# Patient Record
Sex: Female | Born: 1970
Health system: Southern US, Community
[De-identification: ages and names within clinical notes are randomized; demographics above are authoritative.]

## PROBLEM LIST (undated history)

## (undated) DIAGNOSIS — K5792 Diverticulitis of intestine, part unspecified, without perforation or abscess without bleeding: Secondary | ICD-10-CM

## (undated) DIAGNOSIS — G4730 Sleep apnea, unspecified: Secondary | ICD-10-CM

## (undated) DIAGNOSIS — F329 Major depressive disorder, single episode, unspecified: Secondary | ICD-10-CM

## (undated) DIAGNOSIS — R51 Headache: Secondary | ICD-10-CM

## (undated) DIAGNOSIS — R112 Nausea with vomiting, unspecified: Secondary | ICD-10-CM

## (undated) DIAGNOSIS — G709 Myoneural disorder, unspecified: Secondary | ICD-10-CM

## (undated) DIAGNOSIS — N289 Disorder of kidney and ureter, unspecified: Secondary | ICD-10-CM

## (undated) DIAGNOSIS — R0602 Shortness of breath: Secondary | ICD-10-CM

## (undated) DIAGNOSIS — D649 Anemia, unspecified: Secondary | ICD-10-CM

## (undated) DIAGNOSIS — Z9889 Other specified postprocedural states: Secondary | ICD-10-CM

## (undated) DIAGNOSIS — F419 Anxiety disorder, unspecified: Secondary | ICD-10-CM

## (undated) DIAGNOSIS — F32A Depression, unspecified: Secondary | ICD-10-CM

## (undated) DIAGNOSIS — N186 End stage renal disease: Secondary | ICD-10-CM

## (undated) DIAGNOSIS — M1990 Unspecified osteoarthritis, unspecified site: Secondary | ICD-10-CM

## (undated) DIAGNOSIS — K2970 Gastritis, unspecified, without bleeding: Secondary | ICD-10-CM

## (undated) DIAGNOSIS — M65072 Abscess of tendon sheath, left ankle and foot: Secondary | ICD-10-CM

## (undated) DIAGNOSIS — F988 Other specified behavioral and emotional disorders with onset usually occurring in childhood and adolescence: Secondary | ICD-10-CM

## (undated) DIAGNOSIS — J40 Bronchitis, not specified as acute or chronic: Secondary | ICD-10-CM

## (undated) DIAGNOSIS — Z5189 Encounter for other specified aftercare: Secondary | ICD-10-CM

## (undated) DIAGNOSIS — I10 Essential (primary) hypertension: Secondary | ICD-10-CM

## (undated) DIAGNOSIS — J189 Pneumonia, unspecified organism: Secondary | ICD-10-CM

## (undated) DIAGNOSIS — K219 Gastro-esophageal reflux disease without esophagitis: Secondary | ICD-10-CM

## (undated) DIAGNOSIS — R42 Dizziness and giddiness: Secondary | ICD-10-CM

## (undated) DIAGNOSIS — E669 Obesity, unspecified: Secondary | ICD-10-CM

## (undated) DIAGNOSIS — IMO0001 Reserved for inherently not codable concepts without codable children: Secondary | ICD-10-CM

## (undated) HISTORY — DX: Essential (primary) hypertension: I10

## (undated) HISTORY — DX: Major depressive disorder, single episode, unspecified: F32.9

## (undated) HISTORY — DX: Anxiety disorder, unspecified: F41.9

## (undated) HISTORY — DX: Dizziness and giddiness: R42

## (undated) HISTORY — PX: DILATION AND CURETTAGE OF UTERUS: SHX78

## (undated) HISTORY — DX: Depression, unspecified: F32.A

## (undated) HISTORY — DX: Anemia, unspecified: D64.9

## (undated) HISTORY — DX: Headache: R51

## (undated) HISTORY — DX: Sleep apnea, unspecified: G47.30

## (undated) HISTORY — DX: Gastro-esophageal reflux disease without esophagitis: K21.9

## (undated) HISTORY — PX: WISDOM TOOTH EXTRACTION: SHX21

## (undated) HISTORY — DX: Reserved for inherently not codable concepts without codable children: IMO0001

## (undated) HISTORY — DX: Unspecified osteoarthritis, unspecified site: M19.90

## (undated) HISTORY — PX: UPPER GASTROINTESTINAL ENDOSCOPY: SHX188

## (undated) HISTORY — DX: Other specified behavioral and emotional disorders with onset usually occurring in childhood and adolescence: F98.8

---

## 1998-08-30 ENCOUNTER — Ambulatory Visit (HOSPITAL_COMMUNITY): Admission: RE | Admit: 1998-08-30 | Discharge: 1998-08-30 | Payer: Self-pay | Admitting: Family Medicine

## 1998-08-30 ENCOUNTER — Encounter: Payer: Self-pay | Admitting: Family Medicine

## 1998-09-24 ENCOUNTER — Encounter (HOSPITAL_COMMUNITY): Admission: RE | Admit: 1998-09-24 | Discharge: 1998-12-23 | Payer: Self-pay | Admitting: Psychiatry

## 1998-10-09 ENCOUNTER — Inpatient Hospital Stay (HOSPITAL_COMMUNITY): Admission: AD | Admit: 1998-10-09 | Discharge: 1998-10-10 | Payer: Self-pay | Admitting: Specialist

## 1999-12-04 ENCOUNTER — Emergency Department (HOSPITAL_COMMUNITY): Admission: EM | Admit: 1999-12-04 | Discharge: 1999-12-04 | Payer: Self-pay | Admitting: *Deleted

## 1999-12-07 ENCOUNTER — Encounter: Admission: RE | Admit: 1999-12-07 | Discharge: 1999-12-07 | Payer: Self-pay | Admitting: Gastroenterology

## 1999-12-07 ENCOUNTER — Encounter: Payer: Self-pay | Admitting: Gastroenterology

## 1999-12-22 ENCOUNTER — Encounter: Payer: Self-pay | Admitting: Nephrology

## 1999-12-22 ENCOUNTER — Encounter: Admission: RE | Admit: 1999-12-22 | Discharge: 1999-12-22 | Payer: Self-pay | Admitting: Nephrology

## 2000-01-11 ENCOUNTER — Ambulatory Visit: Admission: RE | Admit: 2000-01-11 | Discharge: 2000-01-11 | Payer: Self-pay | Admitting: Family Medicine

## 2000-02-04 ENCOUNTER — Emergency Department (HOSPITAL_COMMUNITY): Admission: EM | Admit: 2000-02-04 | Discharge: 2000-02-04 | Payer: Self-pay

## 2000-02-16 ENCOUNTER — Encounter: Admission: RE | Admit: 2000-02-16 | Discharge: 2000-02-16 | Payer: Self-pay | Admitting: Family Medicine

## 2000-02-16 ENCOUNTER — Encounter: Payer: Self-pay | Admitting: Family Medicine

## 2000-06-06 ENCOUNTER — Encounter: Admission: RE | Admit: 2000-06-06 | Discharge: 2000-06-06 | Payer: Self-pay | Admitting: Orthopedic Surgery

## 2000-06-06 ENCOUNTER — Encounter: Payer: Self-pay | Admitting: Orthopedic Surgery

## 2000-06-13 ENCOUNTER — Encounter: Admission: RE | Admit: 2000-06-13 | Discharge: 2000-06-13 | Payer: Self-pay | Admitting: Orthopedic Surgery

## 2000-06-13 ENCOUNTER — Encounter: Payer: Self-pay | Admitting: Orthopedic Surgery

## 2001-03-27 ENCOUNTER — Encounter: Admission: RE | Admit: 2001-03-27 | Discharge: 2001-03-27 | Payer: Self-pay | Admitting: Orthopedic Surgery

## 2001-03-27 ENCOUNTER — Encounter: Payer: Self-pay | Admitting: Orthopedic Surgery

## 2001-04-01 HISTORY — PX: OTHER SURGICAL HISTORY: SHX169

## 2001-04-11 ENCOUNTER — Encounter: Payer: Self-pay | Admitting: Orthopedic Surgery

## 2001-04-14 ENCOUNTER — Encounter: Payer: Self-pay | Admitting: Orthopedic Surgery

## 2001-04-14 ENCOUNTER — Encounter (INDEPENDENT_AMBULATORY_CARE_PROVIDER_SITE_OTHER): Payer: Self-pay | Admitting: Specialist

## 2001-04-15 ENCOUNTER — Inpatient Hospital Stay (HOSPITAL_COMMUNITY): Admission: EM | Admit: 2001-04-15 | Discharge: 2001-04-17 | Payer: Self-pay | Admitting: Orthopedic Surgery

## 2001-06-10 ENCOUNTER — Encounter: Payer: Self-pay | Admitting: Emergency Medicine

## 2001-06-10 ENCOUNTER — Emergency Department (HOSPITAL_COMMUNITY): Admission: EM | Admit: 2001-06-10 | Discharge: 2001-06-10 | Payer: Self-pay | Admitting: Emergency Medicine

## 2001-10-30 ENCOUNTER — Encounter: Payer: Self-pay | Admitting: Orthopedic Surgery

## 2001-10-30 ENCOUNTER — Encounter: Admission: RE | Admit: 2001-10-30 | Discharge: 2001-10-30 | Payer: Self-pay | Admitting: Orthopedic Surgery

## 2002-06-15 ENCOUNTER — Emergency Department (HOSPITAL_COMMUNITY): Admission: EM | Admit: 2002-06-15 | Discharge: 2002-06-15 | Payer: Self-pay | Admitting: Emergency Medicine

## 2002-06-25 ENCOUNTER — Other Ambulatory Visit: Admission: RE | Admit: 2002-06-25 | Discharge: 2002-06-25 | Payer: Self-pay | Admitting: Obstetrics and Gynecology

## 2002-11-01 HISTORY — PX: SPINE SURGERY: SHX786

## 2002-12-05 ENCOUNTER — Ambulatory Visit: Admission: RE | Admit: 2002-12-05 | Discharge: 2002-12-05 | Payer: Self-pay | Admitting: Internal Medicine

## 2002-12-14 ENCOUNTER — Encounter: Admission: RE | Admit: 2002-12-14 | Discharge: 2002-12-14 | Payer: Self-pay | Admitting: Internal Medicine

## 2002-12-14 ENCOUNTER — Encounter: Payer: Self-pay | Admitting: Internal Medicine

## 2003-09-24 ENCOUNTER — Other Ambulatory Visit: Admission: RE | Admit: 2003-09-24 | Discharge: 2003-09-24 | Payer: Self-pay | Admitting: Obstetrics and Gynecology

## 2004-03-16 ENCOUNTER — Inpatient Hospital Stay (HOSPITAL_COMMUNITY): Admission: AD | Admit: 2004-03-16 | Discharge: 2004-03-20 | Payer: Self-pay | Admitting: Nephrology

## 2004-03-16 ENCOUNTER — Encounter (INDEPENDENT_AMBULATORY_CARE_PROVIDER_SITE_OTHER): Payer: Self-pay | Admitting: *Deleted

## 2004-04-01 HISTORY — PX: RENAL BIOPSY: SHX156

## 2004-04-15 ENCOUNTER — Encounter (INDEPENDENT_AMBULATORY_CARE_PROVIDER_SITE_OTHER): Payer: Self-pay | Admitting: Specialist

## 2004-04-16 ENCOUNTER — Inpatient Hospital Stay (HOSPITAL_COMMUNITY): Admission: RE | Admit: 2004-04-16 | Discharge: 2004-04-17 | Payer: Self-pay | Admitting: Urology

## 2004-07-02 ENCOUNTER — Ambulatory Visit (HOSPITAL_COMMUNITY): Admission: RE | Admit: 2004-07-02 | Discharge: 2004-07-02 | Payer: Self-pay | Admitting: Urology

## 2009-09-08 ENCOUNTER — Ambulatory Visit: Payer: Self-pay | Admitting: Surgery

## 2009-10-22 ENCOUNTER — Ambulatory Visit (HOSPITAL_COMMUNITY): Admission: RE | Admit: 2009-10-22 | Discharge: 2009-10-22 | Payer: Self-pay | Admitting: Surgery

## 2009-10-22 ENCOUNTER — Ambulatory Visit: Payer: Self-pay | Admitting: Vascular Surgery

## 2009-11-28 ENCOUNTER — Ambulatory Visit: Payer: Self-pay | Admitting: Vascular Surgery

## 2009-12-16 ENCOUNTER — Ambulatory Visit: Payer: Self-pay | Admitting: Vascular Surgery

## 2010-06-21 ENCOUNTER — Emergency Department (HOSPITAL_COMMUNITY): Admission: EM | Admit: 2010-06-21 | Discharge: 2010-06-21 | Payer: Self-pay | Admitting: Emergency Medicine

## 2011-01-13 ENCOUNTER — Encounter (INDEPENDENT_AMBULATORY_CARE_PROVIDER_SITE_OTHER): Payer: Medicare Other

## 2011-01-13 ENCOUNTER — Ambulatory Visit (INDEPENDENT_AMBULATORY_CARE_PROVIDER_SITE_OTHER): Payer: Medicare Other | Admitting: Vascular Surgery

## 2011-01-13 DIAGNOSIS — N186 End stage renal disease: Secondary | ICD-10-CM

## 2011-01-13 DIAGNOSIS — Z01810 Encounter for preprocedural cardiovascular examination: Secondary | ICD-10-CM

## 2011-01-14 LAB — CBC
Hemoglobin: 10.9 g/dL — ABNORMAL LOW (ref 12.0–15.0)
MCH: 28.8 pg (ref 26.0–34.0)
RBC: 3.79 MIL/uL — ABNORMAL LOW (ref 3.87–5.11)
RDW: 14.1 % (ref 11.5–15.5)

## 2011-01-14 LAB — COMPREHENSIVE METABOLIC PANEL
ALT: 16 U/L (ref 0–35)
AST: 15 U/L (ref 0–37)
Albumin: 3.1 g/dL — ABNORMAL LOW (ref 3.5–5.2)
BUN: 39 mg/dL — ABNORMAL HIGH (ref 6–23)
Calcium: 9.3 mg/dL (ref 8.4–10.5)
GFR calc non Af Amer: 13 mL/min — ABNORMAL LOW (ref >60)
Total Bilirubin: 0.2 mg/dL — ABNORMAL LOW (ref 0.3–1.2)
Total Protein: 6 g/dL (ref 6.0–8.3)

## 2011-01-14 LAB — PREGNANCY, URINE: Preg Test, Ur: NEGATIVE

## 2011-01-14 LAB — URINALYSIS, ROUTINE W REFLEX MICROSCOPIC
Glucose, UA: 100 mg/dL — AB
Leukocytes, UA: NEGATIVE
Protein, ur: >300 mg/dL — AB

## 2011-01-14 LAB — DIFFERENTIAL
Basophils Absolute: 0 10*3/uL (ref 0.0–0.1)
Basophils Relative: 0 % (ref 0–1)
Neutro Abs: 9.4 10*3/uL — ABNORMAL HIGH (ref 1.7–7.7)
Neutrophils Relative %: 73 % (ref 43–77)

## 2011-01-14 LAB — URINE MICROSCOPIC-ADD ON

## 2011-01-14 NOTE — Assessment & Plan Note (Signed)
OFFICE VISIT  [name], [name] DOB:  [id number]                                       01/13/2011 [id number]  I saw the patient in the office today in consultation for further hemodialysis access.  This is a pleasant 40 year old woman with a history of FSGS and a solitary kidney who is not yet on dialysis.  She had a left radiocephalic fistula placed in December 2010.  She was seen in February 2011, at which time it was noted that her forearm fistula was occluded.  It was felt that there were no options to revise this fistula, and the next recommendation at that time was a left upper arm AV fistula.  I was explained to her that the vein in the upper arm was fairly deep and that she may require secondary procedures in order to make this accessible for access.  She did not want to proceed at that time and was then lost to follow-up.  She was sent back to the office today by Dr. Hassell Done to reconsider a new access.  Of note, she is right- handed.  She has had some recent fatigue but has had no other uremic symptoms. She denies any nausea, vomiting, shortness of breath, or palpitations. It is felt that she will likely require dialysis in the next 2 months.  PAST MEDICAL HISTORY:  Significant for hypertension and chronic kidney disease.  She denies any history of diabetes, hypercholesterolemia, history of previous myocardial infarction, or history congestive heart failure.  She does have morbid obesity.  SOCIAL HISTORY:  She is single.  She has 1 child.  She does not use tobacco.  REVIEW OF SYSTEMS:  CARDIOVASCULAR:  She has had no chest pain, chest pressure, palpitations or arrhythmias. PULMONARY:  She does have occasional wheezing and asthma. HEMATOLOGIC:  She has a history of anemia. She has had no recent uremic symptoms.  Specifically, she denies any nausea, vomiting, palpitations, fatigue, or shortness of breath.  PHYSICAL EXAMINATION:  This is a  pleasant 40 year old woman who appears her stated age.  She is morbidly obese.  Blood pressure is 120/81, heart rate is 71, respiratory rate is 24.  Lungs are clear bilaterally to auscultation.  Cardiovascular:  I do not detect any carotid bruits.  She has a regular rate and rhythm.  She has palpable brachial and radial pulses bilaterally.  Abdomen is obese and difficult to assess.  I do not see a usable forearm cephalic vein or upper arm cephalic vein on either side.  On the left side, her fistula is occluded.  Neurologic:  She has no focal weakness or paresthesias.  I have independently interpreted her vein mapping today, which shows that her upper arm cephalic vein on the left has diameters ranging from 0.61 to 0.54 cm.  Depths that range from 0.78 cm proximally to 3.9 cm distally.  Previous vein mapping I have reviewed, which showed the basilic vein emptied into the brachial system very early; therefore, she is not a candidate for basilic transposition.  We also discussed potentially placing the access in the right arm, but she was not agreeable to consider this at this time.  Based on her vein mapping, I think the next best place to attempt to place a fistula would be the left upper arm, although again, this may require subsequent procedures to superficialize the vein.  She would like 2 days before scheduling her surgery and assures [place] that she will call us back to schedule surgery in the next 2 days.  Once this is a arranged, she will proceed with placement of a left upper arm AV fistula.    Judeth Cornfield. Scot Dock, M.D. Electronically Signed  CSD/MEDQ  D:  01/13/2011  T:  01/14/2011  Job:  [id number]  cc:   Dr. Hassell Done

## 2011-01-20 NOTE — Procedures (Unsigned)
CEPHALIC VEIN MAPPING  INDICATION:  Preop left upper arm AVF cephalic vein mapping.  Failed left forearm AVF.  HISTORY:  EXAM: The right cephalic vein was not evaluated.  The right basilic vein was not evaluated.  The left cephalic vein in the upper arm is compressible.  Diameter measurements range from 0.54 cm to 0.68 cm.  The left basilic vein was not evaluated.  See attached worksheet for all measurements.  IMPRESSION:  Patent left upper arm cephalic vein with diameter measurements as described above.  ___________________________________________ Judeth Cornfield. Scot Dock, M.D.  AS/MEDQ  D:  01/13/2011  T:  01/13/2011  Job:  [id number]

## 2011-01-26 ENCOUNTER — Encounter (HOSPITAL_COMMUNITY): Payer: Medicare Other | Attending: Nephrology

## 2011-01-26 DIAGNOSIS — I120 Hypertensive chronic kidney disease with stage 5 chronic kidney disease or end stage renal disease: Secondary | ICD-10-CM | POA: Insufficient documentation

## 2011-01-26 DIAGNOSIS — N186 End stage renal disease: Secondary | ICD-10-CM | POA: Insufficient documentation

## 2011-01-26 DIAGNOSIS — D649 Anemia, unspecified: Secondary | ICD-10-CM | POA: Insufficient documentation

## 2011-02-01 ENCOUNTER — Encounter (HOSPITAL_COMMUNITY): Payer: Medicare Other | Attending: Nephrology

## 2011-02-01 DIAGNOSIS — N186 End stage renal disease: Secondary | ICD-10-CM | POA: Insufficient documentation

## 2011-02-01 DIAGNOSIS — I120 Hypertensive chronic kidney disease with stage 5 chronic kidney disease or end stage renal disease: Secondary | ICD-10-CM | POA: Insufficient documentation

## 2011-02-01 DIAGNOSIS — D649 Anemia, unspecified: Secondary | ICD-10-CM | POA: Insufficient documentation

## 2011-02-01 LAB — POCT I-STAT 4, (NA,K, GLUC, HGB,HCT)
Glucose, Bld: 157 mg/dL — ABNORMAL HIGH (ref 70–99)
Hemoglobin: 11.2 g/dL — ABNORMAL LOW (ref 12.0–15.0)
Potassium: 3.7 mEq/L (ref 3.5–5.1)

## 2011-03-16 NOTE — Assessment & Plan Note (Signed)
OFFICE VISIT   KU., REPINSKI  DOB:  1971-05-18                                       09/08/2009  [id number]   REASON FOR VISIT:  New access.   HISTORY:  This is a 40 year old female seen at the request of Dr. Hassell Done  for evaluation of permanent access.  The patient has diagnosis of FSGS.  She also has a solitary kidney.  She is not yet on dialysis.  She is  right-handed.  The patient has multiple medical problems, and she  suffers from obesity.  She is also  hypertensive on medications;  however, most recent blood pressures have been in the 180s.  She  continues to have problems with her back.  She has undergone back  surgery for ruptured disk in the past.  She also has problems with  carpal tunnel.  She has had inadequate bilateral carpal tunnel release  according to her.  She does complain of shortness of breath but has not  had any cardiac problems yet.   REVIEW OF SYSTEMS:  GENERAL:  Positive for weight gain.  CARDIAC:  Positive for chest tightness, shortness of breath, shortness  of breath with breath with exertion.  PULMONARY:  Positive for asthma, wheezing.  GI:  Positive for reflux.  GU:  Positive for kidney disease and frequent urination.  NEURO:  Positive for dizziness and headaches.  MUSCULOSKELETAL:  Positive for arthritis, joint pain, muscle pain.  PSYCH:  Positive for depression and anxiety.   All other systems are negative as documented in the encounter form.   FAMILY HISTORY:  Positive for cardiovascular disease in her father.   SOCIAL HISTORY:  She has one child.  She is disabled.  She smokes a half  pack a day.  Does not drink alcohol.   PAST MEDICAL HISTORY:  Significant for hypertension, end-stage renal  disease, gout, sleep apnea, secondary hyperparathyroidism.   ALLERGIES:  Morphine which causes nausea and vomiting as well as Biaxin  which causes nausea and vomiting.   PHYSICAL EXAMINATION:  Heart rate 76, blood  pressure 138/86, temperature  is 98.4.  General:  Well-appearing, in no distress.  HEENT:  Normocephalic, atraumatic.  Pupils equal.  Sclerae anicteric.  Neck:  Supple.  No JVD.  No carotid bruits.  Cardiovascular:  Regular rate and  rhythm.  No murmur.  Pulmonary:  Lungs are clear bilaterally.  Abdomen:  Obese.  Extremities:  She has palpable radial pulse.  Neurologic:  She  is intact.  Skin:  Without rash.  Psych:  She is alert and oriented x3.   DIAGNOSTIC STUDIES:  Vein mapping has been reviewed.  She has an  adequate cephalic vein of the left arm.   ASSESSMENT/PLAN:  End-stage renal disease needing permanent access.   PLAN:  I discussed proceeding with a left radiocephalic fistula.  We  discussed the risks and benefits, including the risks of steal and the  risk of non-maturity requiring additional interventions.  I also told  her that the access is somewhat deep and she may require a procedure to  make this more superficial.  She understands all this.  Her surgery has  been scheduled for Tuesday, September 30, 2009.   Eldridge Abrahams, MD  Electronically Signed   VWB/MEDQ  D:  09/08/2009  T:  09/09/2009  Job:  [id number]  cc:   Maudie Flakes. Hassell Done, M.D.

## 2011-03-16 NOTE — Assessment & Plan Note (Signed)
OFFICE VISIT   [name], [name]  DOB:  1971-05-02                                       11/28/2009  [id number]   Ms. [name] is a 40 year old patient who underwent placement of a left  wrist fistula by Dr. Kellie Simmering approximately 2 weeks ago.  Dr. Donnetta Hutching is the  attending physician at the in clinic.  Ms. [name] returns complaining  of some drainage and redness of her incision at the upper pole.  She  states that she did have the drainage yesterday and it has resolved  today.   PHYSICAL FINDINGS:  Heart rate was 78, blood pressure 165/106.  O2  saturation was 96%.  Her wound is healing very well.  It did appear that  a suture was spitting.  The fistula had a good thrill.   She will maintain her appointment to see Dr. Kellie Simmering in the next 2-3  weeks for evaluation of maturation of her fistula.  She was informed  that it would be okay to put Neosporin on the wound.  She was also  instructed that if the wound be turned red or painful to return to the  office for further evaluation.   Chad Cordial, PA   Rosetta Posner, M.D.  Electronically Signed   KEL/MEDQ  D:  11/28/2009  T:  11/28/2009  Job:  [id number]

## 2011-03-16 NOTE — Assessment & Plan Note (Signed)
OFFICE VISIT   MARLE, WAYCASTER  DOB:  [birth year]                                       12/16/2009  [id number]   The patient is a 40 year old female with end-stage renal disease not on  hemodialysis.  She was evaluated by Dr. Trula Slade for vascular access on  November 8 and Dr. Kellie Simmering performed a left radial artery to cephalic  vein AV fistula (Cimino) shunt on December 22.  Her vein was marginal in  size.  She does have a large arm.  The fistula initially was patent but  has now essentially occluded on physical exam with very low flow  detectable.  I do not think there is any likelihood that this will  mature into a usable access site.  I discussed options with her and the  next recommendation will be a left upper arm AV fistula.  This may turn  out to be too deep and may require secondary procedure if it remains  patent bringing it more superficial.  She does not want to proceed at  the present time.  She will be seeing Dr. Hassell Done in the near future and  will discuss this further with him.     Nelda Severe Kellie Simmering, M.D.  Electronically Signed   JDL/MEDQ  D:  12/16/2009  T:  12/17/2009  Job:  [id number]   cc:   Dr Salem Senate

## 2011-03-16 NOTE — Procedures (Signed)
CEPHALIC VEIN MAPPING   INDICATION:  Left cephalic vein mapping   HISTORY:  Chronic kidney disease.   EXAM:  The right cephalic vein is not evaluated.   The left cephalic vein is compressible.   Diameter measurements range from 0.31 cm to 0.79 cm.   See attached worksheet for all measurements.   IMPRESSION:  Patent left cephalic vein which is of acceptable diameter  for use as a dialysis access site.   ___________________________________________  V. Leia Alf, MD   AS/MEDQ  D:  09/08/2009  T:  09/09/2009  Job:  [id number]
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
## 2011-03-19 NOTE — Discharge Summary (Signed)
NAME:  [name], [name]                         ACCOUNT NO.:  [id number]   MEDICAL RECORD NO.:  [id number]                   PATIENT TYPE:  INP   LOCATION:  5504                                 FACILITY:  [facility]   PHYSICIAN:  Caren Griffins B. Lorrene Reid, M.D.             DATE OF BIRTH:  24-May-1971   DATE OF ADMISSION:  03/16/2004  DATE OF DISCHARGE:  03/20/2004                                 DISCHARGE SUMMARY   ADMISSION DIAGNOSIS:  1. Status post renal biopsy of a solitary right kidney with a large     retroperitoneal and subcapsular bleed.  2. Hypertension.  3. Asthma.  4. Gastroesophageal reflux disease.   DISCHARGE DIAGNOSIS:  1. Status post renal biopsy of a solitary right kidney with a large     retroperitoneal and subcapsular bleed, stable.  2. Hypertension.  3. Asthma.  4. Gastroesophageal reflux disease.   BRIEF HISTORY:  40 year old female with a solitary right kidney is being  admitted now status post renal biopsy complicated by retroperitoneal  subcapsular bleed.  The patient has chronic kidney disease with a creatinine  of 1.5 on Mar 05, 2004.  She has a history of hypertension, asthma, gout,  vitiligo, and has been followed in our [place] office with proteinuria.   LABORATORY DATA:  On admission, pre-biopsy hemoglobin 12.3, post biopsy  10.5, white count 14,400, platelets 398,000.  Sodium 136, potassium 3.7,  chloride 109, CO2 22, glucose 149, BUN 22, creatinine 1.5, calcium 8.3.   HOSPITAL COURSE:  The patient was admitted and transfused 1 unit of packed  red blood cells.  Her hemoglobins were monitored closely and 24 hours after  biopsy, her hemoglobin was 9.6.  It very slowly dropped to a low of 9 on the  day of discharge four days later.  She had some mild mid flank discomfort.  She was maintained at bedrest.  By May 18, her urine was clear.  She was  having right sided back pain.  Her vital signs remained  stable throughout.  She was allowed to ambulate within her  hospital room.  With ambulation, her  hemoglobin did not have any significant drops.  She was given analgesics for  her pain.  She was discharged in stable condition to resume her Norvasc,  follow her blood pressures at home, follow up labs in one week, and to see  gynecologist for a known left ovarian cyst.  She was instructed to stay away  from aspirin and non-steroidal anti-inflammatory drugs.   DISCHARGE MEDICATIONS:  Lexapro 20 mg daily, Singular 10 mg daily, Zantac 75  mg b.i.d., Topamax 25 mg daily, Allopurinol 100 mg daily, Albuterol inhaler  2 puffs q.i.d. as needed, Norvasc 5 mg daily, Darvocet N100, 1-2 pills every  6 hours as needed for pain, [place], hydrochlorothiazide 32/12.5 mg 1 daily.   Hemoglobin at the time of discharge was 9, blood pressure 114/81.      Velva Harman  R. Kilroy, P.A.                      Elzie Rings Lorrene Reid, M.D.    RRK/MEDQ  D:  05/01/2004  T:  05/01/2004  Job:  [id number]

## 2011-03-19 NOTE — Discharge Summary (Signed)
[facility]  Patient:    Sarah, Barnett                      MRN: [id number] Adm. Date:  [id number] Disc. Date: 04/17/01 Attending:  Tarri Glenn Page Dictator:   Jenetta Loges, P.A.                           Discharge Summary  ADMISSION DIAGNOSES: 1. Herniated nucleus pulposus, L5-7 on the left. 2. Sleep apnea/asthma. 3. Obesity. 4. Tobacco dependency. 5. Hypertension. 6. Gastroesophageal reflux disease. 7. Congenital deformity with only one kidney.  DISCHARGE DIAGNOSES:  1. Status post microdiskectomy, L5-S1 on the left.  2. Postoperative nausea and vomiting.  3. Postoperative headache.  4. Herniated nucleus pulposus, L5-7 on the left.  5. Sleep apnea/asthma.  6. Obesity.  7. Tobacco dependency.  8. Hypertension.  9. Gastroesophageal reflux disease. 10. Congenital deformity with only one kidney.  BRIEF HISTORY:  Ms. [name] is a 40 year old female who has had continued problems refractory to conservative care for HNP L5-S1 on the left.  She had epidural steroids without relief.  At this time, surgical decompression is indicated.  The risks and benefits were discussed and she was in agreement with procedure.  HOSPITAL COURSE:  The patient was admitted and underwent the above mentioned procedure and tolerated this well.  All appropriate IV antibiotics and analgesics were provided.  Postoperatively, the patient had nausea and vomiting requiring continued IV fluids and IV medicines.  On postoperative day #2, she had a migraine headache requiring Imitrex.  She had good relief with this and by postoperative day #3, she was ambulating in the halls without any nausea or vomiting.  Her headache was relieved.  She was voiding without difficulties.  She was afebrile and neurovascularly she was intact.  Her leg pain was relieved.  She had back pain as was expected with surgery.  At this time, she was felt medically and orthopedically stable  for discharge to home.  LABORATORY DATA ON ADMISSION:  Admission BMET normal.  H&H normal. Chemistries normal, except for glucose of 121 which was not fasting.  Intraoperative localization at appropriate level.  Chest film revealed no active disease on admission.  Cardiology shows EKG with normal sinus rhythm.  CONDITION ON DISCHARGE:  Stable and improved.  DISCHARGE MEDICATIONS:  In discharge instructions.  DISCHARGE INSTRUCTIONS:  The patient is being discharged to home.  She may shower facing the water and reapply clean dry dressings.  She is to follow up two weeks postoperatively.  Resume home medications and home diet.  Aleve over-the-counter b.i.d. Prescriptions for Percocet #40 1-2 q.4-6h. p.r.n. pain 5 mg and Flexeril 10 mg 1 q.8h. p.r.n. spasm.  Over-the-counter stool softener.  BACK PRECAUTIONS:  She may ambulate ad lib, but no lifting.  She is to call for a followup appointment. DD:  04/17/01 TD:  04/17/01 Job: [id number]

## 2011-03-19 NOTE — H&P (Signed)
NAME:  [name], [name]                         ACCOUNT NO.:  [id number]   MEDICAL RECORD NO.:  [id number]                   PATIENT TYPE:  OIB   LOCATION:  2550                                 FACILITY:  [facility]   PHYSICIAN:  Caren Griffins B. Lorrene Reid, M.D.             DATE OF BIRTH:  12-23-1970   DATE OF ADMISSION:  03/16/2004  DATE OF DISCHARGE:                                HISTORY & PHYSICAL   REASON FOR ADMISSION:  Post renal biopsy retroperitoneal bleed.   HISTORY OF PRESENT ILLNESS:  Dominga is a 40 year old white female who has  been followed in our [place], [place], office with a prior history  of hypertension and a solitary right kidney with an atrophic left kidney.  She has had longstanding proteinuria with CKD (serum creatinine 1.3-1.5) and  24-hour urine collections with anywhere between 2.2 g of protein for 24  hours to as high as 5 g of protein per 24 hours in March of 2005.   She was, because of  her nephrotic range proteinuria, felt to require a  diagnostic renal biopsy, but because of her morbid obesity and her solitary  kidney, she was referred to radiology for CT-guided biopsy.  This was done  today with prebiopsy coagulation parameters which were normal (PT INR was  0.9 and PTT 22) and a prebiopsy hemoglobin of 12.3.   Following the biopsy, she developed the onset of right flank, back, and  lower quadrant abdominal pain.  Limited CT of the kidney revealed a sizeable  retroperitoneal bleed, as well as a subcapsular hematoma.  The majority of  the blood was felt to be retroperitoneal and was displacing the kidney.  The  blood pressure dropped from around [id number] systolic to [id number] systolic.  The  hemoglobin, which had been 12.3 prior to the biopsy, dropped to 10.5  acutely.  She was therefore admitted for observation.   She takes several medications for blood pressure, specifically Atacand 32 mg  a day, as well as Atacand HCT 32/12.5 and Norvasc 5 mg a day, all of which  she took this morning for baseline blood pressures that she states run in  the [id number] systolic range.   PAST MEDICAL HISTORY:  1. Solitary right kidney with atrophic left kidney and CKD with nephrotic     proteinuria as outlined above.  2. Hypertension.  3. Migraine headaches.  4. Allergies/asthma.  5. Gout.  6. History of diverticulitis.  7. Gastritis and acid reflux.  8. Morbid obesity.  9. History of anxiety and depression.  10.      History of a prior C-section.   CURRENT MEDICINES:  1. Atacand HCT 32/12.5 mg and Atacand 32 mg a day.  2. Zantac over the counter daily to b.i.d. (question 75 mg).  3. Mucinex 600 mg one to two b.i.d.  4. Singulair 10 mg a day.  5. Albuterol inhaler as needed.  6. Lexapro 20 mg  a day.  7. Allopurinol 100 mg a day.  8. Topamax 25 mg a day.   ALLERGIES:  BIAXIN causes nausea.  She is not tolerate to AMOXICILLIN.  MORPHINE causes nausea.   FAMILY HISTORY:  Positive for hypertension, diabetes, coronary disease,  stroke, and colon and gallbladder cancer.  There is no family history of  renal disease.   SOCIAL HISTORY:  She is divorced from her first spouse and is engaged to be  remarried in October of this year.  She has a preteen child.  She smokes a  pack a day of cigarettes and occasionally drinks.   REVIEW OF SYSTEMS:  Positive for intermittent shortness of breath, wheezing,  and intermittent cough.  She presently has fairly severe back, flank, and  right lower quadrant abdominal pain.  She denies swelling of her feet and  legs.  She has snoring and excessive daytime sleepiness and uses CPAP at  night for this (sleep apnea).  She has a prior history of some gouty attack,  but is not presently complaining of any gouty-type symptoms.   PHYSICAL EXAMINATION:  GENERAL APPEARANCE:  She is an overweight and  obviously uncomfortable white female.  VITAL SIGNS:  The blood pressure was 100/50.  Heart rate in the 70s and  regular.  HEENT:   Unremarkable.  LUNGS:  Clear.  CARDIAC:  S1 and S2.  No S3 or S4.  ABDOMEN:  Obese with a large panniculus and very large skin folds.  There  was no visible hematoma, but she was extremely tender in the right lower  quadrant and right flank regions.  The left lower quadrant was nontender.  EXTREMITIES:  Free of edema.   LABORATORY DATA:  Prebiopsy hemoglobin 12.3, hematocrit 36.6, platelets  304,000, WBC 7300.  PT INR 0.8, PTT 27.  Post biopsy hemoglobin 10.5,  hematocrit 30.9, WBC 14,400, platelets 398,000.  INR 1.9, PTT 22.  Sodium  138, potassium 3.7, chloride 109, CO2 22, BUN 22, creatinine 1.5, calcium  8.3, glucose 149.   IMPRESSION:  A 40 year old, morbidly obese, white female with:  1. __________ with nephrotic range proteinuria.  2. Retroperitoneal and subcapsular bleed post renal biopsy of a solitary     right kidney with a 2 g hemoglobin variation secondary to blood loss.  3. Hypertension now hypotension secondary to retroperitoneal and subcapsular     bleed.  4. Gout.  5. Sleep apnea.  6. Morbid obesity.  7. Asthma/allergies.  8. Other problems as noted above.   PLAN:  1. Will admit for observation.  2. Obtain q.6h. CBC and renal profile.  3. Hold antihypertensive medications.  4. Clear liquid diet only should some sort of intervention or procedure be     required to stop bleeding.  5. Strict bedrest.  6. Foley catheter.  7. No aspirin or nonsteroidals.  8. Pain medications (IV Dilaudid/p.o. Vicodin).  9. Vital signs q.2h. and watch for further hypotension.  10.      Transfuse as dictated by hemoglobins.  If the hemoglobin does not     drop any further, will hold off on transfusion for now.                                                Elzie Rings Lorrene Reid, M.D.    CBD/MEDQ  D:  03/16/2004  T:  03/16/2004  Job:  [id number] 

## 2011-03-19 NOTE — Discharge Summary (Signed)
[facility]  Patient:    [name], [name]                      MRN: [id number] Adm. Date:  [id number] Attending:  Tarri Glenn Page                           Discharge Summary  No dictation. DD:  04/17/01 TD:  04/17/01 Job: [place]

## 2011-03-19 NOTE — Op Note (Signed)
NAME:  [name], [name]                         ACCOUNT NO.:  [id number]   MEDICAL RECORD NO.:  [id number]                   PATIENT TYPE:  AMB   LOCATION:  DAY                                  FACILITY:  [facility]   PHYSICIAN:  Domingo Pulse, M.D.               DATE OF BIRTH:  September 08, 1971   DATE OF PROCEDURE:  04/15/2004  DATE OF DISCHARGE:                                 OPERATIVE REPORT   PREOPERATIVE DIAGNOSIS:  Proteinuria with renal insufficiency.   POSTOPERATIVE DIAGNOSIS:  Proteinuria with renal insufficiency.   OPERATION/PROCEDURE:  Hand-assisted laparoscopic renal biopsy.   SURGEON:  Domingo Pulse, M.D.   ASSISTANT:  1. Bernestine Amass, M.D.  2. Dorie Rank, M.D.   ANESTHESIA:  General.   COMPLICATIONS:  None.   DRAINS:  A Foley catheter and an NG tube intraoperatively.   HISTORY:  This 40 year old female has had rising creatinine with associated  proteinuria and poorly controlled hypertension.  The patient has undergone  imaging studies which demonstrated an atrophic left kidney, most likely due  to congenital anomaly.  The patient's right kidney was biopsied in  radiology.  Unfortunately, the biopsy was not diagnostic as primary  medullary tissue was obtained as opposed to cortical tissue.  In addition,  the patient had problems with a posterior biopsy hematoma.  For that reason,  the radiologist has been unwilling to consider repeat biopsy and the  nephrologists are certainly anxious about doing a blind biopsy in a patient  with only a solitary kidney.  For that reason, the request was made for the  patient to undergo a laparoscopic biopsy.  Given the fact that the patient  has just developed a large hematoma and is in excess of 300 pounds, it was  felt that the patient should undergo a hand-assisted laparoscopic procedure  in order to allow the hematoma to be evaluated and for improved safety of  the biopsy procedure.  The patient understands the risks  and benefits of the  procedure and gave full informed consent.  The patient was told there was  approximately 20% chance that she would have to be converted to an open  procedure.  In addition, the findings on preoperative CT were discussed with  the patient prior to surgery.  This includes the fact that there was a  possible lesion on the lower pole of her left kidney.  She was told that we  would not proceed with biopsy and/or nephrectomy on the left-hand side until  we had determined the status of the right kidney insofar as renal function  is concerned.  The patient will require additional dilation with a MRI since  it is not felt that she is safe for IV contrast and we can make decisions on  the fate of the left kidney once the biopsy report has been obtained and the  MRI has been completed.  The patient  gave full informed consent for the hand-  assisted laparoscopic renal biopsy.   DESCRIPTION OF PROCEDURE:  After successful induction of general anesthesia,  the patient was placed in the supine position, prepped with Betadine and  draped in the usual sterile fashion.   A 7 cm incision was made in the abdomen just above the left umbilicus.  Incision was made and carried down through the subcutaneous tissue, down to  the rectus sheath.  The rectus sheath was opened and the peritoneum was  entered.  The GelPort was then placed with a hand in the abdomen.  Additional ports were placed.  One was placed in the midclavicular line on  the right-hand side.  The other was placed in the midline just above the  GelPort.  The operating instruments were placed into the midline incision  and the camera was placed in through the midclavicular line.  An adequate  insufflation was obtained.  Later on the procedure, a small fibroid was  placed more laterally to aid in traction.  The Harmonic scalpel was used to  take down the white line of Toldt and the colon was retracted medially.  The  kidney  could be grasped but it was clear that there was a large hematoma  surrounding the kidney.  The hematoma was opened but it was very difficult  to dissect out the lower pole.  Decision was made to proceed further up the  kidney, more towards the mid pole and upper pole which showed much less  hematoma on the recent CT.  In the mid pole area, the hematoma was removed  from around the kidney.  The capsule was opened.  A small piece of  parenchyma was sent over to pathology.  This had been excised sharply.  The  specimen was carefully evaluated by pathology and they stated this was  indeed renal cortex and an adequate piece was available for pathologic  evaluation.   The area was then treated with a wedge of cautery.  A wall with a small pack  of Surgicel with FloSeal.  The patient had a small piece of Gelfoam placed  over top and it seemed that adequate hemostasis had been obtained.  Another  piece of Gelfoam was placed around the lower pole where the hematoma had  been evacuated in that area initially explored. The colon was returned back  into its appropriate position.  The abdomen was exited in a standard  fashion.  The ports were removed.  No bleeding was seen.  The fascia on the  midline port was then closed with some difficulty due to her body habitus.  A running suture of #1 PDS was utilized.  The ports were all then closed  with surgical clips.  The patient tolerated the procedure well and was taken  to the recovery room in good condition.                                               Domingo Pulse, M.D.    RJE/MEDQ  D:  04/15/2004  T:  04/15/2004  Job:  [id number]   cc:   Windy Kalata, M.D.  [address]  [place]  [place] [address]  Fax: [phone]

## 2011-04-29 ENCOUNTER — Encounter (HOSPITAL_COMMUNITY)
Admission: RE | Admit: 2011-04-29 | Discharge: 2011-04-29 | Disposition: A | Discharge status: Home / Self Care | Payer: Medicare Other | Source: Ambulatory Visit | Attending: Nephrology | Admitting: Nephrology

## 2011-04-29 ENCOUNTER — Encounter (HOSPITAL_COMMUNITY): Payer: Medicare Other

## 2011-04-29 ENCOUNTER — Other Ambulatory Visit: Payer: Self-pay | Admitting: Nephrology

## 2011-04-29 DIAGNOSIS — I120 Hypertensive chronic kidney disease with stage 5 chronic kidney disease or end stage renal disease: Secondary | ICD-10-CM | POA: Insufficient documentation

## 2011-04-29 DIAGNOSIS — D649 Anemia, unspecified: Secondary | ICD-10-CM | POA: Insufficient documentation

## 2011-04-29 DIAGNOSIS — N186 End stage renal disease: Secondary | ICD-10-CM | POA: Insufficient documentation

## 2011-05-07 ENCOUNTER — Encounter (HOSPITAL_COMMUNITY): Payer: Medicare Other

## 2011-05-13 ENCOUNTER — Other Ambulatory Visit: Payer: Self-pay | Admitting: Nephrology

## 2011-05-13 ENCOUNTER — Encounter (HOSPITAL_COMMUNITY): Payer: Medicare Other | Attending: Nephrology

## 2011-05-13 DIAGNOSIS — I120 Hypertensive chronic kidney disease with stage 5 chronic kidney disease or end stage renal disease: Secondary | ICD-10-CM | POA: Insufficient documentation

## 2011-05-13 DIAGNOSIS — D649 Anemia, unspecified: Secondary | ICD-10-CM | POA: Insufficient documentation

## 2011-05-13 DIAGNOSIS — N186 End stage renal disease: Secondary | ICD-10-CM | POA: Insufficient documentation

## 2011-05-13 LAB — RENAL FUNCTION PANEL
Albumin: 3.5 g/dL (ref 3.5–5.2)
BUN: 88 mg/dL — ABNORMAL HIGH (ref 6–23)
Chloride: 102 mEq/L (ref 96–112)
Creatinine, Ser: 5.96 mg/dL — ABNORMAL HIGH (ref 0.50–1.10)
Glucose, Bld: 166 mg/dL — ABNORMAL HIGH (ref 70–99)
Potassium: 4 mEq/L (ref 3.5–5.1)

## 2011-05-14 LAB — PTH, INTACT AND CALCIUM: PTH: 25.4 pg/mL (ref 14.0–72.0)

## 2011-06-02 DIAGNOSIS — Z5189 Encounter for other specified aftercare: Secondary | ICD-10-CM

## 2011-06-02 HISTORY — DX: Encounter for other specified aftercare: Z51.89

## 2011-06-09 ENCOUNTER — Encounter (HOSPITAL_COMMUNITY)
Admission: RE | Admit: 2011-06-09 | Discharge: 2011-06-09 | Disposition: A | Discharge status: Home / Self Care | Payer: Medicare Other | Source: Ambulatory Visit | Attending: Vascular Surgery | Admitting: Vascular Surgery

## 2011-06-09 ENCOUNTER — Ambulatory Visit (HOSPITAL_COMMUNITY)
Admission: RE | Admit: 2011-06-09 | Discharge: 2011-06-09 | Disposition: A | Discharge status: Home / Self Care | Payer: Medicare Other | Source: Ambulatory Visit | Attending: Vascular Surgery | Admitting: Vascular Surgery

## 2011-06-09 ENCOUNTER — Other Ambulatory Visit: Payer: Self-pay | Admitting: Vascular Surgery

## 2011-06-09 DIAGNOSIS — Z01810 Encounter for preprocedural cardiovascular examination: Secondary | ICD-10-CM | POA: Insufficient documentation

## 2011-06-09 DIAGNOSIS — N186 End stage renal disease: Secondary | ICD-10-CM

## 2011-06-09 DIAGNOSIS — Z01818 Encounter for other preprocedural examination: Secondary | ICD-10-CM | POA: Insufficient documentation

## 2011-06-09 DIAGNOSIS — Z01812 Encounter for preprocedural laboratory examination: Secondary | ICD-10-CM | POA: Insufficient documentation

## 2011-06-09 LAB — CBC
HCT: 24.1 % — ABNORMAL LOW (ref 36.0–46.0)
Hemoglobin: 8 g/dL — ABNORMAL LOW (ref 12.0–15.0)
MCHC: 33.2 g/dL (ref 30.0–36.0)
RBC: 2.65 MIL/uL — ABNORMAL LOW (ref 3.87–5.11)

## 2011-06-09 LAB — BASIC METABOLIC PANEL
BUN: 95 mg/dL — ABNORMAL HIGH (ref 6–23)
CO2: 18 mEq/L — ABNORMAL LOW (ref 19–32)
Calcium: 10.2 mg/dL (ref 8.4–10.5)
Creatinine, Ser: 6.64 mg/dL — ABNORMAL HIGH (ref 0.50–1.10)
GFR calc Af Amer: 8 mL/min — ABNORMAL LOW (ref >60)
GFR calc non Af Amer: 7 mL/min — ABNORMAL LOW (ref >60)
Glucose, Bld: 174 mg/dL — ABNORMAL HIGH (ref 70–99)
Potassium: 4.2 mEq/L (ref 3.5–5.1)
Sodium: 138 mEq/L (ref 135–145)

## 2011-06-09 LAB — URINALYSIS, ROUTINE W REFLEX MICROSCOPIC
Glucose, UA: NEGATIVE mg/dL
Nitrite: NEGATIVE
Protein, ur: 100 mg/dL — AB
Specific Gravity, Urine: 1.016 (ref 1.005–1.030)
Urobilinogen, UA: 0.2 mg/dL (ref 0.0–1.0)
pH: 5 (ref 5.0–8.0)

## 2011-06-10 ENCOUNTER — Ambulatory Visit (HOSPITAL_COMMUNITY)
Admission: RE | Admit: 2011-06-10 | Discharge: 2011-06-10 | Disposition: A | Discharge status: Home / Self Care | Payer: Medicare Other | Source: Ambulatory Visit | Attending: Vascular Surgery | Admitting: Vascular Surgery

## 2011-06-10 DIAGNOSIS — F17200 Nicotine dependence, unspecified, uncomplicated: Secondary | ICD-10-CM | POA: Insufficient documentation

## 2011-06-10 DIAGNOSIS — E669 Obesity, unspecified: Secondary | ICD-10-CM | POA: Insufficient documentation

## 2011-06-10 DIAGNOSIS — N189 Chronic kidney disease, unspecified: Secondary | ICD-10-CM | POA: Insufficient documentation

## 2011-06-10 DIAGNOSIS — I120 Hypertensive chronic kidney disease with stage 5 chronic kidney disease or end stage renal disease: Secondary | ICD-10-CM

## 2011-06-10 DIAGNOSIS — N186 End stage renal disease: Secondary | ICD-10-CM

## 2011-06-10 DIAGNOSIS — I129 Hypertensive chronic kidney disease with stage 1 through stage 4 chronic kidney disease, or unspecified chronic kidney disease: Secondary | ICD-10-CM | POA: Insufficient documentation

## 2011-06-10 DIAGNOSIS — K219 Gastro-esophageal reflux disease without esophagitis: Secondary | ICD-10-CM | POA: Insufficient documentation

## 2011-06-10 DIAGNOSIS — Z01812 Encounter for preprocedural laboratory examination: Secondary | ICD-10-CM | POA: Insufficient documentation

## 2011-06-10 DIAGNOSIS — J45909 Unspecified asthma, uncomplicated: Secondary | ICD-10-CM | POA: Insufficient documentation

## 2011-06-10 HISTORY — PX: AV FISTULA PLACEMENT: SHX1204

## 2011-06-10 LAB — DIFFERENTIAL
Basophils Absolute: 0 10*3/uL (ref 0.0–0.1)
Basophils Relative: 0 % (ref 0–1)
Eosinophils Absolute: 0.1 10*3/uL (ref 0.0–0.7)
Eosinophils Relative: 0 % (ref 0–5)
Lymphocytes Relative: 19 % (ref 12–46)
Monocytes Absolute: 0.6 10*3/uL (ref 0.1–1.0)
Monocytes Relative: 4 % (ref 3–12)

## 2011-06-10 LAB — CBC
HCT: 23.1 % — ABNORMAL LOW (ref 36.0–46.0)
Hemoglobin: 7.5 g/dL — ABNORMAL LOW (ref 12.0–15.0)
RBC: 2.52 MIL/uL — ABNORMAL LOW (ref 3.87–5.11)
WBC: 14.1 10*3/uL — ABNORMAL HIGH (ref 4.0–10.5)

## 2011-06-10 LAB — GLUCOSE, CAPILLARY

## 2011-06-11 LAB — POCT I-STAT 4, (NA,K, GLUC, HGB,HCT)
Glucose, Bld: 206 mg/dL — ABNORMAL HIGH (ref 70–99)
HCT: 23 % — ABNORMAL LOW (ref 36.0–46.0)
Sodium: 136 mEq/L (ref 135–145)

## 2011-06-15 ENCOUNTER — Inpatient Hospital Stay (HOSPITAL_COMMUNITY)
Admission: EM | Admit: 2011-06-15 | Discharge: 2011-06-19 | DRG: 683 | Disposition: A | Discharge status: Home / Self Care | Payer: Medicare Other | Source: Ambulatory Visit | Attending: Family Medicine | Admitting: Family Medicine

## 2011-06-15 ENCOUNTER — Emergency Department (HOSPITAL_COMMUNITY): Payer: Medicare Other

## 2011-06-15 DIAGNOSIS — G43909 Migraine, unspecified, not intractable, without status migrainosus: Secondary | ICD-10-CM | POA: Diagnosis present

## 2011-06-15 DIAGNOSIS — G4733 Obstructive sleep apnea (adult) (pediatric): Secondary | ICD-10-CM | POA: Diagnosis present

## 2011-06-15 DIAGNOSIS — F3289 Other specified depressive episodes: Secondary | ICD-10-CM | POA: Diagnosis present

## 2011-06-15 DIAGNOSIS — N185 Chronic kidney disease, stage 5: Secondary | ICD-10-CM | POA: Diagnosis present

## 2011-06-15 DIAGNOSIS — E872 Acidosis, unspecified: Secondary | ICD-10-CM | POA: Diagnosis present

## 2011-06-15 DIAGNOSIS — F329 Major depressive disorder, single episode, unspecified: Secondary | ICD-10-CM | POA: Diagnosis present

## 2011-06-15 DIAGNOSIS — F17200 Nicotine dependence, unspecified, uncomplicated: Secondary | ICD-10-CM | POA: Diagnosis present

## 2011-06-15 DIAGNOSIS — I120 Hypertensive chronic kidney disease with stage 5 chronic kidney disease or end stage renal disease: Secondary | ICD-10-CM | POA: Diagnosis present

## 2011-06-15 DIAGNOSIS — Z79899 Other long term (current) drug therapy: Secondary | ICD-10-CM

## 2011-06-15 DIAGNOSIS — M109 Gout, unspecified: Secondary | ICD-10-CM | POA: Diagnosis present

## 2011-06-15 DIAGNOSIS — F411 Generalized anxiety disorder: Secondary | ICD-10-CM | POA: Diagnosis present

## 2011-06-15 DIAGNOSIS — N949 Unspecified condition associated with female genital organs and menstrual cycle: Secondary | ICD-10-CM | POA: Diagnosis present

## 2011-06-15 DIAGNOSIS — K5900 Constipation, unspecified: Secondary | ICD-10-CM | POA: Diagnosis not present

## 2011-06-15 DIAGNOSIS — D631 Anemia in chronic kidney disease: Secondary | ICD-10-CM | POA: Diagnosis present

## 2011-06-15 DIAGNOSIS — N938 Other specified abnormal uterine and vaginal bleeding: Secondary | ICD-10-CM | POA: Diagnosis present

## 2011-06-15 DIAGNOSIS — Z8614 Personal history of Methicillin resistant Staphylococcus aureus infection: Secondary | ICD-10-CM

## 2011-06-15 DIAGNOSIS — N179 Acute kidney failure, unspecified: Principal | ICD-10-CM | POA: Diagnosis present

## 2011-06-15 DIAGNOSIS — D500 Iron deficiency anemia secondary to blood loss (chronic): Secondary | ICD-10-CM | POA: Diagnosis present

## 2011-06-15 DIAGNOSIS — Z6841 Body Mass Index (BMI) 40.0 and over, adult: Secondary | ICD-10-CM

## 2011-06-15 LAB — COMPREHENSIVE METABOLIC PANEL
ALT: 9 U/L (ref 0–35)
Calcium: 9.6 mg/dL (ref 8.4–10.5)
Creatinine, Ser: 8.95 mg/dL — ABNORMAL HIGH (ref 0.50–1.10)
GFR calc Af Amer: 6 mL/min — ABNORMAL LOW (ref >60)
Glucose, Bld: 128 mg/dL — ABNORMAL HIGH (ref 70–99)
Sodium: 138 mEq/L (ref 135–145)
Total Protein: 6.6 g/dL (ref 6.0–8.3)

## 2011-06-15 LAB — CBC
HCT: 20.5 % — ABNORMAL LOW (ref 36.0–46.0)
MCH: 30.6 pg (ref 26.0–34.0)
MCHC: 33.2 g/dL (ref 30.0–36.0)
RDW: 15.4 % (ref 11.5–15.5)

## 2011-06-15 LAB — URINALYSIS, ROUTINE W REFLEX MICROSCOPIC
Leukocytes, UA: NEGATIVE
Nitrite: NEGATIVE
Protein, ur: 30 mg/dL — AB
Specific Gravity, Urine: 1.018 (ref 1.005–1.030)
Urobilinogen, UA: 0.2 mg/dL (ref 0.0–1.0)

## 2011-06-15 LAB — CK TOTAL AND CKMB (NOT AT ARMC)
CK, MB: 1.9 ng/mL (ref 0.3–4.0)
Relative Index: INVALID (ref 0.0–2.5)

## 2011-06-15 LAB — LIPASE, BLOOD: Lipase: 50 U/L (ref 11–59)

## 2011-06-15 LAB — URINE MICROSCOPIC-ADD ON

## 2011-06-15 LAB — DIFFERENTIAL
Basophils Absolute: 0 10*3/uL (ref 0.0–0.1)
Basophils Relative: 0 % (ref 0–1)
Eosinophils Relative: 0 % (ref 0–5)
Monocytes Absolute: 0.4 10*3/uL (ref 0.1–1.0)

## 2011-06-15 LAB — PROTIME-INR: INR: 1.03 (ref 0.00–1.49)

## 2011-06-15 LAB — TROPONIN I: Troponin I: <0.3 ng/mL (ref <0.30)

## 2011-06-15 LAB — APTT: aPTT: 30 seconds (ref 24–37)

## 2011-06-16 ENCOUNTER — Inpatient Hospital Stay (HOSPITAL_COMMUNITY): Payer: Medicare Other

## 2011-06-16 ENCOUNTER — Other Ambulatory Visit (HOSPITAL_COMMUNITY): Payer: Medicare Other

## 2011-06-16 DIAGNOSIS — M79609 Pain in unspecified limb: Secondary | ICD-10-CM

## 2011-06-16 LAB — CBC
HCT: 22.4 % — ABNORMAL LOW (ref 36.0–46.0)
Hemoglobin: 7.4 g/dL — ABNORMAL LOW (ref 12.0–15.0)
MCH: 30.3 pg (ref 26.0–34.0)
MCH: 30.3 pg (ref 26.0–34.0)
MCHC: 33 g/dL (ref 30.0–36.0)
MCV: 91.8 fL (ref 78.0–100.0)
Platelets: 236 10*3/uL (ref 150–400)
RBC: 2.44 MIL/uL — ABNORMAL LOW (ref 3.87–5.11)
RDW: 15.4 % (ref 11.5–15.5)
WBC: 10.1 10*3/uL (ref 4.0–10.5)

## 2011-06-16 LAB — GLUCOSE, CAPILLARY
Glucose-Capillary: 128 mg/dL — ABNORMAL HIGH (ref 70–99)
Glucose-Capillary: 131 mg/dL — ABNORMAL HIGH (ref 70–99)

## 2011-06-16 LAB — BASIC METABOLIC PANEL
BUN: 100 mg/dL — ABNORMAL HIGH (ref 6–23)
CO2: 14 mEq/L — ABNORMAL LOW (ref 19–32)
CO2: 16 mEq/L — ABNORMAL LOW (ref 19–32)
Calcium: 9.1 mg/dL (ref 8.4–10.5)
Calcium: 8.8 mg/dL (ref 8.4–10.5)
Creatinine, Ser: 8.52 mg/dL — ABNORMAL HIGH (ref 0.50–1.10)
GFR calc Af Amer: 6 mL/min — ABNORMAL LOW (ref >60)
GFR calc non Af Amer: 5 mL/min — ABNORMAL LOW (ref >60)
Glucose, Bld: 125 mg/dL — ABNORMAL HIGH (ref 70–99)
Sodium: 135 mEq/L (ref 135–145)

## 2011-06-16 LAB — VITAMIN B12: Vitamin B-12: 1462 pg/mL — ABNORMAL HIGH (ref 211–911)

## 2011-06-16 LAB — CARDIAC PANEL(CRET KIN+CKTOT+MB+TROPI)
CK, MB: 2 ng/mL (ref 0.3–4.0)
Total CK: 102 U/L (ref 7–177)

## 2011-06-16 LAB — IRON AND TIBC
Iron: 55 ug/dL (ref 42–135)
TIBC: 245 ug/dL — ABNORMAL LOW (ref 250–470)
UIBC: 190 ug/dL

## 2011-06-16 LAB — HEMOGLOBIN A1C: Hgb A1c MFr Bld: 7.1 % — ABNORMAL HIGH (ref <5.7)

## 2011-06-16 NOTE — Op Note (Signed)
  NAMEBERNELDA, BYNOG        ACCOUNT NO.:  [id number]  MEDICAL RECORD NO.:  [id number]  LOCATION:  SDSC                         FACILITY:  [facility]  PHYSICIAN:  Judeth Cornfield. Scot Dock, M.D.DATE OF BIRTH:  1971-01-17  DATE OF PROCEDURE:  06/10/2011 DATE OF DISCHARGE:  06/10/2011                              OPERATIVE REPORT   PREOPERATIVE DIAGNOSIS:  Chronic kidney disease.  POSTOPERATIVE DIAGNOSIS:  Chronic kidney disease.  PROCEDURE:  Placement of new left brachiocephalic AV fistula.  SURGEON:  Judeth Cornfield. Scot Dock, MD  ASSISTANT:  Evorn Gong, PA  ANESTHESIA:  General.  TECHNIQUE:  The patient was taken to the operating room and sedated by Anesthesia.  The left upper extremity was prepped and draped in usual sterile fashion.  After the skin was infiltrated with 1% lidocaine, a transverse incision was made just above the antecubital level.  The brachial artery was dissected free beneath the fascia.  The cephalic vein was dissected free, was a good-sized vein about 5 mm in diameter. It was ligated distally.  The patient was heparinized.  The brachial artery was clamped proximally and distally and a longitudinal arteriotomy was made.  The vein was mobilized over and sewn end-to-side to the artery using continuous 6-0 Prolene suture.  At the completion, there was a good thrill in the fistula and a good radial and ulnar signal with the Doppler.  Hemostasis was obtained in the wound.  The heparin was partially reversed with protamine.  The wound was closed with a deep layer of 3-0 Vicryl.  The skin closed with a 4-0 Vicryl. Sterile dressing was applied.  The patient tolerated the procedure well and was transferred to the recovery room in stable condition.  All needle and sponge counts were correct.     Judeth Cornfield. Scot Dock, M.D.     CSD/MEDQ  D:  06/10/2011  T:  06/10/2011  Job:  [id number]  Electronically Signed by Deitra Mayo M.D. on 06/16/2011  09:18:43 AM

## 2011-06-17 LAB — CBC
MCH: 28.4 pg (ref 26.0–34.0)
MCHC: 30.9 g/dL (ref 30.0–36.0)
MCV: 92 fL (ref 78.0–100.0)
Platelets: 203 10*3/uL (ref 150–400)

## 2011-06-17 LAB — GLUCOSE, CAPILLARY: Glucose-Capillary: 133 mg/dL — ABNORMAL HIGH (ref 70–99)

## 2011-06-17 LAB — LIPID PANEL
Cholesterol: 155 mg/dL (ref 0–200)
HDL: 21 mg/dL — ABNORMAL LOW (ref >39)
Total CHOL/HDL Ratio: 7.4 RATIO
VLDL: 76 mg/dL — ABNORMAL HIGH (ref 0–40)

## 2011-06-17 LAB — CARDIAC PANEL(CRET KIN+CKTOT+MB+TROPI)
CK, MB: 1.9 ng/mL (ref 0.3–4.0)
Total CK: 83 U/L (ref 7–177)

## 2011-06-17 LAB — RENAL FUNCTION PANEL
BUN: 97 mg/dL — ABNORMAL HIGH (ref 6–23)
CO2: 14 mEq/L — ABNORMAL LOW (ref 19–32)
Calcium: 8.7 mg/dL (ref 8.4–10.5)
GFR calc Af Amer: 6 mL/min — ABNORMAL LOW (ref >60)
Glucose, Bld: 112 mg/dL — ABNORMAL HIGH (ref 70–99)
Phosphorus: 8.8 mg/dL — ABNORMAL HIGH (ref 2.3–4.6)

## 2011-06-17 LAB — HEPATITIS B CORE ANTIBODY, TOTAL: Hep B Core Total Ab: NEGATIVE

## 2011-06-17 LAB — HEPATITIS B SURFACE ANTIGEN: Hepatitis B Surface Ag: NEGATIVE

## 2011-06-17 LAB — HEPATITIS B SURFACE ANTIBODY,QUALITATIVE: Hep B S Ab: NEGATIVE

## 2011-06-18 ENCOUNTER — Other Ambulatory Visit (HOSPITAL_COMMUNITY): Payer: Medicare Other

## 2011-06-18 LAB — GLUCOSE, CAPILLARY
Glucose-Capillary: 147 mg/dL — ABNORMAL HIGH (ref 70–99)
Glucose-Capillary: 130 mg/dL — ABNORMAL HIGH (ref 70–99)
Glucose-Capillary: 133 mg/dL — ABNORMAL HIGH (ref 70–99)

## 2011-06-18 LAB — RENAL FUNCTION PANEL
CO2: 17 mEq/L — ABNORMAL LOW (ref 19–32)
Calcium: 8.9 mg/dL (ref 8.4–10.5)
Creatinine, Ser: 8.76 mg/dL — ABNORMAL HIGH (ref 0.50–1.10)
GFR calc Af Amer: 6 mL/min — ABNORMAL LOW (ref >60)
GFR calc non Af Amer: 5 mL/min — ABNORMAL LOW (ref >60)
Phosphorus: 8.6 mg/dL — ABNORMAL HIGH (ref 2.3–4.6)
Sodium: 138 mEq/L (ref 135–145)

## 2011-06-19 LAB — CBC
MCV: 91 fL (ref 78.0–100.0)
Platelets: 210 10*3/uL (ref 150–400)
RBC: 2.77 MIL/uL — ABNORMAL LOW (ref 3.87–5.11)
RDW: 16.4 % — ABNORMAL HIGH (ref 11.5–15.5)
WBC: 9.6 10*3/uL (ref 4.0–10.5)

## 2011-06-19 LAB — CROSSMATCH: Unit division: 0

## 2011-06-22 LAB — CULTURE, BLOOD (ROUTINE X 2): Culture: NO GROWTH

## 2011-06-23 ENCOUNTER — Encounter: Payer: Self-pay | Admitting: Vascular Surgery

## 2011-06-24 ENCOUNTER — Encounter (HOSPITAL_COMMUNITY): Payer: Medicare Other

## 2011-06-25 ENCOUNTER — Other Ambulatory Visit: Payer: Self-pay | Admitting: Nephrology

## 2011-06-25 ENCOUNTER — Encounter (HOSPITAL_COMMUNITY): Payer: Medicare Other | Attending: Nephrology

## 2011-06-25 DIAGNOSIS — D649 Anemia, unspecified: Secondary | ICD-10-CM | POA: Insufficient documentation

## 2011-06-25 DIAGNOSIS — N186 End stage renal disease: Secondary | ICD-10-CM | POA: Insufficient documentation

## 2011-06-25 DIAGNOSIS — I120 Hypertensive chronic kidney disease with stage 5 chronic kidney disease or end stage renal disease: Secondary | ICD-10-CM | POA: Insufficient documentation

## 2011-06-25 LAB — RENAL FUNCTION PANEL
Albumin: 3.3 g/dL — ABNORMAL LOW (ref 3.5–5.2)
Chloride: 104 mEq/L (ref 96–112)
Creatinine, Ser: 4.77 mg/dL — ABNORMAL HIGH (ref 0.50–1.10)
GFR calc non Af Amer: 10 mL/min — ABNORMAL LOW (ref >60)
Sodium: 139 mEq/L (ref 135–145)

## 2011-06-25 LAB — POCT HEMOGLOBIN-HEMACUE: Hemoglobin: 8.7 g/dL — ABNORMAL LOW (ref 12.0–15.0)

## 2011-06-25 LAB — IRON AND TIBC: Iron: 38 ug/dL — ABNORMAL LOW (ref 42–135)

## 2011-06-28 LAB — PTH, INTACT AND CALCIUM
Calcium, Total (PTH): 9.5 mg/dL (ref 8.4–10.5)
PTH: 103.7 pg/mL — ABNORMAL HIGH (ref 14.0–72.0)

## 2011-06-29 NOTE — Consult Note (Signed)
[name], [name]        ACCOUNT NO.:  [id number]  MEDICAL RECORD NO.:  [id number]  LOCATION:  6703                         FACILITY:  [facility]  PHYSICIAN:  Louis Meckel, M.D.DATE OF BIRTH:  06-Aug-1971  DATE OF CONSULTATION: DATE OF DISCHARGE:                                CONSULTATION   REQUESTING PHYSICIAN:  Reyne Dumas, MD  REASON FOR CONSULTATION:  Advanced CKD.  HISTORY OF PRESENT ILLNESS:  [name] is a 40 year old white female with past medical history significant for atrophic kidney, biopsy proven FSG, gout, hypertension, obesity, and obstructive sleep apnea as well as stage IV CKD followed by Dr. Hassell Done at [facility].  He saw her last in May 2012.  Creatinine was in the high 5 at that time.  She was to get access earlier in December but just had access done on June 10, 2011.  She had a previous AVF with failure to mature.  She now presents in the hospital with headache/nausea/vomiting/blurred vision.  Labs showed hemoglobin of 6.8, it was noted be 12.4 on May 13, 2011.  BUN and creatinine 99 and 8.95, potassium 4.7.  The patient has also been having some dysfunctional uterine bleeding and is followed by GYN.  We are asked to assist with CKD.  The patient still feels poorly with nausea, but able to take liquids this a.m.  PAST MEDICAL HISTORY: 1. FSG/solitary kidney. 2. Hypertension. 3. Obesity. 4. Gout. 5. Obstructive sleep apnea. 6. Anxiety/depression.  CURRENT MEDICATIONS: 1. Allopurinol 300 mg daily. 2. Xanax 0.5 mg t.i.d. scheduled. 3. Abilify 30 mg daily. 4. Atenolol 25 mg daily. 5. Calcitriol 0.25 daily. 6. Celexa 40 mg daily. 7. Iron p.o. 8. Advair. 9. Lasix 80 mg b.i.d. 10.Prinivil 20 mg daily. 11.Protonix 80 mg daily. 12.Renagel 800 t.i.d.  ALLERGIES:  AMOXICILLIN, MORPHINE, DEMEROL, and CLARITHROMYCIN.  SOCIAL HISTORY:  The patient is divorced, but remarried.  She has one child.  Smokes a pack per day.   Denies alcohol or any other drug use.  FAMILY HISTORY:  Positive for hypertension, diabetes, and coronary disease.  REVIEW OF SYSTEMS:  Positive for nausea, fatigue, headache, feeling woozy, and lower extremity edema.  Negative for nausea.  Negative for vomiting, shortness of breath, or dyspnea on exertion.  Otherwise review of systems is negative.  PHYSICAL EXAMINATION:  VITAL SIGNS:  Temperature 98.1, blood pressure 88/54, heart rate 80, respirations 20, oxygen saturation is 97% on room air.  GENERAL:  The patient is obese, alert, asking about going home. HEENT:  Pupils are equal, round, and reactive to light.  Extraocular movements intact.  Mucous membranes are moist. NECK:  There is no jugular venous distention. LUNGS:  Decreased breath sounds at the bases, but mostly clear. CARDIOVASCULAR:  Regular rate and rhythm. ABDOMEN:  Soft, nontender, nondistended. EXTREMITIES:  Reveal 1+ edema bilaterally, now worse per the patient. She has left upper AV fistula with thrill and bruit that was placed on June 10, 2011. NEUROLOGIC:  She is alert.  Exam is nonfocal.  LABORATORY DATA:  White count 11.6, hemoglobin 6.8, platelets 260.  Iron saturation is 22 with ferritin greater than 1000, phosphorus 7.7, magnesium 2.7.  Urinalysis is fairly bland.  Calcium 9.6, albumin 3.3, potassium 4.7,  bicarb 15, BUN and creatinine 99 and 8.95 with a glucose of 128.  ASSESSMENT:  A 40 year old white female with advanced EKG at baseline now with acute on chronic renal failure in the setting of significant hypotension, acute anemia, and also on an ACE inhibitor. 1. Renal:  Acute on chronic renal failure, if I did not have the clinical     scenario of being hypotensive on ACE, I would be preparing the     patient for HD.  However, I am hoping that with discontinuation of     antihypertensives to increase renal perfusion make a little     improvement in kidney function so that we can wait for AV fistula      to mature before needing to initiate dialysis. 2. Anemia.  Chronic kidney disease plus dysfunctional uterine     bleeding.  The patient received 1 unit of packed RBCs.  We will     start Aranesp and follow blood count. 3. Bones.  We will continue with outpatient regimen of Renagel and     calcitriol. 4. We will start sodium bicarb tablets for metabolic acidosis, which     is related to chronic kidney disease.  Thank you very much for consultation.  We will follow with you.          ______________________________ Louis Meckel, M.D.     KAG/MEDQ  D:  06/16/2011  T:  06/16/2011  Job:  [id number]  Electronically Signed by Corliss Parish M.D. on 06/29/2011 07:44:59 PM

## 2011-06-30 NOTE — Discharge Summary (Signed)
[name], [name]        ACCOUNT NO.:  [id number]  MEDICAL RECORD NO.:  [id number]  LOCATION:  6703                         FACILITY:  [facility]  PHYSICIAN:  Eleonore Chiquito, MD         DATE OF BIRTH:  03/29/1971  DATE OF ADMISSION:  06/15/2011 DATE OF DISCHARGE:  06/19/2011                              DISCHARGE SUMMARY   ADMISSION DIAGNOSES: 1. Nausea and vomiting. 2. Headache. 3. Anemia. 4. Chronic kidney disease. 5. Hypertension.  DISCHARGE DIAGNOSES: 1. Intractable headache resolved secondary to migraine. 2. Chronic kidney disease stage V.  The patient is to start     hemodialysis next month. 3. Nausea, vomiting resolved secondary to the migraine headache. 4. Dysfunctional uterine bleeding. 5. Anemia.  Tests performed during the hospital stay include: 1. Chest x-ray on August 15 showed stable cardiomegaly without acute     cardiopulmonary process. 2. Transvaginal ultrasound showed diffuse heterogenous endometrial    thickening.  Appearance may be due to the retained endometrial     blood products.  So no hysterography may prove helpful. 3. Renal ultrasound on August 15 showed no renal calculus,     bilateral cortical thickening and thinning.  A cyst in the lower     pole of left kidney measure 1.9 cm.  Unremarkable urinary bladder. 4. CT of abdomen and pelvis showed nonspecific wall thickening without     inflammatory stranding in the sigmoid colon with associated     diverticulitis and this could represent spasm or diverticulitis,     bilateral renal atrophy. 5. 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  Cyst, this can be deferred further with MRI if     clinically warranted.  PERTINENT LABS:  The patient's hemoglobin on the day of admission was 6.8.  After blood transfusion, it is 8.3, hematocrit 25.2.  Sodium 138, potassium 4.9, BUN 102, creatinine  8.76.  Consults during hospital stay include Nephrology consultation.  BRIEF HISTORY AND PHYSICAL:  This is a 40 year old female with history of CKD stage IV, replacement of left greater cephalic AV fistula by Dr. Scot Dock on August 9.  The patient has been unable to eat or drink anything.  She had intractable headache due to nausea, vomiting, and blurry vision.  The patient was found to have hemoglobin of 6.8 and was extremely fatigued.  She also had intermittent chest pain and chest tightness with intermittent shortness of breath.  So she came to the hospital and she was admitted for further evaluation.  BRIEF HOSPITAL COURSE: 1. CKD stage V.  The patient was seen by Dr. Moshe Cipro who     recommended to stop the ACE inhibitor at this time because of the     acute on chronic kidney disease.  So the patient's Lasix was held     in the hospital.  Also the ACE inhibitor was held.  At this time,     the patient does have elevated BUN and creatinine and will need     hemodialysis.  The patient has AV fistula in the left arm  which is     taking its time to mature.  After that the patient will be started     on hemodialysis.  The patient has an appointment to see Dr. Hassell Done on     September 6.  At this time, the patient is stable for discharge.  I     discussed with Dr. Moshe Cipro.  The patient's Lasix will be     restarted at 80 mg p.o. q.12 h. and the patient will start     hemodialysis next month. 2. Dysfunctional uterine bleeding.  The patient has an appointment to     see Dr. Charlesetta Garibaldi as outpatient.  We called up and got the     appointment on August 22, so she will see Dr. Charlesetta Garibaldi on August 22     for further evaluation for the dysfunctional uterine bleeding. 3. Anemia.  The patient has multifactorial causes of anemia because of     the CKD as well as the DUB.  So she was given blood transfusion in     the hospital.  The patient's hemoglobin after 3 units of blood     transfusion in  the hospital is 8.3/25.2 and the patient is stable     and asymptomatic. 4. Headache.  The patient was having headache in the hospital.  So a     CT of the head was done which showed changes as above.  I discussed     with Dr. Doy Mince who says that the patient's changes are unrelated     to the headache and there is no further workup required.  Though     the MRI was ordered, the patient because of claustrophobia did not     get MRI.  The patient may benefit from MRI as outpatient at a later     date.  I think this patient's headache is resolved and she does     have a history of migraine headache and headache was supposed to be     due to migraine.  The patient also has symptoms of nausea and     vomiting.  The patient is stable for discharge.  On the day of discharge, the patient's vitals are blood pressure 111/68, respirations 18, pulse 83, temp 97.5, O2 sat 93% on room air.  Medications on discharge include: 1. Sodium bicarb 650 p.o. b.i.d. 2. Abilify 30 mg 1 tablet p.o. at bedtime. 3. Advair HFA 250/50, 2 puffs inhaled daily. 4. Allopurinol 300 mg 1 tablet p.o. daily. 5. Alprazolam 0.5 p.o. t.i.d. 6. Amlodipine 5 mg p.o. daily. 7. Atenolol 100 mg p.o. twice a day. 8. Calcitriol 0.25 mg 1 tablet p.o. daily. 9. Citalopram 40 mg 1/2 tablet p.o. twice a day. 10.Furosemide 80 mg p.o. twice a day. 11.Hydroxyzine 10 mg p.o. twice a day. 12.Iron 65 mg 1 tablet p.o. daily. 13.Oxycodone 5 mg 1-2 tablets q.4 h. as needed. 14.Potassium over-the-counter 90 mg tablet p.o. daily. 15.Promethazine 25 mg 1 tablet twice a day as needed. 16.Singulair 10 mg 1 tablet p.o. at bedtime. 17.Ventolin 2 puffs inhaled every 4 hours as needed. 18.Cyanocobalamin 500 mcg 1 tablet p.o. daily. 19.Stop taking lisinopril, doxazosin, and hydroxyzine at bedtime. 20.The patient is also on Nexium 40 mg p.o. daily.  This can be     continued.     Eleonore Chiquito, MD     GL/MEDQ  D:  06/19/2011  T:   06/19/2011  Job:  [id number]  cc:   Maudie Flakes. Hassell Done, M.D.  Naima A. Charlesetta Garibaldi, M.D.  Electronically Signed by Eleonore Chiquito  on 06/30/2011 07:45:12 AM

## 2011-07-08 ENCOUNTER — Encounter (HOSPITAL_COMMUNITY): Payer: Medicare Other | Attending: Nephrology

## 2011-07-08 ENCOUNTER — Other Ambulatory Visit: Payer: Self-pay | Admitting: Nephrology

## 2011-07-08 DIAGNOSIS — N186 End stage renal disease: Secondary | ICD-10-CM | POA: Insufficient documentation

## 2011-07-08 DIAGNOSIS — D649 Anemia, unspecified: Secondary | ICD-10-CM | POA: Insufficient documentation

## 2011-07-08 DIAGNOSIS — I120 Hypertensive chronic kidney disease with stage 5 chronic kidney disease or end stage renal disease: Secondary | ICD-10-CM | POA: Insufficient documentation

## 2011-07-15 ENCOUNTER — Encounter (HOSPITAL_COMMUNITY): Payer: Medicare Other

## 2011-07-16 ENCOUNTER — Encounter (HOSPITAL_COMMUNITY): Payer: Medicare Other

## 2011-07-16 NOTE — H&P (Signed)
[name], [name]        ACCOUNT NO.:  [id number]  MEDICAL RECORD NO.:  [id number]  LOCATION:  [facility]                         FACILITY:  [facility]  PHYSICIAN:  Reyne Dumas, MD       DATE OF BIRTH:  [birth year]  DATE OF ADMISSION:  06/15/2011 DATE OF DISCHARGE:                             HISTORY & PHYSICAL   PRIMARY CARE PHYSICIAN:  In [place].  PRIMARY NEPHROLOGIST:  Maudie Flakes. Hassell Done, MD  CHIEF COMPLAINT:  Intractable headache.  SUBJECTIVE:  A 40 year old female with a history of chronic kidney disease stage IV, had a placement of a new left greater cephalic AV fistula by Dr. Scot Dock on June 10, 2011.  Since then, the patient has not been able to eat or drink anything.  She has had intractable headache associated with nausea, vomiting and blurry vision.  The patient was also noted to have a significantly low hemoglobin of 6.8 and has been extremely fatigued.  She has also had some intermittent chest pain and chest tightness, but minimal shortness of breath.  After the placement of AV fistula in her left upper extremity, she has had some left axillary pain radiating into her left arm.  She has also had ongoing vaginal bleeding for the last 3 months for which she has seen her gynecologist in [facility] in [place].  She was last seen here on May 29, 2011.  The patient states that she has been scheduled and rescheduled several times for a uterine appellation.  She does have a history of uterine fibroids.  However, because of her multiple medical problems, she has never been deemed to be ready to have any kind of surgery.  She used to be on Depo Provera, but she discontinued these last year because of weight gain and the patient needs to lose about 50 more pounds to undergo renal transplant.  The patient denies any blood in the stool, black tarry stool, in fact she also states that she has not passed any flatus for the last 3 days.  She has had minimal p.o.  intake.  She has had bilious vomiting over the course of the last 3 days and a total of 3 episodes today.  The patient also states that she has chronic lower extremity and bilateral lower extremity edema.  The patient denies any fever, chills, rigors or cough. She was recently treated for an MRSA infection prior to her fistula placement.  REVIEW OF SYSTEMS:  Fourteen point review of systems was done as documented in the HPI.  PAST MEDICAL HISTORY: 1. History of stage IV to V chronic kidney disease. 2. Solitary right kidney with atrophic left kidney. 3. Hypertension. 4. History of migraine headaches. 5. Allergic asthma. 6. GERD. 7. History of diverticulitis. 8. Gastritis. 9. Acid reflux disease. 10.Morbid obesity. 11.Anxiety, depression. 12.History of prior C-section.  CURRENT MEDICATIONS:  Sevelamer, oxycodone, alprazolam, Ventolin, Advair, promethazine, Nexium, calcitriol, allopurinol, atenolol, doxazosin, furosemide, lisinopril, Norvasc, Celexa, Abilify, vitamin B12, potassium and iron.  ALLERGIES:  To BIAXIN that causes nausea.  She does not tolerate AMOXICILLIN and MORPHINE.  FAMILY HISTORY:  Positive for hypertension, diabetes, coronary artery disease, stroke and colon and gallbladder cancer.  There is no family history of renal disease.  SOCIAL HISTORY:  She is divorced.  She is engaged and remarried.  She had a preteen child.  She currently smokes a pack of cigarettes on a daily basis.  PHYSICAL EXAMINATION:  VITAL SIGNS:  Blood pressure 93/49, pulse of 83, respirations 18 and temperature 98.4. GENERAL:  Currently comfortable in no acute cardiopulmonary distress. HEENT:  Pupils are equal and reactive.  Extraocular movements intact. NECK:  Supple.  No JVD. LUNGS:  Clear to auscultation bilaterally.  No wheezes, no crackles and no rhonchi. CARDIOVASCULAR:  Regular rate and rhythm.  No murmurs, rubs or gallops. ABDOMEN:  Obese, soft, nontender and  nondistended. EXTREMITIES:  Without cyanosis, clubbing or edema. NEUROLOGIC:  Cranial nerves II through XII grossly intact. PSYCHIATRIC:  Appropriate mood and affect.  LABORATORY DATA:  Urinalysis negative.  Lipase is 50.  Comprehensive metabolic panel; sodium 123456, potassium 4.7, chloride 104, bicarb 15, glucose 128, BUN 99, creatinine 8.95, total bili 0.1, AST 7, ALT 9, albumin 3.3 and calcium 9.6.  Urine pregnancy test is negative.  CBC; WBC 11.6, hemoglobin 6.8, hematocrit 20.5 and platelet count of 260.  Chest x-ray shows no acute cardiopulmonary disease.  ASSESSMENT AND PLAN: 1. Intractable headache associated with blurry vision, rule out     cerebrovascular accident could be atypical migraine, but this is     different from the patient's typical migraine headache. 2. Stage IV to V chronic kidney disease with recent placement of     arteriovenous fistula in the left brachiocephalic vein. 3. Morbid obesity. 4. Left upper extremity pain, rule out deep venous thrombosis. 5. Ongoing vaginal bleeding for the last 3 months which is chronic now     associated with blood loss anemia. 6. Anemia likely secondary to blood loss in addition to chronic kidney     disease.  PLAN: 1. The patient will be admitted for her intractable nausea, vomiting     as well as because of her symptomatic anemia and intractable     headache.  The patient initially would need a full neurologic     workup to rule out a CVA given her associated blurry vision, but no     associated unilateral weakness.  We will start with a CT of the     brain and then consider doing an MRI of the brain if the CT is     negative. 2. The patient will be transfused with 1 unit of packed red blood     cells.  The patient will be continued on iron supplementation.  We     will check an anemia panel and a TSH.  We will continue her on her     home dose of Lasix in the setting of a chronic kidney disease and     transfusion.  The  patient would also need a pelvic ultrasound to     evaluate her vaginal bleeding and she would need a Gynecology     evaluation in the morning because of her ongoing vaginal bleeding. 3. Left upper extremity pain.  The patient was recently treated for     MRSA infection.  I do not see any obvious erythema induration at     the site of the surgery and the site looks like she is healing     well.  However, given her left axillary pain and left arm pain, we     will obtain the Doppler ultrasound to rule out a DVT. 4. Intractable nausea, vomiting.  This could be secondary  to viral     gastroenteritis versus secondary to her intractable headache and     migraine headache.  We will obtain a CT scan of the abdomen and     pelvis with p.o. contrast to rule out obstruction. 5. The patient also has a history of hypertension and we will minimize     her antihypertensive medications since her blood pressure is     borderline low today, but she will be continued on her atenolol,     Lasix and her lisinopril tonight. 6. Chronic kidney disease.  The patient does not have any significant     electrolyte abnormalities and does not need any urgent dialysis     tonight.  Nephrology will be notified about her admission in the     morning.  She is a full code.     Reyne Dumas, MD     NA/MEDQ  D:  06/15/2011  T:  06/15/2011  Job:  [id number]  Electronically Signed by Reyne Dumas MD on 07/16/2011 07:40:53 AM

## 2011-07-22 ENCOUNTER — Other Ambulatory Visit: Payer: Self-pay | Admitting: Nephrology

## 2011-07-22 ENCOUNTER — Encounter (HOSPITAL_COMMUNITY): Payer: Medicare Other

## 2011-07-22 LAB — RENAL FUNCTION PANEL
CO2: 24 mEq/L (ref 19–32)
Chloride: 103 mEq/L (ref 96–112)
GFR calc Af Amer: 14 mL/min — ABNORMAL LOW (ref >60)
Glucose, Bld: 178 mg/dL — ABNORMAL HIGH (ref 70–99)
Potassium: 3.8 mEq/L (ref 3.5–5.1)
Sodium: 139 mEq/L (ref 135–145)

## 2011-07-22 LAB — URIC ACID: Uric Acid, Serum: 6.6 mg/dL (ref 2.4–7.0)

## 2011-07-22 LAB — POCT HEMOGLOBIN-HEMACUE: Hemoglobin: 9.6 g/dL — ABNORMAL LOW (ref 12.0–15.0)

## 2011-07-23 LAB — IRON AND TIBC
Iron: 72 ug/dL (ref 42–135)
Saturation Ratios: 32 % (ref 20–55)
TIBC: 222 ug/dL — ABNORMAL LOW (ref 250–470)

## 2011-07-27 ENCOUNTER — Encounter: Payer: Self-pay | Admitting: Vascular Surgery

## 2011-07-28 ENCOUNTER — Encounter: Payer: Self-pay | Admitting: Vascular Surgery

## 2011-07-28 ENCOUNTER — Ambulatory Visit (INDEPENDENT_AMBULATORY_CARE_PROVIDER_SITE_OTHER): Payer: Medicare Other | Admitting: Vascular Surgery

## 2011-07-28 VITALS — BP 120/69 | HR 86 | Resp 24 | Ht 64.0 in | Wt 277.0 lb

## 2011-07-28 DIAGNOSIS — N186 End stage renal disease: Secondary | ICD-10-CM

## 2011-07-28 NOTE — Progress Notes (Signed)
[id number] of left brachiocephalic AV fistula  HPI: [name] is a 40 y.o. female with chronic kidney disease who had a left brachiocephalic AV fistula placed on 06/10/2011. He comes in for a routine followup visit. She has had no pain associated with her fistula. She's been trying to exercise her arm. She is not yet on dialysis. She's had no uremic symptoms. Specifically she denies nausea, vomiting, palpitations, anorexia, or fatigue.  Past Medical History  Diagnosis Date  . Hypertension   . Anemia   . Arthritis   . Depression   . Reflux   . Dizziness   . Anxiety   . ADD (attention deficit disorder)   . Asthma   . Headache   . Chronic kidney disease     Atrophic left kidney  . Gout   . Sleep apnea     FAMILY HISTORY: Family History  Problem Relation Age of Onset  . Hypertension Mother   . Heart disease Father     SOCIAL HISTORY: History  Substance Use Topics  . Smoking status: Current Everyday Smoker -- 0.5 packs/day    Types: Cigarettes  . Smokeless tobacco: Not on file  . Alcohol Use: No    Allergies  Allergen Reactions  . Biaxin   . Morphine And Related     Current Outpatient Prescriptions  Medication Sig Dispense Refill  . albuterol (PROVENTIL) (5 MG/ML) 0.5% nebulizer solution Take 2.5 mg by nebulization every 6 (six) hours as needed.        Marland Kitchen allopurinol (ZYLOPRIM) 300 MG tablet Take 300 mg by mouth daily.        Marland Kitchen ALPRAZolam (XANAX) 0.5 MG tablet Take 0.5 mg by mouth 3 (three) times daily as needed.        Marland Kitchen amLODipine (NORVASC) 5 MG tablet Take 5 mg by mouth daily.        . ARIPiprazole (ABILIFY) 15 MG tablet Take 30 mg by mouth daily.        Marland Kitchen atenolol (TENORMIN) 100 MG tablet Take 100 mg by mouth 2 (two) times daily.        . calcitRIOL (ROCALTROL) 0.25 MCG capsule Take 0.25 mcg by mouth daily.        . citalopram (CELEXA) 20 MG tablet Take 40 mg by mouth daily.       Marland Kitchen doxazosin (CARDURA) 8 MG tablet Take 8 mg by mouth 2 (two) times  daily.        Marland Kitchen esomeprazole (NEXIUM) 40 MG capsule Take 40 mg by mouth daily before breakfast.        . Fluticasone-Salmeterol (ADVAIR DISKUS IN) Inhale into the lungs 2 (two) times daily as needed.        . furosemide (LASIX) 40 MG tablet Take 80 mg by mouth 2 (two) times daily.        . norethindrone (AYGESTIN) 5 MG tablet Take 5 mg by mouth 2 (two) times daily.        Marland Kitchen oxyCODONE (OXYCONTIN) 10 MG 12 hr tablet Take 10 mg by mouth every 12 (twelve) hours.        . promethazine (PHENERGAN) 25 MG tablet Take 25 mg by mouth every 6 (six) hours as needed.        . sevelamer (RENVELA) 800 MG tablet Take 800 mg by mouth 3 (three) times daily with meals.        . sodium bicarbonate 650 MG tablet Take 650 mg by mouth 2 (two) times daily.        Marland Kitchen  Albuterol (PROVENTIL IN) Inhale into the lungs as needed.        . colchicine 0.6 MG tablet Take 0.6 mg by mouth daily.        . cyclobenzaprine (FLEXERIL) 10 MG tablet Take 10 mg by mouth 3 (three) times daily as needed.        Marland Kitchen lisinopril (PRINIVIL,ZESTRIL) 40 MG tablet Take 40 mg by mouth 2 (two) times daily.        . montelukast (SINGULAIR) 10 MG tablet Take 10 mg by mouth at bedtime.          REVIEW OF SYSTEMS: CARDIOVASCULAR:  [ ]  chest pain   [ ]  chest pressure   [ ]  palpitations   [ ]  orthopnea   [ ]  dyspnea on exertion   [ ]  claudication   [ ]  rest pain   [ ]  DVT   [ ]  phlebitis PULMONARY:   [ ]  productive cough   [ ]  asthma   [ ]  wheezing NEUROLOGIC:   [ ]  weakness  [ ]  paresthesias  [ ]  aphasia  [ ]  amaurosis  [ ]  dizziness HEMATOLOGIC:   [ ]  bleeding problems   [ ]  clotting disorders MUSCULOSKELETAL:  [ ]  joint pain   [ ]  joint swelling GASTROINTESTINAL: [ ]   blood in stool  [ ]   hematemesis GENITOURINARY:  [ ]   dysuria  [ ]   hematuria PSYCHIATRIC:  [ ]  history of major depression INTEGUMENTARY:  [ ]  rashes  [ ]  ulcers CONSTITUTIONAL:  [ ]  fever   [ ]  chills  PHYSICAL EXAM: Filed Vitals:   07/28/11 1151  BP: 120/69  Pulse: 86  Resp:  24  Height: 5\' 4"  (1.626 m)  Weight: 277 lb (125.646 kg)   Body mass index is 47.55 kg/(m^2).  GENERAL: The patient appears their stated age. The vital signs are documented above. CARDIOVASCULAR: There is a regular rate and rhythm without significant murmur appreciated.  PULMONARY: There is good air exchange bilaterally without wheezing or rales. EXT: her left upper arm AV fistula is somewhat pulsatile. She has an audible bruit in her fistula. MUSCULOSKELETAL: There are no major deformities or cyanosis.  NEUROLOGIC: No focal weakness or paresthesias are detected. SKIN: There are no ulcers or rashes noted. PSYCHIATRIC: The patient has a normal affect.  MEDICAL ISSUES: Given that the fistula is pulsatile I suspect that she has an outflow obstruction. I have scheduled her for a fistulogram. We will limit the dye and hopefully find a stenosis amenable to angioplasty to prove the performance of her fistula. This is scheduled for 08/02/2011.

## 2011-08-02 ENCOUNTER — Ambulatory Visit (HOSPITAL_COMMUNITY)
Admission: RE | Admit: 2011-08-02 | Discharge: 2011-08-02 | Disposition: A | Discharge status: Home / Self Care | Payer: Medicare Other | Source: Ambulatory Visit | Attending: Vascular Surgery | Admitting: Vascular Surgery

## 2011-08-02 DIAGNOSIS — Z0389 Encounter for observation for other suspected diseases and conditions ruled out: Secondary | ICD-10-CM | POA: Insufficient documentation

## 2011-08-02 DIAGNOSIS — N186 End stage renal disease: Secondary | ICD-10-CM | POA: Insufficient documentation

## 2011-08-02 DIAGNOSIS — I120 Hypertensive chronic kidney disease with stage 5 chronic kidney disease or end stage renal disease: Secondary | ICD-10-CM | POA: Insufficient documentation

## 2011-08-02 DIAGNOSIS — T82898A Other specified complication of vascular prosthetic devices, implants and grafts, initial encounter: Secondary | ICD-10-CM

## 2011-08-02 LAB — POCT I-STAT, CHEM 8
Calcium, Ion: 1.12 mmol/L (ref 1.12–1.32)
Chloride: 110 mEq/L (ref 96–112)
Creatinine, Ser: 4.5 mg/dL — ABNORMAL HIGH (ref 0.50–1.10)
Glucose, Bld: 153 mg/dL — ABNORMAL HIGH (ref 70–99)
HCT: 25 % — ABNORMAL LOW (ref 36.0–46.0)

## 2011-08-03 NOTE — Op Note (Signed)
  NAMETOTSIE, KRAYNAK        ACCOUNT NO.:  [id number]  MEDICAL RECORD NO.:  [id number]  LOCATION:  [facility]                         FACILITY:  [facility]  PHYSICIAN:  Judeth Cornfield. Scot Dock, M.D.DATE OF BIRTH:  03-25-1971  DATE OF PROCEDURE:  08/02/2011 DATE OF DISCHARGE:  08/02/2011                              OPERATIVE REPORT   PREOPERATIVE DIAGNOSIS:  Status post arteriovenous fistula with possible outflow stenosis.  POSTOPERATIVE DIAGNOSIS:  Status post arteriovenous fistula with possible outflow stenosis with no outflow stenosis identified.  SURGEON:  Judeth Cornfield. Scot Dock, MD  ANESTHESIA:  Local with sedation.  TECHNIQUE:  The patient was taken to the PV lab and sedated with a milligram of Versed.  The left upper extremity was prepped and draped in usual sterile fashion.  After the skin was infiltrated with 1% lidocaine and under ultrasound guidance, the left AV fistula was cannulated and a micropuncture wire introduced.  The sheath was introduced over the wire. This was then secured with plastic drape and fistulogram obtained. Next, the fistula was compressed to reflux blood into the arterial anastomosis.  FINDINGS:  The AV fistula is widely patent with no areas of significant stenosis identified in the cephalic vein, subclavian vein, or brachiocephalic vein.  There is one small competing branch in the mid upper arm.  The arterial anastomosis is widely patent.  CONCLUSION:  No outflow stenosis identified and no problems with the fistula itself identified.     Judeth Cornfield. Scot Dock, M.D.     CSD/MEDQ  D:  08/02/2011  T:  08/02/2011  Job:  [id number]  Electronically Signed by Deitra Mayo M.D. on 08/03/2011 01:34:27 PM

## 2011-08-06 ENCOUNTER — Encounter (HOSPITAL_COMMUNITY): Payer: Medicare Other

## 2011-08-09 ENCOUNTER — Encounter (HOSPITAL_COMMUNITY): Payer: Medicare Other

## 2011-08-12 ENCOUNTER — Other Ambulatory Visit: Payer: Self-pay | Admitting: Nephrology

## 2011-08-12 ENCOUNTER — Encounter (HOSPITAL_COMMUNITY)
Admission: RE | Admit: 2011-08-12 | Discharge: 2011-08-12 | Disposition: A | Discharge status: Home / Self Care | Payer: Medicare Other | Source: Ambulatory Visit | Attending: Nephrology | Admitting: Nephrology

## 2011-08-12 DIAGNOSIS — I120 Hypertensive chronic kidney disease with stage 5 chronic kidney disease or end stage renal disease: Secondary | ICD-10-CM | POA: Insufficient documentation

## 2011-08-12 DIAGNOSIS — N186 End stage renal disease: Secondary | ICD-10-CM | POA: Insufficient documentation

## 2011-08-12 DIAGNOSIS — D649 Anemia, unspecified: Secondary | ICD-10-CM | POA: Insufficient documentation

## 2011-08-12 LAB — IRON AND TIBC
Iron: 46 ug/dL (ref 42–135)
Saturation Ratios: 18 % — ABNORMAL LOW (ref 20–55)
TIBC: 255 ug/dL (ref 250–470)
UIBC: 209 ug/dL (ref 125–400)

## 2011-08-12 LAB — RENAL FUNCTION PANEL
BUN: 55 mg/dL — ABNORMAL HIGH (ref 6–23)
CO2: 19 mEq/L (ref 19–32)
Calcium: 9.8 mg/dL (ref 8.4–10.5)
Chloride: 106 mEq/L (ref 96–112)
Creatinine, Ser: 4.92 mg/dL — ABNORMAL HIGH (ref 0.50–1.10)
GFR calc non Af Amer: 10 mL/min — ABNORMAL LOW (ref >90)
Glucose, Bld: 170 mg/dL — ABNORMAL HIGH (ref 70–99)

## 2011-08-12 LAB — FERRITIN: Ferritin: 1063 ng/mL — ABNORMAL HIGH (ref 10–291)

## 2011-08-26 ENCOUNTER — Other Ambulatory Visit: Payer: Self-pay | Admitting: Nephrology

## 2011-08-26 ENCOUNTER — Encounter (HOSPITAL_COMMUNITY): Payer: Medicare Other

## 2011-08-26 LAB — POCT HEMOGLOBIN-HEMACUE: Hemoglobin: 9 g/dL — ABNORMAL LOW (ref 12.0–15.0)

## 2011-09-02 ENCOUNTER — Other Ambulatory Visit (HOSPITAL_COMMUNITY): Payer: Self-pay | Admitting: *Deleted

## 2011-09-09 ENCOUNTER — Encounter (HOSPITAL_COMMUNITY): Payer: Medicare Other

## 2011-09-20 ENCOUNTER — Other Ambulatory Visit: Payer: Self-pay | Admitting: Obstetrics and Gynecology

## 2011-09-27 ENCOUNTER — Encounter (HOSPITAL_COMMUNITY)
Admission: RE | Admit: 2011-09-27 | Discharge: 2011-09-27 | Disposition: A | Discharge status: Home / Self Care | Payer: Medicare Other | Source: Ambulatory Visit | Attending: Nephrology | Admitting: Nephrology

## 2011-09-27 DIAGNOSIS — I120 Hypertensive chronic kidney disease with stage 5 chronic kidney disease or end stage renal disease: Secondary | ICD-10-CM | POA: Insufficient documentation

## 2011-09-27 DIAGNOSIS — D649 Anemia, unspecified: Secondary | ICD-10-CM | POA: Insufficient documentation

## 2011-09-27 DIAGNOSIS — N186 End stage renal disease: Secondary | ICD-10-CM | POA: Insufficient documentation

## 2011-09-27 LAB — POCT HEMOGLOBIN-HEMACUE: Hemoglobin: 8.2 g/dL — ABNORMAL LOW (ref 12.0–15.0)

## 2011-09-27 MED ORDER — EPOETIN ALFA 40000 UNIT/ML IJ SOLN
INTRAMUSCULAR | Status: AC
  Administered 2011-09-27: 40000 [IU] via SUBCUTANEOUS
  Filled 2011-09-27: qty 1

## 2011-09-27 MED ORDER — EPOETIN ALFA 10000 UNIT/ML IJ SOLN: 40000.0000 [IU] | INTRAMUSCULAR | Status: DC

## 2011-10-07 ENCOUNTER — Other Ambulatory Visit (HOSPITAL_COMMUNITY): Payer: Self-pay | Admitting: *Deleted

## 2011-10-08 ENCOUNTER — Other Ambulatory Visit (HOSPITAL_COMMUNITY): Payer: Medicare Other

## 2011-10-11 ENCOUNTER — Encounter (HOSPITAL_COMMUNITY)
Admission: RE | Admit: 2011-10-11 | Discharge: 2011-10-11 | Disposition: A | Discharge status: Home / Self Care | Payer: Medicare Other | Source: Ambulatory Visit | Attending: Nephrology | Admitting: Nephrology

## 2011-10-11 ENCOUNTER — Encounter (HOSPITAL_COMMUNITY): Payer: Medicare Other

## 2011-10-11 DIAGNOSIS — D649 Anemia, unspecified: Secondary | ICD-10-CM | POA: Insufficient documentation

## 2011-10-11 DIAGNOSIS — I120 Hypertensive chronic kidney disease with stage 5 chronic kidney disease or end stage renal disease: Secondary | ICD-10-CM | POA: Insufficient documentation

## 2011-10-11 DIAGNOSIS — N186 End stage renal disease: Secondary | ICD-10-CM | POA: Insufficient documentation

## 2011-10-11 LAB — POCT HEMOGLOBIN-HEMACUE: Hemoglobin: 8.5 g/dL — ABNORMAL LOW (ref 12.0–15.0)

## 2011-10-11 MED ORDER — FERUMOXYTOL INJECTION 510 MG/17 ML
INTRAVENOUS | Status: AC
  Administered 2011-10-11: 510 mg via INTRAVENOUS
  Filled 2011-10-11: qty 17

## 2011-10-11 MED ORDER — EPOETIN ALFA 40000 UNIT/ML IJ SOLN
INTRAMUSCULAR | Status: AC
  Administered 2011-10-11: 40000 [IU] via SUBCUTANEOUS
  Filled 2011-10-11: qty 1

## 2011-10-11 MED ORDER — FERUMOXYTOL INJECTION 510 MG/17 ML
510.0000 mg | Freq: Once | INTRAVENOUS | Status: AC
  Administered 2011-10-11: 510 mg via INTRAVENOUS

## 2011-10-11 MED ORDER — EPOETIN ALFA 10000 UNIT/ML IJ SOLN: 40000.0000 [IU] | INTRAMUSCULAR | Status: DC

## 2011-10-18 ENCOUNTER — Encounter (HOSPITAL_COMMUNITY): Payer: Self-pay | Admitting: Emergency Medicine

## 2011-10-18 ENCOUNTER — Other Ambulatory Visit: Payer: Self-pay

## 2011-10-18 ENCOUNTER — Encounter (HOSPITAL_BASED_OUTPATIENT_CLINIC_OR_DEPARTMENT_OTHER): Payer: Self-pay | Admitting: *Deleted

## 2011-10-18 ENCOUNTER — Emergency Department (HOSPITAL_COMMUNITY): Payer: Medicare Other

## 2011-10-18 ENCOUNTER — Inpatient Hospital Stay (HOSPITAL_BASED_OUTPATIENT_CLINIC_OR_DEPARTMENT_OTHER)
Admission: EM | Admit: 2011-10-18 | Discharge: 2011-10-21 | DRG: 683 | Disposition: A | Discharge status: Home / Self Care | Payer: Medicare Other | Attending: Internal Medicine | Admitting: Internal Medicine

## 2011-10-18 ENCOUNTER — Emergency Department (HOSPITAL_COMMUNITY)
Admission: EM | Admit: 2011-10-18 | Discharge: 2011-10-18 | Disposition: A | Discharge status: Home / Self Care | Payer: Medicare Other | Source: Home / Self Care

## 2011-10-18 DIAGNOSIS — N189 Chronic kidney disease, unspecified: Secondary | ICD-10-CM | POA: Diagnosis present

## 2011-10-18 DIAGNOSIS — N179 Acute kidney failure, unspecified: Principal | ICD-10-CM | POA: Diagnosis present

## 2011-10-18 DIAGNOSIS — I120 Hypertensive chronic kidney disease with stage 5 chronic kidney disease or end stage renal disease: Secondary | ICD-10-CM | POA: Diagnosis present

## 2011-10-18 DIAGNOSIS — N185 Chronic kidney disease, stage 5: Secondary | ICD-10-CM | POA: Diagnosis present

## 2011-10-18 DIAGNOSIS — J45909 Unspecified asthma, uncomplicated: Secondary | ICD-10-CM | POA: Diagnosis present

## 2011-10-18 DIAGNOSIS — M129 Arthropathy, unspecified: Secondary | ICD-10-CM | POA: Diagnosis present

## 2011-10-18 DIAGNOSIS — M549 Dorsalgia, unspecified: Secondary | ICD-10-CM | POA: Diagnosis present

## 2011-10-18 DIAGNOSIS — E876 Hypokalemia: Secondary | ICD-10-CM | POA: Diagnosis present

## 2011-10-18 DIAGNOSIS — F329 Major depressive disorder, single episode, unspecified: Secondary | ICD-10-CM | POA: Diagnosis present

## 2011-10-18 DIAGNOSIS — J4489 Other specified chronic obstructive pulmonary disease: Secondary | ICD-10-CM | POA: Diagnosis present

## 2011-10-18 DIAGNOSIS — F988 Other specified behavioral and emotional disorders with onset usually occurring in childhood and adolescence: Secondary | ICD-10-CM | POA: Diagnosis present

## 2011-10-18 DIAGNOSIS — N19 Unspecified kidney failure: Secondary | ICD-10-CM

## 2011-10-18 DIAGNOSIS — E669 Obesity, unspecified: Secondary | ICD-10-CM | POA: Diagnosis present

## 2011-10-18 DIAGNOSIS — I10 Essential (primary) hypertension: Secondary | ICD-10-CM | POA: Diagnosis present

## 2011-10-18 DIAGNOSIS — Z6841 Body Mass Index (BMI) 40.0 and over, adult: Secondary | ICD-10-CM

## 2011-10-18 DIAGNOSIS — F3289 Other specified depressive episodes: Secondary | ICD-10-CM | POA: Diagnosis present

## 2011-10-18 DIAGNOSIS — K219 Gastro-esophageal reflux disease without esophagitis: Secondary | ICD-10-CM | POA: Diagnosis present

## 2011-10-18 DIAGNOSIS — J449 Chronic obstructive pulmonary disease, unspecified: Secondary | ICD-10-CM | POA: Diagnosis present

## 2011-10-18 DIAGNOSIS — F17200 Nicotine dependence, unspecified, uncomplicated: Secondary | ICD-10-CM | POA: Diagnosis present

## 2011-10-18 DIAGNOSIS — D649 Anemia, unspecified: Secondary | ICD-10-CM | POA: Diagnosis present

## 2011-10-18 DIAGNOSIS — M109 Gout, unspecified: Secondary | ICD-10-CM | POA: Diagnosis present

## 2011-10-18 DIAGNOSIS — Q602 Renal agenesis, unspecified: Secondary | ICD-10-CM

## 2011-10-18 LAB — CBC
HCT: 25 % — ABNORMAL LOW (ref 36.0–46.0)
HCT: 26.2 % — ABNORMAL LOW (ref 36.0–46.0)
Hemoglobin: 7.8 g/dL — ABNORMAL LOW (ref 12.0–15.0)
MCH: 28.4 pg (ref 26.0–34.0)
MCH: 28.2 pg (ref 26.0–34.0)
MCV: 90.9 fL (ref 78.0–100.0)
MCV: 91.3 fL (ref 78.0–100.0)
Platelets: 373 10*3/uL (ref 150–400)
RBC: 2.75 MIL/uL — ABNORMAL LOW (ref 3.87–5.11)
RBC: 2.87 MIL/uL — ABNORMAL LOW (ref 3.87–5.11)
RDW: 16.4 % — ABNORMAL HIGH (ref 11.5–15.5)

## 2011-10-18 LAB — BASIC METABOLIC PANEL
CO2: 21 mEq/L (ref 19–32)
Calcium: 9.9 mg/dL (ref 8.4–10.5)
Chloride: 100 mEq/L (ref 96–112)
Creatinine, Ser: 6.3 mg/dL — ABNORMAL HIGH (ref 0.50–1.10)
Glucose, Bld: 175 mg/dL — ABNORMAL HIGH (ref 70–99)

## 2011-10-18 LAB — URINALYSIS, ROUTINE W REFLEX MICROSCOPIC
Bilirubin Urine: NEGATIVE
Ketones, ur: NEGATIVE mg/dL
Nitrite: NEGATIVE
Specific Gravity, Urine: 1.01 (ref 1.005–1.030)
pH: 5.5 (ref 5.0–8.0)

## 2011-10-18 LAB — COMPREHENSIVE METABOLIC PANEL
AST: 13 U/L (ref 0–37)
Albumin: 3.6 g/dL (ref 3.5–5.2)
Calcium: 9.9 mg/dL (ref 8.4–10.5)
Chloride: 101 mEq/L (ref 96–112)
Creatinine, Ser: 6.22 mg/dL — ABNORMAL HIGH (ref 0.50–1.10)
Total Protein: 7.4 g/dL (ref 6.0–8.3)

## 2011-10-18 LAB — DIFFERENTIAL
Eosinophils Absolute: 0 10*3/uL (ref 0.0–0.7)
Eosinophils Relative: 0 % (ref 0–5)
Lymphocytes Relative: 17 % (ref 12–46)
Lymphs Abs: 2 10*3/uL (ref 0.7–4.0)
Monocytes Relative: 4 % (ref 3–12)

## 2011-10-18 LAB — URINE MICROSCOPIC-ADD ON

## 2011-10-18 MED ORDER — LORAZEPAM 2 MG/ML IJ SOLN
0.5000 mg | Freq: Once | INTRAMUSCULAR | Status: AC
  Administered 2011-10-18: 0.5 mg via INTRAVENOUS
  Filled 2011-10-18: qty 1

## 2011-10-18 MED ORDER — FUROSEMIDE 10 MG/ML IJ SOLN
160.0000 mg | Freq: Once | INTRAVENOUS | Status: AC
  Administered 2011-10-18: 160 mg via INTRAVENOUS
  Filled 2011-10-18: qty 16

## 2011-10-18 NOTE — ED Provider Notes (Signed)
History  This chart was scribed for Carmin Muskrat, MD by Jenne Campus. This patient was seen in room MH01/MH01 and the patient's care was started at 8:27PM.  CSN: [id number] Arrival date & time: 10/18/2011  8:11 PM   First MD Initiated Contact with Patient 10/18/11 2020      Chief Complaint  Patient presents with  . Shortness of Breath  . Leg Swelling  . Chest Pain      The history is provided by the patient. No language interpreter was used.   [name] is a 40 y.o. female who presents to the Emergency Department complaining of 2 days of gradual onset, non-changing, constant bilateral leg swelling with associated SOB, chill and chest tightness. Pt has a h/o kidney failure and is on lasix regularly. She also reports that she has a shunt placed but is not using it yet. Pt states that she has experienced similar but milder leg swellings before but that usually the leg swelling resolves after she takes lasix. Pt reports that the leg swelling has not resolved since afetr taking the lasix and that elevation has not improved symptoms. She also states that is usually the right leg that is more swollen than the left. She denies nausea, vomiting, diarrhea, abdominal pain and chest pain as associated symptoms. Pt also c/o mild nasal congestion. Pt is a smoker.  Past Medical History  Diagnosis Date  . Hypertension   . Anemia   . Arthritis   . Depression   . Reflux   . Dizziness   . Anxiety   . ADD (attention deficit disorder)   . Asthma   . Headache   . Chronic kidney disease     Atrophic left kidney  . Gout   . Sleep apnea     Past Surgical History  Procedure Date  . Cesarean section   . Av fistula placement 06/10/11    Left brachiocephalic AVF  . Spine surgery 2004    Lumbar diskectomy    Family History  Problem Relation Age of Onset  . Hypertension Mother   . Heart disease Father     History  Substance Use Topics  . Smoking status: Current Everyday  Smoker -- 0.5 packs/day    Types: Cigarettes  . Smokeless tobacco: Not on file  . Alcohol Use: No    OB History    Grav Para Term Preterm Abortions TAB SAB Ect Mult Living                  Review of Systems  Constitutional: Positive for chills. Negative for fever.  HENT: Positive for congestion. Negative for sore throat and rhinorrhea.   Respiratory: Positive for chest tightness and shortness of breath. Negative for cough.   Cardiovascular: Positive for leg swelling. Negative for chest pain.  Gastrointestinal: Negative for nausea, vomiting and diarrhea.  Musculoskeletal: Negative for back pain and joint swelling.  Psychiatric/Behavioral: Positive for confusion.  All other systems reviewed and are negative.    Allergies  Biaxin; Demerol; Morphine and related; and Other  Home Medications   Current Outpatient Rx  Name Route Sig Dispense Refill  . ALBUTEROL SULFATE HFA 108 (90 BASE) MCG/ACT IN AERS Inhalation Inhale 2 puffs into the lungs every 6 (six) hours as needed. For shortness of breath     . ALLOPURINOL 300 MG PO TABS Oral Take 300 mg by mouth daily.      Marland Kitchen ALPRAZOLAM 0.5 MG PO TABS Oral Take 0.5 mg by mouth 3 (  three) times daily as needed. For anxiety    . AMLODIPINE BESYLATE 5 MG PO TABS Oral Take 5 mg by mouth daily.      . ATENOLOL 100 MG PO TABS Oral Take 200 mg by mouth daily.     Marland Kitchen CALCITRIOL 0.25 MCG PO CAPS Oral Take 0.25 mcg by mouth daily.      Marland Kitchen CITALOPRAM HYDROBROMIDE 20 MG PO TABS Oral Take 40 mg by mouth daily.     . CYCLOBENZAPRINE HCL 10 MG PO TABS Oral Take 10 mg by mouth 3 (three) times daily as needed. For muscle spasms    . DOXAZOSIN MESYLATE 8 MG PO TABS Oral Take 8 mg by mouth 2 (two) times daily.      Marland Kitchen ESOMEPRAZOLE MAGNESIUM 40 MG PO CPDR Oral Take 40 mg by mouth daily before breakfast.      . FLUTICASONE-SALMETEROL 250-50 MCG/DOSE IN AEPB Inhalation Inhale 1 puff into the lungs every 12 (twelve) hours.      . FUROSEMIDE 80 MG PO TABS Oral Take  160 mg by mouth every morning.      Marland Kitchen MONTELUKAST SODIUM 10 MG PO TABS Oral Take 10 mg by mouth at bedtime.      . NORETHINDRONE ACETATE 5 MG PO TABS Oral Take 10 mg by mouth daily.     . OXYCODONE HCL ER 10 MG PO TB12 Oral Take 10 mg by mouth every 12 (twelve) hours.      Marland Kitchen PROMETHAZINE HCL 25 MG PO TABS Oral Take 25 mg by mouth every 6 (six) hours as needed. For nausea      Triage Vitals: BP 124/67  Pulse 89  Temp(Src) 98.3 F (36.8 C) (Oral)  Resp 20  Ht 5\' 4"  (1.626 m)  Wt 275 lb (124.739 kg)  BMI 47.20 kg/m2  SpO2 100%  LMP 10/17/2011  Physical Exam  Nursing note and vitals reviewed. Constitutional: She is oriented to person, place, and time. She appears well-developed and well-nourished.  HENT:  Head: Normocephalic and atraumatic.  Eyes: Conjunctivae and EOM are normal.  Neck: Normal range of motion. Neck supple.  Cardiovascular: Normal rate, regular rhythm and normal heart sounds.   Pulmonary/Chest: Effort normal and breath sounds normal. No respiratory distress.  Abdominal: Soft. Bowel sounds are normal. There is no tenderness.  Musculoskeletal: Normal range of motion. She exhibits edema (Bilateral leg swelling, more prominent in the left).  Neurological: She is alert and oriented to person, place, and time. No cranial nerve deficit.  Skin: Skin is warm and dry.  Psychiatric: She has a normal mood and affect. Her behavior is normal.    ED Course  Procedures (including critical care time)  DIAGNOSTIC STUDIES: Oxygen Saturation is 98% on room air, normal by my interpretation.    Cardiac Monitor : 90's -SR - Normal   Date: 10/19/2011  Rate: 91  Rhythm: normal sinus rhythm  QRS Axis: left  Intervals: normal  ST/T Wave abnormalities: nonspecific T wave changes  Conduction Disutrbances:first-degree A-V block   Narrative Interpretation:   Old EKG Reviewed: unchanged  ABNORMAL ECG   COORDINATION OF CARE: 8:30PM-Discussed treatment plan with pt at bedside and pt  agreed to plan. Counseled pt on smoking cessation and pt stated that she was going to try.  Labs Reviewed  BASIC METABOLIC PANEL - Abnormal; Notable for the following:    Potassium 3.3 (*)    Glucose, Bld 175 (*)    BUN 67 (*)    Creatinine, Ser 6.30 (*)  GFR calc non Af Amer 8 (*)    GFR calc Af Amer 9 (*)    All other components within normal limits  CBC - Abnormal; Notable for the following:    WBC 11.7 (*)    RBC 2.87 (*)    Hemoglobin 8.1 (*)    HCT 26.2 (*)    RDW 16.4 (*)    All other components within normal limits  URINALYSIS, ROUTINE W REFLEX MICROSCOPIC - Abnormal; Notable for the following:    Hgb urine dipstick TRACE (*)    Protein, ur 100 (*)    All other components within normal limits  URINE MICROSCOPIC-ADD ON   Dg Chest 2 View  10/18/2011  *RADIOLOGY REPORT*  Clinical Data: Chest pain with tightness.  CHEST - 2 VIEW  Comparison: None.  Findings: Cardiomegaly is present.  There is mild vascular congestion but no infiltrates or failure.  Bones unremarkable. Little change from priors.  IMPRESSION: Cardiomegaly.  Mild vascular congestion.  No overt infiltrates or failure.  Original Report Authenticated By: Staci Righter, M.D.     No diagnosis found.    MDM  I personally performed the services described in this documentation, which was scribed in my presence. The recorded information has been reviewed and considered.  This 40 year old female with known renal dysfunction now presents with shortness of breath, generalized discomfort, and progressive lower extremity edema. On exam the patient is a comfortable though in no distress. The patient's vital signs are stable, and her ECG does not demonstrate evidence of acute electrode abnormalities. The patient's labs are similarly reassuring in terms of liquids, though the creatinine has a 50% increase since her most recent evaluation. Given this acute change in her renal function, her presentation with fluid retention,  dyspnea, discomfort the patient will be admitted for further evaluation and management.   Carmin Muskrat, MD 10/19/11 0010

## 2011-10-18 NOTE — ED Notes (Signed)
Pt report called and given to [place], RN on [id number].

## 2011-10-18 NOTE — ED Notes (Signed)
Pt has fistula in left arm, + thrill. Pt states that fistula has not been used yet.

## 2011-10-18 NOTE — ED Notes (Signed)
MD at bedside to discuss plan of care

## 2011-10-18 NOTE — ED Notes (Signed)
Pt has an hx of asthma and renal failure and has been sick with a cold. Pt does smoke but presented to ED with some congestion. Pt states that when she does cough she has a dark green/yellow sputum. Pt doesn't appear to be in any respiratory distress.

## 2011-10-18 NOTE — ED Notes (Signed)
Pt assisted to [facility] per pt request.

## 2011-10-18 NOTE — ED Notes (Signed)
Pt report given to Jason, RN.

## 2011-10-18 NOTE — ED Notes (Signed)
[facility] here for pt transport. NAD noted, IV site unremarkable.

## 2011-10-18 NOTE — ED Notes (Signed)
Cp that started last night and some sob ? A lot of fluid build up. Renal failure pt has shunt not using it  yet

## 2011-10-18 NOTE — ED Notes (Signed)
Pt c/o cp , SOB and leg swelling x 2 days  HX renal failure

## 2011-10-19 ENCOUNTER — Encounter (HOSPITAL_COMMUNITY): Payer: Self-pay | Admitting: Internal Medicine

## 2011-10-19 DIAGNOSIS — N179 Acute kidney failure, unspecified: Secondary | ICD-10-CM | POA: Diagnosis present

## 2011-10-19 DIAGNOSIS — I10 Essential (primary) hypertension: Secondary | ICD-10-CM | POA: Diagnosis present

## 2011-10-19 DIAGNOSIS — M7989 Other specified soft tissue disorders: Secondary | ICD-10-CM

## 2011-10-19 DIAGNOSIS — D649 Anemia, unspecified: Secondary | ICD-10-CM | POA: Diagnosis present

## 2011-10-19 DIAGNOSIS — M79609 Pain in unspecified limb: Secondary | ICD-10-CM

## 2011-10-19 LAB — COMPREHENSIVE METABOLIC PANEL
CO2: 19 mEq/L (ref 19–32)
Calcium: 9.3 mg/dL (ref 8.4–10.5)
Creatinine, Ser: 6.11 mg/dL — ABNORMAL HIGH (ref 0.50–1.10)
GFR calc Af Amer: 9 mL/min — ABNORMAL LOW (ref >90)
GFR calc non Af Amer: 8 mL/min — ABNORMAL LOW (ref >90)
Glucose, Bld: 120 mg/dL — ABNORMAL HIGH (ref 70–99)
Total Protein: 6.7 g/dL (ref 6.0–8.3)

## 2011-10-19 LAB — CBC
HCT: 23.9 % — ABNORMAL LOW (ref 36.0–46.0)
Hemoglobin: 7.6 g/dL — ABNORMAL LOW (ref 12.0–15.0)
RBC: 2.64 MIL/uL — ABNORMAL LOW (ref 3.87–5.11)

## 2011-10-19 LAB — CARDIAC PANEL(CRET KIN+CKTOT+MB+TROPI)
CK, MB: 1.4 ng/mL (ref 0.3–4.0)
Relative Index: INVALID (ref 0.0–2.5)
Relative Index: INVALID (ref 0.0–2.5)
Total CK: 87 U/L (ref 7–177)
Troponin I: <0.3 ng/mL (ref <0.30)
Troponin I: <0.3 ng/mL (ref <0.30)

## 2011-10-19 LAB — TYPE AND SCREEN
ABO/RH(D): A POS
Antibody Screen: NEGATIVE

## 2011-10-19 LAB — PREGNANCY, URINE: Preg Test, Ur: NEGATIVE

## 2011-10-19 LAB — PHOSPHORUS: Phosphorus: 6.7 mg/dL — ABNORMAL HIGH (ref 2.3–4.6)

## 2011-10-19 LAB — URIC ACID: Uric Acid, Serum: 7.1 mg/dL — ABNORMAL HIGH (ref 2.4–7.0)

## 2011-10-19 LAB — GLUCOSE, CAPILLARY: Glucose-Capillary: 119 mg/dL — ABNORMAL HIGH (ref 70–99)

## 2011-10-19 MED ORDER — DARBEPOETIN ALFA-POLYSORBATE 200 MCG/0.4ML IJ SOLN
200.0000 ug | Freq: Once | INTRAMUSCULAR | Status: AC
  Administered 2011-10-19: 200 ug via INTRAVENOUS
  Filled 2011-10-19: qty 0.4

## 2011-10-19 MED ORDER — DOXAZOSIN MESYLATE 8 MG PO TABS
8.0000 mg | ORAL_TABLET | Freq: Two times a day (BID) | ORAL | Status: DC
  Administered 2011-10-19 – 2011-10-20 (×3): 8 mg via ORAL
  Filled 2011-10-19 (×4): qty 1

## 2011-10-19 MED ORDER — OXYCODONE HCL 10 MG PO TB12
10.0000 mg | ORAL_TABLET | Freq: Two times a day (BID) | ORAL | Status: DC
  Administered 2011-10-19 – 2011-10-20 (×5): 10 mg via ORAL
  Filled 2011-10-19 (×6): qty 1

## 2011-10-19 MED ORDER — FUROSEMIDE 10 MG/ML IJ SOLN
80.0000 mg | Freq: Three times a day (TID) | INTRAMUSCULAR | Status: DC
  Administered 2011-10-19 (×2): 80 mg via INTRAVENOUS
  Filled 2011-10-19 (×4): qty 8

## 2011-10-19 MED ORDER — CALCITRIOL 0.25 MCG PO CAPS
0.2500 ug | ORAL_CAPSULE | Freq: Every day | ORAL | Status: DC
  Administered 2011-10-19 – 2011-10-20 (×2): 0.25 ug via ORAL
  Filled 2011-10-19 (×3): qty 1

## 2011-10-19 MED ORDER — HYDRALAZINE HCL 20 MG/ML IJ SOLN: 10.0000 mg | INTRAMUSCULAR | Status: DC | PRN

## 2011-10-19 MED ORDER — ALBUTEROL SULFATE (5 MG/ML) 0.5% IN NEBU
INHALATION_SOLUTION | RESPIRATORY_TRACT | Status: AC
  Filled 2011-10-19: qty 0.5

## 2011-10-19 MED ORDER — SODIUM CHLORIDE 0.9 % IJ SOLN
3.0000 mL | Freq: Two times a day (BID) | INTRAMUSCULAR | Status: DC
  Administered 2011-10-19 – 2011-10-20 (×5): 3 mL via INTRAVENOUS

## 2011-10-19 MED ORDER — ALLOPURINOL 300 MG PO TABS
300.0000 mg | ORAL_TABLET | Freq: Every day | ORAL | Status: DC
  Administered 2011-10-19: 300 mg via ORAL
  Filled 2011-10-19 (×2): qty 1

## 2011-10-19 MED ORDER — FERUMOXYTOL INJECTION 510 MG/17 ML
510.0000 mg | Freq: Once | INTRAVENOUS | Status: AC
  Administered 2011-10-19: 510 mg via INTRAVENOUS
  Filled 2011-10-19: qty 17

## 2011-10-19 MED ORDER — ALPRAZOLAM 0.5 MG PO TABS
0.5000 mg | ORAL_TABLET | Freq: Three times a day (TID) | ORAL | Status: DC | PRN
  Administered 2011-10-19: 0.5 mg via ORAL
  Filled 2011-10-19: qty 1

## 2011-10-19 MED ORDER — MONTELUKAST SODIUM 10 MG PO TABS
10.0000 mg | ORAL_TABLET | Freq: Every day | ORAL | Status: DC
  Administered 2011-10-19 – 2011-10-20 (×2): 10 mg via ORAL
  Filled 2011-10-19 (×4): qty 1

## 2011-10-19 MED ORDER — NICOTINE 14 MG/24HR TD PT24
14.0000 mg | MEDICATED_PATCH | Freq: Every day | TRANSDERMAL | Status: DC
  Administered 2011-10-19 – 2011-10-20 (×2): 14 mg via TRANSDERMAL
  Filled 2011-10-19 (×5): qty 1

## 2011-10-19 MED ORDER — ONDANSETRON HCL 4 MG/2ML IJ SOLN: 4.0000 mg | Freq: Four times a day (QID) | INTRAMUSCULAR | Status: DC | PRN

## 2011-10-19 MED ORDER — IPRATROPIUM BROMIDE 0.02 % IN SOLN
0.5000 mg | Freq: Four times a day (QID) | RESPIRATORY_TRACT | Status: DC
  Administered 2011-10-19 (×2): 0.5 mg via RESPIRATORY_TRACT
  Filled 2011-10-19 (×2): qty 2.5

## 2011-10-19 MED ORDER — ALBUTEROL SULFATE (5 MG/ML) 0.5% IN NEBU: 2.5000 mg | INHALATION_SOLUTION | RESPIRATORY_TRACT | Status: DC | PRN

## 2011-10-19 MED ORDER — BUDESONIDE 0.5 MG/2ML IN SUSP
0.5000 mg | Freq: Two times a day (BID) | RESPIRATORY_TRACT | Status: DC
  Administered 2011-10-19: 0.5 mg via RESPIRATORY_TRACT
  Filled 2011-10-19 (×3): qty 2

## 2011-10-19 MED ORDER — IPRATROPIUM BROMIDE 0.02 % IN SOLN
RESPIRATORY_TRACT | Status: AC
  Filled 2011-10-19: qty 2.5

## 2011-10-19 MED ORDER — AMLODIPINE BESYLATE 5 MG PO TABS
5.0000 mg | ORAL_TABLET | Freq: Every day | ORAL | Status: DC
  Administered 2011-10-19 – 2011-10-20 (×2): 5 mg via ORAL
  Filled 2011-10-19 (×2): qty 1

## 2011-10-19 MED ORDER — CITALOPRAM HYDROBROMIDE 40 MG PO TABS
40.0000 mg | ORAL_TABLET | Freq: Every day | ORAL | Status: DC
  Administered 2011-10-19 – 2011-10-20 (×2): 40 mg via ORAL
  Filled 2011-10-19 (×3): qty 1

## 2011-10-19 MED ORDER — ACETAMINOPHEN 325 MG PO TABS
650.0000 mg | ORAL_TABLET | Freq: Four times a day (QID) | ORAL | Status: DC | PRN
  Administered 2011-10-19 – 2011-10-20 (×2): 650 mg via ORAL
  Filled 2011-10-19 (×2): qty 2

## 2011-10-19 MED ORDER — NORETHINDRONE ACETATE 5 MG PO TABS
10.0000 mg | ORAL_TABLET | Freq: Every day | ORAL | Status: DC
  Administered 2011-10-19 – 2011-10-20 (×2): 10 mg via ORAL
  Filled 2011-10-19 (×6): qty 2

## 2011-10-19 MED ORDER — ATENOLOL 100 MG PO TABS
100.0000 mg | ORAL_TABLET | Freq: Every day | ORAL | Status: DC
  Filled 2011-10-19: qty 1

## 2011-10-19 MED ORDER — ONDANSETRON HCL 4 MG PO TABS: 4.0000 mg | ORAL_TABLET | Freq: Four times a day (QID) | ORAL | Status: DC | PRN

## 2011-10-19 MED ORDER — PANTOPRAZOLE SODIUM 40 MG PO TBEC
80.0000 mg | DELAYED_RELEASE_TABLET | Freq: Every day | ORAL | Status: DC
  Administered 2011-10-19 – 2011-10-20 (×2): 80 mg via ORAL
  Filled 2011-10-19: qty 2
  Filled 2011-10-19 (×2): qty 1

## 2011-10-19 MED ORDER — FUROSEMIDE 80 MG PO TABS
160.0000 mg | ORAL_TABLET | Freq: Three times a day (TID) | ORAL | Status: DC
  Administered 2011-10-19 – 2011-10-21 (×5): 160 mg via ORAL
  Filled 2011-10-19 (×8): qty 2

## 2011-10-19 MED ORDER — ACETAMINOPHEN 650 MG RE SUPP: 650.0000 mg | Freq: Four times a day (QID) | RECTAL | Status: DC | PRN

## 2011-10-19 MED ORDER — PROMETHAZINE HCL 25 MG PO TABS: 25.0000 mg | ORAL_TABLET | Freq: Four times a day (QID) | ORAL | Status: DC | PRN

## 2011-10-19 MED ORDER — FLUTICASONE-SALMETEROL 250-50 MCG/DOSE IN AEPB
1.0000 | INHALATION_SPRAY | Freq: Two times a day (BID) | RESPIRATORY_TRACT | Status: DC
  Administered 2011-10-19 – 2011-10-21 (×5): 1 via RESPIRATORY_TRACT
  Filled 2011-10-19: qty 14

## 2011-10-19 MED ORDER — COLCHICINE 0.6 MG PO TABS
0.6000 mg | ORAL_TABLET | Freq: Every day | ORAL | Status: DC
  Administered 2011-10-19: 0.6 mg via ORAL
  Filled 2011-10-19 (×2): qty 1

## 2011-10-19 MED ORDER — CYCLOBENZAPRINE HCL 10 MG PO TABS
10.0000 mg | ORAL_TABLET | Freq: Three times a day (TID) | ORAL | Status: DC | PRN
  Administered 2011-10-19 (×2): 10 mg via ORAL
  Filled 2011-10-19 (×2): qty 1

## 2011-10-19 MED ORDER — CALCIUM ACETATE 667 MG PO CAPS
667.0000 mg | ORAL_CAPSULE | Freq: Three times a day (TID) | ORAL | Status: DC
  Administered 2011-10-19 – 2011-10-21 (×5): 667 mg via ORAL
  Filled 2011-10-19 (×8): qty 1

## 2011-10-19 MED ORDER — ATENOLOL 100 MG PO TABS
100.0000 mg | ORAL_TABLET | Freq: Two times a day (BID) | ORAL | Status: DC
  Administered 2011-10-19: 100 mg via ORAL
  Filled 2011-10-19 (×2): qty 1

## 2011-10-19 MED ORDER — ALBUTEROL SULFATE HFA 108 (90 BASE) MCG/ACT IN AERS
2.0000 | INHALATION_SPRAY | RESPIRATORY_TRACT | Status: DC | PRN
  Filled 2011-10-19: qty 6.7

## 2011-10-19 MED ORDER — ALBUTEROL SULFATE (5 MG/ML) 0.5% IN NEBU
2.5000 mg | INHALATION_SOLUTION | Freq: Four times a day (QID) | RESPIRATORY_TRACT | Status: DC
  Administered 2011-10-19 (×2): 2.5 mg via RESPIRATORY_TRACT
  Filled 2011-10-19 (×2): qty 0.5

## 2011-10-19 MED ORDER — INFLUENZA VIRUS VACC SPLIT PF IM SUSP
0.5000 mL | INTRAMUSCULAR | Status: DC
  Filled 2011-10-19 (×2): qty 0.5

## 2011-10-19 MED ORDER — PNEUMOCOCCAL VAC POLYVALENT 25 MCG/0.5ML IJ INJ
0.5000 mL | INJECTION | INTRAMUSCULAR | Status: DC
  Filled 2011-10-19: qty 0.5

## 2011-10-19 NOTE — Clinical Documentation Improvement (Signed)
BMI DOCUMENTATION CLARIFICATION QUERY  THIS DOCUMENT IS NOT A PERMANENT PART OF THE MEDICAL RECORD  TO RESPOND TO THE THIS QUERY, FOLLOW THE INSTRUCTIONS BELOW:  1. If needed, update documentation for the patient's encounter via the notes activity.  2. Access this query again and click edit on the [employer].  3. After updating, or not, click F2 to complete all highlighted (required) fields concerning your review. Select "additional documentation in the medical record" OR "no additional documentation provided".  4. Click Sign note button.  5. The deficiency will fall out of your In Basket *Please let us know if you are not able to complete this workflow by phone or e-mail (listed below).         10/19/11  Dear Dr. Aileen Fass [name].  Please consider the below (if your clinical findings/judgment agree) as you document the patient's condition(s) in the progress note and discharge summary. Thank you!  In an effort to better capture your patient's severity of illness, reflect appropriate length of stay and utilization of resources, a review of the patient medical record has revealed the following indicators.      Possible Clinical conditions:  Morbid Obesity W/ BMI= 47.7  Underweight w/BMI=  Other condition___________________  Cannot Clinically determine _____________   Supporting Information:  Signs & Symptoms:  Weight=277LBS  Height= 5'4"  BMI = 47.7       Reviewed:  no additional documentation provided  Thank You,  [place] Documentation Specialist: [phone] Pager  [place]

## 2011-10-19 NOTE — Progress Notes (Signed)
Hospitalist was paged two more times and pt and family spoke with House coverage.

## 2011-10-19 NOTE — Progress Notes (Addendum)
Subjective: Patient was seen earlier this morning. This is a late entry. Patient indicated that her chest discomfort and dyspnea had resolved. Her leg swellings were also better.  Objective: Blood pressure 120/80, pulse 85, temperature 98.6 F (37 C), temperature source Oral, resp. rate 20, height 5\' 4"  (1.626 m), weight 125.9 kg (277 lb 9 oz), last menstrual period 10/17/2011, SpO2 93.00%.  Intake/Output Summary (Last 24 hours) at 10/19/11 1750 Last data filed at 10/19/11 1700  Gross per 24 hour  Intake   1088 ml  Output   1750 ml  Net   -662 ml   General exam: Comfortable. Obese. Respiratory system: Occasional basal crackles. Clear. No increased work of breathing. Cardiovascular system: First and second heart sounds heard, regular. No JVD. Gastrointestinal system: Abdomen is nondistended, soft and normal bowel sounds heard. Central nervous system: Alert and oriented. No focal neurological deficits. Extremities: with 2+ bilateral pitting pedal edema.  Lab Results: Basic Metabolic Panel:  Basename 10/19/11 0255 10/18/11 2049  NA 140 139  K 3.2* 3.3*  CL 105 100  CO2 19 21  GLUCOSE 120* 175*  BUN 66* 67*  CREATININE 6.11* 6.30*  CALCIUM 9.3 9.9  MG -- --  PHOS 6.7* --   Liver Function Tests:  Basename 10/19/11 0255 10/18/11 1627  AST 10 13  ALT 9 12  ALKPHOS 47 53  BILITOT 0.3 0.2*  PROT 6.7 7.4  ALBUMIN 3.2* 3.6   No results found for this basename: LIPASE:2,AMYLASE:2 in the last 72 hours No results found for this basename: AMMONIA:2 in the last 72 hours CBC:  Basename 10/19/11 0255 10/18/11 2049 10/18/11 1627  WBC 10.9* 11.7* --  NEUTROABS -- -- 9.4*  HGB 7.6* 8.1* --  HCT 23.9* 26.2* --  MCV 90.5 91.3 --  PLT 307 373 --   Cardiac Enzymes:  Basename 10/19/11 1025 10/19/11 0254  CKTOTAL 74 87  CKMB 1.3 1.4  CKMBINDEX -- --  TROPONINI <0.30 <0.30    Studies/Results: Dg Chest 2 View  10/18/2011  *RADIOLOGY REPORT*  Clinical Data: Chest pain with  tightness.  CHEST - 2 VIEW  Comparison: None.  Findings: Cardiomegaly is present.  There is mild vascular congestion but no infiltrates or failure.  Bones unremarkable. Little change from priors.  IMPRESSION: Cardiomegaly.  Mild vascular congestion.  No overt infiltrates or failure.  Original Report Authenticated By: Staci Righter, M.D.   Bilateral lower extremity venous Dopplers: No DVT.  Medications: Scheduled Meds:   . allopurinol  300 mg Oral Daily  . amLODipine  5 mg Oral Daily  . atenolol  100 mg Oral QHS  . calcitRIOL  0.25 mcg Oral Daily  . calcium acetate  667 mg Oral TID WC  . citalopram  40 mg Oral Daily  . colchicine  0.6 mg Oral Daily  . darbepoetin (ARANESP) injection - DIALYSIS  200 mcg Intravenous Once  . doxazosin  8 mg Oral BID  . ferumoxytol  510 mg Intravenous Once  . Fluticasone-Salmeterol  1 puff Inhalation Q12H  . furosemide  160 mg Intravenous Once  . furosemide  160 mg Oral TID  . influenza  inactive virus vaccine  0.5 mL Intramuscular Tomorrow-1000  . LORazepam  0.5 mg Intravenous Once  . montelukast  10 mg Oral QHS  . nicotine  14 mg Transdermal Q breakfast  . norethindrone  10 mg Oral Daily  . oxyCODONE  10 mg Oral BID  . pantoprazole  80 mg Oral Daily  . pneumococcal 23 valent  vaccine  0.5 mL Intramuscular Tomorrow-1000  . sodium chloride  3 mL Intravenous Q12H  . DISCONTD: albuterol  2.5 mg Nebulization Q6H  . DISCONTD: atenolol  100 mg Oral BID  . DISCONTD: budesonide  0.5 mg Nebulization BID  . DISCONTD: furosemide  80 mg Intravenous Q8H  . DISCONTD: ipratropium  0.5 mg Nebulization Q6H   Continuous Infusions:  PRN Meds:.acetaminophen, acetaminophen, albuterol, ALPRAZolam, cyclobenzaprine, hydrALAZINE, ondansetron (ZOFRAN) IV, ondansetron, promethazine, DISCONTD: albuterol  Assessment/Plan: 1. Worsening chronic kidney disease, stage V with volume overload: Nephrology consult did. They're trying to diurese and consulting vascular surgeon for AV  fistula. 2. Anemia, stable. Management per nephrology 3. Hypertension, controlled 4. Hypokalemia in patients with stage V chronic kidney disease. Follow daily BMP. No repleting now.   Addendum:  Morbid Obesity W/ BMI= 47.7   [name] 10/19/2011, 5:50 PM

## 2011-10-19 NOTE — H&P (Signed)
[name] is an 40 y.o. female.   PCP - Dr.Soatosky  Nephrologist - Dr Hassell Done. Chief Complaint: Shortness of breath and increasing swelling of her extremities. HPI: 40 year-old female with history of chronic kidney disease stage V present in the ER with increasing shortness of breath the last 2 days and increasing swelling of the lower extremities. Patient in addition had some chest tightness which has been persistent. Patient denies having missed her medications. In the ER patient was found to have creatinine of around 6.3. As per the family and patient her creatinine usually runs around 4. Patient has been admitted for further management off renal failure. Patient did have one episode of nausea and vomiting day before yesterday but she is able to eat well now. Denies any diarrhea, palpitations, dizziness, loss of consciousness. Denies any fever chills.  Past Medical History  Diagnosis Date  . Hypertension   . Anemia   . Arthritis   . Depression   . Reflux   . Dizziness   . Anxiety   . ADD (attention deficit disorder)   . Asthma   . Headache   . Chronic kidney disease     Atrophic left kidney  . Gout   . Sleep apnea     Past Surgical History  Procedure Date  . Cesarean section   . Av fistula placement 06/10/11    Left brachiocephalic AVF  . Spine surgery 2004    Lumbar diskectomy    Family History  Problem Relation Age of Onset  . Hypertension Mother   . Heart disease Father    Social History:  reports that she has been smoking Cigarettes.  She has been smoking about .5 packs per day. She does not have any smokeless tobacco history on file. She reports that she does not drink alcohol or use illicit drugs.  Allergies:  Allergies  Allergen Reactions  . Biaxin Nausea And Vomiting  . Demerol Nausea And Vomiting    Severe headaches  . Morphine And Related Nausea And Vomiting    Severe headache  . Other     Seeds - has diverticulitis     Medications Prior to  Admission  Medication Dose Route Frequency Provider Last Rate Last Dose  . acetaminophen (TYLENOL) tablet 650 mg  650 mg Oral Q6H PRN Rise Patience       Or  . acetaminophen (TYLENOL) suppository 650 mg  650 mg Rectal Q6H PRN Rise Patience      . albuterol (PROVENTIL) (5 MG/ML) 0.5% nebulizer solution 2.5 mg  2.5 mg Nebulization Q6H Rise Patience      . albuterol (PROVENTIL) (5 MG/ML) 0.5% nebulizer solution 2.5 mg  2.5 mg Nebulization Q2H PRN Rise Patience      . allopurinol (ZYLOPRIM) tablet 300 mg  300 mg Oral Daily Rise Patience      . ALPRAZolam Duanne Moron) tablet 0.5 mg  0.5 mg Oral TID PRN Rise Patience      . amLODipine (NORVASC) tablet 5 mg  5 mg Oral Daily Rise Patience      . atenolol (TENORMIN) tablet 200 mg  200 mg Oral Daily Rise Patience      . budesonide (PULMICORT) nebulizer solution 0.5 mg  0.5 mg Nebulization BID Rise Patience      . calcitRIOL (ROCALTROL) capsule 0.25 mcg  0.25 mcg Oral Daily Rise Patience      . citalopram (CELEXA) tablet 40 mg  40 mg  Oral Daily Rise Patience      . colchicine tablet 0.6 mg  0.6 mg Oral Daily Rise Patience      . cyclobenzaprine (FLEXERIL) tablet 10 mg  10 mg Oral TID PRN Rise Patience      . doxazosin (CARDURA) tablet 8 mg  8 mg Oral BID Rise Patience      . Fluticasone-Salmeterol (ADVAIR) 250-50 MCG/DOSE inhaler 1 puff  1 puff Inhalation Q12H Rise Patience      . furosemide (LASIX) 160 mg in dextrose 5 % 50 mL IVPB  160 mg Intravenous Once Carmin Muskrat, MD   160 mg at 10/18/11 2111  . furosemide (LASIX) injection 80 mg  80 mg Intravenous Q8H Rise Patience      . hydrALAZINE (APRESOLINE) injection 10 mg  10 mg Intravenous Q4H PRN Rise Patience      . ipratropium (ATROVENT) nebulizer solution 0.5 mg  0.5 mg Nebulization Q6H Rise Patience      . LORazepam (ATIVAN) injection 0.5 mg  0.5 mg Intravenous Once Carmin Muskrat, MD   0.5 mg at 10/18/11 2212  . montelukast (SINGULAIR) tablet 10 mg  10 mg Oral QHS Rise Patience      . norethindrone (AYGESTIN) tablet 10 mg  10 mg Oral Daily Rise Patience      . ondansetron (ZOFRAN) tablet 4 mg  4 mg Oral Q6H PRN Rise Patience       Or  . ondansetron (ZOFRAN) injection 4 mg  4 mg Intravenous Q6H PRN Rise Patience      . oxyCODONE (OXYCONTIN) 12 hr tablet 10 mg  10 mg Oral Q12H Rise Patience      . pantoprazole (PROTONIX) EC tablet 40 mg  40 mg Oral Daily Rise Patience      . promethazine (PHENERGAN) tablet 25 mg  25 mg Oral Q6H PRN Rise Patience      . sodium chloride 0.9 % injection 3 mL  3 mL Intravenous Q12H Rise Patience       Medications Prior to Admission  Medication Sig Dispense Refill  . allopurinol (ZYLOPRIM) 300 MG tablet Take 300 mg by mouth daily.        Marland Kitchen ALPRAZolam (XANAX) 0.5 MG tablet Take 0.5 mg by mouth 3 (three) times daily as needed. For anxiety      . amLODipine (NORVASC) 5 MG tablet Take 5 mg by mouth daily.        Marland Kitchen atenolol (TENORMIN) 100 MG tablet Take 200 mg by mouth daily.       . calcitRIOL (ROCALTROL) 0.25 MCG capsule Take 0.25 mcg by mouth daily.        . citalopram (CELEXA) 20 MG tablet Take 40 mg by mouth daily.       . cyclobenzaprine (FLEXERIL) 10 MG tablet Take 10 mg by mouth 3 (three) times daily as needed. For muscle spasms      . doxazosin (CARDURA) 8 MG tablet Take 8 mg by mouth 2 (two) times daily.        Marland Kitchen esomeprazole (NEXIUM) 40 MG capsule Take 40 mg by mouth daily before breakfast.        . montelukast (SINGULAIR) 10 MG tablet Take 10 mg by mouth at bedtime.        . norethindrone (AYGESTIN) 5 MG tablet Take 10 mg by mouth daily.       Marland Kitchen oxyCODONE (OXYCONTIN) 10 MG  12 hr tablet Take 10 mg by mouth every 12 (twelve) hours.        . promethazine (PHENERGAN) 25 MG tablet Take 25 mg by mouth every 6 (six) hours as needed. For nausea        Results for orders  placed during the hospital encounter of 10/18/11 (from the past 48 hour(s))  BASIC METABOLIC PANEL     Status: Abnormal   Collection Time   10/18/11  8:49 PM      Component Value Range Comment   Sodium 139  135 - 145 (mEq/L)    Potassium 3.3 (*) 3.5 - 5.1 (mEq/L)    Chloride 100  96 - 112 (mEq/L)    CO2 21  19 - 32 (mEq/L)    Glucose, Bld 175 (*) 70 - 99 (mg/dL)    BUN 67 (*) 6 - 23 (mg/dL)    Creatinine, Ser 6.30 (*) 0.50 - 1.10 (mg/dL)    Calcium 9.9  8.4 - 10.5 (mg/dL)    GFR calc non Af Amer 8 (*) >90 (mL/min)    GFR calc Af Amer 9 (*) >90 (mL/min)   CBC     Status: Abnormal   Collection Time   10/18/11  8:49 PM      Component Value Range Comment   WBC 11.7 (*) 4.0 - 10.5 (K/uL)    RBC 2.87 (*) 3.87 - 5.11 (MIL/uL)    Hemoglobin 8.1 (*) 12.0 - 15.0 (g/dL)    HCT 26.2 (*) 36.0 - 46.0 (%)    MCV 91.3  78.0 - 100.0 (fL)    MCH 28.2  26.0 - 34.0 (pg)    MCHC 30.9  30.0 - 36.0 (g/dL)    RDW 16.4 (*) 11.5 - 15.5 (%)    Platelets 373  150 - 400 (K/uL)   URINALYSIS, ROUTINE W REFLEX MICROSCOPIC     Status: Abnormal   Collection Time   10/18/11  9:41 PM      Component Value Range Comment   Color, Urine YELLOW  YELLOW     APPearance CLEAR  CLEAR     Specific Gravity, Urine 1.010  1.005 - 1.030     pH 5.5  5.0 - 8.0     Glucose, UA NEGATIVE  NEGATIVE (mg/dL)    Hgb urine dipstick TRACE (*) NEGATIVE     Bilirubin Urine NEGATIVE  NEGATIVE     Ketones, ur NEGATIVE  NEGATIVE (mg/dL)    Protein, ur 100 (*) NEGATIVE (mg/dL)    Urobilinogen, UA 0.2  0.0 - 1.0 (mg/dL)    Nitrite NEGATIVE  NEGATIVE     Leukocytes, UA NEGATIVE  NEGATIVE    URINE MICROSCOPIC-ADD ON     Status: Normal   Collection Time   10/18/11  9:41 PM      Component Value Range Comment   Squamous Epithelial / LPF RARE  RARE     RBC / HPF 0-2  <3 (RBC/hpf)    Bacteria, UA RARE  RARE     Dg Chest 2 View  10/18/2011  *RADIOLOGY REPORT*  Clinical Data: Chest pain with tightness.  CHEST - 2 VIEW  Comparison: None.   Findings: Cardiomegaly is present.  There is mild vascular congestion but no infiltrates or failure.  Bones unremarkable. Little change from priors.  IMPRESSION: Cardiomegaly.  Mild vascular congestion.  No overt infiltrates or failure.  Original Report Authenticated By: Staci Righter, M.D.    Review of Systems  Constitutional: Negative.   HENT: Negative.  Eyes: Negative.   Respiratory: Positive for shortness of breath.   Cardiovascular: Negative.   Gastrointestinal: Positive for nausea.  Genitourinary: Negative.   Musculoskeletal:       LOWER EXTREMITY SWELLING.  Skin: Negative.   Endo/Heme/Allergies: Negative.   Psychiatric/Behavioral: Negative.     Blood pressure 126/82, pulse 87, temperature 98.6 F (37 C), temperature source Oral, resp. rate 22, height 5\' 4"  (1.626 m), weight 125.9 kg (277 lb 9 oz), last menstrual period 10/17/2011, SpO2 97.00%. Physical Exam  Constitutional: She is oriented to person, place, and time. She appears well-developed and well-nourished. No distress.  HENT:  Head: Normocephalic and atraumatic.  Right Ear: External ear normal.  Left Ear: External ear normal.  Nose: Nose normal.  Mouth/Throat: Oropharynx is clear and moist. No oropharyngeal exudate.  Eyes: Conjunctivae and EOM are normal. Pupils are equal, round, and reactive to light.  Neck: Neck supple. No JVD present.  Cardiovascular: Normal rate, regular rhythm and normal heart sounds.   Respiratory: Effort normal. No stridor. No respiratory distress. She has wheezes. She has no rales.  GI: Soft. Bowel sounds are normal. She exhibits no distension. There is no tenderness. There is no rebound.  Musculoskeletal: She exhibits edema. She exhibits no tenderness.       2+ EDEMA IN BOTH LOWER EXTREMITIES.  Neurological: She is alert and oriented to person, place, and time.       Moves upper and lower extremities 5/5.  Skin: Skin is warm and dry. No rash noted. She is not diaphoretic. No erythema.    Psychiatric: Her behavior is normal.     Assessment/Plan #1. Acute renal failure on chronic kidney disease with solitary kidney  - patient's creatinine is around 6.3 and family and patient states that usually her creatinine runs around 4. Patient is on Lasix and has received 160 mg Lasix in the ER. She states she did make urine and not oliguric. Presently she is lying comfortable on the bed. We will continue with Lasix 80 mg IV every 8 hours with close followup of metabolic panel, intake output and daily weights. Will consult nephrologist for further recommendations. Patient does have AV fistula on her left arm. At this time I will also order Doppler of the lower extremity to rule out DVT. We'll also check uric acid levels. Her chest discomfort also could be from the fluid overload status. We'll cycle cardiac markers. Presently patient is not in acute distress. #2. Anemia - probably from chronic kidney disease and dysfunctional uterine bleeding. We will closely follow her CBC. #3. History of gout - will check uric acid level. May have to discontinue allopurinol for any further worsening of creatinine. #4. History of COPD/asthma with ongoing tobacco abuse - we will continue with nebulizers. Patient will need tobacco abuse counseling. #5. Chronic low back pain - continue present pain relief medications. #6. History of hypertension - continue present medications.  CODE STATUS - full code.  Angeleen Horney N. 10/19/2011, 2:32 AM

## 2011-10-19 NOTE — Consult Note (Signed)
Reason for Consult:CKD 5 Referring Physician: Alnisha Solum is an 40 y.o. female.  HPI: 40 yr old female with hx of atrophic kidney and FSG in the other with progressive CKD with Cr 08/13/11 of 4.9 (GFR of 10), now presents with progressive edema and Cr of 6.3 (GFR 8).  Main c/o progressive edema, and fatigue.  No SOB, PND, orthopnea, but DOE.  Rare noturia.  On period so hematuria.  Receives Procrit.  Has secondary HPTH.  Meds:  Lasix 80 mg bid, Atenolol 100 mg bid, Abilify, Nexium, Xanax bid, Allopurinol 300 mg/d, Advair, Calcitriol tiw, Cardura 8 mg bid, Singulair, Celexa 30mg /d, Renvela 800/d. Quesy stomach but not N or V. No fever or chills.     Past Medical History  Diagnosis Date  . Hypertension   . Anemia   . Arthritis   . Depression   . Reflux   . Dizziness   . Anxiety   . ADD (attention deficit disorder)   . Asthma   . Headache   . Chronic kidney disease     Atrophic left kidney  . Gout   . Sleep apnea     Past Surgical History  Procedure Date  . Cesarean section   . Av fistula placement 06/10/11    Left brachiocephalic AVF  . Spine surgery 2004    Lumbar diskectomy    Family History  Problem Relation Age of Onset  . Hypertension Mother   . Heart disease Father     Social History:  reports that she has been smoking Cigarettes.  She has been smoking about .5 packs per day. She does not have any smokeless tobacco history on file. She reports that she does not drink alcohol or use illicit drugs.  Allergies:  Allergies  Allergen Reactions  . Biaxin Nausea And Vomiting  . Demerol Nausea And Vomiting    Severe headaches  . Morphine And Related Nausea And Vomiting    Severe headache  . Other     Seeds - has diverticulitis     Medications:  I have reviewed the patient's current medications. Prior to Admission:  Prescriptions prior to admission  Medication Sig Dispense Refill  . albuterol (PROVENTIL HFA;VENTOLIN HFA) 108 (90 BASE)  MCG/ACT inhaler Inhale 2 puffs into the lungs every 6 (six) hours as needed. For shortness of breath       . allopurinol (ZYLOPRIM) 300 MG tablet Take 300 mg by mouth daily.        Marland Kitchen ALPRAZolam (XANAX) 0.5 MG tablet Take 0.5 mg by mouth 3 (three) times daily as needed. For anxiety      . amLODipine (NORVASC) 5 MG tablet Take 5 mg by mouth daily.        Marland Kitchen atenolol (TENORMIN) 100 MG tablet Take 200 mg by mouth daily.       . calcitRIOL (ROCALTROL) 0.25 MCG capsule Take 0.25 mcg by mouth daily.        . citalopram (CELEXA) 20 MG tablet Take 40 mg by mouth daily.       . cyclobenzaprine (FLEXERIL) 10 MG tablet Take 10 mg by mouth 3 (three) times daily as needed. For muscle spasms      . doxazosin (CARDURA) 8 MG tablet Take 8 mg by mouth 2 (two) times daily.        Marland Kitchen esomeprazole (NEXIUM) 40 MG capsule Take 40 mg by mouth daily before breakfast.        . Fluticasone-Salmeterol (ADVAIR) 250-50 MCG/DOSE AEPB  Inhale 1 puff into the lungs every 12 (twelve) hours.        . furosemide (LASIX) 80 MG tablet Take 160 mg by mouth every morning.        . montelukast (SINGULAIR) 10 MG tablet Take 10 mg by mouth at bedtime.        . norethindrone (AYGESTIN) 5 MG tablet Take 10 mg by mouth daily.       Marland Kitchen oxyCODONE (OXYCONTIN) 10 MG 12 hr tablet Take 10 mg by mouth every 12 (twelve) hours.        . promethazine (PHENERGAN) 25 MG tablet Take 25 mg by mouth every 6 (six) hours as needed. For nausea           Results for orders placed during the hospital encounter of 10/18/11 (from the past 48 hour(s))  BASIC METABOLIC PANEL     Status: Abnormal   Collection Time   10/18/11  8:49 PM      Component Value Range Comment   Sodium 139  135 - 145 (mEq/L)    Potassium 3.3 (*) 3.5 - 5.1 (mEq/L)    Chloride 100  96 - 112 (mEq/L)    CO2 21  19 - 32 (mEq/L)    Glucose, Bld 175 (*) 70 - 99 (mg/dL)    BUN 67 (*) 6 - 23 (mg/dL)    Creatinine, Ser 6.30 (*) 0.50 - 1.10 (mg/dL)    Calcium 9.9  8.4 - 10.5 (mg/dL)    GFR  calc non Af Amer 8 (*) >90 (mL/min)    GFR calc Af Amer 9 (*) >90 (mL/min)   CBC     Status: Abnormal   Collection Time   10/18/11  8:49 PM      Component Value Range Comment   WBC 11.7 (*) 4.0 - 10.5 (K/uL)    RBC 2.87 (*) 3.87 - 5.11 (MIL/uL)    Hemoglobin 8.1 (*) 12.0 - 15.0 (g/dL)    HCT 26.2 (*) 36.0 - 46.0 (%)    MCV 91.3  78.0 - 100.0 (fL)    MCH 28.2  26.0 - 34.0 (pg)    MCHC 30.9  30.0 - 36.0 (g/dL)    RDW 16.4 (*) 11.5 - 15.5 (%)    Platelets 373  150 - 400 (K/uL)   URINALYSIS, ROUTINE W REFLEX MICROSCOPIC     Status: Abnormal   Collection Time   10/18/11  9:41 PM      Component Value Range Comment   Color, Urine YELLOW  YELLOW     APPearance CLEAR  CLEAR     Specific Gravity, Urine 1.010  1.005 - 1.030     pH 5.5  5.0 - 8.0     Glucose, UA NEGATIVE  NEGATIVE (mg/dL)    Hgb urine dipstick TRACE (*) NEGATIVE     Bilirubin Urine NEGATIVE  NEGATIVE     Ketones, ur NEGATIVE  NEGATIVE (mg/dL)    Protein, ur 100 (*) NEGATIVE (mg/dL)    Urobilinogen, UA 0.2  0.0 - 1.0 (mg/dL)    Nitrite NEGATIVE  NEGATIVE     Leukocytes, UA NEGATIVE  NEGATIVE    URINE MICROSCOPIC-ADD ON     Status: Normal   Collection Time   10/18/11  9:41 PM      Component Value Range Comment   Squamous Epithelial / LPF RARE  RARE     RBC / HPF 0-2  <3 (RBC/hpf)    Bacteria, UA RARE  RARE    CARDIAC  PANEL(CRET KIN+CKTOT+MB+TROPI)     Status: Normal   Collection Time   10/19/11  2:54 AM      Component Value Range Comment   Total CK 87  7 - 177 (U/L)    CK, MB 1.4  0.3 - 4.0 (ng/mL)    Troponin I <0.30  <0.30 (ng/mL)    Relative Index RELATIVE INDEX IS INVALID  0.0 - 2.5    URIC ACID     Status: Abnormal   Collection Time   10/19/11  2:55 AM      Component Value Range Comment   Uric Acid, Serum 7.1 (*) 2.4 - 7.0 (mg/dL)   COMPREHENSIVE METABOLIC PANEL     Status: Abnormal   Collection Time   10/19/11  2:55 AM      Component Value Range Comment   Sodium 140  135 - 145 (mEq/L)    Potassium 3.2  (*) 3.5 - 5.1 (mEq/L)    Chloride 105  96 - 112 (mEq/L)    CO2 19  19 - 32 (mEq/L)    Glucose, Bld 120 (*) 70 - 99 (mg/dL)    BUN 66 (*) 6 - 23 (mg/dL)    Creatinine, Ser 6.11 (*) 0.50 - 1.10 (mg/dL)    Calcium 9.3  8.4 - 10.5 (mg/dL)    Total Protein 6.7  6.0 - 8.3 (g/dL)    Albumin 3.2 (*) 3.5 - 5.2 (g/dL)    AST 10  0 - 37 (U/L)    ALT 9  0 - 35 (U/L)    Alkaline Phosphatase 47  39 - 117 (U/L)    Total Bilirubin 0.3  0.3 - 1.2 (mg/dL)    GFR calc non Af Amer 8 (*) >90 (mL/min)    GFR calc Af Amer 9 (*) >90 (mL/min)   CBC     Status: Abnormal   Collection Time   10/19/11  2:55 AM      Component Value Range Comment   WBC 10.9 (*) 4.0 - 10.5 (K/uL)    RBC 2.64 (*) 3.87 - 5.11 (MIL/uL)    Hemoglobin 7.6 (*) 12.0 - 15.0 (g/dL)    HCT 23.9 (*) 36.0 - 46.0 (%)    MCV 90.5  78.0 - 100.0 (fL)    MCH 28.8  26.0 - 34.0 (pg)    MCHC 31.8  30.0 - 36.0 (g/dL)    RDW 16.9 (*) 11.5 - 15.5 (%)    Platelets 307  150 - 400 (K/uL)   PHOSPHORUS     Status: Abnormal   Collection Time   10/19/11  2:55 AM      Component Value Range Comment   Phosphorus 6.7 (*) 2.3 - 4.6 (mg/dL)   PREGNANCY, URINE     Status: Normal   Collection Time   10/19/11  4:01 AM      Component Value Range Comment   Preg Test, Ur NEGATIVE     TYPE AND SCREEN     Status: Normal   Collection Time   10/19/11  7:00 AM      Component Value Range Comment   ABO/RH(D) A POS      Antibody Screen NEG      Sample Expiration 10/22/2011     CARDIAC PANEL(CRET KIN+CKTOT+MB+TROPI)     Status: Normal   Collection Time   10/19/11 10:25 AM      Component Value Range Comment   Total CK 74  7 - 177 (U/L)    CK, MB 1.3  0.3 -  4.0 (ng/mL)    Troponin I <0.30  <0.30 (ng/mL)    Relative Index RELATIVE INDEX IS INVALID  0.0 - 2.5      Dg Chest 2 View  10/18/2011  *RADIOLOGY REPORT*  Clinical Data: Chest pain with tightness.  CHEST - 2 VIEW  Comparison: None.  Findings: Cardiomegaly is present.  There is mild vascular congestion but  no infiltrates or failure.  Bones unremarkable. Little change from priors.  IMPRESSION: Cardiomegaly.  Mild vascular congestion.  No overt infiltrates or failure.  Original Report Authenticated By: Staci Righter, M.D.    @ROS @ Blood pressure 120/80, pulse 85, temperature 98.6 F (37 C), temperature source Oral, resp. rate 20, height 5\' 4"  (1.626 m), weight 125.9 kg (277 lb 9 oz), last menstrual period 10/17/2011, SpO2 93.00%. @PHYSEXAMBYAGE2 @ Physical Examination: General appearance - alert, well appearing, and in no distress Mental status - alert, oriented to person, place, and time Eyes - pupils equal and reactive, extraocular eye movements intact, funduscopic exam normal, discs flat and sharp Mouth - mucous membranes moist, pharynx normal without lesions Lymphatics - no palpable lymphadenopathy, no hepatosplenomegaly, posterior cervical nodes Chest - diminished BS Heart - normal rate, regular rhythm, normal S1, S2, GR 2/6 holosys M at apex,  S4 present,  murmur 2 plus edema Abdomen - obese, liver down 5 cm Extremities - peripheral pulses normal, no pedal edema, no clubbing or cyanosis, AVF LUA, dilated at anastamosis but small in mid UA Skin - Bronzed  Assessment/Plan: 1 CKD 5 small change in GFR, vol xs, mild uremia.  Will try to diurese and get VVS to eval AVF.   2Anemia Needs Fe and higher dose EPO 3 Hypertension: will simplify regimen 4 Obesity 5. [place] P^, use binders, check PTH 6 Chronic HA  P diruesis, VVS to eval fistula, EPO, Fe   Kechia Yahnke L 10/19/2011, 4:16 PM

## 2011-10-19 NOTE — Progress Notes (Signed)
*  PRELIMINARY RESULTS* Lower venous dopplers performed. Preliminary findings showed no obvious evidence of DVT, superficial thrombus or Bakers cyst bilateral.  Mariann Barter 10/19/2011, 8:33 AM

## 2011-10-19 NOTE — Progress Notes (Signed)
Called and spoke with pt's daughter top update her.

## 2011-10-19 NOTE — Progress Notes (Signed)
Pt arrived to the floor via stretcher accompanied by [facility] personnel. Transferred to the bed and admission assessment and history where completed. Complaints of pain in back and leg. Hospitalist on call paged.

## 2011-10-20 DIAGNOSIS — N186 End stage renal disease: Secondary | ICD-10-CM

## 2011-10-20 LAB — COMPREHENSIVE METABOLIC PANEL
Alkaline Phosphatase: 49 U/L (ref 39–117)
BUN: 69 mg/dL — ABNORMAL HIGH (ref 6–23)
Chloride: 104 mEq/L (ref 96–112)
Creatinine, Ser: 6.88 mg/dL — ABNORMAL HIGH (ref 0.50–1.10)
GFR calc Af Amer: 8 mL/min — ABNORMAL LOW (ref >90)
Glucose, Bld: 127 mg/dL — ABNORMAL HIGH (ref 70–99)
Potassium: 3.3 mEq/L — ABNORMAL LOW (ref 3.5–5.1)
Total Bilirubin: 0.2 mg/dL — ABNORMAL LOW (ref 0.3–1.2)

## 2011-10-20 LAB — CBC
HCT: 22.7 % — ABNORMAL LOW (ref 36.0–46.0)
MCH: 28.3 pg (ref 26.0–34.0)
MCV: 91.9 fL (ref 78.0–100.0)
RBC: 2.47 MIL/uL — ABNORMAL LOW (ref 3.87–5.11)
RDW: 17 % — ABNORMAL HIGH (ref 11.5–15.5)
WBC: 10.1 10*3/uL (ref 4.0–10.5)

## 2011-10-20 MED ORDER — COLCHICINE 0.6 MG PO TABS
0.3000 mg | ORAL_TABLET | Freq: Every day | ORAL | Status: DC
  Administered 2011-10-20: 0.3 mg via ORAL
  Filled 2011-10-20 (×2): qty 0.5

## 2011-10-20 MED ORDER — DOXAZOSIN MESYLATE 4 MG PO TABS
4.0000 mg | ORAL_TABLET | Freq: Every day | ORAL | Status: DC
  Filled 2011-10-20: qty 1

## 2011-10-20 MED ORDER — ATENOLOL 50 MG PO TABS
50.0000 mg | ORAL_TABLET | Freq: Every day | ORAL | Status: DC
  Administered 2011-10-20: 50 mg via ORAL
  Filled 2011-10-20 (×2): qty 1

## 2011-10-20 MED ORDER — ALLOPURINOL 100 MG PO TABS
200.0000 mg | ORAL_TABLET | Freq: Every day | ORAL | Status: DC
  Administered 2011-10-20: 200 mg via ORAL
  Filled 2011-10-20 (×2): qty 2

## 2011-10-20 NOTE — Progress Notes (Signed)
Subjective: Interval History: has complaints lower abdm pain.  Objective: Vital signs in last 24 hours: Temp:  [97.4 F (36.3 C)-98.6 F (37 C)] 97.4 F (36.3 C) (12/19 0900) Pulse Rate:  [80-88] 84  (12/19 0900) Resp:  [20] 20  (12/19 0900) BP: (96-120)/(59-80) 101/62 mmHg (12/19 0900) SpO2:  [93 %-97 %] 96 % (12/19 0900) Weight change:   Intake/Output from previous day: 12/18 0701 - 12/19 0700 In: 840 [P.O.:840] Out: 2100 [Urine:2100] Intake/Output this shift: Total I/O In: 240 [P.O.:240] Out: -   General appearance: alert, cooperative and morbidly obese Resp: diminished breath sounds bilaterally Cardio: S1, S2 normal and systolic murmur: systolic ejection 2/6, decrescendo at 2nd left intercostal space GI: obes, pos bs, mild tender RLQ Skin: bronzed avf Lua, thin mid portion  Lab Results:  [facility] 10/20/11 0605 10/19/11 0255  WBC 10.1 10.9*  HGB 7.0* 7.6*  HCT 22.7* 23.9*  PLT 286 307   BMET:  Basename 10/20/11 0605 10/19/11 0255  NA 141 140  K 3.3* 3.2*  CL 104 105  CO2 20 19  GLUCOSE 127* 120*  BUN 69* 66*  CREATININE 6.88* 6.11*  CALCIUM 9.5 9.3   No results found for this basename: PTH:2 in the last 72 hours Iron Studies: No results found for this basename: IRON,TIBC,TRANSFERRIN,FERRITIN in the last 72 hours  Studies/Results: Dg Chest 2 View  10/18/2011  *RADIOLOGY REPORT*  Clinical Data: Chest pain with tightness.  CHEST - 2 VIEW  Comparison: None.  Findings: Cardiomegaly is present.  There is mild vascular congestion but no infiltrates or failure.  Bones unremarkable. Little change from priors.  IMPRESSION: Cardiomegaly.  Mild vascular congestion.  No overt infiltrates or failure.  Original Report Authenticated By: Staci Righter, M.D.    I have reviewed the patient's current medications.  Assessment/Plan: 1 SKD Cr ^ diuresing, no indic for HD.  Needs access revision. 2BP falling, reduce meds 3 obestiy 4 Anemia EPO, fe 5 edema, less 6  anxiety 7 abdm pain ? ovaian  P access assess by VVS, diuretics, epo, fe    LOS: 2 days   [name] L 10/20/2011,12:33 PM

## 2011-10-20 NOTE — Consult Note (Signed)
[facility] [place]  Referred by:  Dr. Aileen Fass  Reason for referral: New access  History of Present Illness  [name] is a 40 y.o. female who presents for evaluation for permanent access.  The patient is right hand dominant.  The patient has a left brachiocepalic arteriovenous fistula previously placed.  She had a fistulogram completed which demonstrated no venous stenoses in the access.  Previous central venous cannulation procedures include: none.  The patient has never had a  PPM placed.  The nephrologist feels the access is not usable.  Past Medical History  Diagnosis Date  . Hypertension   . Anemia   . Arthritis   . Depression   . Reflux   . Dizziness   . Anxiety   . ADD (attention deficit disorder)   . Asthma   . Headache   . Chronic kidney disease     Atrophic left kidney  . Gout   . Sleep apnea     Past Surgical History  Procedure Date  . Cesarean section   . Av fistula placement 06/10/11    Left brachiocephalic AVF  . Spine surgery 2004    Lumbar diskectomy    History   Social History  . Marital Status: Divorced    Spouse Name: N/A    Number of Children: N/A  . Years of Education: N/A   Occupational History  . Not on file.   Social History Main Topics  . Smoking status: Current Everyday Smoker -- 0.5 packs/day    Types: Cigarettes  . Smokeless tobacco: Not on file  . Alcohol Use: No  . Drug Use: No  . Sexually Active:    Other Topics Concern  . Not on file   Social History Narrative  . No narrative on file    Family History  Problem Relation Age of Onset  . Hypertension Mother   . Heart disease Father     No current facility-administered medications on file prior to encounter.   Current Outpatient Prescriptions on File Prior to Encounter  Medication Sig Dispense Refill  . albuterol (PROVENTIL HFA;VENTOLIN HFA) 108 (90 BASE) MCG/ACT inhaler Inhale 2 puffs into the lungs every 6 (six) hours as  needed. For shortness of breath       . allopurinol (ZYLOPRIM) 300 MG tablet Take 300 mg by mouth daily.        Marland Kitchen ALPRAZolam (XANAX) 0.5 MG tablet Take 0.5 mg by mouth 3 (three) times daily as needed. For anxiety      . amLODipine (NORVASC) 5 MG tablet Take 5 mg by mouth daily.        Marland Kitchen atenolol (TENORMIN) 100 MG tablet Take 200 mg by mouth daily.       . calcitRIOL (ROCALTROL) 0.25 MCG capsule Take 0.25 mcg by mouth daily.        . citalopram (CELEXA) 20 MG tablet Take 40 mg by mouth daily.       . cyclobenzaprine (FLEXERIL) 10 MG tablet Take 10 mg by mouth 3 (three) times daily as needed. For muscle spasms      . doxazosin (CARDURA) 8 MG tablet Take 8 mg by mouth 2 (two) times daily.        Marland Kitchen esomeprazole (NEXIUM) 40 MG capsule Take 40 mg by mouth daily before breakfast.        . Fluticasone-Salmeterol (ADVAIR) 250-50 MCG/DOSE AEPB Inhale 1 puff into the lungs every 12 (twelve) hours.        Marland Kitchen  furosemide (LASIX) 80 MG tablet Take 160 mg by mouth every morning.        . montelukast (SINGULAIR) 10 MG tablet Take 10 mg by mouth at bedtime.        . norethindrone (AYGESTIN) 5 MG tablet Take 10 mg by mouth daily.       Marland Kitchen oxyCODONE (OXYCONTIN) 10 MG 12 hr tablet Take 10 mg by mouth every 12 (twelve) hours.        . promethazine (PHENERGAN) 25 MG tablet Take 25 mg by mouth every 6 (six) hours as needed. For nausea        Allergies  Allergen Reactions  . Biaxin Nausea And Vomiting  . Demerol Nausea And Vomiting    Severe headaches  . Morphine And Related Nausea And Vomiting    Severe headache  . Other     Seeds - has diverticulitis     REVIEW OF SYSTEMS:  (Positives indicated with an "x", otherwise negative)  CARDIOVASCULAR: [ ]  chest pain    [ ]  chest pressure    [ ]  palpitations    [ ]  orthopnea   [ ]  dyspnea on exert. [ ]  claudication    [ ]  rest pain     [ ]  DVT     [ ]  phlebitis  PULMONARY:    [ ]  productive cough [ ]  asthma  [ ]  wheezing  [x]  shortness of breath  NEUROLOGIC:    [ ]   weakness    [ ]  paresthesias   [ ]  aphasia    [ ]  amaurosis    [ ]  dizziness  HEMATOLOGIC:    [ ]  bleeding problems  [ ]  clotting disorders  MUSCULOSKEL: [x]  joint pain     [ ]  joint swelling  GASTROINTEST:  [ ]   blood in stool   [ ]   hematemesis  GENITOURINARY:   [ ]   dysuria    [ ]   hematuria  PSYCHIATRIC:   [ ]  history of major depression  INTEGUMENTARY: [ ]  rashes    [ ]  ulcers  CONSTITUTIONAL:  [ ]  fever     [ ]  chills  Physical Examination  Filed Vitals:   10/20/11 0520 10/20/11 0840 10/20/11 0900 10/20/11 1400  BP: 96/60  101/62 137/62  Pulse: 88  84 87  Temp: 98.3 F (36.8 C)  97.4 F (36.3 C) 98 F (36.7 C)  TempSrc: Oral  Oral Oral  Resp: 20  20 20   Height:      Weight:      SpO2: 97% 96% 96% 96%   Body mass index is 47.64 kg/(m^2).  General: A&O x 3, WDWN, morbidly obese  Head: [place]/AT  Ear/Nose/Throat: Hearing grossly intact, nares w/o erythema or drainage, oropharynx w/o Erythema/Exudate  Eyes: PERRLA, EOMI  Neck: Supple, no nuchal rigidity, no palpable LAD  Pulmonary: Sym exp, good air movt, CTAB, no rales, rhonchi, & wheezing  Cardiac: RRR, Nl S1, S2, no Murmurs, rubs or gallops  Vascular: Vessel Right Left  Radial Palpable Palpable  Brachial Palpable Palpable  Carotid Palpable, without bruit Palpable, without bruit  Aorta Non-palpable N/A  Femoral Palpable Palpable  Popliteal Non-palpable Non-palpable  PT Palpable Palpable  DP Palpable Palpable   Gastrointestinal: soft, NTND, -G/R, - HSM, - masses, - CVAT B  Musculoskeletal: M/S 5/5 throughout , Extremities without ischemic changes , Left arm with palpable thrill in brachiocephalic distally, more proximal segment is not easily palpable, bruit throughout the access  Neurologic: CN 2-12 intact , Pain  and light touch intact in extremities , Motor exam as listed above  Psychiatric: Judgment intact, Mood & affect appropriate for pt's clinical situation  Dermatologic: See M/S exam for  extremity exam, no rashes otherwise noted  Lymph : No Cervical, Axillary, or Inguinal lymphadenopathy   Laboratory CBC    Component Value Date/Time   WBC 10.1 10/20/2011 0605   RBC 2.47* 10/20/2011 0605   HGB 7.0* 10/20/2011 0605   HCT 22.7* 10/20/2011 0605   PLT 286 10/20/2011 0605   MCV 91.9 10/20/2011 0605   MCH 28.3 10/20/2011 0605   MCHC 30.8 10/20/2011 0605   RDW 17.0* 10/20/2011 0605   LYMPHSABS 2.0 10/18/2011 1627   MONOABS 0.5 10/18/2011 1627   EOSABS 0.0 10/18/2011 1627   BASOSABS 0.0 10/18/2011 1627    BMET    Component Value Date/Time   NA 141 10/20/2011 0605   K 3.3* 10/20/2011 0605   CL 104 10/20/2011 0605   CO2 20 10/20/2011 0605   GLUCOSE 127* 10/20/2011 0605   BUN 69* 10/20/2011 0605   CREATININE 6.88* 10/20/2011 0605   CALCIUM 9.5 10/20/2011 0605   CALCIUM 9.5 06/25/2011 1351   GFRNONAA 7* 10/20/2011 0605   GFRAA 8* 10/20/2011 0605    Medical Decision Making  [name] is a 40 y.o. female who presents with CKD Stage V  Based on her exam, I suspect the patient's proximal cephalic vein is too deep rather than there being a maturation issue  I would obtain L arm access duplex in the [facility] office and follow up with Dr. Scot Dock as any outpatient.  This workup is better done as an outpatient given the lack of familiarity of the inpatient vascular technicians with this duplex protocol.  Adele Barthel, MD [facility] [place] Office: [phone] Pager: [phone]  10/20/2011, 5:02 PM

## 2011-10-20 NOTE — Progress Notes (Signed)
Subjective: Patient relates no complaints. She relates her breathing is much improved compared to yesterday.  Objective: Filed Vitals:   10/19/11 2006 10/20/11 0520 10/20/11 0840 10/20/11 0900  BP: 100/59 96/60  101/62  Pulse: 85 88  84  Temp: 97.6 F (36.4 C) 98.3 F (36.8 C)  97.4 F (36.3 C)  TempSrc: Oral Oral  Oral  Resp: 20 20  20   Height:      Weight:      SpO2: 96% 97% 96% 96%   Weight change:   Intake/Output Summary (Last 24 hours) at 10/20/11 1409 Last data filed at 10/20/11 1100  Gross per 24 hour  Intake    480 ml  Output   1300 ml  Net   -820 ml    General: Alert, awake, oriented x3, in no acute distress.  HEENT: No bruits, no goiter.  Heart: Regular rate and rhythm, without murmurs, rubs, gallops.  Lungs: Good air movement clear to auscultation. Abdomen: Soft, nondistended, positive bowel sounds.  Neuro: Grossly intact, nonfocal.   Lab Results:  [facility] 10/20/11 0605 10/19/11 0255  NA 141 140  K 3.3* 3.2*  CL 104 105  CO2 20 19  GLUCOSE 127* 120*  BUN 69* 66*  CREATININE 6.88* 6.11*  CALCIUM 9.5 9.3  MG -- --  PHOS 7.7* 6.7*    Basename 10/20/11 0605 10/19/11 0255  AST 8 10  ALT 8 9  ALKPHOS 49 47  BILITOT 0.2* 0.3  PROT 6.5 6.7  ALBUMIN 3.2* 3.2*   No results found for this basename: LIPASE:2,AMYLASE:2 in the last 72 hours  Basename 10/20/11 0605 10/19/11 0255 10/18/11 1627  WBC 10.1 10.9* --  NEUTROABS -- -- 9.4*  HGB 7.0* 7.6* --  HCT 22.7* 23.9* --  MCV 91.9 90.5 --  PLT 286 307 --    Basename 10/19/11 1833 10/19/11 1025 10/19/11 0254  CKTOTAL 66 74 87  CKMB 1.3 1.3 1.4  CKMBINDEX -- -- --  TROPONINI <0.30 <0.30 <0.30    Micro Results: No results found for this or any previous visit (from the past 240 hour(s)).  Studies/Results: Dg Chest 2 View  10/18/2011  *RADIOLOGY REPORT*  Clinical Data: Chest pain with tightness.  CHEST - 2 VIEW  Comparison: None.  Findings: Cardiomegaly is present.  There is mild vascular  congestion but no infiltrates or failure.  Bones unremarkable. Little change from priors.  IMPRESSION: Cardiomegaly.  Mild vascular congestion.  No overt infiltrates or failure.  Original Report Authenticated By: Staci Righter, M.D.    Medications: I have reviewed the patient's current medications.   Principal Problem:  *Renal failure (ARF), acute on chronic Active Problems:  HTN (hypertension)  Anemia    Assessment and plan: -Patient has been diuresing well with Lasix dose, she's currently 160 3 times a day. Renal is on board, appreciate assistance. She had good urine output. She relates she has not been compliant with fluid restriction at home. Vascular surgery consult pending at this time.  -Her blood pressure has been stable continue current treatment. We'll continue Aranesp for her anemia, continue PhosLo for phosphorus.    LOS: 2 days   Charlynne Cousins M.D. Pager: [phone] [facility] 10/20/2011, 2:09 PM

## 2011-10-20 NOTE — Progress Notes (Signed)
10/20/2011 [place], [place] Case Management Note [phone]  Utilization review completed.

## 2011-10-21 LAB — CBC
MCH: 28.2 pg (ref 26.0–34.0)
MCHC: 30.7 g/dL (ref 30.0–36.0)
MCV: 91.9 fL (ref 78.0–100.0)
Platelets: 271 10*3/uL (ref 150–400)
RDW: 16.9 % — ABNORMAL HIGH (ref 11.5–15.5)

## 2011-10-21 LAB — RENAL FUNCTION PANEL
Albumin: 3.2 g/dL — ABNORMAL LOW (ref 3.5–5.2)
Calcium: 10.1 mg/dL (ref 8.4–10.5)
Creatinine, Ser: 7.08 mg/dL — ABNORMAL HIGH (ref 0.50–1.10)
GFR calc non Af Amer: 7 mL/min — ABNORMAL LOW (ref >90)
Phosphorus: 7.7 mg/dL — ABNORMAL HIGH (ref 2.3–4.6)

## 2011-10-21 MED ORDER — CALCIUM ACETATE 667 MG PO CAPS: 667.0000 mg | ORAL_CAPSULE | Freq: Three times a day (TID) | ORAL | Status: DC

## 2011-10-21 NOTE — Discharge Summary (Signed)
Admit date: 10/18/2011 Discharge date: 10/21/2011  Primary Care Physician:  Enid Skeens., MD   Discharge Diagnoses:   No resolved problems to display.  Active Hospital Problems  Diagnoses Date Noted   . Renal failure (ARF), acute on chronic 10/19/2011   . HTN (hypertension) 10/19/2011   . Anemia 10/19/2011     Resolved Hospital Problems  Diagnoses Date Noted Date Resolved     DISCHARGE MEDICATION: Current Discharge Medication List    START taking these medications   Details  calcium acetate (PHOSLO) 667 MG capsule Take 1 capsule (667 mg total) by mouth 3 (three) times daily with meals. Qty: 90 capsule, Refills: 0      CONTINUE these medications which have NOT CHANGED   Details  albuterol (PROVENTIL HFA;VENTOLIN HFA) 108 (90 BASE) MCG/ACT inhaler Inhale 2 puffs into the lungs every 6 (six) hours as needed. For shortness of breath     allopurinol (ZYLOPRIM) 300 MG tablet Take 300 mg by mouth daily.      ALPRAZolam (XANAX) 0.5 MG tablet Take 0.5 mg by mouth 3 (three) times daily as needed. For anxiety    amLODipine (NORVASC) 5 MG tablet Take 5 mg by mouth daily.      atenolol (TENORMIN) 100 MG tablet Take 200 mg by mouth daily.     calcitRIOL (ROCALTROL) 0.25 MCG capsule Take 0.25 mcg by mouth daily.      citalopram (CELEXA) 20 MG tablet Take 40 mg by mouth daily.     cyclobenzaprine (FLEXERIL) 10 MG tablet Take 10 mg by mouth 3 (three) times daily as needed. For muscle spasms    doxazosin (CARDURA) 8 MG tablet Take 8 mg by mouth 2 (two) times daily.      esomeprazole (NEXIUM) 40 MG capsule Take 40 mg by mouth daily before breakfast.      Fluticasone-Salmeterol (ADVAIR) 250-50 MCG/DOSE AEPB Inhale 1 puff into the lungs every 12 (twelve) hours.      furosemide (LASIX) 80 MG tablet Take 160 mg by mouth every morning.      montelukast (SINGULAIR) 10 MG tablet Take 10 mg by mouth at bedtime.      norethindrone (AYGESTIN) 5 MG tablet Take 10 mg by mouth daily.     oxyCODONE (OXYCONTIN) 10 MG 12 hr tablet Take 10 mg by mouth every 12 (twelve) hours.      promethazine (PHENERGAN) 25 MG tablet Take 25 mg by mouth every 6 (six) hours as needed. For nausea      STOP taking these medications     colchicine 0.6 MG tablet            Consults: Treatment Team:  Placido Sou, MD   SIGNIFICANT DIAGNOSTIC STUDIES:  Dg Chest 2 View  10/18/2011  *RADIOLOGY REPORT*  Clinical Data: Chest pain with tightness.  CHEST - 2 VIEW  Comparison: None.  Findings: Cardiomegaly is present.  There is mild vascular congestion but no infiltrates or failure.  Bones unremarkable. Little change from priors.  IMPRESSION: Cardiomegaly.  Mild vascular congestion.  No overt infiltrates or failure.  Original Report Authenticated By: Staci Righter, M.D.   Duplex of the lower extremity: Summary:  - No obvious evidence of deep vein or superficial thrombosis involving the right lower extremity and left lower extremity. - No obvious evidence of Baker's cyst on the right. Other specific details can be found in the table(s) above. Prepared and Electronically Authenticated by     No results found for this or any previous  visit (from the past 240 hour(s)).  BRIEF ADMITTING H & P: 40 year-old female with history of chronic kidney disease stage V present in the ER with increasing shortness of breath the last 2 days and increasing swelling of the lower extremities. Patient in addition had some chest tightness which has been persistent. Patient denies having missed her medications. In the ER patient was found to have creatinine of around 6.3. As per the family and patient her creatinine usually runs around 4. Patient has been admitted for further management off renal failure. Patient did have one episode of nausea and vomiting day before yesterday but she is able to eat well now. Denies any diarrhea, palpitations, dizziness, loss of consciousness. Denies any fever chills.   No  resolved problems to display.  Active Hospital Problems  Diagnoses Date Noted   . Renal failure (ARF), acute on chronic: Renal was consulted. He agreed to continue the Lasix at a higher dose she diureses over 3 L. Her shortness of breath improved. Conclusion was that the patient was probably not taking her medication adequately and not compliant with  fluid restriction. She continued to improve. Creatinine remained stable at 6.8-7.0 the swelling of the legs resolved Dopplex of the lower study was done that showed no DVT. Vascular surgery was consulted they recommended a duplex to followup as an outpatient to review the graft.  10/19/2011   . HTN (hypertension):  Blood pressure stable no changes were made  10/19/2011   . Anemia; On admission her hemoglobin was 7.6. And remained stable on the day of discharge was 7.3. She will follow up with renal as an outpatient and continue her aranesp. 10/19/2011     Resolved Hospital Problems  Diagnoses Date Noted Date Resolved     Disposition and Follow-up:  Discharge Orders    Future Appointments: Provider: Department: Dept Phone: Center:   10/25/2011 10:00 AM [facility]  None   11/08/2011 12:45 PM [facility]  None   11/29/2011 8:30 AM [facility] ED:[id number] None     Future Orders Please Complete By Expires   Diet - low sodium heart healthy      Increase activity slowly        Follow-up Information    Follow up with SLATOSKY,JOHN J. in 1 week. (As needed)       Follow up with DICKSON,CHRISTOPHER S, MD in 1 week. (dupplex)    Contact information:   [address] [place] [id number] [phone]           DISCHARGE EXAM:  General: Alert, awake, oriented x3, in no acute distress.  HEENT: No bruits, no goiter.  Heart: Regular rate and rhythm, without murmurs, rubs, gallops.  Lungs: Good air movement clear to auscultation.  Abdomen: Soft,  nondistended, positive bowel sounds.  Neuro: Grossly intact, nonfocal.   Blood pressure 111/67, pulse 82, temperature 98 F (36.7 C), temperature source Oral, resp. rate 20, height 5\' 4"  (1.626 m), weight 125.9 kg (277 lb 9 oz), last menstrual period 10/17/2011, SpO2 100.00%.   Basename 10/21/11 0540 10/20/11 0605  NA 142 141  K 3.6 3.3*  CL 103 104  CO2 20 20  GLUCOSE 101* 127*  BUN 72* 69*  CREATININE 7.08* 6.88*  CALCIUM 10.1 9.5  MG -- --  PHOS 7.7* 7.7*    Basename 10/21/11 0540 10/20/11 0605 10/19/11 0255  AST -- 8 10  ALT -- 8  9  ALKPHOS -- 49 47  BILITOT -- 0.2* 0.3  PROT -- 6.5 6.7  ALBUMIN 3.2* 3.2* --   No results found for this basename: LIPASE:2,AMYLASE:2 in the last 72 hours  Basename 10/21/11 0542 10/20/11 0605 10/18/11 1627  WBC 9.3 10.1 --  NEUTROABS -- -- 9.4*  HGB 7.3* 7.0* --  HCT 23.8* 22.7* --  MCV 91.9 91.9 --  PLT 271 286 --    Signed: Charlynne Cousins M.D. 10/21/2011, 8:00 AM

## 2011-10-25 ENCOUNTER — Encounter (HOSPITAL_COMMUNITY)
Admission: RE | Admit: 2011-10-25 | Discharge: 2011-10-25 | Disposition: A | Discharge status: Home / Self Care | Payer: Medicare Other | Source: Ambulatory Visit | Attending: Nephrology | Admitting: Nephrology

## 2011-10-25 ENCOUNTER — Encounter (HOSPITAL_COMMUNITY): Payer: Medicare Other

## 2011-10-25 LAB — FERRITIN: Ferritin: 1567 ng/mL — ABNORMAL HIGH (ref 10–291)

## 2011-10-25 LAB — IRON AND TIBC
Iron: 111 ug/dL (ref 42–135)
TIBC: 314 ug/dL (ref 250–470)
UIBC: 203 ug/dL (ref 125–400)

## 2011-10-25 MED ORDER — EPOETIN ALFA 10000 UNIT/ML IJ SOLN: 40000.0000 [IU] | INTRAMUSCULAR | Status: DC

## 2011-10-25 MED ORDER — EPOETIN ALFA 40000 UNIT/ML IJ SOLN
INTRAMUSCULAR | Status: AC
  Administered 2011-10-25: 40000 [IU] via SUBCUTANEOUS
  Filled 2011-10-25: qty 1

## 2011-11-03 DIAGNOSIS — Z79899 Other long term (current) drug therapy: Secondary | ICD-10-CM | POA: Diagnosis not present

## 2011-11-04 ENCOUNTER — Other Ambulatory Visit (HOSPITAL_COMMUNITY): Payer: Self-pay | Admitting: *Deleted

## 2011-11-05 DIAGNOSIS — Z79899 Other long term (current) drug therapy: Secondary | ICD-10-CM | POA: Diagnosis not present

## 2011-11-08 ENCOUNTER — Encounter (HOSPITAL_COMMUNITY)
Admission: RE | Admit: 2011-11-08 | Discharge: 2011-11-08 | Disposition: A | Discharge status: Home / Self Care | Payer: Medicare Other | Source: Ambulatory Visit | Attending: Nephrology | Admitting: Nephrology

## 2011-11-08 DIAGNOSIS — D649 Anemia, unspecified: Secondary | ICD-10-CM | POA: Insufficient documentation

## 2011-11-08 DIAGNOSIS — N186 End stage renal disease: Secondary | ICD-10-CM | POA: Diagnosis not present

## 2011-11-08 DIAGNOSIS — I120 Hypertensive chronic kidney disease with stage 5 chronic kidney disease or end stage renal disease: Secondary | ICD-10-CM | POA: Diagnosis not present

## 2011-11-08 LAB — RENAL FUNCTION PANEL
Albumin: 3.4 g/dL — ABNORMAL LOW (ref 3.5–5.2)
BUN: 63 mg/dL — ABNORMAL HIGH (ref 6–23)
Chloride: 99 mEq/L (ref 96–112)
Creatinine, Ser: 5.24 mg/dL — ABNORMAL HIGH (ref 0.50–1.10)
Glucose, Bld: 195 mg/dL — ABNORMAL HIGH (ref 70–99)
Potassium: 3.8 mEq/L (ref 3.5–5.1)

## 2011-11-08 LAB — POCT HEMOGLOBIN-HEMACUE: Hemoglobin: 9.1 g/dL — ABNORMAL LOW (ref 12.0–15.0)

## 2011-11-08 MED ORDER — EPOETIN ALFA 40000 UNIT/ML IJ SOLN
INTRAMUSCULAR | Status: AC
  Administered 2011-11-08: 40000 [IU] via SUBCUTANEOUS
  Filled 2011-11-08: qty 1

## 2011-11-08 MED ORDER — EPOETIN ALFA 10000 UNIT/ML IJ SOLN: 40000.0000 [IU] | INTRAMUSCULAR | Status: DC

## 2011-11-09 ENCOUNTER — Other Ambulatory Visit: Payer: Self-pay | Admitting: Nephrology

## 2011-11-09 DIAGNOSIS — N2581 Secondary hyperparathyroidism of renal origin: Secondary | ICD-10-CM | POA: Diagnosis not present

## 2011-11-09 DIAGNOSIS — M109 Gout, unspecified: Secondary | ICD-10-CM | POA: Diagnosis not present

## 2011-11-09 DIAGNOSIS — D649 Anemia, unspecified: Secondary | ICD-10-CM | POA: Diagnosis not present

## 2011-11-09 DIAGNOSIS — N184 Chronic kidney disease, stage 4 (severe): Secondary | ICD-10-CM | POA: Diagnosis not present

## 2011-11-12 DIAGNOSIS — M48061 Spinal stenosis, lumbar region without neurogenic claudication: Secondary | ICD-10-CM | POA: Diagnosis not present

## 2011-11-12 DIAGNOSIS — IMO0002 Reserved for concepts with insufficient information to code with codable children: Secondary | ICD-10-CM | POA: Diagnosis not present

## 2011-11-17 ENCOUNTER — Encounter (HOSPITAL_COMMUNITY): Payer: Self-pay | Admitting: Pharmacist

## 2011-11-18 ENCOUNTER — Other Ambulatory Visit (HOSPITAL_COMMUNITY): Payer: Self-pay | Admitting: *Deleted

## 2011-11-22 ENCOUNTER — Encounter (HOSPITAL_COMMUNITY): Payer: Medicare Other

## 2011-11-23 ENCOUNTER — Encounter (HOSPITAL_COMMUNITY): Payer: Medicare Other

## 2011-11-23 ENCOUNTER — Encounter: Payer: Self-pay | Admitting: Vascular Surgery

## 2011-11-24 ENCOUNTER — Ambulatory Visit (INDEPENDENT_AMBULATORY_CARE_PROVIDER_SITE_OTHER): Payer: Medicare Other | Admitting: Vascular Surgery

## 2011-11-24 ENCOUNTER — Encounter: Payer: Self-pay | Admitting: Vascular Surgery

## 2011-11-24 ENCOUNTER — Other Ambulatory Visit (INDEPENDENT_AMBULATORY_CARE_PROVIDER_SITE_OTHER): Payer: Medicare Other | Admitting: *Deleted

## 2011-11-24 VITALS — BP 127/76 | HR 88 | Resp 16 | Ht 64.0 in | Wt 273.9 lb

## 2011-11-24 DIAGNOSIS — T82898A Other specified complication of vascular prosthetic devices, implants and grafts, initial encounter: Secondary | ICD-10-CM

## 2011-11-24 DIAGNOSIS — N186 End stage renal disease: Secondary | ICD-10-CM | POA: Insufficient documentation

## 2011-11-24 NOTE — Assessment & Plan Note (Signed)
This patient had a left upper arm AV fistula placed in August of 2012. Subsequently fistulogram demonstrated that the fistula was widely patent without any significant problems noted. He arterial anastomosis was patent. There was no central venous problems. There was one small competing branches in the mid upper arm. Her is felt to the fistula still not usable and this is likely related to the depth of the fistula. Duplex scan today confirms that the fistula has a good size throughout but is deep. Also noted is an area of increased callosity in the mid arm with an adjacent competing branches. I have recommended that we attempt to superficial eyes her AV fistula especially in the upper arm where she has significant obesity. At the same time we could potentially ligate the competing branches and perhaps use a small branch to place a vein patch over the area that is narrowed on duplex if this is felt to be significant. This is scheduled for 11/30/2011.

## 2011-11-24 NOTE — Progress Notes (Signed)
[facility] [place]  Patient name: [name] MRN: [id number] DOB: 09-13-1971 Sex: female  REASON FOR VISIT: follow up of left upper arm AV fistula  HPI: [name] is a 41 y.o. female who had a left brachiocephalic AV fistula placed on 06/10/2011. On 08/02/2011 she had a fistulogram which was preformed because of a possible outflow stenosis. This demonstrated no evidence of outflow stenosis or central venous problem. The fistula itself was widely patent with no areas of significant stenosis noted. Was one small competing branches in the mid upper arm. The arterial anastomosis was widely patent.  She is not yet on dialysis. She was sent over because it is felt that the fistula is quite deep in may not be usable for access if it is needed. His had no recent uremic symptoms. Specifically she denies nausea, vomiting, fatigue, or anorexia.  Past Medical History  Diagnosis Date  . Hypertension   . Anemia   . Arthritis   . Depression   . Reflux   . Dizziness   . Anxiety   . ADD (attention deficit disorder)   . Asthma   . Headache   . Chronic kidney disease     Atrophic left kidney  . Gout   . Sleep apnea   . Anemia    REVIEW OF SYSTEMS: Valu.Nieves ] denotes positive finding; [  ] denotes negative finding  CARDIOVASCULAR:  [ ]  chest pain   [ ]  chest pressure   [ ]  palpitations   [ ]  orthopnea   [ ]  dyspnea on exertion   [ ]  claudication   [ ]  rest pain   [ ]  DVT   [ ]  phlebitis PULMONARY:   [ ]  productive cough   [ ]  asthma   [ ]  wheezing CONSTITUTIONAL:  [ ]  fever   [ ]  chills  PHYSICAL EXAM: Filed Vitals:   11/24/11 1504  BP: 127/76  Pulse: 88  Resp: 16  Height: 5\' 4"  (1.626 m)  Weight: 273 lb 14.4 oz (124.24 kg)   Body mass index is 47.01 kg/(m^2). GENERAL: The patient is a well-nourished female, in no acute distress. The vital signs are documented above. CARDIOVASCULAR: There is a regular rate and rhythm without significant murmur  appreciated.  PULMONARY: There is good air exchange bilaterally without wheezing or rales. Her left upper arm fistula has an excellent thrill in his easy to palpate proximally. It is more difficult to palpate in the mid upper arm and just becomes quite deep in the upper arm given her obesity.  DATA:  Lab Results  Component Value Date   CREATININE 5.24* 11/08/2011   I have reviewed her duplex of her fistula. This shows that the diameters of the fistula range from 7.8 mm to 16 mm over the vein is deep and in one area it is 3.9 cm in depth.  MEDICAL ISSUES: End stage renal disease This patient had a left upper arm AV fistula placed in August of 2012. Subsequently fistulogram demonstrated that the fistula was widely patent without any significant problems noted. He arterial anastomosis was patent. There was no central venous problems. There was one small competing branches in the mid upper arm. Her is felt to the fistula still not usable and this is likely related to the depth of the fistula. Duplex scan today confirms that the fistula has a good size throughout but is deep. Also noted is an area of increased callosity in the mid arm with an  adjacent competing branches. I have recommended that we attempt to superficial eyes her AV fistula especially in the upper arm where she has significant obesity. At the same time we could potentially ligate the competing branches and perhaps use a small branch to place a vein patch over the area that is narrowed on duplex if this is felt to be significant. This is scheduled for 11/30/2011.    [facility] [place]: [phone]

## 2011-11-26 ENCOUNTER — Other Ambulatory Visit: Payer: Self-pay

## 2011-11-26 ENCOUNTER — Encounter (HOSPITAL_COMMUNITY): Payer: Self-pay | Admitting: Respiratory Therapy

## 2011-11-29 ENCOUNTER — Encounter (HOSPITAL_COMMUNITY): Payer: Self-pay

## 2011-11-29 ENCOUNTER — Encounter (HOSPITAL_COMMUNITY)
Admission: RE | Admit: 2011-11-29 | Discharge: 2011-11-29 | Disposition: A | Discharge status: Home / Self Care | Payer: Medicare Other | Source: Ambulatory Visit | Attending: Nephrology | Admitting: Nephrology

## 2011-11-29 ENCOUNTER — Encounter (HOSPITAL_COMMUNITY)
Admission: RE | Admit: 2011-11-29 | Discharge: 2011-11-29 | Disposition: A | Discharge status: Home / Self Care | Payer: Medicare Other | Source: Ambulatory Visit | Attending: Vascular Surgery | Admitting: Vascular Surgery

## 2011-11-29 ENCOUNTER — Encounter (HOSPITAL_COMMUNITY)
Admission: RE | Admit: 2011-11-29 | Discharge: 2011-11-29 | Disposition: A | Discharge status: Home / Self Care | Payer: Medicare Other | Source: Ambulatory Visit | Attending: Obstetrics and Gynecology | Admitting: Obstetrics and Gynecology

## 2011-11-29 DIAGNOSIS — G4730 Sleep apnea, unspecified: Secondary | ICD-10-CM | POA: Diagnosis not present

## 2011-11-29 DIAGNOSIS — E669 Obesity, unspecified: Secondary | ICD-10-CM | POA: Diagnosis not present

## 2011-11-29 DIAGNOSIS — N185 Chronic kidney disease, stage 5: Secondary | ICD-10-CM | POA: Diagnosis not present

## 2011-11-29 DIAGNOSIS — Z01812 Encounter for preprocedural laboratory examination: Secondary | ICD-10-CM | POA: Diagnosis not present

## 2011-11-29 DIAGNOSIS — N186 End stage renal disease: Secondary | ICD-10-CM | POA: Diagnosis not present

## 2011-11-29 DIAGNOSIS — J45909 Unspecified asthma, uncomplicated: Secondary | ICD-10-CM | POA: Diagnosis not present

## 2011-11-29 DIAGNOSIS — F329 Major depressive disorder, single episode, unspecified: Secondary | ICD-10-CM | POA: Diagnosis not present

## 2011-11-29 DIAGNOSIS — I120 Hypertensive chronic kidney disease with stage 5 chronic kidney disease or end stage renal disease: Secondary | ICD-10-CM | POA: Diagnosis not present

## 2011-11-29 DIAGNOSIS — T82898A Other specified complication of vascular prosthetic devices, implants and grafts, initial encounter: Secondary | ICD-10-CM | POA: Diagnosis not present

## 2011-11-29 DIAGNOSIS — M109 Gout, unspecified: Secondary | ICD-10-CM | POA: Diagnosis not present

## 2011-11-29 DIAGNOSIS — F988 Other specified behavioral and emotional disorders with onset usually occurring in childhood and adolescence: Secondary | ICD-10-CM | POA: Diagnosis not present

## 2011-11-29 DIAGNOSIS — D649 Anemia, unspecified: Secondary | ICD-10-CM | POA: Diagnosis not present

## 2011-11-29 DIAGNOSIS — K219 Gastro-esophageal reflux disease without esophagitis: Secondary | ICD-10-CM | POA: Diagnosis not present

## 2011-11-29 HISTORY — DX: Shortness of breath: R06.02

## 2011-11-29 HISTORY — DX: Obesity, unspecified: E66.9

## 2011-11-29 HISTORY — DX: Gastro-esophageal reflux disease without esophagitis: K21.9

## 2011-11-29 HISTORY — DX: Gastritis, unspecified, without bleeding: K29.70

## 2011-11-29 HISTORY — DX: Encounter for other specified aftercare: Z51.89

## 2011-11-29 HISTORY — DX: Bronchitis, not specified as acute or chronic: J40

## 2011-11-29 HISTORY — DX: Myoneural disorder, unspecified: G70.9

## 2011-11-29 LAB — COMPREHENSIVE METABOLIC PANEL
AST: 20 U/L (ref 0–37)
CO2: 22 mEq/L (ref 19–32)
Chloride: 99 mEq/L (ref 96–112)
Creatinine, Ser: 5.82 mg/dL — ABNORMAL HIGH (ref 0.50–1.10)
GFR calc non Af Amer: 8 mL/min — ABNORMAL LOW (ref >90)
Total Bilirubin: 0.3 mg/dL (ref 0.3–1.2)

## 2011-11-29 LAB — CBC
HCT: 29.5 % — ABNORMAL LOW (ref 36.0–46.0)
MCV: 89.1 fL (ref 78.0–100.0)
Platelets: 279 10*3/uL (ref 150–400)
RBC: 3.31 MIL/uL — ABNORMAL LOW (ref 3.87–5.11)
WBC: 12.3 10*3/uL — ABNORMAL HIGH (ref 4.0–10.5)

## 2011-11-29 LAB — SURGICAL PCR SCREEN: MRSA, PCR: NEGATIVE

## 2011-11-29 MED ORDER — EPOETIN ALFA 10000 UNIT/ML IJ SOLN: 40000.0000 [IU] | INTRAMUSCULAR | Status: DC

## 2011-11-29 MED ORDER — SODIUM CHLORIDE 0.9 % IV SOLN: INTRAVENOUS | Status: DC

## 2011-11-29 MED ORDER — EPOETIN ALFA 40000 UNIT/ML IJ SOLN
INTRAMUSCULAR | Status: AC
  Administered 2011-11-29: 40000 [IU] via SUBCUTANEOUS
  Filled 2011-11-29: qty 1

## 2011-11-29 MED ORDER — SODIUM CHLORIDE 0.9 % IV SOLN
INTRAVENOUS | Status: DC
  Administered 2011-11-30: 10:00:00 via INTRAVENOUS

## 2011-11-29 MED ORDER — CEFAZOLIN SODIUM-DEXTROSE 2-3 GM-% IV SOLR
2.0000 g | INTRAVENOUS | Status: AC
  Administered 2011-11-30: 2 g via INTRAVENOUS
  Filled 2011-11-29: qty 50

## 2011-11-29 NOTE — Pre-Procedure Instructions (Signed)
[facility]  [id number]   Your procedure is scheduled on:  Tuesday November 30, 2011  Report to [facility] at 0730 AM.  Call this number if you have problems the morning of surgery: [phone]   Remember:   Do not eat food:After Midnight.  May have clear liquids: up to 4 Hours before arrival. (up to 3:30am)  Clear liquids include soda, tea, black coffee, apple or grape juice, broth.  Take these medicines the morning of surgery with A SIP OF WATER: albuterol, allopurinol, xanax,  Amlodipine, atenolol, celexa, nexium, advair, oxycodone,    Do not wear jewelry, make-up or nail polish.  Do not wear lotions, powders, or perfumes. You may wear deodorant.  Do not shave 48 hours prior to surgery.  Do not bring valuables to the hospital.  Contacts, dentures or bridgework may not be worn into surgery.  Leave suitcase in the car. After surgery it may be brought to your room.  For patients admitted to the hospital, checkout time is 11:00 AM the day of discharge.   Patients discharged the day of surgery will not be allowed to drive home.  Name and phone number of your driver: Olene Craven [phone]  Special Instructions: CHG Shower Use Special Wash: 1/2 bottle night before surgery and 1/2 bottle morning of surgery.   Please read over the following fact sheets that you were given: Pain Booklet, Coughing and Deep Breathing, MRSA Information and Surgical Site Infection Prevention

## 2011-11-29 NOTE — Consult Note (Signed)
Anesthesia:  Patient for revision of her LUE AVF on 11/30/11.  She is not yet on HD.  History includes anemia, CKD, dizziness, anxiety, headaches, asthma, GERD, obesity, OSA, HTN, and smoking.   Labs reviewed.  For ISTAT on arrival tomorrow.    CXR from 10/18/11 showed: Cardiomegaly. Mild vascular congestion. No overt infiltrates or failure.  EKG from 10/18/11 noted showing SR with first degree AVG, cannot rule out anterior infarct (age undetermined).  Clinical correlation on the day of surgery.

## 2011-11-29 NOTE — Pre-Procedure Instructions (Signed)
Dr Rudean Curt saw this patient at pre-op appt.  Instructions given regarding meds to take DOS.  No IV/Blood Draws in LEFT ARM, Restricted extremity braclet placed on chart.  Blood bank called regarding patient's history of blood transfusion in 06/2011 at [facility], spoke with Jacinto Halim, Lab Tech at [facility]  Dr Rudean Curt ordered a CMP stat DOS (incomputer) and to used NS DOS (in computer).

## 2011-11-29 NOTE — Progress Notes (Signed)
[facility]  Had blood work done.  No need for repeat at [facility].

## 2011-11-29 NOTE — Anesthesia Preprocedure Evaluation (Addendum)
Anesthesia Evaluation  Patient identified by MRN, date of birth, ID band Patient awake    Reviewed: Allergy & Precautions, H&P , Patient's Chart, lab work & pertinent test results, reviewed documented beta blocker date and time   History of Anesthesia Complications Negative for: history of anesthetic complications  Airway Mallampati: III TM Distance: >3 FB Neck ROM: full    Dental No notable dental hx.    Pulmonary neg pulmonary ROS, shortness of breath, asthma , sleep apnea ,  clear to auscultation  Pulmonary exam normal       Cardiovascular Exercise Tolerance: Poor hypertension, neg cardio ROS regular Normal    Neuro/Psych  Headaches, PSYCHIATRIC DISORDERS  Neuromuscular disease Negative Neurological ROS  Negative Psych ROS   GI/Hepatic negative GI ROS, Neg liver ROS, GERD-  Controlled,  Endo/Other  Negative Endocrine ROS  Renal/GU CRFnegative Renal ROS     Musculoskeletal   Abdominal   Peds  Hematology negative hematology ROS (+)   Anesthesia Other Findings Refuses spinal Focal segmental glomerulosclerosis- baseline Cr~5 Fistula in L arm- avoid BP there Consider post op dialysis- if post op CMET is abberant Normal saline for IVF Phenylephrine GTT prepared preop- avoid hypotension Avoid all NSAIDS Having fistula repair on 1/29 at [facility]     Anemia        Depression     Reflux        Dizziness     Anxiety        ADD (attention deficit disorder)     Asthma        Headache        Atrophic left kidney    Gout     Anemia        GERD (gastroesophageal reflux disease)     Obesity        Gastritis     Sleep apnea   does not use CPAP    Blood transfusion 06/2011 6 units transfused at [place]   knee, wrist, back     Reproductive/Obstetrics negative OB ROS                         Anesthesia Physical Anesthesia Plan  ASA: III  Anesthesia Plan: General LMA   Post-op Pain  Management:    Induction:   Airway Management Planned:   Additional Equipment:   Intra-op Plan:   Post-operative Plan:   Informed Consent: I have reviewed the patients History and Physical, chart, labs and discussed the procedure including the risks, benefits and alternatives for the proposed anesthesia with the patient or authorized representative who has indicated his/her understanding and acceptance.   Dental Advisory Given  Plan Discussed with: CRNA, Surgeon and Anesthesiologist  Anesthesia Plan Comments:         Anesthesia Quick Evaluation

## 2011-11-29 NOTE — Patient Instructions (Addendum)
   Your procedure is scheduled on: Friday, Feb. 8th  Enter through the Main Entrance of [facility] at: 7:45am Pick up the phone at the desk and dial [phone] and inform us of your arrival.  Please call this number if you have any problems the morning of surgery: [phone]  Remember: Do not eat food after midnight: Thursday Do not drink clear liquids after: Thursday Take these medicines the morning of surgery with a SIP OF WATER: per anesthesia  Do not wear jewelry, make-up, or FINGER nail polish Do not wear lotions, powders, perfumes or deodorant. Do not shave 48 hours prior to surgery. Do not bring valuables to the hospital.  Patients discharged on the day of surgery will not be allowed to drive home.   Home with daughter Herbie Saxon cell [phone].   Remember to use your hibiclens as instructed.Please shower with 1/2 bottle the evening before your surgery and the other 1/2 bottle the morning of surgery.

## 2011-11-30 ENCOUNTER — Encounter (HOSPITAL_COMMUNITY): Payer: Self-pay | Admitting: Vascular Surgery

## 2011-11-30 ENCOUNTER — Ambulatory Visit (HOSPITAL_COMMUNITY): Payer: Medicare Other | Admitting: Vascular Surgery

## 2011-11-30 ENCOUNTER — Ambulatory Visit (HOSPITAL_COMMUNITY)
Admission: RE | Admit: 2011-11-30 | Discharge: 2011-11-30 | Disposition: A | Discharge status: Home / Self Care | Payer: Medicare Other | Source: Ambulatory Visit | Attending: Vascular Surgery | Admitting: Vascular Surgery

## 2011-11-30 ENCOUNTER — Encounter (HOSPITAL_COMMUNITY)
Admission: RE | Disposition: A | Discharge status: Home / Self Care | Payer: Self-pay | Source: Ambulatory Visit | Attending: Vascular Surgery

## 2011-11-30 DIAGNOSIS — F329 Major depressive disorder, single episode, unspecified: Secondary | ICD-10-CM | POA: Insufficient documentation

## 2011-11-30 DIAGNOSIS — I120 Hypertensive chronic kidney disease with stage 5 chronic kidney disease or end stage renal disease: Secondary | ICD-10-CM | POA: Diagnosis not present

## 2011-11-30 DIAGNOSIS — N186 End stage renal disease: Secondary | ICD-10-CM | POA: Diagnosis not present

## 2011-11-30 DIAGNOSIS — F3289 Other specified depressive episodes: Secondary | ICD-10-CM | POA: Insufficient documentation

## 2011-11-30 DIAGNOSIS — K219 Gastro-esophageal reflux disease without esophagitis: Secondary | ICD-10-CM | POA: Insufficient documentation

## 2011-11-30 DIAGNOSIS — N185 Chronic kidney disease, stage 5: Secondary | ICD-10-CM | POA: Diagnosis not present

## 2011-11-30 DIAGNOSIS — G4730 Sleep apnea, unspecified: Secondary | ICD-10-CM | POA: Insufficient documentation

## 2011-11-30 DIAGNOSIS — Y832 Surgical operation with anastomosis, bypass or graft as the cause of abnormal reaction of the patient, or of later complication, without mention of misadventure at the time of the procedure: Secondary | ICD-10-CM | POA: Insufficient documentation

## 2011-11-30 DIAGNOSIS — D649 Anemia, unspecified: Secondary | ICD-10-CM | POA: Diagnosis not present

## 2011-11-30 DIAGNOSIS — F988 Other specified behavioral and emotional disorders with onset usually occurring in childhood and adolescence: Secondary | ICD-10-CM | POA: Insufficient documentation

## 2011-11-30 DIAGNOSIS — J45909 Unspecified asthma, uncomplicated: Secondary | ICD-10-CM | POA: Insufficient documentation

## 2011-11-30 DIAGNOSIS — T82898A Other specified complication of vascular prosthetic devices, implants and grafts, initial encounter: Secondary | ICD-10-CM

## 2011-11-30 DIAGNOSIS — E669 Obesity, unspecified: Secondary | ICD-10-CM | POA: Insufficient documentation

## 2011-11-30 DIAGNOSIS — Z01812 Encounter for preprocedural laboratory examination: Secondary | ICD-10-CM | POA: Insufficient documentation

## 2011-11-30 DIAGNOSIS — M109 Gout, unspecified: Secondary | ICD-10-CM | POA: Insufficient documentation

## 2011-11-30 HISTORY — PX: OTHER SURGICAL HISTORY: SHX169

## 2011-11-30 LAB — BASIC METABOLIC PANEL
CO2: 20 mEq/L (ref 19–32)
Calcium: 9.9 mg/dL (ref 8.4–10.5)
Glucose, Bld: 152 mg/dL — ABNORMAL HIGH (ref 70–99)
Potassium: 3.6 mEq/L (ref 3.5–5.1)
Sodium: 140 mEq/L (ref 135–145)

## 2011-11-30 LAB — POCT I-STAT 4, (NA,K, GLUC, HGB,HCT)
Hemoglobin: 9.2 g/dL — ABNORMAL LOW (ref 12.0–15.0)
Potassium: 3.7 mEq/L (ref 3.5–5.1)

## 2011-11-30 LAB — CBC
HCT: 27.6 % — ABNORMAL LOW (ref 36.0–46.0)
Hemoglobin: 8.8 g/dL — ABNORMAL LOW (ref 12.0–15.0)
MCH: 28 pg (ref 26.0–34.0)
MCHC: 31.9 g/dL (ref 30.0–36.0)
MCV: 87.9 fL (ref 78.0–100.0)

## 2011-11-30 SURGERY — REVISON OF ARTERIOVENOUS FISTULA
Anesthesia: Monitor Anesthesia Care | Site: Arm Lower | Laterality: Left | Wound class: Clean

## 2011-11-30 MED ORDER — FENTANYL CITRATE 0.05 MG/ML IJ SOLN
INTRAMUSCULAR | Status: DC | PRN
  Administered 2011-11-30: 150 ug via INTRAVENOUS
  Administered 2011-11-30: 25 ug via INTRAVENOUS
  Administered 2011-11-30: 50 ug via INTRAVENOUS
  Administered 2011-11-30: 25 ug via INTRAVENOUS
  Administered 2011-11-30 (×2): 50 ug via INTRAVENOUS

## 2011-11-30 MED ORDER — OXYCODONE HCL 5 MG PO TABS: 5.0000 mg | ORAL_TABLET | Freq: Four times a day (QID) | ORAL | Status: AC | PRN

## 2011-11-30 MED ORDER — PHENYLEPHRINE HCL 10 MG/ML IJ SOLN
INTRAMUSCULAR | Status: DC | PRN
  Administered 2011-11-30: 80 ug via INTRAVENOUS

## 2011-11-30 MED ORDER — ONDANSETRON HCL 4 MG/2ML IJ SOLN
INTRAMUSCULAR | Status: DC | PRN
  Administered 2011-11-30: 4 mg via INTRAVENOUS

## 2011-11-30 MED ORDER — 0.9 % SODIUM CHLORIDE (POUR BTL) OPTIME
TOPICAL | Status: DC | PRN
  Administered 2011-11-30: 1000 mL

## 2011-11-30 MED ORDER — DEXTROSE 5 % IV SOLN
INTRAVENOUS | Status: DC | PRN
  Administered 2011-11-30: 11:00:00 via INTRAVENOUS

## 2011-11-30 MED ORDER — PROPOFOL 10 MG/ML IV EMUL
INTRAVENOUS | Status: DC | PRN
  Administered 2011-11-30: 200 mL via INTRAVENOUS

## 2011-11-30 MED ORDER — HYDROMORPHONE HCL PF 1 MG/ML IJ SOLN
0.2500 mg | INTRAMUSCULAR | Status: DC | PRN
  Administered 2011-11-30 (×5): 0.5 mg via INTRAVENOUS

## 2011-11-30 MED ORDER — DROPERIDOL 2.5 MG/ML IJ SOLN: 0.6250 mg | INTRAMUSCULAR | Status: DC | PRN

## 2011-11-30 MED ORDER — SODIUM CHLORIDE 0.9 % IR SOLN
Status: DC | PRN
  Administered 2011-11-30: 11:00:00

## 2011-11-30 MED ORDER — MIDAZOLAM HCL 5 MG/5ML IJ SOLN
INTRAMUSCULAR | Status: DC | PRN
  Administered 2011-11-30: 2 mg via INTRAVENOUS

## 2011-11-30 SURGICAL SUPPLY — 38 items
CANISTER SUCTION 2500CC (MISCELLANEOUS) ×2 IMPLANT
CLIP TI MEDIUM 6 (CLIP) ×2 IMPLANT
CLIP TI WIDE RED SMALL 24 (CLIP) ×2 IMPLANT
CLIP TI WIDE RED SMALL 6 (CLIP) ×2 IMPLANT
CLOTH BEACON ORANGE TIMEOUT ST (SAFETY) ×2 IMPLANT
COVER PROBE W GEL 5X96 (DRAPES) ×2 IMPLANT
COVER SURGICAL LIGHT HANDLE (MISCELLANEOUS) ×4 IMPLANT
DECANTER SPIKE VIAL GLASS SM (MISCELLANEOUS) ×2 IMPLANT
DERMABOND ADVANCED (GAUZE/BANDAGES/DRESSINGS) ×1
DERMABOND ADVANCED .7 DNX12 (GAUZE/BANDAGES/DRESSINGS) ×1 IMPLANT
DRAIN PENROSE 1/2X12 LTX STRL (WOUND CARE) IMPLANT
ELECT REM PT RETURN 9FT ADLT (ELECTROSURGICAL) ×2
ELECTRODE REM PT RTRN 9FT ADLT (ELECTROSURGICAL) ×1 IMPLANT
GLOVE BIO SURGEON STRL SZ7.5 (GLOVE) ×2 IMPLANT
GLOVE BIOGEL PI IND STRL 7.0 (GLOVE) ×2 IMPLANT
GLOVE BIOGEL PI IND STRL 7.5 (GLOVE) ×3 IMPLANT
GLOVE BIOGEL PI INDICATOR 7.0 (GLOVE) ×2
GLOVE BIOGEL PI INDICATOR 7.5 (GLOVE) ×3
GLOVE ECLIPSE 7.0 STRL STRAW (GLOVE) ×2 IMPLANT
GLOVE SURG SS PI 7.5 STRL IVOR (GLOVE) ×2 IMPLANT
GOWN STRL NON-REIN LRG LVL3 (GOWN DISPOSABLE) ×4 IMPLANT
KIT BASIN OR (CUSTOM PROCEDURE TRAY) ×2 IMPLANT
KIT ROOM TURNOVER OR (KITS) ×2 IMPLANT
NS IRRIG 1000ML POUR BTL (IV SOLUTION) ×2 IMPLANT
PACK CV ACCESS (CUSTOM PROCEDURE TRAY) ×2 IMPLANT
PAD ARMBOARD 7.5X6 YLW CONV (MISCELLANEOUS) ×4 IMPLANT
SPONGE GAUZE 4X4 12PLY (GAUZE/BANDAGES/DRESSINGS) ×2 IMPLANT
SPONGE SURGIFOAM ABS GEL 100 (HEMOSTASIS) IMPLANT
SUT PROLENE 6 0 BV (SUTURE) ×2 IMPLANT
SUT SILK 3 0 (SUTURE) ×1
SUT SILK 3-0 18XBRD TIE 12 (SUTURE) ×1 IMPLANT
SUT VIC AB 3-0 SH 27 (SUTURE) ×6
SUT VIC AB 3-0 SH 27X BRD (SUTURE) ×6 IMPLANT
SUT VICRYL 4-0 PS2 18IN ABS (SUTURE) ×4 IMPLANT
TOWEL OR 17X24 6PK STRL BLUE (TOWEL DISPOSABLE) ×2 IMPLANT
TOWEL OR 17X26 10 PK STRL BLUE (TOWEL DISPOSABLE) ×2 IMPLANT
UNDERPAD 30X30 INCONTINENT (UNDERPADS AND DIAPERS) ×2 IMPLANT
WATER STERILE IRR 1000ML POUR (IV SOLUTION) ×2 IMPLANT

## 2011-11-30 NOTE — Anesthesia Preprocedure Evaluation (Addendum)
Anesthesia Evaluation  Patient identified by MRN, date of birth, ID band Patient awake    Reviewed: Allergy & Precautions, H&P , NPO status , Patient's Chart, lab work & pertinent test results, reviewed documented beta blocker date and time   History of Anesthesia Complications Negative for: history of anesthetic complications  Airway Mallampati: III TM Distance: >3 FB Neck ROM: Full    Dental  (+) Teeth Intact, Chipped and Dental Advisory Given   Pulmonary shortness of breath and with exertion, asthma , sleep apnea ,  clear to auscultation  Pulmonary exam normal       Cardiovascular hypertension, Pt. on medications and Pt. on home beta blockers Regular Normal- Systolic murmurs and - Carotid Bruit    Neuro/Psych  Headaches, PSYCHIATRIC DISORDERS Anxiety Depression  Neuromuscular disease    GI/Hepatic Neg liver ROS, GERD-  Controlled,  Endo/Other  Morbid obesity  Renal/GU Renal InsufficiencyRenal disease     Musculoskeletal   Abdominal   Peds  Hematology   Anesthesia Other Findings   Reproductive/Obstetrics                          Anesthesia Physical Anesthesia Plan  ASA: III  Anesthesia Plan: MAC and General   Post-op Pain Management:    Induction: Intravenous  Airway Management Planned: LMA  Additional Equipment:   Intra-op Plan:   Post-operative Plan: Extubation in OR  Informed Consent: I have reviewed the patients History and Physical, chart, labs and discussed the procedure including the risks, benefits and alternatives for the proposed anesthesia with the patient or authorized representative who has indicated his/her understanding and acceptance.   Dental advisory given  Plan Discussed with: CRNA, Anesthesiologist and Surgeon  Anesthesia Plan Comments:        Anesthesia Quick Evaluation

## 2011-11-30 NOTE — Anesthesia Procedure Notes (Signed)
Procedure Name: LMA Insertion Date/Time: 11/30/2011 10:50 AM Performed by: Wanita Chamberlain Pre-anesthesia Checklist: Patient identified and Emergency Drugs available Patient Re-evaluated:Patient Re-evaluated prior to inductionOxygen Delivery Method: Circle System Utilized Preoxygenation: Pre-oxygenation with 100% oxygen Intubation Type: IV induction Ventilation: Mask ventilation with difficulty LMA: LMA inserted LMA Size: 4.0 Number of attempts: 1 Dental Injury: Teeth and Oropharynx as per pre-operative assessment

## 2011-11-30 NOTE — Interval H&P Note (Signed)
History and Physical Interval Note:  11/30/2011 10:39 AM  [name]  has presented today for surgery, with the diagnosis of End Stage Renal Disease   The various methods of treatment have been discussed with the patient and family. After consideration of risks, benefits and other options for treatment, the patient has consented to: [place].  The patients' history has been reviewed, patient examined, no change in status, stable for surgery.  I have reviewed the patients' chart and labs.  Questions were answered to the patient's satisfaction.     DICKSON,CHRISTOPHER S

## 2011-11-30 NOTE — H&P (View-Only) (Signed)
[facility] [place]  Patient name: [name] MRN: [id number] DOB: [birth year] Sex: female  REASON FOR VISIT: follow up of left upper arm AV fistula  HPI: [name] is a 41 y.o. female who had a left brachiocephalic AV fistula placed on 06/10/2011. On 08/02/2011 she had a fistulogram which was preformed because of a possible outflow stenosis. This demonstrated no evidence of outflow stenosis or central venous problem. The fistula itself was widely patent with no areas of significant stenosis noted. Was one small competing branches in the mid upper arm. The arterial anastomosis was widely patent.  She is not yet on dialysis. She was sent over because it is felt that the fistula is quite deep in may not be usable for access if it is needed. His had no recent uremic symptoms. Specifically she denies nausea, vomiting, fatigue, or anorexia.  Past Medical History  Diagnosis Date  . Hypertension   . Anemia   . Arthritis   . Depression   . Reflux   . Dizziness   . Anxiety   . ADD (attention deficit disorder)   . Asthma   . Headache   . Chronic kidney disease     Atrophic left kidney  . Gout   . Sleep apnea   . Anemia    REVIEW OF SYSTEMS: Valu.Nieves ] denotes positive finding; [  ] denotes negative finding  CARDIOVASCULAR:  [ ]  chest pain   [ ]  chest pressure   [ ]  palpitations   [ ]  orthopnea   [ ]  dyspnea on exertion   [ ]  claudication   [ ]  rest pain   [ ]  DVT   [ ]  phlebitis PULMONARY:   [ ]  productive cough   [ ]  asthma   [ ]  wheezing CONSTITUTIONAL:  [ ]  fever   [ ]  chills  PHYSICAL EXAM: Filed Vitals:   11/24/11 1504  BP: 127/76  Pulse: 88  Resp: 16  Height: 5\' 4"  (1.626 m)  Weight: 273 lb 14.4 oz (124.24 kg)   Body mass index is 47.01 kg/(m^2). GENERAL: The patient is a well-nourished female, in no acute distress. The vital signs are documented above. CARDIOVASCULAR: There is a regular rate and rhythm without significant murmur  appreciated.  PULMONARY: There is good air exchange bilaterally without wheezing or rales. Her left upper arm fistula has an excellent thrill in his easy to palpate proximally. It is more difficult to palpate in the mid upper arm and just becomes quite deep in the upper arm given her obesity.  DATA:  Lab Results  Component Value Date   CREATININE 5.24* 11/08/2011   I have reviewed her duplex of her fistula. This shows that the diameters of the fistula range from 7.8 mm to 16 mm over the vein is deep and in one area it is 3.9 cm in depth.  MEDICAL ISSUES: End stage renal disease This patient had a left upper arm AV fistula placed in August of 2012. Subsequently fistulogram demonstrated that the fistula was widely patent without any significant problems noted. He arterial anastomosis was patent. There was no central venous problems. There was one small competing branches in the mid upper arm. Her is felt to the fistula still not usable and this is likely related to the depth of the fistula. Duplex scan today confirms that the fistula has a good size throughout but is deep. Also noted is an area of increased callosity in the mid arm with an  adjacent competing branches. I have recommended that we attempt to superficial eyes her AV fistula especially in the upper arm where she has significant obesity. At the same time we could potentially ligate the competing branches and perhaps use a small branch to place a vein patch over the area that is narrowed on duplex if this is felt to be significant. This is scheduled for 11/30/2011.    [facility] [place] Beeper: [phone]

## 2011-11-30 NOTE — Transfer of Care (Signed)
Immediate Anesthesia Transfer of Care Note  Patient: [name]  Procedure(s) Performed:  REVISON OF ARTERIOVENOUS FISTULA - Superficialization of Left Arm Arteiovenous Fistula, ligation of competing branches   Patient Location: PACU  Anesthesia Type: General  Level of Consciousness: awake, alert  and oriented  Airway & Oxygen Therapy: Patient Spontanous Breathing and Patient connected to nasal cannula oxygen  Post-op Assessment: Report given to PACU RN and Post -op Vital signs reviewed and stable  Post vital signs: Reviewed and stable  Complications: No apparent anesthesia complications

## 2011-11-30 NOTE — Op Note (Signed)
NAME: [name]   MRN: [id number] DOB: 1971-09-05    DATE OF OPERATION: 11/30/2011  PREOP DIAGNOSIS:  Status post left brachiocephalic fistula with fistula not usable because of depth  POSTOP DIAGNOSIS: same  PROCEDURE:  1. Superficialiazation of left brachiocephalic AV fistula 2. Ligation of competing branch of AV fistula  SURGEON: Judeth Cornfield. Scot Dock, MD, FACS  ASSIST: Gerri Lins PA  ANESTHESIA: Gen.   EBL: minimal  INDICATIONS: [name] is a 41 y.o. female who had a previous left brachiocephalic AV fistula. This is quite deep and not usable because of the depth. She's not yet on dialysis.  FINDINGS: the vein was very deep but to 6 cm in the upper arm.  TECHNIQUE: patient was taken to the operating room and received a general anesthetic. The left upper extremity was prepped and draped in the usual sterile fashion. Using ultrasound scanner I did visualize the area of the competing branch which had been identified by ultrasound. Incision at this level was marked and incisions proximal and distal this were marked. The very proximal fistula was very close to the skin and did not need to be superficialized. Through these 3 incisions the cephalic vein was fully mobilized from just above the antecubital level up to the upper chest.  Through the middle incision the large competing branch was identified and ligated with 2 3-0 silk ties. I then created a pocket lateral to this incision allowing the vein to the superficialized. By undermining the skin laterally it was able to sew the vein into this pocket which I created. Hemostasis was obtained in the wounds the wounds were then closed the deep layer of 3-0 Vicryl and the skin closed with 4-0 Vicryl. Dermabond was applied. Patient tolerated procedure well and was transferred to the recovery room in stable condition. All needle and sponge counts were correct.  Deitra Mayo, MD, FACS [facility]  DATE OF DICTATION:   11/30/2011

## 2011-11-30 NOTE — Preoperative (Signed)
Beta Blockers  Took Atenolol @ 6:15 this am

## 2011-11-30 NOTE — Anesthesia Postprocedure Evaluation (Signed)
Anesthesia Post Note  Patient: [name]  Procedure(s) Performed:  REVISON OF ARTERIOVENOUS FISTULA - Superficialization of Left Arm Arteiovenous Fistula, ligation of competing branches   Anesthesia type: General  Patient location: PACU  Post pain: Pain level controlled and Adequate analgesia  Post assessment: Post-op Vital signs reviewed, Patient's Cardiovascular Status Stable, Respiratory Function Stable, Patent Airway and Pain level controlled  Last Vitals:  Filed Vitals:   11/30/11 0746  BP: 94/62  Pulse: 60  Temp: 36.8 C  Resp: 18    Post vital signs: Reviewed and stable  Level of consciousness: awake, alert  and oriented  Complications: No apparent anesthesia complications

## 2011-12-01 ENCOUNTER — Other Ambulatory Visit: Payer: Self-pay | Admitting: *Deleted

## 2011-12-01 DIAGNOSIS — T82898A Other specified complication of vascular prosthetic devices, implants and grafts, initial encounter: Secondary | ICD-10-CM

## 2011-12-01 DIAGNOSIS — N186 End stage renal disease: Secondary | ICD-10-CM

## 2011-12-01 NOTE — Procedures (Unsigned)
VASCULAR LAB EXAM  INDICATION:  Left upper extremity AV fistula placed August 2012.  HISTORY: End-stage renal disease. Diabetes: Cardiac: Hypertension:  Yes.  EXAM:  Patent left brachiocephalic fistula with an area of narrowing in the mid upper arm.  Elevated velocities are observed here as well as what may be competing branches with increased flow.  Depth of the fistula in the upper arm ranges from 1.04 cm to 3.9 cm.  IMPRESSION:  Patent left brachiocephalic fistula that appears deep in the upper arm with elevated velocities in a large branch in the mid upper arm.  ___________________________________________ Judeth Cornfield. Scot Dock, M.D.  LT/MEDQ  D:  11/25/2011  T:  11/25/2011  Job:  [id number]

## 2011-12-08 ENCOUNTER — Other Ambulatory Visit: Payer: Self-pay | Admitting: Obstetrics and Gynecology

## 2011-12-09 NOTE — H&P (Signed)
[name] y.o. female. Who presents with heavy and irreg vaginal bleeding for three months .  She has uses 1 pads/ tampons every hour while menstruating.  She denies any CP or SOB.  Nothing makes it better.  Nothing makes it worse.  Positive dysmenorrhea.  Pt has tried Mirena and provera without success. Pertinent Gynecological History: Contraception: Education given regarding options for contraception, including none. Blood transfusions: 8/12 Sexually transmitted diseases: none Previous GYN Procedures:none Last mammogram:  Last pap: normal Date: 12/12 OB History: G2P1  Menstrual History: Menarche age:18 No LMP recorded.    Past Medical History  Diagnosis Date  . Anemia   . Depression   . Reflux   . Dizziness   . Anxiety   . ADD (attention deficit disorder)   . Asthma   . Headache   . Gout   . Anemia   . GERD (gastroesophageal reflux disease)   . Obesity   . Gastritis   . Blood transfusion 06/2011    6 units transfused at [facility]  . Arthritis     knee, wrist, back   . Bronchitis   . Shortness of breath   . Sleep apnea     does not use CPAP  . Neuromuscular disorder     carpal tunnel  . Chronic kidney disease     Atrophic left kidney, sees Dr. Hassell Done  . Hypertension     sees Dr. Wendie Agreste   Past Surgical History  Procedure Date  . Cesarean section   . Av fistula placement 06/10/11    Left brachiocephalic AVF  . Spine surgery 2004    Lumbar diskectomy   . Microdiskectomy 04/2001    Herniated disk L5-S1  . Renal biopsy 04/2004  . Wisdom tooth extraction   . Carpel tunnel release     x 2; bilateral  . Upper gastrointestinal endoscopy   . Colonscopy   . Dilation and curettage of uterus    No current facility-administered medications for this encounter. Current outpatient prescriptions:acetaminophen (TYLENOL) 500 MG tablet, Take 1,000 mg by mouth daily as needed. For headache, Disp: , Rfl: ;  albuterol (PROVENTIL HFA;VENTOLIN HFA) 108 (90 BASE)  MCG/ACT inhaler, Inhale 2 puffs into the lungs every 6 (six) hours as needed. For shortness of breath , Disp: , Rfl: ;  allopurinol (ZYLOPRIM) 300 MG tablet, Take 300 mg by mouth daily.  , Disp: , Rfl:  ALPRAZolam (XANAX) 0.5 MG tablet, Take 0.5 mg by mouth 3 (three) times daily. , Disp: , Rfl: ;  amLODipine (NORVASC) 5 MG tablet, Take 5 mg by mouth daily.  , Disp: , Rfl: ;  atenolol (TENORMIN) 100 MG tablet, Take 200 mg by mouth daily. , Disp: , Rfl: ;  calcitRIOL (ROCALTROL) 0.25 MCG capsule, Take 0.25 mcg by mouth every Monday, Wednesday, and Friday. , Disp: , Rfl:  calcium acetate (PHOSLO) 667 MG capsule, Take 667 mg by mouth 3 (three) times daily with meals., Disp: , Rfl: ;  citalopram (CELEXA) 20 MG tablet, Take 40 mg by mouth daily. , Disp: , Rfl: ;  doxazosin (CARDURA) 8 MG tablet, Take 16 mg by mouth at bedtime. , Disp: , Rfl: ;  epoetin alfa (EPOGEN,PROCRIT) 96295 UNIT/ML injection, Inject 40,000 Units into the skin every 14 (fourteen) days. Last dose 1/7, Disp: , Rfl:  esomeprazole (NEXIUM) 40 MG capsule, Take 40 mg by mouth daily. , Disp: , Rfl: ;  Fluticasone-Salmeterol (ADVAIR) 250-50 MCG/DOSE AEPB, Inhale 1 puff into the lungs every 12 (twelve)  hours.  , Disp: , Rfl: ;  furosemide (LASIX) 80 MG tablet, Take 80-160 mg by mouth 2 (two) times daily. Takes 160mg  every morning and 80mg  every afternoon, Disp: , Rfl: ;  montelukast (SINGULAIR) 10 MG tablet, Take 10 mg by mouth every morning. , Disp: , Rfl:  norethindrone (AYGESTIN) 5 MG tablet, Take 10 mg by mouth daily. , Disp: , Rfl: ;  oxyCODONE (OXYCONTIN) 10 MG 12 hr tablet, Take 10 mg by mouth every 12 (twelve) hours.  , Disp: , Rfl: ;  oxyCODONE (ROXICODONE) 5 MG immediate release tablet, Take 1 tablet (5 mg total) by mouth every 6 (six) hours as needed for pain (break through pain)., Disp: 30 tablet, Rfl: 0 promethazine (PHENERGAN) 25 MG tablet, Take 25 mg by mouth every 6 (six) hours as needed. For nausea, Disp: , Rfl:  Allergies  Allergen  Reactions  . Biaxin Nausea And Vomiting  . Demerol Nausea And Vomiting    Severe headaches  . Morphine And Related Nausea And Vomiting    Severe headache  . Nsaids     Renal failure  . Other     Seeds - has diverticulitis    Review of Systems - Negative except irreg vaginal bleeding, chronic kidney disease and chronic fatigue General ROS: see above ENT ROS: negative Respiratory ROS: no cough, shortness of breath, or wheezing Cardiovascular ROS: no chest pain or dyspnea on exertion Gastrointestinal ROS: no abdominal pain, change in bowel habits, or black or bloody stools Musculoskeletal ROS: negative   Physical Exam  There were no vitals taken for this visit. Constitutional: She appears well-developed and well-nourished.  HENT:  Head: Normocephalic.  Eyes: Pupils are equal, round, and reactive to light.  Neck: Normal range of motion. Neck supple.  Cardiovascular: Regular rhythm.   Respiratory: Effort normal and breath sounds normal.  GI: Soft.  Genitourinary:normal appearing vulva and vagina.  Uterus normal size, non tender and no adnexal tenderness B Musculoskeletal: Normal range of motion.  Neurological: She is alert.  Skin: Skin is warm.  Psychiatric: She has a normal mood and affect.  No results found for this or any previous visit (from the past 72 hour(s)). [place] widt8.2*  Length2.8 OvarieWNLB  Assessment/Plan: Pt was seen by her nephrologist who felt she was at risk for kidney failure if she had a hysterectomy.  Pt desires endometrial ablation. Pt offered  obs vs surgery.  Pt chose surgery.  Plan D&C hysteroscopy thermachoice ablation.  Risks are but not limited to bleeding, infection, scarring of the uterus and perforation.     [place] A 09/20/2011, 11:41 AM

## 2011-12-10 ENCOUNTER — Encounter (HOSPITAL_COMMUNITY): Payer: Self-pay | Admitting: *Deleted

## 2011-12-10 ENCOUNTER — Ambulatory Visit (HOSPITAL_COMMUNITY): Payer: Medicare Other | Admitting: Anesthesiology

## 2011-12-10 ENCOUNTER — Encounter (HOSPITAL_COMMUNITY): Payer: Self-pay | Admitting: Anesthesiology

## 2011-12-10 ENCOUNTER — Ambulatory Visit (HOSPITAL_COMMUNITY)
Admission: RE | Admit: 2011-12-10 | Discharge: 2011-12-10 | Disposition: A | Discharge status: Home / Self Care | Payer: Medicare Other | Source: Ambulatory Visit | Attending: Obstetrics and Gynecology | Admitting: Obstetrics and Gynecology

## 2011-12-10 ENCOUNTER — Other Ambulatory Visit: Payer: Self-pay | Admitting: Obstetrics and Gynecology

## 2011-12-10 ENCOUNTER — Encounter (HOSPITAL_COMMUNITY)
Admission: RE | Disposition: A | Discharge status: Home / Self Care | Payer: Self-pay | Source: Ambulatory Visit | Attending: Obstetrics and Gynecology

## 2011-12-10 DIAGNOSIS — N921 Excessive and frequent menstruation with irregular cycle: Secondary | ICD-10-CM

## 2011-12-10 DIAGNOSIS — N920 Excessive and frequent menstruation with regular cycle: Secondary | ICD-10-CM | POA: Diagnosis not present

## 2011-12-10 DIAGNOSIS — N946 Dysmenorrhea, unspecified: Secondary | ICD-10-CM | POA: Diagnosis not present

## 2011-12-10 DIAGNOSIS — D261 Other benign neoplasm of corpus uteri: Secondary | ICD-10-CM | POA: Diagnosis not present

## 2011-12-10 DIAGNOSIS — Z9889 Other specified postprocedural states: Secondary | ICD-10-CM

## 2011-12-10 HISTORY — PX: HYSTEROSCOPY WITH THERMACHOICE: SHX5396

## 2011-12-10 LAB — COMPREHENSIVE METABOLIC PANEL
ALT: 8 U/L (ref 0–35)
AST: 17 U/L (ref 0–37)
CO2: 19 mEq/L (ref 19–32)
Calcium: 9.5 mg/dL (ref 8.4–10.5)
GFR calc non Af Amer: 8 mL/min — ABNORMAL LOW (ref >90)
Potassium: 3.8 mEq/L (ref 3.5–5.1)
Sodium: 134 mEq/L — ABNORMAL LOW (ref 135–145)
Total Protein: 7.1 g/dL (ref 6.0–8.3)

## 2011-12-10 SURGERY — HYSTEROSCOPY WITH THERMACHOICE
Anesthesia: General | Site: Uterus | Wound class: Clean Contaminated

## 2011-12-10 MED ORDER — ONDANSETRON HCL 4 MG/2ML IJ SOLN
INTRAMUSCULAR | Status: AC
  Filled 2011-12-10: qty 2

## 2011-12-10 MED ORDER — DEXTROSE 5 % IV SOLN
INTRAVENOUS | Status: DC | PRN
  Administered 2011-12-10: 1000 mL via INTRAVENOUS

## 2011-12-10 MED ORDER — FENTANYL CITRATE 0.05 MG/ML IJ SOLN
12.5000 ug | Freq: Once | INTRAMUSCULAR | Status: AC
  Administered 2011-12-10: 12.5 ug via INTRAVENOUS

## 2011-12-10 MED ORDER — LIDOCAINE HCL (CARDIAC) 20 MG/ML IV SOLN
INTRAVENOUS | Status: AC
  Filled 2011-12-10: qty 5

## 2011-12-10 MED ORDER — FENTANYL CITRATE 0.05 MG/ML IJ SOLN
INTRAMUSCULAR | Status: AC
  Filled 2011-12-10: qty 2

## 2011-12-10 MED ORDER — ONDANSETRON HCL 4 MG/2ML IJ SOLN: 4.0000 mg | Freq: Once | INTRAMUSCULAR | Status: DC | PRN

## 2011-12-10 MED ORDER — FENTANYL CITRATE 0.05 MG/ML IJ SOLN
INTRAMUSCULAR | Status: DC | PRN
  Administered 2011-12-10: 100 ug via INTRAVENOUS

## 2011-12-10 MED ORDER — MIDAZOLAM HCL 5 MG/5ML IJ SOLN
INTRAMUSCULAR | Status: DC | PRN
  Administered 2011-12-10 (×2): 1 mg via INTRAVENOUS

## 2011-12-10 MED ORDER — PROPOFOL 10 MG/ML IV EMUL
INTRAVENOUS | Status: AC
  Filled 2011-12-10: qty 20

## 2011-12-10 MED ORDER — MIDAZOLAM HCL 2 MG/2ML IJ SOLN
INTRAMUSCULAR | Status: AC
  Filled 2011-12-10: qty 2

## 2011-12-10 MED ORDER — LIDOCAINE HCL (CARDIAC) 20 MG/ML IV SOLN
INTRAVENOUS | Status: DC | PRN
  Administered 2011-12-10: 60 mg via INTRAVENOUS

## 2011-12-10 MED ORDER — LIDOCAINE HCL 2 % IJ SOLN
INTRAMUSCULAR | Status: DC | PRN
  Administered 2011-12-10: 20 mL

## 2011-12-10 MED ORDER — SILVER NITRATE-POT NITRATE 75-25 % EX MISC
CUTANEOUS | Status: AC
  Filled 2011-12-10: qty 1

## 2011-12-10 MED ORDER — PROPOFOL 10 MG/ML IV EMUL
INTRAVENOUS | Status: DC | PRN
  Administered 2011-12-10: 120 mg via INTRAVENOUS
  Administered 2011-12-10: 30 mg via INTRAVENOUS

## 2011-12-10 MED ORDER — SODIUM CHLORIDE 0.9 % IV SOLN
INTRAVENOUS | Status: DC
  Administered 2011-12-10: 08:00:00 via INTRAVENOUS

## 2011-12-10 MED ORDER — OXYCODONE HCL 5 MG PO TABS: 5.0000 mg | ORAL_TABLET | Freq: Four times a day (QID) | ORAL | Status: AC | PRN

## 2011-12-10 SURGICAL SUPPLY — 13 items
CANISTER SUCTION 2500CC (MISCELLANEOUS) ×2 IMPLANT
CATH ROBINSON RED A/P 16FR (CATHETERS) ×2 IMPLANT
CATH THERMACHOICE III (CATHETERS) ×2 IMPLANT
CLOTH BEACON ORANGE TIMEOUT ST (SAFETY) ×2 IMPLANT
CONTAINER PREFILL 10% NBF 60ML (FORM) ×4 IMPLANT
GLOVE BIO SURGEON STRL SZ 6.5 (GLOVE) ×4 IMPLANT
GLYCINE 1.5% IRRIG UROMATIC (IV SOLUTION) ×2 IMPLANT
GOWN PREVENTION PLUS LG XLONG (DISPOSABLE) ×2 IMPLANT
GOWN STRL REIN XL XLG (GOWN DISPOSABLE) ×2 IMPLANT
PACK HYSTEROSCOPY LF (CUSTOM PROCEDURE TRAY) ×2 IMPLANT
SYR 20CC LL (SYRINGE) ×2 IMPLANT
TOWEL OR 17X24 6PK STRL BLUE (TOWEL DISPOSABLE) ×4 IMPLANT
WATER STERILE IRR 1000ML POUR (IV SOLUTION) ×2 IMPLANT

## 2011-12-10 NOTE — Op Note (Signed)
Pre op DX: menometrorrhagia    Post Op [id number]   PHYSICIAN : Jaylinn Hellenbrand   ASSISTANTS: none   ANESTHESIA:   Gen LMA and paracervical block  ESTIMATED BLOOD LOSS: minimal  LOCAL MEDICATIONS USED:  LIDOCAINE 20CC  SPECIMEN:  Source of Specimen:  endometrial curettings  DISPOSITION OF SPECIMEN:  PATHOLOGY  COUNTS Correct:  YES    DICTATION #: The patient was taken to the operating room and prepped and draped in a normal sterile fashion. An in out catheter was used to drain the bladder.   A bivalve speculum was placed into the vagina and anterior lip of the cervix was grasped with a single-tooth tenaculum.  20 cc of 1% lidocaine was used for cervical block.the uterus did sound to 8 cm  the cervix was then dilated with Pratt dilators up to 21. The hysteroscope was placed into the uterine cavity. The  entire uterus and both ostia were visualized.   Hyseroscope was then removed from the uterus. A sharp curettage was then done with a curette and endometrial curettings were obtained. The endometrial curettings were sent to pathology.Thermachoice endometrial ablation was done per protocol.  The rep Leroy Sea was present for the procedure. Again the hysteroscope was placed into the uterine cavity. Both ostia were again visualized there were no polyps or submucosal fibroids or endometrial masses seen. The white blanching was seen all along there uterine fundus.The tenaculum was removed from the cervix and hemostasis was noted.   PLAN OF CARE: discharge to home  PATIENT DISPOSITION:  PACU - hemodynamically stable.

## 2011-12-10 NOTE — Transfer of Care (Signed)
Immediate Anesthesia Transfer of Care Note  Patient: [name]  Procedure(s) Performed:  HYSTEROSCOPY WITH THERMACHOICE  Patient Location: PACU  Anesthesia Type: General  Level of Consciousness: awake and oriented  Airway & Oxygen Therapy: Patient Spontanous Breathing and Patient connected to nasal cannula oxygen  Post-op Assessment: Report given to PACU RN  Post vital signs: Reviewed and stable  Complications: No apparent anesthesia complications

## 2011-12-10 NOTE — Interval H&P Note (Signed)
History and Physical Interval Note:  12/10/2011 9:25 AM  [name]  has presented today for surgery, with the diagnosis of Irregular Vaginal Bleeding  The various methods of treatment have been discussed with the patient and family. After consideration of risks, benefits and other options for treatment, the patient has consented to  Procedure(s): HYSTEROSCOPY WITH THERMACHOICE as a surgical intervention .  The patients' history has been reviewed, patient examined, no change in status, stable for surgery.  I have reviewed the patients' chart and labs.  Questions were answered to the patient's satisfaction.     [place] A  Date of Initial H&P: 11-09-11  History reviewed, patient examined, no change in status, stable for surgery.

## 2011-12-10 NOTE — Anesthesia Procedure Notes (Signed)
Procedure Name: LMA Insertion Date/Time: 12/10/2011 9:43 AM Performed by: Kerby Nora Pre-anesthesia Checklist: Patient identified, Emergency Drugs available, Suction available and Timeout performed Patient Re-evaluated:Patient Re-evaluated prior to inductionOxygen Delivery Method: Circle System Utilized Preoxygenation: Pre-oxygenation with 100% oxygen Intubation Type: IV induction Ventilation: Mask ventilation without difficulty LMA: LMA inserted LMA Size: 4.0 Grade View: Grade III Number of attempts: 1 Placement Confirmation: positive ETCO2 and breath sounds checked- equal and bilateral Dental Injury: Teeth and Oropharynx as per pre-operative assessment

## 2011-12-10 NOTE — Anesthesia Postprocedure Evaluation (Signed)
Anesthesia Post Note  Patient: [name]  Procedure(s) Performed:  HYSTEROSCOPY WITH THERMACHOICE  Anesthesia type: General  Patient location: PACU  Post pain: Pain level controlled  Post assessment: Post-op Vital signs reviewed  Last Vitals:  Filed Vitals:   12/10/11 1130  BP: 144/79  Pulse: 92  Temp:   Resp: 19    Post vital signs: Reviewed  Level of consciousness: sedated  Complications: No apparent anesthesia complicationsfj

## 2011-12-13 ENCOUNTER — Encounter (HOSPITAL_COMMUNITY): Payer: Self-pay | Admitting: Obstetrics and Gynecology

## 2011-12-13 ENCOUNTER — Encounter (HOSPITAL_COMMUNITY): Payer: Medicare Other

## 2011-12-15 ENCOUNTER — Other Ambulatory Visit (HOSPITAL_COMMUNITY): Payer: Self-pay | Admitting: *Deleted

## 2011-12-20 ENCOUNTER — Encounter (HOSPITAL_COMMUNITY)
Admission: RE | Admit: 2011-12-20 | Discharge: 2011-12-20 | Disposition: A | Discharge status: Home / Self Care | Payer: Medicare Other | Source: Ambulatory Visit | Attending: Nephrology | Admitting: Nephrology

## 2011-12-20 DIAGNOSIS — D649 Anemia, unspecified: Secondary | ICD-10-CM | POA: Insufficient documentation

## 2011-12-20 DIAGNOSIS — N186 End stage renal disease: Secondary | ICD-10-CM | POA: Diagnosis not present

## 2011-12-20 DIAGNOSIS — I120 Hypertensive chronic kidney disease with stage 5 chronic kidney disease or end stage renal disease: Secondary | ICD-10-CM | POA: Insufficient documentation

## 2011-12-20 LAB — RENAL FUNCTION PANEL
Albumin: 3.6 g/dL (ref 3.5–5.2)
BUN: 81 mg/dL — ABNORMAL HIGH (ref 6–23)
Chloride: 99 mEq/L (ref 96–112)
Creatinine, Ser: 6.54 mg/dL — ABNORMAL HIGH (ref 0.50–1.10)

## 2011-12-20 LAB — POCT HEMOGLOBIN-HEMACUE: Hemoglobin: 9.5 g/dL — ABNORMAL LOW (ref 12.0–15.0)

## 2011-12-20 LAB — IRON AND TIBC: Saturation Ratios: 36 % (ref 20–55)

## 2011-12-20 MED ORDER — EPOETIN ALFA 40000 UNIT/ML IJ SOLN
INTRAMUSCULAR | Status: AC
  Administered 2011-12-20: 40000 [IU] via SUBCUTANEOUS
  Filled 2011-12-20: qty 1

## 2011-12-20 MED ORDER — EPOETIN ALFA 10000 UNIT/ML IJ SOLN: 40000.0000 [IU] | INTRAMUSCULAR | Status: DC

## 2011-12-27 ENCOUNTER — Encounter (HOSPITAL_COMMUNITY): Payer: Medicare Other

## 2011-12-27 ENCOUNTER — Other Ambulatory Visit (HOSPITAL_COMMUNITY): Payer: Self-pay | Admitting: *Deleted

## 2011-12-28 ENCOUNTER — Encounter: Payer: Self-pay | Admitting: Vascular Surgery

## 2011-12-29 ENCOUNTER — Encounter (INDEPENDENT_AMBULATORY_CARE_PROVIDER_SITE_OTHER): Payer: Medicare Other | Admitting: Vascular Surgery

## 2011-12-29 ENCOUNTER — Ambulatory Visit (INDEPENDENT_AMBULATORY_CARE_PROVIDER_SITE_OTHER): Payer: Medicare Other | Admitting: Vascular Surgery

## 2011-12-29 ENCOUNTER — Encounter: Payer: Self-pay | Admitting: Vascular Surgery

## 2011-12-29 VITALS — BP 131/74 | HR 83 | Resp 16 | Ht 64.0 in | Wt 274.0 lb

## 2011-12-29 DIAGNOSIS — N186 End stage renal disease: Secondary | ICD-10-CM

## 2011-12-29 DIAGNOSIS — Z48812 Encounter for surgical aftercare following surgery on the circulatory system: Secondary | ICD-10-CM

## 2011-12-29 DIAGNOSIS — T82898A Other specified complication of vascular prosthetic devices, implants and grafts, initial encounter: Secondary | ICD-10-CM

## 2011-12-29 NOTE — Progress Notes (Signed)
[facility] [place]  Patient name: [name] MRN: [id number] DOB: 21-Jun-1971 Sex: female  REASON FOR VISIT: follow up after superficialization of left upper arm AV fistula and ligation of competing branches.  HPI: [name] is a 41 y.o. female who had a left brachiocephalic AV fistula. This appeared to be maturing nicely however was quite deep and for that reason likely not usable. I performed the above-mentioned procedure on 11/30/2011. She comes in for a follow up visit. She's had no real problems since her surgery area at she is not yet on dialysis. She's had no recent uremic symptoms.  REVIEW OF SYSTEMS: Valu.Nieves ] denotes positive finding; [  ] denotes negative finding  CONSTITUTIONAL:  [ ]  fever   [ ]  chills  PHYSICAL EXAM: Filed Vitals:   12/29/11 1501  BP: 131/74  Pulse: 83  Resp: 16  Height: 5\' 4"  (1.626 m)  Weight: 274 lb (124.286 kg)  SpO2: 100%   Body mass index is 47.03 kg/(m^2). GENERAL: The patient is a well-nourished female, in no acute distress. The vital signs are documented above. CARDIOVASCULAR: There is a regular rate and rhythm   PULMONARY: There is good air exchange bilaterally without wheezing or rales. Her left upper arm AV fistula has an excellent bruit and thrill. It appears to be easily accessible. The left hand is warm and well perfused. Her incisions are healing nicely.  DATA:  I have independently interpreted her vein map which shows that the fistula is approximately 0.3-0.39 cm in depth. She does have one area where there is a valve which causes some increased velocities but this does not appear to be limiting flow.  MEDICAL ISSUES: The fistula should be ready for access and approximately 4 weeks. Given the area of increased velocities at the site of the valve I have recommended a follow visit in 6 weeks and I'll see her back at that time. She knows to call sooner if she has problems.  [place] [facility] [place] Beeper: [phone]

## 2011-12-31 ENCOUNTER — Encounter: Payer: Self-pay | Admitting: Obstetrics and Gynecology

## 2011-12-31 DIAGNOSIS — F3189 Other bipolar disorder: Secondary | ICD-10-CM | POA: Diagnosis not present

## 2012-01-02 DIAGNOSIS — J209 Acute bronchitis, unspecified: Secondary | ICD-10-CM | POA: Diagnosis not present

## 2012-01-02 DIAGNOSIS — J45901 Unspecified asthma with (acute) exacerbation: Secondary | ICD-10-CM | POA: Diagnosis not present

## 2012-01-03 ENCOUNTER — Encounter (INDEPENDENT_AMBULATORY_CARE_PROVIDER_SITE_OTHER): Payer: Medicare Other | Admitting: Obstetrics and Gynecology

## 2012-01-03 ENCOUNTER — Encounter (HOSPITAL_COMMUNITY)
Admission: RE | Admit: 2012-01-03 | Discharge: 2012-01-03 | Disposition: A | Discharge status: Home / Self Care | Payer: Medicare Other | Source: Ambulatory Visit | Attending: Nephrology | Admitting: Nephrology

## 2012-01-03 DIAGNOSIS — N920 Excessive and frequent menstruation with regular cycle: Secondary | ICD-10-CM

## 2012-01-03 DIAGNOSIS — N186 End stage renal disease: Secondary | ICD-10-CM | POA: Insufficient documentation

## 2012-01-03 DIAGNOSIS — I120 Hypertensive chronic kidney disease with stage 5 chronic kidney disease or end stage renal disease: Secondary | ICD-10-CM | POA: Insufficient documentation

## 2012-01-03 DIAGNOSIS — N390 Urinary tract infection, site not specified: Secondary | ICD-10-CM | POA: Diagnosis not present

## 2012-01-03 DIAGNOSIS — D649 Anemia, unspecified: Secondary | ICD-10-CM | POA: Diagnosis not present

## 2012-01-03 LAB — RENAL FUNCTION PANEL
Albumin: 3.4 g/dL — ABNORMAL LOW (ref 3.5–5.2)
Calcium: 9.8 mg/dL (ref 8.4–10.5)
Chloride: 101 mEq/L (ref 96–112)
Creatinine, Ser: 5.84 mg/dL — ABNORMAL HIGH (ref 0.50–1.10)
GFR calc non Af Amer: 8 mL/min — ABNORMAL LOW (ref >90)
Phosphorus: 6.4 mg/dL — ABNORMAL HIGH (ref 2.3–4.6)

## 2012-01-03 MED ORDER — EPOETIN ALFA 40000 UNIT/ML IJ SOLN
INTRAMUSCULAR | Status: AC
  Administered 2012-01-03: 40000 [IU] via SUBCUTANEOUS
  Filled 2012-01-03: qty 1

## 2012-01-03 MED ORDER — EPOETIN ALFA 10000 UNIT/ML IJ SOLN: 40000.0000 [IU] | INTRAMUSCULAR | Status: DC

## 2012-01-04 DIAGNOSIS — M48061 Spinal stenosis, lumbar region without neurogenic claudication: Secondary | ICD-10-CM | POA: Diagnosis not present

## 2012-01-04 DIAGNOSIS — IMO0002 Reserved for concepts with insufficient information to code with codable children: Secondary | ICD-10-CM | POA: Diagnosis not present

## 2012-01-04 LAB — PTH, INTACT AND CALCIUM: PTH: 157 pg/mL — ABNORMAL HIGH (ref 14.0–72.0)

## 2012-01-05 NOTE — Procedures (Unsigned)
VASCULAR LAB EXAM  INDICATION:  Chronic kidney disease, stage IV, complications of arteriovenous fistula placement.  HISTORY: Diabetes:  No. Cardiac:  No. Hypertension:  Yes.  EXAM:  Left arteriovenous fistula duplex.  IMPRESSION: 1. Patent arterial inflow and outflow. 2. Venous outflow stenosis present at the subclavian confluence,     distal shoulder, and mid upper arm segments, as noted on worksheet. 3. A branch is noted at the distal shoulder level, as drawn on the     worksheet. 4. Left upper extremity venous outflow depth and diameters are as     noted on worksheet. 5. Tortuousity present throughout the venous outflow segment from the     distal upper arm to the shoulder.  ___________________________________________ Judeth Cornfield. Scot Dock, M.D.  SH/MEDQ  D:  12/29/2011  T:  12/29/2011  Job:  [id number]

## 2012-01-06 DIAGNOSIS — E876 Hypokalemia: Secondary | ICD-10-CM | POA: Diagnosis not present

## 2012-01-06 DIAGNOSIS — Z23 Encounter for immunization: Secondary | ICD-10-CM | POA: Diagnosis not present

## 2012-01-06 DIAGNOSIS — N039 Chronic nephritic syndrome with unspecified morphologic changes: Secondary | ICD-10-CM | POA: Diagnosis not present

## 2012-01-06 DIAGNOSIS — N186 End stage renal disease: Secondary | ICD-10-CM | POA: Diagnosis not present

## 2012-01-06 DIAGNOSIS — D509 Iron deficiency anemia, unspecified: Secondary | ICD-10-CM | POA: Diagnosis not present

## 2012-01-06 DIAGNOSIS — N2581 Secondary hyperparathyroidism of renal origin: Secondary | ICD-10-CM | POA: Diagnosis not present

## 2012-01-06 DIAGNOSIS — Z992 Dependence on renal dialysis: Secondary | ICD-10-CM | POA: Diagnosis not present

## 2012-01-08 DIAGNOSIS — Z23 Encounter for immunization: Secondary | ICD-10-CM | POA: Diagnosis not present

## 2012-01-08 DIAGNOSIS — E876 Hypokalemia: Secondary | ICD-10-CM | POA: Diagnosis not present

## 2012-01-08 DIAGNOSIS — D509 Iron deficiency anemia, unspecified: Secondary | ICD-10-CM | POA: Diagnosis not present

## 2012-01-08 DIAGNOSIS — N2581 Secondary hyperparathyroidism of renal origin: Secondary | ICD-10-CM | POA: Diagnosis not present

## 2012-01-08 DIAGNOSIS — Z992 Dependence on renal dialysis: Secondary | ICD-10-CM | POA: Diagnosis not present

## 2012-01-08 DIAGNOSIS — N186 End stage renal disease: Secondary | ICD-10-CM | POA: Diagnosis not present

## 2012-01-11 DIAGNOSIS — Z992 Dependence on renal dialysis: Secondary | ICD-10-CM | POA: Diagnosis not present

## 2012-01-11 DIAGNOSIS — E876 Hypokalemia: Secondary | ICD-10-CM | POA: Diagnosis not present

## 2012-01-11 DIAGNOSIS — Z23 Encounter for immunization: Secondary | ICD-10-CM | POA: Diagnosis not present

## 2012-01-11 DIAGNOSIS — D509 Iron deficiency anemia, unspecified: Secondary | ICD-10-CM | POA: Diagnosis not present

## 2012-01-11 DIAGNOSIS — N186 End stage renal disease: Secondary | ICD-10-CM | POA: Diagnosis not present

## 2012-01-11 DIAGNOSIS — N2581 Secondary hyperparathyroidism of renal origin: Secondary | ICD-10-CM | POA: Diagnosis not present

## 2012-01-13 DIAGNOSIS — E876 Hypokalemia: Secondary | ICD-10-CM | POA: Diagnosis not present

## 2012-01-13 DIAGNOSIS — N2581 Secondary hyperparathyroidism of renal origin: Secondary | ICD-10-CM | POA: Diagnosis not present

## 2012-01-13 DIAGNOSIS — D509 Iron deficiency anemia, unspecified: Secondary | ICD-10-CM | POA: Diagnosis not present

## 2012-01-13 DIAGNOSIS — Z23 Encounter for immunization: Secondary | ICD-10-CM | POA: Diagnosis not present

## 2012-01-13 DIAGNOSIS — N186 End stage renal disease: Secondary | ICD-10-CM | POA: Diagnosis not present

## 2012-01-13 DIAGNOSIS — Z992 Dependence on renal dialysis: Secondary | ICD-10-CM | POA: Diagnosis not present

## 2012-01-15 DIAGNOSIS — N186 End stage renal disease: Secondary | ICD-10-CM | POA: Diagnosis not present

## 2012-01-15 DIAGNOSIS — E876 Hypokalemia: Secondary | ICD-10-CM | POA: Diagnosis not present

## 2012-01-15 DIAGNOSIS — Z23 Encounter for immunization: Secondary | ICD-10-CM | POA: Diagnosis not present

## 2012-01-15 DIAGNOSIS — N2581 Secondary hyperparathyroidism of renal origin: Secondary | ICD-10-CM | POA: Diagnosis not present

## 2012-01-15 DIAGNOSIS — Z992 Dependence on renal dialysis: Secondary | ICD-10-CM | POA: Diagnosis not present

## 2012-01-15 DIAGNOSIS — D509 Iron deficiency anemia, unspecified: Secondary | ICD-10-CM | POA: Diagnosis not present

## 2012-01-18 DIAGNOSIS — D509 Iron deficiency anemia, unspecified: Secondary | ICD-10-CM | POA: Diagnosis not present

## 2012-01-18 DIAGNOSIS — Z23 Encounter for immunization: Secondary | ICD-10-CM | POA: Diagnosis not present

## 2012-01-18 DIAGNOSIS — N2581 Secondary hyperparathyroidism of renal origin: Secondary | ICD-10-CM | POA: Diagnosis not present

## 2012-01-18 DIAGNOSIS — N186 End stage renal disease: Secondary | ICD-10-CM | POA: Diagnosis not present

## 2012-01-18 DIAGNOSIS — E876 Hypokalemia: Secondary | ICD-10-CM | POA: Diagnosis not present

## 2012-01-18 DIAGNOSIS — Z992 Dependence on renal dialysis: Secondary | ICD-10-CM | POA: Diagnosis not present

## 2012-01-20 ENCOUNTER — Encounter (HOSPITAL_COMMUNITY): Payer: Medicare Other

## 2012-01-20 DIAGNOSIS — Z992 Dependence on renal dialysis: Secondary | ICD-10-CM | POA: Diagnosis not present

## 2012-01-20 DIAGNOSIS — N2581 Secondary hyperparathyroidism of renal origin: Secondary | ICD-10-CM | POA: Diagnosis not present

## 2012-01-20 DIAGNOSIS — D509 Iron deficiency anemia, unspecified: Secondary | ICD-10-CM | POA: Diagnosis not present

## 2012-01-20 DIAGNOSIS — Z23 Encounter for immunization: Secondary | ICD-10-CM | POA: Diagnosis not present

## 2012-01-20 DIAGNOSIS — E876 Hypokalemia: Secondary | ICD-10-CM | POA: Diagnosis not present

## 2012-01-20 DIAGNOSIS — N186 End stage renal disease: Secondary | ICD-10-CM | POA: Diagnosis not present

## 2012-01-22 DIAGNOSIS — Z992 Dependence on renal dialysis: Secondary | ICD-10-CM | POA: Diagnosis not present

## 2012-01-22 DIAGNOSIS — Z23 Encounter for immunization: Secondary | ICD-10-CM | POA: Diagnosis not present

## 2012-01-22 DIAGNOSIS — E876 Hypokalemia: Secondary | ICD-10-CM | POA: Diagnosis not present

## 2012-01-22 DIAGNOSIS — D509 Iron deficiency anemia, unspecified: Secondary | ICD-10-CM | POA: Diagnosis not present

## 2012-01-22 DIAGNOSIS — N186 End stage renal disease: Secondary | ICD-10-CM | POA: Diagnosis not present

## 2012-01-22 DIAGNOSIS — N2581 Secondary hyperparathyroidism of renal origin: Secondary | ICD-10-CM | POA: Diagnosis not present

## 2012-01-25 DIAGNOSIS — Z23 Encounter for immunization: Secondary | ICD-10-CM | POA: Diagnosis not present

## 2012-01-25 DIAGNOSIS — D509 Iron deficiency anemia, unspecified: Secondary | ICD-10-CM | POA: Diagnosis not present

## 2012-01-25 DIAGNOSIS — N186 End stage renal disease: Secondary | ICD-10-CM | POA: Diagnosis not present

## 2012-01-25 DIAGNOSIS — N2581 Secondary hyperparathyroidism of renal origin: Secondary | ICD-10-CM | POA: Diagnosis not present

## 2012-01-25 DIAGNOSIS — Z992 Dependence on renal dialysis: Secondary | ICD-10-CM | POA: Diagnosis not present

## 2012-01-25 DIAGNOSIS — E876 Hypokalemia: Secondary | ICD-10-CM | POA: Diagnosis not present

## 2012-01-27 DIAGNOSIS — Z992 Dependence on renal dialysis: Secondary | ICD-10-CM | POA: Diagnosis not present

## 2012-01-27 DIAGNOSIS — N186 End stage renal disease: Secondary | ICD-10-CM | POA: Diagnosis not present

## 2012-01-27 DIAGNOSIS — E876 Hypokalemia: Secondary | ICD-10-CM | POA: Diagnosis not present

## 2012-01-27 DIAGNOSIS — N2581 Secondary hyperparathyroidism of renal origin: Secondary | ICD-10-CM | POA: Diagnosis not present

## 2012-01-27 DIAGNOSIS — Z23 Encounter for immunization: Secondary | ICD-10-CM | POA: Diagnosis not present

## 2012-01-27 DIAGNOSIS — D509 Iron deficiency anemia, unspecified: Secondary | ICD-10-CM | POA: Diagnosis not present

## 2012-01-29 DIAGNOSIS — D509 Iron deficiency anemia, unspecified: Secondary | ICD-10-CM | POA: Diagnosis not present

## 2012-01-29 DIAGNOSIS — N186 End stage renal disease: Secondary | ICD-10-CM | POA: Diagnosis not present

## 2012-01-29 DIAGNOSIS — E876 Hypokalemia: Secondary | ICD-10-CM | POA: Diagnosis not present

## 2012-01-29 DIAGNOSIS — N2581 Secondary hyperparathyroidism of renal origin: Secondary | ICD-10-CM | POA: Diagnosis not present

## 2012-01-29 DIAGNOSIS — Z23 Encounter for immunization: Secondary | ICD-10-CM | POA: Diagnosis not present

## 2012-01-29 DIAGNOSIS — Z992 Dependence on renal dialysis: Secondary | ICD-10-CM | POA: Diagnosis not present

## 2012-01-30 DIAGNOSIS — N186 End stage renal disease: Secondary | ICD-10-CM | POA: Diagnosis not present

## 2012-01-31 DIAGNOSIS — Z992 Dependence on renal dialysis: Secondary | ICD-10-CM | POA: Diagnosis not present

## 2012-01-31 DIAGNOSIS — N2581 Secondary hyperparathyroidism of renal origin: Secondary | ICD-10-CM | POA: Diagnosis not present

## 2012-01-31 DIAGNOSIS — E876 Hypokalemia: Secondary | ICD-10-CM | POA: Diagnosis not present

## 2012-01-31 DIAGNOSIS — D631 Anemia in chronic kidney disease: Secondary | ICD-10-CM | POA: Diagnosis not present

## 2012-01-31 DIAGNOSIS — Z23 Encounter for immunization: Secondary | ICD-10-CM | POA: Diagnosis not present

## 2012-01-31 DIAGNOSIS — D509 Iron deficiency anemia, unspecified: Secondary | ICD-10-CM | POA: Diagnosis not present

## 2012-01-31 DIAGNOSIS — N186 End stage renal disease: Secondary | ICD-10-CM | POA: Diagnosis not present

## 2012-02-03 DIAGNOSIS — Z23 Encounter for immunization: Secondary | ICD-10-CM | POA: Diagnosis not present

## 2012-02-03 DIAGNOSIS — Z992 Dependence on renal dialysis: Secondary | ICD-10-CM | POA: Diagnosis not present

## 2012-02-03 DIAGNOSIS — E876 Hypokalemia: Secondary | ICD-10-CM | POA: Diagnosis not present

## 2012-02-03 DIAGNOSIS — N2581 Secondary hyperparathyroidism of renal origin: Secondary | ICD-10-CM | POA: Diagnosis not present

## 2012-02-03 DIAGNOSIS — N186 End stage renal disease: Secondary | ICD-10-CM | POA: Diagnosis not present

## 2012-02-03 DIAGNOSIS — D509 Iron deficiency anemia, unspecified: Secondary | ICD-10-CM | POA: Diagnosis not present

## 2012-02-05 DIAGNOSIS — Z992 Dependence on renal dialysis: Secondary | ICD-10-CM | POA: Diagnosis not present

## 2012-02-05 DIAGNOSIS — Z23 Encounter for immunization: Secondary | ICD-10-CM | POA: Diagnosis not present

## 2012-02-05 DIAGNOSIS — E876 Hypokalemia: Secondary | ICD-10-CM | POA: Diagnosis not present

## 2012-02-05 DIAGNOSIS — N2581 Secondary hyperparathyroidism of renal origin: Secondary | ICD-10-CM | POA: Diagnosis not present

## 2012-02-05 DIAGNOSIS — D509 Iron deficiency anemia, unspecified: Secondary | ICD-10-CM | POA: Diagnosis not present

## 2012-02-05 DIAGNOSIS — N186 End stage renal disease: Secondary | ICD-10-CM | POA: Diagnosis not present

## 2012-02-08 DIAGNOSIS — E876 Hypokalemia: Secondary | ICD-10-CM | POA: Diagnosis not present

## 2012-02-08 DIAGNOSIS — Z992 Dependence on renal dialysis: Secondary | ICD-10-CM | POA: Diagnosis not present

## 2012-02-08 DIAGNOSIS — N186 End stage renal disease: Secondary | ICD-10-CM | POA: Diagnosis not present

## 2012-02-08 DIAGNOSIS — N2581 Secondary hyperparathyroidism of renal origin: Secondary | ICD-10-CM | POA: Diagnosis not present

## 2012-02-08 DIAGNOSIS — D509 Iron deficiency anemia, unspecified: Secondary | ICD-10-CM | POA: Diagnosis not present

## 2012-02-08 DIAGNOSIS — Z23 Encounter for immunization: Secondary | ICD-10-CM | POA: Diagnosis not present

## 2012-02-10 DIAGNOSIS — N2581 Secondary hyperparathyroidism of renal origin: Secondary | ICD-10-CM | POA: Diagnosis not present

## 2012-02-10 DIAGNOSIS — E876 Hypokalemia: Secondary | ICD-10-CM | POA: Diagnosis not present

## 2012-02-10 DIAGNOSIS — N186 End stage renal disease: Secondary | ICD-10-CM | POA: Diagnosis not present

## 2012-02-10 DIAGNOSIS — D509 Iron deficiency anemia, unspecified: Secondary | ICD-10-CM | POA: Diagnosis not present

## 2012-02-10 DIAGNOSIS — Z992 Dependence on renal dialysis: Secondary | ICD-10-CM | POA: Diagnosis not present

## 2012-02-10 DIAGNOSIS — Z23 Encounter for immunization: Secondary | ICD-10-CM | POA: Diagnosis not present

## 2012-02-12 DIAGNOSIS — Z23 Encounter for immunization: Secondary | ICD-10-CM | POA: Diagnosis not present

## 2012-02-12 DIAGNOSIS — N2581 Secondary hyperparathyroidism of renal origin: Secondary | ICD-10-CM | POA: Diagnosis not present

## 2012-02-12 DIAGNOSIS — N186 End stage renal disease: Secondary | ICD-10-CM | POA: Diagnosis not present

## 2012-02-12 DIAGNOSIS — Z992 Dependence on renal dialysis: Secondary | ICD-10-CM | POA: Diagnosis not present

## 2012-02-12 DIAGNOSIS — D509 Iron deficiency anemia, unspecified: Secondary | ICD-10-CM | POA: Diagnosis not present

## 2012-02-12 DIAGNOSIS — E876 Hypokalemia: Secondary | ICD-10-CM | POA: Diagnosis not present

## 2012-02-14 DIAGNOSIS — Z23 Encounter for immunization: Secondary | ICD-10-CM | POA: Diagnosis not present

## 2012-02-15 ENCOUNTER — Encounter: Payer: Self-pay | Admitting: Neurosurgery

## 2012-02-15 DIAGNOSIS — N186 End stage renal disease: Secondary | ICD-10-CM | POA: Diagnosis not present

## 2012-02-15 DIAGNOSIS — Z992 Dependence on renal dialysis: Secondary | ICD-10-CM | POA: Diagnosis not present

## 2012-02-15 DIAGNOSIS — Z23 Encounter for immunization: Secondary | ICD-10-CM | POA: Diagnosis not present

## 2012-02-15 DIAGNOSIS — N2581 Secondary hyperparathyroidism of renal origin: Secondary | ICD-10-CM | POA: Diagnosis not present

## 2012-02-15 DIAGNOSIS — E876 Hypokalemia: Secondary | ICD-10-CM | POA: Diagnosis not present

## 2012-02-15 DIAGNOSIS — D509 Iron deficiency anemia, unspecified: Secondary | ICD-10-CM | POA: Diagnosis not present

## 2012-02-16 ENCOUNTER — Ambulatory Visit (INDEPENDENT_AMBULATORY_CARE_PROVIDER_SITE_OTHER): Payer: Medicare Other | Admitting: Neurosurgery

## 2012-02-16 ENCOUNTER — Ambulatory Visit (INDEPENDENT_AMBULATORY_CARE_PROVIDER_SITE_OTHER): Payer: Medicare Other | Admitting: Vascular Surgery

## 2012-02-16 ENCOUNTER — Encounter: Payer: Self-pay | Admitting: Neurosurgery

## 2012-02-16 VITALS — BP 147/81 | HR 71 | Resp 16 | Ht 64.0 in | Wt 261.3 lb

## 2012-02-16 DIAGNOSIS — T82898A Other specified complication of vascular prosthetic devices, implants and grafts, initial encounter: Secondary | ICD-10-CM

## 2012-02-16 DIAGNOSIS — N186 End stage renal disease: Secondary | ICD-10-CM | POA: Diagnosis not present

## 2012-02-16 NOTE — Progress Notes (Signed)
Subjective:     Patient ID: [name], female   DOB: [birth year], 41 y.o.   MRN: [id number]  HPI: This is a 41 year old female patient of Dr. Scot Dock seen status post superficialization of upper left arm AV fistula. She is doing well she reports no problems and they are using the fistula for her dialysis Tuesday Thursday and Saturday he reports no new medical problems.   Review of Systems: 12 point review of systems is notable for the difficulties described above otherwise unremarkable    Objective:   Physical Exam: Patient has good radial pulses bilaterally, the fistula has a good thrill palpation and is healed well.  height is 5\' 4"  (1.626 m) and weight is 261 lb 4.8 oz (118.525 kg). Her blood pressure is 147/81 and her pulse is 71. Her respiration is 16 and oxygen saturation is 97%.  Lower extremity pulses are patent and palpable     Assessment:     Assessment as above, left arm fistula duplex today shows the depth that 0.52, and again is usable for her dialysis.    Plan:     I discussed the AV fistula duplex with Dr. Scot Dock he states as long as she can use it for dialysis and she has no issues she can return when necessary. Patient agrees, her questions were encouraged and answered  Beatris Ship ANP  Clinic M.D.: Scot Dock

## 2012-02-16 NOTE — Progress Notes (Signed)
Left AVF duplex performed 02/16/2012 @ [facility]

## 2012-02-17 DIAGNOSIS — N2581 Secondary hyperparathyroidism of renal origin: Secondary | ICD-10-CM | POA: Diagnosis not present

## 2012-02-17 DIAGNOSIS — Z992 Dependence on renal dialysis: Secondary | ICD-10-CM | POA: Diagnosis not present

## 2012-02-17 DIAGNOSIS — D509 Iron deficiency anemia, unspecified: Secondary | ICD-10-CM | POA: Diagnosis not present

## 2012-02-17 DIAGNOSIS — E876 Hypokalemia: Secondary | ICD-10-CM | POA: Diagnosis not present

## 2012-02-17 DIAGNOSIS — Z23 Encounter for immunization: Secondary | ICD-10-CM | POA: Diagnosis not present

## 2012-02-17 DIAGNOSIS — N186 End stage renal disease: Secondary | ICD-10-CM | POA: Diagnosis not present

## 2012-02-19 DIAGNOSIS — N186 End stage renal disease: Secondary | ICD-10-CM | POA: Diagnosis not present

## 2012-02-19 DIAGNOSIS — Z992 Dependence on renal dialysis: Secondary | ICD-10-CM | POA: Diagnosis not present

## 2012-02-19 DIAGNOSIS — N2581 Secondary hyperparathyroidism of renal origin: Secondary | ICD-10-CM | POA: Diagnosis not present

## 2012-02-19 DIAGNOSIS — D509 Iron deficiency anemia, unspecified: Secondary | ICD-10-CM | POA: Diagnosis not present

## 2012-02-19 DIAGNOSIS — E876 Hypokalemia: Secondary | ICD-10-CM | POA: Diagnosis not present

## 2012-02-19 DIAGNOSIS — Z23 Encounter for immunization: Secondary | ICD-10-CM | POA: Diagnosis not present

## 2012-02-22 DIAGNOSIS — Z23 Encounter for immunization: Secondary | ICD-10-CM | POA: Diagnosis not present

## 2012-02-22 DIAGNOSIS — E876 Hypokalemia: Secondary | ICD-10-CM | POA: Diagnosis not present

## 2012-02-22 DIAGNOSIS — Z992 Dependence on renal dialysis: Secondary | ICD-10-CM | POA: Diagnosis not present

## 2012-02-22 DIAGNOSIS — D509 Iron deficiency anemia, unspecified: Secondary | ICD-10-CM | POA: Diagnosis not present

## 2012-02-22 DIAGNOSIS — N186 End stage renal disease: Secondary | ICD-10-CM | POA: Diagnosis not present

## 2012-02-22 DIAGNOSIS — N2581 Secondary hyperparathyroidism of renal origin: Secondary | ICD-10-CM | POA: Diagnosis not present

## 2012-02-24 DIAGNOSIS — N186 End stage renal disease: Secondary | ICD-10-CM | POA: Diagnosis not present

## 2012-02-24 DIAGNOSIS — D509 Iron deficiency anemia, unspecified: Secondary | ICD-10-CM | POA: Diagnosis not present

## 2012-02-24 DIAGNOSIS — N2581 Secondary hyperparathyroidism of renal origin: Secondary | ICD-10-CM | POA: Diagnosis not present

## 2012-02-24 DIAGNOSIS — Z992 Dependence on renal dialysis: Secondary | ICD-10-CM | POA: Diagnosis not present

## 2012-02-24 DIAGNOSIS — E876 Hypokalemia: Secondary | ICD-10-CM | POA: Diagnosis not present

## 2012-02-24 DIAGNOSIS — Z23 Encounter for immunization: Secondary | ICD-10-CM | POA: Diagnosis not present

## 2012-02-28 DIAGNOSIS — N186 End stage renal disease: Secondary | ICD-10-CM | POA: Diagnosis not present

## 2012-02-28 DIAGNOSIS — E876 Hypokalemia: Secondary | ICD-10-CM | POA: Diagnosis not present

## 2012-02-28 DIAGNOSIS — D509 Iron deficiency anemia, unspecified: Secondary | ICD-10-CM | POA: Diagnosis not present

## 2012-02-28 DIAGNOSIS — N2581 Secondary hyperparathyroidism of renal origin: Secondary | ICD-10-CM | POA: Diagnosis not present

## 2012-02-28 DIAGNOSIS — Z992 Dependence on renal dialysis: Secondary | ICD-10-CM | POA: Diagnosis not present

## 2012-02-28 DIAGNOSIS — Z23 Encounter for immunization: Secondary | ICD-10-CM | POA: Diagnosis not present

## 2012-02-29 DIAGNOSIS — E876 Hypokalemia: Secondary | ICD-10-CM | POA: Diagnosis not present

## 2012-02-29 DIAGNOSIS — N2581 Secondary hyperparathyroidism of renal origin: Secondary | ICD-10-CM | POA: Diagnosis not present

## 2012-02-29 DIAGNOSIS — D509 Iron deficiency anemia, unspecified: Secondary | ICD-10-CM | POA: Diagnosis not present

## 2012-02-29 DIAGNOSIS — Z992 Dependence on renal dialysis: Secondary | ICD-10-CM | POA: Diagnosis not present

## 2012-02-29 DIAGNOSIS — Z23 Encounter for immunization: Secondary | ICD-10-CM | POA: Diagnosis not present

## 2012-02-29 DIAGNOSIS — N186 End stage renal disease: Secondary | ICD-10-CM | POA: Diagnosis not present

## 2012-03-01 NOTE — Procedures (Unsigned)
VASCULAR LAB EXAM  INDICATION:  Non-maturing arteriovenous fistula with complications and end-stage renal disease.  HISTORY: Diabetes:  No Cardiac:  No Hypertension:  Yes  EXAM:  Left arteriovenous duplex.  IMPRESSION: 1. Patent arterial inflow and outflow. 2. Elevated velocities with diameter changes present, suggesting     stenosis at the mid and distal shoulder venous outflow segments. 3. Venous outflow depths and diameters are as noted on worksheet. 4. Tortuosity present throughout the venous outflow from the distal     upper arm to the shoulder segment.  ___________________________________________ Judeth Cornfield. Scot Dock, M.D.  SH/MEDQ  D:  02/16/2012  T:  02/16/2012  Job:  [id number]

## 2012-03-02 DIAGNOSIS — Z23 Encounter for immunization: Secondary | ICD-10-CM | POA: Diagnosis not present

## 2012-03-02 DIAGNOSIS — N2581 Secondary hyperparathyroidism of renal origin: Secondary | ICD-10-CM | POA: Diagnosis not present

## 2012-03-02 DIAGNOSIS — Z992 Dependence on renal dialysis: Secondary | ICD-10-CM | POA: Diagnosis not present

## 2012-03-02 DIAGNOSIS — D509 Iron deficiency anemia, unspecified: Secondary | ICD-10-CM | POA: Diagnosis not present

## 2012-03-02 DIAGNOSIS — E876 Hypokalemia: Secondary | ICD-10-CM | POA: Diagnosis not present

## 2012-03-02 DIAGNOSIS — E8779 Other fluid overload: Secondary | ICD-10-CM | POA: Diagnosis not present

## 2012-03-02 DIAGNOSIS — N186 End stage renal disease: Secondary | ICD-10-CM | POA: Diagnosis not present

## 2012-03-02 DIAGNOSIS — D631 Anemia in chronic kidney disease: Secondary | ICD-10-CM | POA: Diagnosis not present

## 2012-03-04 DIAGNOSIS — D509 Iron deficiency anemia, unspecified: Secondary | ICD-10-CM | POA: Diagnosis not present

## 2012-03-04 DIAGNOSIS — N186 End stage renal disease: Secondary | ICD-10-CM | POA: Diagnosis not present

## 2012-03-04 DIAGNOSIS — Z992 Dependence on renal dialysis: Secondary | ICD-10-CM | POA: Diagnosis not present

## 2012-03-04 DIAGNOSIS — Z23 Encounter for immunization: Secondary | ICD-10-CM | POA: Diagnosis not present

## 2012-03-04 DIAGNOSIS — N2581 Secondary hyperparathyroidism of renal origin: Secondary | ICD-10-CM | POA: Diagnosis not present

## 2012-03-04 DIAGNOSIS — E876 Hypokalemia: Secondary | ICD-10-CM | POA: Diagnosis not present

## 2012-03-06 DIAGNOSIS — N2581 Secondary hyperparathyroidism of renal origin: Secondary | ICD-10-CM | POA: Diagnosis not present

## 2012-03-06 DIAGNOSIS — Z992 Dependence on renal dialysis: Secondary | ICD-10-CM | POA: Diagnosis not present

## 2012-03-06 DIAGNOSIS — E876 Hypokalemia: Secondary | ICD-10-CM | POA: Diagnosis not present

## 2012-03-06 DIAGNOSIS — N186 End stage renal disease: Secondary | ICD-10-CM | POA: Diagnosis not present

## 2012-03-06 DIAGNOSIS — D509 Iron deficiency anemia, unspecified: Secondary | ICD-10-CM | POA: Diagnosis not present

## 2012-03-06 DIAGNOSIS — Z23 Encounter for immunization: Secondary | ICD-10-CM | POA: Diagnosis not present

## 2012-03-06 DIAGNOSIS — IMO0002 Reserved for concepts with insufficient information to code with codable children: Secondary | ICD-10-CM | POA: Diagnosis not present

## 2012-03-06 DIAGNOSIS — M48061 Spinal stenosis, lumbar region without neurogenic claudication: Secondary | ICD-10-CM | POA: Diagnosis not present

## 2012-03-09 DIAGNOSIS — D509 Iron deficiency anemia, unspecified: Secondary | ICD-10-CM | POA: Diagnosis not present

## 2012-03-09 DIAGNOSIS — N2581 Secondary hyperparathyroidism of renal origin: Secondary | ICD-10-CM | POA: Diagnosis not present

## 2012-03-09 DIAGNOSIS — Z992 Dependence on renal dialysis: Secondary | ICD-10-CM | POA: Diagnosis not present

## 2012-03-09 DIAGNOSIS — E876 Hypokalemia: Secondary | ICD-10-CM | POA: Diagnosis not present

## 2012-03-09 DIAGNOSIS — Z23 Encounter for immunization: Secondary | ICD-10-CM | POA: Diagnosis not present

## 2012-03-09 DIAGNOSIS — N186 End stage renal disease: Secondary | ICD-10-CM | POA: Diagnosis not present

## 2012-03-11 DIAGNOSIS — Z992 Dependence on renal dialysis: Secondary | ICD-10-CM | POA: Diagnosis not present

## 2012-03-11 DIAGNOSIS — E876 Hypokalemia: Secondary | ICD-10-CM | POA: Diagnosis not present

## 2012-03-11 DIAGNOSIS — Z23 Encounter for immunization: Secondary | ICD-10-CM | POA: Diagnosis not present

## 2012-03-11 DIAGNOSIS — N186 End stage renal disease: Secondary | ICD-10-CM | POA: Diagnosis not present

## 2012-03-11 DIAGNOSIS — N2581 Secondary hyperparathyroidism of renal origin: Secondary | ICD-10-CM | POA: Diagnosis not present

## 2012-03-11 DIAGNOSIS — D509 Iron deficiency anemia, unspecified: Secondary | ICD-10-CM | POA: Diagnosis not present

## 2012-03-14 DIAGNOSIS — Z23 Encounter for immunization: Secondary | ICD-10-CM | POA: Diagnosis not present

## 2012-03-14 DIAGNOSIS — E876 Hypokalemia: Secondary | ICD-10-CM | POA: Diagnosis not present

## 2012-03-14 DIAGNOSIS — N2581 Secondary hyperparathyroidism of renal origin: Secondary | ICD-10-CM | POA: Diagnosis not present

## 2012-03-14 DIAGNOSIS — Z992 Dependence on renal dialysis: Secondary | ICD-10-CM | POA: Diagnosis not present

## 2012-03-14 DIAGNOSIS — N186 End stage renal disease: Secondary | ICD-10-CM | POA: Diagnosis not present

## 2012-03-14 DIAGNOSIS — D509 Iron deficiency anemia, unspecified: Secondary | ICD-10-CM | POA: Diagnosis not present

## 2012-03-16 DIAGNOSIS — E876 Hypokalemia: Secondary | ICD-10-CM | POA: Diagnosis not present

## 2012-03-16 DIAGNOSIS — Z23 Encounter for immunization: Secondary | ICD-10-CM | POA: Diagnosis not present

## 2012-03-16 DIAGNOSIS — Z992 Dependence on renal dialysis: Secondary | ICD-10-CM | POA: Diagnosis not present

## 2012-03-16 DIAGNOSIS — N2581 Secondary hyperparathyroidism of renal origin: Secondary | ICD-10-CM | POA: Diagnosis not present

## 2012-03-16 DIAGNOSIS — D509 Iron deficiency anemia, unspecified: Secondary | ICD-10-CM | POA: Diagnosis not present

## 2012-03-16 DIAGNOSIS — N186 End stage renal disease: Secondary | ICD-10-CM | POA: Diagnosis not present

## 2012-03-18 DIAGNOSIS — N186 End stage renal disease: Secondary | ICD-10-CM | POA: Diagnosis not present

## 2012-03-18 DIAGNOSIS — N2581 Secondary hyperparathyroidism of renal origin: Secondary | ICD-10-CM | POA: Diagnosis not present

## 2012-03-18 DIAGNOSIS — Z992 Dependence on renal dialysis: Secondary | ICD-10-CM | POA: Diagnosis not present

## 2012-03-18 DIAGNOSIS — E876 Hypokalemia: Secondary | ICD-10-CM | POA: Diagnosis not present

## 2012-03-18 DIAGNOSIS — Z23 Encounter for immunization: Secondary | ICD-10-CM | POA: Diagnosis not present

## 2012-03-18 DIAGNOSIS — D509 Iron deficiency anemia, unspecified: Secondary | ICD-10-CM | POA: Diagnosis not present

## 2012-03-20 DIAGNOSIS — N186 End stage renal disease: Secondary | ICD-10-CM | POA: Diagnosis not present

## 2012-03-20 DIAGNOSIS — E876 Hypokalemia: Secondary | ICD-10-CM | POA: Diagnosis not present

## 2012-03-20 DIAGNOSIS — N2581 Secondary hyperparathyroidism of renal origin: Secondary | ICD-10-CM | POA: Diagnosis not present

## 2012-03-20 DIAGNOSIS — D509 Iron deficiency anemia, unspecified: Secondary | ICD-10-CM | POA: Diagnosis not present

## 2012-03-20 DIAGNOSIS — Z23 Encounter for immunization: Secondary | ICD-10-CM | POA: Diagnosis not present

## 2012-03-20 DIAGNOSIS — Z992 Dependence on renal dialysis: Secondary | ICD-10-CM | POA: Diagnosis not present

## 2012-03-22 DIAGNOSIS — N2581 Secondary hyperparathyroidism of renal origin: Secondary | ICD-10-CM | POA: Diagnosis not present

## 2012-03-22 DIAGNOSIS — N186 End stage renal disease: Secondary | ICD-10-CM | POA: Diagnosis not present

## 2012-03-22 DIAGNOSIS — Z992 Dependence on renal dialysis: Secondary | ICD-10-CM | POA: Diagnosis not present

## 2012-03-22 DIAGNOSIS — D509 Iron deficiency anemia, unspecified: Secondary | ICD-10-CM | POA: Diagnosis not present

## 2012-03-22 DIAGNOSIS — E876 Hypokalemia: Secondary | ICD-10-CM | POA: Diagnosis not present

## 2012-03-22 DIAGNOSIS — Z23 Encounter for immunization: Secondary | ICD-10-CM | POA: Diagnosis not present

## 2012-03-24 DIAGNOSIS — N2581 Secondary hyperparathyroidism of renal origin: Secondary | ICD-10-CM | POA: Diagnosis not present

## 2012-03-24 DIAGNOSIS — D509 Iron deficiency anemia, unspecified: Secondary | ICD-10-CM | POA: Diagnosis not present

## 2012-03-24 DIAGNOSIS — E876 Hypokalemia: Secondary | ICD-10-CM | POA: Diagnosis not present

## 2012-03-24 DIAGNOSIS — Z992 Dependence on renal dialysis: Secondary | ICD-10-CM | POA: Diagnosis not present

## 2012-03-24 DIAGNOSIS — N186 End stage renal disease: Secondary | ICD-10-CM | POA: Diagnosis not present

## 2012-03-24 DIAGNOSIS — Z23 Encounter for immunization: Secondary | ICD-10-CM | POA: Diagnosis not present

## 2012-03-27 DIAGNOSIS — D509 Iron deficiency anemia, unspecified: Secondary | ICD-10-CM | POA: Diagnosis not present

## 2012-03-27 DIAGNOSIS — Z992 Dependence on renal dialysis: Secondary | ICD-10-CM | POA: Diagnosis not present

## 2012-03-27 DIAGNOSIS — N2581 Secondary hyperparathyroidism of renal origin: Secondary | ICD-10-CM | POA: Diagnosis not present

## 2012-03-27 DIAGNOSIS — E876 Hypokalemia: Secondary | ICD-10-CM | POA: Diagnosis not present

## 2012-03-27 DIAGNOSIS — N186 End stage renal disease: Secondary | ICD-10-CM | POA: Diagnosis not present

## 2012-03-27 DIAGNOSIS — Z23 Encounter for immunization: Secondary | ICD-10-CM | POA: Diagnosis not present

## 2012-03-28 DIAGNOSIS — Z992 Dependence on renal dialysis: Secondary | ICD-10-CM | POA: Diagnosis not present

## 2012-03-28 DIAGNOSIS — N2581 Secondary hyperparathyroidism of renal origin: Secondary | ICD-10-CM | POA: Diagnosis not present

## 2012-03-28 DIAGNOSIS — Z23 Encounter for immunization: Secondary | ICD-10-CM | POA: Diagnosis not present

## 2012-03-28 DIAGNOSIS — N186 End stage renal disease: Secondary | ICD-10-CM | POA: Diagnosis not present

## 2012-03-28 DIAGNOSIS — E876 Hypokalemia: Secondary | ICD-10-CM | POA: Diagnosis not present

## 2012-03-28 DIAGNOSIS — D509 Iron deficiency anemia, unspecified: Secondary | ICD-10-CM | POA: Diagnosis not present

## 2012-03-29 DIAGNOSIS — N186 End stage renal disease: Secondary | ICD-10-CM | POA: Diagnosis not present

## 2012-03-29 DIAGNOSIS — N2581 Secondary hyperparathyroidism of renal origin: Secondary | ICD-10-CM | POA: Diagnosis not present

## 2012-03-29 DIAGNOSIS — D509 Iron deficiency anemia, unspecified: Secondary | ICD-10-CM | POA: Diagnosis not present

## 2012-03-29 DIAGNOSIS — Z23 Encounter for immunization: Secondary | ICD-10-CM | POA: Diagnosis not present

## 2012-03-29 DIAGNOSIS — E876 Hypokalemia: Secondary | ICD-10-CM | POA: Diagnosis not present

## 2012-03-29 DIAGNOSIS — Z992 Dependence on renal dialysis: Secondary | ICD-10-CM | POA: Diagnosis not present

## 2012-03-31 DIAGNOSIS — N2581 Secondary hyperparathyroidism of renal origin: Secondary | ICD-10-CM | POA: Diagnosis not present

## 2012-03-31 DIAGNOSIS — E876 Hypokalemia: Secondary | ICD-10-CM | POA: Diagnosis not present

## 2012-03-31 DIAGNOSIS — N186 End stage renal disease: Secondary | ICD-10-CM | POA: Diagnosis not present

## 2012-03-31 DIAGNOSIS — Z992 Dependence on renal dialysis: Secondary | ICD-10-CM | POA: Diagnosis not present

## 2012-03-31 DIAGNOSIS — D509 Iron deficiency anemia, unspecified: Secondary | ICD-10-CM | POA: Diagnosis not present

## 2012-03-31 DIAGNOSIS — Z23 Encounter for immunization: Secondary | ICD-10-CM | POA: Diagnosis not present

## 2012-04-03 DIAGNOSIS — N2581 Secondary hyperparathyroidism of renal origin: Secondary | ICD-10-CM | POA: Diagnosis not present

## 2012-04-03 DIAGNOSIS — Z992 Dependence on renal dialysis: Secondary | ICD-10-CM | POA: Diagnosis not present

## 2012-04-03 DIAGNOSIS — D631 Anemia in chronic kidney disease: Secondary | ICD-10-CM | POA: Diagnosis not present

## 2012-04-03 DIAGNOSIS — E8779 Other fluid overload: Secondary | ICD-10-CM | POA: Diagnosis not present

## 2012-04-03 DIAGNOSIS — E876 Hypokalemia: Secondary | ICD-10-CM | POA: Diagnosis not present

## 2012-04-03 DIAGNOSIS — N186 End stage renal disease: Secondary | ICD-10-CM | POA: Diagnosis not present

## 2012-04-03 DIAGNOSIS — D509 Iron deficiency anemia, unspecified: Secondary | ICD-10-CM | POA: Diagnosis not present

## 2012-04-05 DIAGNOSIS — D509 Iron deficiency anemia, unspecified: Secondary | ICD-10-CM | POA: Diagnosis not present

## 2012-04-05 DIAGNOSIS — Z992 Dependence on renal dialysis: Secondary | ICD-10-CM | POA: Diagnosis not present

## 2012-04-05 DIAGNOSIS — N2581 Secondary hyperparathyroidism of renal origin: Secondary | ICD-10-CM | POA: Diagnosis not present

## 2012-04-05 DIAGNOSIS — E8779 Other fluid overload: Secondary | ICD-10-CM | POA: Diagnosis not present

## 2012-04-05 DIAGNOSIS — N186 End stage renal disease: Secondary | ICD-10-CM | POA: Diagnosis not present

## 2012-04-05 DIAGNOSIS — E876 Hypokalemia: Secondary | ICD-10-CM | POA: Diagnosis not present

## 2012-04-07 DIAGNOSIS — Z992 Dependence on renal dialysis: Secondary | ICD-10-CM | POA: Diagnosis not present

## 2012-04-07 DIAGNOSIS — N2581 Secondary hyperparathyroidism of renal origin: Secondary | ICD-10-CM | POA: Diagnosis not present

## 2012-04-07 DIAGNOSIS — E876 Hypokalemia: Secondary | ICD-10-CM | POA: Diagnosis not present

## 2012-04-07 DIAGNOSIS — N186 End stage renal disease: Secondary | ICD-10-CM | POA: Diagnosis not present

## 2012-04-07 DIAGNOSIS — E8779 Other fluid overload: Secondary | ICD-10-CM | POA: Diagnosis not present

## 2012-04-07 DIAGNOSIS — D509 Iron deficiency anemia, unspecified: Secondary | ICD-10-CM | POA: Diagnosis not present

## 2012-04-11 DIAGNOSIS — D509 Iron deficiency anemia, unspecified: Secondary | ICD-10-CM | POA: Diagnosis not present

## 2012-04-11 DIAGNOSIS — E8779 Other fluid overload: Secondary | ICD-10-CM | POA: Diagnosis not present

## 2012-04-11 DIAGNOSIS — E876 Hypokalemia: Secondary | ICD-10-CM | POA: Diagnosis not present

## 2012-04-11 DIAGNOSIS — Z992 Dependence on renal dialysis: Secondary | ICD-10-CM | POA: Diagnosis not present

## 2012-04-11 DIAGNOSIS — N186 End stage renal disease: Secondary | ICD-10-CM | POA: Diagnosis not present

## 2012-04-11 DIAGNOSIS — N2581 Secondary hyperparathyroidism of renal origin: Secondary | ICD-10-CM | POA: Diagnosis not present

## 2012-04-12 ENCOUNTER — Emergency Department (HOSPITAL_COMMUNITY)
Admission: EM | Admit: 2012-04-12 | Discharge: 2012-04-12 | Disposition: A | Discharge status: Home / Self Care | Payer: Medicare Other | Attending: Emergency Medicine | Admitting: Emergency Medicine

## 2012-04-12 ENCOUNTER — Emergency Department (HOSPITAL_COMMUNITY): Payer: Medicare Other

## 2012-04-12 ENCOUNTER — Encounter (HOSPITAL_COMMUNITY): Payer: Self-pay

## 2012-04-12 DIAGNOSIS — G4730 Sleep apnea, unspecified: Secondary | ICD-10-CM | POA: Insufficient documentation

## 2012-04-12 DIAGNOSIS — F17200 Nicotine dependence, unspecified, uncomplicated: Secondary | ICD-10-CM | POA: Insufficient documentation

## 2012-04-12 DIAGNOSIS — I120 Hypertensive chronic kidney disease with stage 5 chronic kidney disease or end stage renal disease: Secondary | ICD-10-CM | POA: Insufficient documentation

## 2012-04-12 DIAGNOSIS — F329 Major depressive disorder, single episode, unspecified: Secondary | ICD-10-CM | POA: Insufficient documentation

## 2012-04-12 DIAGNOSIS — E669 Obesity, unspecified: Secondary | ICD-10-CM | POA: Insufficient documentation

## 2012-04-12 DIAGNOSIS — R0789 Other chest pain: Secondary | ICD-10-CM | POA: Insufficient documentation

## 2012-04-12 DIAGNOSIS — M109 Gout, unspecified: Secondary | ICD-10-CM | POA: Diagnosis not present

## 2012-04-12 DIAGNOSIS — N2581 Secondary hyperparathyroidism of renal origin: Secondary | ICD-10-CM | POA: Diagnosis not present

## 2012-04-12 DIAGNOSIS — J45909 Unspecified asthma, uncomplicated: Secondary | ICD-10-CM | POA: Insufficient documentation

## 2012-04-12 DIAGNOSIS — Z992 Dependence on renal dialysis: Secondary | ICD-10-CM | POA: Diagnosis not present

## 2012-04-12 DIAGNOSIS — M129 Arthropathy, unspecified: Secondary | ICD-10-CM | POA: Insufficient documentation

## 2012-04-12 DIAGNOSIS — N186 End stage renal disease: Secondary | ICD-10-CM | POA: Diagnosis not present

## 2012-04-12 DIAGNOSIS — F411 Generalized anxiety disorder: Secondary | ICD-10-CM | POA: Insufficient documentation

## 2012-04-12 DIAGNOSIS — K219 Gastro-esophageal reflux disease without esophagitis: Secondary | ICD-10-CM | POA: Insufficient documentation

## 2012-04-12 DIAGNOSIS — F988 Other specified behavioral and emotional disorders with onset usually occurring in childhood and adolescence: Secondary | ICD-10-CM | POA: Insufficient documentation

## 2012-04-12 DIAGNOSIS — E8779 Other fluid overload: Secondary | ICD-10-CM | POA: Diagnosis not present

## 2012-04-12 DIAGNOSIS — R0602 Shortness of breath: Secondary | ICD-10-CM | POA: Diagnosis not present

## 2012-04-12 DIAGNOSIS — R079 Chest pain, unspecified: Secondary | ICD-10-CM | POA: Diagnosis not present

## 2012-04-12 DIAGNOSIS — F3289 Other specified depressive episodes: Secondary | ICD-10-CM | POA: Insufficient documentation

## 2012-04-12 DIAGNOSIS — D509 Iron deficiency anemia, unspecified: Secondary | ICD-10-CM | POA: Diagnosis not present

## 2012-04-12 DIAGNOSIS — E876 Hypokalemia: Secondary | ICD-10-CM | POA: Diagnosis not present

## 2012-04-12 LAB — CBC
HCT: 33.8 % — ABNORMAL LOW (ref 36.0–46.0)
Hemoglobin: 10.9 g/dL — ABNORMAL LOW (ref 12.0–15.0)
MCH: 30.2 pg (ref 26.0–34.0)
MCHC: 32.2 g/dL (ref 30.0–36.0)
MCV: 93.6 fL (ref 78.0–100.0)
Platelets: 225 10*3/uL (ref 150–400)
RBC: 3.61 MIL/uL — ABNORMAL LOW (ref 3.87–5.11)
RDW: 14.6 % (ref 11.5–15.5)
WBC: 8.4 10*3/uL (ref 4.0–10.5)

## 2012-04-12 LAB — COMPREHENSIVE METABOLIC PANEL
ALT: 36 U/L — ABNORMAL HIGH (ref 0–35)
AST: 29 U/L (ref 0–37)
Albumin: 3.4 g/dL — ABNORMAL LOW (ref 3.5–5.2)
Alkaline Phosphatase: 86 U/L (ref 39–117)
BUN: 38 mg/dL — ABNORMAL HIGH (ref 6–23)
CO2: 27 mEq/L (ref 19–32)
Calcium: 9.4 mg/dL (ref 8.4–10.5)
Chloride: 96 mEq/L (ref 96–112)
Creatinine, Ser: 4.78 mg/dL — ABNORMAL HIGH (ref 0.50–1.10)
GFR calc Af Amer: 12 mL/min — ABNORMAL LOW (ref >90)
GFR calc non Af Amer: 11 mL/min — ABNORMAL LOW (ref >90)
Glucose, Bld: 118 mg/dL — ABNORMAL HIGH (ref 70–99)
Potassium: 3.3 mEq/L — ABNORMAL LOW (ref 3.5–5.1)
Sodium: 139 mEq/L (ref 135–145)
Total Bilirubin: 0.2 mg/dL — ABNORMAL LOW (ref 0.3–1.2)
Total Protein: 6.7 g/dL (ref 6.0–8.3)

## 2012-04-12 LAB — CARDIAC PANEL(CRET KIN+CKTOT+MB+TROPI)
CK, MB: 1.5 ng/mL (ref 0.3–4.0)
Total CK: 190 U/L — ABNORMAL HIGH (ref 7–177)

## 2012-04-12 LAB — DIFFERENTIAL
Eosinophils Relative: 0 % (ref 0–5)
Lymphocytes Relative: 34 % (ref 12–46)
Lymphs Abs: 2.8 10*3/uL (ref 0.7–4.0)
Monocytes Absolute: 0.4 10*3/uL (ref 0.1–1.0)
Monocytes Relative: 5 % (ref 3–12)
Neutro Abs: 5.1 10*3/uL (ref 1.7–7.7)

## 2012-04-12 LAB — PROTIME-INR: Prothrombin Time: 13.1 seconds (ref 11.6–15.2)

## 2012-04-12 MED ORDER — FAMOTIDINE 40 MG PO TABS: 40.0000 mg | ORAL_TABLET | Freq: Every day | ORAL | Status: DC

## 2012-04-12 MED ORDER — GI COCKTAIL ~~LOC~~
30.0000 mL | Freq: Once | ORAL | Status: AC
  Administered 2012-04-12: 30 mL via ORAL
  Filled 2012-04-12: qty 30

## 2012-04-12 NOTE — ED Notes (Signed)
IV team returned call and requested that this RN call hemodialysis to see if they could de-access the port.  Hemodialysis staff states this RN should call IV team to de-access.  IV team paged and they will be here shortly.

## 2012-04-12 NOTE — ED Notes (Signed)
Pt has dialysis MWF.  Pt had dialysis yesterday, Tuesday, due to being on vacation Monday.  Pt in for dialysis today, 40 min into treatment began having CP.  Pt states she has been told from her MD NOT to take ASA.  Pt unsure of reason.

## 2012-04-12 NOTE — ED Notes (Signed)
NAD noted at time of d/c home. Pt and family verbalized understanding of d/c inst.

## 2012-04-12 NOTE — ED Notes (Signed)
Paged IV team to de-access dialysis port.

## 2012-04-12 NOTE — ED Provider Notes (Addendum)
History     CSN: [id number]  Arrival date & time 04/12/12  [id number]   First MD Initiated Contact with Patient 04/12/12 [phone]      Chief Complaint  Patient presents with  . Chest Pain    (Consider location/radiation/quality/duration/timing/severity/associated sxs/prior treatment) Patient is a 41 y.o. female presenting with chest pain. The history is provided by the patient.  Chest Pain The chest pain began yesterday (2 episodes since yesterday). Episode Length: episode yesterday lasted 4 hours and today has been ongoing for 40 min. Chest pain occurs intermittently. The chest pain is unchanged. At its most intense, the pain is at 8/10. The pain is currently at 6/10. The severity of the pain is moderate. The quality of the pain is described as burning, heavy and pleuritic. The pain does not radiate. Chest pain is worsened by deep breathing and certain positions (hurts with palpating the chest). Primary symptoms include shortness of breath, cough, nausea and dizziness. Pertinent negatives for primary symptoms include no fever, no syncope, no wheezing, no palpitations, no abdominal pain and no vomiting.  Dizziness also occurs with nausea and weakness. Dizziness does not occur with vomiting or diaphoresis.   Associated symptoms include weakness.  Pertinent negatives for associated symptoms include no diaphoresis and no near-syncope. She tried nitroglycerin for the symptoms.  Her past medical history is significant for hypertension.  Her family medical history is significant for early MI in family.  Procedure history is negative for cardiac catheterization and echocardiogram.     Past Medical History  Diagnosis Date  . Anemia   . Depression   . Reflux   . Dizziness   . Anxiety   . ADD (attention deficit disorder)   . Asthma   . Headache   . Gout   . Anemia   . GERD (gastroesophageal reflux disease)   . Obesity   . Gastritis   . Blood transfusion 06/2011    6 units transfused at [facility]  . Arthritis     knee, wrist, back   . Bronchitis   . Shortness of breath   . Sleep apnea     does not use CPAP  . Neuromuscular disorder     carpal tunnel  . Chronic kidney disease     Atrophic left kidney, sees Dr. Hassell Done  . Hypertension     sees Dr. Wendie Agreste    Past Surgical History  Procedure Date  . Cesarean section   . Av fistula placement 06/10/11    Left brachiocephalic AVF  . Spine surgery 2004    Lumbar diskectomy   . Microdiskectomy 04/2001    Herniated disk L5-S1  . Renal biopsy 04/2004  . Wisdom tooth extraction   . Carpel tunnel release     x 2; bilateral  . Upper gastrointestinal endoscopy   . Colonscopy   . Dilation and curettage of uterus   . Av fistula surgery 11/30/11    [facility] - redo fistula  . Hysteroscopy with thermachoice 12/10/2011    Procedure: HYSTEROSCOPY WITH THERMACHOICE;  Surgeon: Betsy Coder, MD;  Location: [facility];  Service: Gynecology;  Laterality: N/A;    Family History  Problem Relation Age of Onset  . Hypertension Mother   . Heart disease Father     Heart Disease before age 29  . Hypertension Father   . Heart attack Father   . Hypertension Daughter     History  Substance Use Topics  . Smoking status: Current Everyday Smoker -- 0.5  packs/day for 10 years    Types: Cigarettes  . Smokeless tobacco: Never Used  . Alcohol Use: Yes     ocassional    OB History    Grav Para Term Preterm Abortions TAB SAB Ect Mult Living                  Review of Systems  Constitutional: Negative for fever and diaphoresis.  Respiratory: Positive for cough and shortness of breath. Negative for wheezing.   Cardiovascular: Positive for chest pain. Negative for palpitations, syncope and near-syncope.  Gastrointestinal: Positive for nausea. Negative for vomiting and abdominal pain.  Neurological: Positive for dizziness and weakness.  All other systems reviewed and are negative.    Allergies  Clarithromycin; Demerol; Fentanyl;  Morphine and related; Nsaids; and Other  Home Medications   Current Outpatient Rx  Name Route Sig Dispense Refill  . ACETAMINOPHEN 500 MG PO TABS Oral Take 1,000 mg by mouth daily as needed. For headache    . ALBUTEROL SULFATE HFA 108 (90 BASE) MCG/ACT IN AERS Inhalation Inhale 2 puffs into the lungs every 6 (six) hours as needed. For shortness of breath     . ALLOPURINOL 300 MG PO TABS Oral Take 300 mg by mouth daily.     Marland Kitchen ALPRAZOLAM 0.5 MG PO TABS Oral Take 0.5 mg by mouth 3 (three) times daily.     Marland Kitchen CALCIUM ACETATE 667 MG PO CAPS Oral Take 667 mg by mouth 3 (three) times daily with meals.    Marland Kitchen CITALOPRAM HYDROBROMIDE 20 MG PO TABS Oral Take 40 mg by mouth daily.     Marland Kitchen ESOMEPRAZOLE MAGNESIUM 40 MG PO CPDR Oral Take 40 mg by mouth daily.     Marland Kitchen FLUTICASONE-SALMETEROL 250-50 MCG/DOSE IN AEPB Inhalation Inhale 1 puff into the lungs every 12 (twelve) hours.      . FUROSEMIDE 80 MG PO TABS Oral Take 80-160 mg by mouth 2 (two) times daily. Takes 160mg  every morning and 80mg  every afternoon    . LIDOCAINE-PRILOCAINE 2.5-2.5 % EX CREA Topical Apply 1 application topically every Monday, Wednesday, and Friday.    Marland Kitchen MONTELUKAST SODIUM 10 MG PO TABS Oral Take 10 mg by mouth every morning.     Marland Kitchen RENA-VITE PO TABS Oral Take 1 tablet by mouth daily.    . NORETHINDRONE ACETATE 5 MG PO TABS Oral Take 10 mg by mouth daily.     . OXYCODONE HCL ER 10 MG PO TB12 Oral Take 10 mg by mouth every 12 (twelve) hours.      Marland Kitchen PERPHENAZINE 4 MG PO TABS Oral Take 4-8 mg by mouth at bedtime.    Marland Kitchen PROMETHAZINE HCL 25 MG PO TABS Oral Take 25 mg by mouth every 6 (six) hours as needed. For nausea      BP 111/76  Temp 98.3 F (36.8 C) (Oral)  Resp 20  SpO2 97%  Physical Exam  Nursing note and vitals reviewed. Constitutional: She is oriented to person, place, and time. She appears well-developed and well-nourished. No distress.       Morbid obesity  HENT:  Head: Normocephalic and atraumatic.  Mouth/Throat:  Oropharynx is clear and moist.  Eyes: Conjunctivae and EOM are normal. Pupils are equal, round, and reactive to light.  Neck: Normal range of motion. Neck supple.  Cardiovascular: Normal rate, regular rhythm and intact distal pulses.   No murmur heard. Pulmonary/Chest: Effort normal and breath sounds normal. No respiratory distress. She has no wheezes. She has no  rales. She exhibits tenderness.    Abdominal: Soft. She exhibits no distension. There is tenderness in the epigastric area. There is no rebound and no guarding.       Mild epigastric tenderness  Musculoskeletal: Normal range of motion. She exhibits no edema and no tenderness.       Left arm graft still accessed with good thrill  Neurological: She is alert and oriented to person, place, and time.  Skin: Skin is warm and dry. No rash noted. No erythema.  Psychiatric: She has a normal mood and affect. Her behavior is normal.    ED Course  Procedures (including critical care time)  Labs Reviewed  CBC - Abnormal; Notable for the following:    RBC 3.61 (*)     Hemoglobin 10.9 (*)     HCT 33.8 (*)     All other components within normal limits  COMPREHENSIVE METABOLIC PANEL - Abnormal; Notable for the following:    Potassium 3.3 (*)     Glucose, Bld 118 (*)     BUN 38 (*)     Creatinine, Ser 4.78 (*)     Albumin 3.4 (*)     ALT 36 (*)     Total Bilirubin 0.2 (*)     GFR calc non Af Amer 11 (*)     GFR calc Af Amer 12 (*)     All other components within normal limits  LIPASE, BLOOD - Abnormal; Notable for the following:    Lipase 70 (*)     All other components within normal limits  CARDIAC PANEL(CRET KIN+CKTOT+MB+TROPI) - Abnormal; Notable for the following:    Total CK 190 (*)     All other components within normal limits  DIFFERENTIAL  APTT  PROTIME-INR   Dg Chest 2 View  04/12/2012  *RADIOLOGY REPORT*  Clinical Data: Left chest pain  CHEST - 2 VIEW  Comparison: 10/18/2011  Findings: Lungs are clear. No pleural  effusion or pneumothorax.  The heart is top normal in size.  Mild degenerative changes of the visualized thoracolumbar spine.  IMPRESSION: No evidence of acute cardiopulmonary disease.  Original Report Authenticated By: Julian Hy, M.D.     Date: 04/12/2012  Rate: 72  Rhythm: normal sinus rhythm  QRS Axis: normal  Intervals: normal  ST/T Wave abnormalities: normal  Conduction Disutrbances: none  Narrative Interpretation: unremarkable     1. GERD (gastroesophageal reflux disease)   2. Atypical chest pain       MDM   Patient is a dialysis patient who missed dialysis on Monday to receive dialysis yesterday and today. Last night she developed substernal/breast chest pain  when she was lying down last night that lasted for 4 hours and resolved. While she was at dialysis today the pain reoccurred. She states that she had nausea this morning and some mild shortness of breath. Patient has no prior cardiac issues but does have a history of anemia and GERD.  And chest pain is reproducible with deep breathing and palpation. She also has mild epigastric tenderness. Feel that this may be GI in origin versus cardiac.  PERC neg.  Will try a GI cocktail to see if that improves her pain. CBC, CMP, lipase, cardiac enzymes, coags, chest x-ray are pending. EKG was within normal limits  10:23 AM After GI cocktail all pain has resolved.  Pt states she has been on vacation and eating food that is different for her.  Feel most likely that this is GI related.  Enzymes  neg and rest of labs consistent with dialysis but normal K. Will started on prevacid and have her f/u with [facility].    Blanchie Dessert, MD 04/12/12 1049  Blanchie Dessert, MD 04/12/12 [place], MD 04/12/12 1128

## 2012-04-12 NOTE — ED Notes (Signed)
AV Fistula is currently open and accessed.  Dialysis center did not de-access due to possibility that pt would receive remainder of treatment here.

## 2012-04-12 NOTE — ED Notes (Signed)
PER EMS: patient reporting intermittent chest pain since yesterday, dialysis patient (MWF) missed dialysis Monday, was at dialysis today developed sudden onset substernal chest pain approx 40 min into session.  Patient had 3 SL nitro by EMS, pain unrelieved or decreased, pain currently 6/10, patient reports she cannot take nitro.

## 2012-04-14 DIAGNOSIS — Z992 Dependence on renal dialysis: Secondary | ICD-10-CM | POA: Diagnosis not present

## 2012-04-14 DIAGNOSIS — D509 Iron deficiency anemia, unspecified: Secondary | ICD-10-CM | POA: Diagnosis not present

## 2012-04-14 DIAGNOSIS — E8779 Other fluid overload: Secondary | ICD-10-CM | POA: Diagnosis not present

## 2012-04-14 DIAGNOSIS — N2581 Secondary hyperparathyroidism of renal origin: Secondary | ICD-10-CM | POA: Diagnosis not present

## 2012-04-14 DIAGNOSIS — N186 End stage renal disease: Secondary | ICD-10-CM | POA: Diagnosis not present

## 2012-04-14 DIAGNOSIS — E876 Hypokalemia: Secondary | ICD-10-CM | POA: Diagnosis not present

## 2012-04-15 DIAGNOSIS — N186 End stage renal disease: Secondary | ICD-10-CM | POA: Diagnosis not present

## 2012-04-15 DIAGNOSIS — E8779 Other fluid overload: Secondary | ICD-10-CM | POA: Diagnosis not present

## 2012-04-15 DIAGNOSIS — N2581 Secondary hyperparathyroidism of renal origin: Secondary | ICD-10-CM | POA: Diagnosis not present

## 2012-04-15 DIAGNOSIS — Z992 Dependence on renal dialysis: Secondary | ICD-10-CM | POA: Diagnosis not present

## 2012-04-15 DIAGNOSIS — E876 Hypokalemia: Secondary | ICD-10-CM | POA: Diagnosis not present

## 2012-04-15 DIAGNOSIS — D509 Iron deficiency anemia, unspecified: Secondary | ICD-10-CM | POA: Diagnosis not present

## 2012-04-17 DIAGNOSIS — N2581 Secondary hyperparathyroidism of renal origin: Secondary | ICD-10-CM | POA: Diagnosis not present

## 2012-04-17 DIAGNOSIS — E8779 Other fluid overload: Secondary | ICD-10-CM | POA: Diagnosis not present

## 2012-04-17 DIAGNOSIS — E876 Hypokalemia: Secondary | ICD-10-CM | POA: Diagnosis not present

## 2012-04-17 DIAGNOSIS — N186 End stage renal disease: Secondary | ICD-10-CM | POA: Diagnosis not present

## 2012-04-17 DIAGNOSIS — Z992 Dependence on renal dialysis: Secondary | ICD-10-CM | POA: Diagnosis not present

## 2012-04-17 DIAGNOSIS — D509 Iron deficiency anemia, unspecified: Secondary | ICD-10-CM | POA: Diagnosis not present

## 2012-04-19 DIAGNOSIS — N2581 Secondary hyperparathyroidism of renal origin: Secondary | ICD-10-CM | POA: Diagnosis not present

## 2012-04-19 DIAGNOSIS — G43119 Migraine with aura, intractable, without status migrainosus: Secondary | ICD-10-CM | POA: Diagnosis not present

## 2012-04-19 DIAGNOSIS — E8779 Other fluid overload: Secondary | ICD-10-CM | POA: Diagnosis not present

## 2012-04-19 DIAGNOSIS — N186 End stage renal disease: Secondary | ICD-10-CM | POA: Diagnosis not present

## 2012-04-19 DIAGNOSIS — J019 Acute sinusitis, unspecified: Secondary | ICD-10-CM | POA: Diagnosis not present

## 2012-04-19 DIAGNOSIS — Z992 Dependence on renal dialysis: Secondary | ICD-10-CM | POA: Diagnosis not present

## 2012-04-19 DIAGNOSIS — D509 Iron deficiency anemia, unspecified: Secondary | ICD-10-CM | POA: Diagnosis not present

## 2012-04-19 DIAGNOSIS — R110 Nausea: Secondary | ICD-10-CM | POA: Diagnosis not present

## 2012-04-19 DIAGNOSIS — E876 Hypokalemia: Secondary | ICD-10-CM | POA: Diagnosis not present

## 2012-04-21 DIAGNOSIS — N2581 Secondary hyperparathyroidism of renal origin: Secondary | ICD-10-CM | POA: Diagnosis not present

## 2012-04-21 DIAGNOSIS — Z992 Dependence on renal dialysis: Secondary | ICD-10-CM | POA: Diagnosis not present

## 2012-04-21 DIAGNOSIS — E8779 Other fluid overload: Secondary | ICD-10-CM | POA: Diagnosis not present

## 2012-04-21 DIAGNOSIS — E876 Hypokalemia: Secondary | ICD-10-CM | POA: Diagnosis not present

## 2012-04-21 DIAGNOSIS — D509 Iron deficiency anemia, unspecified: Secondary | ICD-10-CM | POA: Diagnosis not present

## 2012-04-21 DIAGNOSIS — N186 End stage renal disease: Secondary | ICD-10-CM | POA: Diagnosis not present

## 2012-04-24 DIAGNOSIS — D509 Iron deficiency anemia, unspecified: Secondary | ICD-10-CM | POA: Diagnosis not present

## 2012-04-24 DIAGNOSIS — N2581 Secondary hyperparathyroidism of renal origin: Secondary | ICD-10-CM | POA: Diagnosis not present

## 2012-04-24 DIAGNOSIS — Z992 Dependence on renal dialysis: Secondary | ICD-10-CM | POA: Diagnosis not present

## 2012-04-24 DIAGNOSIS — E8779 Other fluid overload: Secondary | ICD-10-CM | POA: Diagnosis not present

## 2012-04-24 DIAGNOSIS — N186 End stage renal disease: Secondary | ICD-10-CM | POA: Diagnosis not present

## 2012-04-24 DIAGNOSIS — E876 Hypokalemia: Secondary | ICD-10-CM | POA: Diagnosis not present

## 2012-04-26 DIAGNOSIS — E8779 Other fluid overload: Secondary | ICD-10-CM | POA: Diagnosis not present

## 2012-04-26 DIAGNOSIS — Z992 Dependence on renal dialysis: Secondary | ICD-10-CM | POA: Diagnosis not present

## 2012-04-26 DIAGNOSIS — N186 End stage renal disease: Secondary | ICD-10-CM | POA: Diagnosis not present

## 2012-04-26 DIAGNOSIS — N2581 Secondary hyperparathyroidism of renal origin: Secondary | ICD-10-CM | POA: Diagnosis not present

## 2012-04-26 DIAGNOSIS — E876 Hypokalemia: Secondary | ICD-10-CM | POA: Diagnosis not present

## 2012-04-26 DIAGNOSIS — D509 Iron deficiency anemia, unspecified: Secondary | ICD-10-CM | POA: Diagnosis not present

## 2012-04-28 DIAGNOSIS — D509 Iron deficiency anemia, unspecified: Secondary | ICD-10-CM | POA: Diagnosis not present

## 2012-04-28 DIAGNOSIS — E876 Hypokalemia: Secondary | ICD-10-CM | POA: Diagnosis not present

## 2012-04-28 DIAGNOSIS — N2581 Secondary hyperparathyroidism of renal origin: Secondary | ICD-10-CM | POA: Diagnosis not present

## 2012-04-28 DIAGNOSIS — N186 End stage renal disease: Secondary | ICD-10-CM | POA: Diagnosis not present

## 2012-04-28 DIAGNOSIS — Z992 Dependence on renal dialysis: Secondary | ICD-10-CM | POA: Diagnosis not present

## 2012-04-28 DIAGNOSIS — E8779 Other fluid overload: Secondary | ICD-10-CM | POA: Diagnosis not present

## 2012-04-30 DIAGNOSIS — N186 End stage renal disease: Secondary | ICD-10-CM | POA: Diagnosis not present

## 2012-05-01 DIAGNOSIS — E876 Hypokalemia: Secondary | ICD-10-CM | POA: Diagnosis not present

## 2012-05-01 DIAGNOSIS — E8779 Other fluid overload: Secondary | ICD-10-CM | POA: Diagnosis not present

## 2012-05-01 DIAGNOSIS — N039 Chronic nephritic syndrome with unspecified morphologic changes: Secondary | ICD-10-CM | POA: Diagnosis not present

## 2012-05-01 DIAGNOSIS — Z992 Dependence on renal dialysis: Secondary | ICD-10-CM | POA: Diagnosis not present

## 2012-05-01 DIAGNOSIS — N2581 Secondary hyperparathyroidism of renal origin: Secondary | ICD-10-CM | POA: Diagnosis not present

## 2012-05-01 DIAGNOSIS — D509 Iron deficiency anemia, unspecified: Secondary | ICD-10-CM | POA: Diagnosis not present

## 2012-05-01 DIAGNOSIS — N186 End stage renal disease: Secondary | ICD-10-CM | POA: Diagnosis not present

## 2012-05-02 DIAGNOSIS — IMO0002 Reserved for concepts with insufficient information to code with codable children: Secondary | ICD-10-CM | POA: Diagnosis not present

## 2012-05-02 DIAGNOSIS — M48061 Spinal stenosis, lumbar region without neurogenic claudication: Secondary | ICD-10-CM | POA: Diagnosis not present

## 2012-05-03 DIAGNOSIS — N186 End stage renal disease: Secondary | ICD-10-CM | POA: Diagnosis not present

## 2012-05-03 DIAGNOSIS — N2581 Secondary hyperparathyroidism of renal origin: Secondary | ICD-10-CM | POA: Diagnosis not present

## 2012-05-03 DIAGNOSIS — D509 Iron deficiency anemia, unspecified: Secondary | ICD-10-CM | POA: Diagnosis not present

## 2012-05-03 DIAGNOSIS — E8779 Other fluid overload: Secondary | ICD-10-CM | POA: Diagnosis not present

## 2012-05-03 DIAGNOSIS — Z992 Dependence on renal dialysis: Secondary | ICD-10-CM | POA: Diagnosis not present

## 2012-05-03 DIAGNOSIS — E876 Hypokalemia: Secondary | ICD-10-CM | POA: Diagnosis not present

## 2012-05-06 DIAGNOSIS — N186 End stage renal disease: Secondary | ICD-10-CM | POA: Diagnosis not present

## 2012-05-06 DIAGNOSIS — E8779 Other fluid overload: Secondary | ICD-10-CM | POA: Diagnosis not present

## 2012-05-06 DIAGNOSIS — D509 Iron deficiency anemia, unspecified: Secondary | ICD-10-CM | POA: Diagnosis not present

## 2012-05-06 DIAGNOSIS — N2581 Secondary hyperparathyroidism of renal origin: Secondary | ICD-10-CM | POA: Diagnosis not present

## 2012-05-06 DIAGNOSIS — E876 Hypokalemia: Secondary | ICD-10-CM | POA: Diagnosis not present

## 2012-05-06 DIAGNOSIS — Z992 Dependence on renal dialysis: Secondary | ICD-10-CM | POA: Diagnosis not present

## 2012-05-08 DIAGNOSIS — E8779 Other fluid overload: Secondary | ICD-10-CM | POA: Diagnosis not present

## 2012-05-08 DIAGNOSIS — N186 End stage renal disease: Secondary | ICD-10-CM | POA: Diagnosis not present

## 2012-05-08 DIAGNOSIS — Z992 Dependence on renal dialysis: Secondary | ICD-10-CM | POA: Diagnosis not present

## 2012-05-08 DIAGNOSIS — D509 Iron deficiency anemia, unspecified: Secondary | ICD-10-CM | POA: Diagnosis not present

## 2012-05-08 DIAGNOSIS — E876 Hypokalemia: Secondary | ICD-10-CM | POA: Diagnosis not present

## 2012-05-08 DIAGNOSIS — N2581 Secondary hyperparathyroidism of renal origin: Secondary | ICD-10-CM | POA: Diagnosis not present

## 2012-05-10 DIAGNOSIS — E876 Hypokalemia: Secondary | ICD-10-CM | POA: Diagnosis not present

## 2012-05-10 DIAGNOSIS — N186 End stage renal disease: Secondary | ICD-10-CM | POA: Diagnosis not present

## 2012-05-10 DIAGNOSIS — D509 Iron deficiency anemia, unspecified: Secondary | ICD-10-CM | POA: Diagnosis not present

## 2012-05-10 DIAGNOSIS — N2581 Secondary hyperparathyroidism of renal origin: Secondary | ICD-10-CM | POA: Diagnosis not present

## 2012-05-10 DIAGNOSIS — E8779 Other fluid overload: Secondary | ICD-10-CM | POA: Diagnosis not present

## 2012-05-10 DIAGNOSIS — Z992 Dependence on renal dialysis: Secondary | ICD-10-CM | POA: Diagnosis not present

## 2012-05-12 DIAGNOSIS — E8779 Other fluid overload: Secondary | ICD-10-CM | POA: Diagnosis not present

## 2012-05-12 DIAGNOSIS — N186 End stage renal disease: Secondary | ICD-10-CM | POA: Diagnosis not present

## 2012-05-12 DIAGNOSIS — N2581 Secondary hyperparathyroidism of renal origin: Secondary | ICD-10-CM | POA: Diagnosis not present

## 2012-05-12 DIAGNOSIS — D509 Iron deficiency anemia, unspecified: Secondary | ICD-10-CM | POA: Diagnosis not present

## 2012-05-12 DIAGNOSIS — Z992 Dependence on renal dialysis: Secondary | ICD-10-CM | POA: Diagnosis not present

## 2012-05-12 DIAGNOSIS — E876 Hypokalemia: Secondary | ICD-10-CM | POA: Diagnosis not present

## 2012-05-15 DIAGNOSIS — N186 End stage renal disease: Secondary | ICD-10-CM | POA: Diagnosis not present

## 2012-05-15 DIAGNOSIS — N2581 Secondary hyperparathyroidism of renal origin: Secondary | ICD-10-CM | POA: Diagnosis not present

## 2012-05-15 DIAGNOSIS — Z992 Dependence on renal dialysis: Secondary | ICD-10-CM | POA: Diagnosis not present

## 2012-05-15 DIAGNOSIS — E876 Hypokalemia: Secondary | ICD-10-CM | POA: Diagnosis not present

## 2012-05-15 DIAGNOSIS — D509 Iron deficiency anemia, unspecified: Secondary | ICD-10-CM | POA: Diagnosis not present

## 2012-05-15 DIAGNOSIS — E8779 Other fluid overload: Secondary | ICD-10-CM | POA: Diagnosis not present

## 2012-05-17 DIAGNOSIS — E876 Hypokalemia: Secondary | ICD-10-CM | POA: Diagnosis not present

## 2012-05-17 DIAGNOSIS — D509 Iron deficiency anemia, unspecified: Secondary | ICD-10-CM | POA: Diagnosis not present

## 2012-05-17 DIAGNOSIS — N2581 Secondary hyperparathyroidism of renal origin: Secondary | ICD-10-CM | POA: Diagnosis not present

## 2012-05-17 DIAGNOSIS — E8779 Other fluid overload: Secondary | ICD-10-CM | POA: Diagnosis not present

## 2012-05-17 DIAGNOSIS — Z992 Dependence on renal dialysis: Secondary | ICD-10-CM | POA: Diagnosis not present

## 2012-05-17 DIAGNOSIS — N186 End stage renal disease: Secondary | ICD-10-CM | POA: Diagnosis not present

## 2012-05-19 DIAGNOSIS — E8779 Other fluid overload: Secondary | ICD-10-CM | POA: Diagnosis not present

## 2012-05-19 DIAGNOSIS — N2581 Secondary hyperparathyroidism of renal origin: Secondary | ICD-10-CM | POA: Diagnosis not present

## 2012-05-19 DIAGNOSIS — D509 Iron deficiency anemia, unspecified: Secondary | ICD-10-CM | POA: Diagnosis not present

## 2012-05-19 DIAGNOSIS — E876 Hypokalemia: Secondary | ICD-10-CM | POA: Diagnosis not present

## 2012-05-19 DIAGNOSIS — N186 End stage renal disease: Secondary | ICD-10-CM | POA: Diagnosis not present

## 2012-05-19 DIAGNOSIS — Z992 Dependence on renal dialysis: Secondary | ICD-10-CM | POA: Diagnosis not present

## 2012-05-22 DIAGNOSIS — N186 End stage renal disease: Secondary | ICD-10-CM | POA: Diagnosis not present

## 2012-05-22 DIAGNOSIS — E8779 Other fluid overload: Secondary | ICD-10-CM | POA: Diagnosis not present

## 2012-05-22 DIAGNOSIS — D509 Iron deficiency anemia, unspecified: Secondary | ICD-10-CM | POA: Diagnosis not present

## 2012-05-22 DIAGNOSIS — N2581 Secondary hyperparathyroidism of renal origin: Secondary | ICD-10-CM | POA: Diagnosis not present

## 2012-05-22 DIAGNOSIS — Z992 Dependence on renal dialysis: Secondary | ICD-10-CM | POA: Diagnosis not present

## 2012-05-22 DIAGNOSIS — E876 Hypokalemia: Secondary | ICD-10-CM | POA: Diagnosis not present

## 2012-05-24 DIAGNOSIS — N186 End stage renal disease: Secondary | ICD-10-CM | POA: Diagnosis not present

## 2012-05-24 DIAGNOSIS — D509 Iron deficiency anemia, unspecified: Secondary | ICD-10-CM | POA: Diagnosis not present

## 2012-05-24 DIAGNOSIS — Z992 Dependence on renal dialysis: Secondary | ICD-10-CM | POA: Diagnosis not present

## 2012-05-24 DIAGNOSIS — N2581 Secondary hyperparathyroidism of renal origin: Secondary | ICD-10-CM | POA: Diagnosis not present

## 2012-05-24 DIAGNOSIS — E8779 Other fluid overload: Secondary | ICD-10-CM | POA: Diagnosis not present

## 2012-05-24 DIAGNOSIS — E876 Hypokalemia: Secondary | ICD-10-CM | POA: Diagnosis not present

## 2012-05-26 DIAGNOSIS — D509 Iron deficiency anemia, unspecified: Secondary | ICD-10-CM | POA: Diagnosis not present

## 2012-05-26 DIAGNOSIS — E8779 Other fluid overload: Secondary | ICD-10-CM | POA: Diagnosis not present

## 2012-05-26 DIAGNOSIS — N186 End stage renal disease: Secondary | ICD-10-CM | POA: Diagnosis not present

## 2012-05-26 DIAGNOSIS — E876 Hypokalemia: Secondary | ICD-10-CM | POA: Diagnosis not present

## 2012-05-26 DIAGNOSIS — N2581 Secondary hyperparathyroidism of renal origin: Secondary | ICD-10-CM | POA: Diagnosis not present

## 2012-05-26 DIAGNOSIS — Z992 Dependence on renal dialysis: Secondary | ICD-10-CM | POA: Diagnosis not present

## 2012-05-29 DIAGNOSIS — Z992 Dependence on renal dialysis: Secondary | ICD-10-CM | POA: Diagnosis not present

## 2012-05-29 DIAGNOSIS — N186 End stage renal disease: Secondary | ICD-10-CM | POA: Diagnosis not present

## 2012-05-29 DIAGNOSIS — E876 Hypokalemia: Secondary | ICD-10-CM | POA: Diagnosis not present

## 2012-05-29 DIAGNOSIS — D509 Iron deficiency anemia, unspecified: Secondary | ICD-10-CM | POA: Diagnosis not present

## 2012-05-29 DIAGNOSIS — N2581 Secondary hyperparathyroidism of renal origin: Secondary | ICD-10-CM | POA: Diagnosis not present

## 2012-05-29 DIAGNOSIS — E8779 Other fluid overload: Secondary | ICD-10-CM | POA: Diagnosis not present

## 2012-05-30 DIAGNOSIS — D509 Iron deficiency anemia, unspecified: Secondary | ICD-10-CM | POA: Diagnosis not present

## 2012-05-30 DIAGNOSIS — N2581 Secondary hyperparathyroidism of renal origin: Secondary | ICD-10-CM | POA: Diagnosis not present

## 2012-05-30 DIAGNOSIS — Z992 Dependence on renal dialysis: Secondary | ICD-10-CM | POA: Diagnosis not present

## 2012-05-30 DIAGNOSIS — E8779 Other fluid overload: Secondary | ICD-10-CM | POA: Diagnosis not present

## 2012-05-30 DIAGNOSIS — N186 End stage renal disease: Secondary | ICD-10-CM | POA: Diagnosis not present

## 2012-05-30 DIAGNOSIS — E876 Hypokalemia: Secondary | ICD-10-CM | POA: Diagnosis not present

## 2012-05-31 DIAGNOSIS — N186 End stage renal disease: Secondary | ICD-10-CM | POA: Diagnosis not present

## 2012-05-31 DIAGNOSIS — N2581 Secondary hyperparathyroidism of renal origin: Secondary | ICD-10-CM | POA: Diagnosis not present

## 2012-05-31 DIAGNOSIS — E876 Hypokalemia: Secondary | ICD-10-CM | POA: Diagnosis not present

## 2012-05-31 DIAGNOSIS — Z992 Dependence on renal dialysis: Secondary | ICD-10-CM | POA: Diagnosis not present

## 2012-05-31 DIAGNOSIS — D509 Iron deficiency anemia, unspecified: Secondary | ICD-10-CM | POA: Diagnosis not present

## 2012-05-31 DIAGNOSIS — E8779 Other fluid overload: Secondary | ICD-10-CM | POA: Diagnosis not present

## 2012-06-02 DIAGNOSIS — Z992 Dependence on renal dialysis: Secondary | ICD-10-CM | POA: Diagnosis not present

## 2012-06-02 DIAGNOSIS — D631 Anemia in chronic kidney disease: Secondary | ICD-10-CM | POA: Diagnosis not present

## 2012-06-02 DIAGNOSIS — N186 End stage renal disease: Secondary | ICD-10-CM | POA: Diagnosis not present

## 2012-06-02 DIAGNOSIS — D509 Iron deficiency anemia, unspecified: Secondary | ICD-10-CM | POA: Diagnosis not present

## 2012-06-02 DIAGNOSIS — N2581 Secondary hyperparathyroidism of renal origin: Secondary | ICD-10-CM | POA: Diagnosis not present

## 2012-06-02 DIAGNOSIS — N039 Chronic nephritic syndrome with unspecified morphologic changes: Secondary | ICD-10-CM | POA: Diagnosis not present

## 2012-06-02 DIAGNOSIS — E876 Hypokalemia: Secondary | ICD-10-CM | POA: Diagnosis not present

## 2012-06-05 DIAGNOSIS — D509 Iron deficiency anemia, unspecified: Secondary | ICD-10-CM | POA: Diagnosis not present

## 2012-06-05 DIAGNOSIS — Z992 Dependence on renal dialysis: Secondary | ICD-10-CM | POA: Diagnosis not present

## 2012-06-05 DIAGNOSIS — N186 End stage renal disease: Secondary | ICD-10-CM | POA: Diagnosis not present

## 2012-06-05 DIAGNOSIS — N2581 Secondary hyperparathyroidism of renal origin: Secondary | ICD-10-CM | POA: Diagnosis not present

## 2012-06-05 DIAGNOSIS — D631 Anemia in chronic kidney disease: Secondary | ICD-10-CM | POA: Diagnosis not present

## 2012-06-05 DIAGNOSIS — E876 Hypokalemia: Secondary | ICD-10-CM | POA: Diagnosis not present

## 2012-06-06 DIAGNOSIS — D509 Iron deficiency anemia, unspecified: Secondary | ICD-10-CM | POA: Diagnosis not present

## 2012-06-06 DIAGNOSIS — D631 Anemia in chronic kidney disease: Secondary | ICD-10-CM | POA: Diagnosis not present

## 2012-06-06 DIAGNOSIS — N186 End stage renal disease: Secondary | ICD-10-CM | POA: Diagnosis not present

## 2012-06-06 DIAGNOSIS — N2581 Secondary hyperparathyroidism of renal origin: Secondary | ICD-10-CM | POA: Diagnosis not present

## 2012-06-06 DIAGNOSIS — E876 Hypokalemia: Secondary | ICD-10-CM | POA: Diagnosis not present

## 2012-06-06 DIAGNOSIS — Z992 Dependence on renal dialysis: Secondary | ICD-10-CM | POA: Diagnosis not present

## 2012-06-07 DIAGNOSIS — N2581 Secondary hyperparathyroidism of renal origin: Secondary | ICD-10-CM | POA: Diagnosis not present

## 2012-06-07 DIAGNOSIS — D631 Anemia in chronic kidney disease: Secondary | ICD-10-CM | POA: Diagnosis not present

## 2012-06-07 DIAGNOSIS — N186 End stage renal disease: Secondary | ICD-10-CM | POA: Diagnosis not present

## 2012-06-07 DIAGNOSIS — N039 Chronic nephritic syndrome with unspecified morphologic changes: Secondary | ICD-10-CM | POA: Diagnosis not present

## 2012-06-07 DIAGNOSIS — D509 Iron deficiency anemia, unspecified: Secondary | ICD-10-CM | POA: Diagnosis not present

## 2012-06-07 DIAGNOSIS — Z992 Dependence on renal dialysis: Secondary | ICD-10-CM | POA: Diagnosis not present

## 2012-06-07 DIAGNOSIS — E876 Hypokalemia: Secondary | ICD-10-CM | POA: Diagnosis not present

## 2012-06-09 DIAGNOSIS — D509 Iron deficiency anemia, unspecified: Secondary | ICD-10-CM | POA: Diagnosis not present

## 2012-06-09 DIAGNOSIS — N2581 Secondary hyperparathyroidism of renal origin: Secondary | ICD-10-CM | POA: Diagnosis not present

## 2012-06-09 DIAGNOSIS — N186 End stage renal disease: Secondary | ICD-10-CM | POA: Diagnosis not present

## 2012-06-09 DIAGNOSIS — D631 Anemia in chronic kidney disease: Secondary | ICD-10-CM | POA: Diagnosis not present

## 2012-06-09 DIAGNOSIS — Z992 Dependence on renal dialysis: Secondary | ICD-10-CM | POA: Diagnosis not present

## 2012-06-09 DIAGNOSIS — E876 Hypokalemia: Secondary | ICD-10-CM | POA: Diagnosis not present

## 2012-06-12 DIAGNOSIS — Z992 Dependence on renal dialysis: Secondary | ICD-10-CM | POA: Diagnosis not present

## 2012-06-12 DIAGNOSIS — D509 Iron deficiency anemia, unspecified: Secondary | ICD-10-CM | POA: Diagnosis not present

## 2012-06-12 DIAGNOSIS — N186 End stage renal disease: Secondary | ICD-10-CM | POA: Diagnosis not present

## 2012-06-12 DIAGNOSIS — N2581 Secondary hyperparathyroidism of renal origin: Secondary | ICD-10-CM | POA: Diagnosis not present

## 2012-06-12 DIAGNOSIS — E876 Hypokalemia: Secondary | ICD-10-CM | POA: Diagnosis not present

## 2012-06-12 DIAGNOSIS — D631 Anemia in chronic kidney disease: Secondary | ICD-10-CM | POA: Diagnosis not present

## 2012-06-14 DIAGNOSIS — N186 End stage renal disease: Secondary | ICD-10-CM | POA: Diagnosis not present

## 2012-06-14 DIAGNOSIS — N2581 Secondary hyperparathyroidism of renal origin: Secondary | ICD-10-CM | POA: Diagnosis not present

## 2012-06-14 DIAGNOSIS — D509 Iron deficiency anemia, unspecified: Secondary | ICD-10-CM | POA: Diagnosis not present

## 2012-06-14 DIAGNOSIS — E876 Hypokalemia: Secondary | ICD-10-CM | POA: Diagnosis not present

## 2012-06-14 DIAGNOSIS — D631 Anemia in chronic kidney disease: Secondary | ICD-10-CM | POA: Diagnosis not present

## 2012-06-14 DIAGNOSIS — Z992 Dependence on renal dialysis: Secondary | ICD-10-CM | POA: Diagnosis not present

## 2012-06-16 DIAGNOSIS — D631 Anemia in chronic kidney disease: Secondary | ICD-10-CM | POA: Diagnosis not present

## 2012-06-16 DIAGNOSIS — D509 Iron deficiency anemia, unspecified: Secondary | ICD-10-CM | POA: Diagnosis not present

## 2012-06-16 DIAGNOSIS — N2581 Secondary hyperparathyroidism of renal origin: Secondary | ICD-10-CM | POA: Diagnosis not present

## 2012-06-16 DIAGNOSIS — E876 Hypokalemia: Secondary | ICD-10-CM | POA: Diagnosis not present

## 2012-06-16 DIAGNOSIS — N186 End stage renal disease: Secondary | ICD-10-CM | POA: Diagnosis not present

## 2012-06-16 DIAGNOSIS — Z992 Dependence on renal dialysis: Secondary | ICD-10-CM | POA: Diagnosis not present

## 2012-06-20 DIAGNOSIS — Z992 Dependence on renal dialysis: Secondary | ICD-10-CM | POA: Diagnosis not present

## 2012-06-20 DIAGNOSIS — D509 Iron deficiency anemia, unspecified: Secondary | ICD-10-CM | POA: Diagnosis not present

## 2012-06-20 DIAGNOSIS — E876 Hypokalemia: Secondary | ICD-10-CM | POA: Diagnosis not present

## 2012-06-20 DIAGNOSIS — N186 End stage renal disease: Secondary | ICD-10-CM | POA: Diagnosis not present

## 2012-06-20 DIAGNOSIS — N2581 Secondary hyperparathyroidism of renal origin: Secondary | ICD-10-CM | POA: Diagnosis not present

## 2012-06-20 DIAGNOSIS — D631 Anemia in chronic kidney disease: Secondary | ICD-10-CM | POA: Diagnosis not present

## 2012-06-21 DIAGNOSIS — N186 End stage renal disease: Secondary | ICD-10-CM | POA: Diagnosis not present

## 2012-06-21 DIAGNOSIS — E876 Hypokalemia: Secondary | ICD-10-CM | POA: Diagnosis not present

## 2012-06-21 DIAGNOSIS — N2581 Secondary hyperparathyroidism of renal origin: Secondary | ICD-10-CM | POA: Diagnosis not present

## 2012-06-21 DIAGNOSIS — Z992 Dependence on renal dialysis: Secondary | ICD-10-CM | POA: Diagnosis not present

## 2012-06-21 DIAGNOSIS — D509 Iron deficiency anemia, unspecified: Secondary | ICD-10-CM | POA: Diagnosis not present

## 2012-06-21 DIAGNOSIS — N039 Chronic nephritic syndrome with unspecified morphologic changes: Secondary | ICD-10-CM | POA: Diagnosis not present

## 2012-06-23 DIAGNOSIS — D509 Iron deficiency anemia, unspecified: Secondary | ICD-10-CM | POA: Diagnosis not present

## 2012-06-23 DIAGNOSIS — N186 End stage renal disease: Secondary | ICD-10-CM | POA: Diagnosis not present

## 2012-06-23 DIAGNOSIS — N039 Chronic nephritic syndrome with unspecified morphologic changes: Secondary | ICD-10-CM | POA: Diagnosis not present

## 2012-06-23 DIAGNOSIS — E876 Hypokalemia: Secondary | ICD-10-CM | POA: Diagnosis not present

## 2012-06-23 DIAGNOSIS — Z992 Dependence on renal dialysis: Secondary | ICD-10-CM | POA: Diagnosis not present

## 2012-06-23 DIAGNOSIS — N2581 Secondary hyperparathyroidism of renal origin: Secondary | ICD-10-CM | POA: Diagnosis not present

## 2012-06-26 DIAGNOSIS — N039 Chronic nephritic syndrome with unspecified morphologic changes: Secondary | ICD-10-CM | POA: Diagnosis not present

## 2012-06-26 DIAGNOSIS — E876 Hypokalemia: Secondary | ICD-10-CM | POA: Diagnosis not present

## 2012-06-26 DIAGNOSIS — D509 Iron deficiency anemia, unspecified: Secondary | ICD-10-CM | POA: Diagnosis not present

## 2012-06-26 DIAGNOSIS — N186 End stage renal disease: Secondary | ICD-10-CM | POA: Diagnosis not present

## 2012-06-26 DIAGNOSIS — N2581 Secondary hyperparathyroidism of renal origin: Secondary | ICD-10-CM | POA: Diagnosis not present

## 2012-06-26 DIAGNOSIS — Z992 Dependence on renal dialysis: Secondary | ICD-10-CM | POA: Diagnosis not present

## 2012-06-29 DIAGNOSIS — D509 Iron deficiency anemia, unspecified: Secondary | ICD-10-CM | POA: Diagnosis not present

## 2012-06-29 DIAGNOSIS — Z992 Dependence on renal dialysis: Secondary | ICD-10-CM | POA: Diagnosis not present

## 2012-06-29 DIAGNOSIS — N186 End stage renal disease: Secondary | ICD-10-CM | POA: Diagnosis not present

## 2012-06-29 DIAGNOSIS — D631 Anemia in chronic kidney disease: Secondary | ICD-10-CM | POA: Diagnosis not present

## 2012-06-29 DIAGNOSIS — E876 Hypokalemia: Secondary | ICD-10-CM | POA: Diagnosis not present

## 2012-06-29 DIAGNOSIS — N2581 Secondary hyperparathyroidism of renal origin: Secondary | ICD-10-CM | POA: Diagnosis not present

## 2012-06-30 DIAGNOSIS — Z992 Dependence on renal dialysis: Secondary | ICD-10-CM | POA: Diagnosis not present

## 2012-06-30 DIAGNOSIS — N186 End stage renal disease: Secondary | ICD-10-CM | POA: Diagnosis not present

## 2012-06-30 DIAGNOSIS — D631 Anemia in chronic kidney disease: Secondary | ICD-10-CM | POA: Diagnosis not present

## 2012-06-30 DIAGNOSIS — D509 Iron deficiency anemia, unspecified: Secondary | ICD-10-CM | POA: Diagnosis not present

## 2012-06-30 DIAGNOSIS — N2581 Secondary hyperparathyroidism of renal origin: Secondary | ICD-10-CM | POA: Diagnosis not present

## 2012-06-30 DIAGNOSIS — E876 Hypokalemia: Secondary | ICD-10-CM | POA: Diagnosis not present

## 2012-07-01 DIAGNOSIS — N186 End stage renal disease: Secondary | ICD-10-CM | POA: Diagnosis not present

## 2012-07-03 DIAGNOSIS — E876 Hypokalemia: Secondary | ICD-10-CM | POA: Diagnosis not present

## 2012-07-03 DIAGNOSIS — Z23 Encounter for immunization: Secondary | ICD-10-CM | POA: Diagnosis not present

## 2012-07-03 DIAGNOSIS — D509 Iron deficiency anemia, unspecified: Secondary | ICD-10-CM | POA: Diagnosis not present

## 2012-07-03 DIAGNOSIS — D631 Anemia in chronic kidney disease: Secondary | ICD-10-CM | POA: Diagnosis not present

## 2012-07-03 DIAGNOSIS — N2581 Secondary hyperparathyroidism of renal origin: Secondary | ICD-10-CM | POA: Diagnosis not present

## 2012-07-03 DIAGNOSIS — Z992 Dependence on renal dialysis: Secondary | ICD-10-CM | POA: Diagnosis not present

## 2012-07-03 DIAGNOSIS — N186 End stage renal disease: Secondary | ICD-10-CM | POA: Diagnosis not present

## 2012-07-04 DIAGNOSIS — M48061 Spinal stenosis, lumbar region without neurogenic claudication: Secondary | ICD-10-CM | POA: Diagnosis not present

## 2012-07-04 DIAGNOSIS — IMO0002 Reserved for concepts with insufficient information to code with codable children: Secondary | ICD-10-CM | POA: Diagnosis not present

## 2012-07-11 DIAGNOSIS — M7650 Patellar tendinitis, unspecified knee: Secondary | ICD-10-CM | POA: Diagnosis not present

## 2012-07-11 DIAGNOSIS — M1710 Unilateral primary osteoarthritis, unspecified knee: Secondary | ICD-10-CM | POA: Diagnosis not present

## 2012-07-31 DIAGNOSIS — N186 End stage renal disease: Secondary | ICD-10-CM | POA: Diagnosis not present

## 2012-08-02 DIAGNOSIS — E876 Hypokalemia: Secondary | ICD-10-CM | POA: Diagnosis not present

## 2012-08-02 DIAGNOSIS — N039 Chronic nephritic syndrome with unspecified morphologic changes: Secondary | ICD-10-CM | POA: Diagnosis not present

## 2012-08-02 DIAGNOSIS — N2581 Secondary hyperparathyroidism of renal origin: Secondary | ICD-10-CM | POA: Diagnosis not present

## 2012-08-02 DIAGNOSIS — N186 End stage renal disease: Secondary | ICD-10-CM | POA: Diagnosis not present

## 2012-08-02 DIAGNOSIS — Z23 Encounter for immunization: Secondary | ICD-10-CM | POA: Diagnosis not present

## 2012-08-03 DIAGNOSIS — F411 Generalized anxiety disorder: Secondary | ICD-10-CM | POA: Diagnosis not present

## 2012-08-03 DIAGNOSIS — E785 Hyperlipidemia, unspecified: Secondary | ICD-10-CM | POA: Diagnosis not present

## 2012-08-03 DIAGNOSIS — E119 Type 2 diabetes mellitus without complications: Secondary | ICD-10-CM | POA: Diagnosis not present

## 2012-08-03 DIAGNOSIS — I10 Essential (primary) hypertension: Secondary | ICD-10-CM | POA: Diagnosis not present

## 2012-08-03 DIAGNOSIS — Z79899 Other long term (current) drug therapy: Secondary | ICD-10-CM | POA: Diagnosis not present

## 2012-08-03 DIAGNOSIS — F17200 Nicotine dependence, unspecified, uncomplicated: Secondary | ICD-10-CM | POA: Diagnosis not present

## 2012-08-04 DIAGNOSIS — Z23 Encounter for immunization: Secondary | ICD-10-CM | POA: Diagnosis not present

## 2012-08-04 DIAGNOSIS — N2581 Secondary hyperparathyroidism of renal origin: Secondary | ICD-10-CM | POA: Diagnosis not present

## 2012-08-04 DIAGNOSIS — N186 End stage renal disease: Secondary | ICD-10-CM | POA: Diagnosis not present

## 2012-08-04 DIAGNOSIS — N039 Chronic nephritic syndrome with unspecified morphologic changes: Secondary | ICD-10-CM | POA: Diagnosis not present

## 2012-08-04 DIAGNOSIS — E876 Hypokalemia: Secondary | ICD-10-CM | POA: Diagnosis not present

## 2012-08-05 DIAGNOSIS — K5732 Diverticulitis of large intestine without perforation or abscess without bleeding: Secondary | ICD-10-CM | POA: Diagnosis not present

## 2012-08-07 DIAGNOSIS — Z23 Encounter for immunization: Secondary | ICD-10-CM | POA: Diagnosis not present

## 2012-08-07 DIAGNOSIS — N2581 Secondary hyperparathyroidism of renal origin: Secondary | ICD-10-CM | POA: Diagnosis not present

## 2012-08-07 DIAGNOSIS — N186 End stage renal disease: Secondary | ICD-10-CM | POA: Diagnosis not present

## 2012-08-07 DIAGNOSIS — D631 Anemia in chronic kidney disease: Secondary | ICD-10-CM | POA: Diagnosis not present

## 2012-08-07 DIAGNOSIS — E876 Hypokalemia: Secondary | ICD-10-CM | POA: Diagnosis not present

## 2012-08-09 DIAGNOSIS — N039 Chronic nephritic syndrome with unspecified morphologic changes: Secondary | ICD-10-CM | POA: Diagnosis not present

## 2012-08-09 DIAGNOSIS — N2581 Secondary hyperparathyroidism of renal origin: Secondary | ICD-10-CM | POA: Diagnosis not present

## 2012-08-09 DIAGNOSIS — N186 End stage renal disease: Secondary | ICD-10-CM | POA: Diagnosis not present

## 2012-08-09 DIAGNOSIS — Z23 Encounter for immunization: Secondary | ICD-10-CM | POA: Diagnosis not present

## 2012-08-09 DIAGNOSIS — E876 Hypokalemia: Secondary | ICD-10-CM | POA: Diagnosis not present

## 2012-08-11 DIAGNOSIS — Z23 Encounter for immunization: Secondary | ICD-10-CM | POA: Diagnosis not present

## 2012-08-11 DIAGNOSIS — E876 Hypokalemia: Secondary | ICD-10-CM | POA: Diagnosis not present

## 2012-08-11 DIAGNOSIS — N186 End stage renal disease: Secondary | ICD-10-CM | POA: Diagnosis not present

## 2012-08-11 DIAGNOSIS — N2581 Secondary hyperparathyroidism of renal origin: Secondary | ICD-10-CM | POA: Diagnosis not present

## 2012-08-11 DIAGNOSIS — N039 Chronic nephritic syndrome with unspecified morphologic changes: Secondary | ICD-10-CM | POA: Diagnosis not present

## 2012-08-12 IMAGING — CT CT ABD-PELV W/O CM
2 of 4 series · 17 of 46 positions shown, 19 images · non-contrast
Comparison: 03/16/2004

CLINICAL DATA: Right-sided abdominal pain.  Renal insufficiency.

CT ABDOMEN AND PELVIS WITHOUT CONTRAST
TECHNIQUE: Multidetector CT imaging of the abdomen and pelvis was
performed following the standard protocol without intravenous
contrast.

[Series 2: abd/pelv w/o 5.0 b31f st · axial · non-contrast · 0.98mm/px · z∈[-527,-97]mm · 14 of 94 slices shown, 16 images]
[im 4/94  soft-tissue]
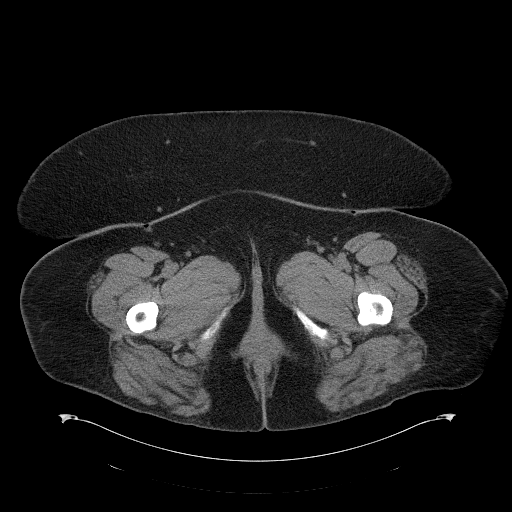
[im 4/94  bone]
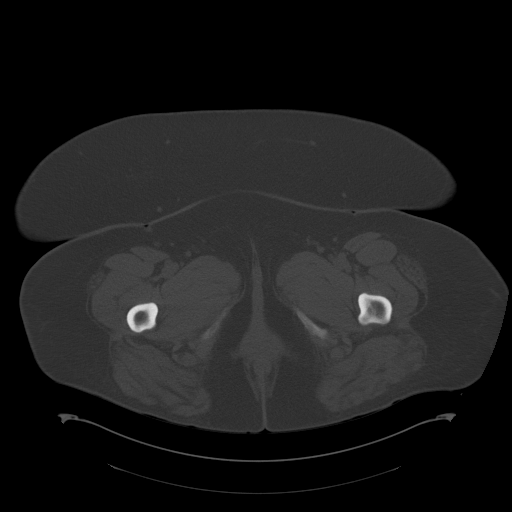
[im 12/94  soft-tissue]
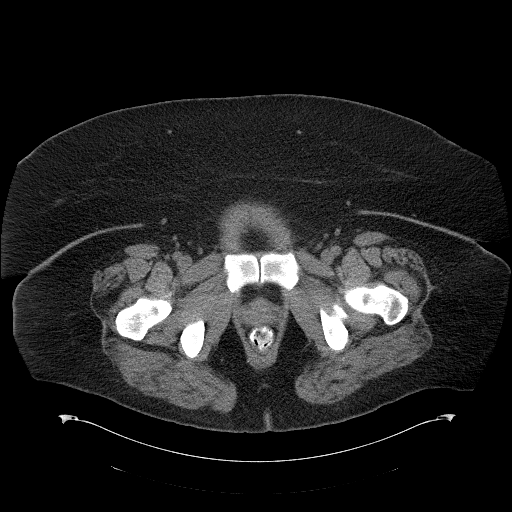
[im 20/94  soft-tissue]
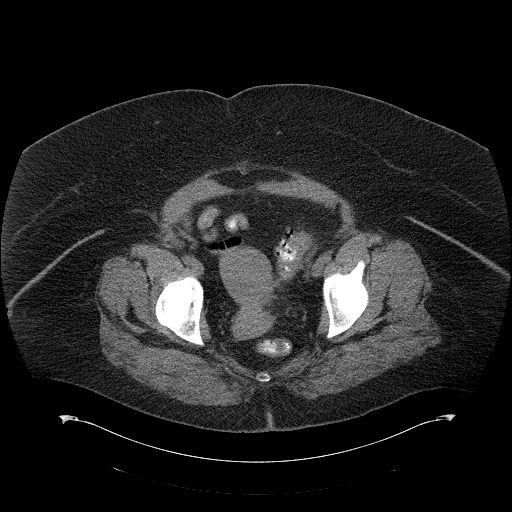
[im 24/94  soft-tissue]
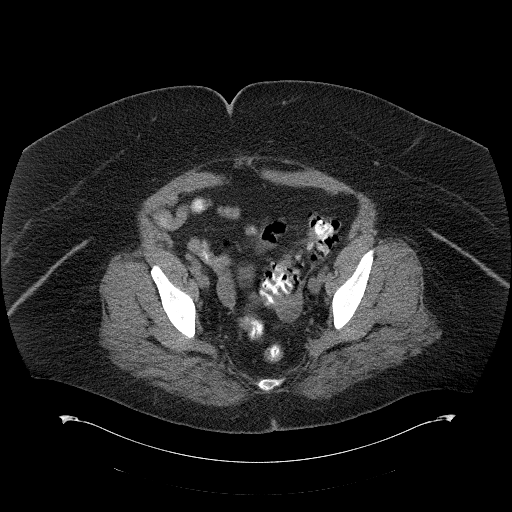
[im 32/94  soft-tissue]
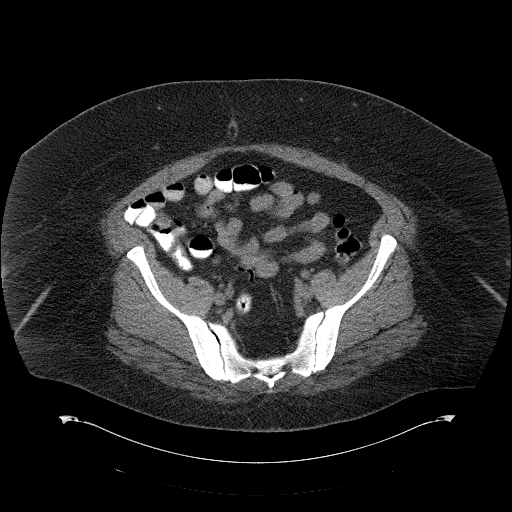
[im 39/94  soft-tissue]
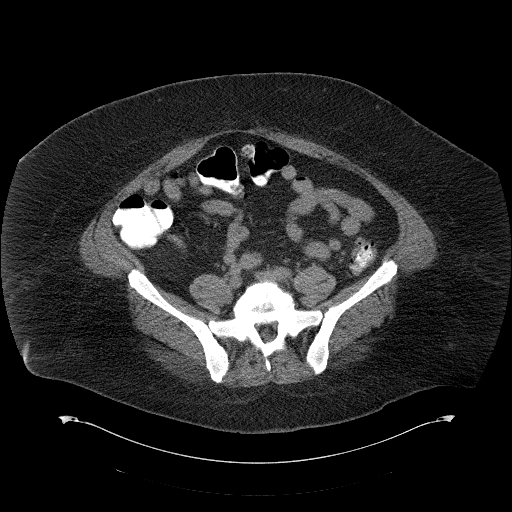
[im 43/94  soft-tissue]
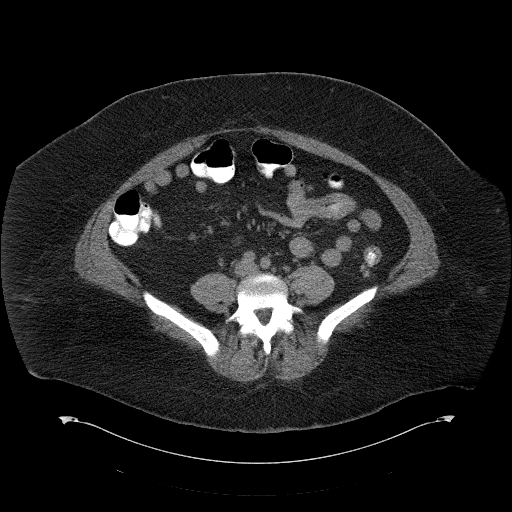
[im 51/94  soft-tissue]
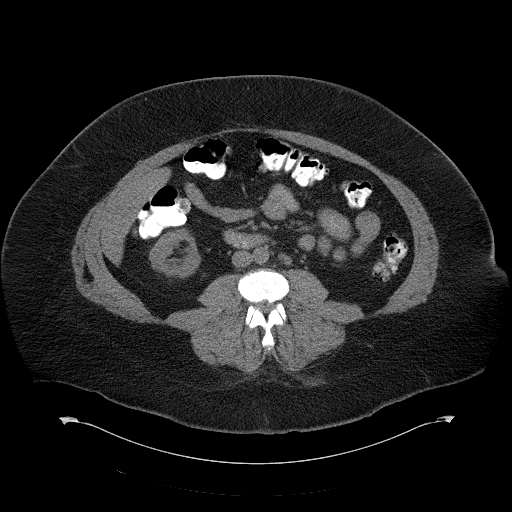
[im 55/94  soft-tissue]
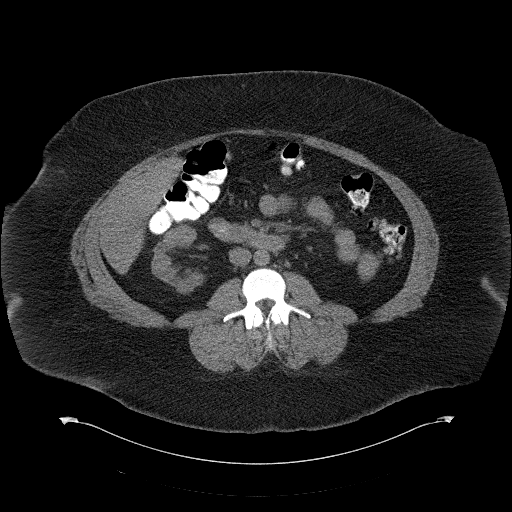
[im 55/94  bone]
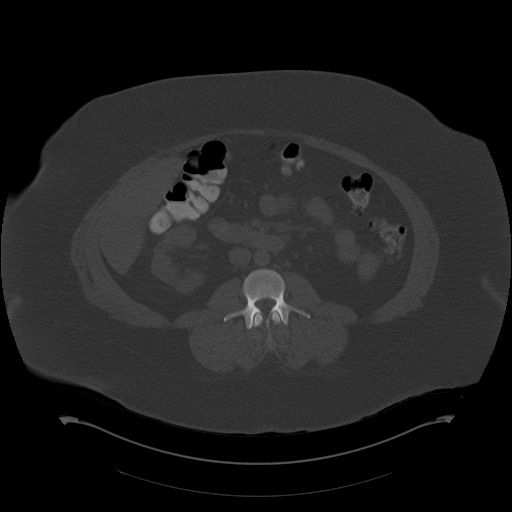
[im 63/94  soft-tissue]
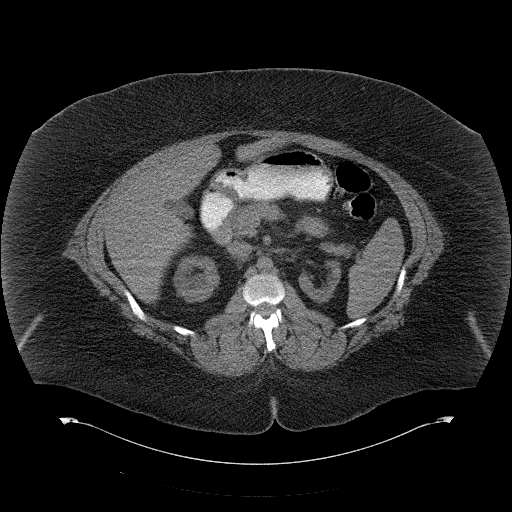
[im 70/94  soft-tissue]
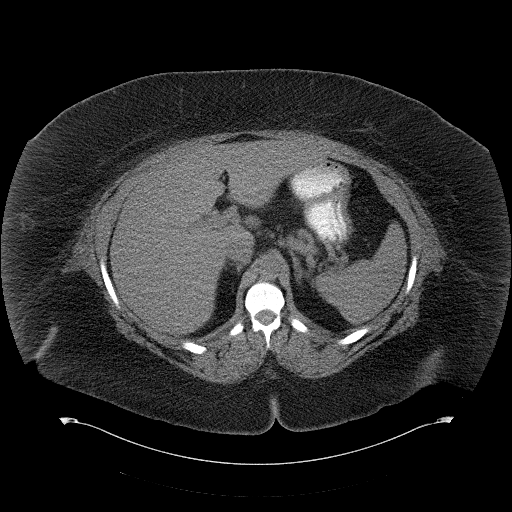
[im 74/94  soft-tissue]
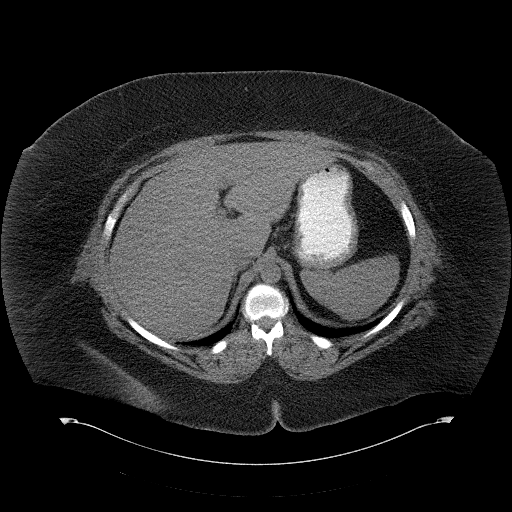
[im 82/94  soft-tissue]
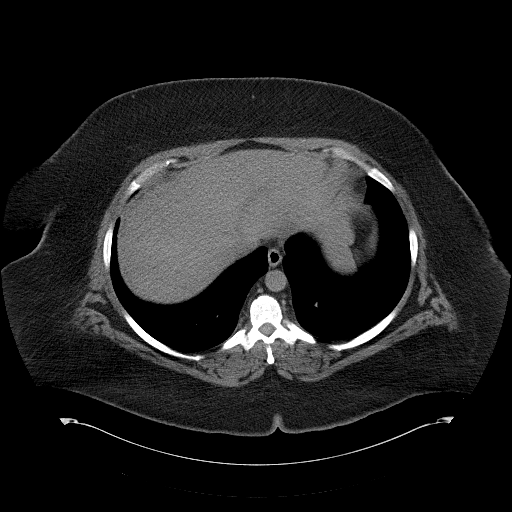
[im 90/94  soft-tissue]
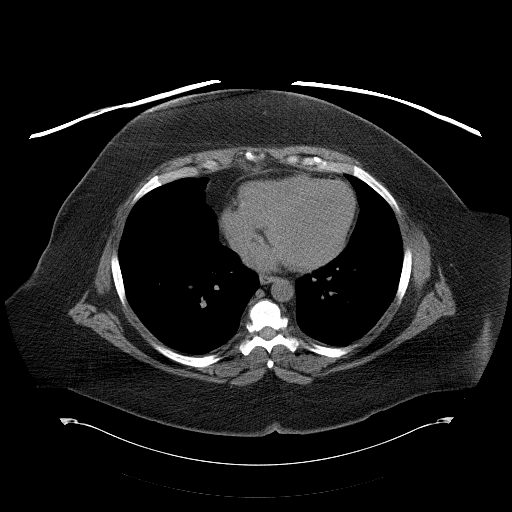

[Series 602: cor a/p · coronal · 0.98mm/px · 3 of 117 slices shown]
[im 39/117  soft-tissue]
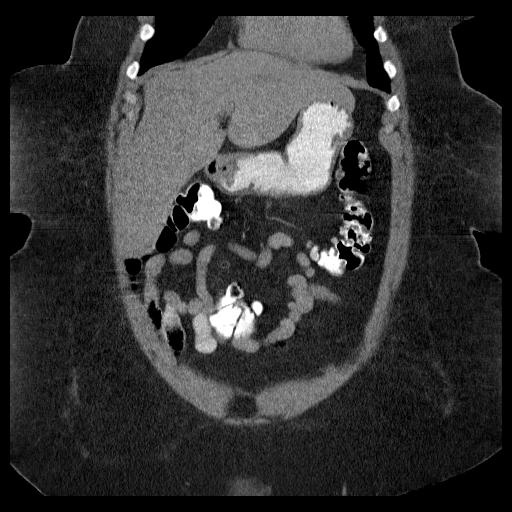
[im 52/117  soft-tissue]
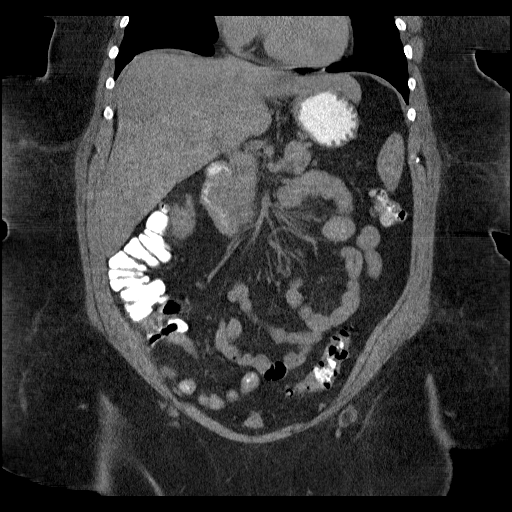
[im 65/117  soft-tissue]
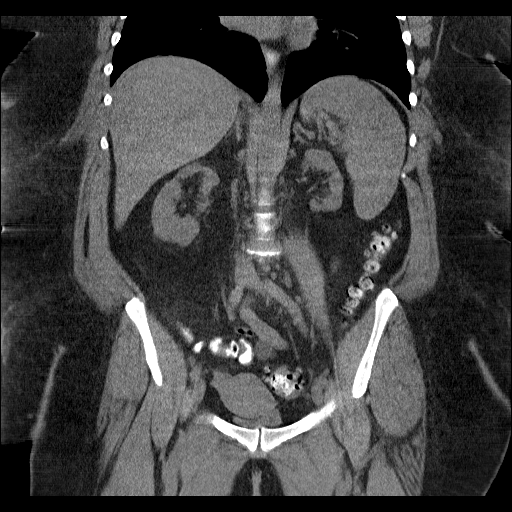

[17 of 46 positions shown; findings below may reference images not displayed]

FINDINGS: Bilateral renal parenchymal scarring and diffuse left
renal atrophy noted.  No evidence of renal calculi or
hydronephrosis.  No evidence of ureteral calculi or dilatation.

The other abdominal parenchymal organs appear normal on this
noncontrast study.  Left colonic diverticulosis is again noted.
Mild wall thickening is seen involving the proximal sigmoid colon,
with mild pericolonic inflammatory changes, consistent with mild
diverticulitis.  There is no evidence of abscess free fluid.  No
evidence of bowel obstruction.  No soft tissue masses or
lymphadenopathy identified.
IMPRESSION: Mild diverticulitis involving the proximal sigmoid colon.  No
evidence of abscess or other complication.

## 2012-08-14 DIAGNOSIS — N039 Chronic nephritic syndrome with unspecified morphologic changes: Secondary | ICD-10-CM | POA: Diagnosis not present

## 2012-08-14 DIAGNOSIS — N186 End stage renal disease: Secondary | ICD-10-CM | POA: Diagnosis not present

## 2012-08-14 DIAGNOSIS — N2581 Secondary hyperparathyroidism of renal origin: Secondary | ICD-10-CM | POA: Diagnosis not present

## 2012-08-14 DIAGNOSIS — D631 Anemia in chronic kidney disease: Secondary | ICD-10-CM | POA: Diagnosis not present

## 2012-08-14 DIAGNOSIS — E876 Hypokalemia: Secondary | ICD-10-CM | POA: Diagnosis not present

## 2012-08-14 DIAGNOSIS — Z23 Encounter for immunization: Secondary | ICD-10-CM | POA: Diagnosis not present

## 2012-08-15 DIAGNOSIS — D509 Iron deficiency anemia, unspecified: Secondary | ICD-10-CM | POA: Diagnosis not present

## 2012-08-15 DIAGNOSIS — N2581 Secondary hyperparathyroidism of renal origin: Secondary | ICD-10-CM | POA: Diagnosis not present

## 2012-08-15 DIAGNOSIS — D631 Anemia in chronic kidney disease: Secondary | ICD-10-CM | POA: Diagnosis not present

## 2012-08-15 DIAGNOSIS — N186 End stage renal disease: Secondary | ICD-10-CM | POA: Diagnosis not present

## 2012-08-16 DIAGNOSIS — D509 Iron deficiency anemia, unspecified: Secondary | ICD-10-CM | POA: Diagnosis not present

## 2012-08-16 DIAGNOSIS — N2581 Secondary hyperparathyroidism of renal origin: Secondary | ICD-10-CM | POA: Diagnosis not present

## 2012-08-16 DIAGNOSIS — N186 End stage renal disease: Secondary | ICD-10-CM | POA: Diagnosis not present

## 2012-08-16 DIAGNOSIS — D631 Anemia in chronic kidney disease: Secondary | ICD-10-CM | POA: Diagnosis not present

## 2012-08-17 DIAGNOSIS — D509 Iron deficiency anemia, unspecified: Secondary | ICD-10-CM | POA: Diagnosis not present

## 2012-08-17 DIAGNOSIS — N186 End stage renal disease: Secondary | ICD-10-CM | POA: Diagnosis not present

## 2012-08-17 DIAGNOSIS — N039 Chronic nephritic syndrome with unspecified morphologic changes: Secondary | ICD-10-CM | POA: Diagnosis not present

## 2012-08-17 DIAGNOSIS — N2581 Secondary hyperparathyroidism of renal origin: Secondary | ICD-10-CM | POA: Diagnosis not present

## 2012-08-17 DIAGNOSIS — D631 Anemia in chronic kidney disease: Secondary | ICD-10-CM | POA: Diagnosis not present

## 2012-08-18 DIAGNOSIS — D631 Anemia in chronic kidney disease: Secondary | ICD-10-CM | POA: Diagnosis not present

## 2012-08-18 DIAGNOSIS — N2581 Secondary hyperparathyroidism of renal origin: Secondary | ICD-10-CM | POA: Diagnosis not present

## 2012-08-18 DIAGNOSIS — D509 Iron deficiency anemia, unspecified: Secondary | ICD-10-CM | POA: Diagnosis not present

## 2012-08-18 DIAGNOSIS — N186 End stage renal disease: Secondary | ICD-10-CM | POA: Diagnosis not present

## 2012-08-21 DIAGNOSIS — D509 Iron deficiency anemia, unspecified: Secondary | ICD-10-CM | POA: Diagnosis not present

## 2012-08-21 DIAGNOSIS — N2581 Secondary hyperparathyroidism of renal origin: Secondary | ICD-10-CM | POA: Diagnosis not present

## 2012-08-21 DIAGNOSIS — D631 Anemia in chronic kidney disease: Secondary | ICD-10-CM | POA: Diagnosis not present

## 2012-08-21 DIAGNOSIS — N186 End stage renal disease: Secondary | ICD-10-CM | POA: Diagnosis not present

## 2012-08-22 DIAGNOSIS — N186 End stage renal disease: Secondary | ICD-10-CM | POA: Diagnosis not present

## 2012-08-22 DIAGNOSIS — D509 Iron deficiency anemia, unspecified: Secondary | ICD-10-CM | POA: Diagnosis not present

## 2012-08-22 DIAGNOSIS — N2581 Secondary hyperparathyroidism of renal origin: Secondary | ICD-10-CM | POA: Diagnosis not present

## 2012-08-22 DIAGNOSIS — D631 Anemia in chronic kidney disease: Secondary | ICD-10-CM | POA: Diagnosis not present

## 2012-08-23 DIAGNOSIS — IMO0002 Reserved for concepts with insufficient information to code with codable children: Secondary | ICD-10-CM | POA: Diagnosis not present

## 2012-08-23 DIAGNOSIS — N186 End stage renal disease: Secondary | ICD-10-CM | POA: Diagnosis not present

## 2012-08-23 DIAGNOSIS — D631 Anemia in chronic kidney disease: Secondary | ICD-10-CM | POA: Diagnosis not present

## 2012-08-23 DIAGNOSIS — M48061 Spinal stenosis, lumbar region without neurogenic claudication: Secondary | ICD-10-CM | POA: Diagnosis not present

## 2012-08-23 DIAGNOSIS — Z79899 Other long term (current) drug therapy: Secondary | ICD-10-CM | POA: Diagnosis not present

## 2012-08-23 DIAGNOSIS — D509 Iron deficiency anemia, unspecified: Secondary | ICD-10-CM | POA: Diagnosis not present

## 2012-08-23 DIAGNOSIS — N2581 Secondary hyperparathyroidism of renal origin: Secondary | ICD-10-CM | POA: Diagnosis not present

## 2012-08-24 DIAGNOSIS — N186 End stage renal disease: Secondary | ICD-10-CM | POA: Diagnosis not present

## 2012-08-24 DIAGNOSIS — N039 Chronic nephritic syndrome with unspecified morphologic changes: Secondary | ICD-10-CM | POA: Diagnosis not present

## 2012-08-24 DIAGNOSIS — N2581 Secondary hyperparathyroidism of renal origin: Secondary | ICD-10-CM | POA: Diagnosis not present

## 2012-08-24 DIAGNOSIS — D509 Iron deficiency anemia, unspecified: Secondary | ICD-10-CM | POA: Diagnosis not present

## 2012-08-25 DIAGNOSIS — D631 Anemia in chronic kidney disease: Secondary | ICD-10-CM | POA: Diagnosis not present

## 2012-08-25 DIAGNOSIS — N039 Chronic nephritic syndrome with unspecified morphologic changes: Secondary | ICD-10-CM | POA: Diagnosis not present

## 2012-08-25 DIAGNOSIS — N2581 Secondary hyperparathyroidism of renal origin: Secondary | ICD-10-CM | POA: Diagnosis not present

## 2012-08-25 DIAGNOSIS — D509 Iron deficiency anemia, unspecified: Secondary | ICD-10-CM | POA: Diagnosis not present

## 2012-08-25 DIAGNOSIS — N186 End stage renal disease: Secondary | ICD-10-CM | POA: Diagnosis not present

## 2012-08-28 DIAGNOSIS — D509 Iron deficiency anemia, unspecified: Secondary | ICD-10-CM | POA: Diagnosis not present

## 2012-08-28 DIAGNOSIS — N186 End stage renal disease: Secondary | ICD-10-CM | POA: Diagnosis not present

## 2012-08-28 DIAGNOSIS — N039 Chronic nephritic syndrome with unspecified morphologic changes: Secondary | ICD-10-CM | POA: Diagnosis not present

## 2012-08-28 DIAGNOSIS — N2581 Secondary hyperparathyroidism of renal origin: Secondary | ICD-10-CM | POA: Diagnosis not present

## 2012-08-29 DIAGNOSIS — D509 Iron deficiency anemia, unspecified: Secondary | ICD-10-CM | POA: Diagnosis not present

## 2012-08-29 DIAGNOSIS — N186 End stage renal disease: Secondary | ICD-10-CM | POA: Diagnosis not present

## 2012-08-29 DIAGNOSIS — N039 Chronic nephritic syndrome with unspecified morphologic changes: Secondary | ICD-10-CM | POA: Diagnosis not present

## 2012-08-29 DIAGNOSIS — N2581 Secondary hyperparathyroidism of renal origin: Secondary | ICD-10-CM | POA: Diagnosis not present

## 2012-08-30 DIAGNOSIS — D509 Iron deficiency anemia, unspecified: Secondary | ICD-10-CM | POA: Diagnosis not present

## 2012-08-30 DIAGNOSIS — N186 End stage renal disease: Secondary | ICD-10-CM | POA: Diagnosis not present

## 2012-08-30 DIAGNOSIS — D631 Anemia in chronic kidney disease: Secondary | ICD-10-CM | POA: Diagnosis not present

## 2012-08-30 DIAGNOSIS — N2581 Secondary hyperparathyroidism of renal origin: Secondary | ICD-10-CM | POA: Diagnosis not present

## 2012-08-31 DIAGNOSIS — N186 End stage renal disease: Secondary | ICD-10-CM | POA: Diagnosis not present

## 2012-08-31 DIAGNOSIS — N2581 Secondary hyperparathyroidism of renal origin: Secondary | ICD-10-CM | POA: Diagnosis not present

## 2012-08-31 DIAGNOSIS — D509 Iron deficiency anemia, unspecified: Secondary | ICD-10-CM | POA: Diagnosis not present

## 2012-08-31 DIAGNOSIS — N039 Chronic nephritic syndrome with unspecified morphologic changes: Secondary | ICD-10-CM | POA: Diagnosis not present

## 2012-09-01 DIAGNOSIS — Z992 Dependence on renal dialysis: Secondary | ICD-10-CM | POA: Diagnosis not present

## 2012-09-01 DIAGNOSIS — N2581 Secondary hyperparathyroidism of renal origin: Secondary | ICD-10-CM | POA: Diagnosis not present

## 2012-09-01 DIAGNOSIS — D539 Nutritional anemia, unspecified: Secondary | ICD-10-CM | POA: Diagnosis not present

## 2012-09-01 DIAGNOSIS — E876 Hypokalemia: Secondary | ICD-10-CM | POA: Diagnosis not present

## 2012-09-01 DIAGNOSIS — D509 Iron deficiency anemia, unspecified: Secondary | ICD-10-CM | POA: Diagnosis not present

## 2012-09-01 DIAGNOSIS — N186 End stage renal disease: Secondary | ICD-10-CM | POA: Diagnosis not present

## 2012-09-01 DIAGNOSIS — D631 Anemia in chronic kidney disease: Secondary | ICD-10-CM | POA: Diagnosis not present

## 2012-09-04 DIAGNOSIS — D539 Nutritional anemia, unspecified: Secondary | ICD-10-CM | POA: Diagnosis not present

## 2012-09-04 DIAGNOSIS — N186 End stage renal disease: Secondary | ICD-10-CM | POA: Diagnosis not present

## 2012-09-04 DIAGNOSIS — N2581 Secondary hyperparathyroidism of renal origin: Secondary | ICD-10-CM | POA: Diagnosis not present

## 2012-09-04 DIAGNOSIS — E876 Hypokalemia: Secondary | ICD-10-CM | POA: Diagnosis not present

## 2012-09-04 DIAGNOSIS — D509 Iron deficiency anemia, unspecified: Secondary | ICD-10-CM | POA: Diagnosis not present

## 2012-09-04 DIAGNOSIS — N039 Chronic nephritic syndrome with unspecified morphologic changes: Secondary | ICD-10-CM | POA: Diagnosis not present

## 2012-09-05 DIAGNOSIS — N186 End stage renal disease: Secondary | ICD-10-CM | POA: Diagnosis not present

## 2012-09-05 DIAGNOSIS — N2581 Secondary hyperparathyroidism of renal origin: Secondary | ICD-10-CM | POA: Diagnosis not present

## 2012-09-05 DIAGNOSIS — N039 Chronic nephritic syndrome with unspecified morphologic changes: Secondary | ICD-10-CM | POA: Diagnosis not present

## 2012-09-05 DIAGNOSIS — D509 Iron deficiency anemia, unspecified: Secondary | ICD-10-CM | POA: Diagnosis not present

## 2012-09-05 DIAGNOSIS — E876 Hypokalemia: Secondary | ICD-10-CM | POA: Diagnosis not present

## 2012-09-05 DIAGNOSIS — D539 Nutritional anemia, unspecified: Secondary | ICD-10-CM | POA: Diagnosis not present

## 2012-09-06 DIAGNOSIS — D631 Anemia in chronic kidney disease: Secondary | ICD-10-CM | POA: Diagnosis not present

## 2012-09-06 DIAGNOSIS — N186 End stage renal disease: Secondary | ICD-10-CM | POA: Diagnosis not present

## 2012-09-06 DIAGNOSIS — D509 Iron deficiency anemia, unspecified: Secondary | ICD-10-CM | POA: Diagnosis not present

## 2012-09-06 DIAGNOSIS — E876 Hypokalemia: Secondary | ICD-10-CM | POA: Diagnosis not present

## 2012-09-06 DIAGNOSIS — D539 Nutritional anemia, unspecified: Secondary | ICD-10-CM | POA: Diagnosis not present

## 2012-09-06 DIAGNOSIS — N2581 Secondary hyperparathyroidism of renal origin: Secondary | ICD-10-CM | POA: Diagnosis not present

## 2012-09-07 DIAGNOSIS — D509 Iron deficiency anemia, unspecified: Secondary | ICD-10-CM | POA: Diagnosis not present

## 2012-09-07 DIAGNOSIS — E876 Hypokalemia: Secondary | ICD-10-CM | POA: Diagnosis not present

## 2012-09-07 DIAGNOSIS — D539 Nutritional anemia, unspecified: Secondary | ICD-10-CM | POA: Diagnosis not present

## 2012-09-07 DIAGNOSIS — N2581 Secondary hyperparathyroidism of renal origin: Secondary | ICD-10-CM | POA: Diagnosis not present

## 2012-09-07 DIAGNOSIS — N186 End stage renal disease: Secondary | ICD-10-CM | POA: Diagnosis not present

## 2012-09-07 DIAGNOSIS — N039 Chronic nephritic syndrome with unspecified morphologic changes: Secondary | ICD-10-CM | POA: Diagnosis not present

## 2012-09-08 DIAGNOSIS — N2581 Secondary hyperparathyroidism of renal origin: Secondary | ICD-10-CM | POA: Diagnosis not present

## 2012-09-08 DIAGNOSIS — E876 Hypokalemia: Secondary | ICD-10-CM | POA: Diagnosis not present

## 2012-09-08 DIAGNOSIS — N186 End stage renal disease: Secondary | ICD-10-CM | POA: Diagnosis not present

## 2012-09-08 DIAGNOSIS — N039 Chronic nephritic syndrome with unspecified morphologic changes: Secondary | ICD-10-CM | POA: Diagnosis not present

## 2012-09-08 DIAGNOSIS — D539 Nutritional anemia, unspecified: Secondary | ICD-10-CM | POA: Diagnosis not present

## 2012-09-08 DIAGNOSIS — D509 Iron deficiency anemia, unspecified: Secondary | ICD-10-CM | POA: Diagnosis not present

## 2012-09-11 DIAGNOSIS — D509 Iron deficiency anemia, unspecified: Secondary | ICD-10-CM | POA: Diagnosis not present

## 2012-09-11 DIAGNOSIS — E876 Hypokalemia: Secondary | ICD-10-CM | POA: Diagnosis not present

## 2012-09-11 DIAGNOSIS — N186 End stage renal disease: Secondary | ICD-10-CM | POA: Diagnosis not present

## 2012-09-11 DIAGNOSIS — D539 Nutritional anemia, unspecified: Secondary | ICD-10-CM | POA: Diagnosis not present

## 2012-09-11 DIAGNOSIS — N039 Chronic nephritic syndrome with unspecified morphologic changes: Secondary | ICD-10-CM | POA: Diagnosis not present

## 2012-09-11 DIAGNOSIS — N2581 Secondary hyperparathyroidism of renal origin: Secondary | ICD-10-CM | POA: Diagnosis not present

## 2012-09-12 DIAGNOSIS — N186 End stage renal disease: Secondary | ICD-10-CM | POA: Diagnosis not present

## 2012-09-12 DIAGNOSIS — D631 Anemia in chronic kidney disease: Secondary | ICD-10-CM | POA: Diagnosis not present

## 2012-09-12 DIAGNOSIS — N2581 Secondary hyperparathyroidism of renal origin: Secondary | ICD-10-CM | POA: Diagnosis not present

## 2012-09-12 DIAGNOSIS — D539 Nutritional anemia, unspecified: Secondary | ICD-10-CM | POA: Diagnosis not present

## 2012-09-12 DIAGNOSIS — E876 Hypokalemia: Secondary | ICD-10-CM | POA: Diagnosis not present

## 2012-09-12 DIAGNOSIS — D509 Iron deficiency anemia, unspecified: Secondary | ICD-10-CM | POA: Diagnosis not present

## 2012-09-14 DIAGNOSIS — N186 End stage renal disease: Secondary | ICD-10-CM | POA: Diagnosis not present

## 2012-09-14 DIAGNOSIS — N039 Chronic nephritic syndrome with unspecified morphologic changes: Secondary | ICD-10-CM | POA: Diagnosis not present

## 2012-09-14 DIAGNOSIS — D631 Anemia in chronic kidney disease: Secondary | ICD-10-CM | POA: Diagnosis not present

## 2012-09-14 DIAGNOSIS — T82898A Other specified complication of vascular prosthetic devices, implants and grafts, initial encounter: Secondary | ICD-10-CM | POA: Diagnosis not present

## 2012-09-14 DIAGNOSIS — L02219 Cutaneous abscess of trunk, unspecified: Secondary | ICD-10-CM | POA: Diagnosis not present

## 2012-09-14 DIAGNOSIS — E878 Other disorders of electrolyte and fluid balance, not elsewhere classified: Secondary | ICD-10-CM | POA: Diagnosis not present

## 2012-09-15 DIAGNOSIS — N039 Chronic nephritic syndrome with unspecified morphologic changes: Secondary | ICD-10-CM | POA: Diagnosis not present

## 2012-09-15 DIAGNOSIS — T82898A Other specified complication of vascular prosthetic devices, implants and grafts, initial encounter: Secondary | ICD-10-CM | POA: Diagnosis not present

## 2012-09-15 DIAGNOSIS — D631 Anemia in chronic kidney disease: Secondary | ICD-10-CM | POA: Diagnosis not present

## 2012-09-15 DIAGNOSIS — E878 Other disorders of electrolyte and fluid balance, not elsewhere classified: Secondary | ICD-10-CM | POA: Diagnosis not present

## 2012-09-15 DIAGNOSIS — N186 End stage renal disease: Secondary | ICD-10-CM | POA: Diagnosis not present

## 2012-09-16 DIAGNOSIS — N039 Chronic nephritic syndrome with unspecified morphologic changes: Secondary | ICD-10-CM | POA: Diagnosis not present

## 2012-09-16 DIAGNOSIS — T82898A Other specified complication of vascular prosthetic devices, implants and grafts, initial encounter: Secondary | ICD-10-CM | POA: Diagnosis not present

## 2012-09-16 DIAGNOSIS — E878 Other disorders of electrolyte and fluid balance, not elsewhere classified: Secondary | ICD-10-CM | POA: Diagnosis not present

## 2012-09-16 DIAGNOSIS — N186 End stage renal disease: Secondary | ICD-10-CM | POA: Diagnosis not present

## 2012-09-18 DIAGNOSIS — N039 Chronic nephritic syndrome with unspecified morphologic changes: Secondary | ICD-10-CM | POA: Diagnosis not present

## 2012-09-18 DIAGNOSIS — E878 Other disorders of electrolyte and fluid balance, not elsewhere classified: Secondary | ICD-10-CM | POA: Diagnosis not present

## 2012-09-18 DIAGNOSIS — T82898A Other specified complication of vascular prosthetic devices, implants and grafts, initial encounter: Secondary | ICD-10-CM | POA: Diagnosis not present

## 2012-09-18 DIAGNOSIS — N186 End stage renal disease: Secondary | ICD-10-CM | POA: Diagnosis not present

## 2012-09-19 DIAGNOSIS — T82898A Other specified complication of vascular prosthetic devices, implants and grafts, initial encounter: Secondary | ICD-10-CM | POA: Diagnosis not present

## 2012-09-19 DIAGNOSIS — N039 Chronic nephritic syndrome with unspecified morphologic changes: Secondary | ICD-10-CM | POA: Diagnosis not present

## 2012-09-19 DIAGNOSIS — E878 Other disorders of electrolyte and fluid balance, not elsewhere classified: Secondary | ICD-10-CM | POA: Diagnosis not present

## 2012-09-19 DIAGNOSIS — N186 End stage renal disease: Secondary | ICD-10-CM | POA: Diagnosis not present

## 2012-09-21 DIAGNOSIS — T82898A Other specified complication of vascular prosthetic devices, implants and grafts, initial encounter: Secondary | ICD-10-CM | POA: Diagnosis not present

## 2012-09-21 DIAGNOSIS — N186 End stage renal disease: Secondary | ICD-10-CM | POA: Diagnosis not present

## 2012-09-21 DIAGNOSIS — N039 Chronic nephritic syndrome with unspecified morphologic changes: Secondary | ICD-10-CM | POA: Diagnosis not present

## 2012-09-21 DIAGNOSIS — E878 Other disorders of electrolyte and fluid balance, not elsewhere classified: Secondary | ICD-10-CM | POA: Diagnosis not present

## 2012-09-25 DIAGNOSIS — N186 End stage renal disease: Secondary | ICD-10-CM | POA: Diagnosis not present

## 2012-09-25 DIAGNOSIS — T82898A Other specified complication of vascular prosthetic devices, implants and grafts, initial encounter: Secondary | ICD-10-CM | POA: Diagnosis not present

## 2012-09-25 DIAGNOSIS — E878 Other disorders of electrolyte and fluid balance, not elsewhere classified: Secondary | ICD-10-CM | POA: Diagnosis not present

## 2012-09-25 DIAGNOSIS — D631 Anemia in chronic kidney disease: Secondary | ICD-10-CM | POA: Diagnosis not present

## 2012-09-27 DIAGNOSIS — N186 End stage renal disease: Secondary | ICD-10-CM | POA: Diagnosis not present

## 2012-09-27 DIAGNOSIS — D631 Anemia in chronic kidney disease: Secondary | ICD-10-CM | POA: Diagnosis not present

## 2012-09-27 DIAGNOSIS — T82898A Other specified complication of vascular prosthetic devices, implants and grafts, initial encounter: Secondary | ICD-10-CM | POA: Diagnosis not present

## 2012-09-27 DIAGNOSIS — E878 Other disorders of electrolyte and fluid balance, not elsewhere classified: Secondary | ICD-10-CM | POA: Diagnosis not present

## 2012-09-29 DIAGNOSIS — N186 End stage renal disease: Secondary | ICD-10-CM | POA: Diagnosis not present

## 2012-09-29 DIAGNOSIS — D631 Anemia in chronic kidney disease: Secondary | ICD-10-CM | POA: Diagnosis not present

## 2012-09-29 DIAGNOSIS — E878 Other disorders of electrolyte and fluid balance, not elsewhere classified: Secondary | ICD-10-CM | POA: Diagnosis not present

## 2012-09-29 DIAGNOSIS — T82898A Other specified complication of vascular prosthetic devices, implants and grafts, initial encounter: Secondary | ICD-10-CM | POA: Diagnosis not present

## 2012-09-30 DIAGNOSIS — N186 End stage renal disease: Secondary | ICD-10-CM | POA: Diagnosis not present

## 2012-10-02 DIAGNOSIS — Z992 Dependence on renal dialysis: Secondary | ICD-10-CM | POA: Diagnosis not present

## 2012-10-02 DIAGNOSIS — I953 Hypotension of hemodialysis: Secondary | ICD-10-CM | POA: Diagnosis not present

## 2012-10-02 DIAGNOSIS — T82898A Other specified complication of vascular prosthetic devices, implants and grafts, initial encounter: Secondary | ICD-10-CM | POA: Diagnosis not present

## 2012-10-02 DIAGNOSIS — N186 End stage renal disease: Secondary | ICD-10-CM | POA: Diagnosis not present

## 2012-10-02 DIAGNOSIS — D539 Nutritional anemia, unspecified: Secondary | ICD-10-CM | POA: Diagnosis not present

## 2012-10-02 DIAGNOSIS — I120 Hypertensive chronic kidney disease with stage 5 chronic kidney disease or end stage renal disease: Secondary | ICD-10-CM | POA: Diagnosis not present

## 2012-10-03 DIAGNOSIS — T82898A Other specified complication of vascular prosthetic devices, implants and grafts, initial encounter: Secondary | ICD-10-CM | POA: Diagnosis not present

## 2012-10-03 DIAGNOSIS — N186 End stage renal disease: Secondary | ICD-10-CM | POA: Diagnosis not present

## 2012-10-03 DIAGNOSIS — I120 Hypertensive chronic kidney disease with stage 5 chronic kidney disease or end stage renal disease: Secondary | ICD-10-CM | POA: Diagnosis not present

## 2012-10-03 DIAGNOSIS — I953 Hypotension of hemodialysis: Secondary | ICD-10-CM | POA: Diagnosis not present

## 2012-10-03 DIAGNOSIS — Z992 Dependence on renal dialysis: Secondary | ICD-10-CM | POA: Diagnosis not present

## 2012-10-03 DIAGNOSIS — D539 Nutritional anemia, unspecified: Secondary | ICD-10-CM | POA: Diagnosis not present

## 2012-10-04 DIAGNOSIS — M549 Dorsalgia, unspecified: Secondary | ICD-10-CM | POA: Diagnosis not present

## 2012-10-04 DIAGNOSIS — T82898A Other specified complication of vascular prosthetic devices, implants and grafts, initial encounter: Secondary | ICD-10-CM | POA: Diagnosis not present

## 2012-10-04 DIAGNOSIS — M5430 Sciatica, unspecified side: Secondary | ICD-10-CM | POA: Diagnosis not present

## 2012-10-04 DIAGNOSIS — Z992 Dependence on renal dialysis: Secondary | ICD-10-CM | POA: Diagnosis not present

## 2012-10-04 DIAGNOSIS — I953 Hypotension of hemodialysis: Secondary | ICD-10-CM | POA: Diagnosis not present

## 2012-10-04 DIAGNOSIS — I120 Hypertensive chronic kidney disease with stage 5 chronic kidney disease or end stage renal disease: Secondary | ICD-10-CM | POA: Diagnosis not present

## 2012-10-04 DIAGNOSIS — N186 End stage renal disease: Secondary | ICD-10-CM | POA: Diagnosis not present

## 2012-10-04 DIAGNOSIS — D539 Nutritional anemia, unspecified: Secondary | ICD-10-CM | POA: Diagnosis not present

## 2012-10-06 DIAGNOSIS — I120 Hypertensive chronic kidney disease with stage 5 chronic kidney disease or end stage renal disease: Secondary | ICD-10-CM | POA: Diagnosis not present

## 2012-10-06 DIAGNOSIS — D539 Nutritional anemia, unspecified: Secondary | ICD-10-CM | POA: Diagnosis not present

## 2012-10-06 DIAGNOSIS — N186 End stage renal disease: Secondary | ICD-10-CM | POA: Diagnosis not present

## 2012-10-06 DIAGNOSIS — Z992 Dependence on renal dialysis: Secondary | ICD-10-CM | POA: Diagnosis not present

## 2012-10-06 DIAGNOSIS — T82898A Other specified complication of vascular prosthetic devices, implants and grafts, initial encounter: Secondary | ICD-10-CM | POA: Diagnosis not present

## 2012-10-06 DIAGNOSIS — I953 Hypotension of hemodialysis: Secondary | ICD-10-CM | POA: Diagnosis not present

## 2012-10-07 DIAGNOSIS — Z992 Dependence on renal dialysis: Secondary | ICD-10-CM | POA: Diagnosis not present

## 2012-10-07 DIAGNOSIS — I953 Hypotension of hemodialysis: Secondary | ICD-10-CM | POA: Diagnosis not present

## 2012-10-07 DIAGNOSIS — T82898A Other specified complication of vascular prosthetic devices, implants and grafts, initial encounter: Secondary | ICD-10-CM | POA: Diagnosis not present

## 2012-10-07 DIAGNOSIS — D539 Nutritional anemia, unspecified: Secondary | ICD-10-CM | POA: Diagnosis not present

## 2012-10-07 DIAGNOSIS — I120 Hypertensive chronic kidney disease with stage 5 chronic kidney disease or end stage renal disease: Secondary | ICD-10-CM | POA: Diagnosis not present

## 2012-10-07 DIAGNOSIS — N186 End stage renal disease: Secondary | ICD-10-CM | POA: Diagnosis not present

## 2012-10-09 DIAGNOSIS — T82898A Other specified complication of vascular prosthetic devices, implants and grafts, initial encounter: Secondary | ICD-10-CM | POA: Diagnosis not present

## 2012-10-09 DIAGNOSIS — N186 End stage renal disease: Secondary | ICD-10-CM | POA: Diagnosis not present

## 2012-10-09 DIAGNOSIS — I120 Hypertensive chronic kidney disease with stage 5 chronic kidney disease or end stage renal disease: Secondary | ICD-10-CM | POA: Diagnosis not present

## 2012-10-09 DIAGNOSIS — I953 Hypotension of hemodialysis: Secondary | ICD-10-CM | POA: Diagnosis not present

## 2012-10-09 DIAGNOSIS — Z992 Dependence on renal dialysis: Secondary | ICD-10-CM | POA: Diagnosis not present

## 2012-10-09 DIAGNOSIS — D539 Nutritional anemia, unspecified: Secondary | ICD-10-CM | POA: Diagnosis not present

## 2012-10-11 DIAGNOSIS — D539 Nutritional anemia, unspecified: Secondary | ICD-10-CM | POA: Diagnosis not present

## 2012-10-11 DIAGNOSIS — I953 Hypotension of hemodialysis: Secondary | ICD-10-CM | POA: Diagnosis not present

## 2012-10-11 DIAGNOSIS — N186 End stage renal disease: Secondary | ICD-10-CM | POA: Diagnosis not present

## 2012-10-11 DIAGNOSIS — I120 Hypertensive chronic kidney disease with stage 5 chronic kidney disease or end stage renal disease: Secondary | ICD-10-CM | POA: Diagnosis not present

## 2012-10-11 DIAGNOSIS — T82898A Other specified complication of vascular prosthetic devices, implants and grafts, initial encounter: Secondary | ICD-10-CM | POA: Diagnosis not present

## 2012-10-11 DIAGNOSIS — Z992 Dependence on renal dialysis: Secondary | ICD-10-CM | POA: Diagnosis not present

## 2012-10-12 DIAGNOSIS — M545 Low back pain: Secondary | ICD-10-CM | POA: Diagnosis not present

## 2012-10-12 DIAGNOSIS — J029 Acute pharyngitis, unspecified: Secondary | ICD-10-CM | POA: Diagnosis not present

## 2012-10-13 DIAGNOSIS — I953 Hypotension of hemodialysis: Secondary | ICD-10-CM | POA: Diagnosis not present

## 2012-10-13 DIAGNOSIS — N186 End stage renal disease: Secondary | ICD-10-CM | POA: Diagnosis not present

## 2012-10-13 DIAGNOSIS — I120 Hypertensive chronic kidney disease with stage 5 chronic kidney disease or end stage renal disease: Secondary | ICD-10-CM | POA: Diagnosis not present

## 2012-10-13 DIAGNOSIS — Z992 Dependence on renal dialysis: Secondary | ICD-10-CM | POA: Diagnosis not present

## 2012-10-13 DIAGNOSIS — D539 Nutritional anemia, unspecified: Secondary | ICD-10-CM | POA: Diagnosis not present

## 2012-10-13 DIAGNOSIS — T82898A Other specified complication of vascular prosthetic devices, implants and grafts, initial encounter: Secondary | ICD-10-CM | POA: Diagnosis not present

## 2012-10-14 ENCOUNTER — Emergency Department (HOSPITAL_COMMUNITY)
Admission: EM | Admit: 2012-10-14 | Discharge: 2012-10-14 | Disposition: A | Discharge status: Home / Self Care | Payer: Medicare Other | Attending: Emergency Medicine | Admitting: Emergency Medicine

## 2012-10-14 ENCOUNTER — Emergency Department (HOSPITAL_COMMUNITY): Payer: Medicare Other

## 2012-10-14 ENCOUNTER — Encounter (HOSPITAL_COMMUNITY): Payer: Self-pay | Admitting: Family Medicine

## 2012-10-14 DIAGNOSIS — L02219 Cutaneous abscess of trunk, unspecified: Secondary | ICD-10-CM | POA: Diagnosis not present

## 2012-10-14 DIAGNOSIS — Z9889 Other specified postprocedural states: Secondary | ICD-10-CM | POA: Diagnosis not present

## 2012-10-14 DIAGNOSIS — K219 Gastro-esophageal reflux disease without esophagitis: Secondary | ICD-10-CM | POA: Diagnosis not present

## 2012-10-14 DIAGNOSIS — N61 Mastitis without abscess: Secondary | ICD-10-CM | POA: Insufficient documentation

## 2012-10-14 DIAGNOSIS — Z862 Personal history of diseases of the blood and blood-forming organs and certain disorders involving the immune mechanism: Secondary | ICD-10-CM | POA: Diagnosis not present

## 2012-10-14 DIAGNOSIS — F411 Generalized anxiety disorder: Secondary | ICD-10-CM | POA: Diagnosis not present

## 2012-10-14 DIAGNOSIS — Z8739 Personal history of other diseases of the musculoskeletal system and connective tissue: Secondary | ICD-10-CM | POA: Diagnosis not present

## 2012-10-14 DIAGNOSIS — F988 Other specified behavioral and emotional disorders with onset usually occurring in childhood and adolescence: Secondary | ICD-10-CM | POA: Insufficient documentation

## 2012-10-14 DIAGNOSIS — F17200 Nicotine dependence, unspecified, uncomplicated: Secondary | ICD-10-CM | POA: Diagnosis not present

## 2012-10-14 DIAGNOSIS — L03319 Cellulitis of trunk, unspecified: Secondary | ICD-10-CM | POA: Diagnosis not present

## 2012-10-14 DIAGNOSIS — Z8669 Personal history of other diseases of the nervous system and sense organs: Secondary | ICD-10-CM | POA: Diagnosis not present

## 2012-10-14 DIAGNOSIS — F329 Major depressive disorder, single episode, unspecified: Secondary | ICD-10-CM | POA: Insufficient documentation

## 2012-10-14 DIAGNOSIS — Y832 Surgical operation with anastomosis, bypass or graft as the cause of abnormal reaction of the patient, or of later complication, without mention of misadventure at the time of the procedure: Secondary | ICD-10-CM | POA: Insufficient documentation

## 2012-10-14 DIAGNOSIS — F3289 Other specified depressive episodes: Secondary | ICD-10-CM | POA: Insufficient documentation

## 2012-10-14 DIAGNOSIS — I120 Hypertensive chronic kidney disease with stage 5 chronic kidney disease or end stage renal disease: Secondary | ICD-10-CM | POA: Insufficient documentation

## 2012-10-14 DIAGNOSIS — M25519 Pain in unspecified shoulder: Secondary | ICD-10-CM | POA: Diagnosis not present

## 2012-10-14 DIAGNOSIS — Z79899 Other long term (current) drug therapy: Secondary | ICD-10-CM | POA: Diagnosis not present

## 2012-10-14 DIAGNOSIS — Z8719 Personal history of other diseases of the digestive system: Secondary | ICD-10-CM | POA: Insufficient documentation

## 2012-10-14 DIAGNOSIS — G4730 Sleep apnea, unspecified: Secondary | ICD-10-CM | POA: Diagnosis not present

## 2012-10-14 DIAGNOSIS — Z8709 Personal history of other diseases of the respiratory system: Secondary | ICD-10-CM | POA: Diagnosis not present

## 2012-10-14 DIAGNOSIS — Z992 Dependence on renal dialysis: Secondary | ICD-10-CM | POA: Diagnosis not present

## 2012-10-14 DIAGNOSIS — M109 Gout, unspecified: Secondary | ICD-10-CM | POA: Diagnosis not present

## 2012-10-14 DIAGNOSIS — E669 Obesity, unspecified: Secondary | ICD-10-CM | POA: Diagnosis not present

## 2012-10-14 DIAGNOSIS — J45909 Unspecified asthma, uncomplicated: Secondary | ICD-10-CM | POA: Diagnosis not present

## 2012-10-14 DIAGNOSIS — S4980XA Other specified injuries of shoulder and upper arm, unspecified arm, initial encounter: Secondary | ICD-10-CM | POA: Diagnosis not present

## 2012-10-14 DIAGNOSIS — M79609 Pain in unspecified limb: Secondary | ICD-10-CM | POA: Diagnosis not present

## 2012-10-14 DIAGNOSIS — M79603 Pain in arm, unspecified: Secondary | ICD-10-CM

## 2012-10-14 DIAGNOSIS — N186 End stage renal disease: Secondary | ICD-10-CM | POA: Diagnosis not present

## 2012-10-14 DIAGNOSIS — L0291 Cutaneous abscess, unspecified: Secondary | ICD-10-CM

## 2012-10-14 DIAGNOSIS — Y929 Unspecified place or not applicable: Secondary | ICD-10-CM | POA: Insufficient documentation

## 2012-10-14 DIAGNOSIS — Y939 Activity, unspecified: Secondary | ICD-10-CM | POA: Insufficient documentation

## 2012-10-14 DIAGNOSIS — S46909A Unspecified injury of unspecified muscle, fascia and tendon at shoulder and upper arm level, unspecified arm, initial encounter: Secondary | ICD-10-CM | POA: Insufficient documentation

## 2012-10-14 DIAGNOSIS — R296 Repeated falls: Secondary | ICD-10-CM | POA: Insufficient documentation

## 2012-10-14 DIAGNOSIS — T82898A Other specified complication of vascular prosthetic devices, implants and grafts, initial encounter: Secondary | ICD-10-CM | POA: Insufficient documentation

## 2012-10-14 LAB — BASIC METABOLIC PANEL
BUN: 42 mg/dL — ABNORMAL HIGH (ref 6–23)
Chloride: 100 mEq/L (ref 96–112)
GFR calc Af Amer: 12 mL/min — ABNORMAL LOW (ref >90)
GFR calc non Af Amer: 11 mL/min — ABNORMAL LOW (ref >90)
Potassium: 2.7 mEq/L — CL (ref 3.5–5.1)
Sodium: 141 mEq/L (ref 135–145)

## 2012-10-14 MED ORDER — POTASSIUM CHLORIDE CRYS ER 20 MEQ PO TBCR: 40.0000 meq | EXTENDED_RELEASE_TABLET | Freq: Once | ORAL | Status: DC

## 2012-10-14 MED ORDER — OXYCODONE-ACETAMINOPHEN 5-325 MG PO TABS: 1.0000 | ORAL_TABLET | Freq: Four times a day (QID) | ORAL | Status: DC | PRN

## 2012-10-14 MED ORDER — OXYCODONE-ACETAMINOPHEN 5-325 MG PO TABS
1.0000 | ORAL_TABLET | Freq: Once | ORAL | Status: AC
  Administered 2012-10-14: 1 via ORAL
  Filled 2012-10-14: qty 1

## 2012-10-14 MED ORDER — DIAZEPAM 5 MG/ML IJ SOLN: 5.0000 mg | Freq: Once | INTRAMUSCULAR | Status: DC

## 2012-10-14 MED ORDER — SULFAMETHOXAZOLE-TRIMETHOPRIM 800-160 MG PO TABS: 1.0000 | ORAL_TABLET | Freq: Two times a day (BID) | ORAL | Status: DC

## 2012-10-14 NOTE — ED Provider Notes (Signed)
History     CSN: [id number]  Arrival date & time 10/14/12  1151   First MD Initiated Contact with Patient 10/14/12 1230      Chief Complaint  Patient presents with  . fistula issue   . Fall    (Consider location/radiation/quality/duration/timing/severity/associated sxs/prior treatment) HPI Comments: Patient presents with multiple complaints. Patient states that after dialysis on Tuesday (4 days ago) she was hypotensive and fell onto her right shoulder. She complains of right shoulder pain that is worse with movement. She's been taking Motrin prior to arrival without improvement. She denies numbness, tingling, or weakness in her arm. She denies neck pain. Patient also states that her left upper arm fistula has been swollen and tender. She states that she was unable to finish her previous dialysis sessions this week and did not go to dialysis today due to pressure problems. She denies fevers or redness. No nausea, vomiting, or diarrhea. She also complains of boils in her left groin and underneath her right breast. She has been on Bactrim for this the past. Onset of symptoms acute. Course is constant.  The history is provided by the patient.    Past Medical History  Diagnosis Date  . Anemia   . Depression   . Reflux   . Dizziness   . Anxiety   . ADD (attention deficit disorder)   . Asthma   . Headache   . Gout   . Anemia   . GERD (gastroesophageal reflux disease)   . Obesity   . Gastritis   . Blood transfusion 06/2011    6 units transfused at [facility]  . Arthritis     knee, wrist, back   . Bronchitis   . Shortness of breath   . Sleep apnea     does not use CPAP  . Neuromuscular disorder     carpal tunnel  . Chronic kidney disease     Atrophic left kidney, sees Dr. Hassell Done  . Hypertension     sees Dr. Wendie Agreste    Past Surgical History  Procedure Date  . Cesarean section   . Av fistula placement 06/10/11    Left brachiocephalic AVF  . Spine surgery 2004    Lumbar  diskectomy   . Microdiskectomy 04/2001    Herniated disk L5-S1  . Renal biopsy 04/2004  . Wisdom tooth extraction   . Carpel tunnel release     x 2; bilateral  . Upper gastrointestinal endoscopy   . Colonscopy   . Dilation and curettage of uterus   . Av fistula surgery 11/30/11    [facility] - redo fistula  . Hysteroscopy with thermachoice 12/10/2011    Procedure: HYSTEROSCOPY WITH THERMACHOICE;  Surgeon: Betsy Coder, MD;  Location: [facility];  Service: Gynecology;  Laterality: N/A;    Family History  Problem Relation Age of Onset  . Hypertension Mother   . Heart disease Father     Heart Disease before age 41  . Hypertension Father   . Heart attack Father   . Hypertension Daughter     History  Substance Use Topics  . Smoking status: Current Every Day Smoker -- 0.5 packs/day for 10 years    Types: Cigarettes  . Smokeless tobacco: Never Used  . Alcohol Use: Yes     Comment: ocassional    OB History    Grav Para Term Preterm Abortions TAB SAB Ect Mult Living  Review of Systems  Constitutional: Negative for fever.  HENT: Negative for sore throat and rhinorrhea.   Eyes: Negative for redness.  Respiratory: Negative for cough.   Cardiovascular: Negative for chest pain.  Gastrointestinal: Negative for nausea, vomiting, abdominal pain and diarrhea.  Genitourinary: Negative for dysuria.  Musculoskeletal: Positive for arthralgias. Negative for myalgias.  Skin: Positive for color change (boil). Negative for rash.  Neurological: Negative for headaches.    Allergies  Clarithromycin; Demerol; Fentanyl; Morphine and related; Nsaids; and Other  Home Medications   Current Outpatient Rx  Name  Route  Sig  Dispense  Refill  . ACETAMINOPHEN 500 MG PO TABS   Oral   Take 1,000 mg by mouth daily as needed. For headache         . ALBUTEROL SULFATE HFA 108 (90 BASE) MCG/ACT IN AERS   Inhalation   Inhale 2 puffs into the lungs every 6 (six) hours as needed. For  shortness of breath          . ALLOPURINOL 300 MG PO TABS   Oral   Take 300 mg by mouth daily.          Marland Kitchen ALPRAZOLAM 0.5 MG PO TABS   Oral   Take 0.5 mg by mouth 3 (three) times daily.          . BUDESONIDE-FORMOTEROL FUMARATE 160-4.5 MCG/ACT IN AERO   Inhalation   Inhale 2 puffs into the lungs 2 (two) times daily.         Marland Kitchen CALCIUM ACETATE 667 MG PO CAPS   Oral   Take 667 mg by mouth 3 (three) times daily with meals.         Marland Kitchen CITALOPRAM HYDROBROMIDE 20 MG PO TABS   Oral   Take 40 mg by mouth daily.          . CYCLOBENZAPRINE HCL 10 MG PO TABS   Oral   Take 10 mg by mouth as needed. For muscle spasms         . ESOMEPRAZOLE MAGNESIUM 40 MG PO CPDR   Oral   Take 40 mg by mouth daily.          . FUROSEMIDE 80 MG PO TABS   Oral   Take 80-160 mg by mouth daily.          . IBUPROFEN 200 MG PO TABS   Oral   Take 400 mg by mouth every 6 (six) hours as needed. For pain         . LIDOCAINE-PRILOCAINE 2.5-2.5 % EX CREA   Topical   Apply 1 application topically every Monday, Wednesday, and Friday.         Marland Kitchen MONTELUKAST SODIUM 10 MG PO TABS   Oral   Take 10 mg by mouth every morning.          Marland Kitchen RENA-VITE PO TABS   Oral   Take 1 tablet by mouth daily.         . OXYCODONE HCL 15 MG PO TABS   Oral   Take 15 mg by mouth 3 (three) times daily as needed. For pain         . PARICALCITOL 2 MCG/ML IV SOLN   Intravenous   Inject 2 mcg into the vein once a week. With every other hemodialysis treatment         . PROMETHAZINE HCL 25 MG PO TABS   Oral   Take 25 mg by mouth every 6 (six) hours as  needed. For nausea           BP 200/115  Pulse 86  Temp 98.6 F (37 C) (Oral)  Resp 20  SpO2 98%  Physical Exam  Nursing note and vitals reviewed. Constitutional: She appears well-developed and well-nourished.  HENT:  Head: Normocephalic and atraumatic.  Eyes: Conjunctivae normal are normal. Right eye exhibits no discharge. Left eye exhibits no  discharge.  Neck: Normal range of motion. Neck supple.  Cardiovascular: Normal rate, regular rhythm and normal heart sounds.   Pulmonary/Chest: Effort normal and breath sounds normal.  Abdominal: Soft. There is no tenderness.  Musculoskeletal:       Right shoulder: She exhibits decreased range of motion.       Right elbow: Normal.      Right upper arm: She exhibits tenderness. She exhibits no bony tenderness and no swelling.       Fistula with palpable thrill left upper extremity. Trace swelling of LUE compared to right. No cellulitis noted.    Neurological: She is alert.  Skin: Skin is warm and dry.       Several small nonfluctuant abscesses in left groin and under pannus. Mild cellulitis. No active draining.  Psychiatric: She has a normal mood and affect.    ED Course  Procedures (including critical care time)  Labs Reviewed  BASIC METABOLIC PANEL - Abnormal; Notable for the following:    Potassium 2.7 (*)     Glucose, Bld 172 (*)     BUN 42 (*)     Creatinine, Ser 4.72 (*)     GFR calc non Af Amer 11 (*)     GFR calc Af Amer 12 (*)     All other components within normal limits   Dg Shoulder Right  10/14/2012  *RADIOLOGY REPORT*  Clinical Data: Fall.  The right shoulder injury and pain.  RIGHT SHOULDER - 2+ VIEW  Comparison: None.  Findings: No evidence of fracture or dislocation. No other significant bone abnormality identified.  Soft tissues are unremarkable.  IMPRESSION: No acute findings.   Original Report Authenticated By: Earle Gell, M.D.      1. Shoulder pain   2. Arm pain   3. Abscess     12:38 PM Patient seen and examined. HTN noted. Work-up initiated.    Vital signs reviewed and are as follows: Filed Vitals:   10/14/12 1154  BP: 200/115  Pulse: 86  Temp: 98.6 F (37 C)  Resp: 20   Spoke with Dr. Florene Glen of nephrology. Exam and labs reviewed. Suggests this likely represents subclavian stenosis. He reccs follow-up on Monday, will need to be seen and likely  will need fistulogram.  Pt informed of results.   Will d/c with pain medication and abx for skin infection.   MDM  Shoulder pain - neg x-ray. Sling and conservative mgmt.   Fistula concern - no cellulitis, will need kidney follow-up  Missed dialysis - can resume next week. No indication for emergent dialysis.   Hypokalemia - would not treat per nephrology  HTN - no evidence of end-organ damage, does not appear fluid overloaded. Follow-up PCP/nephrology.           Carlisle Cater, [place] 10/14/12 [place], MD 10/14/12 [phone]

## 2012-10-14 NOTE — ED Notes (Signed)
Per pt sts at her last dialysis treatment she fell and hurt her right shoulder. sts also she went to have treatment today and her fistula site is warm to touch. sts unable to get treatment.

## 2012-10-14 NOTE — ED Notes (Signed)
Fell Tuesday after dialysis and hurt R arm.  Pt states fistula site is painful, swollen, and hot to touch.  Pt states she has dialysis.  Pt did not go to treatment today.

## 2012-10-14 NOTE — ED Provider Notes (Signed)
[name] is a 41 y.o. female here for multiple problems. They include problems with dialysis, this week, and right shoulder injury, and several areas of skin infection.   She is alert, cooperative. Vital signs, normal except blood pressure, high at 200/115. Right shoulder is diffusely tender. She resists motion secondary to pain. There is no deformity. Left upper arm fistula; small pustule on the venous side; no associated induration, swelling, or redness. The fistula has a good thrill.    Medical screening examination/treatment/procedure(s) were conducted as a shared visit with non-physician practitioner(s) and myself.  I personally evaluated the patient during the encounter  Richarda Blade, MD 10/14/12 1724

## 2012-10-17 ENCOUNTER — Other Ambulatory Visit: Payer: Self-pay | Admitting: *Deleted

## 2012-10-17 DIAGNOSIS — T82898A Other specified complication of vascular prosthetic devices, implants and grafts, initial encounter: Secondary | ICD-10-CM | POA: Diagnosis not present

## 2012-10-18 ENCOUNTER — Ambulatory Visit (HOSPITAL_COMMUNITY)
Admission: RE | Admit: 2012-10-18 | Discharge: 2012-10-18 | Disposition: A | Discharge status: Home / Self Care | Payer: Medicare Other | Source: Ambulatory Visit | Attending: Vascular Surgery | Admitting: Vascular Surgery

## 2012-10-18 ENCOUNTER — Encounter (HOSPITAL_COMMUNITY)
Admission: RE | Disposition: A | Discharge status: Home / Self Care | Payer: Self-pay | Source: Ambulatory Visit | Attending: Vascular Surgery

## 2012-10-18 ENCOUNTER — Encounter: Payer: Self-pay | Admitting: Neurosurgery

## 2012-10-18 ENCOUNTER — Ambulatory Visit (INDEPENDENT_AMBULATORY_CARE_PROVIDER_SITE_OTHER): Payer: Medicare Other | Admitting: Neurosurgery

## 2012-10-18 VITALS — BP 184/93 | HR 92 | Temp 97.5°F | Resp 20 | Ht 64.0 in | Wt 254.0 lb

## 2012-10-18 DIAGNOSIS — N186 End stage renal disease: Secondary | ICD-10-CM | POA: Insufficient documentation

## 2012-10-18 DIAGNOSIS — Y832 Surgical operation with anastomosis, bypass or graft as the cause of abnormal reaction of the patient, or of later complication, without mention of misadventure at the time of the procedure: Secondary | ICD-10-CM | POA: Insufficient documentation

## 2012-10-18 DIAGNOSIS — M79609 Pain in unspecified limb: Secondary | ICD-10-CM

## 2012-10-18 DIAGNOSIS — IMO0001 Reserved for inherently not codable concepts without codable children: Secondary | ICD-10-CM

## 2012-10-18 DIAGNOSIS — I120 Hypertensive chronic kidney disease with stage 5 chronic kidney disease or end stage renal disease: Secondary | ICD-10-CM | POA: Insufficient documentation

## 2012-10-18 DIAGNOSIS — T82898A Other specified complication of vascular prosthetic devices, implants and grafts, initial encounter: Secondary | ICD-10-CM

## 2012-10-18 DIAGNOSIS — T82598A Other mechanical complication of other cardiac and vascular devices and implants, initial encounter: Secondary | ICD-10-CM | POA: Insufficient documentation

## 2012-10-18 DIAGNOSIS — R609 Edema, unspecified: Secondary | ICD-10-CM | POA: Insufficient documentation

## 2012-10-18 DIAGNOSIS — Z992 Dependence on renal dialysis: Secondary | ICD-10-CM | POA: Diagnosis not present

## 2012-10-18 HISTORY — PX: SHUNTOGRAM: SHX6095

## 2012-10-18 HISTORY — PX: SHUNTOGRAM: SHX5491

## 2012-10-18 LAB — POCT I-STAT, CHEM 8
Creatinine, Ser: 5.8 mg/dL — ABNORMAL HIGH (ref 0.50–1.10)
Glucose, Bld: 94 mg/dL (ref 70–99)
Hemoglobin: 9.9 g/dL — ABNORMAL LOW (ref 12.0–15.0)
Sodium: 143 mEq/L (ref 135–145)
TCO2: 20 mmol/L (ref 0–100)

## 2012-10-18 SURGERY — ASSESSMENT, SHUNT FUNCTION, WITH CONTRAST RADIOGRAPHIC STUDY
Anesthesia: LOCAL

## 2012-10-18 MED ORDER — HYDRALAZINE HCL 20 MG/ML IJ SOLN
INTRAMUSCULAR | Status: AC
  Filled 2012-10-18: qty 1

## 2012-10-18 MED ORDER — SODIUM CHLORIDE 0.9 % IJ SOLN: 3.0000 mL | Freq: Two times a day (BID) | INTRAMUSCULAR | Status: DC

## 2012-10-18 MED ORDER — HYDROCODONE-ACETAMINOPHEN 5-500 MG PO TABS: 1.0000 | ORAL_TABLET | Freq: Four times a day (QID) | ORAL | Status: DC | PRN

## 2012-10-18 MED ORDER — ACETAMINOPHEN 325 MG PO TABS: 650.0000 mg | ORAL_TABLET | ORAL | Status: DC | PRN

## 2012-10-18 MED ORDER — SODIUM CHLORIDE 0.9 % IJ SOLN: 3.0000 mL | INTRAMUSCULAR | Status: DC | PRN

## 2012-10-18 MED ORDER — HEPARIN SODIUM (PORCINE) 1000 UNIT/ML IJ SOLN
INTRAMUSCULAR | Status: AC
  Filled 2012-10-18: qty 1

## 2012-10-18 MED ORDER — SODIUM CHLORIDE 0.9 % IV SOLN: 250.0000 mL | INTRAVENOUS | Status: DC

## 2012-10-18 MED ORDER — TRAMADOL HCL 50 MG PO TABS
50.0000 mg | ORAL_TABLET | Freq: Four times a day (QID) | ORAL | Status: DC | PRN
  Administered 2012-10-18: 50 mg via ORAL
  Filled 2012-10-18: qty 1

## 2012-10-18 MED ORDER — TRAMADOL HCL 50 MG PO TABS: 50.0000 mg | ORAL_TABLET | Freq: Four times a day (QID) | ORAL | Status: DC | PRN

## 2012-10-18 MED ORDER — ONDANSETRON HCL 4 MG/2ML IJ SOLN: 4.0000 mg | Freq: Four times a day (QID) | INTRAMUSCULAR | Status: DC | PRN

## 2012-10-18 MED ORDER — HEPARIN (PORCINE) IN NACL 2-0.9 UNIT/ML-% IJ SOLN
INTRAMUSCULAR | Status: AC
  Filled 2012-10-18: qty 500

## 2012-10-18 MED ORDER — HYDRALAZINE HCL 20 MG/ML IJ SOLN
10.0000 mg | INTRAMUSCULAR | Status: DC | PRN
  Filled 2012-10-18: qty 0.5

## 2012-10-18 MED ORDER — LIDOCAINE HCL (PF) 1 % IJ SOLN
INTRAMUSCULAR | Status: AC
  Filled 2012-10-18: qty 30

## 2012-10-18 NOTE — H&P (Signed)
[facility] [place]  Brief History and Physical  History of Present Illness  [name] is a 41 y.o. female who presents with chief complaint: malfunctioning left brachiocephalic arteriovenous fistula .  By report, excess bleeding after cannulation is occurring, raising concerns for venous stenosis.  The patient presents today for left arm fistulogram, possible intervention.    Past Medical History  Diagnosis Date  . Anemia   . Depression   . Reflux   . Dizziness   . Anxiety   . ADD (attention deficit disorder)   . Asthma   . Headache   . Gout   . Anemia   . GERD (gastroesophageal reflux disease)   . Obesity   . Gastritis   . Blood transfusion 06/2011    6 units [facility]  . Arthritis     knee, wrist, back   . Bronchitis   . Shortness of breath   . Sleep apnea     does not use CPAP  . Neuromuscular disorder     carpal tunnel  . Chronic kidney disease     Atrophic left kidney, sees Dr. Hassell Done  . Hypertension     sees Dr. Wendie Agreste    Past Surgical History  Procedure Date  . Cesarean section   . Av fistula placement 06/10/11    Left brachiocephalic AVF  . Spine surgery 2004    Lumbar diskectomy   . Microdiskectomy 04/2001    Herniated disk L5-S1  . Renal biopsy 04/2004  . Wisdom tooth extraction   . Carpel tunnel release     x 2; bilateral  . Upper gastrointestinal endoscopy   . Colonscopy   . Dilation and curettage of uterus   . Av fistula surgery 11/30/11    [facility] - redo fistula  . Hysteroscopy with thermachoice 12/10/2011    Procedure: HYSTEROSCOPY WITH THERMACHOICE;  Surgeon: Betsy Coder, MD;  Location: [facility];  Service: Gynecology;  Laterality: N/A;    History   Social History  . Marital Status: Divorced    Spouse Name: N/A    Number of Children: N/A  . Years of Education: N/A   Occupational History  . Not on file.   Social History Main Topics  . Smoking status: Current Every Day Smoker --  0.5 packs/day for 10 years    Types: Cigarettes  . Smokeless tobacco: Never Used  . Alcohol Use: Yes     Comment: ocassional  . Drug Use: No  . Sexually Active: Not Currently    Birth Control/ Protection: Condom   Other Topics Concern  . Not on file   Social History Narrative  . No narrative on file    Family History  Problem Relation Age of Onset  . Hypertension Mother   . Heart disease Father     Heart Disease before age 56  . Hypertension Father   . Heart attack Father   . Hypertension Daughter     No current facility-administered medications on file prior to encounter.   Current Outpatient Prescriptions on File Prior to Encounter  Medication Sig Dispense Refill  . acetaminophen (TYLENOL) 500 MG tablet Take 1,000 mg by mouth daily as needed. For headache      . albuterol (PROVENTIL HFA;VENTOLIN HFA) 108 (90 BASE) MCG/ACT inhaler Inhale 2 puffs into the lungs every 6 (six) hours as needed. For shortness of breath       . allopurinol (ZYLOPRIM) 300 MG tablet Take 300 mg  by mouth daily.       Marland Kitchen ALPRAZolam (XANAX) 0.5 MG tablet Take 0.5 mg by mouth 3 (three) times daily.       . budesonide-formoterol (SYMBICORT) 160-4.5 MCG/ACT inhaler Inhale 2 puffs into the lungs 2 (two) times daily.      . calcium acetate (PHOSLO) 667 MG capsule Take 667 mg by mouth 3 (three) times daily with meals.      . citalopram (CELEXA) 20 MG tablet Take 40 mg by mouth daily.       Marland Kitchen ibuprofen (ADVIL,MOTRIN) 200 MG tablet Take 400 mg by mouth every 6 (six) hours as needed. For pain      . montelukast (SINGULAIR) 10 MG tablet Take 10 mg by mouth every morning.       . multivitamin (RENA-VIT) TABS tablet Take 1 tablet by mouth daily.      Marland Kitchen oxyCODONE (ROXICODONE) 15 MG immediate release tablet Take 15 mg by mouth 3 (three) times daily as needed. For pain      . paricalcitol (ZEMPLAR) 2 MCG/ML injection Inject 2 mcg into the vein once a week. With every other hemodialysis treatment      . promethazine  (PHENERGAN) 25 MG tablet Take 25 mg by mouth every 6 (six) hours as needed. For nausea      . sulfamethoxazole-trimethoprim (SEPTRA DS) 800-160 MG per tablet Take 1 tablet by mouth every 12 (twelve) hours.  14 tablet  0  . cyclobenzaprine (FLEXERIL) 10 MG tablet Take 10 mg by mouth as needed. For muscle spasms      . esomeprazole (NEXIUM) 40 MG capsule Take 40 mg by mouth daily.       . furosemide (LASIX) 80 MG tablet Take 80-160 mg by mouth daily.       Marland Kitchen lidocaine-prilocaine (EMLA) cream Apply 1 application topically every Monday, Wednesday, and Friday.      Marland Kitchen oxyCODONE-acetaminophen (PERCOCET/ROXICET) 5-325 MG per tablet Take 1-2 tablets by mouth every 6 (six) hours as needed for pain.  12 tablet  0    Allergies  Allergen Reactions  . Clarithromycin Nausea And Vomiting  . Demerol Nausea And Vomiting    Severe headaches  . Dilaudid (Hydromorphone Hcl) Nausea And Vomiting    Headache   . Fentanyl Nausea And Vomiting  . Morphine And Related Nausea And Vomiting    Severe headache  . Nsaids     Renal failure  . Other     Seeds and nuts- has diverticulitis     Review of Systems: As listed above, otherwise negative.  Physical Examination  Filed Vitals:   10/18/12 0723  BP: 148/81  Pulse: 82  Temp: 98.3 F (36.8 C)  TempSrc: Oral  Resp: 18  Height: 5\' 7"  (1.702 m)  Weight: 250 lb (113.399 kg)  SpO2: 99%    General: A&O x 3, WDWN, obese  Pulmonary: Sym exp, good air movt, CTAB, no rales, rhonchi, & wheezing  Cardiac: RRR, Nl S1, S2, no Murmurs, rubs or gallops  Gastrointestinal: soft, NTND, -G/R, - HSM, - masses, - CVAT B  Musculoskeletal: M/S 5/5 throughout , Extremities without ischemic changes , left brachiocephalic arteriovenous fistula with strong bruit and thrill  Laboratory See iStat  Medical Decision Making  [name] is a 41 y.o. female who presents with: malfunctioning left brachiocephalic arteriovenous fistula.   The patient is scheduled  for: left arm fistulogram, possible intervention I discussed with the patient the nature of angiographic procedures, especially the limited patencies of  any endovascular intervention.  The patient is aware of that the risks of an angiographic procedure include but are not limited to: bleeding, infection, access site complications, renal failure, embolization, rupture of vessel, dissection, possible need for emergent surgical intervention, possible need for surgical procedures to treat the patient's pathology, and stroke and death.    The patient is aware of the risks and agrees to proceed.  Adele Barthel, MD [facility] [place] Office: [phone] Pager: [phone]  10/18/2012, 10:10 AM

## 2012-10-18 NOTE — Op Note (Addendum)
OPERATIVE NOTE   PROCEDURE: 1.  left brachiocephalic arteriovenous fistula cannulation under ultrasound guidance 2.  left arm fistulogram 3.  Venoplasty of cephalic vein (10 mm x 20 mm)  PRE-OPERATIVE DIAGNOSIS: Malfunctioning left arteriovenous fistula  POST-OPERATIVE DIAGNOSIS: same as above   SURGEON: Adele Barthel, MD  ANESTHESIA: local  ESTIMATED BLOOD LOSS: 5 cc  FINDING(S): 1. Tortuous patent brachiocephalic arteriovenous fistula : > 8 mm throughout most of fistula 2. Stenotic valve in proximal segment: near resolved after angioplasty 3. [id number] cephalic vein stenosis at axillary artery confluence: near resolved after angioplasty 4. Widely patent central venous structures  SPECIMEN(S):  None  CONTRAST: 45 cc  INDICATIONS: [name] is a 41 y.o. female who presents with malfunctioning left arm arteriovenous fistula.  The patient is scheduled for left arm fistulogram.  The patient is aware the risks include but are not limited to: bleeding, infection, thrombosis of the cannulated access, and possible anaphylactic reaction to the contrast.  The patient is aware of the risks of the procedure and elects to proceed forward.  DESCRIPTION: After full informed written consent was obtained, the patient was brought back to the angiography suite and placed supine upon the angiography table.  The patient was connected to monitoring equipment.  The left arm was prepped and draped in the standard fashion for a percutaneous access intervention.  Under ultrasound guidance, the left brachiocephalic arteriovenous fistula was cannulated with a micropuncture needle.  The microwire was advanced into the fistula and the needle was exchanged for the a microsheath, which was lodged 2 cm into the access.  The wire was removed and the sheath was connected to the IV extension tubing.  Hand injections were completed to image the access from the antecubitum up to the level of axilla.  The central  venous structures were also imaged by hand injections.  Based on the images, this patient will need: venoplasty of cephalic vein.  A Benson wire was advanced into the ceaphlic vein and the sheath was exchanged for a short 6-Fr sheath.  Due to the tortuousity, a KMP catheter had to be loaded over the wire.  Using this combination, I was able to get into the axillary artery without any difficulty.  Based on the the imaging, a 10 mm x 20 mm angioplasty balloon was selected.  The balloon was centered around the stenotic valve and inflated to 12 atm for 2 minute(s).  I then deflated the balloon and advanced it proximally to the cephalic's vein confluence with the axillary vein.  The balloon was centered around the stenosis and inflated to 8 atm for 2 minute(s).  On completion imaging, near resolution of the valvular stenosis occurred and nearly resolution of the prior proximal cephalic vein stenosis had occurred.  Based on the completion imaging, no further intervention is necessary.  The wire and balloon were removed from the sheath.  A 4-0 Monocryl purse-string suture was sewn around the sheath.  The sheath was removed while tying down the suture.  A sterile bandage was applied to the puncture site.  COMPLICATIONS: none  CONDITION: stable  Adele Barthel, MD [facility] [place] Office: [phone] Pager: [phone]  10/18/2012 11:59 AM

## 2012-10-18 NOTE — Progress Notes (Signed)
Subjective:     Patient ID: [name], female   DOB: [birth year], 41 y.o.   MRN: [id number]  HPI: 41 year old female patient underwent a left upper extremity fistulogram with Dr. Bridgett Larsson today. The patient causing she was having swelling and pain in her arms brought in for evaluation. Patient does have a small hematoma that was marked in the hospital and has not grown outside that marking.   Review of Systems: 12 point review of systems is notable for the difficulties described above otherwise unremarkable     Objective:   Physical Exam: Afebrile, vital signs are stable, the patient does have a hematoma along her antecubital area that was marked in the hospital in the holding area and has not grown the induration is well within the marked area. The patient is exuberantly tender around this area. There is no drainage or redness.     Assessment:     Small hematoma left antecubital area status post fistulogram    Plan:     I spoke with Dr. Scot Dock who recommended we give the patient some hydrocodone for pain. She was given 40 one by mouth every 6 hours and instructed there would be no further refills. Patient states her Ultram she was given the hospital does not work. The patient also states she is no longer in a pain clinic, and her primary care Dr. is taking care of her. The patient will followup here on when necessary basis.  Beatris Ship ANP  Clinic M.D.: Scot Dock

## 2012-10-18 NOTE — Progress Notes (Signed)
[facility] [place]  Asked to see patient in holding area for hematoma in left antecubitum after fistulogram.  On exam, minor amount of echymosis without anything more than small hematoma at cannulation site.  Patient baseline has pain issue for which she sees a chronic pain specialist.  There was no evidence of extravasation at any angioplasty sites on completion angiogram.    - Family and patient agitated.  I explained to them, she had a small hematoma that did not require any further intervention and recommended conservative measures including arm elevated.  I discussed with them that cannulation of the fistula involved needles bigger than the sheaths used in this case.    - I re-iterated to the patient and family what I had discussed with them earlier.  Her previously diagnosed general anxiety disorder is causing her and her family to compete with her care.    - I have ordered her some Ultram 50 mg (1 PO q6 hours prn pain) to help bridge her until her next pain specialist visit.  - Her fistula is ready for use at this time.  Adele Barthel, MD [facility] [place] Office: [phone] Pager: [phone]  10/18/2012, 12:45 PM

## 2012-10-19 DIAGNOSIS — N186 End stage renal disease: Secondary | ICD-10-CM | POA: Diagnosis not present

## 2012-10-19 DIAGNOSIS — Z23 Encounter for immunization: Secondary | ICD-10-CM | POA: Diagnosis not present

## 2012-10-19 DIAGNOSIS — N039 Chronic nephritic syndrome with unspecified morphologic changes: Secondary | ICD-10-CM | POA: Diagnosis not present

## 2012-10-19 DIAGNOSIS — N2581 Secondary hyperparathyroidism of renal origin: Secondary | ICD-10-CM | POA: Diagnosis not present

## 2012-10-19 DIAGNOSIS — D631 Anemia in chronic kidney disease: Secondary | ICD-10-CM | POA: Diagnosis not present

## 2012-10-21 DIAGNOSIS — N186 End stage renal disease: Secondary | ICD-10-CM | POA: Diagnosis not present

## 2012-10-21 DIAGNOSIS — N039 Chronic nephritic syndrome with unspecified morphologic changes: Secondary | ICD-10-CM | POA: Diagnosis not present

## 2012-10-21 DIAGNOSIS — Z23 Encounter for immunization: Secondary | ICD-10-CM | POA: Diagnosis not present

## 2012-10-21 DIAGNOSIS — N2581 Secondary hyperparathyroidism of renal origin: Secondary | ICD-10-CM | POA: Diagnosis not present

## 2012-10-23 DIAGNOSIS — D631 Anemia in chronic kidney disease: Secondary | ICD-10-CM | POA: Diagnosis not present

## 2012-10-23 DIAGNOSIS — N2581 Secondary hyperparathyroidism of renal origin: Secondary | ICD-10-CM | POA: Diagnosis not present

## 2012-10-23 DIAGNOSIS — N186 End stage renal disease: Secondary | ICD-10-CM | POA: Diagnosis not present

## 2012-10-23 DIAGNOSIS — Z23 Encounter for immunization: Secondary | ICD-10-CM | POA: Diagnosis not present

## 2012-10-26 DIAGNOSIS — Z23 Encounter for immunization: Secondary | ICD-10-CM | POA: Diagnosis not present

## 2012-10-26 DIAGNOSIS — N2581 Secondary hyperparathyroidism of renal origin: Secondary | ICD-10-CM | POA: Diagnosis not present

## 2012-10-26 DIAGNOSIS — D631 Anemia in chronic kidney disease: Secondary | ICD-10-CM | POA: Diagnosis not present

## 2012-10-26 DIAGNOSIS — N186 End stage renal disease: Secondary | ICD-10-CM | POA: Diagnosis not present

## 2012-10-28 DIAGNOSIS — N186 End stage renal disease: Secondary | ICD-10-CM | POA: Diagnosis not present

## 2012-10-28 DIAGNOSIS — N039 Chronic nephritic syndrome with unspecified morphologic changes: Secondary | ICD-10-CM | POA: Diagnosis not present

## 2012-10-28 DIAGNOSIS — N2581 Secondary hyperparathyroidism of renal origin: Secondary | ICD-10-CM | POA: Diagnosis not present

## 2012-10-28 DIAGNOSIS — Z23 Encounter for immunization: Secondary | ICD-10-CM | POA: Diagnosis not present

## 2012-10-30 DIAGNOSIS — D631 Anemia in chronic kidney disease: Secondary | ICD-10-CM | POA: Diagnosis not present

## 2012-10-30 DIAGNOSIS — N186 End stage renal disease: Secondary | ICD-10-CM | POA: Diagnosis not present

## 2012-10-30 DIAGNOSIS — N2581 Secondary hyperparathyroidism of renal origin: Secondary | ICD-10-CM | POA: Diagnosis not present

## 2012-10-30 DIAGNOSIS — Z23 Encounter for immunization: Secondary | ICD-10-CM | POA: Diagnosis not present

## 2012-10-31 DIAGNOSIS — N186 End stage renal disease: Secondary | ICD-10-CM | POA: Diagnosis not present

## 2012-11-06 DIAGNOSIS — Z992 Dependence on renal dialysis: Secondary | ICD-10-CM | POA: Diagnosis not present

## 2012-11-06 DIAGNOSIS — N186 End stage renal disease: Secondary | ICD-10-CM | POA: Diagnosis not present

## 2012-11-06 DIAGNOSIS — D631 Anemia in chronic kidney disease: Secondary | ICD-10-CM | POA: Diagnosis not present

## 2012-11-06 DIAGNOSIS — N039 Chronic nephritic syndrome with unspecified morphologic changes: Secondary | ICD-10-CM | POA: Diagnosis not present

## 2012-11-06 DIAGNOSIS — Z23 Encounter for immunization: Secondary | ICD-10-CM | POA: Diagnosis not present

## 2012-11-06 DIAGNOSIS — N2581 Secondary hyperparathyroidism of renal origin: Secondary | ICD-10-CM | POA: Diagnosis not present

## 2012-11-09 DIAGNOSIS — Z992 Dependence on renal dialysis: Secondary | ICD-10-CM | POA: Diagnosis not present

## 2012-11-09 DIAGNOSIS — N2581 Secondary hyperparathyroidism of renal origin: Secondary | ICD-10-CM | POA: Diagnosis not present

## 2012-11-09 DIAGNOSIS — N186 End stage renal disease: Secondary | ICD-10-CM | POA: Diagnosis not present

## 2012-11-09 DIAGNOSIS — Z23 Encounter for immunization: Secondary | ICD-10-CM | POA: Diagnosis not present

## 2012-11-09 DIAGNOSIS — D631 Anemia in chronic kidney disease: Secondary | ICD-10-CM | POA: Diagnosis not present

## 2012-11-14 DIAGNOSIS — Z23 Encounter for immunization: Secondary | ICD-10-CM | POA: Diagnosis not present

## 2012-11-14 DIAGNOSIS — N2581 Secondary hyperparathyroidism of renal origin: Secondary | ICD-10-CM | POA: Diagnosis not present

## 2012-11-14 DIAGNOSIS — N186 End stage renal disease: Secondary | ICD-10-CM | POA: Diagnosis not present

## 2012-11-14 DIAGNOSIS — D631 Anemia in chronic kidney disease: Secondary | ICD-10-CM | POA: Diagnosis not present

## 2012-11-14 DIAGNOSIS — N039 Chronic nephritic syndrome with unspecified morphologic changes: Secondary | ICD-10-CM | POA: Diagnosis not present

## 2012-11-14 DIAGNOSIS — Z992 Dependence on renal dialysis: Secondary | ICD-10-CM | POA: Diagnosis not present

## 2012-11-18 DIAGNOSIS — Z992 Dependence on renal dialysis: Secondary | ICD-10-CM | POA: Diagnosis not present

## 2012-11-18 DIAGNOSIS — N186 End stage renal disease: Secondary | ICD-10-CM | POA: Diagnosis not present

## 2012-11-18 DIAGNOSIS — N2581 Secondary hyperparathyroidism of renal origin: Secondary | ICD-10-CM | POA: Diagnosis not present

## 2012-11-18 DIAGNOSIS — D631 Anemia in chronic kidney disease: Secondary | ICD-10-CM | POA: Diagnosis not present

## 2012-11-18 DIAGNOSIS — Z23 Encounter for immunization: Secondary | ICD-10-CM | POA: Diagnosis not present

## 2012-11-23 DIAGNOSIS — Z23 Encounter for immunization: Secondary | ICD-10-CM | POA: Diagnosis not present

## 2012-11-23 DIAGNOSIS — N039 Chronic nephritic syndrome with unspecified morphologic changes: Secondary | ICD-10-CM | POA: Diagnosis not present

## 2012-11-23 DIAGNOSIS — Z992 Dependence on renal dialysis: Secondary | ICD-10-CM | POA: Diagnosis not present

## 2012-11-23 DIAGNOSIS — N2581 Secondary hyperparathyroidism of renal origin: Secondary | ICD-10-CM | POA: Diagnosis not present

## 2012-11-23 DIAGNOSIS — N186 End stage renal disease: Secondary | ICD-10-CM | POA: Diagnosis not present

## 2012-11-28 DIAGNOSIS — N039 Chronic nephritic syndrome with unspecified morphologic changes: Secondary | ICD-10-CM | POA: Diagnosis not present

## 2012-11-28 DIAGNOSIS — Z23 Encounter for immunization: Secondary | ICD-10-CM | POA: Diagnosis not present

## 2012-11-28 DIAGNOSIS — N2581 Secondary hyperparathyroidism of renal origin: Secondary | ICD-10-CM | POA: Diagnosis not present

## 2012-11-28 DIAGNOSIS — Z992 Dependence on renal dialysis: Secondary | ICD-10-CM | POA: Diagnosis not present

## 2012-11-28 DIAGNOSIS — N186 End stage renal disease: Secondary | ICD-10-CM | POA: Diagnosis not present

## 2012-12-01 DIAGNOSIS — M999 Biomechanical lesion, unspecified: Secondary | ICD-10-CM | POA: Diagnosis not present

## 2012-12-01 DIAGNOSIS — M9981 Other biomechanical lesions of cervical region: Secondary | ICD-10-CM | POA: Diagnosis not present

## 2012-12-01 DIAGNOSIS — N186 End stage renal disease: Secondary | ICD-10-CM | POA: Diagnosis not present

## 2012-12-02 DIAGNOSIS — D631 Anemia in chronic kidney disease: Secondary | ICD-10-CM | POA: Diagnosis not present

## 2012-12-02 DIAGNOSIS — D509 Iron deficiency anemia, unspecified: Secondary | ICD-10-CM | POA: Diagnosis not present

## 2012-12-02 DIAGNOSIS — Z23 Encounter for immunization: Secondary | ICD-10-CM | POA: Diagnosis not present

## 2012-12-02 DIAGNOSIS — N2581 Secondary hyperparathyroidism of renal origin: Secondary | ICD-10-CM | POA: Diagnosis not present

## 2012-12-02 DIAGNOSIS — N186 End stage renal disease: Secondary | ICD-10-CM | POA: Diagnosis not present

## 2012-12-30 DIAGNOSIS — N186 End stage renal disease: Secondary | ICD-10-CM | POA: Diagnosis not present

## 2013-01-08 DIAGNOSIS — B300 Keratoconjunctivitis due to adenovirus: Secondary | ICD-10-CM | POA: Diagnosis not present

## 2013-01-09 DIAGNOSIS — N186 End stage renal disease: Secondary | ICD-10-CM | POA: Diagnosis not present

## 2013-01-11 DIAGNOSIS — N186 End stage renal disease: Secondary | ICD-10-CM | POA: Diagnosis not present

## 2013-01-11 DIAGNOSIS — Z992 Dependence on renal dialysis: Secondary | ICD-10-CM | POA: Diagnosis not present

## 2013-01-11 DIAGNOSIS — N2581 Secondary hyperparathyroidism of renal origin: Secondary | ICD-10-CM | POA: Diagnosis not present

## 2013-01-17 DIAGNOSIS — N186 End stage renal disease: Secondary | ICD-10-CM | POA: Diagnosis not present

## 2013-01-17 DIAGNOSIS — D509 Iron deficiency anemia, unspecified: Secondary | ICD-10-CM | POA: Diagnosis not present

## 2013-01-17 DIAGNOSIS — N2581 Secondary hyperparathyroidism of renal origin: Secondary | ICD-10-CM | POA: Diagnosis not present

## 2013-01-24 DIAGNOSIS — D509 Iron deficiency anemia, unspecified: Secondary | ICD-10-CM | POA: Diagnosis not present

## 2013-01-24 DIAGNOSIS — N2581 Secondary hyperparathyroidism of renal origin: Secondary | ICD-10-CM | POA: Diagnosis not present

## 2013-01-24 DIAGNOSIS — Z992 Dependence on renal dialysis: Secondary | ICD-10-CM | POA: Diagnosis not present

## 2013-01-24 DIAGNOSIS — N186 End stage renal disease: Secondary | ICD-10-CM | POA: Diagnosis not present

## 2013-01-25 DIAGNOSIS — N186 End stage renal disease: Secondary | ICD-10-CM | POA: Diagnosis not present

## 2013-01-25 DIAGNOSIS — N2581 Secondary hyperparathyroidism of renal origin: Secondary | ICD-10-CM | POA: Diagnosis not present

## 2013-01-25 DIAGNOSIS — Z992 Dependence on renal dialysis: Secondary | ICD-10-CM | POA: Diagnosis not present

## 2013-01-25 DIAGNOSIS — D509 Iron deficiency anemia, unspecified: Secondary | ICD-10-CM | POA: Diagnosis not present

## 2013-01-26 DIAGNOSIS — M9981 Other biomechanical lesions of cervical region: Secondary | ICD-10-CM | POA: Diagnosis not present

## 2013-01-26 DIAGNOSIS — M999 Biomechanical lesion, unspecified: Secondary | ICD-10-CM | POA: Diagnosis not present

## 2013-01-29 DIAGNOSIS — N186 End stage renal disease: Secondary | ICD-10-CM | POA: Diagnosis not present

## 2013-02-01 DIAGNOSIS — N2581 Secondary hyperparathyroidism of renal origin: Secondary | ICD-10-CM | POA: Diagnosis not present

## 2013-02-01 DIAGNOSIS — E876 Hypokalemia: Secondary | ICD-10-CM | POA: Diagnosis not present

## 2013-02-01 DIAGNOSIS — D631 Anemia in chronic kidney disease: Secondary | ICD-10-CM | POA: Diagnosis not present

## 2013-02-01 DIAGNOSIS — N186 End stage renal disease: Secondary | ICD-10-CM | POA: Diagnosis not present

## 2013-02-01 DIAGNOSIS — Z992 Dependence on renal dialysis: Secondary | ICD-10-CM | POA: Diagnosis not present

## 2013-02-03 DIAGNOSIS — D631 Anemia in chronic kidney disease: Secondary | ICD-10-CM | POA: Diagnosis not present

## 2013-02-03 DIAGNOSIS — E876 Hypokalemia: Secondary | ICD-10-CM | POA: Diagnosis not present

## 2013-02-03 DIAGNOSIS — N186 End stage renal disease: Secondary | ICD-10-CM | POA: Diagnosis not present

## 2013-02-03 DIAGNOSIS — Z992 Dependence on renal dialysis: Secondary | ICD-10-CM | POA: Diagnosis not present

## 2013-02-03 DIAGNOSIS — N2581 Secondary hyperparathyroidism of renal origin: Secondary | ICD-10-CM | POA: Diagnosis not present

## 2013-02-07 DIAGNOSIS — N039 Chronic nephritic syndrome with unspecified morphologic changes: Secondary | ICD-10-CM | POA: Diagnosis not present

## 2013-02-07 DIAGNOSIS — N2581 Secondary hyperparathyroidism of renal origin: Secondary | ICD-10-CM | POA: Diagnosis not present

## 2013-02-07 DIAGNOSIS — M9981 Other biomechanical lesions of cervical region: Secondary | ICD-10-CM | POA: Diagnosis not present

## 2013-02-07 DIAGNOSIS — M999 Biomechanical lesion, unspecified: Secondary | ICD-10-CM | POA: Diagnosis not present

## 2013-02-07 DIAGNOSIS — N186 End stage renal disease: Secondary | ICD-10-CM | POA: Diagnosis not present

## 2013-02-07 DIAGNOSIS — Z992 Dependence on renal dialysis: Secondary | ICD-10-CM | POA: Diagnosis not present

## 2013-02-07 DIAGNOSIS — E876 Hypokalemia: Secondary | ICD-10-CM | POA: Diagnosis not present

## 2013-02-08 DIAGNOSIS — N186 End stage renal disease: Secondary | ICD-10-CM | POA: Diagnosis not present

## 2013-02-08 DIAGNOSIS — Z992 Dependence on renal dialysis: Secondary | ICD-10-CM | POA: Diagnosis not present

## 2013-02-08 DIAGNOSIS — N039 Chronic nephritic syndrome with unspecified morphologic changes: Secondary | ICD-10-CM | POA: Diagnosis not present

## 2013-02-08 DIAGNOSIS — N2581 Secondary hyperparathyroidism of renal origin: Secondary | ICD-10-CM | POA: Diagnosis not present

## 2013-02-08 DIAGNOSIS — E876 Hypokalemia: Secondary | ICD-10-CM | POA: Diagnosis not present

## 2013-02-10 DIAGNOSIS — N186 End stage renal disease: Secondary | ICD-10-CM | POA: Diagnosis not present

## 2013-02-10 DIAGNOSIS — E876 Hypokalemia: Secondary | ICD-10-CM | POA: Diagnosis not present

## 2013-02-10 DIAGNOSIS — D631 Anemia in chronic kidney disease: Secondary | ICD-10-CM | POA: Diagnosis not present

## 2013-02-10 DIAGNOSIS — N2581 Secondary hyperparathyroidism of renal origin: Secondary | ICD-10-CM | POA: Diagnosis not present

## 2013-02-10 DIAGNOSIS — N039 Chronic nephritic syndrome with unspecified morphologic changes: Secondary | ICD-10-CM | POA: Diagnosis not present

## 2013-02-10 DIAGNOSIS — Z992 Dependence on renal dialysis: Secondary | ICD-10-CM | POA: Diagnosis not present

## 2013-02-14 DIAGNOSIS — M9981 Other biomechanical lesions of cervical region: Secondary | ICD-10-CM | POA: Diagnosis not present

## 2013-02-14 DIAGNOSIS — M999 Biomechanical lesion, unspecified: Secondary | ICD-10-CM | POA: Diagnosis not present

## 2013-02-14 DIAGNOSIS — J45901 Unspecified asthma with (acute) exacerbation: Secondary | ICD-10-CM | POA: Diagnosis not present

## 2013-02-15 DIAGNOSIS — N186 End stage renal disease: Secondary | ICD-10-CM | POA: Diagnosis not present

## 2013-02-15 DIAGNOSIS — N039 Chronic nephritic syndrome with unspecified morphologic changes: Secondary | ICD-10-CM | POA: Diagnosis not present

## 2013-02-15 DIAGNOSIS — N2581 Secondary hyperparathyroidism of renal origin: Secondary | ICD-10-CM | POA: Diagnosis not present

## 2013-02-15 DIAGNOSIS — Z992 Dependence on renal dialysis: Secondary | ICD-10-CM | POA: Diagnosis not present

## 2013-02-15 DIAGNOSIS — E876 Hypokalemia: Secondary | ICD-10-CM | POA: Diagnosis not present

## 2013-02-16 DIAGNOSIS — E876 Hypokalemia: Secondary | ICD-10-CM | POA: Diagnosis not present

## 2013-02-16 DIAGNOSIS — D631 Anemia in chronic kidney disease: Secondary | ICD-10-CM | POA: Diagnosis not present

## 2013-02-16 DIAGNOSIS — Z992 Dependence on renal dialysis: Secondary | ICD-10-CM | POA: Diagnosis not present

## 2013-02-16 DIAGNOSIS — N2581 Secondary hyperparathyroidism of renal origin: Secondary | ICD-10-CM | POA: Diagnosis not present

## 2013-02-16 DIAGNOSIS — N186 End stage renal disease: Secondary | ICD-10-CM | POA: Diagnosis not present

## 2013-02-17 DIAGNOSIS — N2581 Secondary hyperparathyroidism of renal origin: Secondary | ICD-10-CM | POA: Diagnosis not present

## 2013-02-17 DIAGNOSIS — E876 Hypokalemia: Secondary | ICD-10-CM | POA: Diagnosis not present

## 2013-02-17 DIAGNOSIS — N186 End stage renal disease: Secondary | ICD-10-CM | POA: Diagnosis not present

## 2013-02-17 DIAGNOSIS — Z992 Dependence on renal dialysis: Secondary | ICD-10-CM | POA: Diagnosis not present

## 2013-02-17 DIAGNOSIS — D631 Anemia in chronic kidney disease: Secondary | ICD-10-CM | POA: Diagnosis not present

## 2013-02-20 DIAGNOSIS — Z992 Dependence on renal dialysis: Secondary | ICD-10-CM | POA: Diagnosis not present

## 2013-02-20 DIAGNOSIS — E876 Hypokalemia: Secondary | ICD-10-CM | POA: Diagnosis not present

## 2013-02-20 DIAGNOSIS — N2581 Secondary hyperparathyroidism of renal origin: Secondary | ICD-10-CM | POA: Diagnosis not present

## 2013-02-20 DIAGNOSIS — N039 Chronic nephritic syndrome with unspecified morphologic changes: Secondary | ICD-10-CM | POA: Diagnosis not present

## 2013-02-20 DIAGNOSIS — N186 End stage renal disease: Secondary | ICD-10-CM | POA: Diagnosis not present

## 2013-02-22 DIAGNOSIS — N039 Chronic nephritic syndrome with unspecified morphologic changes: Secondary | ICD-10-CM | POA: Diagnosis not present

## 2013-02-22 DIAGNOSIS — N186 End stage renal disease: Secondary | ICD-10-CM | POA: Diagnosis not present

## 2013-02-22 DIAGNOSIS — E876 Hypokalemia: Secondary | ICD-10-CM | POA: Diagnosis not present

## 2013-02-22 DIAGNOSIS — N2581 Secondary hyperparathyroidism of renal origin: Secondary | ICD-10-CM | POA: Diagnosis not present

## 2013-02-22 DIAGNOSIS — Z992 Dependence on renal dialysis: Secondary | ICD-10-CM | POA: Diagnosis not present

## 2013-02-23 DIAGNOSIS — M5137 Other intervertebral disc degeneration, lumbosacral region: Secondary | ICD-10-CM | POA: Diagnosis not present

## 2013-02-23 DIAGNOSIS — IMO0002 Reserved for concepts with insufficient information to code with codable children: Secondary | ICD-10-CM | POA: Diagnosis not present

## 2013-02-23 DIAGNOSIS — Z5181 Encounter for therapeutic drug level monitoring: Secondary | ICD-10-CM | POA: Diagnosis not present

## 2013-02-23 DIAGNOSIS — F17200 Nicotine dependence, unspecified, uncomplicated: Secondary | ICD-10-CM | POA: Diagnosis not present

## 2013-02-23 DIAGNOSIS — G894 Chronic pain syndrome: Secondary | ICD-10-CM | POA: Diagnosis not present

## 2013-02-23 DIAGNOSIS — Z79899 Other long term (current) drug therapy: Secondary | ICD-10-CM | POA: Diagnosis not present

## 2013-02-23 DIAGNOSIS — M47817 Spondylosis without myelopathy or radiculopathy, lumbosacral region: Secondary | ICD-10-CM | POA: Diagnosis not present

## 2013-02-28 DIAGNOSIS — N186 End stage renal disease: Secondary | ICD-10-CM | POA: Diagnosis not present

## 2013-03-03 DIAGNOSIS — Z992 Dependence on renal dialysis: Secondary | ICD-10-CM | POA: Diagnosis not present

## 2013-03-03 DIAGNOSIS — D631 Anemia in chronic kidney disease: Secondary | ICD-10-CM | POA: Diagnosis not present

## 2013-03-03 DIAGNOSIS — N186 End stage renal disease: Secondary | ICD-10-CM | POA: Diagnosis not present

## 2013-03-03 DIAGNOSIS — N039 Chronic nephritic syndrome with unspecified morphologic changes: Secondary | ICD-10-CM | POA: Diagnosis not present

## 2013-03-03 DIAGNOSIS — N2581 Secondary hyperparathyroidism of renal origin: Secondary | ICD-10-CM | POA: Diagnosis not present

## 2013-03-03 DIAGNOSIS — D509 Iron deficiency anemia, unspecified: Secondary | ICD-10-CM | POA: Diagnosis not present

## 2013-03-06 DIAGNOSIS — Z992 Dependence on renal dialysis: Secondary | ICD-10-CM | POA: Diagnosis not present

## 2013-03-06 DIAGNOSIS — D509 Iron deficiency anemia, unspecified: Secondary | ICD-10-CM | POA: Diagnosis not present

## 2013-03-06 DIAGNOSIS — N186 End stage renal disease: Secondary | ICD-10-CM | POA: Diagnosis not present

## 2013-03-06 DIAGNOSIS — N2581 Secondary hyperparathyroidism of renal origin: Secondary | ICD-10-CM | POA: Diagnosis not present

## 2013-03-06 DIAGNOSIS — N039 Chronic nephritic syndrome with unspecified morphologic changes: Secondary | ICD-10-CM | POA: Diagnosis not present

## 2013-03-14 DIAGNOSIS — N186 End stage renal disease: Secondary | ICD-10-CM | POA: Diagnosis not present

## 2013-03-14 DIAGNOSIS — N2581 Secondary hyperparathyroidism of renal origin: Secondary | ICD-10-CM | POA: Diagnosis not present

## 2013-03-14 DIAGNOSIS — D631 Anemia in chronic kidney disease: Secondary | ICD-10-CM | POA: Diagnosis not present

## 2013-03-14 DIAGNOSIS — D509 Iron deficiency anemia, unspecified: Secondary | ICD-10-CM | POA: Diagnosis not present

## 2013-03-14 DIAGNOSIS — Z992 Dependence on renal dialysis: Secondary | ICD-10-CM | POA: Diagnosis not present

## 2013-03-16 DIAGNOSIS — N2581 Secondary hyperparathyroidism of renal origin: Secondary | ICD-10-CM | POA: Diagnosis not present

## 2013-03-16 DIAGNOSIS — N186 End stage renal disease: Secondary | ICD-10-CM | POA: Diagnosis not present

## 2013-03-16 DIAGNOSIS — Z992 Dependence on renal dialysis: Secondary | ICD-10-CM | POA: Diagnosis not present

## 2013-03-16 DIAGNOSIS — N039 Chronic nephritic syndrome with unspecified morphologic changes: Secondary | ICD-10-CM | POA: Diagnosis not present

## 2013-03-16 DIAGNOSIS — D509 Iron deficiency anemia, unspecified: Secondary | ICD-10-CM | POA: Diagnosis not present

## 2013-03-17 DIAGNOSIS — N186 End stage renal disease: Secondary | ICD-10-CM | POA: Diagnosis not present

## 2013-03-17 DIAGNOSIS — N039 Chronic nephritic syndrome with unspecified morphologic changes: Secondary | ICD-10-CM | POA: Diagnosis not present

## 2013-03-17 DIAGNOSIS — D509 Iron deficiency anemia, unspecified: Secondary | ICD-10-CM | POA: Diagnosis not present

## 2013-03-17 DIAGNOSIS — Z992 Dependence on renal dialysis: Secondary | ICD-10-CM | POA: Diagnosis not present

## 2013-03-17 DIAGNOSIS — N2581 Secondary hyperparathyroidism of renal origin: Secondary | ICD-10-CM | POA: Diagnosis not present

## 2013-03-24 DIAGNOSIS — N2581 Secondary hyperparathyroidism of renal origin: Secondary | ICD-10-CM | POA: Diagnosis not present

## 2013-03-24 DIAGNOSIS — D509 Iron deficiency anemia, unspecified: Secondary | ICD-10-CM | POA: Diagnosis not present

## 2013-03-24 DIAGNOSIS — N186 End stage renal disease: Secondary | ICD-10-CM | POA: Diagnosis not present

## 2013-03-24 DIAGNOSIS — D631 Anemia in chronic kidney disease: Secondary | ICD-10-CM | POA: Diagnosis not present

## 2013-03-24 DIAGNOSIS — Z992 Dependence on renal dialysis: Secondary | ICD-10-CM | POA: Diagnosis not present

## 2013-03-28 DIAGNOSIS — E119 Type 2 diabetes mellitus without complications: Secondary | ICD-10-CM | POA: Diagnosis not present

## 2013-03-28 DIAGNOSIS — E785 Hyperlipidemia, unspecified: Secondary | ICD-10-CM | POA: Diagnosis not present

## 2013-03-28 DIAGNOSIS — F411 Generalized anxiety disorder: Secondary | ICD-10-CM | POA: Diagnosis not present

## 2013-03-28 DIAGNOSIS — I10 Essential (primary) hypertension: Secondary | ICD-10-CM | POA: Diagnosis not present

## 2013-03-29 DIAGNOSIS — Z992 Dependence on renal dialysis: Secondary | ICD-10-CM | POA: Diagnosis not present

## 2013-03-29 DIAGNOSIS — D631 Anemia in chronic kidney disease: Secondary | ICD-10-CM | POA: Diagnosis not present

## 2013-03-29 DIAGNOSIS — N186 End stage renal disease: Secondary | ICD-10-CM | POA: Diagnosis not present

## 2013-03-29 DIAGNOSIS — D509 Iron deficiency anemia, unspecified: Secondary | ICD-10-CM | POA: Diagnosis not present

## 2013-03-29 DIAGNOSIS — N2581 Secondary hyperparathyroidism of renal origin: Secondary | ICD-10-CM | POA: Diagnosis not present

## 2013-03-30 ENCOUNTER — Other Ambulatory Visit: Payer: Self-pay | Admitting: *Deleted

## 2013-03-30 DIAGNOSIS — T82598A Other mechanical complication of other cardiac and vascular devices and implants, initial encounter: Secondary | ICD-10-CM

## 2013-03-31 DIAGNOSIS — N186 End stage renal disease: Secondary | ICD-10-CM | POA: Diagnosis not present

## 2013-04-04 DIAGNOSIS — D509 Iron deficiency anemia, unspecified: Secondary | ICD-10-CM | POA: Diagnosis not present

## 2013-04-04 DIAGNOSIS — N2581 Secondary hyperparathyroidism of renal origin: Secondary | ICD-10-CM | POA: Diagnosis not present

## 2013-04-04 DIAGNOSIS — Z23 Encounter for immunization: Secondary | ICD-10-CM | POA: Diagnosis not present

## 2013-04-04 DIAGNOSIS — N039 Chronic nephritic syndrome with unspecified morphologic changes: Secondary | ICD-10-CM | POA: Diagnosis not present

## 2013-04-04 DIAGNOSIS — D631 Anemia in chronic kidney disease: Secondary | ICD-10-CM | POA: Diagnosis not present

## 2013-04-04 DIAGNOSIS — Z992 Dependence on renal dialysis: Secondary | ICD-10-CM | POA: Diagnosis not present

## 2013-04-04 DIAGNOSIS — N186 End stage renal disease: Secondary | ICD-10-CM | POA: Diagnosis not present

## 2013-04-05 ENCOUNTER — Telehealth: Payer: Self-pay | Admitting: Surgery

## 2013-04-05 DIAGNOSIS — N2581 Secondary hyperparathyroidism of renal origin: Secondary | ICD-10-CM | POA: Diagnosis not present

## 2013-04-05 DIAGNOSIS — N186 End stage renal disease: Secondary | ICD-10-CM | POA: Diagnosis not present

## 2013-04-05 DIAGNOSIS — N039 Chronic nephritic syndrome with unspecified morphologic changes: Secondary | ICD-10-CM | POA: Diagnosis not present

## 2013-04-05 DIAGNOSIS — Z23 Encounter for immunization: Secondary | ICD-10-CM | POA: Diagnosis not present

## 2013-04-05 DIAGNOSIS — Z992 Dependence on renal dialysis: Secondary | ICD-10-CM | POA: Diagnosis not present

## 2013-04-05 DIAGNOSIS — D509 Iron deficiency anemia, unspecified: Secondary | ICD-10-CM | POA: Diagnosis not present

## 2013-04-05 NOTE — Telephone Encounter (Addendum)
Message copied by Gena Fray on Thu Apr 05, 2013 11:59 AM ------      Message from: Denman George      Created: Thu Apr 05, 2013 10:30 AM      Regarding: Dr. Hassell Done requests appt. for pt. 04/09/13       Amber from [employer]. Called.  Pt. Had episode of bleeding from her aneurysm left AVF last night.  Dr. Hassell Done would like her to be seen on Monday.  Can you see any options?  Call Amber @ [phone]. ------  Spoke with Amber at [facility], dpm

## 2013-04-06 ENCOUNTER — Encounter: Payer: Self-pay | Admitting: Surgery

## 2013-04-06 DIAGNOSIS — G5700 Lesion of sciatic nerve, unspecified lower limb: Secondary | ICD-10-CM | POA: Diagnosis not present

## 2013-04-06 DIAGNOSIS — M47817 Spondylosis without myelopathy or radiculopathy, lumbosacral region: Secondary | ICD-10-CM | POA: Diagnosis not present

## 2013-04-06 DIAGNOSIS — M5137 Other intervertebral disc degeneration, lumbosacral region: Secondary | ICD-10-CM | POA: Diagnosis not present

## 2013-04-06 DIAGNOSIS — G894 Chronic pain syndrome: Secondary | ICD-10-CM | POA: Diagnosis not present

## 2013-04-09 ENCOUNTER — Encounter: Payer: Self-pay | Admitting: Surgery

## 2013-04-09 ENCOUNTER — Ambulatory Visit (INDEPENDENT_AMBULATORY_CARE_PROVIDER_SITE_OTHER): Payer: Medicare Other | Admitting: Surgery

## 2013-04-09 ENCOUNTER — Encounter (INDEPENDENT_AMBULATORY_CARE_PROVIDER_SITE_OTHER): Payer: Medicare Other | Admitting: Vascular Surgery

## 2013-04-09 VITALS — BP 146/93 | HR 62 | Resp 16 | Ht 64.0 in | Wt 255.0 lb

## 2013-04-09 DIAGNOSIS — R234 Changes in skin texture: Secondary | ICD-10-CM | POA: Insufficient documentation

## 2013-04-09 DIAGNOSIS — N186 End stage renal disease: Secondary | ICD-10-CM | POA: Diagnosis not present

## 2013-04-09 DIAGNOSIS — T82598A Other mechanical complication of other cardiac and vascular devices and implants, initial encounter: Secondary | ICD-10-CM | POA: Diagnosis not present

## 2013-04-09 DIAGNOSIS — L988 Other specified disorders of the skin and subcutaneous tissue: Secondary | ICD-10-CM | POA: Diagnosis not present

## 2013-04-09 NOTE — Progress Notes (Signed)
[facility] [place]   Patient name: [name] MRN: [id number] DOB: 1971/06/22 Sex: female     Chief Complaint  Patient presents with  . Esrd    Left AVF upper arm  thinning  skin  bleeds off/on excessive 4-5 X's last 2-3 months.  HD:  T-Thur-S    HISTORY OF PRESENT ILLNESS: The patient comes in today for evaluation of the skin bending over her left upper arm AV fistula. This has been present for several months. She states that she has had bleeding issues for 5 times over the past one to 2 months. The patient has a history of left brachiocephalic fistula. She is also undergone superficialization and branch ligation. In December of 2013 she had a venoplasty of her cephalic vein and the axillary confluence.  The patient dialyzes on Tuesday Thursday Saturday.  Past Medical History  Diagnosis Date  . Anemia   . Depression   . Reflux   . Dizziness   . Anxiety   . ADD (attention deficit disorder)   . Asthma   . Headache(784.0)   . Gout   . Anemia   . GERD (gastroesophageal reflux disease)   . Obesity   . Gastritis   . Blood transfusion 06/2011    6 units transfused at [facility]  . Arthritis     knee, wrist, back   . Bronchitis   . Shortness of breath   . Sleep apnea     does not use CPAP  . Neuromuscular disorder     carpal tunnel  . Chronic kidney disease     Atrophic left kidney, sees Dr. Hassell Done  . Hypertension     sees Dr. Wendie Agreste    Past Surgical History  Procedure Laterality Date  . Cesarean section    . Spine surgery  2004    Lumbar diskectomy   . Microdiskectomy  04/2001    Herniated disk L5-S1  . Renal biopsy  04/2004  . Wisdom tooth extraction    . Carpel tunnel release      x 2; bilateral  . Upper gastrointestinal endoscopy    . Colonscopy    . Dilation and curettage of uterus    . Av fistula surgery  11/30/11    [facility] Pont[facility] - redo fistula  . Hysteroscopy with thermachoice  12/10/2011    Procedure: HYSTEROSCOPY WITH  THERMACHOICE;  Surgeon: Betsy Coder, MD;  Location: [facility];  Service: Gynecology;  Laterality: N/A;  . Shuntogram  Dec. 18, 2013    Left arm  . Av fistula placement  06/10/11    Left brachiocephalic AVF    History   Social History  . Marital Status: Divorced    Spouse Name: N/A    Number of Children: N/A  . Years of Education: N/A   Occupational History  . Not on file.   Social History Main Topics  . Smoking status: Current Every Day Smoker -- 0.50 packs/day for 10 years    Types: Cigarettes  . Smokeless tobacco: Never Used  . Alcohol Use: Yes     Comment: ocassional  . Drug Use: No  . Sexually Active: Not Currently    Birth Control/ Protection: Condom   Other Topics Concern  . Not on file   Social History Narrative  . No narrative on file    Family History  Problem Relation Age of Onset  . Hypertension Mother   . Heart disease Father     Heart Disease  before age 27  . Hypertension Father   . Heart attack Father   . Hypertension Daughter     Allergies as of 04/09/2013 - Review Complete 04/09/2013  Allergen Reaction Noted  . Clarithromycin Nausea And Vomiting 06/23/2011  . Demerol Nausea And Vomiting 09/27/2011  . Dilaudid (hydromorphone hcl) Nausea And Vomiting 10/18/2012  . Fentanyl Nausea And Vomiting 12/20/2011  . Morphine and related Nausea And Vomiting 06/23/2011  . Nsaids  11/29/2011  . Other  10/18/2011    Current Outpatient Prescriptions on File Prior to Visit  Medication Sig Dispense Refill  . acetaminophen (TYLENOL) 500 MG tablet Take 1,000 mg by mouth daily as needed. For headache      . albuterol (PROVENTIL HFA;VENTOLIN HFA) 108 (90 BASE) MCG/ACT inhaler Inhale 2 puffs into the lungs every 6 (six) hours as needed. For shortness of breath       . allopurinol (ZYLOPRIM) 300 MG tablet Take 300 mg by mouth daily.       Marland Kitchen ALPRAZolam (XANAX) 0.5 MG tablet Take 0.5 mg by mouth 3 (three) times daily.       . budesonide-formoterol (SYMBICORT)  160-4.5 MCG/ACT inhaler Inhale 2 puffs into the lungs 2 (two) times daily.      . calcium acetate (PHOSLO) 667 MG capsule Take 667 mg by mouth 3 (three) times daily with meals.      . citalopram (CELEXA) 20 MG tablet Take 40 mg by mouth daily.       . cyclobenzaprine (FLEXERIL) 10 MG tablet Take 10 mg by mouth as needed. For muscle spasms      . esomeprazole (NEXIUM) 40 MG capsule Take 40 mg by mouth daily.       . furosemide (LASIX) 80 MG tablet Take 80-160 mg by mouth daily.       Marland Kitchen HYDROcodone-acetaminophen (VICODIN) 5-500 MG per tablet Take 1 tablet by mouth every 6 (six) hours as needed for pain.  40 tablet  0  . ibuprofen (ADVIL,MOTRIN) 200 MG tablet Take 400 mg by mouth every 6 (six) hours as needed. For pain      . lidocaine-prilocaine (EMLA) cream Apply 1 application topically every Monday, Wednesday, and Friday.      . montelukast (SINGULAIR) 10 MG tablet Take 10 mg by mouth every morning.       . multivitamin (RENA-VIT) TABS tablet Take 1 tablet by mouth daily.      . paricalcitol (ZEMPLAR) 2 MCG/ML injection Inject 2 mcg into the vein once a week. With every other hemodialysis treatment      . promethazine (PHENERGAN) 25 MG tablet Take 25 mg by mouth every 6 (six) hours as needed. For nausea      . sulfamethoxazole-trimethoprim (SEPTRA DS) 800-160 MG per tablet Take 1 tablet by mouth every 12 (twelve) hours.  14 tablet  0  . traMADol (ULTRAM) 50 MG tablet Take 1 tablet (50 mg total) by mouth every 6 (six) hours as needed.  30 tablet  0  . oxyCODONE (ROXICODONE) 15 MG immediate release tablet Take 15 mg by mouth 3 (three) times daily as needed. For pain      . oxyCODONE-acetaminophen (PERCOCET/ROXICET) 5-325 MG per tablet Take 1-2 tablets by mouth every 6 (six) hours as needed for pain.  12 tablet  0   No current facility-administered medications on file prior to visit.     REVIEW OF SYSTEMS: Cardiovascular: No chest pain, chest pressure, palpitations, orthopnea, or dyspnea on  exertion. No claudication or rest pain,  No history of DVT or phlebitis. Pulmonary: No productive cough, asthma or wheezing. Neurologic: No weakness, paresthesias, aphasia, or amaurosis. No dizziness. Hematologic: No bleeding problems or clotting disorders. Musculoskeletal: No joint pain or joint swelling. Gastrointestinal: No blood in stool or hematemesis Genitourinary: No dysuria or hematuria. Psychiatric:: No history of major depression. Integumentary: No rashes or ulcers. Constitutional: No fever or chills.  PHYSICAL EXAMINATION:   Vital signs are BP 146/93  Pulse 62  Resp 16  Ht 5\' 4"  (1.626 m)  Wt 255 lb (115.667 kg)  BMI 43.75 kg/m2  SpO2 98% General: The patient appears their stated age. HEENT:  No gross abnormalities Pulmonary:  Non labored breathing Musculoskeletal: There are no major deformities. Neurologic: No focal weakness or paresthesias are detected, Skin: There are no ulcer or rashes noted. Psychiatric: The patient has normal affect. Cardiovascular: There is a regular rate and rhythm without significant murmur appreciated. Aneurysmal fistula with punctate area in the upper arm which appears flat. There is no full-thickness skin breakdown however it is slightly thinned out over this area   Diagnostic Studies Fistula ultrasound today shows elevated velocities in the proximal cephalic vein in the shoulder area.  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 This is been scheduled for June 18. If no stenosis is identified. The patient did have surgical excision of this area. She also wants to have the very aneurysmal portion of the fistula resected near her antecubital crease. I will make further recommendations after a fistulogram.  V. Leia Alf, M.D. [facility] Office: [phone] Pager:   [phone]

## 2013-04-10 DIAGNOSIS — Z23 Encounter for immunization: Secondary | ICD-10-CM | POA: Diagnosis not present

## 2013-04-10 DIAGNOSIS — N039 Chronic nephritic syndrome with unspecified morphologic changes: Secondary | ICD-10-CM | POA: Diagnosis not present

## 2013-04-10 DIAGNOSIS — Z992 Dependence on renal dialysis: Secondary | ICD-10-CM | POA: Diagnosis not present

## 2013-04-10 DIAGNOSIS — N2581 Secondary hyperparathyroidism of renal origin: Secondary | ICD-10-CM | POA: Diagnosis not present

## 2013-04-10 DIAGNOSIS — N186 End stage renal disease: Secondary | ICD-10-CM | POA: Diagnosis not present

## 2013-04-10 DIAGNOSIS — D509 Iron deficiency anemia, unspecified: Secondary | ICD-10-CM | POA: Diagnosis not present

## 2013-04-11 ENCOUNTER — Encounter (HOSPITAL_COMMUNITY): Payer: Self-pay | Admitting: Pharmacy Technician

## 2013-04-12 ENCOUNTER — Other Ambulatory Visit: Payer: Self-pay

## 2013-04-17 MED ORDER — SODIUM CHLORIDE 0.9 % IJ SOLN: 3.0000 mL | INTRAMUSCULAR | Status: DC | PRN

## 2013-04-18 ENCOUNTER — Ambulatory Visit (HOSPITAL_COMMUNITY)
Admission: RE | Admit: 2013-04-18 | Discharge: 2013-04-18 | Disposition: A | Discharge status: Home / Self Care | Payer: Medicare Other | Source: Ambulatory Visit | Attending: Surgery | Admitting: Surgery

## 2013-04-18 ENCOUNTER — Telehealth: Payer: Self-pay | Admitting: Surgery

## 2013-04-18 ENCOUNTER — Encounter (HOSPITAL_COMMUNITY)
Admission: RE | Disposition: A | Discharge status: Home / Self Care | Payer: Self-pay | Source: Ambulatory Visit | Attending: Surgery

## 2013-04-18 ENCOUNTER — Telehealth: Payer: Self-pay

## 2013-04-18 DIAGNOSIS — N186 End stage renal disease: Secondary | ICD-10-CM | POA: Diagnosis not present

## 2013-04-18 DIAGNOSIS — F411 Generalized anxiety disorder: Secondary | ICD-10-CM | POA: Insufficient documentation

## 2013-04-18 DIAGNOSIS — Z881 Allergy status to other antibiotic agents status: Secondary | ICD-10-CM | POA: Diagnosis not present

## 2013-04-18 DIAGNOSIS — T82898A Other specified complication of vascular prosthetic devices, implants and grafts, initial encounter: Secondary | ICD-10-CM | POA: Diagnosis not present

## 2013-04-18 DIAGNOSIS — Z79899 Other long term (current) drug therapy: Secondary | ICD-10-CM | POA: Insufficient documentation

## 2013-04-18 DIAGNOSIS — D649 Anemia, unspecified: Secondary | ICD-10-CM | POA: Diagnosis not present

## 2013-04-18 DIAGNOSIS — K219 Gastro-esophageal reflux disease without esophagitis: Secondary | ICD-10-CM | POA: Insufficient documentation

## 2013-04-18 DIAGNOSIS — G4730 Sleep apnea, unspecified: Secondary | ICD-10-CM | POA: Insufficient documentation

## 2013-04-18 DIAGNOSIS — J45909 Unspecified asthma, uncomplicated: Secondary | ICD-10-CM | POA: Diagnosis not present

## 2013-04-18 DIAGNOSIS — Z885 Allergy status to narcotic agent status: Secondary | ICD-10-CM | POA: Diagnosis not present

## 2013-04-18 DIAGNOSIS — F988 Other specified behavioral and emotional disorders with onset usually occurring in childhood and adolescence: Secondary | ICD-10-CM | POA: Diagnosis not present

## 2013-04-18 DIAGNOSIS — M129 Arthropathy, unspecified: Secondary | ICD-10-CM | POA: Diagnosis not present

## 2013-04-18 DIAGNOSIS — IMO0002 Reserved for concepts with insufficient information to code with codable children: Secondary | ICD-10-CM | POA: Insufficient documentation

## 2013-04-18 DIAGNOSIS — I999 Unspecified disorder of circulatory system: Secondary | ICD-10-CM | POA: Diagnosis not present

## 2013-04-18 DIAGNOSIS — I871 Compression of vein: Secondary | ICD-10-CM | POA: Diagnosis not present

## 2013-04-18 DIAGNOSIS — F17200 Nicotine dependence, unspecified, uncomplicated: Secondary | ICD-10-CM | POA: Diagnosis not present

## 2013-04-18 DIAGNOSIS — I120 Hypertensive chronic kidney disease with stage 5 chronic kidney disease or end stage renal disease: Secondary | ICD-10-CM | POA: Insufficient documentation

## 2013-04-18 DIAGNOSIS — E669 Obesity, unspecified: Secondary | ICD-10-CM | POA: Insufficient documentation

## 2013-04-18 DIAGNOSIS — Z886 Allergy status to analgesic agent status: Secondary | ICD-10-CM | POA: Diagnosis not present

## 2013-04-18 DIAGNOSIS — Y832 Surgical operation with anastomosis, bypass or graft as the cause of abnormal reaction of the patient, or of later complication, without mention of misadventure at the time of the procedure: Secondary | ICD-10-CM | POA: Insufficient documentation

## 2013-04-18 DIAGNOSIS — Z889 Allergy status to unspecified drugs, medicaments and biological substances status: Secondary | ICD-10-CM | POA: Insufficient documentation

## 2013-04-18 DIAGNOSIS — Z6841 Body Mass Index (BMI) 40.0 and over, adult: Secondary | ICD-10-CM | POA: Diagnosis not present

## 2013-04-18 HISTORY — PX: SHUNTOGRAM: SHX5491

## 2013-04-18 LAB — POCT I-STAT, CHEM 8
BUN: 125 mg/dL — ABNORMAL HIGH (ref 6–23)
Creatinine, Ser: 7.1 mg/dL — ABNORMAL HIGH (ref 0.50–1.10)
Sodium: 141 mEq/L (ref 135–145)
TCO2: 20 mmol/L (ref 0–100)

## 2013-04-18 SURGERY — ASSESSMENT, SHUNT FUNCTION, WITH CONTRAST RADIOGRAPHIC STUDY
Anesthesia: LOCAL | Laterality: Left

## 2013-04-18 MED ORDER — GUAIFENESIN-DM 100-10 MG/5ML PO SYRP: 15.0000 mL | ORAL_SOLUTION | ORAL | Status: DC | PRN

## 2013-04-18 MED ORDER — HYDRALAZINE HCL 20 MG/ML IJ SOLN
INTRAMUSCULAR | Status: AC
  Filled 2013-04-18: qty 1

## 2013-04-18 MED ORDER — ACETAMINOPHEN 325 MG PO TABS: 325.0000 mg | ORAL_TABLET | ORAL | Status: DC | PRN

## 2013-04-18 MED ORDER — HEPARIN (PORCINE) IN NACL 2-0.9 UNIT/ML-% IJ SOLN
INTRAMUSCULAR | Status: AC
  Filled 2013-04-18: qty 500

## 2013-04-18 MED ORDER — MIDAZOLAM HCL 2 MG/2ML IJ SOLN
INTRAMUSCULAR | Status: AC
  Filled 2013-04-18: qty 2

## 2013-04-18 MED ORDER — LIDOCAINE HCL (PF) 1 % IJ SOLN
INTRAMUSCULAR | Status: AC
  Filled 2013-04-18: qty 30

## 2013-04-18 MED ORDER — METOPROLOL TARTRATE 1 MG/ML IV SOLN: 2.0000 mg | INTRAVENOUS | Status: DC | PRN

## 2013-04-18 MED ORDER — ONDANSETRON HCL 4 MG/2ML IJ SOLN: 4.0000 mg | Freq: Four times a day (QID) | INTRAMUSCULAR | Status: DC | PRN

## 2013-04-18 MED ORDER — ACETAMINOPHEN 325 MG RE SUPP: 325.0000 mg | RECTAL | Status: DC | PRN

## 2013-04-18 MED ORDER — LABETALOL HCL 5 MG/ML IV SOLN: 10.0000 mg | INTRAVENOUS | Status: DC | PRN

## 2013-04-18 MED ORDER — PHENOL 1.4 % MT LIQD: 1.0000 | OROMUCOSAL | Status: DC | PRN

## 2013-04-18 MED ORDER — HYDRALAZINE HCL 20 MG/ML IJ SOLN: 10.0000 mg | INTRAMUSCULAR | Status: DC | PRN

## 2013-04-18 NOTE — Telephone Encounter (Signed)
Pt. Called to report swelling and discoloration of left arm at site of fistula.  Voiced concern of hematoma; relates having had a hematoma w/a previous procedure.  Questioned about extent of bruising.  States the bruising goes all around the arm, in area of fistula.  Denies any appearance of fluid collection or ballooning-out in the area involved.  Does state the "arm is swollen."  Husband ques. if okay to apply an ice pack?   Discussed w/ Dr. Trula Slade;  informed of pt's c/o swelling and discoloration.  Advised to continue to monitor, apply ice pack to site, and elevate arm above level of heart; advise pt. to call office if symptoms worsen.  Advised pt. of Dr. Stephens Shire recommendations and verb. understanding.

## 2013-04-18 NOTE — Op Note (Signed)
[facility] [place]  Patient name: [name] MRN: [id number] DOB: 1971/10/05 Sex: female  04/18/2013 Pre-operative Diagnosis: End-stage renal disease Post-operative diagnosis:  Same Surgeon:  Eldridge Abrahams Procedure Performed:  1.  ultrasound-guided access left arm fistula  2.  fistulogram  3.  angioplasty left cephalic vein  4.  followup x1   Indications:  The patient has a new open area on her fistula. She has previously undergone intervention. Ultrasound identified an area of possible stenosis. She comes in today for further evaluation.  Procedure:  The patient was identified in the holding area and taken to room 8.  The patient was then placed supine on the table and prepped and draped in the usual sterile fashion.  A time out was called.  Ultrasound was used to evaluate the fistula.  The vein was patent and compressible.  A digital ultrasound image was acquired.  The fistula was then accessed under ultrasound guidance using a micropuncture needle.  An 018 wire was then asvanced without resistance and a micropuncture sheath was placed.  Contrast injections were then performed through the sheath.  Findings:  The central venous system is widely patent. The cephalic vein junction to the central system is widely patent. In the upper arm there is an area of luminal irregularity which likely correlates with the ultrasound findings. This represents approximately a 50% stenosis.   Intervention:  After the above images were acquired, the decision was made to proceed with intervention. Over a 035 wire, a 6 French sheath was placed. No heparin was administered. I selected an 8 x 40 Mustang balloon. This was taken to burst pressure and held for 30 seconds. Completion arteriogram revealed improved but suboptimal results. Therefore, I upsized to a 10 x 40 Mustang balloon. The balloon was taken to profile and held for 30 seconds. Completion angiogram revealed  improved results across the area of concern with residual stenosis down to less than 20%. Catheters and wires were removed. The sheath was removed with a suture placed for hemostasis.  Impression:  #1  successful venoplasty of a proximal cephalic vein stenosis using a 10 mm balloon  #2  widely patent central venous system     V. Annamarie Major, M.D. [facility] [place] Office: [phone] Pager:  [phone]

## 2013-04-18 NOTE — Interval H&P Note (Signed)
History and Physical Interval Note:  04/18/2013 8:03 AM  [name]  has presented today for surgery, with the diagnosis of instage renal  The various methods of treatment have been discussed with the patient and family. After consideration of risks, benefits and other options for treatment, the patient has consented to  Procedure(s): SHUNTOGRAM (N/A) as a surgical intervention .  The patient's history has been reviewed, patient examined, no change in status, stable for surgery.  I have reviewed the patient's chart and labs.  Questions were answered to the patient's satisfaction.     Nataleigh Griffin IV, V. WELLS

## 2013-04-18 NOTE — H&P (View-Only) (Signed)
[facility] [place]   Patient name: [name] MRN: [id number] DOB: 08/01/1971 Sex: female     Chief Complaint  Patient presents with  . Esrd    Left AVF upper arm  thinning  skin  bleeds off/on excessive 4-5 X's last 2-3 months.  HD:  T-Thur-S    HISTORY OF PRESENT ILLNESS: The patient comes in today for evaluation of the skin bending over her left upper arm AV fistula. This has been present for several months. She states that she has had bleeding issues for 5 times over the past one to 2 months. The patient has a history of left brachiocephalic fistula. She is also undergone superficialization and branch ligation. In December of 2013 she had a venoplasty of her cephalic vein and the axillary confluence.  The patient dialyzes on Tuesday Thursday Saturday.  Past Medical History  Diagnosis Date  . Anemia   . Depression   . Reflux   . Dizziness   . Anxiety   . ADD (attention deficit disorder)   . Asthma   . Headache(784.0)   . Gout   . Anemia   . GERD (gastroesophageal reflux disease)   . Obesity   . Gastritis   . Blood transfusion 06/2011    6 units transfused at [facility]  . Arthritis     knee, wrist, back   . Bronchitis   . Shortness of breath   . Sleep apnea     does not use CPAP  . Neuromuscular disorder     carpal tunnel  . Chronic kidney disease     Atrophic left kidney, sees Dr. Hassell Done  . Hypertension     sees Dr. Wendie Agreste    Past Surgical History  Procedure Laterality Date  . Cesarean section    . Spine surgery  2004    Lumbar diskectomy   . Microdiskectomy  04/2001    Herniated disk L5-S1  . Renal biopsy  04/2004  . Wisdom tooth extraction    . Carpel tunnel release      x 2; bilateral  . Upper gastrointestinal endoscopy    . Colonscopy    . Dilation and curettage of uterus    . Av fistula surgery  11/30/11    [facility] - redo fistula  . Hysteroscopy with thermachoice  12/10/2011    Procedure: HYSTEROSCOPY WITH  THERMACHOICE;  Surgeon: Betsy Coder, MD;  Location: [facility];  Service: Gynecology;  Laterality: N/A;  . Shuntogram  Dec. 18, 2013    Left arm  . Av fistula placement  06/10/11    Left brachiocephalic AVF    History   Social History  . Marital Status: Divorced    Spouse Name: N/A    Number of Children: N/A  . Years of Education: N/A   Occupational History  . Not on file.   Social History Main Topics  . Smoking status: Current Every Day Smoker -- 0.50 packs/day for 10 years    Types: Cigarettes  . Smokeless tobacco: Never Used  . Alcohol Use: Yes     Comment: ocassional  . Drug Use: No  . Sexually Active: Not Currently    Birth Control/ Protection: Condom   Other Topics Concern  . Not on file   Social History Narrative  . No narrative on file    Family History  Problem Relation Age of Onset  . Hypertension Mother   . Heart disease Father     Heart Disease  before age 33  . Hypertension Father   . Heart attack Father   . Hypertension Daughter     Allergies as of 04/09/2013 - Review Complete 04/09/2013  Allergen Reaction Noted  . Clarithromycin Nausea And Vomiting 06/23/2011  . Demerol Nausea And Vomiting 09/27/2011  . Dilaudid (hydromorphone hcl) Nausea And Vomiting 10/18/2012  . Fentanyl Nausea And Vomiting 12/20/2011  . Morphine and related Nausea And Vomiting 06/23/2011  . Nsaids  11/29/2011  . Other  10/18/2011    Current Outpatient Prescriptions on File Prior to Visit  Medication Sig Dispense Refill  . acetaminophen (TYLENOL) 500 MG tablet Take 1,000 mg by mouth daily as needed. For headache      . albuterol (PROVENTIL HFA;VENTOLIN HFA) 108 (90 BASE) MCG/ACT inhaler Inhale 2 puffs into the lungs every 6 (six) hours as needed. For shortness of breath       . allopurinol (ZYLOPRIM) 300 MG tablet Take 300 mg by mouth daily.       Marland Kitchen ALPRAZolam (XANAX) 0.5 MG tablet Take 0.5 mg by mouth 3 (three) times daily.       . budesonide-formoterol (SYMBICORT)  160-4.5 MCG/ACT inhaler Inhale 2 puffs into the lungs 2 (two) times daily.      . calcium acetate (PHOSLO) 667 MG capsule Take 667 mg by mouth 3 (three) times daily with meals.      . citalopram (CELEXA) 20 MG tablet Take 40 mg by mouth daily.       . cyclobenzaprine (FLEXERIL) 10 MG tablet Take 10 mg by mouth as needed. For muscle spasms      . esomeprazole (NEXIUM) 40 MG capsule Take 40 mg by mouth daily.       . furosemide (LASIX) 80 MG tablet Take 80-160 mg by mouth daily.       Marland Kitchen HYDROcodone-acetaminophen (VICODIN) 5-500 MG per tablet Take 1 tablet by mouth every 6 (six) hours as needed for pain.  40 tablet  0  . ibuprofen (ADVIL,MOTRIN) 200 MG tablet Take 400 mg by mouth every 6 (six) hours as needed. For pain      . lidocaine-prilocaine (EMLA) cream Apply 1 application topically every Monday, Wednesday, and Friday.      . montelukast (SINGULAIR) 10 MG tablet Take 10 mg by mouth every morning.       . multivitamin (RENA-VIT) TABS tablet Take 1 tablet by mouth daily.      . paricalcitol (ZEMPLAR) 2 MCG/ML injection Inject 2 mcg into the vein once a week. With every other hemodialysis treatment      . promethazine (PHENERGAN) 25 MG tablet Take 25 mg by mouth every 6 (six) hours as needed. For nausea      . sulfamethoxazole-trimethoprim (SEPTRA DS) 800-160 MG per tablet Take 1 tablet by mouth every 12 (twelve) hours.  14 tablet  0  . traMADol (ULTRAM) 50 MG tablet Take 1 tablet (50 mg total) by mouth every 6 (six) hours as needed.  30 tablet  0  . oxyCODONE (ROXICODONE) 15 MG immediate release tablet Take 15 mg by mouth 3 (three) times daily as needed. For pain      . oxyCODONE-acetaminophen (PERCOCET/ROXICET) 5-325 MG per tablet Take 1-2 tablets by mouth every 6 (six) hours as needed for pain.  12 tablet  0   No current facility-administered medications on file prior to visit.     REVIEW OF SYSTEMS: Cardiovascular: No chest pain, chest pressure, palpitations, orthopnea, or dyspnea on  exertion. No claudication or rest pain,  No history of DVT or phlebitis. Pulmonary: No productive cough, asthma or wheezing. Neurologic: No weakness, paresthesias, aphasia, or amaurosis. No dizziness. Hematologic: No bleeding problems or clotting disorders. Musculoskeletal: No joint pain or joint swelling. Gastrointestinal: No blood in stool or hematemesis Genitourinary: No dysuria or hematuria. Psychiatric:: No history of major depression. Integumentary: No rashes or ulcers. Constitutional: No fever or chills.  PHYSICAL EXAMINATION:   Vital signs are BP 146/93  Pulse 62  Resp 16  Ht 5\' 4"  (1.626 m)  Wt 255 lb (115.667 kg)  BMI 43.75 kg/m2  SpO2 98% General: The patient appears their stated age. HEENT:  No gross abnormalities Pulmonary:  Non labored breathing Musculoskeletal: There are no major deformities. Neurologic: No focal weakness or paresthesias are detected, Skin: There are no ulcer or rashes noted. Psychiatric: The patient has normal affect. Cardiovascular: There is a regular rate and rhythm without significant murmur appreciated. Aneurysmal fistula with punctate area in the upper arm which appears flat. There is no full-thickness skin breakdown however it is slightly thinned out over this area   Diagnostic Studies Fistula ultrasound today shows elevated velocities in the proximal cephalic vein in the shoulder area.  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 This is been scheduled for June 18. If no stenosis is identified. The patient did have surgical excision of this area. She also wants to have the very aneurysmal portion of the fistula resected near her antecubital crease. I will make further recommendations after a fistulogram.  V. Leia Alf, M.D. [facility] [place] Office: [phone] Pager:   [phone]

## 2013-04-18 NOTE — Telephone Encounter (Addendum)
Message copied by Doristine Section on Wed Apr 18, 2013 10:48 AM ------      Message from: Alfonso Patten      Created: Wed Apr 18, 2013 10:11 AM                   ----- Message -----         From: Serafina Mitchell, MD         Sent: 04/18/2013   9:20 AM           To: Patrici Ranks, Alfonso Patten, RN            04/18/2013:            Surgeon:  Eldridge Abrahams      Procedure Performed:       1.  ultrasound-guided access left arm fistula       2.  fistulogram       3.  angioplasty left cephalic vein       4.  followup x1                  Please have patient followup with me in the office in 2-3 weeks   ------

## 2013-04-20 DIAGNOSIS — N186 End stage renal disease: Secondary | ICD-10-CM | POA: Diagnosis not present

## 2013-04-20 DIAGNOSIS — D631 Anemia in chronic kidney disease: Secondary | ICD-10-CM | POA: Diagnosis not present

## 2013-04-20 DIAGNOSIS — D509 Iron deficiency anemia, unspecified: Secondary | ICD-10-CM | POA: Diagnosis not present

## 2013-04-20 DIAGNOSIS — Z992 Dependence on renal dialysis: Secondary | ICD-10-CM | POA: Diagnosis not present

## 2013-04-20 DIAGNOSIS — Z23 Encounter for immunization: Secondary | ICD-10-CM | POA: Diagnosis not present

## 2013-04-20 DIAGNOSIS — N2581 Secondary hyperparathyroidism of renal origin: Secondary | ICD-10-CM | POA: Diagnosis not present

## 2013-04-21 DIAGNOSIS — N2581 Secondary hyperparathyroidism of renal origin: Secondary | ICD-10-CM | POA: Diagnosis not present

## 2013-04-21 DIAGNOSIS — D631 Anemia in chronic kidney disease: Secondary | ICD-10-CM | POA: Diagnosis not present

## 2013-04-21 DIAGNOSIS — N186 End stage renal disease: Secondary | ICD-10-CM | POA: Diagnosis not present

## 2013-04-21 DIAGNOSIS — D509 Iron deficiency anemia, unspecified: Secondary | ICD-10-CM | POA: Diagnosis not present

## 2013-04-21 DIAGNOSIS — Z23 Encounter for immunization: Secondary | ICD-10-CM | POA: Diagnosis not present

## 2013-04-21 DIAGNOSIS — Z992 Dependence on renal dialysis: Secondary | ICD-10-CM | POA: Diagnosis not present

## 2013-04-24 DIAGNOSIS — N186 End stage renal disease: Secondary | ICD-10-CM | POA: Diagnosis not present

## 2013-04-24 DIAGNOSIS — N039 Chronic nephritic syndrome with unspecified morphologic changes: Secondary | ICD-10-CM | POA: Diagnosis not present

## 2013-04-24 DIAGNOSIS — D509 Iron deficiency anemia, unspecified: Secondary | ICD-10-CM | POA: Diagnosis not present

## 2013-04-24 DIAGNOSIS — N2581 Secondary hyperparathyroidism of renal origin: Secondary | ICD-10-CM | POA: Diagnosis not present

## 2013-04-24 DIAGNOSIS — Z992 Dependence on renal dialysis: Secondary | ICD-10-CM | POA: Diagnosis not present

## 2013-04-24 DIAGNOSIS — Z23 Encounter for immunization: Secondary | ICD-10-CM | POA: Diagnosis not present

## 2013-04-26 DIAGNOSIS — N2581 Secondary hyperparathyroidism of renal origin: Secondary | ICD-10-CM | POA: Diagnosis not present

## 2013-04-26 DIAGNOSIS — Z23 Encounter for immunization: Secondary | ICD-10-CM | POA: Diagnosis not present

## 2013-04-26 DIAGNOSIS — D631 Anemia in chronic kidney disease: Secondary | ICD-10-CM | POA: Diagnosis not present

## 2013-04-26 DIAGNOSIS — Z992 Dependence on renal dialysis: Secondary | ICD-10-CM | POA: Diagnosis not present

## 2013-04-26 DIAGNOSIS — D509 Iron deficiency anemia, unspecified: Secondary | ICD-10-CM | POA: Diagnosis not present

## 2013-04-26 DIAGNOSIS — N186 End stage renal disease: Secondary | ICD-10-CM | POA: Diagnosis not present

## 2013-04-30 ENCOUNTER — Ambulatory Visit: Payer: Medicare Other | Admitting: Surgery

## 2013-04-30 DIAGNOSIS — N186 End stage renal disease: Secondary | ICD-10-CM | POA: Diagnosis not present

## 2013-05-03 DIAGNOSIS — N186 End stage renal disease: Secondary | ICD-10-CM | POA: Diagnosis not present

## 2013-05-03 DIAGNOSIS — Z992 Dependence on renal dialysis: Secondary | ICD-10-CM | POA: Diagnosis not present

## 2013-05-03 DIAGNOSIS — D509 Iron deficiency anemia, unspecified: Secondary | ICD-10-CM | POA: Diagnosis not present

## 2013-05-03 DIAGNOSIS — N2581 Secondary hyperparathyroidism of renal origin: Secondary | ICD-10-CM | POA: Diagnosis not present

## 2013-05-03 DIAGNOSIS — D631 Anemia in chronic kidney disease: Secondary | ICD-10-CM | POA: Diagnosis not present

## 2013-05-07 DIAGNOSIS — Z992 Dependence on renal dialysis: Secondary | ICD-10-CM | POA: Diagnosis not present

## 2013-05-07 DIAGNOSIS — N2581 Secondary hyperparathyroidism of renal origin: Secondary | ICD-10-CM | POA: Diagnosis not present

## 2013-05-07 DIAGNOSIS — N186 End stage renal disease: Secondary | ICD-10-CM | POA: Diagnosis not present

## 2013-05-07 DIAGNOSIS — D631 Anemia in chronic kidney disease: Secondary | ICD-10-CM | POA: Diagnosis not present

## 2013-05-07 DIAGNOSIS — D509 Iron deficiency anemia, unspecified: Secondary | ICD-10-CM | POA: Diagnosis not present

## 2013-05-08 DIAGNOSIS — N039 Chronic nephritic syndrome with unspecified morphologic changes: Secondary | ICD-10-CM | POA: Diagnosis not present

## 2013-05-08 DIAGNOSIS — N186 End stage renal disease: Secondary | ICD-10-CM | POA: Diagnosis not present

## 2013-05-08 DIAGNOSIS — N2581 Secondary hyperparathyroidism of renal origin: Secondary | ICD-10-CM | POA: Diagnosis not present

## 2013-05-08 DIAGNOSIS — Z992 Dependence on renal dialysis: Secondary | ICD-10-CM | POA: Diagnosis not present

## 2013-05-08 DIAGNOSIS — D509 Iron deficiency anemia, unspecified: Secondary | ICD-10-CM | POA: Diagnosis not present

## 2013-05-09 DIAGNOSIS — J45909 Unspecified asthma, uncomplicated: Secondary | ICD-10-CM | POA: Diagnosis not present

## 2013-05-09 DIAGNOSIS — N189 Chronic kidney disease, unspecified: Secondary | ICD-10-CM | POA: Diagnosis not present

## 2013-05-09 DIAGNOSIS — E785 Hyperlipidemia, unspecified: Secondary | ICD-10-CM | POA: Diagnosis not present

## 2013-05-10 DIAGNOSIS — N186 End stage renal disease: Secondary | ICD-10-CM | POA: Diagnosis not present

## 2013-05-10 DIAGNOSIS — Z992 Dependence on renal dialysis: Secondary | ICD-10-CM | POA: Diagnosis not present

## 2013-05-10 DIAGNOSIS — N039 Chronic nephritic syndrome with unspecified morphologic changes: Secondary | ICD-10-CM | POA: Diagnosis not present

## 2013-05-10 DIAGNOSIS — N2581 Secondary hyperparathyroidism of renal origin: Secondary | ICD-10-CM | POA: Diagnosis not present

## 2013-05-10 DIAGNOSIS — D631 Anemia in chronic kidney disease: Secondary | ICD-10-CM | POA: Diagnosis not present

## 2013-05-10 DIAGNOSIS — D509 Iron deficiency anemia, unspecified: Secondary | ICD-10-CM | POA: Diagnosis not present

## 2013-05-11 ENCOUNTER — Encounter: Payer: Self-pay | Admitting: Surgery

## 2013-05-11 DIAGNOSIS — Z79899 Other long term (current) drug therapy: Secondary | ICD-10-CM | POA: Diagnosis not present

## 2013-05-11 DIAGNOSIS — G894 Chronic pain syndrome: Secondary | ICD-10-CM | POA: Diagnosis not present

## 2013-05-11 DIAGNOSIS — M5137 Other intervertebral disc degeneration, lumbosacral region: Secondary | ICD-10-CM | POA: Diagnosis not present

## 2013-05-11 DIAGNOSIS — F17200 Nicotine dependence, unspecified, uncomplicated: Secondary | ICD-10-CM | POA: Diagnosis not present

## 2013-05-11 DIAGNOSIS — M47817 Spondylosis without myelopathy or radiculopathy, lumbosacral region: Secondary | ICD-10-CM | POA: Diagnosis not present

## 2013-05-11 DIAGNOSIS — Z5181 Encounter for therapeutic drug level monitoring: Secondary | ICD-10-CM | POA: Diagnosis not present

## 2013-05-14 ENCOUNTER — Ambulatory Visit: Payer: Medicare Other | Admitting: Surgery

## 2013-05-22 DIAGNOSIS — Z992 Dependence on renal dialysis: Secondary | ICD-10-CM | POA: Diagnosis not present

## 2013-05-22 DIAGNOSIS — D509 Iron deficiency anemia, unspecified: Secondary | ICD-10-CM | POA: Diagnosis not present

## 2013-05-22 DIAGNOSIS — N2581 Secondary hyperparathyroidism of renal origin: Secondary | ICD-10-CM | POA: Diagnosis not present

## 2013-05-22 DIAGNOSIS — D631 Anemia in chronic kidney disease: Secondary | ICD-10-CM | POA: Diagnosis not present

## 2013-05-22 DIAGNOSIS — N186 End stage renal disease: Secondary | ICD-10-CM | POA: Diagnosis not present

## 2013-05-25 DIAGNOSIS — D631 Anemia in chronic kidney disease: Secondary | ICD-10-CM | POA: Diagnosis not present

## 2013-05-25 DIAGNOSIS — D509 Iron deficiency anemia, unspecified: Secondary | ICD-10-CM | POA: Diagnosis not present

## 2013-05-25 DIAGNOSIS — Z992 Dependence on renal dialysis: Secondary | ICD-10-CM | POA: Diagnosis not present

## 2013-05-25 DIAGNOSIS — N186 End stage renal disease: Secondary | ICD-10-CM | POA: Diagnosis not present

## 2013-05-25 DIAGNOSIS — N2581 Secondary hyperparathyroidism of renal origin: Secondary | ICD-10-CM | POA: Diagnosis not present

## 2013-05-31 DIAGNOSIS — N186 End stage renal disease: Secondary | ICD-10-CM | POA: Diagnosis not present

## 2013-06-05 DIAGNOSIS — E876 Hypokalemia: Secondary | ICD-10-CM | POA: Diagnosis not present

## 2013-06-05 DIAGNOSIS — D631 Anemia in chronic kidney disease: Secondary | ICD-10-CM | POA: Diagnosis not present

## 2013-06-05 DIAGNOSIS — Z992 Dependence on renal dialysis: Secondary | ICD-10-CM | POA: Diagnosis not present

## 2013-06-05 DIAGNOSIS — N186 End stage renal disease: Secondary | ICD-10-CM | POA: Diagnosis not present

## 2013-06-07 DIAGNOSIS — N186 End stage renal disease: Secondary | ICD-10-CM | POA: Diagnosis not present

## 2013-06-07 DIAGNOSIS — Z992 Dependence on renal dialysis: Secondary | ICD-10-CM | POA: Diagnosis not present

## 2013-06-07 DIAGNOSIS — N039 Chronic nephritic syndrome with unspecified morphologic changes: Secondary | ICD-10-CM | POA: Diagnosis not present

## 2013-06-07 DIAGNOSIS — E876 Hypokalemia: Secondary | ICD-10-CM | POA: Diagnosis not present

## 2013-06-08 DIAGNOSIS — M5137 Other intervertebral disc degeneration, lumbosacral region: Secondary | ICD-10-CM | POA: Diagnosis not present

## 2013-06-08 DIAGNOSIS — IMO0002 Reserved for concepts with insufficient information to code with codable children: Secondary | ICD-10-CM | POA: Diagnosis not present

## 2013-06-08 DIAGNOSIS — M47817 Spondylosis without myelopathy or radiculopathy, lumbosacral region: Secondary | ICD-10-CM | POA: Diagnosis not present

## 2013-06-08 DIAGNOSIS — G894 Chronic pain syndrome: Secondary | ICD-10-CM | POA: Diagnosis not present

## 2013-06-21 DIAGNOSIS — D631 Anemia in chronic kidney disease: Secondary | ICD-10-CM | POA: Diagnosis not present

## 2013-06-21 DIAGNOSIS — Z992 Dependence on renal dialysis: Secondary | ICD-10-CM | POA: Diagnosis not present

## 2013-06-21 DIAGNOSIS — N186 End stage renal disease: Secondary | ICD-10-CM | POA: Diagnosis not present

## 2013-06-21 DIAGNOSIS — E876 Hypokalemia: Secondary | ICD-10-CM | POA: Diagnosis not present

## 2013-06-27 DIAGNOSIS — D631 Anemia in chronic kidney disease: Secondary | ICD-10-CM | POA: Diagnosis not present

## 2013-06-27 DIAGNOSIS — Z992 Dependence on renal dialysis: Secondary | ICD-10-CM | POA: Diagnosis not present

## 2013-06-27 DIAGNOSIS — N186 End stage renal disease: Secondary | ICD-10-CM | POA: Diagnosis not present

## 2013-06-27 DIAGNOSIS — E876 Hypokalemia: Secondary | ICD-10-CM | POA: Diagnosis not present

## 2013-06-30 DIAGNOSIS — Z992 Dependence on renal dialysis: Secondary | ICD-10-CM | POA: Diagnosis not present

## 2013-06-30 DIAGNOSIS — E876 Hypokalemia: Secondary | ICD-10-CM | POA: Diagnosis not present

## 2013-06-30 DIAGNOSIS — N186 End stage renal disease: Secondary | ICD-10-CM | POA: Diagnosis not present

## 2013-06-30 DIAGNOSIS — D631 Anemia in chronic kidney disease: Secondary | ICD-10-CM | POA: Diagnosis not present

## 2013-07-01 DIAGNOSIS — N186 End stage renal disease: Secondary | ICD-10-CM | POA: Diagnosis not present

## 2013-07-05 DIAGNOSIS — N186 End stage renal disease: Secondary | ICD-10-CM | POA: Diagnosis not present

## 2013-07-05 DIAGNOSIS — Z992 Dependence on renal dialysis: Secondary | ICD-10-CM | POA: Diagnosis not present

## 2013-07-05 DIAGNOSIS — E876 Hypokalemia: Secondary | ICD-10-CM | POA: Diagnosis not present

## 2013-07-05 DIAGNOSIS — D631 Anemia in chronic kidney disease: Secondary | ICD-10-CM | POA: Diagnosis not present

## 2013-07-12 DIAGNOSIS — E876 Hypokalemia: Secondary | ICD-10-CM | POA: Diagnosis not present

## 2013-07-12 DIAGNOSIS — N186 End stage renal disease: Secondary | ICD-10-CM | POA: Diagnosis not present

## 2013-07-12 DIAGNOSIS — D631 Anemia in chronic kidney disease: Secondary | ICD-10-CM | POA: Diagnosis not present

## 2013-07-12 DIAGNOSIS — Z992 Dependence on renal dialysis: Secondary | ICD-10-CM | POA: Diagnosis not present

## 2013-07-17 DIAGNOSIS — D631 Anemia in chronic kidney disease: Secondary | ICD-10-CM | POA: Diagnosis not present

## 2013-07-17 DIAGNOSIS — E876 Hypokalemia: Secondary | ICD-10-CM | POA: Diagnosis not present

## 2013-07-17 DIAGNOSIS — Z992 Dependence on renal dialysis: Secondary | ICD-10-CM | POA: Diagnosis not present

## 2013-07-17 DIAGNOSIS — N186 End stage renal disease: Secondary | ICD-10-CM | POA: Diagnosis not present

## 2013-07-18 DIAGNOSIS — R490 Dysphonia: Secondary | ICD-10-CM | POA: Diagnosis not present

## 2013-07-18 DIAGNOSIS — R059 Cough, unspecified: Secondary | ICD-10-CM | POA: Diagnosis not present

## 2013-07-18 DIAGNOSIS — R05 Cough: Secondary | ICD-10-CM | POA: Diagnosis not present

## 2013-07-18 DIAGNOSIS — F17200 Nicotine dependence, unspecified, uncomplicated: Secondary | ICD-10-CM | POA: Diagnosis not present

## 2013-07-18 DIAGNOSIS — R1310 Dysphagia, unspecified: Secondary | ICD-10-CM | POA: Diagnosis not present

## 2013-07-18 DIAGNOSIS — J384 Edema of larynx: Secondary | ICD-10-CM | POA: Diagnosis not present

## 2013-07-19 DIAGNOSIS — N186 End stage renal disease: Secondary | ICD-10-CM | POA: Diagnosis not present

## 2013-07-19 DIAGNOSIS — Z992 Dependence on renal dialysis: Secondary | ICD-10-CM | POA: Diagnosis not present

## 2013-07-19 DIAGNOSIS — E876 Hypokalemia: Secondary | ICD-10-CM | POA: Diagnosis not present

## 2013-07-19 DIAGNOSIS — D631 Anemia in chronic kidney disease: Secondary | ICD-10-CM | POA: Diagnosis not present

## 2013-07-25 DIAGNOSIS — E876 Hypokalemia: Secondary | ICD-10-CM | POA: Diagnosis not present

## 2013-07-25 DIAGNOSIS — Z992 Dependence on renal dialysis: Secondary | ICD-10-CM | POA: Diagnosis not present

## 2013-07-25 DIAGNOSIS — D631 Anemia in chronic kidney disease: Secondary | ICD-10-CM | POA: Diagnosis not present

## 2013-07-25 DIAGNOSIS — N186 End stage renal disease: Secondary | ICD-10-CM | POA: Diagnosis not present

## 2013-07-26 DIAGNOSIS — N186 End stage renal disease: Secondary | ICD-10-CM | POA: Diagnosis not present

## 2013-07-26 DIAGNOSIS — D631 Anemia in chronic kidney disease: Secondary | ICD-10-CM | POA: Diagnosis not present

## 2013-07-26 DIAGNOSIS — Z992 Dependence on renal dialysis: Secondary | ICD-10-CM | POA: Diagnosis not present

## 2013-07-26 DIAGNOSIS — E876 Hypokalemia: Secondary | ICD-10-CM | POA: Diagnosis not present

## 2013-07-28 DIAGNOSIS — N186 End stage renal disease: Secondary | ICD-10-CM | POA: Diagnosis not present

## 2013-07-28 DIAGNOSIS — D631 Anemia in chronic kidney disease: Secondary | ICD-10-CM | POA: Diagnosis not present

## 2013-07-28 DIAGNOSIS — Z992 Dependence on renal dialysis: Secondary | ICD-10-CM | POA: Diagnosis not present

## 2013-07-28 DIAGNOSIS — E876 Hypokalemia: Secondary | ICD-10-CM | POA: Diagnosis not present

## 2013-07-30 DIAGNOSIS — M5137 Other intervertebral disc degeneration, lumbosacral region: Secondary | ICD-10-CM | POA: Diagnosis not present

## 2013-07-30 DIAGNOSIS — IMO0002 Reserved for concepts with insufficient information to code with codable children: Secondary | ICD-10-CM | POA: Diagnosis not present

## 2013-07-30 DIAGNOSIS — Z79899 Other long term (current) drug therapy: Secondary | ICD-10-CM | POA: Diagnosis not present

## 2013-07-30 DIAGNOSIS — G894 Chronic pain syndrome: Secondary | ICD-10-CM | POA: Diagnosis not present

## 2013-07-31 DIAGNOSIS — Z992 Dependence on renal dialysis: Secondary | ICD-10-CM | POA: Diagnosis not present

## 2013-07-31 DIAGNOSIS — D631 Anemia in chronic kidney disease: Secondary | ICD-10-CM | POA: Diagnosis not present

## 2013-07-31 DIAGNOSIS — N186 End stage renal disease: Secondary | ICD-10-CM | POA: Diagnosis not present

## 2013-07-31 DIAGNOSIS — E876 Hypokalemia: Secondary | ICD-10-CM | POA: Diagnosis not present

## 2013-08-08 DIAGNOSIS — Z23 Encounter for immunization: Secondary | ICD-10-CM | POA: Diagnosis not present

## 2013-08-08 DIAGNOSIS — Z992 Dependence on renal dialysis: Secondary | ICD-10-CM | POA: Diagnosis not present

## 2013-08-08 DIAGNOSIS — E876 Hypokalemia: Secondary | ICD-10-CM | POA: Diagnosis not present

## 2013-08-08 DIAGNOSIS — N186 End stage renal disease: Secondary | ICD-10-CM | POA: Diagnosis not present

## 2013-08-08 DIAGNOSIS — D631 Anemia in chronic kidney disease: Secondary | ICD-10-CM | POA: Diagnosis not present

## 2013-08-09 DIAGNOSIS — Z992 Dependence on renal dialysis: Secondary | ICD-10-CM | POA: Diagnosis not present

## 2013-08-09 DIAGNOSIS — Z23 Encounter for immunization: Secondary | ICD-10-CM | POA: Diagnosis not present

## 2013-08-09 DIAGNOSIS — D631 Anemia in chronic kidney disease: Secondary | ICD-10-CM | POA: Diagnosis not present

## 2013-08-09 DIAGNOSIS — E876 Hypokalemia: Secondary | ICD-10-CM | POA: Diagnosis not present

## 2013-08-09 DIAGNOSIS — N186 End stage renal disease: Secondary | ICD-10-CM | POA: Diagnosis not present

## 2013-08-10 DIAGNOSIS — M5380 Other specified dorsopathies, site unspecified: Secondary | ICD-10-CM | POA: Diagnosis not present

## 2013-08-10 DIAGNOSIS — IMO0002 Reserved for concepts with insufficient information to code with codable children: Secondary | ICD-10-CM | POA: Diagnosis not present

## 2013-08-10 DIAGNOSIS — G894 Chronic pain syndrome: Secondary | ICD-10-CM | POA: Diagnosis not present

## 2013-08-10 DIAGNOSIS — M5137 Other intervertebral disc degeneration, lumbosacral region: Secondary | ICD-10-CM | POA: Diagnosis not present

## 2013-08-17 DIAGNOSIS — D631 Anemia in chronic kidney disease: Secondary | ICD-10-CM | POA: Diagnosis not present

## 2013-08-17 DIAGNOSIS — N186 End stage renal disease: Secondary | ICD-10-CM | POA: Diagnosis not present

## 2013-08-17 DIAGNOSIS — Z23 Encounter for immunization: Secondary | ICD-10-CM | POA: Diagnosis not present

## 2013-08-17 DIAGNOSIS — E876 Hypokalemia: Secondary | ICD-10-CM | POA: Diagnosis not present

## 2013-08-17 DIAGNOSIS — Z992 Dependence on renal dialysis: Secondary | ICD-10-CM | POA: Diagnosis not present

## 2013-08-22 DIAGNOSIS — E876 Hypokalemia: Secondary | ICD-10-CM | POA: Diagnosis not present

## 2013-08-22 DIAGNOSIS — N186 End stage renal disease: Secondary | ICD-10-CM | POA: Diagnosis not present

## 2013-08-22 DIAGNOSIS — D631 Anemia in chronic kidney disease: Secondary | ICD-10-CM | POA: Diagnosis not present

## 2013-08-22 DIAGNOSIS — Z992 Dependence on renal dialysis: Secondary | ICD-10-CM | POA: Diagnosis not present

## 2013-08-22 DIAGNOSIS — Z23 Encounter for immunization: Secondary | ICD-10-CM | POA: Diagnosis not present

## 2013-08-23 DIAGNOSIS — N186 End stage renal disease: Secondary | ICD-10-CM | POA: Diagnosis not present

## 2013-08-23 DIAGNOSIS — E876 Hypokalemia: Secondary | ICD-10-CM | POA: Diagnosis not present

## 2013-08-23 DIAGNOSIS — Z992 Dependence on renal dialysis: Secondary | ICD-10-CM | POA: Diagnosis not present

## 2013-08-23 DIAGNOSIS — Z23 Encounter for immunization: Secondary | ICD-10-CM | POA: Diagnosis not present

## 2013-08-23 DIAGNOSIS — D631 Anemia in chronic kidney disease: Secondary | ICD-10-CM | POA: Diagnosis not present

## 2013-08-31 DIAGNOSIS — N186 End stage renal disease: Secondary | ICD-10-CM | POA: Diagnosis not present

## 2013-09-01 DIAGNOSIS — N186 End stage renal disease: Secondary | ICD-10-CM | POA: Diagnosis not present

## 2013-09-01 DIAGNOSIS — E875 Hyperkalemia: Secondary | ICD-10-CM | POA: Diagnosis not present

## 2013-09-01 DIAGNOSIS — Z992 Dependence on renal dialysis: Secondary | ICD-10-CM | POA: Diagnosis not present

## 2013-09-01 DIAGNOSIS — D631 Anemia in chronic kidney disease: Secondary | ICD-10-CM | POA: Diagnosis not present

## 2013-09-04 DIAGNOSIS — D631 Anemia in chronic kidney disease: Secondary | ICD-10-CM | POA: Diagnosis not present

## 2013-09-04 DIAGNOSIS — E875 Hyperkalemia: Secondary | ICD-10-CM | POA: Diagnosis not present

## 2013-09-04 DIAGNOSIS — N186 End stage renal disease: Secondary | ICD-10-CM | POA: Diagnosis not present

## 2013-09-04 DIAGNOSIS — Z992 Dependence on renal dialysis: Secondary | ICD-10-CM | POA: Diagnosis not present

## 2013-09-12 DIAGNOSIS — N186 End stage renal disease: Secondary | ICD-10-CM | POA: Diagnosis not present

## 2013-09-12 DIAGNOSIS — D631 Anemia in chronic kidney disease: Secondary | ICD-10-CM | POA: Diagnosis not present

## 2013-09-12 DIAGNOSIS — Z992 Dependence on renal dialysis: Secondary | ICD-10-CM | POA: Diagnosis not present

## 2013-09-12 DIAGNOSIS — E875 Hyperkalemia: Secondary | ICD-10-CM | POA: Diagnosis not present

## 2013-09-13 DIAGNOSIS — E875 Hyperkalemia: Secondary | ICD-10-CM | POA: Diagnosis not present

## 2013-09-13 DIAGNOSIS — Z992 Dependence on renal dialysis: Secondary | ICD-10-CM | POA: Diagnosis not present

## 2013-09-13 DIAGNOSIS — N186 End stage renal disease: Secondary | ICD-10-CM | POA: Diagnosis not present

## 2013-09-13 DIAGNOSIS — D631 Anemia in chronic kidney disease: Secondary | ICD-10-CM | POA: Diagnosis not present

## 2013-09-14 DIAGNOSIS — M5137 Other intervertebral disc degeneration, lumbosacral region: Secondary | ICD-10-CM | POA: Diagnosis not present

## 2013-09-14 DIAGNOSIS — IMO0002 Reserved for concepts with insufficient information to code with codable children: Secondary | ICD-10-CM | POA: Diagnosis not present

## 2013-09-14 DIAGNOSIS — Z5181 Encounter for therapeutic drug level monitoring: Secondary | ICD-10-CM | POA: Diagnosis not present

## 2013-09-14 DIAGNOSIS — G894 Chronic pain syndrome: Secondary | ICD-10-CM | POA: Diagnosis not present

## 2013-09-14 DIAGNOSIS — M5380 Other specified dorsopathies, site unspecified: Secondary | ICD-10-CM | POA: Diagnosis not present

## 2013-09-14 DIAGNOSIS — Z79899 Other long term (current) drug therapy: Secondary | ICD-10-CM | POA: Diagnosis not present

## 2013-09-20 DIAGNOSIS — E875 Hyperkalemia: Secondary | ICD-10-CM | POA: Diagnosis not present

## 2013-09-20 DIAGNOSIS — Z992 Dependence on renal dialysis: Secondary | ICD-10-CM | POA: Diagnosis not present

## 2013-09-20 DIAGNOSIS — D631 Anemia in chronic kidney disease: Secondary | ICD-10-CM | POA: Diagnosis not present

## 2013-09-20 DIAGNOSIS — N186 End stage renal disease: Secondary | ICD-10-CM | POA: Diagnosis not present

## 2013-09-25 ENCOUNTER — Emergency Department (HOSPITAL_COMMUNITY)
Admission: EM | Admit: 2013-09-25 | Discharge: 2013-09-25 | Disposition: A | Discharge status: Home / Self Care | Payer: Medicare Other | Attending: Emergency Medicine | Admitting: Emergency Medicine

## 2013-09-25 ENCOUNTER — Emergency Department (INDEPENDENT_AMBULATORY_CARE_PROVIDER_SITE_OTHER)
Admission: EM | Admit: 2013-09-25 | Discharge: 2013-09-25 | Disposition: A | Discharge status: Hospice | Payer: Medicare Other | Source: Home / Self Care | Attending: Family Medicine | Admitting: Family Medicine

## 2013-09-25 ENCOUNTER — Encounter (HOSPITAL_COMMUNITY): Payer: Self-pay | Admitting: Emergency Medicine

## 2013-09-25 ENCOUNTER — Emergency Department (HOSPITAL_COMMUNITY): Payer: Medicare Other

## 2013-09-25 DIAGNOSIS — Z5189 Encounter for other specified aftercare: Secondary | ICD-10-CM | POA: Insufficient documentation

## 2013-09-25 DIAGNOSIS — R220 Localized swelling, mass and lump, head: Secondary | ICD-10-CM | POA: Diagnosis not present

## 2013-09-25 DIAGNOSIS — K219 Gastro-esophageal reflux disease without esophagitis: Secondary | ICD-10-CM | POA: Insufficient documentation

## 2013-09-25 DIAGNOSIS — Z992 Dependence on renal dialysis: Secondary | ICD-10-CM | POA: Diagnosis not present

## 2013-09-25 DIAGNOSIS — F17200 Nicotine dependence, unspecified, uncomplicated: Secondary | ICD-10-CM | POA: Insufficient documentation

## 2013-09-25 DIAGNOSIS — G4730 Sleep apnea, unspecified: Secondary | ICD-10-CM | POA: Insufficient documentation

## 2013-09-25 DIAGNOSIS — F988 Other specified behavioral and emotional disorders with onset usually occurring in childhood and adolescence: Secondary | ICD-10-CM | POA: Insufficient documentation

## 2013-09-25 DIAGNOSIS — F329 Major depressive disorder, single episode, unspecified: Secondary | ICD-10-CM | POA: Insufficient documentation

## 2013-09-25 DIAGNOSIS — K029 Dental caries, unspecified: Secondary | ICD-10-CM | POA: Diagnosis not present

## 2013-09-25 DIAGNOSIS — G709 Myoneural disorder, unspecified: Secondary | ICD-10-CM | POA: Diagnosis not present

## 2013-09-25 DIAGNOSIS — Z8669 Personal history of other diseases of the nervous system and sense organs: Secondary | ICD-10-CM | POA: Diagnosis not present

## 2013-09-25 DIAGNOSIS — Z8709 Personal history of other diseases of the respiratory system: Secondary | ICD-10-CM | POA: Insufficient documentation

## 2013-09-25 DIAGNOSIS — R51 Headache: Secondary | ICD-10-CM

## 2013-09-25 DIAGNOSIS — N186 End stage renal disease: Secondary | ICD-10-CM | POA: Insufficient documentation

## 2013-09-25 DIAGNOSIS — E669 Obesity, unspecified: Secondary | ICD-10-CM | POA: Diagnosis not present

## 2013-09-25 DIAGNOSIS — D649 Anemia, unspecified: Secondary | ICD-10-CM | POA: Diagnosis not present

## 2013-09-25 DIAGNOSIS — F411 Generalized anxiety disorder: Secondary | ICD-10-CM | POA: Diagnosis not present

## 2013-09-25 DIAGNOSIS — F3289 Other specified depressive episodes: Secondary | ICD-10-CM | POA: Insufficient documentation

## 2013-09-25 DIAGNOSIS — K1120 Sialoadenitis, unspecified: Secondary | ICD-10-CM | POA: Diagnosis not present

## 2013-09-25 DIAGNOSIS — M129 Arthropathy, unspecified: Secondary | ICD-10-CM | POA: Diagnosis not present

## 2013-09-25 DIAGNOSIS — I120 Hypertensive chronic kidney disease with stage 5 chronic kidney disease or end stage renal disease: Secondary | ICD-10-CM | POA: Diagnosis not present

## 2013-09-25 DIAGNOSIS — Z881 Allergy status to other antibiotic agents status: Secondary | ICD-10-CM | POA: Diagnosis not present

## 2013-09-25 DIAGNOSIS — Z8719 Personal history of other diseases of the digestive system: Secondary | ICD-10-CM | POA: Diagnosis not present

## 2013-09-25 DIAGNOSIS — M109 Gout, unspecified: Secondary | ICD-10-CM | POA: Diagnosis not present

## 2013-09-25 DIAGNOSIS — Z885 Allergy status to narcotic agent status: Secondary | ICD-10-CM | POA: Insufficient documentation

## 2013-09-25 DIAGNOSIS — Z79899 Other long term (current) drug therapy: Secondary | ICD-10-CM | POA: Insufficient documentation

## 2013-09-25 DIAGNOSIS — J45909 Unspecified asthma, uncomplicated: Secondary | ICD-10-CM | POA: Diagnosis not present

## 2013-09-25 HISTORY — DX: Disorder of kidney and ureter, unspecified: N28.9

## 2013-09-25 LAB — POCT I-STAT, CHEM 8
BUN: 61 mg/dL — ABNORMAL HIGH (ref 6–23)
Calcium, Ion: 1.23 mmol/L (ref 1.12–1.23)
Chloride: 106 mEq/L (ref 96–112)
Glucose, Bld: 118 mg/dL — ABNORMAL HIGH (ref 70–99)
HCT: 35 % — ABNORMAL LOW (ref 36.0–46.0)

## 2013-09-25 LAB — CBC
HCT: 30.7 % — ABNORMAL LOW (ref 36.0–46.0)
MCV: 100.7 fL — ABNORMAL HIGH (ref 78.0–100.0)
RDW: 16.8 % — ABNORMAL HIGH (ref 11.5–15.5)
WBC: 12.5 10*3/uL — ABNORMAL HIGH (ref 4.0–10.5)

## 2013-09-25 MED ORDER — IOHEXOL 300 MG/ML  SOLN
80.0000 mL | Freq: Once | INTRAMUSCULAR | Status: AC | PRN
  Administered 2013-09-25: 80 mL via INTRAVENOUS

## 2013-09-25 MED ORDER — AMOXICILLIN-POT CLAVULANATE 500-125 MG PO TABS: 1.0000 | ORAL_TABLET | Freq: Three times a day (TID) | ORAL | Status: DC

## 2013-09-25 MED ORDER — OXYCODONE-ACETAMINOPHEN 5-325 MG PO TABS: 2.0000 | ORAL_TABLET | ORAL | Status: DC | PRN

## 2013-09-25 MED ORDER — HYDROCODONE-ACETAMINOPHEN 5-325 MG PO TABS
2.0000 | ORAL_TABLET | Freq: Once | ORAL | Status: AC
  Administered 2013-09-25: 2 via ORAL
  Filled 2013-09-25: qty 2

## 2013-09-25 NOTE — ED Notes (Signed)
Pt was sent here from [facility] for further evaluation of headache.  Pt was sent down for labs and CT.  Pt is tearful with puffy eyes from crying.  Pt states that she has left facial swelling.  Pt states that the left side of her face hurts.  Pt denies any numbness or weakness.  Pt is a HD pt with last HD treatment on Thursday (she missed Saturday bc she wasn't feeling well).  Pt has pain in left eye.

## 2013-09-25 NOTE — ED Provider Notes (Signed)
CSN: [id number]     Arrival date & time 09/25/13  1525 History   First MD Initiated Contact with Patient 09/25/13 1634     Chief Complaint  Patient presents with  . Headache   (Consider location/radiation/quality/duration/timing/severity/associated sxs/prior Treatment) HPI Comments: Patient is a 42 year old female with past medical history of disease on hemodialysis she presents today with complaints of severe pain in the left side of her face that started earlier in the day. She denies any injury or trauma. She was seen at urgent care and sent here for further evaluation of possible orbital cellulitis.  Patient is a 42 y.o. female presenting with headaches. The history is provided by the patient.  Headache Location: Left face. Quality:  Stabbing Radiates to:  Does not radiate Onset quality:  Gradual Duration:  2 days Timing:  Constant Progression:  Worsening Chronicity:  New Similar to prior headaches: no   Context: activity   Relieved by:  Nothing Worsened by:  Nothing tried Ineffective treatments:  None tried   Past Medical History  Diagnosis Date  . Anemia   . Depression   . Reflux   . Dizziness   . Anxiety   . ADD (attention deficit disorder)   . Asthma   . Headache(784.0)   . Gout   . Anemia   . GERD (gastroesophageal reflux disease)   . Obesity   . Gastritis   . Blood transfusion 06/2011    6 units transfused at [facility]  . Arthritis     knee, wrist, back   . Bronchitis   . Shortness of breath   . Sleep apnea     does not use CPAP  . Neuromuscular disorder     carpal tunnel  . Chronic kidney disease     Atrophic left kidney, sees Dr. Hassell Done  . Hypertension     sees Dr. Wendie Agreste  . Renal insufficiency    Past Surgical History  Procedure Laterality Date  . Cesarean section    . Spine surgery  2004    Lumbar diskectomy   . Microdiskectomy  04/2001    Herniated disk L5-S1  . Renal biopsy  04/2004  . Wisdom tooth extraction    . Carpel tunnel release       x 2; bilateral  . Upper gastrointestinal endoscopy    . Colonscopy    . Dilation and curettage of uterus    . Av fistula surgery  11/30/11    [facility] - redo fistula  . Hysteroscopy with thermachoice  12/10/2011    Procedure: HYSTEROSCOPY WITH THERMACHOICE;  Surgeon: Betsy Coder, MD;  Location: [facility];  Service: Gynecology;  Laterality: N/A;  . Shuntogram  Dec. 18, 2013    Left arm  . Av fistula placement  06/10/11    Left brachiocephalic AVF   Family History  Problem Relation Age of Onset  . Hypertension Mother   . Heart disease Father     Heart Disease before age 13  . Hypertension Father   . Heart attack Father   . Hypertension Daughter    History  Substance Use Topics  . Smoking status: Current Every Day Smoker -- 0.50 packs/day for 10 years    Types: Cigarettes  . Smokeless tobacco: Never Used  . Alcohol Use: Yes     Comment: ocassional   OB History   Grav Para Term Preterm Abortions TAB SAB Ect Mult Living  Review of Systems  Neurological: Positive for headaches.  All other systems reviewed and are negative.    Allergies  Clarithromycin; Demerol; Dilaudid; Fentanyl; Morphine and related; Nsaids; and Other  Home Medications   Current Outpatient Rx  Name  Route  Sig  Dispense  Refill  . acetaminophen (TYLENOL) 500 MG tablet   Oral   Take 1,000 mg by mouth daily as needed. For headache         . albuterol (PROVENTIL HFA;VENTOLIN HFA) 108 (90 BASE) MCG/ACT inhaler   Inhalation   Inhale 2 puffs into the lungs every 6 (six) hours as needed. For shortness of breath          . allopurinol (ZYLOPRIM) 300 MG tablet   Oral   Take 300 mg by mouth daily.          Marland Kitchen ALPRAZolam (XANAX) 0.5 MG tablet   Oral   Take 0.5 mg by mouth 3 (three) times daily.          . budesonide-formoterol (SYMBICORT) 160-4.5 MCG/ACT inhaler   Inhalation   Inhale 2 puffs into the lungs 2 (two) times daily.         . calcium acetate (PHOSLO) 667 MG  capsule   Oral   Take 667 mg by mouth 3 (three) times daily with meals.         . calcium carbonate (TUMS - DOSED IN MG ELEMENTAL CALCIUM) 500 MG chewable tablet   Oral   Chew 2 tablets by mouth 3 (three) times daily as needed for heartburn.         . citalopram (CELEXA) 20 MG tablet   Oral   Take 20 mg by mouth daily.          . cyclobenzaprine (FLEXERIL) 10 MG tablet   Oral   Take 10 mg by mouth 2 (two) times daily as needed for muscle spasms. For muscle spasms         . diclofenac sodium (VOLTAREN) 1 % GEL   Topical   Apply 2 g topically 4 (four) times daily.         Marland Kitchen esomeprazole (NEXIUM) 40 MG capsule   Oral   Take 40 mg by mouth daily.          . furosemide (LASIX) 80 MG tablet   Oral   Take 80 mg by mouth 2 (two) times daily.          Marland Kitchen HYDROcodone-acetaminophen (NORCO/VICODIN) 5-325 MG per tablet   Oral   Take 1 tablet by mouth every 6 (six) hours as needed for pain.         Marland Kitchen ibuprofen (ADVIL,MOTRIN) 200 MG tablet   Oral   Take 400 mg by mouth every 6 (six) hours as needed. For pain         . lidocaine-prilocaine (EMLA) cream   Topical   Apply 1 application topically 3 (three) times a week. For dialysis port.         Marland Kitchen lisinopril (PRINIVIL,ZESTRIL) 20 MG tablet   Oral   Take 20 mg by mouth daily.          . methocarbamol (ROBAXIN) 500 MG tablet   Oral   Take 500 mg by mouth 2 (two) times daily.          . montelukast (SINGULAIR) 10 MG tablet   Oral   Take 10 mg by mouth every morning.          . multivitamin (RENA-VIT) TABS  tablet   Oral   Take 1 tablet by mouth daily.         Marland Kitchen oxymorphone (OPANA ER) 20 MG 12 hr tablet   Oral   Take 20 mg by mouth every 12 (twelve) hours.         . promethazine (PHENERGAN) 25 MG tablet   Oral   Take 25 mg by mouth every 6 (six) hours as needed. For nausea          BP 229/125  Pulse 115  Temp(Src) 98.2 F (36.8 C) (Oral)  Resp 18  Wt 243 lb 8 oz (110.451 kg)  SpO2 98%   LMP 09/22/2013 Physical Exam  Nursing note and vitals reviewed. Constitutional: She is oriented to person, place, and time. She appears well-developed and well-nourished. No distress.  HENT:  Head: Normocephalic and atraumatic.  Mouth/Throat: Oropharynx is clear and moist.  The left upper dentition is noted to have multiple caries. There is no obvious abscess.  There is tenderness to palpation in the left cheek extending into the left temple. There is no obvious redness or crepitus or other abnormality.  Neck: Normal range of motion. Neck supple.  Cardiovascular: Normal rate and regular rhythm.  Exam reveals no gallop and no friction rub.   No murmur heard. Pulmonary/Chest: Effort normal and breath sounds normal. No respiratory distress. She has no wheezes.  Abdominal: Soft. Bowel sounds are normal. She exhibits no distension. There is no tenderness.  Musculoskeletal: Normal range of motion.  Neurological: She is alert and oriented to person, place, and time.  Skin: Skin is warm and dry. She is not diaphoretic.    ED Course  Procedures (including critical care time) Labs Review Labs Reviewed  CBC - Abnormal; Notable for the following:    WBC 12.5 (*)    RBC 3.05 (*)    Hemoglobin 10.3 (*)    HCT 30.7 (*)    MCV 100.7 (*)    RDW 16.8 (*)    All other components within normal limits  POCT I-STAT, CHEM 8 - Abnormal; Notable for the following:    BUN 61 (*)    Creatinine, Ser 7.50 (*)    Glucose, Bld 118 (*)    Hemoglobin 11.9 (*)    HCT 35.0 (*)    All other components within normal limits  CULTURE, GROUP A STREP   Imaging Review Ct Maxillofacial W/cm  09/25/2013   CLINICAL DATA:  Left facial swelling, headache  EXAM: CT MAXILLOFACIAL WITH CONTRAST  TECHNIQUE: Multidetector CT imaging of the maxillofacial structures was performed with intravenous contrast. Multiplanar CT image reconstructions were also generated. A small metallic BB was placed on the right temple in order to  reliably differentiate right from left.  CONTRAST:  57mL OMNIPAQUE IOHEXOL 300 MG/ML  SOLN  COMPARISON:  None.  FINDINGS: Visualized paranasal sinuses are essentially clear. The mastoid air cells are unopacified.  No evidence of odontogenic abscess.  The bilateral orbits, including the globes and retrocrural soft tissues, are within normal limits.  Mild soft tissue swelling/subcutaneous stranding overlying (and possibly involving) the left parotid gland (series 2/images 36 and 41). This appearance favors cellulitis or possibly sequela of prior trauma, although mild parotitis is not excluded.  Visualized brain parenchyma is within normal limits.  Small bilateral cervical lymph nodes measuring up to 7 mm short axis, likely reactive. 7 mm short axis left parotid node (series 2/image 39).  Visualized upper cervical spine is within normal limits.  IMPRESSION: Mild subcutaneous stranding/soft  tissue swelling overlying the left parotid gland.  This appearance favors cellulitis or possibly sequela of prior trauma, although mild parotitis is not excluded.   Electronically Signed   By: Julian Hy M.D.   On: 09/25/2013 18:32      MDM  No diagnosis found. Patient was sent here for evaluation of possible orbital cellulitis. Workup reveals white count of 12.5 with CT showing inflammation overlying the left parotid gland this appears to be parostitis and will be treated with Augmentin. She appears nontoxic and afebrile medically stable for discharge with outpatient treatment. She is to return if she vomits runs a high fever or develops other new or concerning problems.    Veryl Speak, MD 09/25/13 [phone]

## 2013-09-25 NOTE — ED Notes (Addendum)
C/o cold sx States she is dialysis patient States she is congested, nasal drip, productive green mucous cough   States she has left side facial swelling benadryl was taking but no relief.   Does have a little tooth pain Dentist appt December 3

## 2013-09-25 NOTE — ED Notes (Signed)
Pt c/o pain to left side of face, reports she thought she had a tooth abscess. Went to urgent care, they sent here her for further evaluation. sts that yesterday her left eye was swollen shut and now is worse, along with worsening in pain. Also sts she is slightly lightheaded. Didn't go to dialysis on Saturday because she didn't feel like it. Pt also sts that the whole left side of her face is swollen, started yesterday. Denies numbness/tingling. No facial droop, no slurred speech. Equal hand grips.

## 2013-09-25 NOTE — ED Provider Notes (Signed)
[name] is a 42 y.o. female who presents to Urgent Care today for severe left-sided facial pain and swelling. Has been present for the last several days and worsening. She notes yesterday she developed left-sided facial swelling. Her eyes swelled so badly that it was closed. The swelling has improved a bit however the pain has worsened. She notes significant temporal pain as well as pain with eye motion. She denies any significant blurry vision. She's tried Tylenol and ibuprofen which have not been very effective. She additionally has poor dentition and a broken upper left tooth. She is an appointment with her dentist scheduled December 3rd. No nausea vomiting diarrhea chest pains or trouble breathing.   She is a dialysis patient on a Tuesday, Thursday, Saturday schedule. She missed her dialysis appointment today to go to the urgent care.    Past Medical History  Diagnosis Date  . Anemia   . Depression   . Reflux   . Dizziness   . Anxiety   . ADD (attention deficit disorder)   . Asthma   . Headache(784.0)   . Gout   . Anemia   . GERD (gastroesophageal reflux disease)   . Obesity   . Gastritis   . Blood transfusion 06/2011    6 units transfused at [facility]  . Arthritis     knee, wrist, back   . Bronchitis   . Shortness of breath   . Sleep apnea     does not use CPAP  . Neuromuscular disorder     carpal tunnel  . Chronic kidney disease     Atrophic left kidney, sees Dr. Hassell Done  . Hypertension     sees Dr. Wendie Agreste   History  Substance Use Topics  . Smoking status: Current Every Day Smoker -- 0.50 packs/day for 10 years    Types: Cigarettes  . Smokeless tobacco: Never Used  . Alcohol Use: Yes     Comment: ocassional   ROS as above Medications reviewed. No current facility-administered medications for this encounter.   Current Outpatient Prescriptions  Medication Sig Dispense Refill  . acetaminophen (TYLENOL) 500 MG tablet Take 1,000 mg by mouth daily as  needed. For headache      . albuterol (PROVENTIL HFA;VENTOLIN HFA) 108 (90 BASE) MCG/ACT inhaler Inhale 2 puffs into the lungs every 6 (six) hours as needed. For shortness of breath       . allopurinol (ZYLOPRIM) 300 MG tablet Take 300 mg by mouth daily.       Marland Kitchen ALPRAZolam (XANAX) 0.5 MG tablet Take 0.5 mg by mouth 3 (three) times daily.       . budesonide-formoterol (SYMBICORT) 160-4.5 MCG/ACT inhaler Inhale 2 puffs into the lungs 2 (two) times daily.      . calcium acetate (PHOSLO) 667 MG capsule Take 667 mg by mouth 3 (three) times daily with meals.      . calcium carbonate (TUMS - DOSED IN MG ELEMENTAL CALCIUM) 500 MG chewable tablet Chew 2 tablets by mouth 3 (three) times daily as needed for heartburn.      . citalopram (CELEXA) 20 MG tablet Take 40 mg by mouth daily.       . cyclobenzaprine (FLEXERIL) 10 MG tablet Take 10 mg by mouth 2 (two) times daily as needed for muscle spasms. For muscle spasms      . diclofenac (VOLTAREN) 50 MG EC tablet Take 50 mg by mouth daily.       Marland Kitchen esomeprazole (NEXIUM) 40 MG capsule  Take 40 mg by mouth daily.       . furosemide (LASIX) 80 MG tablet Take 80 mg by mouth every other day.       Marland Kitchen HYDROcodone-acetaminophen (NORCO/VICODIN) 5-325 MG per tablet Take 1 tablet by mouth every 6 (six) hours as needed for pain.      Marland Kitchen ibuprofen (ADVIL,MOTRIN) 200 MG tablet Take 400 mg by mouth every 6 (six) hours as needed. For pain      . lidocaine-prilocaine (EMLA) cream Apply 1 application topically 3 (three) times a week. For dialysis port.      Marland Kitchen lisinopril (PRINIVIL,ZESTRIL) 20 MG tablet Take 20 mg by mouth daily.       . methocarbamol (ROBAXIN) 500 MG tablet Take 500 mg by mouth 2 (two) times daily.       . montelukast (SINGULAIR) 10 MG tablet Take 10 mg by mouth every morning.       . multivitamin (RENA-VIT) TABS tablet Take 1 tablet by mouth daily.      . promethazine (PHENERGAN) 25 MG tablet Take 25 mg by mouth every 6 (six) hours as needed. For nausea         Exam:  BP 172/98  Pulse 96  Temp(Src) 98.1 F (36.7 C) (Oral)  Resp 20  SpO2 100%  LMP 09/22/2013 Gen: Well NAD, morbidly obese HEENT: ,  MMM, mild left eye swelling. PERRLA. Extraocular motion is intact over significant pain with left eye lateral upper deviation.  Tender to palpation temporal region. Teeth: Poor dentition with broken rear uppermost teeth. Not tender to touch. Lungs: Normal work of breathing. CTABL Heart: RRR no MRG Abd: NABS, Soft. NT, ND Exts: , warm and well perfused.  Intact left upper extremity AV fistula   Assessment and Plan: 42 y.o. female with left-sided facial pain associated with swelling.  Patient's degree of pain is concerning. Additionally she has significant pain with eye motion. This is concerning for orbital cellulitis. Additionally her temporal tenderness is concerning for temporal arteritis. [name] is a dialysis patient results in an inflammatory state.  Plan transfer to the emergency room for further evaluation and management. Her nephrologist is Dr. Justin Mend She attends [facility] dialysis center in [facility] Tuesday, Thursday, Saturday schedule.     Gregor Hams, MD 09/25/13 [phone]

## 2013-09-27 LAB — CULTURE, GROUP A STREP

## 2013-10-03 DIAGNOSIS — Z23 Encounter for immunization: Secondary | ICD-10-CM | POA: Diagnosis not present

## 2013-10-03 DIAGNOSIS — E876 Hypokalemia: Secondary | ICD-10-CM | POA: Diagnosis not present

## 2013-10-03 DIAGNOSIS — N186 End stage renal disease: Secondary | ICD-10-CM | POA: Diagnosis not present

## 2013-10-03 DIAGNOSIS — D631 Anemia in chronic kidney disease: Secondary | ICD-10-CM | POA: Diagnosis not present

## 2013-10-03 DIAGNOSIS — Z992 Dependence on renal dialysis: Secondary | ICD-10-CM | POA: Diagnosis not present

## 2013-10-03 DIAGNOSIS — D509 Iron deficiency anemia, unspecified: Secondary | ICD-10-CM | POA: Diagnosis not present

## 2013-10-04 DIAGNOSIS — Z992 Dependence on renal dialysis: Secondary | ICD-10-CM | POA: Diagnosis not present

## 2013-10-04 DIAGNOSIS — D631 Anemia in chronic kidney disease: Secondary | ICD-10-CM | POA: Diagnosis not present

## 2013-10-04 DIAGNOSIS — N186 End stage renal disease: Secondary | ICD-10-CM | POA: Diagnosis not present

## 2013-10-04 DIAGNOSIS — D509 Iron deficiency anemia, unspecified: Secondary | ICD-10-CM | POA: Diagnosis not present

## 2013-10-04 DIAGNOSIS — Z23 Encounter for immunization: Secondary | ICD-10-CM | POA: Diagnosis not present

## 2013-10-04 DIAGNOSIS — E876 Hypokalemia: Secondary | ICD-10-CM | POA: Diagnosis not present

## 2013-10-10 DIAGNOSIS — N186 End stage renal disease: Secondary | ICD-10-CM | POA: Diagnosis not present

## 2013-10-10 DIAGNOSIS — D631 Anemia in chronic kidney disease: Secondary | ICD-10-CM | POA: Diagnosis not present

## 2013-10-10 DIAGNOSIS — Z992 Dependence on renal dialysis: Secondary | ICD-10-CM | POA: Diagnosis not present

## 2013-10-10 DIAGNOSIS — Z23 Encounter for immunization: Secondary | ICD-10-CM | POA: Diagnosis not present

## 2013-10-10 DIAGNOSIS — E876 Hypokalemia: Secondary | ICD-10-CM | POA: Diagnosis not present

## 2013-10-10 DIAGNOSIS — D509 Iron deficiency anemia, unspecified: Secondary | ICD-10-CM | POA: Diagnosis not present

## 2013-10-11 DIAGNOSIS — Z23 Encounter for immunization: Secondary | ICD-10-CM | POA: Diagnosis not present

## 2013-10-11 DIAGNOSIS — D509 Iron deficiency anemia, unspecified: Secondary | ICD-10-CM | POA: Diagnosis not present

## 2013-10-11 DIAGNOSIS — Z992 Dependence on renal dialysis: Secondary | ICD-10-CM | POA: Diagnosis not present

## 2013-10-11 DIAGNOSIS — D631 Anemia in chronic kidney disease: Secondary | ICD-10-CM | POA: Diagnosis not present

## 2013-10-11 DIAGNOSIS — N186 End stage renal disease: Secondary | ICD-10-CM | POA: Diagnosis not present

## 2013-10-11 DIAGNOSIS — E876 Hypokalemia: Secondary | ICD-10-CM | POA: Diagnosis not present

## 2013-10-12 DIAGNOSIS — M5380 Other specified dorsopathies, site unspecified: Secondary | ICD-10-CM | POA: Diagnosis not present

## 2013-10-12 DIAGNOSIS — G894 Chronic pain syndrome: Secondary | ICD-10-CM | POA: Diagnosis not present

## 2013-10-12 DIAGNOSIS — M5137 Other intervertebral disc degeneration, lumbosacral region: Secondary | ICD-10-CM | POA: Diagnosis not present

## 2013-10-12 DIAGNOSIS — IMO0002 Reserved for concepts with insufficient information to code with codable children: Secondary | ICD-10-CM | POA: Diagnosis not present

## 2013-10-17 DIAGNOSIS — Z992 Dependence on renal dialysis: Secondary | ICD-10-CM | POA: Diagnosis not present

## 2013-10-17 DIAGNOSIS — D631 Anemia in chronic kidney disease: Secondary | ICD-10-CM | POA: Diagnosis not present

## 2013-10-17 DIAGNOSIS — E876 Hypokalemia: Secondary | ICD-10-CM | POA: Diagnosis not present

## 2013-10-17 DIAGNOSIS — D509 Iron deficiency anemia, unspecified: Secondary | ICD-10-CM | POA: Diagnosis not present

## 2013-10-17 DIAGNOSIS — Z23 Encounter for immunization: Secondary | ICD-10-CM | POA: Diagnosis not present

## 2013-10-17 DIAGNOSIS — N186 End stage renal disease: Secondary | ICD-10-CM | POA: Diagnosis not present

## 2013-10-18 DIAGNOSIS — I10 Essential (primary) hypertension: Secondary | ICD-10-CM | POA: Diagnosis not present

## 2013-10-18 DIAGNOSIS — E119 Type 2 diabetes mellitus without complications: Secondary | ICD-10-CM | POA: Diagnosis not present

## 2013-10-18 DIAGNOSIS — F411 Generalized anxiety disorder: Secondary | ICD-10-CM | POA: Diagnosis not present

## 2013-10-18 DIAGNOSIS — Z79899 Other long term (current) drug therapy: Secondary | ICD-10-CM | POA: Diagnosis not present

## 2013-10-18 DIAGNOSIS — E785 Hyperlipidemia, unspecified: Secondary | ICD-10-CM | POA: Diagnosis not present

## 2013-10-30 DIAGNOSIS — N186 End stage renal disease: Secondary | ICD-10-CM | POA: Diagnosis not present

## 2013-10-30 DIAGNOSIS — Z23 Encounter for immunization: Secondary | ICD-10-CM | POA: Diagnosis not present

## 2013-10-30 DIAGNOSIS — D509 Iron deficiency anemia, unspecified: Secondary | ICD-10-CM | POA: Diagnosis not present

## 2013-10-30 DIAGNOSIS — E876 Hypokalemia: Secondary | ICD-10-CM | POA: Diagnosis not present

## 2013-10-30 DIAGNOSIS — D631 Anemia in chronic kidney disease: Secondary | ICD-10-CM | POA: Diagnosis not present

## 2013-10-30 DIAGNOSIS — Z992 Dependence on renal dialysis: Secondary | ICD-10-CM | POA: Diagnosis not present

## 2013-10-31 DIAGNOSIS — N186 End stage renal disease: Secondary | ICD-10-CM | POA: Diagnosis not present

## 2013-11-12 DIAGNOSIS — D631 Anemia in chronic kidney disease: Secondary | ICD-10-CM | POA: Diagnosis not present

## 2013-11-12 DIAGNOSIS — N039 Chronic nephritic syndrome with unspecified morphologic changes: Secondary | ICD-10-CM | POA: Diagnosis not present

## 2013-11-12 DIAGNOSIS — D509 Iron deficiency anemia, unspecified: Secondary | ICD-10-CM | POA: Diagnosis not present

## 2013-11-12 DIAGNOSIS — N186 End stage renal disease: Secondary | ICD-10-CM | POA: Diagnosis not present

## 2013-11-13 DIAGNOSIS — N186 End stage renal disease: Secondary | ICD-10-CM | POA: Diagnosis not present

## 2013-11-13 DIAGNOSIS — D631 Anemia in chronic kidney disease: Secondary | ICD-10-CM | POA: Diagnosis not present

## 2013-11-13 DIAGNOSIS — D509 Iron deficiency anemia, unspecified: Secondary | ICD-10-CM | POA: Diagnosis not present

## 2013-11-14 DIAGNOSIS — IMO0001 Reserved for inherently not codable concepts without codable children: Secondary | ICD-10-CM | POA: Diagnosis not present

## 2013-11-14 DIAGNOSIS — G894 Chronic pain syndrome: Secondary | ICD-10-CM | POA: Diagnosis not present

## 2013-11-14 DIAGNOSIS — M5137 Other intervertebral disc degeneration, lumbosacral region: Secondary | ICD-10-CM | POA: Diagnosis not present

## 2013-11-14 DIAGNOSIS — IMO0002 Reserved for concepts with insufficient information to code with codable children: Secondary | ICD-10-CM | POA: Diagnosis not present

## 2013-11-14 DIAGNOSIS — M5380 Other specified dorsopathies, site unspecified: Secondary | ICD-10-CM | POA: Diagnosis not present

## 2013-11-20 DIAGNOSIS — L03211 Cellulitis of face: Secondary | ICD-10-CM | POA: Diagnosis not present

## 2013-11-20 DIAGNOSIS — N189 Chronic kidney disease, unspecified: Secondary | ICD-10-CM | POA: Diagnosis not present

## 2013-11-29 DIAGNOSIS — D509 Iron deficiency anemia, unspecified: Secondary | ICD-10-CM | POA: Diagnosis not present

## 2013-11-29 DIAGNOSIS — N186 End stage renal disease: Secondary | ICD-10-CM | POA: Diagnosis not present

## 2013-11-29 DIAGNOSIS — D631 Anemia in chronic kidney disease: Secondary | ICD-10-CM | POA: Diagnosis not present

## 2013-12-01 DIAGNOSIS — N186 End stage renal disease: Secondary | ICD-10-CM | POA: Diagnosis not present

## 2013-12-11 DIAGNOSIS — D509 Iron deficiency anemia, unspecified: Secondary | ICD-10-CM | POA: Diagnosis not present

## 2013-12-11 DIAGNOSIS — N039 Chronic nephritic syndrome with unspecified morphologic changes: Secondary | ICD-10-CM | POA: Diagnosis not present

## 2013-12-11 DIAGNOSIS — N186 End stage renal disease: Secondary | ICD-10-CM | POA: Diagnosis not present

## 2013-12-11 DIAGNOSIS — D631 Anemia in chronic kidney disease: Secondary | ICD-10-CM | POA: Diagnosis not present

## 2013-12-13 DIAGNOSIS — D631 Anemia in chronic kidney disease: Secondary | ICD-10-CM | POA: Diagnosis not present

## 2013-12-13 DIAGNOSIS — N039 Chronic nephritic syndrome with unspecified morphologic changes: Secondary | ICD-10-CM | POA: Diagnosis not present

## 2013-12-13 DIAGNOSIS — N186 End stage renal disease: Secondary | ICD-10-CM | POA: Diagnosis not present

## 2013-12-13 DIAGNOSIS — D509 Iron deficiency anemia, unspecified: Secondary | ICD-10-CM | POA: Diagnosis not present

## 2013-12-14 DIAGNOSIS — G894 Chronic pain syndrome: Secondary | ICD-10-CM | POA: Diagnosis not present

## 2013-12-14 DIAGNOSIS — IMO0002 Reserved for concepts with insufficient information to code with codable children: Secondary | ICD-10-CM | POA: Diagnosis not present

## 2013-12-14 DIAGNOSIS — IMO0001 Reserved for inherently not codable concepts without codable children: Secondary | ICD-10-CM | POA: Diagnosis not present

## 2013-12-14 DIAGNOSIS — M47817 Spondylosis without myelopathy or radiculopathy, lumbosacral region: Secondary | ICD-10-CM | POA: Diagnosis not present

## 2013-12-14 DIAGNOSIS — M5137 Other intervertebral disc degeneration, lumbosacral region: Secondary | ICD-10-CM | POA: Diagnosis not present

## 2013-12-28 DIAGNOSIS — N186 End stage renal disease: Secondary | ICD-10-CM | POA: Diagnosis not present

## 2013-12-28 DIAGNOSIS — D631 Anemia in chronic kidney disease: Secondary | ICD-10-CM | POA: Diagnosis not present

## 2013-12-28 DIAGNOSIS — D509 Iron deficiency anemia, unspecified: Secondary | ICD-10-CM | POA: Diagnosis not present

## 2013-12-29 DIAGNOSIS — N186 End stage renal disease: Secondary | ICD-10-CM | POA: Diagnosis not present

## 2013-12-29 DIAGNOSIS — D631 Anemia in chronic kidney disease: Secondary | ICD-10-CM | POA: Diagnosis not present

## 2013-12-29 DIAGNOSIS — D509 Iron deficiency anemia, unspecified: Secondary | ICD-10-CM | POA: Diagnosis not present

## 2014-01-03 DIAGNOSIS — D631 Anemia in chronic kidney disease: Secondary | ICD-10-CM | POA: Diagnosis not present

## 2014-01-03 DIAGNOSIS — N186 End stage renal disease: Secondary | ICD-10-CM | POA: Diagnosis not present

## 2014-01-03 DIAGNOSIS — D509 Iron deficiency anemia, unspecified: Secondary | ICD-10-CM | POA: Diagnosis not present

## 2014-01-09 DIAGNOSIS — D631 Anemia in chronic kidney disease: Secondary | ICD-10-CM | POA: Diagnosis not present

## 2014-01-09 DIAGNOSIS — N186 End stage renal disease: Secondary | ICD-10-CM | POA: Diagnosis not present

## 2014-01-09 DIAGNOSIS — D509 Iron deficiency anemia, unspecified: Secondary | ICD-10-CM | POA: Diagnosis not present

## 2014-01-10 DIAGNOSIS — N186 End stage renal disease: Secondary | ICD-10-CM | POA: Diagnosis not present

## 2014-01-10 DIAGNOSIS — D631 Anemia in chronic kidney disease: Secondary | ICD-10-CM | POA: Diagnosis not present

## 2014-01-10 DIAGNOSIS — D509 Iron deficiency anemia, unspecified: Secondary | ICD-10-CM | POA: Diagnosis not present

## 2014-01-10 DIAGNOSIS — N039 Chronic nephritic syndrome with unspecified morphologic changes: Secondary | ICD-10-CM | POA: Diagnosis not present

## 2014-01-11 DIAGNOSIS — M47817 Spondylosis without myelopathy or radiculopathy, lumbosacral region: Secondary | ICD-10-CM | POA: Diagnosis not present

## 2014-01-11 DIAGNOSIS — G894 Chronic pain syndrome: Secondary | ICD-10-CM | POA: Diagnosis not present

## 2014-01-11 DIAGNOSIS — IMO0002 Reserved for concepts with insufficient information to code with codable children: Secondary | ICD-10-CM | POA: Diagnosis not present

## 2014-01-11 DIAGNOSIS — M5137 Other intervertebral disc degeneration, lumbosacral region: Secondary | ICD-10-CM | POA: Diagnosis not present

## 2014-01-23 DIAGNOSIS — D631 Anemia in chronic kidney disease: Secondary | ICD-10-CM | POA: Diagnosis not present

## 2014-01-23 DIAGNOSIS — D509 Iron deficiency anemia, unspecified: Secondary | ICD-10-CM | POA: Diagnosis not present

## 2014-01-23 DIAGNOSIS — N186 End stage renal disease: Secondary | ICD-10-CM | POA: Diagnosis not present

## 2014-01-24 DIAGNOSIS — N186 End stage renal disease: Secondary | ICD-10-CM | POA: Diagnosis not present

## 2014-01-24 DIAGNOSIS — D509 Iron deficiency anemia, unspecified: Secondary | ICD-10-CM | POA: Diagnosis not present

## 2014-01-24 DIAGNOSIS — D631 Anemia in chronic kidney disease: Secondary | ICD-10-CM | POA: Diagnosis not present

## 2014-01-29 DIAGNOSIS — N186 End stage renal disease: Secondary | ICD-10-CM | POA: Diagnosis not present

## 2014-02-06 DIAGNOSIS — D509 Iron deficiency anemia, unspecified: Secondary | ICD-10-CM | POA: Diagnosis not present

## 2014-02-06 DIAGNOSIS — D631 Anemia in chronic kidney disease: Secondary | ICD-10-CM | POA: Diagnosis not present

## 2014-02-06 DIAGNOSIS — N186 End stage renal disease: Secondary | ICD-10-CM | POA: Diagnosis not present

## 2014-02-07 DIAGNOSIS — N186 End stage renal disease: Secondary | ICD-10-CM | POA: Diagnosis not present

## 2014-02-07 DIAGNOSIS — D631 Anemia in chronic kidney disease: Secondary | ICD-10-CM | POA: Diagnosis not present

## 2014-02-07 DIAGNOSIS — N039 Chronic nephritic syndrome with unspecified morphologic changes: Secondary | ICD-10-CM | POA: Diagnosis not present

## 2014-02-07 DIAGNOSIS — D509 Iron deficiency anemia, unspecified: Secondary | ICD-10-CM | POA: Diagnosis not present

## 2014-02-11 DIAGNOSIS — N039 Chronic nephritic syndrome with unspecified morphologic changes: Secondary | ICD-10-CM | POA: Diagnosis not present

## 2014-02-11 DIAGNOSIS — D631 Anemia in chronic kidney disease: Secondary | ICD-10-CM | POA: Diagnosis not present

## 2014-02-11 DIAGNOSIS — D509 Iron deficiency anemia, unspecified: Secondary | ICD-10-CM | POA: Diagnosis not present

## 2014-02-11 DIAGNOSIS — N186 End stage renal disease: Secondary | ICD-10-CM | POA: Diagnosis not present

## 2014-02-12 DIAGNOSIS — N390 Urinary tract infection, site not specified: Secondary | ICD-10-CM | POA: Diagnosis not present

## 2014-02-12 DIAGNOSIS — N186 End stage renal disease: Secondary | ICD-10-CM | POA: Diagnosis not present

## 2014-02-12 DIAGNOSIS — D509 Iron deficiency anemia, unspecified: Secondary | ICD-10-CM | POA: Diagnosis not present

## 2014-02-12 DIAGNOSIS — N039 Chronic nephritic syndrome with unspecified morphologic changes: Secondary | ICD-10-CM | POA: Diagnosis not present

## 2014-02-12 DIAGNOSIS — D631 Anemia in chronic kidney disease: Secondary | ICD-10-CM | POA: Diagnosis not present

## 2014-02-13 DIAGNOSIS — G894 Chronic pain syndrome: Secondary | ICD-10-CM | POA: Diagnosis not present

## 2014-02-13 DIAGNOSIS — M47817 Spondylosis without myelopathy or radiculopathy, lumbosacral region: Secondary | ICD-10-CM | POA: Diagnosis not present

## 2014-02-13 DIAGNOSIS — M5137 Other intervertebral disc degeneration, lumbosacral region: Secondary | ICD-10-CM | POA: Diagnosis not present

## 2014-02-13 DIAGNOSIS — IMO0002 Reserved for concepts with insufficient information to code with codable children: Secondary | ICD-10-CM | POA: Diagnosis not present

## 2014-02-14 ENCOUNTER — Inpatient Hospital Stay (HOSPITAL_COMMUNITY)
Admission: EM | Admit: 2014-02-14 | Discharge: 2014-02-17 | DRG: 871 | Disposition: A | Discharge status: Home Health | Payer: Medicare Other | Attending: Internal Medicine | Admitting: Internal Medicine

## 2014-02-14 ENCOUNTER — Encounter (HOSPITAL_COMMUNITY): Payer: Self-pay | Admitting: Emergency Medicine

## 2014-02-14 DIAGNOSIS — N039 Chronic nephritic syndrome with unspecified morphologic changes: Secondary | ICD-10-CM | POA: Diagnosis present

## 2014-02-14 DIAGNOSIS — M109 Gout, unspecified: Secondary | ICD-10-CM | POA: Diagnosis present

## 2014-02-14 DIAGNOSIS — L0390 Cellulitis, unspecified: Secondary | ICD-10-CM | POA: Diagnosis not present

## 2014-02-14 DIAGNOSIS — F17200 Nicotine dependence, unspecified, uncomplicated: Secondary | ICD-10-CM | POA: Diagnosis present

## 2014-02-14 DIAGNOSIS — E876 Hypokalemia: Secondary | ICD-10-CM | POA: Diagnosis present

## 2014-02-14 DIAGNOSIS — D649 Anemia, unspecified: Secondary | ICD-10-CM

## 2014-02-14 DIAGNOSIS — G4730 Sleep apnea, unspecified: Secondary | ICD-10-CM | POA: Diagnosis present

## 2014-02-14 DIAGNOSIS — N179 Acute kidney failure, unspecified: Secondary | ICD-10-CM

## 2014-02-14 DIAGNOSIS — L03319 Cellulitis of trunk, unspecified: Secondary | ICD-10-CM

## 2014-02-14 DIAGNOSIS — K219 Gastro-esophageal reflux disease without esophagitis: Secondary | ICD-10-CM | POA: Diagnosis present

## 2014-02-14 DIAGNOSIS — Z8249 Family history of ischemic heart disease and other diseases of the circulatory system: Secondary | ICD-10-CM

## 2014-02-14 DIAGNOSIS — Z881 Allergy status to other antibiotic agents status: Secondary | ICD-10-CM

## 2014-02-14 DIAGNOSIS — I10 Essential (primary) hypertension: Secondary | ICD-10-CM | POA: Diagnosis present

## 2014-02-14 DIAGNOSIS — I120 Hypertensive chronic kidney disease with stage 5 chronic kidney disease or end stage renal disease: Secondary | ICD-10-CM | POA: Diagnosis present

## 2014-02-14 DIAGNOSIS — Z01818 Encounter for other preprocedural examination: Secondary | ICD-10-CM | POA: Diagnosis not present

## 2014-02-14 DIAGNOSIS — L02818 Cutaneous abscess of other sites: Secondary | ICD-10-CM | POA: Diagnosis not present

## 2014-02-14 DIAGNOSIS — J45909 Unspecified asthma, uncomplicated: Secondary | ICD-10-CM | POA: Diagnosis present

## 2014-02-14 DIAGNOSIS — D631 Anemia in chronic kidney disease: Secondary | ICD-10-CM | POA: Diagnosis present

## 2014-02-14 DIAGNOSIS — N2581 Secondary hyperparathyroidism of renal origin: Secondary | ICD-10-CM | POA: Diagnosis present

## 2014-02-14 DIAGNOSIS — Z79899 Other long term (current) drug therapy: Secondary | ICD-10-CM

## 2014-02-14 DIAGNOSIS — F3289 Other specified depressive episodes: Secondary | ICD-10-CM | POA: Diagnosis present

## 2014-02-14 DIAGNOSIS — L02219 Cutaneous abscess of trunk, unspecified: Secondary | ICD-10-CM | POA: Diagnosis present

## 2014-02-14 DIAGNOSIS — Z886 Allergy status to analgesic agent status: Secondary | ICD-10-CM

## 2014-02-14 DIAGNOSIS — Z6841 Body Mass Index (BMI) 40.0 and over, adult: Secondary | ICD-10-CM

## 2014-02-14 DIAGNOSIS — F329 Major depressive disorder, single episode, unspecified: Secondary | ICD-10-CM | POA: Diagnosis present

## 2014-02-14 DIAGNOSIS — Z992 Dependence on renal dialysis: Secondary | ICD-10-CM | POA: Diagnosis not present

## 2014-02-14 DIAGNOSIS — N186 End stage renal disease: Secondary | ICD-10-CM | POA: Diagnosis not present

## 2014-02-14 DIAGNOSIS — T82598A Other mechanical complication of other cardiac and vascular devices and implants, initial encounter: Secondary | ICD-10-CM

## 2014-02-14 DIAGNOSIS — Z885 Allergy status to narcotic agent status: Secondary | ICD-10-CM

## 2014-02-14 DIAGNOSIS — E8779 Other fluid overload: Secondary | ICD-10-CM | POA: Diagnosis not present

## 2014-02-14 DIAGNOSIS — Z9889 Other specified postprocedural states: Secondary | ICD-10-CM

## 2014-02-14 DIAGNOSIS — M65072 Abscess of tendon sheath, left ankle and foot: Secondary | ICD-10-CM | POA: Diagnosis present

## 2014-02-14 DIAGNOSIS — A419 Sepsis, unspecified organism: Secondary | ICD-10-CM | POA: Diagnosis not present

## 2014-02-14 DIAGNOSIS — F411 Generalized anxiety disorder: Secondary | ICD-10-CM | POA: Diagnosis present

## 2014-02-14 DIAGNOSIS — N189 Chronic kidney disease, unspecified: Secondary | ICD-10-CM

## 2014-02-14 DIAGNOSIS — T368X5A Adverse effect of other systemic antibiotics, initial encounter: Secondary | ICD-10-CM | POA: Diagnosis not present

## 2014-02-14 DIAGNOSIS — Y9210 Unspecified residential institution as the place of occurrence of the external cause: Secondary | ICD-10-CM | POA: Diagnosis not present

## 2014-02-14 DIAGNOSIS — L0291 Cutaneous abscess, unspecified: Secondary | ICD-10-CM | POA: Diagnosis not present

## 2014-02-14 DIAGNOSIS — L270 Generalized skin eruption due to drugs and medicaments taken internally: Secondary | ICD-10-CM | POA: Diagnosis not present

## 2014-02-14 DIAGNOSIS — F988 Other specified behavioral and emotional disorders with onset usually occurring in childhood and adolescence: Secondary | ICD-10-CM | POA: Diagnosis present

## 2014-02-14 DIAGNOSIS — R609 Edema, unspecified: Secondary | ICD-10-CM

## 2014-02-14 DIAGNOSIS — G8929 Other chronic pain: Secondary | ICD-10-CM | POA: Diagnosis present

## 2014-02-14 HISTORY — DX: Abscess of tendon sheath, left ankle and foot: M65.072

## 2014-02-14 LAB — CBC
HCT: 32.4 % — ABNORMAL LOW (ref 36.0–46.0)
Hemoglobin: 10.4 g/dL — ABNORMAL LOW (ref 12.0–15.0)
MCH: 32.7 pg (ref 26.0–34.0)
MCHC: 32.1 g/dL (ref 30.0–36.0)
MCV: 101.9 fL — AB (ref 78.0–100.0)
PLATELETS: 222 10*3/uL (ref 150–400)
RBC: 3.18 MIL/uL — ABNORMAL LOW (ref 3.87–5.11)
RDW: 15.7 % — AB (ref 11.5–15.5)
WBC: 27.8 10*3/uL — AB (ref 4.0–10.5)

## 2014-02-14 LAB — CBC WITH DIFFERENTIAL/PLATELET
Basophils Absolute: 0 10*3/uL (ref 0.0–0.1)
Basophils Relative: 0 % (ref 0–1)
Eosinophils Absolute: 0 10*3/uL (ref 0.0–0.7)
Eosinophils Relative: 0 % (ref 0–5)
HCT: 33.7 % — ABNORMAL LOW (ref 36.0–46.0)
Hemoglobin: 10.9 g/dL — ABNORMAL LOW (ref 12.0–15.0)
Lymphocytes Relative: 9 % — ABNORMAL LOW (ref 12–46)
Lymphs Abs: 2.4 10*3/uL (ref 0.7–4.0)
MCH: 33.2 pg (ref 26.0–34.0)
MCHC: 32.3 g/dL (ref 30.0–36.0)
MCV: 102.7 fL — ABNORMAL HIGH (ref 78.0–100.0)
Monocytes Absolute: 1.1 10*3/uL — ABNORMAL HIGH (ref 0.1–1.0)
Monocytes Relative: 4 % (ref 3–12)
Neutro Abs: 23 10*3/uL — ABNORMAL HIGH (ref 1.7–7.7)
Neutrophils Relative %: 87 % — ABNORMAL HIGH (ref 43–77)
Platelets: 235 10*3/uL (ref 150–400)
RBC: 3.28 MIL/uL — ABNORMAL LOW (ref 3.87–5.11)
RDW: 15.8 % — ABNORMAL HIGH (ref 11.5–15.5)
WBC: 26.5 10*3/uL — ABNORMAL HIGH (ref 4.0–10.5)

## 2014-02-14 LAB — BASIC METABOLIC PANEL
BUN: 34 mg/dL — ABNORMAL HIGH (ref 6–23)
CO2: 25 mEq/L (ref 19–32)
Calcium: 9.6 mg/dL (ref 8.4–10.5)
Chloride: 93 mEq/L — ABNORMAL LOW (ref 96–112)
Creatinine, Ser: 5.86 mg/dL — ABNORMAL HIGH (ref 0.50–1.10)
GFR calc Af Amer: 9 mL/min — ABNORMAL LOW (ref >90)
GFR calc non Af Amer: 8 mL/min — ABNORMAL LOW (ref >90)
Glucose, Bld: 147 mg/dL — ABNORMAL HIGH (ref 70–99)
Potassium: 3.1 mEq/L — ABNORMAL LOW (ref 3.7–5.3)
Sodium: 139 mEq/L (ref 137–147)

## 2014-02-14 LAB — RETICULOCYTES
RBC.: 3.12 MIL/uL — ABNORMAL LOW (ref 3.87–5.11)
RETIC COUNT ABSOLUTE: 96.7 10*3/uL (ref 19.0–186.0)
RETIC CT PCT: 3.1 % (ref 0.4–3.1)

## 2014-02-14 LAB — CREATININE, SERUM
Creatinine, Ser: 5.84 mg/dL — ABNORMAL HIGH (ref 0.50–1.10)
GFR calc Af Amer: 9 mL/min — ABNORMAL LOW (ref >90)
GFR calc non Af Amer: 8 mL/min — ABNORMAL LOW (ref >90)

## 2014-02-14 LAB — MAGNESIUM: Magnesium: 1.9 mg/dL (ref 1.5–2.5)

## 2014-02-14 MED ORDER — PANTOPRAZOLE SODIUM 40 MG PO TBEC
80.0000 mg | DELAYED_RELEASE_TABLET | Freq: Every day | ORAL | Status: DC
  Administered 2014-02-15 – 2014-02-17 (×3): 80 mg via ORAL
  Filled 2014-02-14 (×4): qty 2

## 2014-02-14 MED ORDER — HYDROCODONE-ACETAMINOPHEN 5-325 MG PO TABS
1.0000 | ORAL_TABLET | Freq: Four times a day (QID) | ORAL | Status: DC | PRN
  Administered 2014-02-14 – 2014-02-15 (×4): 1 via ORAL
  Filled 2014-02-14 (×3): qty 1

## 2014-02-14 MED ORDER — CLINDAMYCIN PHOSPHATE 600 MG/50ML IV SOLN
600.0000 mg | Freq: Once | INTRAVENOUS | Status: AC
  Administered 2014-02-14: 600 mg via INTRAVENOUS
  Filled 2014-02-14: qty 50

## 2014-02-14 MED ORDER — DIPHENHYDRAMINE HCL 50 MG/ML IJ SOLN
12.5000 mg | Freq: Once | INTRAMUSCULAR | Status: DC
  Filled 2014-02-14: qty 1

## 2014-02-14 MED ORDER — ACETAMINOPHEN 500 MG PO TABS: 1000.0000 mg | ORAL_TABLET | Freq: Every day | ORAL | Status: DC | PRN

## 2014-02-14 MED ORDER — AMLODIPINE BESYLATE 10 MG PO TABS
10.0000 mg | ORAL_TABLET | Freq: Every day | ORAL | Status: DC
  Administered 2014-02-14 – 2014-02-17 (×4): 10 mg via ORAL
  Filled 2014-02-14 (×4): qty 1

## 2014-02-14 MED ORDER — SUMATRIPTAN SUCCINATE 50 MG PO TABS
50.0000 mg | ORAL_TABLET | Freq: Every day | ORAL | Status: DC | PRN
  Administered 2014-02-14: 50 mg via ORAL
  Filled 2014-02-14: qty 1

## 2014-02-14 MED ORDER — ALBUTEROL SULFATE (2.5 MG/3ML) 0.083% IN NEBU: 2.5000 mg | INHALATION_SOLUTION | Freq: Four times a day (QID) | RESPIRATORY_TRACT | Status: DC | PRN

## 2014-02-14 MED ORDER — SODIUM CHLORIDE 0.9 % IV SOLN
250.0000 mL | INTRAVENOUS | Status: DC | PRN
Start: 2014-02-14 — End: 2014-02-16
  Administered 2014-02-15: 16:00:00 via INTRAVENOUS

## 2014-02-14 MED ORDER — MORPHINE SULFATE ER 30 MG PO TBCR
60.0000 mg | EXTENDED_RELEASE_TABLET | Freq: Two times a day (BID) | ORAL | Status: DC
  Administered 2014-02-14: 60 mg via ORAL
  Filled 2014-02-14: qty 2

## 2014-02-14 MED ORDER — PROMETHAZINE HCL 25 MG PO TABS
25.0000 mg | ORAL_TABLET | Freq: Four times a day (QID) | ORAL | Status: DC | PRN
  Administered 2014-02-16: 02:00:00 25 mg via ORAL
  Filled 2014-02-14 (×3): qty 1

## 2014-02-14 MED ORDER — CLINDAMYCIN PHOSPHATE 600 MG/50ML IV SOLN
600.0000 mg | Freq: Three times a day (TID) | INTRAVENOUS | Status: DC
  Filled 2014-02-14 (×2): qty 50

## 2014-02-14 MED ORDER — DIPHENHYDRAMINE HCL 50 MG/ML IJ SOLN
25.0000 mg | Freq: Once | INTRAMUSCULAR | Status: AC
  Administered 2014-02-14: 25 mg via INTRAVENOUS

## 2014-02-14 MED ORDER — MONTELUKAST SODIUM 10 MG PO TABS
10.0000 mg | ORAL_TABLET | Freq: Every day | ORAL | Status: DC
  Administered 2014-02-15 – 2014-02-17 (×3): 10 mg via ORAL
  Filled 2014-02-14 (×3): qty 1

## 2014-02-14 MED ORDER — BUDESONIDE-FORMOTEROL FUMARATE 160-4.5 MCG/ACT IN AERO
2.0000 | INHALATION_SPRAY | Freq: Two times a day (BID) | RESPIRATORY_TRACT | Status: DC
  Administered 2014-02-15 – 2014-02-17 (×5): 2 via RESPIRATORY_TRACT
  Filled 2014-02-14: qty 6

## 2014-02-14 MED ORDER — MELATONIN 5 MG PO TABS: 5.0000 mg | ORAL_TABLET | Freq: Every day | ORAL | Status: DC

## 2014-02-14 MED ORDER — HEPARIN SODIUM (PORCINE) 5000 UNIT/ML IJ SOLN
5000.0000 [IU] | Freq: Three times a day (TID) | INTRAMUSCULAR | Status: DC
  Administered 2014-02-14 – 2014-02-17 (×7): 5000 [IU] via SUBCUTANEOUS
  Filled 2014-02-14 (×11): qty 1

## 2014-02-14 MED ORDER — SODIUM CHLORIDE 0.9 % IJ SOLN
3.0000 mL | Freq: Two times a day (BID) | INTRAMUSCULAR | Status: DC
  Administered 2014-02-17: 3 mL via INTRAVENOUS

## 2014-02-14 MED ORDER — SODIUM CHLORIDE 0.9 % IV SOLN
2000.0000 mg | Freq: Once | INTRAVENOUS | Status: AC
  Administered 2014-02-14: 2000 mg via INTRAVENOUS
  Filled 2014-02-14: qty 2000

## 2014-02-14 MED ORDER — CLINDAMYCIN PHOSPHATE 600 MG/50ML IV SOLN
600.0000 mg | Freq: Three times a day (TID) | INTRAVENOUS | Status: DC
  Administered 2014-02-15: 600 mg via INTRAVENOUS
  Filled 2014-02-14 (×3): qty 50

## 2014-02-14 MED ORDER — ALBUTEROL SULFATE HFA 108 (90 BASE) MCG/ACT IN AERS: 2.0000 | INHALATION_SPRAY | Freq: Four times a day (QID) | RESPIRATORY_TRACT | Status: DC | PRN

## 2014-02-14 MED ORDER — PROCHLORPERAZINE MALEATE 5 MG PO TABS
5.0000 mg | ORAL_TABLET | Freq: Every day | ORAL | Status: DC | PRN
  Filled 2014-02-14: qty 1

## 2014-02-14 MED ORDER — HYDRALAZINE HCL 20 MG/ML IJ SOLN: 5.0000 mg | INTRAMUSCULAR | Status: DC | PRN

## 2014-02-14 MED ORDER — FUROSEMIDE 20 MG PO TABS: 80.0000 mg | ORAL_TABLET | Freq: Two times a day (BID) | ORAL | Status: DC

## 2014-02-14 MED ORDER — ALLOPURINOL 300 MG PO TABS
300.0000 mg | ORAL_TABLET | Freq: Every day | ORAL | Status: DC
  Administered 2014-02-14 – 2014-02-16 (×3): 300 mg via ORAL
  Filled 2014-02-14 (×4): qty 1

## 2014-02-14 MED ORDER — CALCIUM ACETATE 667 MG PO CAPS
667.0000 mg | ORAL_CAPSULE | Freq: Three times a day (TID) | ORAL | Status: DC
  Administered 2014-02-15 (×2): 667 mg via ORAL
  Filled 2014-02-14 (×4): qty 1

## 2014-02-14 MED ORDER — SODIUM CHLORIDE 0.9 % IJ SOLN
3.0000 mL | INTRAMUSCULAR | Status: DC | PRN
Start: 2014-02-14 — End: 2014-02-16

## 2014-02-14 MED ORDER — LISINOPRIL 20 MG PO TABS
20.0000 mg | ORAL_TABLET | Freq: Every day | ORAL | Status: DC
  Administered 2014-02-14 – 2014-02-17 (×4): 20 mg via ORAL
  Filled 2014-02-14 (×4): qty 1

## 2014-02-14 MED ORDER — METHOCARBAMOL 500 MG PO TABS
500.0000 mg | ORAL_TABLET | Freq: Two times a day (BID) | ORAL | Status: DC
  Administered 2014-02-14 – 2014-02-17 (×6): 500 mg via ORAL
  Filled 2014-02-14 (×7): qty 1

## 2014-02-14 MED ORDER — ALPRAZOLAM 0.5 MG PO TABS
0.5000 mg | ORAL_TABLET | Freq: Three times a day (TID) | ORAL | Status: DC
  Administered 2014-02-14 – 2014-02-17 (×7): 0.5 mg via ORAL
  Filled 2014-02-14 (×7): qty 1

## 2014-02-14 MED ORDER — BIOTENE DRY MOUTH MT LIQD
15.0000 mL | Freq: Two times a day (BID) | OROMUCOSAL | Status: DC
  Administered 2014-02-15 – 2014-02-17 (×5): 15 mL via OROMUCOSAL

## 2014-02-14 MED ORDER — SODIUM CHLORIDE 0.9 % IJ SOLN
3.0000 mL | Freq: Two times a day (BID) | INTRAMUSCULAR | Status: DC
  Administered 2014-02-15: 3 mL via INTRAVENOUS

## 2014-02-14 MED ORDER — OXYCODONE-ACETAMINOPHEN 5-325 MG PO TABS
1.0000 | ORAL_TABLET | Freq: Once | ORAL | Status: AC
  Administered 2014-02-14: 1 via ORAL
  Filled 2014-02-14: qty 1

## 2014-02-14 MED ORDER — RENA-VITE PO TABS
1.0000 | ORAL_TABLET | Freq: Every day | ORAL | Status: DC
  Administered 2014-02-14 – 2014-02-16 (×3): 1 via ORAL
  Filled 2014-02-14 (×4): qty 1

## 2014-02-14 MED ORDER — CYCLOBENZAPRINE HCL 10 MG PO TABS
10.0000 mg | ORAL_TABLET | Freq: Two times a day (BID) | ORAL | Status: DC | PRN
  Administered 2014-02-15 – 2014-02-16 (×2): 10 mg via ORAL
  Filled 2014-02-14 (×2): qty 1

## 2014-02-14 NOTE — H&P (Addendum)
Hospitalist Admission History and Physical  Patient name: [name] Medical record number: [id number] Date of birth: [birth year] Age: 43 y.o. Gender: female  Primary Care Provider: Enid Skeens., MD  Chief Complaint: cellulitis   History of Present Illness:This is a 43 y.o. year old female with multiple medical problems including morbid obesity, chronic pain, end-stage renal disease on hemodialysis Tuesday Thursday Saturday, tobacco abuse presenting with left lower back cellulitis. Patient states she has noticed progressive left low back pain redness and swelling over the past 2-3 days. Has also had some worsening fevers and chills at home. Patient denies any known trauma laceration or abrasion to the area. It is unclear if she may have had a spider bite over the affected area. No prior history of diabetes. Patient presented to the ER today because of worsening symptoms. Was due for dialysis today but was unable to go because of worsening pain. In the ER, MAXIMUM TEMPERATURE of 99.6. Elevated blood pressures ranging in the 160s to 170s over 80s to 100s. White blood cell count 26.5. Creatinine 5.6. Affected area was reportedly ultrasound it at the bedside the ER physician with no formal pockets of abscess. Was initially started on vancomycin but had a rash/allergic reaction to medication. Patient was given Benadryl and to transition to IV clindamycin.  Patient Active Problem List   Diagnosis Date Noted  . Cellulitis 02/14/2014  . Thinning of skin 04/09/2013  . Mechanical complication of other vascular device, implant, and graft 04/09/2013  . Pain, limb, left 10/18/2012  . Swollen 10/18/2012  . Status post endometrial ablation 12/10/2011  . End stage renal disease 11/24/2011  . Renal failure (ARF), acute on chronic 10/19/2011  . HTN (hypertension) 10/19/2011  . Anemia 10/19/2011   Past Medical History: Past Medical History  Diagnosis Date  . Anemia   . Depression   . Reflux    . Dizziness   . Anxiety   . ADD (attention deficit disorder)   . Asthma   . Headache(784.0)   . Gout   . Anemia   . GERD (gastroesophageal reflux disease)   . Obesity   . Gastritis   . Blood transfusion 06/2011    6 units transfused at [facility]  . Arthritis     knee, wrist, back   . Bronchitis   . Shortness of breath   . Sleep apnea     does not use CPAP  . Neuromuscular disorder     carpal tunnel  . Hypertension     sees Dr. Wendie Agreste  . Chronic kidney disease     Atrophic left kidney, sees Dr. Hassell Done  . Renal insufficiency     Past Surgical History: Past Surgical History  Procedure Laterality Date  . Cesarean section    . Spine surgery  2004    Lumbar diskectomy   . Microdiskectomy  04/2001    Herniated disk L5-S1  . Renal biopsy  04/2004  . Wisdom tooth extraction    . Carpel tunnel release      x 2; bilateral  . Upper gastrointestinal endoscopy    . Colonscopy    . Dilation and curettage of uterus    . Av fistula surgery  11/30/11    [facility] - redo fistula  . Hysteroscopy with thermachoice  12/10/2011    Procedure: HYSTEROSCOPY WITH THERMACHOICE;  Surgeon: Betsy Coder, MD;  Location: [facility];  Service: Gynecology;  Laterality: N/A;  . Shuntogram  Dec. 18, 2013    Left arm  .  Av fistula placement  06/10/11    Left brachiocephalic AVF    Social History: History   Social History  . Marital Status: Divorced    Spouse Name: N/A    Number of Children: N/A  . Years of Education: N/A   Social History Main Topics  . Smoking status: Current Every Day Smoker -- 0.50 packs/day for 10 years    Types: Cigarettes  . Smokeless tobacco: Never Used  . Alcohol Use: Yes     Comment: ocassional  . Drug Use: No  . Sexual Activity: Not Currently    Birth Control/ Protection: Condom   Other Topics Concern  . None   Social History Narrative  . None    Family History: Family History  Problem Relation Age of Onset  . Hypertension Mother   . Heart disease  Father     Heart Disease before age 64  . Hypertension Father   . Heart attack Father   . Hypertension Daughter     Allergies: Allergies  Allergen Reactions  . Nsaids Other (See Comments)    Renal failure  . Clarithromycin Nausea And Vomiting  . Demerol Nausea And Vomiting and Other (See Comments)    Severe headaches  . Dilaudid [Hydromorphone Hcl] Nausea And Vomiting and Other (See Comments)    Headache   . Fentanyl Nausea And Vomiting  . Morphine And Related Nausea And Vomiting    Severe headache  . Other Other (See Comments)    Seeds and nuts- has diverticulitis   . Vancomycin Hives    Current Facility-Administered Medications  Medication Dose Route Frequency Provider Last Rate Last Dose  . 0.9 %  sodium chloride infusion  250 mL Intravenous PRN Shanda Howells, MD      . acetaminophen (TYLENOL) tablet 1,000 mg  1,000 mg Oral Daily PRN Shanda Howells, MD      . albuterol (PROVENTIL HFA;VENTOLIN HFA) 108 (90 BASE) MCG/ACT inhaler 2 puff  2 puff Inhalation Q6H PRN Shanda Howells, MD      . allopurinol (ZYLOPRIM) tablet 300 mg  300 mg Oral QHS Shanda Howells, MD      . ALPRAZolam Duanne Moron) tablet 0.5 mg  0.5 mg Oral TID Shanda Howells, MD      . amLODipine (NORVASC) tablet 10 mg  10 mg Oral Daily Shanda Howells, MD      . budesonide-formoterol [facility]) 160-4.5 MCG/ACT inhaler 2 puff  2 puff Inhalation BID Shanda Howells, MD      . Derrill Memo ON 02/15/2014] calcium acetate (PHOSLO) capsule 667 mg  667 mg Oral TID WC Shanda Howells, MD      . clindamycin (CLEOCIN) IVPB 600 mg  600 mg Intravenous 3 times per day Shanda Howells, MD      . cyclobenzaprine (FLEXERIL) tablet 10 mg  10 mg Oral BID PRN Shanda Howells, MD      . furosemide (LASIX) tablet 80 mg  80 mg Oral BID Shanda Howells, MD      . heparin injection 5,000 Units  5,000 Units Subcutaneous 3 times per day Shanda Howells, MD      . HYDROcodone-acetaminophen (NORCO/VICODIN) 5-325 MG per tablet 1 tablet  1 tablet Oral Q6H PRN Shanda Howells, MD   1 tablet at 02/14/14 1935  . lisinopril (PRINIVIL,ZESTRIL) tablet 20 mg  20 mg Oral Daily Shanda Howells, MD      . methocarbamol (ROBAXIN) tablet 500 mg  500 mg Oral BID Shanda Howells, MD      . montelukast [facility])  tablet 10 mg  10 mg Oral Daily Shanda Howells, MD      . morphine (MS CONTIN) 12 hr tablet 60 mg  60 mg Oral Q12H Shanda Howells, MD      . multivitamin (RENA-VIT) tablet 1 tablet  1 tablet Oral QHS Shanda Howells, MD      . Derrill Memo ON 02/15/2014] pantoprazole (PROTONIX) EC tablet 80 mg  80 mg Oral Q1200 Shanda Howells, MD      . prochlorperazine (COMPAZINE) tablet 5 mg  5 mg Oral Daily PRN Shanda Howells, MD      . promethazine (PHENERGAN) tablet 25 mg  25 mg Oral Q6H PRN Shanda Howells, MD      . sodium chloride 0.9 % injection 3 mL  3 mL Intravenous Q12H Shanda Howells, MD      . sodium chloride 0.9 % injection 3 mL  3 mL Intravenous Q12H Shanda Howells, MD      . sodium chloride 0.9 % injection 3 mL  3 mL Intravenous PRN Shanda Howells, MD      . Derrill Memo ON 02/15/2014] SUMAtriptan (IMITREX) tablet 50 mg  50 mg Oral Daily PRN Shanda Howells, MD       Current Outpatient Prescriptions  Medication Sig Dispense Refill  . acetaminophen (TYLENOL) 500 MG tablet Take 1,000 mg by mouth daily as needed. For headache      . albuterol (PROVENTIL HFA;VENTOLIN HFA) 108 (90 BASE) MCG/ACT inhaler Inhale 2 puffs into the lungs every 6 (six) hours as needed. For shortness of breath       . allopurinol (ZYLOPRIM) 300 MG tablet Take 300 mg by mouth at bedtime.       . ALPRAZolam (XANAX) 0.5 MG tablet Take 0.5 mg by mouth 3 (three) times daily.       Marland Kitchen amLODipine (NORVASC) 10 MG tablet Take 10 mg by mouth daily.      . budesonide-formoterol (SYMBICORT) 160-4.5 MCG/ACT inhaler Inhale 2 puffs into the lungs 2 (two) times daily.      . calcium acetate (PHOSLO) 667 MG capsule Take 667 mg by mouth 3 (three) times daily with meals.      . cyclobenzaprine (FLEXERIL) 10 MG tablet Take 10 mg by mouth 2  (two) times daily as needed for muscle spasms. For muscle spasms      . esomeprazole (NEXIUM) 40 MG capsule Take 40 mg by mouth daily.       . furosemide (LASIX) 80 MG tablet Take 80 mg by mouth 2 (two) times daily.       Marland Kitchen HYDROcodone-acetaminophen (NORCO/VICODIN) 5-325 MG per tablet Take 1 tablet by mouth every 6 (six) hours as needed for pain.      Marland Kitchen lidocaine-prilocaine (EMLA) cream Apply 1 application topically 3 (three) times a week. For dialysis port.      Marland Kitchen lisinopril (PRINIVIL,ZESTRIL) 20 MG tablet Take 20 mg by mouth daily.       . Melatonin 5 MG TABS Take 5 mg by mouth daily.      . methocarbamol (ROBAXIN) 500 MG tablet Take 500 mg by mouth 2 (two) times daily.       . montelukast (SINGULAIR) 10 MG tablet Take 10 mg by mouth every morning.       . multivitamin (RENA-VIT) TABS tablet Take 1 tablet by mouth daily.      Marland Kitchen oxymorphone (OPANA ER) 20 MG 12 hr tablet Take 20 mg by mouth every 12 (twelve) hours.      . prochlorperazine (COMPAZINE)  5 MG tablet Take 5 mg by mouth daily as needed.      . promethazine (PHENERGAN) 25 MG tablet Take 25 mg by mouth every 6 (six) hours as needed. For nausea      . SUMAtriptan (IMITREX) 50 MG tablet Take 50 mg by mouth daily as needed. For migraines       Review Of Systems: 12 point ROS negative except as noted above in HPI.  Physical Exam: Filed Vitals:   02/14/14 1930  BP: 170/107  Pulse: 117  Temp:   Resp:     General: alert, cooperative and morbidly obese HEENT: PERRLA and extra ocular movement intact Heart: S1, S2 normal, no murmur, rub or gallop, regular rate and rhythm Lungs: No respiratory distress, able to speak in full sentences, faint wheezes in the bases bilaterally Abdomen: Obese abdomen, positive bowel sounds, left flank area with diffuse erythema, tenderness, induration Extremities: extremities normal, atraumatic, no cyanosis or edema Skin: As above Neurology: normal without focal findings  Labs and Imaging: Lab Results   Component Value Date/Time   NA 139 02/14/2014  3:23 PM   K 3.1* 02/14/2014  3:23 PM   CL 93* 02/14/2014  3:23 PM   CO2 25 02/14/2014  3:23 PM   BUN 34* 02/14/2014  3:23 PM   CREATININE 5.84* 02/14/2014  7:25 PM   GLUCOSE 147* 02/14/2014  3:23 PM   Lab Results  Component Value Date   WBC 27.8* 02/14/2014   HGB 10.4* 02/14/2014   HCT 32.4* 02/14/2014   MCV 101.9* 02/14/2014   PLT 222 02/14/2014    No results found.   Assessment and Plan: DEVONYA NEES is a 43 y.o. year old female presenting with cellulitis   Cellulitis: We'll continue IV clindamycin. Given the degree of induration on exam as well as baseline secondary immunocompromise state in the setting of ESRD and smoking, will consult surgery to assess whether patient may benefit from incision and drainage of infected area (? Abscess) versus imaging (MRI)  ESRD: Due for dialysis today. Clinically euvolemic on exam. Consult renal in a.m. Continue renal meds. Noted mild hypokalemia. Hold Lasix. Low dose K dur x1. Recheck potassium after midnight. Mag level.   Leukocytosis: Likely secondary to cellulitis. Will continue to trend.  Anemia: Anemia of renal disease. Hemoglobin at baseline. Microcytic predominance of MCV. Check anemia panel as well as TSH.  Hypertension: Elevated blood pressure today pending hemodialysis. Continue outpatient regimen. When necessary hydralazine.   FEN/GI: Renal diet. PPI Prophylaxis: Subcutaneous heparin Disposition: Pending further evaluation Code Status: Full code       Shanda Howells MD  Pager: [phone]

## 2014-02-14 NOTE — ED Notes (Signed)
Pt has abscess to left lower back. Redness and hardness around area. No drainage. Pt is dialysis patient, did not go today but to pain. Pt is tearful.

## 2014-02-14 NOTE — ED Notes (Addendum)
Pt with local reaction to vanc - redness, burning, itching, hives above catheter site.  Vanc stopped immediately and PA notified.

## 2014-02-14 NOTE — Progress Notes (Signed)
PHARMACIST - PHYSICIAN ORDER COMMUNICATION  CONCERNING: P&T Medication Policy on Herbal Medications  DESCRIPTION:  This patient's order for:  Melatonin  has been noted.  This product(s) is classified as an "herbal" or natural product. Due to a lack of definitive safety studies or FDA approval, nonstandard manufacturing practices, plus the potential risk of unknown drug-drug interactions while on inpatient medications, the Pharmacy and Therapeutics Committee does not permit the use of "herbal" or natural products of this type within [facility].   ACTION TAKEN: The pharmacy department is unable to verify this order at this time and your patient has been informed of this safety policy. Please reevaluate patient's clinical condition at discharge and address if the herbal or natural product(s) should be resumed at that time.  Sloan Leiter, PharmD, BCPS Clinical Pharmacist [phone] 02/14/2014, 7:27 PM

## 2014-02-14 NOTE — ED Notes (Signed)
MD at bedside. 

## 2014-02-14 NOTE — ED Provider Notes (Signed)
CSN: [id number]     Arrival date & time 02/14/14  1510 History   First MD Initiated Contact with Patient 02/14/14 1659     Chief Complaint  Patient presents with  . Abscess     (Consider location/radiation/quality/duration/timing/severity/associated sxs/prior Treatment) HPI Patient presents to the emergency department with pain and redness to her lower back.  The patient, states, that she started having increasing pain and swelling to the lower back and flank area on the left 4 days, ago.  Patient thought she may have been bitten by a spider.  Patient, states, that she did not take any medications prior to arrival.  Patient, states, that nothing seems make her condition, better and palpation makes the pain, worse.  Patient, states, that she's not had any nausea, vomiting, diarrhea, weakness, dizziness chest pain, shortness of breath dizziness, rash, syncope, cough, or headache.  The patient, states, that her boyfriend attempted to squeeze, one of the pimple like areas with no relief of his swelling Past Medical History  Diagnosis Date  . Anemia   . Depression   . Reflux   . Dizziness   . Anxiety   . ADD (attention deficit disorder)   . Asthma   . Headache(784.0)   . Gout   . Anemia   . GERD (gastroesophageal reflux disease)   . Obesity   . Gastritis   . Blood transfusion 06/2011    6 units transfused at [facility]  . Arthritis     knee, wrist, back   . Bronchitis   . Shortness of breath   . Sleep apnea     does not use CPAP  . Neuromuscular disorder     carpal tunnel  . Hypertension     sees Dr. Wendie Agreste  . Chronic kidney disease     Atrophic left kidney, sees Dr. Hassell Done  . Renal insufficiency    Past Surgical History  Procedure Laterality Date  . Cesarean section    . Spine surgery  2004    Lumbar diskectomy   . Microdiskectomy  04/2001    Herniated disk L5-S1  . Renal biopsy  04/2004  . Wisdom tooth extraction    . Carpel tunnel release      x 2; bilateral  . Upper  gastrointestinal endoscopy    . Colonscopy    . Dilation and curettage of uterus    . Av fistula surgery  11/30/11    [facility] - redo fistula  . Hysteroscopy with thermachoice  12/10/2011    Procedure: HYSTEROSCOPY WITH THERMACHOICE;  Surgeon: Betsy Coder, MD;  Location: [facility];  Service: Gynecology;  Laterality: N/A;  . Shuntogram  Dec. 18, 2013    Left arm  . Av fistula placement  06/10/11    Left brachiocephalic AVF   Family History  Problem Relation Age of Onset  . Hypertension Mother   . Heart disease Father     Heart Disease before age 35  . Hypertension Father   . Heart attack Father   . Hypertension Daughter    History  Substance Use Topics  . Smoking status: Current Every Day Smoker -- 0.50 packs/day for 10 years    Types: Cigarettes  . Smokeless tobacco: Never Used  . Alcohol Use: Yes     Comment: ocassional   OB History   Grav Para Term Preterm Abortions TAB SAB Ect Mult Living                 Review of  Systems  All other systems negative except as documented in the HPI. All pertinent positives and negatives as reviewed in the HPI.  Allergies  Nsaids; Clarithromycin; Demerol; Dilaudid; Fentanyl; Morphine and related; and Other  Home Medications   Prior to Admission medications   Medication Sig Start Date End Date Taking? Authorizing Provider  acetaminophen (TYLENOL) 500 MG tablet Take 1,000 mg by mouth daily as needed. For headache   Yes Historical Provider, MD  albuterol (PROVENTIL HFA;VENTOLIN HFA) 108 (90 BASE) MCG/ACT inhaler Inhale 2 puffs into the lungs every 6 (six) hours as needed. For shortness of breath    Yes Historical Provider, MD  allopurinol (ZYLOPRIM) 300 MG tablet Take 300 mg by mouth at bedtime.    Yes Historical Provider, MD  ALPRAZolam Duanne Moron) 0.5 MG tablet Take 0.5 mg by mouth 3 (three) times daily.    Yes Historical Provider, MD  amLODipine (NORVASC) 10 MG tablet Take 10 mg by mouth daily. 02/08/14  Yes Historical Provider, MD   budesonide-formoterol (SYMBICORT) 160-4.5 MCG/ACT inhaler Inhale 2 puffs into the lungs 2 (two) times daily.   Yes Historical Provider, MD  calcium acetate (PHOSLO) 667 MG capsule Take 667 mg by mouth 3 (three) times daily with meals.   Yes Historical Provider, MD  cyclobenzaprine (FLEXERIL) 10 MG tablet Take 10 mg by mouth 2 (two) times daily as needed for muscle spasms. For muscle spasms   Yes Historical Provider, MD  esomeprazole (NEXIUM) 40 MG capsule Take 40 mg by mouth daily.    Yes Historical Provider, MD  furosemide (LASIX) 80 MG tablet Take 80 mg by mouth 2 (two) times daily.    Yes Historical Provider, MD  HYDROcodone-acetaminophen (NORCO/VICODIN) 5-325 MG per tablet Take 1 tablet by mouth every 6 (six) hours as needed for pain.   Yes Historical Provider, MD  lidocaine-prilocaine (EMLA) cream Apply 1 application topically 3 (three) times a week. For dialysis port.   Yes Historical Provider, MD  lisinopril (PRINIVIL,ZESTRIL) 20 MG tablet Take 20 mg by mouth daily.  04/06/13  Yes Historical Provider, MD  Melatonin 5 MG TABS Take 5 mg by mouth daily.   Yes Historical Provider, MD  methocarbamol (ROBAXIN) 500 MG tablet Take 500 mg by mouth 2 (two) times daily.  03/22/13  Yes Historical Provider, MD  montelukast (SINGULAIR) 10 MG tablet Take 10 mg by mouth every morning.    Yes Historical Provider, MD  multivitamin (RENA-VIT) TABS tablet Take 1 tablet by mouth daily.   Yes Historical Provider, MD  oxymorphone (OPANA ER) 20 MG 12 hr tablet Take 20 mg by mouth every 12 (twelve) hours.   Yes Historical Provider, MD  prochlorperazine (COMPAZINE) 5 MG tablet Take 5 mg by mouth daily as needed. 01/08/14  Yes Historical Provider, MD  promethazine (PHENERGAN) 25 MG tablet Take 25 mg by mouth every 6 (six) hours as needed. For nausea   Yes Historical Provider, MD  SUMAtriptan (IMITREX) 50 MG tablet Take 50 mg by mouth daily as needed. For migraines 02/14/14  Yes Historical Provider, MD   BP 169/108  Pulse  114  Temp(Src) 99.6 F (37.6 C)  Resp 20  Ht 5\' 4"  (1.626 m)  Wt 235 lb (106.595 kg)  BMI 40.32 kg/m2  SpO2 96%  LMP 01/31/2014 Physical Exam  Nursing note and vitals reviewed. Constitutional: She is oriented to person, place, and time. She appears well-developed and well-nourished. No distress.  HENT:  Head: Normocephalic and atraumatic.  Mouth/Throat: Oropharynx is clear and moist.  Eyes:  Pupils are equal, round, and reactive to light.  Neck: Normal range of motion. Neck supple.  Cardiovascular: Normal rate, regular rhythm and normal heart sounds.  Exam reveals no gallop and no friction rub.   No murmur heard. Pulmonary/Chest: Effort normal and breath sounds normal. No respiratory distress.  Neurological: She is alert and oriented to person, place, and time. She exhibits normal muscle tone. Coordination normal.  Skin: Skin is warm and dry.  The patient has a area of erythema, and induration. There is no fluctuance.    ED Course  Procedures (including critical care time) Labs Review Labs Reviewed  CBC WITH DIFFERENTIAL - Abnormal; Notable for the following:    WBC 26.5 (*)    RBC 3.28 (*)    Hemoglobin 10.9 (*)    HCT 33.7 (*)    MCV 102.7 (*)    RDW 15.8 (*)    Neutrophils Relative % 87 (*)    Lymphocytes Relative 9 (*)    Neutro Abs 23.0 (*)    Monocytes Absolute 1.1 (*)    All other components within normal limits  BASIC METABOLIC PANEL - Abnormal; Notable for the following:    Potassium 3.1 (*)    Chloride 93 (*)    Glucose, Bld 147 (*)    BUN 34 (*)    Creatinine, Ser 5.86 (*)    GFR calc non Af Amer 8 (*)    GFR calc Af Amer 9 (*)    All other components within normal limits     The patient will be admitted for further care and evaluation.  Brent General, PA-C 02/15/14 [phone]

## 2014-02-15 ENCOUNTER — Observation Stay (HOSPITAL_COMMUNITY): Payer: Medicare Other

## 2014-02-15 ENCOUNTER — Encounter (HOSPITAL_COMMUNITY): Payer: Self-pay | Admitting: General Surgery

## 2014-02-15 ENCOUNTER — Encounter (HOSPITAL_COMMUNITY)
Admission: EM | Disposition: A | Discharge status: Home Health | Payer: Self-pay | Source: Home / Self Care | Attending: Internal Medicine

## 2014-02-15 ENCOUNTER — Inpatient Hospital Stay (HOSPITAL_COMMUNITY): Payer: Medicare Other | Admitting: Certified Registered"

## 2014-02-15 ENCOUNTER — Encounter (HOSPITAL_COMMUNITY): Payer: Medicare Other | Admitting: Certified Registered"

## 2014-02-15 ENCOUNTER — Other Ambulatory Visit: Payer: Self-pay

## 2014-02-15 DIAGNOSIS — E8779 Other fluid overload: Secondary | ICD-10-CM | POA: Diagnosis not present

## 2014-02-15 DIAGNOSIS — A419 Sepsis, unspecified organism: Secondary | ICD-10-CM | POA: Diagnosis not present

## 2014-02-15 DIAGNOSIS — K219 Gastro-esophageal reflux disease without esophagitis: Secondary | ICD-10-CM | POA: Diagnosis not present

## 2014-02-15 DIAGNOSIS — I10 Essential (primary) hypertension: Secondary | ICD-10-CM

## 2014-02-15 DIAGNOSIS — N186 End stage renal disease: Secondary | ICD-10-CM

## 2014-02-15 DIAGNOSIS — I120 Hypertensive chronic kidney disease with stage 5 chronic kidney disease or end stage renal disease: Secondary | ICD-10-CM | POA: Diagnosis not present

## 2014-02-15 DIAGNOSIS — N2581 Secondary hyperparathyroidism of renal origin: Secondary | ICD-10-CM | POA: Diagnosis not present

## 2014-02-15 DIAGNOSIS — N189 Chronic kidney disease, unspecified: Secondary | ICD-10-CM | POA: Diagnosis not present

## 2014-02-15 DIAGNOSIS — L0291 Cutaneous abscess, unspecified: Secondary | ICD-10-CM | POA: Diagnosis not present

## 2014-02-15 DIAGNOSIS — Z01818 Encounter for other preprocedural examination: Secondary | ICD-10-CM | POA: Diagnosis not present

## 2014-02-15 DIAGNOSIS — D649 Anemia, unspecified: Secondary | ICD-10-CM

## 2014-02-15 DIAGNOSIS — L03319 Cellulitis of trunk, unspecified: Secondary | ICD-10-CM

## 2014-02-15 DIAGNOSIS — L02219 Cutaneous abscess of trunk, unspecified: Secondary | ICD-10-CM

## 2014-02-15 HISTORY — PX: IRRIGATION AND DEBRIDEMENT ABSCESS: SHX5252

## 2014-02-15 LAB — COMPREHENSIVE METABOLIC PANEL
ALT: 11 U/L (ref 0–35)
AST: 9 U/L (ref 0–37)
Albumin: 2.5 g/dL — ABNORMAL LOW (ref 3.5–5.2)
Alkaline Phosphatase: 72 U/L (ref 39–117)
BUN: 38 mg/dL — AB (ref 6–23)
CHLORIDE: 97 meq/L (ref 96–112)
CO2: 24 meq/L (ref 19–32)
CREATININE: 6.15 mg/dL — AB (ref 0.50–1.10)
Calcium: 9.7 mg/dL (ref 8.4–10.5)
GFR calc non Af Amer: 8 mL/min — ABNORMAL LOW (ref >90)
GFR, EST AFRICAN AMERICAN: 9 mL/min — AB (ref >90)
GLUCOSE: 115 mg/dL — AB (ref 70–99)
Potassium: 3 mEq/L — ABNORMAL LOW (ref 3.7–5.3)
Sodium: 140 mEq/L (ref 137–147)
Total Protein: 6.2 g/dL (ref 6.0–8.3)

## 2014-02-15 LAB — CBC WITH DIFFERENTIAL/PLATELET
Basophils Absolute: 0 10*3/uL (ref 0.0–0.1)
Basophils Relative: 0 % (ref 0–1)
EOS ABS: 0 10*3/uL (ref 0.0–0.7)
Eosinophils Relative: 0 % (ref 0–5)
HCT: 31.9 % — ABNORMAL LOW (ref 36.0–46.0)
Hemoglobin: 10.2 g/dL — ABNORMAL LOW (ref 12.0–15.0)
Lymphocytes Relative: 11 % — ABNORMAL LOW (ref 12–46)
Lymphs Abs: 2.6 10*3/uL (ref 0.7–4.0)
MCH: 32.7 pg (ref 26.0–34.0)
MCHC: 32 g/dL (ref 30.0–36.0)
MCV: 102.2 fL — ABNORMAL HIGH (ref 78.0–100.0)
Monocytes Absolute: 1.4 10*3/uL — ABNORMAL HIGH (ref 0.1–1.0)
Monocytes Relative: 6 % (ref 3–12)
Neutro Abs: 19.9 10*3/uL — ABNORMAL HIGH (ref 1.7–7.7)
Neutrophils Relative %: 83 % — ABNORMAL HIGH (ref 43–77)
PLATELETS: 225 10*3/uL (ref 150–400)
RBC: 3.12 MIL/uL — ABNORMAL LOW (ref 3.87–5.11)
RDW: 15.8 % — ABNORMAL HIGH (ref 11.5–15.5)
WBC: 24 10*3/uL — AB (ref 4.0–10.5)

## 2014-02-15 LAB — FERRITIN: FERRITIN: 1167 ng/mL — AB (ref 10–291)

## 2014-02-15 LAB — FOLATE: Folate: >20 ng/mL

## 2014-02-15 LAB — IRON AND TIBC
IRON: 14 ug/dL — AB (ref 42–135)
Saturation Ratios: 9 % — ABNORMAL LOW (ref 20–55)
TIBC: 149 ug/dL — ABNORMAL LOW (ref 250–470)
UIBC: 135 ug/dL (ref 125–400)

## 2014-02-15 LAB — VITAMIN B12: Vitamin B-12: 763 pg/mL (ref 211–911)

## 2014-02-15 LAB — TSH: TSH: 0.536 u[IU]/mL (ref 0.350–4.500)

## 2014-02-15 LAB — HEMOGLOBIN A1C
Hgb A1c MFr Bld: 4.8 % (ref <5.7)
MEAN PLASMA GLUCOSE: 91 mg/dL (ref <117)

## 2014-02-15 SURGERY — IRRIGATION AND DEBRIDEMENT ABSCESS
Anesthesia: General | Site: Back | Laterality: Left

## 2014-02-15 MED ORDER — FENTANYL CITRATE 0.05 MG/ML IJ SOLN
INTRAMUSCULAR | Status: DC | PRN
  Administered 2014-02-15 (×2): 50 ug via INTRAVENOUS
  Administered 2014-02-15: 100 ug via INTRAVENOUS
  Administered 2014-02-15: 50 ug via INTRAVENOUS

## 2014-02-15 MED ORDER — CLINDAMYCIN PHOSPHATE 600 MG/50ML IV SOLN
600.0000 mg | Freq: Three times a day (TID) | INTRAVENOUS | Status: DC
  Administered 2014-02-15 – 2014-02-17 (×5): 600 mg via INTRAVENOUS
  Filled 2014-02-15 (×9): qty 50

## 2014-02-15 MED ORDER — LIDOCAINE-PRILOCAINE 2.5-2.5 % EX CREA: 1.0000 "application " | TOPICAL_CREAM | CUTANEOUS | Status: DC

## 2014-02-15 MED ORDER — HYDROMORPHONE HCL PF 1 MG/ML IJ SOLN
0.5000 mg | INTRAMUSCULAR | Status: AC | PRN
  Administered 2014-02-15 (×4): 0.5 mg via INTRAVENOUS

## 2014-02-15 MED ORDER — FENTANYL CITRATE 0.05 MG/ML IJ SOLN
INTRAMUSCULAR | Status: AC
  Filled 2014-02-15: qty 2

## 2014-02-15 MED ORDER — 0.9 % SODIUM CHLORIDE (POUR BTL) OPTIME
TOPICAL | Status: DC | PRN
  Administered 2014-02-15: 1000 mL

## 2014-02-15 MED ORDER — SUMATRIPTAN SUCCINATE 100 MG PO TABS
100.0000 mg | ORAL_TABLET | Freq: Every day | ORAL | Status: DC | PRN
  Administered 2014-02-15: 100 mg via ORAL
  Filled 2014-02-15 (×2): qty 1

## 2014-02-15 MED ORDER — MIDAZOLAM HCL 2 MG/2ML IJ SOLN
INTRAMUSCULAR | Status: AC
  Filled 2014-02-15: qty 2

## 2014-02-15 MED ORDER — HYDROMORPHONE HCL PF 1 MG/ML IJ SOLN
INTRAMUSCULAR | Status: AC
  Administered 2014-02-15: 0.5 mg via INTRAVENOUS
  Filled 2014-02-15: qty 1

## 2014-02-15 MED ORDER — ONDANSETRON HCL 4 MG/2ML IJ SOLN
4.0000 mg | INTRAMUSCULAR | Status: DC | PRN
  Administered 2014-02-16 – 2014-02-17 (×2): 4 mg via INTRAVENOUS
  Filled 2014-02-15 (×2): qty 2

## 2014-02-15 MED ORDER — PROPOFOL 10 MG/ML IV BOLUS
INTRAVENOUS | Status: DC | PRN
  Administered 2014-02-15: 140 mg via INTRAVENOUS
  Administered 2014-02-15: 60 mg via INTRAVENOUS
  Administered 2014-02-15: 30 mg via INTRAVENOUS

## 2014-02-15 MED ORDER — DARBEPOETIN ALFA-POLYSORBATE 25 MCG/0.42ML IJ SOLN
25.0000 ug | INTRAMUSCULAR | Status: DC
  Filled 2014-02-15: qty 0.42

## 2014-02-15 MED ORDER — HYDROCODONE-ACETAMINOPHEN 5-325 MG PO TABS
ORAL_TABLET | ORAL | Status: AC
  Administered 2014-02-15: 1 via ORAL
  Filled 2014-02-15: qty 1

## 2014-02-15 MED ORDER — MIDAZOLAM HCL 5 MG/5ML IJ SOLN
INTRAMUSCULAR | Status: DC | PRN
  Administered 2014-02-15: 2 mg via INTRAVENOUS

## 2014-02-15 MED ORDER — OXYCODONE-ACETAMINOPHEN 5-325 MG PO TABS
1.0000 | ORAL_TABLET | Freq: Once | ORAL | Status: AC
  Administered 2014-02-15: 2 via ORAL
  Filled 2014-02-15: qty 2

## 2014-02-15 MED ORDER — FENTANYL CITRATE 0.05 MG/ML IJ SOLN
INTRAMUSCULAR | Status: AC
  Administered 2014-02-15: 50 ug via INTRAVENOUS
  Filled 2014-02-15: qty 2

## 2014-02-15 MED ORDER — PROPOFOL 10 MG/ML IV BOLUS
INTRAVENOUS | Status: AC
  Filled 2014-02-15: qty 20

## 2014-02-15 MED ORDER — ROCURONIUM BROMIDE 100 MG/10ML IV SOLN: INTRAVENOUS | Status: DC | PRN

## 2014-02-15 MED ORDER — OXYCODONE HCL ER 40 MG PO T12A
40.0000 mg | EXTENDED_RELEASE_TABLET | Freq: Two times a day (BID) | ORAL | Status: DC
  Administered 2014-02-15 – 2014-02-17 (×5): 40 mg via ORAL
  Filled 2014-02-15 (×5): qty 1

## 2014-02-15 MED ORDER — LIDOCAINE HCL (CARDIAC) 20 MG/ML IV SOLN
INTRAVENOUS | Status: DC | PRN
  Administered 2014-02-15: 80 mg via INTRAVENOUS

## 2014-02-15 MED ORDER — SUCCINYLCHOLINE CHLORIDE 20 MG/ML IJ SOLN
INTRAMUSCULAR | Status: DC | PRN
  Administered 2014-02-15: 30 mg via INTRAVENOUS
  Administered 2014-02-15: 100 mg via INTRAVENOUS

## 2014-02-15 MED ORDER — ARTIFICIAL TEARS OP OINT
TOPICAL_OINTMENT | OPHTHALMIC | Status: DC | PRN
Start: 2014-02-15 — End: 2014-02-15
  Administered 2014-02-15: 1 via OPHTHALMIC

## 2014-02-15 MED ORDER — SEVELAMER CARBONATE 2.4 G PO PACK
2.4000 g | PACK | Freq: Three times a day (TID) | ORAL | Status: DC
  Administered 2014-02-16 (×2): 2.4 g via ORAL
  Filled 2014-02-15 (×8): qty 1

## 2014-02-15 MED ORDER — POTASSIUM CHLORIDE CRYS ER 20 MEQ PO TBCR
20.0000 meq | EXTENDED_RELEASE_TABLET | Freq: Once | ORAL | Status: AC
  Administered 2014-02-15: 20 meq via ORAL
  Filled 2014-02-15: qty 1

## 2014-02-15 MED ORDER — FENTANYL CITRATE 0.05 MG/ML IJ SOLN
INTRAMUSCULAR | Status: AC
  Filled 2014-02-15: qty 5

## 2014-02-15 MED ORDER — PRO-STAT SUGAR FREE PO LIQD
30.0000 mL | Freq: Every day | ORAL | Status: DC
  Administered 2014-02-16: 30 mL via ORAL
  Filled 2014-02-15 (×3): qty 30

## 2014-02-15 MED ORDER — NA FERRIC GLUC CPLX IN SUCROSE 12.5 MG/ML IV SOLN
125.0000 mg | INTRAVENOUS | Status: DC
  Filled 2014-02-15: qty 10

## 2014-02-15 MED ORDER — FENTANYL CITRATE 0.05 MG/ML IJ SOLN
25.0000 ug | INTRAMUSCULAR | Status: DC | PRN
  Administered 2014-02-15 (×3): 50 ug via INTRAVENOUS

## 2014-02-15 MED ORDER — FENTANYL CITRATE 0.05 MG/ML IJ SOLN
50.0000 ug | INTRAMUSCULAR | Status: DC | PRN
  Administered 2014-02-16 (×4): 50 ug via INTRAVENOUS
  Administered 2014-02-16: 21:00:00 via INTRAVENOUS
  Administered 2014-02-16 – 2014-02-17 (×8): 50 ug via INTRAVENOUS
  Filled 2014-02-15 (×13): qty 2

## 2014-02-15 MED ORDER — NICOTINE 21 MG/24HR TD PT24
21.0000 mg | MEDICATED_PATCH | Freq: Every day | TRANSDERMAL | Status: DC
  Administered 2014-02-15 – 2014-02-17 (×3): 21 mg via TRANSDERMAL
  Filled 2014-02-15 (×3): qty 1

## 2014-02-15 MED ORDER — SODIUM CHLORIDE 0.9 % IV SOLN
INTRAVENOUS | Status: DC
  Administered 2014-02-15: 15:00:00 via INTRAVENOUS

## 2014-02-15 MED ORDER — ONDANSETRON HCL 4 MG/2ML IJ SOLN
INTRAMUSCULAR | Status: DC | PRN
  Administered 2014-02-15: 4 mg via INTRAVENOUS

## 2014-02-15 MED ORDER — ONDANSETRON HCL 4 MG/2ML IJ SOLN: 4.0000 mg | Freq: Four times a day (QID) | INTRAMUSCULAR | Status: DC | PRN

## 2014-02-15 SURGICAL SUPPLY — 33 items
BANDAGE GAUZE ELAST BULKY 4 IN (GAUZE/BANDAGES/DRESSINGS) ×3 IMPLANT
BLADE 10 SAFETY STRL DISP (BLADE) ×3 IMPLANT
BLADE SURG ROTATE 9660 (MISCELLANEOUS) IMPLANT
CANISTER SUCTION 2500CC (MISCELLANEOUS) ×3 IMPLANT
COVER SURGICAL LIGHT HANDLE (MISCELLANEOUS) ×3 IMPLANT
DRAPE LAPAROTOMY TRNSV 102X78 (DRAPE) ×3 IMPLANT
DRAPE UTILITY 15X26 W/TAPE STR (DRAPE) ×6 IMPLANT
ELECT CAUTERY BLADE 6.4 (BLADE) ×3 IMPLANT
ELECT REM PT RETURN 9FT ADLT (ELECTROSURGICAL) ×3
ELECTRODE REM PT RTRN 9FT ADLT (ELECTROSURGICAL) ×1 IMPLANT
GLOVE BIO SURGEON STRL SZ7 (GLOVE) ×3 IMPLANT
GLOVE BIOGEL PI IND STRL 7.5 (GLOVE) ×1 IMPLANT
GLOVE BIOGEL PI INDICATOR 7.5 (GLOVE) ×2
GOWN STRL REUS W/ TWL LRG LVL3 (GOWN DISPOSABLE) ×2 IMPLANT
GOWN STRL REUS W/TWL LRG LVL3 (GOWN DISPOSABLE) ×4
KIT BASIN OR (CUSTOM PROCEDURE TRAY) ×3 IMPLANT
KIT ROOM TURNOVER OR (KITS) ×3 IMPLANT
NS IRRIG 1000ML POUR BTL (IV SOLUTION) ×3 IMPLANT
PACK SURGICAL SETUP 50X90 (CUSTOM PROCEDURE TRAY) ×3 IMPLANT
PAD ARMBOARD 7.5X6 YLW CONV (MISCELLANEOUS) ×6 IMPLANT
PENCIL BUTTON HOLSTER BLD 10FT (ELECTRODE) ×3 IMPLANT
SPONGE GAUZE 4X4 12PLY (GAUZE/BANDAGES/DRESSINGS) ×3 IMPLANT
SPONGE LAP 18X18 X RAY DECT (DISPOSABLE) ×3 IMPLANT
SWAB COLLECTION DEVICE MRSA (MISCELLANEOUS) ×3 IMPLANT
SWAB CULTURE LIQUID MINI MALE (MISCELLANEOUS) ×3 IMPLANT
SYR BULB IRRIGATION 50ML (SYRINGE) ×3 IMPLANT
TOWEL OR 17X24 6PK STRL BLUE (TOWEL DISPOSABLE) ×3 IMPLANT
TOWEL OR 17X26 10 PK STRL BLUE (TOWEL DISPOSABLE) ×3 IMPLANT
TUBE ANAEROBIC SPECIMEN COL (MISCELLANEOUS) ×3 IMPLANT
TUBE CONNECTING 12'X1/4 (SUCTIONS) ×2
TUBE CONNECTING 12X1/4 (SUCTIONS) ×4 IMPLANT
WATER STERILE IRR 1000ML POUR (IV SOLUTION) ×3 IMPLANT
YANKAUER SUCT BULB TIP NO VENT (SUCTIONS) ×3 IMPLANT

## 2014-02-15 NOTE — Consult Note (Signed)
Left lower back cellulitis with spread - possible subcutaneous abscess  To OR today for incision and drainage.  Imogene Burn. Georgette Dover, MD, [facility] Surgery  General/ Trauma Surgery  02/15/2014 1:44 PM

## 2014-02-15 NOTE — Consult Note (Signed)
[facility] Renal Consultation Note    Indication for Consultation:  Management of ESRD/hemodialysis; anemia, hypertension/volume and secondary hyperparathyroidism  HPI: [name] is a 43 y.o. female ESRD patient (TTS HD) with multiple medical issues,  including chronic pain, who has been admitted for management of left lower back cellulitis x 4 days. She denies trauma but is unsure if she was bitten by an insect. She endorsed chills and home but none here. Upon admission, she was found to be febrile and tachycardic with leukocytosis and elevated blood pressure. IV Vancomycin was started but she developed an allergic reaction and was transitioned to IV clindamycin. Surgery was consulted; I &D planned in the OR later today.  Patient has a history of nonadherence to HD regimen including missed treatments and frequent sign-offs. She missed hemodialysis on Thursday due to pain, but has had two treatments this week. (Dialyzed Monday as make-up for 4/11.)  Past Medical History  Diagnosis Date  . Anemia   . Depression   . Reflux   . Dizziness   . Anxiety   . ADD (attention deficit disorder)   . Asthma   . Headache(784.0)   . Gout   . Anemia   . GERD (gastroesophageal reflux disease)   . Obesity   . Gastritis   . Blood transfusion 06/2011    6 units transfused at [facility]  . Arthritis     knee, wrist, back   . Bronchitis   . Shortness of breath   . Sleep apnea     does not use CPAP  . Neuromuscular disorder     carpal tunnel  . Hypertension     sees Dr. Wendie Agreste  . Chronic kidney disease     Atrophic left kidney, sees Dr. Hassell Done  . Renal insufficiency    Past Surgical History  Procedure Laterality Date  . Cesarean section    . Spine surgery  2004    Lumbar diskectomy   . Microdiskectomy  04/2001    Herniated disk L5-S1  . Renal biopsy  04/2004  . Wisdom tooth extraction    . Carpel tunnel release      x 2; bilateral  . Upper gastrointestinal  endoscopy    . Colonscopy    . Dilation and curettage of uterus    . Av fistula surgery  11/30/11    [facility] - redo fistula  . Hysteroscopy with thermachoice  12/10/2011    Procedure: HYSTEROSCOPY WITH THERMACHOICE;  Surgeon: Betsy Coder, MD;  Location: [facility];  Service: Gynecology;  Laterality: N/A;  . Shuntogram  Dec. 18, 2013    Left arm  . Av fistula placement  06/10/11    Left brachiocephalic AVF   Family History  Problem Relation Age of Onset  . Hypertension Mother   . Heart disease Father     Heart Disease before age 68  . Hypertension Father   . Heart attack Father   . Hypertension Daughter    Social History:  reports that she has been smoking Cigarettes.  She has a 10 pack-year smoking history. She has never used smokeless tobacco. She reports that she drinks alcohol. She reports that she does not use illicit drugs. Allergies  Allergen Reactions  . Nsaids Other (See Comments)    Renal failure  . Clarithromycin Nausea And Vomiting  . Demerol Nausea And Vomiting and Other (See Comments)    Severe headaches  . Dilaudid [Hydromorphone Hcl] Nausea And Vomiting and Other (See Comments)  Headache   . Fentanyl Nausea And Vomiting  . Morphine And Related Nausea And Vomiting    Severe headache. p does not want it!  . Other Other (See Comments)    Seeds and nuts- has diverticulitis   . Vancomycin Hives   Prior to Admission medications   Medication Sig Start Date End Date Taking? Authorizing Provider  acetaminophen (TYLENOL) 500 MG tablet Take 1,000 mg by mouth daily as needed. For headache   Yes Historical Provider, MD  albuterol (PROVENTIL HFA;VENTOLIN HFA) 108 (90 BASE) MCG/ACT inhaler Inhale 2 puffs into the lungs every 6 (six) hours as needed. For shortness of breath    Yes Historical Provider, MD  allopurinol (ZYLOPRIM) 300 MG tablet Take 300 mg by mouth at bedtime.    Yes Historical Provider, MD  ALPRAZolam Duanne Moron) 0.5 MG tablet Take 0.5 mg by mouth 3 (three)  times daily.    Yes Historical Provider, MD  amLODipine (NORVASC) 10 MG tablet Take 10 mg by mouth daily. 02/08/14  Yes Historical Provider, MD  budesonide-formoterol (SYMBICORT) 160-4.5 MCG/ACT inhaler Inhale 2 puffs into the lungs 2 (two) times daily.   Yes Historical Provider, MD  calcium acetate (PHOSLO) 667 MG capsule Take 667 mg by mouth 3 (three) times daily with meals.   Yes Historical Provider, MD  cyclobenzaprine (FLEXERIL) 10 MG tablet Take 10 mg by mouth 2 (two) times daily as needed for muscle spasms. For muscle spasms   Yes Historical Provider, MD  esomeprazole (NEXIUM) 40 MG capsule Take 40 mg by mouth daily.    Yes Historical Provider, MD  furosemide (LASIX) 80 MG tablet Take 80 mg by mouth 2 (two) times daily.    Yes Historical Provider, MD  HYDROcodone-acetaminophen (NORCO/VICODIN) 5-325 MG per tablet Take 1 tablet by mouth every 6 (six) hours as needed for pain.   Yes Historical Provider, MD  lidocaine-prilocaine (EMLA) cream Apply 1 application topically 3 (three) times a week. For dialysis port.   Yes Historical Provider, MD  lisinopril (PRINIVIL,ZESTRIL) 20 MG tablet Take 20 mg by mouth daily.  04/06/13  Yes Historical Provider, MD  Melatonin 5 MG TABS Take 5 mg by mouth daily.   Yes Historical Provider, MD  methocarbamol (ROBAXIN) 500 MG tablet Take 500 mg by mouth 2 (two) times daily.  03/22/13  Yes Historical Provider, MD  montelukast (SINGULAIR) 10 MG tablet Take 10 mg by mouth every morning.    Yes Historical Provider, MD  multivitamin (RENA-VIT) TABS tablet Take 1 tablet by mouth daily.   Yes Historical Provider, MD  oxymorphone (OPANA ER) 20 MG 12 hr tablet Take 20 mg by mouth every 12 (twelve) hours.   Yes Historical Provider, MD  prochlorperazine (COMPAZINE) 5 MG tablet Take 5 mg by mouth daily as needed. 01/08/14  Yes Historical Provider, MD  promethazine (PHENERGAN) 25 MG tablet Take 25 mg by mouth every 6 (six) hours as needed. For nausea   Yes Historical Provider, MD   SUMAtriptan (IMITREX) 50 MG tablet Take 50 mg by mouth daily as needed. For migraines 02/14/14  Yes Historical Provider, MD   Current Facility-Administered Medications  Medication Dose Route Frequency Provider Last Rate Last Dose  . [facility] HOLD] 0.9 %  sodium chloride infusion  250 mL Intravenous PRN Shanda Howells, MD      . [facility] Sou HOLD] acetaminophen (TYLENOL) tablet 1,000 mg  1,000 mg Oral Daily PRN Shanda Howells, MD      . Doug Sou HOLD] albuterol (PROVENTIL) (2.5 MG/3ML) 0.083% nebulizer solution 2.5  mg  2.5 mg Nebulization Q6H PRN Shanda Howells, MD      . Doug Sou HOLD] allopurinol (ZYLOPRIM) tablet 300 mg  300 mg Oral QHS Shanda Howells, MD   300 mg at 02/14/14 2215  . [MAR HOLD] ALPRAZolam Duanne Moron) tablet 0.5 mg  0.5 mg Oral TID Shanda Howells, MD   0.5 mg at 02/15/14 1028  . [MAR HOLD] amLODipine (NORVASC) tablet 10 mg  10 mg Oral Daily Shanda Howells, MD   10 mg at 02/15/14 1025  . [facility] HOLD] antiseptic oral rinse (BIOTENE) solution 15 mL  15 mL Mouth Rinse BID Shanda Howells, MD   15 mL at 02/15/14 0845  . [MAR HOLD] budesonide-formoterol (SYMBICORT) 160-4.5 MCG/ACT inhaler 2 puff  2 puff Inhalation BID Shanda Howells, MD   2 puff at 02/15/14 0846  . [MAR HOLD] calcium acetate (PHOSLO) capsule 667 mg  667 mg Oral TID WC Shanda Howells, MD   667 mg at 02/15/14 1301  . [MAR HOLD] clindamycin (CLEOCIN) IVPB 600 mg  600 mg Intravenous 3 times per day Verlee Monte, MD      . [facility] HOLD] cyclobenzaprine (FLEXERIL) tablet 10 mg  10 mg Oral BID PRN Shanda Howells, MD      . [facility] HOLD] fentaNYL (SUBLIMAZE) injection 50 mcg  50 mcg Intravenous Q1H PRN Imogene Burn. Tsuei, MD      . [facility] HOLD] heparin injection 5,000 Units  5,000 Units Subcutaneous 3 times per day Shanda Howells, MD   5,000 Units at 02/15/14 0505  . [facility] HOLD] hydrALAZINE (APRESOLINE) injection 5 mg  5 mg Intravenous Q4H PRN Shanda Howells, MD      . [facility] Sou HOLD] HYDROcodone-acetaminophen (NORCO/VICODIN) 5-325 MG per tablet 1 tablet  1 tablet Oral Q6H PRN  Shanda Howells, MD   1 tablet at 02/15/14 1024  . [MAR HOLD] lisinopril (PRINIVIL,ZESTRIL) tablet 20 mg  20 mg Oral Daily Shanda Howells, MD   20 mg at 02/15/14 1025  . [MAR HOLD] methocarbamol (ROBAXIN) tablet 500 mg  500 mg Oral BID Shanda Howells, MD   500 mg at 02/15/14 1025  . [MAR HOLD] montelukast (SINGULAIR) tablet 10 mg  10 mg Oral Daily Shanda Howells, MD   10 mg at 02/15/14 1024  . [facility] HOLD] multivitamin (RENA-VIT) tablet 1 tablet  1 tablet Oral QHS Shanda Howells, MD   1 tablet at 02/14/14 2215  . [MAR HOLD] nicotine (NICODERM CQ - dosed in mg/24 hours) patch 21 mg  21 mg Transdermal Daily Rhetta Mura Schorr, NP   21 mg at 02/15/14 0151  . [MAR HOLD] ondansetron (ZOFRAN) injection 4 mg  4 mg Intravenous Q4H PRN Imogene Burn. Tsuei, MD      . [facility]ael[facility] HOLD] OxyCODONE (OXYCONTIN) 12 hr tablet 40 mg  40 mg Oral Q12H Verlee Monte, MD   40 mg at 02/15/14 1024  . [MAR HOLD] pantoprazole (PROTONIX) EC tablet 80 mg  80 mg Oral Q1200 Shanda Howells, MD   80 mg at 02/15/14 1300  . [facility] HOLD] prochlorperazine (COMPAZINE) tablet 5 mg  5 mg Oral Daily PRN Shanda Howells, MD      . [facility] HOLD] promethazine (PHENERGAN) tablet 25 mg  25 mg Oral Q6H PRN Shanda Howells, MD      . Doug Sou HOLD] sodium chloride 0.9 % injection 3 mL  3 mL Intravenous Q12H Shanda Howells, MD      . Doug Sou HOLD] sodium chloride 0.9 % injection 3 mL  3 mL Intravenous Q12H Shanda Howells, MD  3 mL at 02/15/14 1000  . [facility] HOLD] sodium chloride 0.9 % injection 3 mL  3 mL Intravenous PRN Shanda Howells, MD      . Doug Sou HOLD] SUMAtriptan (IMITREX) tablet 100 mg  100 mg Oral Daily PRN Melton Alar, PA-C       Labs: Basic Metabolic Panel:  Recent Labs Lab 02/14/14 1523 02/14/14 1925 02/15/14 0046  NA 139  --  140  K 3.1*  --  3.0*  CL 93*  --  97  CO2 25  --  24  GLUCOSE 147*  --  115*  BUN 34*  --  38*  CREATININE 5.86* 5.84* 6.15*  CALCIUM 9.6  --  9.7   Liver Function Tests:  Recent Labs Lab 02/15/14 0046  AST 9  ALT 11   ALKPHOS 72  BILITOT <0.2*  PROT 6.2  ALBUMIN 2.5*   CBC:  Recent Labs Lab 02/14/14 1523 02/14/14 1925 02/15/14 0046  WBC 26.5* 27.8* 24.0*  NEUTROABS 23.0*  --  19.9*  HGB 10.9* 10.4* 10.2*  HCT 33.7* 32.4* 31.9*  MCV 102.7* 101.9* 102.2*  PLT 235 222 225   Iron Studies:  Recent Labs  02/14/14 2025  IRON 14*  TIBC 149*  FERRITIN 1167*   Studies/Results: Dg Chest Port 1 View  02/15/2014   CLINICAL DATA:  Preop  EXAM: PORTABLE CHEST - 1 VIEW  COMPARISON:  04/12/2012  FINDINGS: Cardiomegaly is noted. The study is limited by poor inspiration and portable technique. Mild interstitial prominence bilaterally without convincing pulmonary edema. No segmental infiltrate.  IMPRESSION: Limited study by poor inspiration and portable technique. Mild interstitial prominence bilaterally without convincing pulmonary edema. No segmental infiltrate.   Electronically Signed   By: Lahoma Crocker M.D.   On: 02/15/2014 13:00    ROS: Chronic back pain/multiple diagnoses, left lower flank cellulitis. 10 pt ROS asked and answered. All systems negative except as in HPI above.   Physical Exam: Filed Vitals:   02/15/14 0422 02/15/14 0430 02/15/14 1024 02/15/14 1233  BP: 174/121 170/98 158/72 162/74  Pulse: 109     Temp: 98.2 F (36.8 C)     Resp: 20     Height:      Weight: 108.4 kg (238 lb 15.7 oz)     SpO2: 95%        General: Well developed, well nourished, in no acute distress. Head: Normocephalic, atraumatic, sclera non-icteric, mucus membranes are moist Neck: Supple. JVD not elevated. Lungs: Diminished at bases, no appreciable wheezes, rales, or rhonchi. Breathing is unlabored. Heart: Tachy regular with S1 S2. No murmurs, rubs, or gallops appreciated. Abdomen: Obese, Soft, non-tender, non-distended with normoactive bowel sounds. Large, tender area of erythema/induration to left posterior flank M-S:  Strength and tone appear normal for age. Lower extremities: + LE edema. No ischemic  changes, no open wounds  Neuro: Alert and oriented X 3. Moves all extremities spontaneously. Psych:  Responds to questions appropriately with a normal affect. Dialysis Access: aneurysmal L AVF + bruit  Dialysis Orders: Center: [facility]  on TTS . EDW 105 kg HD Bath 2K/2Ca  Time 4 hrs Heparin 8000. Access L AVF  BFR 400 DFR 800  Hectorol 0 mcg IV/HD Epogen 2200  Units IV/HD  Venofer 50 mg IV/weekly. Fe Load x  5 doses preciously ordered - missed 3 doses due to spotty attendance) Recent labs: Hg 10.4, Tsat 23%, P 7.3, PTH 116  Assessment/Plan: 1. Sepsis/ Left flank cellulitis. On IV clindamycin. To OR today for  I & D 2. ESRD -  TTS, K+ 3.0. K Dur administered, recheck pending. HD tomorrow with added K+ bath. No heparin 3. Hypertension/volume  - SBPs 160s-170s on Lisinopril + amlodipine 10 mg which was just started op on 4/9. Volume excess due to frequent sign offs and pain likely contributing. Up ~3.5kg here with LE edema. Plan UF 4L as tolerated. 4. Anemia  - Hgb 10.2 on low dose Epo, consistent with op labs. Last Tsat 23%. Missed several doses of op Fe load due to poor attendance. Will make those up here then continue weekly Fe. Aranesp 25 q Sat. 5. Metabolic bone disease -  Ca 9.7 (10.9 corrected). Phos poorly controlled op due to spotty compliance with Phoslo and intolerance to fosrenol. Trial of Renvela packets ordered op but patient had not started. Not on Vit D. Low Ca bath as K+ tolerates 6. Nutrition - Alb 2.5. Renal diet when appropriate, multivit, supplements 7. Vancomycin allergy - new this admission. Outpatient HD unit notified. 8. Chronic pain - Multiple back pain dx's. Followed by pain clinic. 9. Anxiety/Depression - home meds  Sonnie Alamo, PA-C [facility] Pager [phone] 02/15/2014, 2:32 PM  I have seen and examined this patient and agree with plan per Sonnie Alamo.  43yo WF admitted for abscess of her back.  She is now SP I&D.  I reviewed her meds and labs.  Corrected  Ca sl high.  She was to DC phoslo and start renvela powder but had not done so yet.  Will plan HD in AM. Bennie Dallas 02/15/2014 7:16 PM

## 2014-02-15 NOTE — Progress Notes (Signed)
[facility] PROGRESS NOTE   [name] [id number] DOB: 1971/10/05 DOA: 02/14/2014 PCP: Enid Skeens., MD  HPI/Subjective: Complaining about a lot of pain, patient very emotional.  Assessment/Plan: Principal Problem:   Sepsis Active Problems:   HTN (hypertension)   End stage renal disease   Cellulitis    Sepsis -With WBC of 26.5, heart rate of 112 and temp of 99.6 plus cellulitis. -Started initially on vancomycin, reported some hives overnight so was discontinued. -Patient is on clindamycin.  Cellulitis -Cellulitis in the left side of the back of the patient. -General surgery to evaluate for possible I&D, continue clindamycin.  ESRD -Patient is on dialysis on Tuesday, Thursday and Saturday. -That she missed last Thursday dialysis secondary to the cellulitis/pain.  Hypertension -Blood pressure elevated, likely volume is contributing. -Suspect after dialysis patient blood pressure will improve.  Code Status: Full code Family Communication: Plan discussed with the patient. Disposition Plan: Remains inpatient   Consultants:  Nephrology.  General surgery  Procedures:  None  Antibiotics:  Clindamycin   Objective: Filed Vitals:   02/15/14 1024  BP: 158/72  Pulse:   Temp:   Resp:     Intake/Output Summary (Last 24 hours) at 02/15/14 1136 Last data filed at 02/15/14 0847  Gross per 24 hour  Intake    586 ml  Output      0 ml  Net    586 ml   Filed Weights   02/14/14 1520 02/14/14 2042 02/15/14 0422  Weight: 106.595 kg (235 lb) 109.9 kg (242 lb 4.6 oz) 108.4 kg (238 lb 15.7 oz)    Exam: General: Alert and awake, oriented x3, not in any acute distress. HEENT: anicteric sclera, pupils reactive to light and accommodation, EOMI CVS: S1-S2 clear, no murmur rubs or gallops Chest: clear to auscultation bilaterally, no wheezing, rales or rhonchi Abdomen: soft nontender, nondistended, normal bowel sounds, no  organomegaly Extremities: no cyanosis, clubbing or edema noted bilaterally Neuro: Cranial nerves II-XII intact, no focal neurological deficits  Data Reviewed: Basic Metabolic Panel:  Recent Labs Lab 02/14/14 1523 02/14/14 1925 02/14/14 2025 02/15/14 0046  NA 139  --   --  140  K 3.1*  --   --  3.0*  CL 93*  --   --  97  CO2 25  --   --  24  GLUCOSE 147*  --   --  115*  BUN 34*  --   --  38*  CREATININE 5.86* 5.84*  --  6.15*  CALCIUM 9.6  --   --  9.7  MG  --   --  1.9  --    Liver Function Tests:  Recent Labs Lab 02/15/14 0046  AST 9  ALT 11  ALKPHOS 72  BILITOT <0.2*  PROT 6.2  ALBUMIN 2.5*   No results found for this basename: LIPASE, AMYLASE,  in the last 168 hours No results found for this basename: AMMONIA,  in the last 168 hours CBC:  Recent Labs Lab 02/14/14 1523 02/14/14 1925 02/15/14 0046  WBC 26.5* 27.8* 24.0*  NEUTROABS 23.0*  --  19.9*  HGB 10.9* 10.4* 10.2*  HCT 33.7* 32.4* 31.9*  MCV 102.7* 101.9* 102.2*  PLT 235 222 225   Cardiac Enzymes: No results found for this basename: CKTOTAL, CKMB, CKMBINDEX, TROPONINI,  in the last 168 hours BNP (last 3 results) No results found for this basename: PROBNP,  in the last 8760 hours CBG: No results found for this basename: GLUCAP,  in the last  168 hours  Micro No results found for this or any previous visit (from the past 240 hour(s)).   Studies: No results found.  Scheduled Meds: . allopurinol  300 mg Oral QHS  . ALPRAZolam  0.5 mg Oral TID  . amLODipine  10 mg Oral Daily  . antiseptic oral rinse  15 mL Mouth Rinse BID  . budesonide-formoterol  2 puff Inhalation BID  . calcium acetate  667 mg Oral TID WC  . clindamycin (CLEOCIN) IV  600 mg Intravenous 3 times per day  . heparin  5,000 Units Subcutaneous 3 times per day  . lisinopril  20 mg Oral Daily  . methocarbamol  500 mg Oral BID  . montelukast  10 mg Oral Daily  . multivitamin  1 tablet Oral QHS  . nicotine  21 mg Transdermal Daily   . OxyCODONE  40 mg Oral Q12H  . oxyCODONE-acetaminophen  1-2 tablet Oral Once  . pantoprazole  80 mg Oral Q1200  . sodium chloride  3 mL Intravenous Q12H  . sodium chloride  3 mL Intravenous Q12H   Continuous Infusions:      Time spent: 35 minutes    Verlee Monte  [facility] Pager [phone] If 7PM-7AM, please contact night-coverage at [email or web address], password [facility] 02/15/2014, 11:36 AM  LOS: 1 day

## 2014-02-15 NOTE — Progress Notes (Signed)
Prior to giving pt pain medicine, this RN verified with pt that she does not have any itching or difficulties breathing with fentanyl or dilaudid, pt states the medications can make her nauseated, Fentanyl was given for pain, pt tol/well but cont'd to have pain in back, Spoke with Dr.Smith, new orders rec'd for dilaudid and to start with small doses, pt tolerating dilaudid well

## 2014-02-15 NOTE — Transfer of Care (Signed)
Immediate Anesthesia Transfer of Care Note  Patient: [name]  Procedure(s) Performed: Procedure(s): IRRIGATION AND DRAINAGE OF LOW BACK ABSCESS (Left)  Patient Location: PACU  Anesthesia Type:General  Level of Consciousness: awake, alert  and oriented  Airway & Oxygen Therapy: Patient Spontanous Breathing and Patient connected to face mask oxygen  Post-op Assessment: Report given to PACU RN, Post -op Vital signs reviewed and stable and Patient moving all extremities X 4  Post vital signs: Reviewed and stable  Complications: No apparent anesthesia complications

## 2014-02-15 NOTE — Progress Notes (Signed)
Pt c/o pain. On call physician notified. Went in to pt's room to ask about reaction to morphine. Pt currently sleeping. No new orders written at this time. Will continue to monitor.

## 2014-02-15 NOTE — Progress Notes (Signed)
UR completed. Patient changed to inpatient- requiring IV antibiotics.  

## 2014-02-15 NOTE — Op Note (Addendum)
Pre-Op diagnosis - left back abscess Post-op diagnosis - same Procedure - incision and drainage of left back abscess (skin, subcutaneous tissue - 15 cm2) Surgeon:  Imogene Burn. Tlaloc Taddei Anesthesia:  Gen  Indications - 43 yo female with multiple medical issues presents with spreading erythema and tenderness in her left lateral lumbar back region.  She was felt to have a probable abscess under this area.  She presents now for surgical debridement.  Procedure: The patient brought to the operating room and placed in a supine position on the operating room table. After an adequate level of general anesthesia was obtained, she was rotated to her right side with her left back exposed. This area is prepped with chlor prep and draped sterile fashion. A timeout was taken to ensure the proper patient proper procedure. She has a small eschar in the center of the most indurated area. I made a small circular incision this area with a scalpel. Immediately, we encountered some purulent fluid. This was cultured and sent for microbiology. We opened the incision wider and I explored this with a finger. This seems to tunnel down into the deep abscess cavity. This is about 6 cm deep. The abscess cavity measures 2 x 7 cm. We open was widely and irrigated thoroughly with saline. Posteriorly there is another small punctate opening with some purulence. I opened this as well and all the seems to be fairly superficial. There is no deep component of the second area. We irrigated the large abscess cavity thoroughly and used cautery for hemostasis. The wound was packed with saline moistened gauze and a dry dressing was applied. The patient was then expanded brought to recovery in stable condition. All sponge, initially, and needle counts are correct.  Imogene Burn. Georgette Dover, MD, [facility] Surgery  General/ Trauma Surgery  02/15/2014 4:38 PM

## 2014-02-15 NOTE — Consult Note (Signed)
Reason for Consult: left lower back cellulitis and abscess  Referring Physician: Dr. Verlee Monte   HPI: [name] is a 43 year old morbidly obese female with a history of chronic pain, ESRD on hemodialysis, tobacco abuse who presented with left lower back pain and swelling.  The patient states she noticed pain and redness about 3 days ago.  Her significant other "popped the pimple." there was purulent drainage.  He then placed neosporin to the area which only continued to get worse over time.  No known trauma to the area.  Denies history of diabetes.  She reports fever, chills and sweats.  Pain is severe.  She was found to have a WBC of 26.5K which is down to 24K this morning.  She has been afebrile.  She exhibits tachycardia and is hypertensive.  She denies previous cardiac history.  She does not ambulate much due to her chronic pain.  She does not take anticoagulants.   She has not had any imaging, but apparently had a bedside US which showed an abscess.     Past Medical History  Diagnosis Date  . Anemia   . Depression   . Reflux   . Dizziness   . Anxiety   . ADD (attention deficit disorder)   . Asthma   . Headache(784.0)   . Gout   . Anemia   . GERD (gastroesophageal reflux disease)   . Obesity   . Gastritis   . Blood transfusion 06/2011    6 units transfused at [facility]  . Arthritis     knee, wrist, back   . Bronchitis   . Shortness of breath   . Sleep apnea     does not use CPAP  . Neuromuscular disorder     carpal tunnel  . Hypertension     sees Dr. Wendie Agreste  . Chronic kidney disease     Atrophic left kidney, sees Dr. Hassell Done  . Renal insufficiency     Past Surgical History  Procedure Laterality Date  . Cesarean section    . Spine surgery  2004    Lumbar diskectomy   . Microdiskectomy  04/2001    Herniated disk L5-S1  . Renal biopsy  04/2004  . Wisdom tooth extraction    . Carpel tunnel release      x 2; bilateral  . Upper gastrointestinal endoscopy    .  Colonscopy    . Dilation and curettage of uterus    . Av fistula surgery  11/30/11    [facility] - redo fistula  . Hysteroscopy with thermachoice  12/10/2011    Procedure: HYSTEROSCOPY WITH THERMACHOICE;  Surgeon: Betsy Coder, MD;  Location: [facility];  Service: Gynecology;  Laterality: N/A;  . Shuntogram  Dec. 18, 2013    Left arm  . Av fistula placement  06/10/11    Left brachiocephalic AVF    Family History  Problem Relation Age of Onset  . Hypertension Mother   . Heart disease Father     Heart Disease before age 2  . Hypertension Father   . Heart attack Father   . Hypertension Daughter     Social History:  reports that she has been smoking Cigarettes.  She has a 10 pack-year smoking history. She has never used smokeless tobacco. She reports that she drinks alcohol. She reports that she does not use illicit drugs.  Allergies:  Allergies  Allergen Reactions  . Nsaids Other (See Comments)    Renal failure  .  Clarithromycin Nausea And Vomiting  . Demerol Nausea And Vomiting and Other (See Comments)    Severe headaches  . Dilaudid [Hydromorphone Hcl] Nausea And Vomiting and Other (See Comments)    Headache   . Fentanyl Nausea And Vomiting  . Morphine And Related Nausea And Vomiting    Severe headache. p does not want it!  . Other Other (See Comments)    Seeds and nuts- has diverticulitis   . Vancomycin Hives    Medications:  Scheduled Meds: . allopurinol  300 mg Oral QHS  . ALPRAZolam  0.5 mg Oral TID  . amLODipine  10 mg Oral Daily  . antiseptic oral rinse  15 mL Mouth Rinse BID  . budesonide-formoterol  2 puff Inhalation BID  . calcium acetate  667 mg Oral TID WC  . clindamycin (CLEOCIN) IV  600 mg Intravenous 3 times per day  . heparin  5,000 Units Subcutaneous 3 times per day  . lisinopril  20 mg Oral Daily  . methocarbamol  500 mg Oral BID  . montelukast  10 mg Oral Daily  . multivitamin  1 tablet Oral QHS  . nicotine  21 mg Transdermal Daily  . OxyCODONE   40 mg Oral Q12H  . pantoprazole  80 mg Oral Q1200  . sodium chloride  3 mL Intravenous Q12H  . sodium chloride  3 mL Intravenous Q12H   Continuous Infusions:  PRN Meds:.sodium chloride, acetaminophen, albuterol, cyclobenzaprine, hydrALAZINE, HYDROcodone-acetaminophen, prochlorperazine, promethazine, sodium chloride, SUMAtriptan  Results for orders placed during the hospital encounter of 02/14/14 (from the past 48 hour(s))  CBC WITH DIFFERENTIAL     Status: Abnormal   Collection Time    02/14/14  3:23 PM      Result Value Ref Range   WBC 26.5 (*) 4.0 - 10.5 K/uL   RBC 3.28 (*) 3.87 - 5.11 MIL/uL   Hemoglobin 10.9 (*) 12.0 - 15.0 g/dL   HCT 33.7 (*) 36.0 - 46.0 %   MCV 102.7 (*) 78.0 - 100.0 fL   MCH 33.2  26.0 - 34.0 pg   MCHC 32.3  30.0 - 36.0 g/dL   RDW 15.8 (*) 11.5 - 15.5 %   Platelets 235  150 - 400 K/uL   Neutrophils Relative % 87 (*) 43 - 77 %   Lymphocytes Relative 9 (*) 12 - 46 %   Monocytes Relative 4  3 - 12 %   Eosinophils Relative 0  0 - 5 %   Basophils Relative 0  0 - 1 %   Neutro Abs 23.0 (*) 1.7 - 7.7 K/uL   Lymphs Abs 2.4  0.7 - 4.0 K/uL   Monocytes Absolute 1.1 (*) 0.1 - 1.0 K/uL   Eosinophils Absolute 0.0  0.0 - 0.7 K/uL   Basophils Absolute 0.0  0.0 - 0.1 K/uL   RBC Morphology POLYCHROMASIA PRESENT     WBC Morphology WHITE COUNT CONFIRMED ON SMEAR     Comment: SLIGHT TOXIC GRANULATION NOTED  BASIC METABOLIC PANEL     Status: Abnormal   Collection Time    02/14/14  3:23 PM      Result Value Ref Range   Sodium 139  137 - 147 mEq/L   Potassium 3.1 (*) 3.7 - 5.3 mEq/L   Chloride 93 (*) 96 - 112 mEq/L   CO2 25  19 - 32 mEq/L   Glucose, Bld 147 (*) 70 - 99 mg/dL   BUN 34 (*) 6 - 23 mg/dL   Creatinine, Ser 5.86 (*)  0.50 - 1.10 mg/dL   Calcium 9.6  8.4 - 10.5 mg/dL   GFR calc non Af Amer 8 (*) >90 mL/min   GFR calc Af Amer 9 (*) >90 mL/min   Comment: (NOTE)     The eGFR has been calculated using the CKD EPI equation.     This calculation has not been  validated in all clinical situations.     eGFR's persistently <90 mL/min signify possible Chronic Kidney     Disease.  CBC     Status: Abnormal   Collection Time    02/14/14  7:25 PM      Result Value Ref Range   WBC 27.8 (*) 4.0 - 10.5 K/uL   RBC 3.18 (*) 3.87 - 5.11 MIL/uL   Hemoglobin 10.4 (*) 12.0 - 15.0 g/dL   HCT 32.4 (*) 36.0 - 46.0 %   MCV 101.9 (*) 78.0 - 100.0 fL   MCH 32.7  26.0 - 34.0 pg   MCHC 32.1  30.0 - 36.0 g/dL   RDW 15.7 (*) 11.5 - 15.5 %   Platelets 222  150 - 400 K/uL  CREATININE, SERUM     Status: Abnormal   Collection Time    02/14/14  7:25 PM      Result Value Ref Range   Creatinine, Ser 5.84 (*) 0.50 - 1.10 mg/dL   GFR calc non Af Amer 8 (*) >90 mL/min   GFR calc Af Amer 9 (*) >90 mL/min   Comment: (NOTE)     The eGFR has been calculated using the CKD EPI equation.     This calculation has not been validated in all clinical situations.     eGFR's persistently <90 mL/min signify possible Chronic Kidney     Disease.  HEMOGLOBIN A1C     Status: None   Collection Time    02/14/14  7:25 PM      Result Value Ref Range   Hemoglobin A1C 4.8  <5.7 %   Comment: (NOTE)                                                                               According to the ADA Clinical Practice Recommendations for 2011, when     HbA1c is used as a screening test:      >=6.5%   Diagnostic of Diabetes Mellitus               (if abnormal result is confirmed)     5.7-6.4%   Increased risk of developing Diabetes Mellitus     References:Diagnosis and Classification of Diabetes Mellitus,Diabetes     YKDX,8338,25(KNLZJ 1):S62-S69 and Standards of Medical Care in             Diabetes - 2011,Diabetes QBHA,1937,90 (Suppl 1):S11-S61.   Mean Plasma Glucose 91  <117 mg/dL   Comment: Performed at [facility]     Status: None   Collection Time    02/14/14  8:25 PM      Result Value Ref Range   Vitamin B-12 763  211 - 911 pg/mL   Comment: Performed at [facility]     Status: None  Collection Time    02/14/14  8:25 PM      Result Value Ref Range   Folate >20.0     Comment: (NOTE)     Reference Ranges            Deficient:       0.4 - 3.3 ng/mL            Indeterminate:   3.4 - 5.4 ng/mL            Normal:              > 5.4 ng/mL     Performed at [facility]     Status: Abnormal   Collection Time    02/14/14  8:25 PM      Result Value Ref Range   Iron 14 (*) 42 - 135 ug/dL   TIBC 149 (*) 250 - 470 ug/dL   Saturation Ratios 9 (*) 20 - 55 %   UIBC 135  125 - 400 ug/dL   Comment: Performed at [facility]     Status: Abnormal   Collection Time    02/14/14  8:25 PM      Result Value Ref Range   Ferritin 1167 (*) 10 - 291 ng/mL   Comment: Performed at [facility]     Status: Abnormal   Collection Time    02/14/14  8:25 PM      Result Value Ref Range   Retic Ct Pct 3.1  0.4 - 3.1 %   RBC. 3.12 (*) 3.87 - 5.11 MIL/uL   Retic Count, Manual 96.7  19.0 - 186.0 K/uL  MAGNESIUM     Status: None   Collection Time    02/14/14  8:25 PM      Result Value Ref Range   Magnesium 1.9  1.5 - 2.5 mg/dL  COMPREHENSIVE METABOLIC PANEL     Status: Abnormal   Collection Time    02/15/14 12:46 AM      Result Value Ref Range   Sodium 140  137 - 147 mEq/L   Potassium 3.0 (*) 3.7 - 5.3 mEq/L   Chloride 97  96 - 112 mEq/L   CO2 24  19 - 32 mEq/L   Glucose, Bld 115 (*) 70 - 99 mg/dL   BUN 38 (*) 6 - 23 mg/dL   Creatinine, Ser 6.15 (*) 0.50 - 1.10 mg/dL   Calcium 9.7  8.4 - 10.5 mg/dL   Total Protein 6.2  6.0 - 8.3 g/dL   Albumin 2.5 (*) 3.5 - 5.2 g/dL   AST 9  0 - 37 U/L   ALT 11  0 - 35 U/L   Alkaline Phosphatase 72  39 - 117 U/L   Total Bilirubin <0.2 (*) 0.3 - 1.2 mg/dL   GFR calc non Af Amer 8 (*) >90 mL/min   GFR calc Af Amer 9 (*) >90 mL/min   Comment: (NOTE)     The eGFR has been calculated using the CKD EPI equation.     This calculation has not been  validated in all clinical situations.     eGFR's persistently <90 mL/min signify possible Chronic Kidney     Disease.  CBC WITH DIFFERENTIAL     Status: Abnormal   Collection Time    02/15/14 12:46 AM      Result Value Ref Range   WBC 24.0 (*) 4.0 - 10.5 K/uL   RBC 3.12 (*)  3.87 - 5.11 MIL/uL   Hemoglobin 10.2 (*) 12.0 - 15.0 g/dL   HCT 31.9 (*) 36.0 - 46.0 %   MCV 102.2 (*) 78.0 - 100.0 fL   MCH 32.7  26.0 - 34.0 pg   MCHC 32.0  30.0 - 36.0 g/dL   RDW 15.8 (*) 11.5 - 15.5 %   Platelets 225  150 - 400 K/uL   Neutrophils Relative % 83 (*) 43 - 77 %   Neutro Abs 19.9 (*) 1.7 - 7.7 K/uL   Lymphocytes Relative 11 (*) 12 - 46 %   Lymphs Abs 2.6  0.7 - 4.0 K/uL   Monocytes Relative 6  3 - 12 %   Monocytes Absolute 1.4 (*) 0.1 - 1.0 K/uL   Eosinophils Relative 0  0 - 5 %   Eosinophils Absolute 0.0  0.0 - 0.7 K/uL   Basophils Relative 0  0 - 1 %   Basophils Absolute 0.0  0.0 - 0.1 K/uL  TSH     Status: None   Collection Time    02/15/14 12:46 AM      Result Value Ref Range   TSH 0.536  0.350 - 4.500 uIU/mL   Comment: Please note change in reference range.    No results found.  Review of Systems  All other systems reviewed and are negative.  Blood pressure 158/72, pulse 109, temperature 98.2 F (36.8 C), resp. rate 20, height 5' 4"  (1.626 m), weight 238 lb 15.7 oz (108.4 kg), last menstrual period 01/31/2014, SpO2 95.00%. Physical Exam  Constitutional: She is oriented to person, place, and time. She appears well-developed and well-nourished. No distress.  Cardiovascular: Normal rate, regular rhythm, normal heart sounds and intact distal pulses.  Exam reveals no gallop and no friction rub.   No murmur heard. Respiratory: Effort normal and breath sounds normal.  GI: Soft. Bowel sounds are normal. There is no tenderness. There is no guarding.  Neurological: She is alert and oriented to person, place, and time.  Skin: Skin is warm and dry. She is not diaphoretic.  approx 20x30cm  area of cellulitis to left lower back.  10x12cm area of induration.  There is a pinpoint opening inferior to this area that is without any drainage.   Psychiatric: Judgment and thought content normal.  Seems anxious    Assessment/Plan: Left lower back cellulitis and likely an abscess -NPO for now.  Dr. Georgette Dover to evaluate for possible  I&d  -agree with antibiotics.  She reports an allergic reaction to Vancomycin last night -obtain an EKG and CXR -further recommendations to follow  Ghazal Pevey ANP-BC 02/15/2014, 10:36 AM

## 2014-02-15 NOTE — Anesthesia Preprocedure Evaluation (Signed)
Anesthesia Evaluation  Patient identified by MRN, date of birth, ID band Patient awake    Reviewed: Allergy & Precautions, H&P , NPO status , Patient's Chart, lab work & pertinent test results  Airway Mallampati: II  Neck ROM: full    Dental   Pulmonary shortness of breath, asthma , sleep apnea , Current Smoker,          Cardiovascular hypertension,     Neuro/Psych  Headaches, Anxiety Depression  Neuromuscular disease    GI/Hepatic GERD-  ,  Endo/Other  Morbid obesity  Renal/GU ESRF and DialysisRenal disease     Musculoskeletal   Abdominal   Peds  Hematology  (+) anemia ,   Anesthesia Other Findings   Reproductive/Obstetrics                           Anesthesia Physical Anesthesia Plan  ASA: III  Anesthesia Plan: General   Post-op Pain Management:    Induction: Intravenous  Airway Management Planned: Oral ETT  Additional Equipment:   Intra-op Plan:   Post-operative Plan: Extubation in OR  Informed Consent: I have reviewed the patients History and Physical, chart, labs and discussed the procedure including the risks, benefits and alternatives for the proposed anesthesia with the patient or authorized representative who has indicated his/her understanding and acceptance.     Plan Discussed with: CRNA, Anesthesiologist and Surgeon  Anesthesia Plan Comments:         Anesthesia Quick Evaluation

## 2014-02-15 NOTE — Progress Notes (Signed)
Pt admitted to unit from ED. Pt A&O, VS stable with an elevated BP. Will give BP meds when arrive from pharmacy. Pt has cellulitis to L lower back. Pt refused safety video at this time. Pt placed on telemetry, call bell within reach, and currently resting comfortably in bed. Will continue to monitor.

## 2014-02-16 DIAGNOSIS — N189 Chronic kidney disease, unspecified: Secondary | ICD-10-CM

## 2014-02-16 DIAGNOSIS — E8779 Other fluid overload: Secondary | ICD-10-CM | POA: Diagnosis not present

## 2014-02-16 DIAGNOSIS — R609 Edema, unspecified: Secondary | ICD-10-CM

## 2014-02-16 DIAGNOSIS — I10 Essential (primary) hypertension: Secondary | ICD-10-CM | POA: Diagnosis not present

## 2014-02-16 DIAGNOSIS — A419 Sepsis, unspecified organism: Secondary | ICD-10-CM | POA: Diagnosis not present

## 2014-02-16 DIAGNOSIS — L0291 Cutaneous abscess, unspecified: Secondary | ICD-10-CM | POA: Diagnosis not present

## 2014-02-16 DIAGNOSIS — D649 Anemia, unspecified: Secondary | ICD-10-CM | POA: Diagnosis not present

## 2014-02-16 DIAGNOSIS — N186 End stage renal disease: Secondary | ICD-10-CM | POA: Diagnosis not present

## 2014-02-16 DIAGNOSIS — N179 Acute kidney failure, unspecified: Secondary | ICD-10-CM

## 2014-02-16 LAB — CBC WITH DIFFERENTIAL/PLATELET
BASOS ABS: 0 10*3/uL (ref 0.0–0.1)
Basophils Relative: 0 % (ref 0–1)
Eosinophils Absolute: 0 10*3/uL (ref 0.0–0.7)
Eosinophils Relative: 0 % (ref 0–5)
HCT: 30.4 % — ABNORMAL LOW (ref 36.0–46.0)
Hemoglobin: 9.6 g/dL — ABNORMAL LOW (ref 12.0–15.0)
LYMPHS ABS: 2.6 10*3/uL (ref 0.7–4.0)
Lymphocytes Relative: 14 % (ref 12–46)
MCH: 32.5 pg (ref 26.0–34.0)
MCHC: 31.6 g/dL (ref 30.0–36.0)
MCV: 103.1 fL — ABNORMAL HIGH (ref 78.0–100.0)
MONO ABS: 1.2 10*3/uL — AB (ref 0.1–1.0)
Monocytes Relative: 7 % (ref 3–12)
NEUTROS PCT: 79 % — AB (ref 43–77)
Neutro Abs: 14.5 10*3/uL — ABNORMAL HIGH (ref 1.7–7.7)
PLATELETS: 239 10*3/uL (ref 150–400)
RBC: 2.95 MIL/uL — AB (ref 3.87–5.11)
RDW: 15.5 % (ref 11.5–15.5)
WBC: 18.4 10*3/uL — ABNORMAL HIGH (ref 4.0–10.5)

## 2014-02-16 MED ORDER — BISACODYL 5 MG PO TBEC
10.0000 mg | DELAYED_RELEASE_TABLET | Freq: Once | ORAL | Status: AC
  Administered 2014-02-16: 10 mg via ORAL
  Filled 2014-02-16: qty 2

## 2014-02-16 MED ORDER — OXYCODONE-ACETAMINOPHEN 5-325 MG PO TABS
1.0000 | ORAL_TABLET | Freq: Four times a day (QID) | ORAL | Status: DC | PRN
  Administered 2014-02-16 – 2014-02-17 (×3): 2 via ORAL
  Filled 2014-02-16 (×3): qty 2

## 2014-02-16 MED ORDER — DOCUSATE SODIUM 100 MG PO CAPS
100.0000 mg | ORAL_CAPSULE | Freq: Two times a day (BID) | ORAL | Status: DC | PRN
  Administered 2014-02-17: 100 mg via ORAL
  Filled 2014-02-16: qty 1

## 2014-02-16 NOTE — Progress Notes (Signed)
1 Day Post-Op  Subjective: Upset that she wasn't told what was found during surgery and that no one spoke with boyfriend. Sore. Drainage thru dressing  Pt premedicated with IV pain med Objective: Vital signs in last 24 hours: Temp:  [98.4 F (36.9 C)-98.7 F (37.1 C)] 98.7 F (37.1 C) (04/18 0514) Pulse Rate:  [98-113] 108 (04/18 0514) Resp:  [11-27] 20 (04/18 0514) BP: (145-166)/(72-107) 151/99 mmHg (04/18 0514) SpO2:  [88 %-99 %] 93 % (04/18 0743) Weight:  [239 lb 3.2 oz (108.5 kg)] 239 lb 3.2 oz (108.5 kg) (04/18 0514) Last BM Date: 02/13/14  Intake/Output from previous day: 04/17 0701 - 04/18 0700 In: 501 [P.O.:118; I.V.:283; IV Piggyback:100] Out: 20 [Blood:20] Intake/Output this shift: Total I/O In: 342 [P.O.:342] Out: -   Alert, nontoxic Left lower back - dressing with serosang drainage; dressing taken down, tolerated ok but tearful and had to stop at times. Deep wound. No purulence. Small more medial wound - not too deep, no purulence. No necrotic tissue in either one  Lab Results:   Recent Labs  02/15/14 0046 02/16/14 0548  WBC 24.0* 18.4*  HGB 10.2* 9.6*  HCT 31.9* 30.4*  PLT 225 239   BMET  Recent Labs  02/14/14 1523 02/14/14 1925 02/15/14 0046  NA 139  --  140  K 3.1*  --  3.0*  CL 93*  --  97  CO2 25  --  24  GLUCOSE 147*  --  115*  BUN 34*  --  38*  CREATININE 5.86* 5.84* 6.15*  CALCIUM 9.6  --  9.7   PT/INR No results found for this basename: LABPROT, INR,  in the last 72 hours ABG No results found for this basename: PHART, PCO2, PO2, HCO3,  in the last 72 hours  Studies/Results: Dg Chest Port 1 View  02/15/2014   CLINICAL DATA:  Preop  EXAM: PORTABLE CHEST - 1 VIEW  COMPARISON:  04/12/2012  FINDINGS: Cardiomegaly is noted. The study is limited by poor inspiration and portable technique. Mild interstitial prominence bilaterally without convincing pulmonary edema. No segmental infiltrate.  IMPRESSION: Limited study by poor inspiration and  portable technique. Mild interstitial prominence bilaterally without convincing pulmonary edema. No segmental infiltrate.   Electronically Signed   By: Lahoma Crocker M.D.   On: 02/15/2014 13:00    Anti-infectives: Anti-infectives   Start     Dose/Rate Route Frequency Ordered Stop   02/15/14 1400  clindamycin (CLEOCIN) IVPB 600 mg     600 mg 100 mL/hr over 30 Minutes Intravenous 3 times per day 02/15/14 1016     02/15/14 0400  clindamycin (CLEOCIN) IVPB 600 mg  Status:  Discontinued    Comments:  Clear dosing with pharmacy in setting of renal failure   600 mg 100 mL/hr over 30 Minutes Intravenous Every 8 hours 02/14/14 2039 02/15/14 1013   02/14/14 2200  clindamycin (CLEOCIN) IVPB 600 mg  Status:  Discontinued    Comments:  Clear dosing with pharmacy in setting of renal failure   600 mg 100 mL/hr over 30 Minutes Intravenous 3 times per day 02/14/14 2004 02/14/14 2038   02/14/14 1830  clindamycin (CLEOCIN) IVPB 600 mg     600 mg 100 mL/hr over 30 Minutes Intravenous  Once 02/14/14 1820 02/14/14 2004   02/14/14 1730  vancomycin (VANCOCIN) 2,000 mg in sodium chloride 0.9 % 500 mL IVPB     2,000 mg 250 mL/hr over 120 Minutes Intravenous  Once 02/14/14 1725 02/14/14 1811  Assessment/Plan: s/p Procedure(s): IRRIGATION AND DRAINAGE OF LOW BACK ABSCESS (Left)  Dressing change painful as expected for first time Will need bid dressing change for next day or two but eventually can have only one per day Boyfriend watched me  F/u cx Will need 1 week of abx (oral ok as outpt) Pt states she wants to go home. Not sure if she is ready today. Probably tomorrow if wbc cont to go down Will need [place] for wound care rec premedicating pt with oral pain med about 30 minutes prior to dressing change, may also need IV pain med just before for the first few dressing changes but eventually will only need oral   Leighton Ruff. Redmond Pulling, MD, FACS General, Bariatric, & Minimally Invasive Surgery [facility], [place]   LOS: 2 days    Gayland Curry 02/16/2014

## 2014-02-16 NOTE — Progress Notes (Signed)
Called to patient's room.  Pt stated "I want to go home.  I would like to talk to the doctor about getting my medicine so I can go home."  Encouraged pt that it is best to stay.  Will notify Dr. Hartford Poli.

## 2014-02-16 NOTE — Progress Notes (Signed)
S: Co pain at wound site O:BP 151/99  Pulse 108  Temp(Src) 98.7 F (37.1 C) (Oral)  Resp 20  Ht 5\' 4"  (1.626 m)  Wt 108.5 kg (239 lb 3.2 oz)  BMI 41.04 kg/m2  SpO2 93%  LMP 01/31/2014  Intake/Output Summary (Last 24 hours) at 02/16/14 0823 Last data filed at 02/16/14 0533  Gross per 24 hour  Intake    501 ml  Output     20 ml  Net    481 ml   Weight change: 1.905 kg (4 lb 3.2 oz) IN:[id number] and alert CVS:RRR Resp:clear Abd:+ BS NTND Ext:no edema.  LUA AVF + bruit NEURO:CNI Ox3 no asterixis Back: Bandage Lt back   . allopurinol  300 mg Oral QHS  . ALPRAZolam  0.5 mg Oral TID  . amLODipine  10 mg Oral Daily  . antiseptic oral rinse  15 mL Mouth Rinse BID  . budesonide-formoterol  2 puff Inhalation BID  . clindamycin (CLEOCIN) IV  600 mg Intravenous 3 times per day  . darbepoetin (ARANESP) injection - DIALYSIS  25 mcg Intravenous Q Sat-HD  . feeding supplement (PRO-STAT SUGAR FREE 64)  30 mL Oral Q1500  . ferric gluconate (FERRLECIT/NULECIT) IV  125 mg Intravenous Q T,Th,Sa-HD  . heparin  5,000 Units Subcutaneous 3 times per day  . lidocaine-prilocaine  1 application Topical Once per day on Mon Wed Fri  . lisinopril  20 mg Oral Daily  . methocarbamol  500 mg Oral BID  . montelukast  10 mg Oral Daily  . multivitamin  1 tablet Oral QHS  . nicotine  21 mg Transdermal Daily  . OxyCODONE  40 mg Oral Q12H  . pantoprazole  80 mg Oral Q1200  . sevelamer carbonate  2.4 g Oral TID WC  . sodium chloride  3 mL Intravenous Q12H  . sodium chloride  3 mL Intravenous Q12H   Dg Chest Port 1 View  02/15/2014   CLINICAL DATA:  Preop  EXAM: PORTABLE CHEST - 1 VIEW  COMPARISON:  04/12/2012  FINDINGS: Cardiomegaly is noted. The study is limited by poor inspiration and portable technique. Mild interstitial prominence bilaterally without convincing pulmonary edema. No segmental infiltrate.  IMPRESSION: Limited study by poor inspiration and portable technique. Mild interstitial prominence  bilaterally without convincing pulmonary edema. No segmental infiltrate.   Electronically Signed   By: Lahoma Crocker M.D.   On: 02/15/2014 13:00   BMET    Component Value Date/Time   NA 140 02/15/2014 0046   K 3.0* 02/15/2014 0046   CL 97 02/15/2014 0046   CO2 24 02/15/2014 0046   GLUCOSE 115* 02/15/2014 0046   BUN 38* 02/15/2014 0046   CREATININE 6.15* 02/15/2014 0046   CALCIUM 9.7 02/15/2014 0046   CALCIUM 9.3 01/03/2012 1109   GFRNONAA 8* 02/15/2014 0046   GFRAA 9* 02/15/2014 0046   CBC    Component Value Date/Time   WBC 18.4* 02/16/2014 0548   RBC 2.95* 02/16/2014 0548   RBC 3.12* 02/14/2014 2025   HGB 9.6* 02/16/2014 0548   HCT 30.4* 02/16/2014 0548   PLT 239 02/16/2014 0548   MCV 103.1* 02/16/2014 0548   MCH 32.5 02/16/2014 0548   MCHC 31.6 02/16/2014 0548   RDW 15.5 02/16/2014 0548   LYMPHSABS 2.6 02/16/2014 0548   MONOABS 1.2* 02/16/2014 0548   EOSABS 0.0 02/16/2014 0548   BASOSABS 0.0 02/16/2014 0548     Assessment: 1.  Back Abscess, Gm pos cocci on Gm stain 2. Anemia 3.  HTN 4. ESRD  Plan: 1. HD today 2. Cont AB pending final culture results   Windy Kalata

## 2014-02-16 NOTE — Progress Notes (Signed)
Dialysis tech arrived to pick up patient for HDU.  Pt stated "I'm not going today."  Family and nursing encouraged pt to go to HDU since pt had not been since Tuesday.  Pt adamantly stated "They told me they were going to take me this morning, they weren't here.  I am not going now."  Asked if pt needs rest and if they could come back later, pt stated "I am not going."  Informed Dialysis tech.  Will continue to monitor pt.

## 2014-02-16 NOTE — Progress Notes (Signed)
[facility] PROGRESS NOTE   [name] [id number] DOB: [birth year] DOA: 02/14/2014 PCP: Enid Skeens., MD  HPI/Subjective: Sitting on the bed, very emotional. Complaining about back pain.  Assessment/Plan: Principal Problem:   Sepsis Active Problems:   HTN (hypertension)   End stage renal disease   Cellulitis    Sepsis -With WBC of 26.5, heart rate of 112 and temp of 99.6 plus cellulitis. -Started initially on vancomycin, reported some hives overnight so was discontinued. -Patient is on clindamycin.  Cellulitis -Cellulitis in the left side of the back of the patient. -Status post I&D done yesterday by general surgery, Gram stain showed GPC in clusters. -Continue clindamycin, likely staph.  ESRD -Patient is on dialysis on Tuesday, Thursday and Saturday. -That she missed last Thursday dialysis secondary to the cellulitis/pain. -Appreciate Dr. Etheleen Nicks help, patient for dialysis today.  Hypertension -Blood pressure elevated, likely pain and volume are contributing. -Suspect after dialysis patient blood pressure will improve.  Code Status: Full code Family Communication: Plan discussed with the patient. Disposition Plan: Remains inpatient   Consultants:  Nephrology.  General surgery  Procedures:  None  Antibiotics:  Clindamycin   Objective: Filed Vitals:   02/16/14 0514  BP: 151/99  Pulse: 108  Temp: 98.7 F (37.1 C)  Resp: 20    Intake/Output Summary (Last 24 hours) at 02/16/14 0921 Last data filed at 02/16/14 0533  Gross per 24 hour  Intake    373 ml  Output     20 ml  Net    353 ml   Filed Weights   02/14/14 2042 02/15/14 0422 02/16/14 0514  Weight: 109.9 kg (242 lb 4.6 oz) 108.4 kg (238 lb 15.7 oz) 108.5 kg (239 lb 3.2 oz)    Exam: General: Alert and awake, oriented x3, not in any acute distress. HEENT: anicteric sclera, pupils reactive to light and accommodation, EOMI CVS: S1-S2 clear, no murmur rubs or  gallops Chest: clear to auscultation bilaterally, no wheezing, rales or rhonchi Abdomen: soft nontender, nondistended, normal bowel sounds, no organomegaly Extremities: no cyanosis, clubbing or edema noted bilaterally Neuro: Cranial nerves II-XII intact, no focal neurological deficits  Data Reviewed: Basic Metabolic Panel:  Recent Labs Lab 02/14/14 1523 02/14/14 1925 02/14/14 2025 02/15/14 0046  NA 139  --   --  140  K 3.1*  --   --  3.0*  CL 93*  --   --  97  CO2 25  --   --  24  GLUCOSE 147*  --   --  115*  BUN 34*  --   --  38*  CREATININE 5.86* 5.84*  --  6.15*  CALCIUM 9.6  --   --  9.7  MG  --   --  1.9  --    Liver Function Tests:  Recent Labs Lab 02/15/14 0046  AST 9  ALT 11  ALKPHOS 72  BILITOT <0.2*  PROT 6.2  ALBUMIN 2.5*   No results found for this basename: LIPASE, AMYLASE,  in the last 168 hours No results found for this basename: AMMONIA,  in the last 168 hours CBC:  Recent Labs Lab 02/14/14 1523 02/14/14 1925 02/15/14 0046 02/16/14 0548  WBC 26.5* 27.8* 24.0* 18.4*  NEUTROABS 23.0*  --  19.9* 14.5*  HGB 10.9* 10.4* 10.2* 9.6*  HCT 33.7* 32.4* 31.9* 30.4*  MCV 102.7* 101.9* 102.2* 103.1*  PLT 235 222 225 239   Cardiac Enzymes: No results found for this basename: CKTOTAL, CKMB, CKMBINDEX, TROPONINI,  in the  last 168 hours BNP (last 3 results) No results found for this basename: PROBNP,  in the last 8760 hours CBG: No results found for this basename: GLUCAP,  in the last 168 hours  Micro Recent Results (from the past 240 hour(s))  ANAEROBIC CULTURE     Status: None   Collection Time    02/15/14  3:19 PM      Result Value Ref Range Status   Specimen Description ABSCESS BACK LEFT   Final   Special Requests PT ON CLINDAMYCIN   Final   Gram Stain     Final   Value: FEW WBC PRESENT, PREDOMINANTLY PMN     NO SQUAMOUS EPITHELIAL CELLS SEEN     FEW GRAM POSITIVE COCCI IN CLUSTERS     Performed at [facility]   Culture PENDING    Incomplete   Report Status PENDING   Incomplete  CULTURE, ROUTINE-ABSCESS     Status: None   Collection Time    02/15/14  3:19 PM      Result Value Ref Range Status   Specimen Description ABSCESS BACK LEFT   Final   Special Requests PT ON CLINDAMYCIN   Final   Gram Stain     Final   Value: FEW WBC PRESENT, PREDOMINANTLY PMN     NO SQUAMOUS EPITHELIAL CELLS SEEN     FEW GRAM POSITIVE COCCI IN CLUSTERS     Performed at [facility]   Culture     Final   Value: Culture reincubated for better growth     Performed at [facility]   Report Status PENDING   Incomplete     Studies: Dg Chest Port 1 View  02/15/2014   CLINICAL DATA:  Preop  EXAM: PORTABLE CHEST - 1 VIEW  COMPARISON:  04/12/2012  FINDINGS: Cardiomegaly is noted. The study is limited by poor inspiration and portable technique. Mild interstitial prominence bilaterally without convincing pulmonary edema. No segmental infiltrate.  IMPRESSION: Limited study by poor inspiration and portable technique. Mild interstitial prominence bilaterally without convincing pulmonary edema. No segmental infiltrate.   Electronically Signed   By: Lahoma Crocker M.D.   On: 02/15/2014 13:00    Scheduled Meds: . allopurinol  300 mg Oral QHS  . ALPRAZolam  0.5 mg Oral TID  . amLODipine  10 mg Oral Daily  . antiseptic oral rinse  15 mL Mouth Rinse BID  . budesonide-formoterol  2 puff Inhalation BID  . clindamycin (CLEOCIN) IV  600 mg Intravenous 3 times per day  . darbepoetin (ARANESP) injection - DIALYSIS  25 mcg Intravenous Q Sat-HD  . feeding supplement (PRO-STAT SUGAR FREE 64)  30 mL Oral Q1500  . ferric gluconate (FERRLECIT/NULECIT) IV  125 mg Intravenous Q T,Th,Sa-HD  . heparin  5,000 Units Subcutaneous 3 times per day  . lidocaine-prilocaine  1 application Topical Once per day on Mon Wed Fri  . lisinopril  20 mg Oral Daily  . methocarbamol  500 mg Oral BID  . montelukast  10 mg Oral Daily  . multivitamin  1 tablet Oral QHS  .  nicotine  21 mg Transdermal Daily  . OxyCODONE  40 mg Oral Q12H  . pantoprazole  80 mg Oral Q1200  . sevelamer carbonate  2.4 g Oral TID WC  . sodium chloride  3 mL Intravenous Q12H  . sodium chloride  3 mL Intravenous Q12H   Continuous Infusions: . sodium chloride 20 mL/hr at 02/15/14 1454  Time spent: 35 minutes    Verlee Monte  [facility] Pager [phone] If 7PM-7AM, please contact night-coverage at [email or web address], password [facility] 02/16/2014, 9:21 AM  LOS: 2 days

## 2014-02-17 DIAGNOSIS — E8779 Other fluid overload: Secondary | ICD-10-CM | POA: Diagnosis not present

## 2014-02-17 DIAGNOSIS — N186 End stage renal disease: Secondary | ICD-10-CM | POA: Diagnosis not present

## 2014-02-17 DIAGNOSIS — L0291 Cutaneous abscess, unspecified: Secondary | ICD-10-CM | POA: Diagnosis not present

## 2014-02-17 DIAGNOSIS — T82598A Other mechanical complication of other cardiac and vascular devices and implants, initial encounter: Secondary | ICD-10-CM

## 2014-02-17 DIAGNOSIS — D649 Anemia, unspecified: Secondary | ICD-10-CM | POA: Diagnosis not present

## 2014-02-17 DIAGNOSIS — I10 Essential (primary) hypertension: Secondary | ICD-10-CM | POA: Diagnosis not present

## 2014-02-17 DIAGNOSIS — A419 Sepsis, unspecified organism: Secondary | ICD-10-CM | POA: Diagnosis not present

## 2014-02-17 DIAGNOSIS — Z9889 Other specified postprocedural states: Secondary | ICD-10-CM

## 2014-02-17 LAB — CBC WITH DIFFERENTIAL/PLATELET
BASOS ABS: 0 10*3/uL (ref 0.0–0.1)
BASOS PCT: 0 % (ref 0–1)
EOS PCT: 0 % (ref 0–5)
Eosinophils Absolute: 0 10*3/uL (ref 0.0–0.7)
HCT: 29.2 % — ABNORMAL LOW (ref 36.0–46.0)
Hemoglobin: 9.1 g/dL — ABNORMAL LOW (ref 12.0–15.0)
Lymphocytes Relative: 17 % (ref 12–46)
Lymphs Abs: 2.5 10*3/uL (ref 0.7–4.0)
MCH: 31.8 pg (ref 26.0–34.0)
MCHC: 31.2 g/dL (ref 30.0–36.0)
MCV: 102.1 fL — AB (ref 78.0–100.0)
MONO ABS: 0.8 10*3/uL (ref 0.1–1.0)
Monocytes Relative: 6 % (ref 3–12)
Neutro Abs: 11.3 10*3/uL — ABNORMAL HIGH (ref 1.7–7.7)
Neutrophils Relative %: 77 % (ref 43–77)
Platelets: 274 10*3/uL (ref 150–400)
RBC: 2.86 MIL/uL — ABNORMAL LOW (ref 3.87–5.11)
RDW: 15.3 % (ref 11.5–15.5)
WBC: 14.6 10*3/uL — ABNORMAL HIGH (ref 4.0–10.5)

## 2014-02-17 MED ORDER — CLINDAMYCIN HCL 300 MG PO CAPS: 300.0000 mg | ORAL_CAPSULE | Freq: Three times a day (TID) | ORAL | Status: DC

## 2014-02-17 MED ORDER — DIPHENHYDRAMINE HCL 50 MG/ML IJ SOLN
25.0000 mg | Freq: Once | INTRAMUSCULAR | Status: DC
  Filled 2014-02-17: qty 1

## 2014-02-17 MED ORDER — DIPHENHYDRAMINE HCL 50 MG/ML IJ SOLN
25.0000 mg | Freq: Once | INTRAMUSCULAR | Status: AC
  Administered 2014-02-17: 25 mg via INTRAVENOUS
  Filled 2014-02-17: qty 1

## 2014-02-17 MED ORDER — HYDROCODONE-ACETAMINOPHEN 5-325 MG PO TABS: 1.0000 | ORAL_TABLET | Freq: Four times a day (QID) | ORAL | Status: DC | PRN

## 2014-02-17 MED ORDER — SACCHAROMYCES BOULARDII 250 MG PO CAPS: 250.0000 mg | ORAL_CAPSULE | Freq: Two times a day (BID) | ORAL | Status: DC

## 2014-02-17 NOTE — Progress Notes (Signed)
Dressing to lower back abscess changed.  Pt given prescriptions and discharge instructions.  PIV removed.  Pt taken to discharge location via wheelchair.

## 2014-02-17 NOTE — Discharge Summary (Signed)
Physician Discharge Summary  [name] [id number] DOB: 05-13-1971 DOA: 02/14/2014  PCP: Enid Skeens., MD  Admit date: 02/14/2014 Discharge date: 02/17/2014  Time spent: 40 minutes  Recommendations for Outpatient Follow-up:  Followup with primary care physician within one week.  Discharge Diagnoses:  Principal Problem:   Sepsis Active Problems:   HTN (hypertension)   End stage renal disease   Cellulitis   Discharge Condition: Stable  Diet recommendation: Heart healthy  Filed Weights   02/15/14 0422 02/16/14 0514 02/17/14 0417  Weight: 108.4 kg (238 lb 15.7 oz) 108.5 kg (239 lb 3.2 oz) 108.818 kg (239 lb 14.4 oz)    History of present illness:  History of Present Illness:This is a 43 y.o. year old female with multiple medical problems including morbid obesity, chronic pain, end-stage renal disease on hemodialysis Tuesday Thursday Saturday, tobacco abuse presenting with left lower back cellulitis. Patient states she has noticed progressive left low back pain redness and swelling over the past 2-3 days. Has also had some worsening fevers and chills at home. Patient denies any known trauma laceration or abrasion to the area. It is unclear if she may have had a spider bite over the affected area. No prior history of diabetes. Patient presented to the ER today because of worsening symptoms. Was due for dialysis today but was unable to go because of worsening pain.  In the ER, MAXIMUM TEMPERATURE of 99.6. Elevated blood pressures ranging in the 160s to 170s over 80s to 100s. White blood cell count 26.5. Creatinine 5.6. Affected area was reportedly ultrasound it at the bedside the ER physician with no formal pockets of abscess. Was initially started on vancomycin but had a rash/allergic reaction to medication. Patient was given Benadryl and to transition to IV clindamycin.  Hospital Course:   Sepsis  -With WBC of 26.5, heart rate of 112 and temp of 99.6 plus cellulitis.   -Started initially on vancomycin, reported some hives overnight so was discontinued.  -Started on IV clindamycin in the emergency department.   Staph aureus Cellulitis  -Cellulitis in the left side of the back of the patient.  -Status post I&D done yesterday by general surgery, abscess is 2x7 cm. -On discharge susceptibility is not back, on clinda and did very well. -Discharge home on oral clindamycin. Wound care nurse and sent to home for dressing changes. -Please note that she had reaction to vancomycin so elected not to give vancomycin with dialysis.  ESRD  -Patient is on dialysis on Tuesday, Thursday and Saturday.  -That she missed last Thursday dialysis secondary to the cellulitis/pain.  -Appreciate Dr. Etheleen Nicks help, patient for dialysis today.   Hypertension  -Blood pressure elevated, likely pain and volume are contributing.  -Suspect after dialysis patient blood pressure will improve.   Non-adherence to medical therapy -Patient was very emotional the hospital, she was crying all the time. -She refused HD  while she is in the hospital because they told her it could be in a.m. and they came in to take her in the afternoon. -She was also very tearful over time if any pain medication delayed even for 10-15 minutes. -Patient was about to leave the hospital AMA, but she stayed when she knew no prescription will be written to her.   Procedures:  Incision and drainage of left sided back abscess in the OR.  Consultations:  General surgery.  Nephrology Discharge Exam: Filed Vitals:   02/17/14 0514  BP: 130/86  Pulse: 102  Temp: 98.2 F (36.8 C)  Resp: 16   General: Alert and awake, oriented x3, not in any acute distress. HEENT: anicteric sclera, pupils reactive to light and accommodation, EOMI CVS: S1-S2 clear, no murmur rubs or gallops Chest: clear to auscultation bilaterally, no wheezing, rales or rhonchi Abdomen: soft nontender, nondistended, normal bowel sounds,  no organomegaly Extremities: no cyanosis, clubbing or edema noted bilaterally Neuro: Cranial nerves II-XII intact, no focal neurological deficits  Discharge Instructions You were cared for by a hospitalist during your hospital stay. If you have any questions about your discharge medications or the care you received while you were in the hospital after you are discharged, you can call the unit and asked to speak with the hospitalist on call if the hospitalist that took care of you is not available. Once you are discharged, your primary care physician will handle any further medical issues. Please note that NO REFILLS for any discharge medications will be authorized once you are discharged, as it is imperative that you return to your primary care physician (or establish a relationship with a primary care physician if you do not have one) for your aftercare needs so that they can reassess your need for medications and monitor your lab values.  Discharge Orders   Future Orders Complete By Expires   Diet - low sodium heart healthy  As directed    Increase activity slowly  As directed        Medication List         acetaminophen 500 MG tablet  Commonly known as:  TYLENOL  Take 1,000 mg by mouth daily as needed. For headache     albuterol 108 (90 BASE) MCG/ACT inhaler  Commonly known as:  PROVENTIL HFA;VENTOLIN HFA  Inhale 2 puffs into the lungs every 6 (six) hours as needed. For shortness of breath     allopurinol 300 MG tablet  Commonly known as:  ZYLOPRIM  Take 300 mg by mouth at bedtime.     ALPRAZolam 0.5 MG tablet  Commonly known as:  XANAX  Take 0.5 mg by mouth 3 (three) times daily.     amLODipine 10 MG tablet  Commonly known as:  NORVASC  Take 10 mg by mouth daily.     budesonide-formoterol 160-4.5 MCG/ACT inhaler  Commonly known as:  SYMBICORT  Inhale 2 puffs into the lungs 2 (two) times daily.     calcium acetate 667 MG capsule  Commonly known as:  PHOSLO  Take 667 mg  by mouth 3 (three) times daily with meals.     clindamycin 300 MG capsule  Commonly known as:  CLEOCIN  Take 1 capsule (300 mg total) by mouth 3 (three) times daily.     cyclobenzaprine 10 MG tablet  Commonly known as:  FLEXERIL  Take 10 mg by mouth 2 (two) times daily as needed for muscle spasms. For muscle spasms     esomeprazole 40 MG capsule  Commonly known as:  NEXIUM  Take 40 mg by mouth daily.     furosemide 80 MG tablet  Commonly known as:  LASIX  Take 80 mg by mouth 2 (two) times daily.     HYDROcodone-acetaminophen 5-325 MG per tablet  Commonly known as:  NORCO/VICODIN  Take 1-2 tablets by mouth every 6 (six) hours as needed for moderate pain.     lidocaine-prilocaine cream  Commonly known as:  EMLA  Apply 1 application topically 3 (three) times a week. For dialysis port.     lisinopril 20 MG tablet  Commonly  known as:  PRINIVIL,ZESTRIL  Take 20 mg by mouth daily.     Melatonin 5 MG Tabs  Take 5 mg by mouth daily.     methocarbamol 500 MG tablet  Commonly known as:  ROBAXIN  Take 500 mg by mouth 2 (two) times daily.     montelukast 10 MG tablet  Commonly known as:  SINGULAIR  Take 10 mg by mouth every morning.     multivitamin Tabs tablet  Take 1 tablet by mouth daily.     oxymorphone 20 MG 12 hr tablet  Commonly known as:  OPANA ER  Take 20 mg by mouth every 12 (twelve) hours.     prochlorperazine 5 MG tablet  Commonly known as:  COMPAZINE  Take 5 mg by mouth daily as needed.     promethazine 25 MG tablet  Commonly known as:  PHENERGAN  Take 25 mg by mouth every 6 (six) hours as needed. For nausea     saccharomyces boulardii 250 MG capsule  Commonly known as:  FLORASTOR  Take 1 capsule (250 mg total) by mouth 2 (two) times daily.     SUMAtriptan 50 MG tablet  Commonly known as:  IMITREX  Take 50 mg by mouth daily as needed. For migraines       Allergies  Allergen Reactions  . Nsaids Other (See Comments)    Renal failure  .  Clarithromycin Nausea And Vomiting  . Demerol Nausea And Vomiting and Other (See Comments)    Severe headaches  . Dilaudid [Hydromorphone Hcl] Nausea And Vomiting and Other (See Comments)    Headache   . Fentanyl Nausea And Vomiting  . Morphine And Related Nausea And Vomiting    Severe headache. p does not want it!  . Other Other (See Comments)    Seeds and nuts- has diverticulitis   . Vancomycin Hives      The results of significant diagnostics from this hospitalization (including imaging, microbiology, ancillary and laboratory) are listed below for reference.    Significant Diagnostic Studies: Dg Chest Port 1 View  02/15/2014   CLINICAL DATA:  Preop  EXAM: PORTABLE CHEST - 1 VIEW  COMPARISON:  04/12/2012  FINDINGS: Cardiomegaly is noted. The study is limited by poor inspiration and portable technique. Mild interstitial prominence bilaterally without convincing pulmonary edema. No segmental infiltrate.  IMPRESSION: Limited study by poor inspiration and portable technique. Mild interstitial prominence bilaterally without convincing pulmonary edema. No segmental infiltrate.   Electronically Signed   By: Lahoma Crocker M.D.   On: 02/15/2014 13:00    Microbiology: Recent Results (from the past 240 hour(s))  ANAEROBIC CULTURE     Status: None   Collection Time    02/15/14  3:19 PM      Result Value Ref Range Status   Specimen Description ABSCESS BACK LEFT   Final   Special Requests PT ON CLINDAMYCIN   Final   Gram Stain     Final   Value: FEW WBC PRESENT, PREDOMINANTLY PMN     NO SQUAMOUS EPITHELIAL CELLS SEEN     FEW GRAM POSITIVE COCCI IN CLUSTERS     Performed at [facility]   Culture     Final   Value: NO ANAEROBES ISOLATED; CULTURE IN PROGRESS FOR 5 DAYS     Performed at [facility]   Report Status PENDING   Incomplete  CULTURE, ROUTINE-ABSCESS     Status: None   Collection Time    02/15/14  3:19 PM  Result Value Ref Range Status   Specimen Description  ABSCESS BACK LEFT   Final   Special Requests PT ON CLINDAMYCIN   Final   Gram Stain     Final   Value: FEW WBC PRESENT, PREDOMINANTLY PMN     NO SQUAMOUS EPITHELIAL CELLS SEEN     FEW GRAM POSITIVE COCCI IN CLUSTERS     Performed at [facility]   Culture     Final   Value: MODERATE STAPHYLOCOCCUS AUREUS     Note: RIFAMPIN AND GENTAMICIN SHOULD NOT BE USED AS SINGLE DRUGS FOR TREATMENT OF STAPH INFECTIONS.     Performed at [facility]   Report Status PENDING   Incomplete     Labs: Basic Metabolic Panel:  Recent Labs Lab 02/14/14 1523 02/14/14 1925 02/14/14 2025 02/15/14 0046  NA 139  --   --  140  K 3.1*  --   --  3.0*  CL 93*  --   --  97  CO2 25  --   --  24  GLUCOSE 147*  --   --  115*  BUN 34*  --   --  38*  CREATININE 5.86* 5.84*  --  6.15*  CALCIUM 9.6  --   --  9.7  MG  --   --  1.9  --    Liver Function Tests:  Recent Labs Lab 02/15/14 0046  AST 9  ALT 11  ALKPHOS 72  BILITOT <0.2*  PROT 6.2  ALBUMIN 2.5*   No results found for this basename: LIPASE, AMYLASE,  in the last 168 hours No results found for this basename: AMMONIA,  in the last 168 hours CBC:  Recent Labs Lab 02/14/14 1523 02/14/14 1925 02/15/14 0046 02/16/14 0548 02/17/14 0718  WBC 26.5* 27.8* 24.0* 18.4* 14.6*  NEUTROABS 23.0*  --  19.9* 14.5* 11.3*  HGB 10.9* 10.4* 10.2* 9.6* 9.1*  HCT 33.7* 32.4* 31.9* 30.4* 29.2*  MCV 102.7* 101.9* 102.2* 103.1* 102.1*  PLT 235 222 225 239 274   Cardiac Enzymes: No results found for this basename: CKTOTAL, CKMB, CKMBINDEX, TROPONINI,  in the last 168 hours BNP: BNP (last 3 results) No results found for this basename: PROBNP,  in the last 8760 hours CBG: No results found for this basename: GLUCAP,  in the last 168 hours     Signed:  Verlee Monte  [facility] 02/17/2014, 9:28 AM

## 2014-02-17 NOTE — Care Management Note (Signed)
    Page 1 of 1   02/17/2014     4:58:21 PM CARE MANAGEMENT NOTE 02/17/2014  Patient:  CALLA, THORNGREN   Account Number:  [id number]  Date Initiated:  02/17/2014  Documentation initiated by:  [facility]  Subjective/Objective Assessment:   adm: SEPSIS; Cellulitis     Action/Plan:   discharge planning   Anticipated DC Date:  02/17/2014   Anticipated DC Plan:  [place]  CM consult      [facility] Choice  HOME HEALTH   Choice offered to / List presented to:  C-1 Patient        [place] arranged  HH-1 RN      [place].   Status of service:  Completed, signed off Medicare Important Message given?   (If response is "NO", the following Medicare IM given date fields will be blank) Date Medicare IM given:   Date Additional Medicare IM given:    Discharge Disposition:  [place]  Per UR Regulation:    If discussed at Long Length of Stay Meetings, dates discussed:    Comments:  02/17/14 16:55 CM spoke with pt concerning choice for [facility]. Pt states [facility] will be fine and her boyfriend will be taught to do dressing changes at home.  Address and contact numbers were verified with pt.  Referral faxed to [facility] for [facility].  No other CM needs were communicated.  Mariane Masters, BSN, CM [phone].

## 2014-02-17 NOTE — Progress Notes (Signed)
Patient refused 2nd dressing change to left back. Dressing reinforced will cont to monitor

## 2014-02-17 NOTE — Progress Notes (Signed)
S: Says she is going home.  Refused HD yest O:BP 130/86  Pulse 102  Temp(Src) 98.2 F (36.8 C) (Oral)  Resp 16  Ht 5\' 4"  (1.626 m)  Wt 108.818 kg (239 lb 14.4 oz)  BMI 41.16 kg/m2  SpO2 94%  LMP 01/31/2014  Intake/Output Summary (Last 24 hours) at 02/17/14 0904 Last data filed at 02/17/14 0601  Gross per 24 hour  Intake    986 ml  Output      0 ml  Net    986 ml   Weight change: 0.318 kg (11.2 oz) IN:[id number] and alert CVS:RRR Resp:clear Abd:+ BS NTND Ext:no edema.  LUA AVF + bruit NEURO:CNI Ox3 no asterixis Back: Bandage Lt back   . allopurinol  300 mg Oral QHS  . ALPRAZolam  0.5 mg Oral TID  . amLODipine  10 mg Oral Daily  . antiseptic oral rinse  15 mL Mouth Rinse BID  . budesonide-formoterol  2 puff Inhalation BID  . clindamycin (CLEOCIN) IV  600 mg Intravenous 3 times per day  . darbepoetin (ARANESP) injection - DIALYSIS  25 mcg Intravenous Q Sat-HD  . diphenhydrAMINE  25 mg Intravenous Once  . feeding supplement (PRO-STAT SUGAR FREE 64)  30 mL Oral Q1500  . ferric gluconate (FERRLECIT/NULECIT) IV  125 mg Intravenous Q T,Th,Sa-HD  . heparin  5,000 Units Subcutaneous 3 times per day  . lidocaine-prilocaine  1 application Topical Once per day on Mon Wed Fri  . lisinopril  20 mg Oral Daily  . methocarbamol  500 mg Oral BID  . montelukast  10 mg Oral Daily  . multivitamin  1 tablet Oral QHS  . nicotine  21 mg Transdermal Daily  . OxyCODONE  40 mg Oral Q12H  . pantoprazole  80 mg Oral Q1200  . sevelamer carbonate  2.4 g Oral TID WC  . sodium chloride  3 mL Intravenous Q12H   Dg Chest Port 1 View  02/15/2014   CLINICAL DATA:  Preop  EXAM: PORTABLE CHEST - 1 VIEW  COMPARISON:  04/12/2012  FINDINGS: Cardiomegaly is noted. The study is limited by poor inspiration and portable technique. Mild interstitial prominence bilaterally without convincing pulmonary edema. No segmental infiltrate.  IMPRESSION: Limited study by poor inspiration and portable technique. Mild  interstitial prominence bilaterally without convincing pulmonary edema. No segmental infiltrate.   Electronically Signed   By: Lahoma Crocker M.D.   On: 02/15/2014 13:00   BMET    Component Value Date/Time   NA 140 02/15/2014 0046   K 3.0* 02/15/2014 0046   CL 97 02/15/2014 0046   CO2 24 02/15/2014 0046   GLUCOSE 115* 02/15/2014 0046   BUN 38* 02/15/2014 0046   CREATININE 6.15* 02/15/2014 0046   CALCIUM 9.7 02/15/2014 0046   CALCIUM 9.3 01/03/2012 1109   GFRNONAA 8* 02/15/2014 0046   GFRAA 9* 02/15/2014 0046   CBC    Component Value Date/Time   WBC 14.6* 02/17/2014 0718   RBC 2.86* 02/17/2014 0718   RBC 3.12* 02/14/2014 2025   HGB 9.1* 02/17/2014 0718   HCT 29.2* 02/17/2014 0718   PLT 274 02/17/2014 0718   MCV 102.1* 02/17/2014 0718   MCH 31.8 02/17/2014 0718   MCHC 31.2 02/17/2014 0718   RDW 15.3 02/17/2014 0718   LYMPHSABS 2.5 02/17/2014 0718   MONOABS 0.8 02/17/2014 0718   EOSABS 0.0 02/17/2014 0718   BASOSABS 0.0 02/17/2014 0718     Assessment: 1.  Back Abscess, Staph 2. Anemia 3. HTN 4. ESRD  Plan: 1. If she goes today she says she will call the kidney center in AM and get a spot for HD 2. Since she is allergic to vanco, I am limited in what I can give her IV at the kidney center.  If Staph sens to ancef I could give that or she could simply take oral AB.  Windy Kalata

## 2014-02-17 NOTE — Progress Notes (Signed)
2 Days Post-Op  Subjective: Ready to go home. Sore and tender in area.   Objective: Vital signs in last 24 hours: Temp:  [97.6 F (36.4 C)-99.1 F (37.3 C)] 98.2 F (36.8 C) (04/19 0514) Pulse Rate:  [102-109] 102 (04/19 0514) Resp:  [16-20] 16 (04/19 0514) BP: (130-143)/(86-90) 130/86 mmHg (04/19 0514) SpO2:  [92 %-95 %] 94 % (04/19 0806) Weight:  [239 lb 14.4 oz (108.818 kg)] 239 lb 14.4 oz (108.818 kg) (04/19 0417) Last BM Date: 02/13/14  Intake/Output from previous day: 04/18 0701 - 04/19 0700 In: 986 [P.O.:786; IV Piggyback:200] Out: -  Intake/Output this shift: Total I/O In: 240 [P.O.:240] Out: -   Alert, nad More pleasant today cta b/l Reg Obese, soft, nt Dressing - intact  Lab Results:   Recent Labs  02/16/14 0548 02/17/14 0718  WBC 18.4* 14.6*  HGB 9.6* 9.1*  HCT 30.4* 29.2*  PLT 239 274   BMET  Recent Labs  02/14/14 1523 02/14/14 1925 02/15/14 0046  NA 139  --  140  K 3.1*  --  3.0*  CL 93*  --  97  CO2 25  --  24  GLUCOSE 147*  --  115*  BUN 34*  --  38*  CREATININE 5.86* 5.84* 6.15*  CALCIUM 9.6  --  9.7   PT/INR No results found for this basename: LABPROT, INR,  in the last 72 hours ABG No results found for this basename: PHART, PCO2, PO2, HCO3,  in the last 72 hours  Studies/Results: Dg Chest Port 1 View  02/15/2014   CLINICAL DATA:  Preop  EXAM: PORTABLE CHEST - 1 VIEW  COMPARISON:  04/12/2012  FINDINGS: Cardiomegaly is noted. The study is limited by poor inspiration and portable technique. Mild interstitial prominence bilaterally without convincing pulmonary edema. No segmental infiltrate.  IMPRESSION: Limited study by poor inspiration and portable technique. Mild interstitial prominence bilaterally without convincing pulmonary edema. No segmental infiltrate.   Electronically Signed   By: Lahoma Crocker M.D.   On: 02/15/2014 13:00    Anti-infectives: Anti-infectives   Start     Dose/Rate Route Frequency Ordered Stop   02/17/14 0000   clindamycin (CLEOCIN) 300 MG capsule     300 mg Oral 3 times daily 02/17/14 0925     02/15/14 1400  clindamycin (CLEOCIN) IVPB 600 mg     600 mg 100 mL/hr over 30 Minutes Intravenous 3 times per day 02/15/14 1016     02/15/14 0400  clindamycin (CLEOCIN) IVPB 600 mg  Status:  Discontinued    Comments:  Clear dosing with pharmacy in setting of renal failure   600 mg 100 mL/hr over 30 Minutes Intravenous Every 8 hours 02/14/14 2039 02/15/14 1013   02/14/14 2200  clindamycin (CLEOCIN) IVPB 600 mg  Status:  Discontinued    Comments:  Clear dosing with pharmacy in setting of renal failure   600 mg 100 mL/hr over 30 Minutes Intravenous 3 times per day 02/14/14 2004 02/14/14 2038   02/14/14 1830  clindamycin (CLEOCIN) IVPB 600 mg     600 mg 100 mL/hr over 30 Minutes Intravenous  Once 02/14/14 1820 02/14/14 2004   02/14/14 1730  vancomycin (VANCOCIN) 2,000 mg in sodium chloride 0.9 % 500 mL IVPB     2,000 mg 250 mL/hr over 120 Minutes Intravenous  Once 02/14/14 1725 02/14/14 1811      Assessment/Plan: s/p Procedure(s): IRRIGATION AND DRAINAGE OF LOW BACK ABSCESS (Left)  Wbc trending down. cx growing Staph aureus Dressing change  to be done by nurses [facility] with discharge Will need outpt abx Will need home health nursing for dressing change at least once a day Can f/u with Dr Georgette [facility] at [facility] in 1-2 weeks for wound check  Wound care instructions: Pt may need to take pain pill 30 minutes prior to dressing change Remove tape and gauze Pack larger wound with moistened kerlix to skin, pack smaller wound with a moistened packing strip or small moist piece of gauze; cover both with paper tape; change daily  Wound can get wet in shower but no soaking in tub; [place], [place] or [place]. Redmond Pulling, MD, FACS General, Bariatric, & Minimally Invasive Surgery [facility], [place]   LOS: 3 days    Gayland Curry 02/17/2014

## 2014-02-18 ENCOUNTER — Telehealth (INDEPENDENT_AMBULATORY_CARE_PROVIDER_SITE_OTHER): Payer: Self-pay

## 2014-02-18 ENCOUNTER — Encounter (HOSPITAL_COMMUNITY): Payer: Self-pay | Admitting: Surgery

## 2014-02-18 ENCOUNTER — Telehealth (INDEPENDENT_AMBULATORY_CARE_PROVIDER_SITE_OTHER): Payer: Self-pay | Admitting: Surgery

## 2014-02-18 DIAGNOSIS — Z992 Dependence on renal dialysis: Secondary | ICD-10-CM | POA: Diagnosis not present

## 2014-02-18 DIAGNOSIS — D649 Anemia, unspecified: Secondary | ICD-10-CM | POA: Diagnosis not present

## 2014-02-18 DIAGNOSIS — L03319 Cellulitis of trunk, unspecified: Secondary | ICD-10-CM | POA: Diagnosis not present

## 2014-02-18 DIAGNOSIS — J45909 Unspecified asthma, uncomplicated: Secondary | ICD-10-CM | POA: Diagnosis not present

## 2014-02-18 DIAGNOSIS — G8929 Other chronic pain: Secondary | ICD-10-CM | POA: Diagnosis not present

## 2014-02-18 DIAGNOSIS — M159 Polyosteoarthritis, unspecified: Secondary | ICD-10-CM | POA: Diagnosis not present

## 2014-02-18 DIAGNOSIS — B958 Unspecified staphylococcus as the cause of diseases classified elsewhere: Secondary | ICD-10-CM | POA: Diagnosis not present

## 2014-02-18 DIAGNOSIS — I120 Hypertensive chronic kidney disease with stage 5 chronic kidney disease or end stage renal disease: Secondary | ICD-10-CM | POA: Diagnosis not present

## 2014-02-18 DIAGNOSIS — N186 End stage renal disease: Secondary | ICD-10-CM | POA: Diagnosis not present

## 2014-02-18 DIAGNOSIS — F329 Major depressive disorder, single episode, unspecified: Secondary | ICD-10-CM | POA: Diagnosis not present

## 2014-02-18 DIAGNOSIS — F3289 Other specified depressive episodes: Secondary | ICD-10-CM | POA: Diagnosis not present

## 2014-02-18 DIAGNOSIS — E669 Obesity, unspecified: Secondary | ICD-10-CM | POA: Diagnosis not present

## 2014-02-18 DIAGNOSIS — L02219 Cutaneous abscess of trunk, unspecified: Secondary | ICD-10-CM | POA: Diagnosis not present

## 2014-02-18 LAB — CULTURE, ROUTINE-ABSCESS

## 2014-02-18 NOTE — Telephone Encounter (Signed)
[name]  [id number]  Patient Care Team: Enid Skeens, MD as PCP - General (Family Medicine) Geoffry Paradise, MD (Nephrology)  This patient is a 43 y.o.female who calls today for surgical evaluation.   Date of procedure/visit: 02/15/2014  Surgery: Procedure - incision and drainage of left back abscess (skin, subcutaneous tissue - 15 cm2)  Surgeon: Imogene [facility]. Tsuei   Specimen Description ABSCESS BACK LEFT    Special Requests PT ON CLINDAMYCIN    Gram Stain FEW WBC PRESENT, PREDOMINANTLY PMN NO SQUAMOUS EPITHELIAL CELLS SEEN FEW GRAM POSITIVE COCCI IN CLUSTERS Performed at [facility]   Culture MODERATE STAPHYLOCOCCUS AUREUS Note: RIFAMPIN AND GENTAMICIN SHOULD NOT BE USED AS SINGLE DRUGS FOR TREATMENT OF STAPH INFECTIONS. Performed at [facility]   Report Status 02/18/2014 FINAL    Organism ID, Bacteria STAPHYLOCOCCUS AUREUS    Resulting Agency [facility]   Culture & Susceptibility    Antibiotic  Organism Organism Organism     STAPHYLOCOCCUS AUREUS     CLINDAMYCIN  <=0.25 SENSITIVE S Final       ERYTHROMYCIN  <=0.25 SENSITIVE S Final       GENTAMICIN  <=0.5 SENSITIVE S Final       LEVOFLOXACIN  4 INTERMEDIATE I Final       MOXIFLOXACIN  2 RESISTANT R Final       OXACILLIN  <=0.25 SENSITIVE S Final       PENICILLIN  0.06 SENSITIVE S Final       RIFAMPIN  <=0.5 SENSITIVE S Final       TETRACYCLINE  <=1 SENSITIVE S Final       TRIMETH/SULFA  <=10 SENSITIVE S Final       VANCOMYCIN  1 SENSITIVE S Final         Reason for call: Request for antibiotic  The patient called upset.  She developed a rash to clindamycin.  Has a history of numerous allergies.  At incision and drainage of back abscess last week.  Call.  Her surgeon noted it would be okay to stop antibiotics.  Voice mail message sent.  She does not recall getting it.  She is convinced "you don't care."  I went over her hospital stay in detail to the chart.  I discussed  recommendations of holding off on antibiotics given her numerous allergies.  I recommended close followup in the clinic.  I see efforts are being made to try and get an appointment set up.  She did not know a specific clinic date.  We will have the office call her in the morning when it is open to double check her status and make sure she has a followup appointment.  Patient Active Problem List   Diagnosis Date Noted  . Sepsis 02/15/2014  . Cellulitis 02/14/2014  . Thinning of skin 04/09/2013  . Mechanical complication of other vascular device, implant, and graft 04/09/2013  . Pain, limb, left 10/18/2012  . Swollen 10/18/2012  . Status post endometrial ablation 12/10/2011  . End stage renal disease 11/24/2011  . Renal failure (ARF), acute on chronic 10/19/2011  . HTN (hypertension) 10/19/2011  . Anemia 10/19/2011    Past Medical History  Diagnosis Date  . Anemia   . Depression   . Reflux   . Dizziness   . Anxiety   . ADD (attention deficit disorder)   . Asthma   . Headache(784.0)   . Gout   . Anemia   . GERD (gastroesophageal reflux disease)   .  Obesity   . Gastritis   . Blood transfusion 06/2011    6 units transfused at [facility]  . Arthritis     knee, wrist, back   . Bronchitis   . Shortness of breath   . Sleep apnea     does not use CPAP  . Neuromuscular disorder     carpal tunnel  . Hypertension     sees Dr. Wendie Agreste  . Chronic kidney disease     Atrophic left kidney, sees Dr. Hassell Done  . Renal insufficiency     Past Surgical History  Procedure Laterality Date  . Cesarean section    . Spine surgery  2004    Lumbar diskectomy   . Microdiskectomy  04/2001    Herniated disk L5-S1  . Renal biopsy  04/2004  . Wisdom tooth extraction    . Carpel tunnel release      x 2; bilateral  . Upper gastrointestinal endoscopy    . Colonscopy    . Dilation and curettage of uterus    . Av fistula surgery  11/30/11    [facility] - redo fistula  . Hysteroscopy with  thermachoice  12/10/2011    Procedure: HYSTEROSCOPY WITH THERMACHOICE;  Surgeon: Betsy Coder, MD;  Location: [facility];  Service: Gynecology;  Laterality: N/A;  . Shuntogram  Dec. 18, 2013    Left arm  . Av fistula placement  06/10/11    Left brachiocephalic AVF  . Irrigation and debridement abscess Left 02/15/2014    Procedure: IRRIGATION AND DRAINAGE OF LOW BACK ABSCESS;  Surgeon: Imogene Burn. Georgette Dover, MD;  Location: [facility];  Service: General;  Laterality: Left;    History   Social History  . Marital Status: Divorced    Spouse Name: N/A    Number of Children: N/A  . Years of Education: N/A   Occupational History  . Not on file.   Social History Main Topics  . Smoking status: Current Every Day Smoker -- 1.00 packs/day for 10 years    Types: Cigarettes  . Smokeless tobacco: Never Used  . Alcohol Use: Yes     Comment: ocassional  . Drug Use: No  . Sexual Activity: Not Currently    Birth Control/ Protection: Condom   Other Topics Concern  . Not on file   Social History Narrative  . No narrative on file    Family History  Problem Relation Age of Onset  . Hypertension Mother   . Heart disease Father     Heart Disease before age 51  . Hypertension Father   . Heart attack Father   . Hypertension Daughter     Current Outpatient Prescriptions  Medication Sig Dispense Refill  . acetaminophen (TYLENOL) 500 MG tablet Take 1,000 mg by mouth daily as needed. For headache      . albuterol (PROVENTIL HFA;VENTOLIN HFA) 108 (90 BASE) MCG/ACT inhaler Inhale 2 puffs into the lungs every 6 (six) hours as needed. For shortness of breath       . allopurinol (ZYLOPRIM) 300 MG tablet Take 300 mg by mouth at bedtime.       . ALPRAZolam (XANAX) 0.5 MG tablet Take 0.5 mg by mouth 3 (three) times daily.       Marland Kitchen amLODipine (NORVASC) 10 MG tablet Take 10 mg by mouth daily.      . budesonide-formoterol (SYMBICORT) 160-4.5 MCG/ACT inhaler Inhale 2 puffs into the lungs 2 (two) times daily.      .  calcium acetate (  PHOSLO) 667 MG capsule Take 667 mg by mouth 3 (three) times daily with meals.      . clindamycin (CLEOCIN) 300 MG capsule Take 1 capsule (300 mg total) by mouth 3 (three) times daily.  30 capsule  0  . cyclobenzaprine (FLEXERIL) 10 MG tablet Take 10 mg by mouth 2 (two) times daily as needed for muscle spasms. For muscle spasms      . esomeprazole (NEXIUM) 40 MG capsule Take 40 mg by mouth daily.       . furosemide (LASIX) 80 MG tablet Take 80 mg by mouth 2 (two) times daily.       Marland Kitchen HYDROcodone-acetaminophen (NORCO/VICODIN) 5-325 MG per tablet Take 1-2 tablets by mouth every 6 (six) hours as needed for moderate pain.  20 tablet  0  . lidocaine-prilocaine (EMLA) cream Apply 1 application topically 3 (three) times a week. For dialysis port.      Marland Kitchen lisinopril (PRINIVIL,ZESTRIL) 20 MG tablet Take 20 mg by mouth daily.       . Melatonin 5 MG TABS Take 5 mg by mouth daily.      . methocarbamol (ROBAXIN) 500 MG tablet Take 500 mg by mouth 2 (two) times daily.       . montelukast (SINGULAIR) 10 MG tablet Take 10 mg by mouth every morning.       . multivitamin (RENA-VIT) TABS tablet Take 1 tablet by mouth daily.      Marland Kitchen oxymorphone (OPANA ER) 20 MG 12 hr tablet Take 20 mg by mouth every 12 (twelve) hours.      . prochlorperazine (COMPAZINE) 5 MG tablet Take 5 mg by mouth daily as needed.      . promethazine (PHENERGAN) 25 MG tablet Take 25 mg by mouth every 6 (six) hours as needed. For nausea      . saccharomyces boulardii (FLORASTOR) 250 MG capsule Take 1 capsule (250 mg total) by mouth 2 (two) times daily.  30 capsule  0  . SUMAtriptan (IMITREX) 50 MG tablet Take 50 mg by mouth daily as needed. For migraines       No current facility-administered medications for this visit.   Facility-Administered Medications Ordered in Other Visits  Medication Dose Route Frequency Provider Last Rate Last Dose  . ondansetron (ZOFRAN) injection   Intravenous Anesthesia Intra-op Rogers Blocker, CRNA    4 mg at 02/15/14 1626     Allergies  Allergen Reactions  . Nsaids Other (See Comments)    Renal failure  . Clarithromycin Nausea And Vomiting  . Demerol Nausea And Vomiting and Other (See Comments)    Severe headaches  . Dilaudid [Hydromorphone Hcl] Nausea And Vomiting and Other (See Comments)    Headache   . Fentanyl Nausea And Vomiting  . Morphine And Related Nausea And Vomiting    Severe headache. p does not want it!  . Other Other (See Comments)    Seeds and nuts- has diverticulitis   . Vancomycin Hives    @VS @  Dg Chest Port 1 View  02/15/2014   CLINICAL DATA:  Preop  EXAM: PORTABLE CHEST - 1 VIEW  COMPARISON:  04/12/2012  FINDINGS: Cardiomegaly is noted. The study is limited by poor inspiration and portable technique. Mild interstitial prominence bilaterally without convincing pulmonary edema. No segmental infiltrate.  IMPRESSION: Limited study by poor inspiration and portable technique. Mild interstitial prominence bilaterally without convincing pulmonary edema. No segmental infiltrate.   Electronically Signed   By: Lahoma Crocker M.D.   On: 02/15/2014  13:00    Note: This dictation was prepared with Dragon/digital dictation along with [employer]. Any transcriptional errors that result from this process are unintentional.

## 2014-02-18 NOTE — Telephone Encounter (Signed)
Pt is s/p I&D of a back abscess on 4/17 and [facility] nurse calling stating Clindamycin is making the patient itch.  Advised her to stop this medication.  She also requested a stronger pain medication.  Dr. Georgette Dover said ok to d/c antibiotic with no substitution, and denied any change in pain medication.  LM on pt's VM.

## 2014-02-19 ENCOUNTER — Telehealth (INDEPENDENT_AMBULATORY_CARE_PROVIDER_SITE_OTHER): Payer: Self-pay | Admitting: General Surgery

## 2014-02-19 DIAGNOSIS — L02219 Cutaneous abscess of trunk, unspecified: Secondary | ICD-10-CM | POA: Diagnosis not present

## 2014-02-19 DIAGNOSIS — D631 Anemia in chronic kidney disease: Secondary | ICD-10-CM | POA: Diagnosis not present

## 2014-02-19 DIAGNOSIS — B958 Unspecified staphylococcus as the cause of diseases classified elsewhere: Secondary | ICD-10-CM | POA: Diagnosis not present

## 2014-02-19 DIAGNOSIS — N186 End stage renal disease: Secondary | ICD-10-CM | POA: Diagnosis not present

## 2014-02-19 DIAGNOSIS — M159 Polyosteoarthritis, unspecified: Secondary | ICD-10-CM | POA: Diagnosis not present

## 2014-02-19 DIAGNOSIS — D509 Iron deficiency anemia, unspecified: Secondary | ICD-10-CM | POA: Diagnosis not present

## 2014-02-19 DIAGNOSIS — L03319 Cellulitis of trunk, unspecified: Secondary | ICD-10-CM | POA: Diagnosis not present

## 2014-02-19 DIAGNOSIS — I120 Hypertensive chronic kidney disease with stage 5 chronic kidney disease or end stage renal disease: Secondary | ICD-10-CM | POA: Diagnosis not present

## 2014-02-19 DIAGNOSIS — J45909 Unspecified asthma, uncomplicated: Secondary | ICD-10-CM | POA: Diagnosis not present

## 2014-02-19 NOTE — Telephone Encounter (Signed)
Called the patient this morning to check on her to see how she is doing and she stated that she is feeling a little bit better. I told her that Dr Johney Maine wanted me to call her this morning and check on her and she did not need anymore antibodies. And patient stated that was fine. And she need to make an follow up apt with Dr Georgette Dover and i made her one on 03-04-14 to come in to see Dr Georgette Dover

## 2014-02-20 LAB — ANAEROBIC CULTURE

## 2014-02-20 NOTE — Anesthesia Postprocedure Evaluation (Signed)
Anesthesia Post Note  Patient: [name]  Procedure(s) Performed: Procedure(s) (LRB): IRRIGATION AND DRAINAGE OF LOW BACK ABSCESS (Left)  Anesthesia type: General  Patient location: PACU  Post pain: Pain level controlled and Adequate analgesia  Post assessment: Post-op Vital signs reviewed, Patient's Cardiovascular Status Stable, Respiratory Function Stable, Patent Airway and Pain level controlled  Last Vitals:  Filed Vitals:   02/17/14 1429  BP: 154/96  Pulse: 104  Temp: 36.7 C  Resp: 18    Post vital signs: Reviewed and stable  Level of consciousness: awake, alert  and oriented  Complications: No apparent anesthesia complications

## 2014-02-21 DIAGNOSIS — J45909 Unspecified asthma, uncomplicated: Secondary | ICD-10-CM | POA: Diagnosis not present

## 2014-02-21 DIAGNOSIS — M159 Polyosteoarthritis, unspecified: Secondary | ICD-10-CM | POA: Diagnosis not present

## 2014-02-21 DIAGNOSIS — D631 Anemia in chronic kidney disease: Secondary | ICD-10-CM | POA: Diagnosis not present

## 2014-02-21 DIAGNOSIS — L02219 Cutaneous abscess of trunk, unspecified: Secondary | ICD-10-CM | POA: Diagnosis not present

## 2014-02-21 DIAGNOSIS — N186 End stage renal disease: Secondary | ICD-10-CM | POA: Diagnosis not present

## 2014-02-21 DIAGNOSIS — B958 Unspecified staphylococcus as the cause of diseases classified elsewhere: Secondary | ICD-10-CM | POA: Diagnosis not present

## 2014-02-21 DIAGNOSIS — D509 Iron deficiency anemia, unspecified: Secondary | ICD-10-CM | POA: Diagnosis not present

## 2014-02-21 DIAGNOSIS — L03319 Cellulitis of trunk, unspecified: Secondary | ICD-10-CM | POA: Diagnosis not present

## 2014-02-21 DIAGNOSIS — I120 Hypertensive chronic kidney disease with stage 5 chronic kidney disease or end stage renal disease: Secondary | ICD-10-CM | POA: Diagnosis not present

## 2014-02-22 NOTE — ED Provider Notes (Signed)
Medical screening examination/treatment/procedure(s) were conducted as a shared visit with non-physician practitioner(s) and myself.  I personally evaluated the patient during the encounter.   EKG Interpretation None     Patient with what clinically is cellulitis to her left back/flank. Large area of induration with several punctate areas that are not actively draining. No fluctuance appreciated. Significant leukocytosis. Tachycardia. abx for cellulitis. Admit.   Virgel Manifold, MD 02/22/14 [phone]

## 2014-02-25 ENCOUNTER — Encounter (INDEPENDENT_AMBULATORY_CARE_PROVIDER_SITE_OTHER): Payer: Medicare Other | Admitting: Surgery

## 2014-02-26 DIAGNOSIS — N186 End stage renal disease: Secondary | ICD-10-CM | POA: Diagnosis not present

## 2014-02-26 DIAGNOSIS — D509 Iron deficiency anemia, unspecified: Secondary | ICD-10-CM | POA: Diagnosis not present

## 2014-02-26 DIAGNOSIS — D631 Anemia in chronic kidney disease: Secondary | ICD-10-CM | POA: Diagnosis not present

## 2014-02-27 DIAGNOSIS — L02219 Cutaneous abscess of trunk, unspecified: Secondary | ICD-10-CM | POA: Diagnosis not present

## 2014-02-27 DIAGNOSIS — M159 Polyosteoarthritis, unspecified: Secondary | ICD-10-CM | POA: Diagnosis not present

## 2014-02-27 DIAGNOSIS — I120 Hypertensive chronic kidney disease with stage 5 chronic kidney disease or end stage renal disease: Secondary | ICD-10-CM | POA: Diagnosis not present

## 2014-02-27 DIAGNOSIS — B958 Unspecified staphylococcus as the cause of diseases classified elsewhere: Secondary | ICD-10-CM | POA: Diagnosis not present

## 2014-02-27 DIAGNOSIS — L03319 Cellulitis of trunk, unspecified: Secondary | ICD-10-CM | POA: Diagnosis not present

## 2014-02-27 DIAGNOSIS — J45909 Unspecified asthma, uncomplicated: Secondary | ICD-10-CM | POA: Diagnosis not present

## 2014-02-27 DIAGNOSIS — N186 End stage renal disease: Secondary | ICD-10-CM | POA: Diagnosis not present

## 2014-02-28 DIAGNOSIS — N186 End stage renal disease: Secondary | ICD-10-CM | POA: Diagnosis not present

## 2014-03-01 DIAGNOSIS — I120 Hypertensive chronic kidney disease with stage 5 chronic kidney disease or end stage renal disease: Secondary | ICD-10-CM | POA: Diagnosis not present

## 2014-03-01 DIAGNOSIS — J45909 Unspecified asthma, uncomplicated: Secondary | ICD-10-CM | POA: Diagnosis not present

## 2014-03-01 DIAGNOSIS — B958 Unspecified staphylococcus as the cause of diseases classified elsewhere: Secondary | ICD-10-CM | POA: Diagnosis not present

## 2014-03-01 DIAGNOSIS — L02219 Cutaneous abscess of trunk, unspecified: Secondary | ICD-10-CM | POA: Diagnosis not present

## 2014-03-01 DIAGNOSIS — N186 End stage renal disease: Secondary | ICD-10-CM | POA: Diagnosis not present

## 2014-03-01 DIAGNOSIS — M159 Polyosteoarthritis, unspecified: Secondary | ICD-10-CM | POA: Diagnosis not present

## 2014-03-04 ENCOUNTER — Encounter (INDEPENDENT_AMBULATORY_CARE_PROVIDER_SITE_OTHER): Payer: Medicare Other | Admitting: Surgery

## 2014-03-07 DIAGNOSIS — N039 Chronic nephritic syndrome with unspecified morphologic changes: Secondary | ICD-10-CM | POA: Diagnosis not present

## 2014-03-07 DIAGNOSIS — N186 End stage renal disease: Secondary | ICD-10-CM | POA: Diagnosis not present

## 2014-03-07 DIAGNOSIS — D631 Anemia in chronic kidney disease: Secondary | ICD-10-CM | POA: Diagnosis not present

## 2014-03-08 DIAGNOSIS — B958 Unspecified staphylococcus as the cause of diseases classified elsewhere: Secondary | ICD-10-CM | POA: Diagnosis not present

## 2014-03-08 DIAGNOSIS — I120 Hypertensive chronic kidney disease with stage 5 chronic kidney disease or end stage renal disease: Secondary | ICD-10-CM | POA: Diagnosis not present

## 2014-03-08 DIAGNOSIS — J45909 Unspecified asthma, uncomplicated: Secondary | ICD-10-CM | POA: Diagnosis not present

## 2014-03-08 DIAGNOSIS — M159 Polyosteoarthritis, unspecified: Secondary | ICD-10-CM | POA: Diagnosis not present

## 2014-03-08 DIAGNOSIS — N186 End stage renal disease: Secondary | ICD-10-CM | POA: Diagnosis not present

## 2014-03-08 DIAGNOSIS — L02219 Cutaneous abscess of trunk, unspecified: Secondary | ICD-10-CM | POA: Diagnosis not present

## 2014-03-12 DIAGNOSIS — N039 Chronic nephritic syndrome with unspecified morphologic changes: Secondary | ICD-10-CM | POA: Diagnosis not present

## 2014-03-12 DIAGNOSIS — N186 End stage renal disease: Secondary | ICD-10-CM | POA: Diagnosis not present

## 2014-03-12 DIAGNOSIS — D631 Anemia in chronic kidney disease: Secondary | ICD-10-CM | POA: Diagnosis not present

## 2014-03-13 DIAGNOSIS — G894 Chronic pain syndrome: Secondary | ICD-10-CM | POA: Diagnosis not present

## 2014-03-13 DIAGNOSIS — G43819 Other migraine, intractable, without status migrainosus: Secondary | ICD-10-CM | POA: Diagnosis not present

## 2014-03-13 DIAGNOSIS — IMO0002 Reserved for concepts with insufficient information to code with codable children: Secondary | ICD-10-CM | POA: Diagnosis not present

## 2014-03-13 DIAGNOSIS — M5137 Other intervertebral disc degeneration, lumbosacral region: Secondary | ICD-10-CM | POA: Diagnosis not present

## 2014-03-14 DIAGNOSIS — N186 End stage renal disease: Secondary | ICD-10-CM | POA: Diagnosis not present

## 2014-03-14 DIAGNOSIS — N039 Chronic nephritic syndrome with unspecified morphologic changes: Secondary | ICD-10-CM | POA: Diagnosis not present

## 2014-03-14 DIAGNOSIS — D631 Anemia in chronic kidney disease: Secondary | ICD-10-CM | POA: Diagnosis not present

## 2014-03-15 DIAGNOSIS — B958 Unspecified staphylococcus as the cause of diseases classified elsewhere: Secondary | ICD-10-CM | POA: Diagnosis not present

## 2014-03-15 DIAGNOSIS — J45909 Unspecified asthma, uncomplicated: Secondary | ICD-10-CM | POA: Diagnosis not present

## 2014-03-15 DIAGNOSIS — M159 Polyosteoarthritis, unspecified: Secondary | ICD-10-CM | POA: Diagnosis not present

## 2014-03-15 DIAGNOSIS — I120 Hypertensive chronic kidney disease with stage 5 chronic kidney disease or end stage renal disease: Secondary | ICD-10-CM | POA: Diagnosis not present

## 2014-03-15 DIAGNOSIS — L02219 Cutaneous abscess of trunk, unspecified: Secondary | ICD-10-CM | POA: Diagnosis not present

## 2014-03-15 DIAGNOSIS — N186 End stage renal disease: Secondary | ICD-10-CM | POA: Diagnosis not present

## 2014-03-21 DIAGNOSIS — B958 Unspecified staphylococcus as the cause of diseases classified elsewhere: Secondary | ICD-10-CM | POA: Diagnosis not present

## 2014-03-21 DIAGNOSIS — I120 Hypertensive chronic kidney disease with stage 5 chronic kidney disease or end stage renal disease: Secondary | ICD-10-CM | POA: Diagnosis not present

## 2014-03-21 DIAGNOSIS — M159 Polyosteoarthritis, unspecified: Secondary | ICD-10-CM | POA: Diagnosis not present

## 2014-03-21 DIAGNOSIS — L02219 Cutaneous abscess of trunk, unspecified: Secondary | ICD-10-CM | POA: Diagnosis not present

## 2014-03-21 DIAGNOSIS — J45909 Unspecified asthma, uncomplicated: Secondary | ICD-10-CM | POA: Diagnosis not present

## 2014-03-21 DIAGNOSIS — N186 End stage renal disease: Secondary | ICD-10-CM | POA: Diagnosis not present

## 2014-03-26 ENCOUNTER — Encounter (INDEPENDENT_AMBULATORY_CARE_PROVIDER_SITE_OTHER): Payer: Medicare Other | Admitting: Surgery

## 2014-03-27 DIAGNOSIS — D631 Anemia in chronic kidney disease: Secondary | ICD-10-CM | POA: Diagnosis not present

## 2014-03-27 DIAGNOSIS — N186 End stage renal disease: Secondary | ICD-10-CM | POA: Diagnosis not present

## 2014-03-27 DIAGNOSIS — N039 Chronic nephritic syndrome with unspecified morphologic changes: Secondary | ICD-10-CM | POA: Diagnosis not present

## 2014-03-28 DIAGNOSIS — D631 Anemia in chronic kidney disease: Secondary | ICD-10-CM | POA: Diagnosis not present

## 2014-03-28 DIAGNOSIS — N186 End stage renal disease: Secondary | ICD-10-CM | POA: Diagnosis not present

## 2014-03-31 DIAGNOSIS — N186 End stage renal disease: Secondary | ICD-10-CM | POA: Diagnosis not present

## 2014-04-18 DIAGNOSIS — D631 Anemia in chronic kidney disease: Secondary | ICD-10-CM | POA: Diagnosis not present

## 2014-04-18 DIAGNOSIS — N039 Chronic nephritic syndrome with unspecified morphologic changes: Secondary | ICD-10-CM | POA: Diagnosis not present

## 2014-04-18 DIAGNOSIS — N186 End stage renal disease: Secondary | ICD-10-CM | POA: Diagnosis not present

## 2014-04-24 DIAGNOSIS — D631 Anemia in chronic kidney disease: Secondary | ICD-10-CM | POA: Diagnosis not present

## 2014-04-24 DIAGNOSIS — N186 End stage renal disease: Secondary | ICD-10-CM | POA: Diagnosis not present

## 2014-04-25 DIAGNOSIS — D631 Anemia in chronic kidney disease: Secondary | ICD-10-CM | POA: Diagnosis not present

## 2014-04-25 DIAGNOSIS — N039 Chronic nephritic syndrome with unspecified morphologic changes: Secondary | ICD-10-CM | POA: Diagnosis not present

## 2014-04-25 DIAGNOSIS — N186 End stage renal disease: Secondary | ICD-10-CM | POA: Diagnosis not present

## 2014-04-30 DIAGNOSIS — D631 Anemia in chronic kidney disease: Secondary | ICD-10-CM | POA: Diagnosis not present

## 2014-04-30 DIAGNOSIS — N186 End stage renal disease: Secondary | ICD-10-CM | POA: Diagnosis not present

## 2014-05-01 DIAGNOSIS — Z5181 Encounter for therapeutic drug level monitoring: Secondary | ICD-10-CM | POA: Diagnosis not present

## 2014-05-01 DIAGNOSIS — Z79899 Other long term (current) drug therapy: Secondary | ICD-10-CM | POA: Diagnosis not present

## 2014-05-01 DIAGNOSIS — IMO0002 Reserved for concepts with insufficient information to code with codable children: Secondary | ICD-10-CM | POA: Diagnosis not present

## 2014-05-01 DIAGNOSIS — M5137 Other intervertebral disc degeneration, lumbosacral region: Secondary | ICD-10-CM | POA: Diagnosis not present

## 2014-05-01 DIAGNOSIS — G894 Chronic pain syndrome: Secondary | ICD-10-CM | POA: Diagnosis not present

## 2014-05-01 DIAGNOSIS — M47817 Spondylosis without myelopathy or radiculopathy, lumbosacral region: Secondary | ICD-10-CM | POA: Diagnosis not present

## 2014-05-02 DIAGNOSIS — D631 Anemia in chronic kidney disease: Secondary | ICD-10-CM | POA: Diagnosis not present

## 2014-05-02 DIAGNOSIS — N186 End stage renal disease: Secondary | ICD-10-CM | POA: Diagnosis not present

## 2014-05-02 DIAGNOSIS — D509 Iron deficiency anemia, unspecified: Secondary | ICD-10-CM | POA: Diagnosis not present

## 2014-05-22 DIAGNOSIS — E785 Hyperlipidemia, unspecified: Secondary | ICD-10-CM | POA: Diagnosis not present

## 2014-05-22 DIAGNOSIS — Z79899 Other long term (current) drug therapy: Secondary | ICD-10-CM | POA: Diagnosis not present

## 2014-05-22 DIAGNOSIS — F411 Generalized anxiety disorder: Secondary | ICD-10-CM | POA: Diagnosis not present

## 2014-05-22 DIAGNOSIS — E119 Type 2 diabetes mellitus without complications: Secondary | ICD-10-CM | POA: Diagnosis not present

## 2014-05-22 DIAGNOSIS — K2100 Gastro-esophageal reflux disease with esophagitis, without bleeding: Secondary | ICD-10-CM | POA: Diagnosis not present

## 2014-05-22 DIAGNOSIS — I10 Essential (primary) hypertension: Secondary | ICD-10-CM | POA: Diagnosis not present

## 2014-05-22 DIAGNOSIS — J45901 Unspecified asthma with (acute) exacerbation: Secondary | ICD-10-CM | POA: Diagnosis not present

## 2014-05-31 DIAGNOSIS — N186 End stage renal disease: Secondary | ICD-10-CM | POA: Diagnosis not present

## 2014-06-06 DIAGNOSIS — D631 Anemia in chronic kidney disease: Secondary | ICD-10-CM | POA: Diagnosis not present

## 2014-06-06 DIAGNOSIS — N186 End stage renal disease: Secondary | ICD-10-CM | POA: Diagnosis not present

## 2014-06-06 DIAGNOSIS — N039 Chronic nephritic syndrome with unspecified morphologic changes: Secondary | ICD-10-CM | POA: Diagnosis not present

## 2014-06-06 DIAGNOSIS — D509 Iron deficiency anemia, unspecified: Secondary | ICD-10-CM | POA: Diagnosis not present

## 2014-06-13 DIAGNOSIS — N186 End stage renal disease: Secondary | ICD-10-CM | POA: Diagnosis not present

## 2014-06-13 DIAGNOSIS — D509 Iron deficiency anemia, unspecified: Secondary | ICD-10-CM | POA: Diagnosis not present

## 2014-06-13 DIAGNOSIS — D631 Anemia in chronic kidney disease: Secondary | ICD-10-CM | POA: Diagnosis not present

## 2014-06-18 DIAGNOSIS — N186 End stage renal disease: Secondary | ICD-10-CM | POA: Diagnosis not present

## 2014-06-18 DIAGNOSIS — D509 Iron deficiency anemia, unspecified: Secondary | ICD-10-CM | POA: Diagnosis not present

## 2014-06-18 DIAGNOSIS — N039 Chronic nephritic syndrome with unspecified morphologic changes: Secondary | ICD-10-CM | POA: Diagnosis not present

## 2014-06-18 DIAGNOSIS — D631 Anemia in chronic kidney disease: Secondary | ICD-10-CM | POA: Diagnosis not present

## 2014-06-26 DIAGNOSIS — D509 Iron deficiency anemia, unspecified: Secondary | ICD-10-CM | POA: Diagnosis not present

## 2014-06-26 DIAGNOSIS — D631 Anemia in chronic kidney disease: Secondary | ICD-10-CM | POA: Diagnosis not present

## 2014-06-26 DIAGNOSIS — R1031 Right lower quadrant pain: Secondary | ICD-10-CM | POA: Diagnosis not present

## 2014-06-26 DIAGNOSIS — N186 End stage renal disease: Secondary | ICD-10-CM | POA: Diagnosis not present

## 2014-06-27 ENCOUNTER — Emergency Department (HOSPITAL_COMMUNITY)
Admission: EM | Admit: 2014-06-27 | Discharge: 2014-06-28 | Disposition: A | Discharge status: Home / Self Care | Payer: Medicare Other | Attending: Emergency Medicine | Admitting: Emergency Medicine

## 2014-06-27 ENCOUNTER — Emergency Department (HOSPITAL_COMMUNITY): Payer: Medicare Other

## 2014-06-27 ENCOUNTER — Encounter (HOSPITAL_COMMUNITY): Payer: Self-pay | Admitting: Emergency Medicine

## 2014-06-27 DIAGNOSIS — Z8639 Personal history of other endocrine, nutritional and metabolic disease: Secondary | ICD-10-CM | POA: Insufficient documentation

## 2014-06-27 DIAGNOSIS — F3289 Other specified depressive episodes: Secondary | ICD-10-CM | POA: Insufficient documentation

## 2014-06-27 DIAGNOSIS — F17200 Nicotine dependence, unspecified, uncomplicated: Secondary | ICD-10-CM | POA: Insufficient documentation

## 2014-06-27 DIAGNOSIS — F329 Major depressive disorder, single episode, unspecified: Secondary | ICD-10-CM | POA: Diagnosis not present

## 2014-06-27 DIAGNOSIS — Z8739 Personal history of other diseases of the musculoskeletal system and connective tissue: Secondary | ICD-10-CM | POA: Insufficient documentation

## 2014-06-27 DIAGNOSIS — Z79899 Other long term (current) drug therapy: Secondary | ICD-10-CM | POA: Diagnosis not present

## 2014-06-27 DIAGNOSIS — D631 Anemia in chronic kidney disease: Secondary | ICD-10-CM | POA: Diagnosis not present

## 2014-06-27 DIAGNOSIS — K219 Gastro-esophageal reflux disease without esophagitis: Secondary | ICD-10-CM | POA: Insufficient documentation

## 2014-06-27 DIAGNOSIS — E669 Obesity, unspecified: Secondary | ICD-10-CM | POA: Insufficient documentation

## 2014-06-27 DIAGNOSIS — R509 Fever, unspecified: Secondary | ICD-10-CM | POA: Diagnosis not present

## 2014-06-27 DIAGNOSIS — N186 End stage renal disease: Secondary | ICD-10-CM

## 2014-06-27 DIAGNOSIS — K2990 Gastroduodenitis, unspecified, without bleeding: Secondary | ICD-10-CM | POA: Diagnosis not present

## 2014-06-27 DIAGNOSIS — I120 Hypertensive chronic kidney disease with stage 5 chronic kidney disease or end stage renal disease: Secondary | ICD-10-CM | POA: Insufficient documentation

## 2014-06-27 DIAGNOSIS — Z8669 Personal history of other diseases of the nervous system and sense organs: Secondary | ICD-10-CM | POA: Diagnosis not present

## 2014-06-27 DIAGNOSIS — F411 Generalized anxiety disorder: Secondary | ICD-10-CM | POA: Insufficient documentation

## 2014-06-27 DIAGNOSIS — R1031 Right lower quadrant pain: Secondary | ICD-10-CM | POA: Insufficient documentation

## 2014-06-27 DIAGNOSIS — Z992 Dependence on renal dialysis: Secondary | ICD-10-CM | POA: Insufficient documentation

## 2014-06-27 DIAGNOSIS — K2970 Gastritis, unspecified, without bleeding: Secondary | ICD-10-CM | POA: Insufficient documentation

## 2014-06-27 DIAGNOSIS — Z862 Personal history of diseases of the blood and blood-forming organs and certain disorders involving the immune mechanism: Secondary | ICD-10-CM | POA: Insufficient documentation

## 2014-06-27 DIAGNOSIS — Z792 Long term (current) use of antibiotics: Secondary | ICD-10-CM | POA: Insufficient documentation

## 2014-06-27 DIAGNOSIS — D649 Anemia, unspecified: Secondary | ICD-10-CM | POA: Diagnosis not present

## 2014-06-27 DIAGNOSIS — R109 Unspecified abdominal pain: Secondary | ICD-10-CM | POA: Diagnosis not present

## 2014-06-27 DIAGNOSIS — Z9889 Other specified postprocedural states: Secondary | ICD-10-CM | POA: Insufficient documentation

## 2014-06-27 DIAGNOSIS — D509 Iron deficiency anemia, unspecified: Secondary | ICD-10-CM | POA: Diagnosis not present

## 2014-06-27 DIAGNOSIS — R0602 Shortness of breath: Secondary | ICD-10-CM | POA: Diagnosis not present

## 2014-06-27 DIAGNOSIS — N269 Renal sclerosis, unspecified: Secondary | ICD-10-CM | POA: Diagnosis not present

## 2014-06-27 DIAGNOSIS — J45909 Unspecified asthma, uncomplicated: Secondary | ICD-10-CM | POA: Insufficient documentation

## 2014-06-27 DIAGNOSIS — K5730 Diverticulosis of large intestine without perforation or abscess without bleeding: Secondary | ICD-10-CM | POA: Diagnosis not present

## 2014-06-27 DIAGNOSIS — R079 Chest pain, unspecified: Secondary | ICD-10-CM | POA: Diagnosis not present

## 2014-06-27 DIAGNOSIS — K5732 Diverticulitis of large intestine without perforation or abscess without bleeding: Secondary | ICD-10-CM

## 2014-06-27 LAB — COMPREHENSIVE METABOLIC PANEL
ALT: 13 U/L (ref 0–35)
ANION GAP: 17 — AB (ref 5–15)
AST: 12 U/L (ref 0–37)
Albumin: 2.9 g/dL — ABNORMAL LOW (ref 3.5–5.2)
Alkaline Phosphatase: 69 U/L (ref 39–117)
BUN: 22 mg/dL (ref 6–23)
CO2: 26 mEq/L (ref 19–32)
Calcium: 9.5 mg/dL (ref 8.4–10.5)
Chloride: 96 mEq/L (ref 96–112)
Creatinine, Ser: 4.53 mg/dL — ABNORMAL HIGH (ref 0.50–1.10)
GFR calc Af Amer: 13 mL/min — ABNORMAL LOW (ref >90)
GFR calc non Af Amer: 11 mL/min — ABNORMAL LOW (ref >90)
Glucose, Bld: 159 mg/dL — ABNORMAL HIGH (ref 70–99)
POTASSIUM: 3.2 meq/L — AB (ref 3.7–5.3)
SODIUM: 139 meq/L (ref 137–147)
Total Bilirubin: <0.2 mg/dL — ABNORMAL LOW (ref 0.3–1.2)
Total Protein: 6.5 g/dL (ref 6.0–8.3)

## 2014-06-27 LAB — CBC WITH DIFFERENTIAL/PLATELET
BASOS ABS: 0 10*3/uL (ref 0.0–0.1)
Basophils Relative: 0 % (ref 0–1)
EOS ABS: 0 10*3/uL (ref 0.0–0.7)
Eosinophils Relative: 0 % (ref 0–5)
HCT: 38.8 % (ref 36.0–46.0)
Hemoglobin: 12.2 g/dL (ref 12.0–15.0)
Lymphocytes Relative: 23 % (ref 12–46)
Lymphs Abs: 2.2 10*3/uL (ref 0.7–4.0)
MCH: 31.8 pg (ref 26.0–34.0)
MCHC: 31.4 g/dL (ref 30.0–36.0)
MCV: 101 fL — ABNORMAL HIGH (ref 78.0–100.0)
Monocytes Absolute: 0.6 10*3/uL (ref 0.1–1.0)
Monocytes Relative: 6 % (ref 3–12)
NEUTROS PCT: 71 % (ref 43–77)
Neutro Abs: 7 10*3/uL (ref 1.7–7.7)
PLATELETS: 297 10*3/uL (ref 150–400)
RBC: 3.84 MIL/uL — ABNORMAL LOW (ref 3.87–5.11)
RDW: 16.2 % — AB (ref 11.5–15.5)
WBC: 9.8 10*3/uL (ref 4.0–10.5)

## 2014-06-27 LAB — I-STAT CG4 LACTIC ACID, ED: LACTIC ACID, VENOUS: 2.31 mmol/L — AB (ref 0.5–2.2)

## 2014-06-27 MED ORDER — ONDANSETRON HCL 4 MG/2ML IJ SOLN
4.0000 mg | Freq: Once | INTRAMUSCULAR | Status: AC
  Administered 2014-06-27: 4 mg via INTRAVENOUS
  Filled 2014-06-27: qty 2

## 2014-06-27 MED ORDER — HYDROMORPHONE HCL PF 1 MG/ML IJ SOLN
1.0000 mg | Freq: Once | INTRAMUSCULAR | Status: AC
  Administered 2014-06-27: 1 mg via INTRAVENOUS
  Filled 2014-06-27: qty 1

## 2014-06-27 MED ORDER — IOHEXOL 300 MG/ML  SOLN
25.0000 mL | Freq: Once | INTRAMUSCULAR | Status: AC | PRN
  Administered 2014-06-27: 25 mL via ORAL

## 2014-06-27 NOTE — ED Notes (Signed)
Pt in c/o generalized abd pain with nausea for the last few days, intermittent fever, seen at PCP for this and called tonight and told she had elevated WBC, pt is a dialysis patient and had dialysis completed today

## 2014-06-27 NOTE — ED Provider Notes (Signed)
CSN: [id number]     Arrival date & time 06/27/14  2120 History   First MD Initiated Contact with Patient 06/27/14 2311     Chief Complaint  Patient presents with  . Abdominal Pain     (Consider location/radiation/quality/duration/timing/severity/associated sxs/prior Treatment) Patient is a 43 y.o. female presenting with abdominal pain. The history is provided by the patient.  Abdominal Pain She has been having severe right lower abdominal pain for the last 2 weeks. Pain is constant. It is worse with trying to urinate and worse with trying to have a bowel movement. There is associated nausea but no vomiting. She has had subjective fever but no chills or sweats. She rates pain a 10/10. She was seen by her PCP who told her that she had an elevated WBC and had to come to the ED. She is a dialysis patient and had dialysis earlier today. She has been on dialysis for more than 2 years.  Past Medical History  Diagnosis Date  . Anemia   . Depression   . Reflux   . Dizziness   . Anxiety   . ADD (attention deficit disorder)   . Asthma   . Headache(784.0)   . Gout   . Anemia   . GERD (gastroesophageal reflux disease)   . Obesity   . Gastritis   . Blood transfusion 06/2011    6 units transfused at [facility]  . Arthritis     knee, wrist, back   . Bronchitis   . Shortness of breath   . Sleep apnea     does not use CPAP  . Neuromuscular disorder     carpal tunnel  . Hypertension     sees Dr. Wendie Agreste  . Chronic kidney disease     Atrophic left kidney, sees Dr. Hassell Done  . Renal insufficiency    Past Surgical History  Procedure Laterality Date  . Cesarean section    . Spine surgery  2004    Lumbar diskectomy   . Microdiskectomy  04/2001    Herniated disk L5-S1  . Renal biopsy  04/2004  . Wisdom tooth extraction    . Carpel tunnel release      x 2; bilateral  . Upper gastrointestinal endoscopy    . Colonscopy    . Dilation and curettage of uterus    . Av fistula surgery  11/30/11     [facility] - redo fistula  . Hysteroscopy with thermachoice  12/10/2011    Procedure: HYSTEROSCOPY WITH THERMACHOICE;  Surgeon: Betsy Coder, MD;  Location: [facility];  Service: Gynecology;  Laterality: N/A;  . Shuntogram  Dec. 18, 2013    Left arm  . Av fistula placement  06/10/11    Left brachiocephalic AVF  . Irrigation and debridement abscess Left 02/15/2014    Procedure: IRRIGATION AND DRAINAGE OF LOW BACK ABSCESS;  Surgeon: Imogene Burn. Georgette Dover, MD;  Location: [facility];  Service: General;  Laterality: Left;   Family History  Problem Relation Age of Onset  . Hypertension Mother   . Heart disease Father     Heart Disease before age 49  . Hypertension Father   . Heart attack Father   . Hypertension Daughter    History  Substance Use Topics  . Smoking status: Current Every Day Smoker -- 1.00 packs/day for 10 years    Types: Cigarettes  . Smokeless tobacco: Never Used  . Alcohol Use: Yes     Comment: ocassional   OB History  Grav Para Term Preterm Abortions TAB SAB Ect Mult Living                 Review of Systems  Gastrointestinal: Positive for abdominal pain.  All other systems reviewed and are negative.     Allergies  Nsaids; Clarithromycin; Demerol; Dilaudid; Fentanyl; Morphine and related; Other; and Vancomycin  Home Medications   Prior to Admission medications   Medication Sig Start Date End Date Taking? Authorizing Provider  acetaminophen (TYLENOL) 500 MG tablet Take 1,000 mg by mouth daily as needed. For headache    Historical Provider, MD  albuterol (PROVENTIL HFA;VENTOLIN HFA) 108 (90 BASE) MCG/ACT inhaler Inhale 2 puffs into the lungs every 6 (six) hours as needed. For shortness of breath     Historical Provider, MD  allopurinol (ZYLOPRIM) 300 MG tablet Take 300 mg by mouth at bedtime.     Historical Provider, MD  ALPRAZolam Duanne Moron) 0.5 MG tablet Take 0.5 mg by mouth 3 (three) times daily.     Historical Provider, MD  amLODipine (NORVASC) 10 MG tablet Take 10 mg  by mouth daily. 02/08/14   Historical Provider, MD  budesonide-formoterol (SYMBICORT) 160-4.5 MCG/ACT inhaler Inhale 2 puffs into the lungs 2 (two) times daily.    Historical Provider, MD  calcium acetate (PHOSLO) 667 MG capsule Take 667 mg by mouth 3 (three) times daily with meals.    Historical Provider, MD  clindamycin (CLEOCIN) 300 MG capsule Take 1 capsule (300 mg total) by mouth 3 (three) times daily. 02/17/14   Verlee Monte, MD  cyclobenzaprine (FLEXERIL) 10 MG tablet Take 10 mg by mouth 2 (two) times daily as needed for muscle spasms. For muscle spasms    Historical Provider, MD  esomeprazole (NEXIUM) 40 MG capsule Take 40 mg by mouth daily.     Historical Provider, MD  furosemide (LASIX) 80 MG tablet Take 80 mg by mouth 2 (two) times daily.     Historical Provider, MD  HYDROcodone-acetaminophen (NORCO/VICODIN) 5-325 MG per tablet Take 1-2 tablets by mouth every 6 (six) hours as needed for moderate pain. 02/17/14   Verlee Monte, MD  lidocaine-prilocaine (EMLA) cream Apply 1 application topically 3 (three) times a week. For dialysis port.    Historical Provider, MD  lisinopril (PRINIVIL,ZESTRIL) 20 MG tablet Take 20 mg by mouth daily.  04/06/13   Historical Provider, MD  Melatonin 5 MG TABS Take 5 mg by mouth daily.    Historical Provider, MD  methocarbamol (ROBAXIN) 500 MG tablet Take 500 mg by mouth 2 (two) times daily.  03/22/13   Historical Provider, MD  montelukast (SINGULAIR) 10 MG tablet Take 10 mg by mouth every morning.     Historical Provider, MD  multivitamin (RENA-VIT) TABS tablet Take 1 tablet by mouth daily.    Historical Provider, MD  oxymorphone (OPANA ER) 20 MG 12 hr tablet Take 20 mg by mouth every 12 (twelve) hours.    Historical Provider, MD  prochlorperazine (COMPAZINE) 5 MG tablet Take 5 mg by mouth daily as needed. 01/08/14   Historical Provider, MD  promethazine (PHENERGAN) 25 MG tablet Take 25 mg by mouth every 6 (six) hours as needed. For nausea    Historical Provider, MD   saccharomyces boulardii (FLORASTOR) 250 MG capsule Take 1 capsule (250 mg total) by mouth 2 (two) times daily. 02/17/14   Verlee Monte, MD  SUMAtriptan (IMITREX) 50 MG tablet Take 50 mg by mouth daily as needed. For migraines 02/14/14   Historical Provider, MD   BP  153/81  Pulse 115  Temp(Src) 98.9 F (37.2 C) (Oral)  Resp 24  SpO2 100%  LMP 06/27/2014 Physical Exam  Nursing note and vitals reviewed.  Morbidly obese 43 year old female, resting comfortably and in no acute distress. Vital signs are significant for hypertension and tachycardia and tachypnea. Oxygen saturation is 100%, which is normal. Head is normocephalic and atraumatic. PERRLA, EOMI. Oropharynx is clear. Neck is nontender and supple without adenopathy or JVD. Back is nontender and there is no CVA tenderness. Lungs are clear without rales, wheezes, or rhonchi. Chest is nontender. Heart has regular rate and rhythm without murmur. Abdomen is soft, flat, with moderate right lower quadrant tenderness. Tenderness is poorly localized. There is no rebound or guarding. There are no masses or hepatosplenomegaly and peristalsis is hypoactive. Extremities have 2+ edema on the right lower leg and 1+ edema of her leg. Patient states that the right leg is always more so on the left. He he fistula is present in the left upper arm with prominent thrill. Skin is warm and dry without rash. Neurologic: Mental status is normal, cranial nerves are intact, there are no motor or sensory deficits.  ED Course  Procedures (including critical care time) Labs Review Results for orders placed during the hospital encounter of 06/27/14  CBC WITH DIFFERENTIAL      Result Value Ref Range   WBC 9.8  4.0 - 10.5 K/uL   RBC 3.84 (*) 3.87 - 5.11 MIL/uL   Hemoglobin 12.2  12.0 - 15.0 g/dL   HCT 38.8  36.0 - 46.0 %   MCV 101.0 (*) 78.0 - 100.0 fL   MCH 31.8  26.0 - 34.0 pg   MCHC 31.4  30.0 - 36.0 g/dL   RDW 16.2 (*) 11.5 - 15.5 %   Platelets 297  150  - 400 K/uL   Neutrophils Relative % 71  43 - 77 %   Neutro Abs 7.0  1.7 - 7.7 K/uL   Lymphocytes Relative 23  12 - 46 %   Lymphs Abs 2.2  0.7 - 4.0 K/uL   Monocytes Relative 6  3 - 12 %   Monocytes Absolute 0.6  0.1 - 1.0 K/uL   Eosinophils Relative 0  0 - 5 %   Eosinophils Absolute 0.0  0.0 - 0.7 K/uL   Basophils Relative 0  0 - 1 %   Basophils Absolute 0.0  0.0 - 0.1 K/uL  COMPREHENSIVE METABOLIC PANEL      Result Value Ref Range   Sodium 139  137 - 147 mEq/L   Potassium 3.2 (*) 3.7 - 5.3 mEq/L   Chloride 96  96 - 112 mEq/L   CO2 26  19 - 32 mEq/L   Glucose, Bld 159 (*) 70 - 99 mg/dL   BUN 22  6 - 23 mg/dL   Creatinine, Ser 4.53 (*) 0.50 - 1.10 mg/dL   Calcium 9.5  8.4 - 10.5 mg/dL   Total Protein 6.5  6.0 - 8.3 g/dL   Albumin 2.9 (*) 3.5 - 5.2 g/dL   AST 12  0 - 37 U/L   ALT 13  0 - 35 U/L   Alkaline Phosphatase 69  39 - 117 U/L   Total Bilirubin <0.2 (*) 0.3 - 1.2 mg/dL   GFR calc non Af Amer 11 (*) >90 mL/min   GFR calc Af Amer 13 (*) >90 mL/min   Anion gap 17 (*) 5 - 15  I-STAT CG4 LACTIC ACID, ED  Result Value Ref Range   Lactic Acid, Venous 2.31 (*) 0.5 - 2.2 mmol/L   Dg Chest 2 View  06/27/2014   CLINICAL DATA:  Chest pain, shortness of breath, fever, abdominal pain.  EXAM: CHEST  2 VIEW  COMPARISON:  02/15/2014  FINDINGS: The heart size and mediastinal contours are within normal limits. Both lungs are clear. The visualized skeletal structures are unremarkable.  IMPRESSION: No active cardiopulmonary disease.   Electronically Signed   By: Lucienne Capers M.D.   On: 06/27/2014 22:43   Ct Abdomen Pelvis W Contrast  06/28/2014   CLINICAL DATA:  Severe right lower quadrant pain and fever.  EXAM: CT ABDOMEN AND PELVIS WITH CONTRAST  TECHNIQUE: Multidetector CT imaging of the abdomen and pelvis was performed using the standard protocol following bolus administration of intravenous contrast.  CONTRAST:  16mL OMNIPAQUE IOHEXOL 300 MG/ML  SOLN  COMPARISON:  06/16/2011   FINDINGS: Lung bases are clear.  The liver, spleen, gallbladder, pancreas, adrenal glands, abdominal aorta, inferior vena cava, and retroperitoneal lymph nodes are unremarkable. Bilateral renal parenchymal atrophy without hydronephrosis. Stomach and small bowel are not abnormally distended. Stool-filled colon without distention. Scattered colonic diverticulum. No free air or free fluid in the abdomen.  Pelvis: Diverticulosis of the sigmoid colon. Mild colonic wall thickening with mild stranding at the rectosigmoid junction suggesting early diverticulitis. Uterus and ovaries are not enlarged. Bladder wall is not thickened. No free or loculated pelvic fluid collections. Appendix is normal. Degenerative changes in the lumbar spine. Prominent disc osteophyte complexes posteriorly at L4-5 and L5-S1 levels. No destructive bone lesions.  IMPRESSION: Diverticulosis of sigmoid colon with inflammatory stranding around the rectosigmoid junction suggesting mild diverticulitis. No abscess. Appendix is normal. Bilateral renal atrophy.   Electronically Signed   By: Lucienne Capers M.D.   On: 06/28/2014 00:45     Imaging Review Dg Chest 2 View  06/27/2014   CLINICAL DATA:  Chest pain, shortness of breath, fever, abdominal pain.  EXAM: CHEST  2 VIEW  COMPARISON:  02/15/2014  FINDINGS: The heart size and mediastinal contours are within normal limits. Both lungs are clear. The visualized skeletal structures are unremarkable.  IMPRESSION: No active cardiopulmonary disease.   Electronically Signed   By: Lucienne Capers M.D.   On: 06/27/2014 22:43   Ct Abdomen Pelvis W Contrast  06/28/2014   CLINICAL DATA:  Severe right lower quadrant pain and fever.  EXAM: CT ABDOMEN AND PELVIS WITH CONTRAST  TECHNIQUE: Multidetector CT imaging of the abdomen and pelvis was performed using the standard protocol following bolus administration of intravenous contrast.  CONTRAST:  163mL OMNIPAQUE IOHEXOL 300 MG/ML  SOLN  COMPARISON:  06/16/2011   FINDINGS: Lung bases are clear.  The liver, spleen, gallbladder, pancreas, adrenal glands, abdominal aorta, inferior vena cava, and retroperitoneal lymph nodes are unremarkable. Bilateral renal parenchymal atrophy without hydronephrosis. Stomach and small bowel are not abnormally distended. Stool-filled colon without distention. Scattered colonic diverticulum. No free air or free fluid in the abdomen.  Pelvis: Diverticulosis of the sigmoid colon. Mild colonic wall thickening with mild stranding at the rectosigmoid junction suggesting early diverticulitis. Uterus and ovaries are not enlarged. Bladder wall is not thickened. No free or loculated pelvic fluid collections. Appendix is normal. Degenerative changes in the lumbar spine. Prominent disc osteophyte complexes posteriorly at L4-5 and L5-S1 levels. No destructive bone lesions.  IMPRESSION: Diverticulosis of sigmoid colon with inflammatory stranding around the rectosigmoid junction suggesting mild diverticulitis. No abscess. Appendix is normal. Bilateral renal atrophy.  Electronically Signed   By: Lucienne Capers M.D.   On: 06/28/2014 00:45    MDM   Final diagnoses:  Diverticulitis of large intestine without perforation or abscess without bleeding  End stage renal disease on dialysis    Right lower quadrant pain of uncertain cause. Went to time and has been present would argue against appendicitis but another story a possibility. She will be sent for CT scan for further evaluation. Of note, WBC is normal in the ED today.  CT scan shows evidence of diverticulitis and a normal appendix. She is discharged with prescriptions for ciprofloxacin and metronidazole.  Delora Fuel, MD [id number]

## 2014-06-28 ENCOUNTER — Emergency Department (HOSPITAL_COMMUNITY): Payer: Medicare Other

## 2014-06-28 DIAGNOSIS — K5730 Diverticulosis of large intestine without perforation or abscess without bleeding: Secondary | ICD-10-CM | POA: Diagnosis not present

## 2014-06-28 DIAGNOSIS — N269 Renal sclerosis, unspecified: Secondary | ICD-10-CM | POA: Diagnosis not present

## 2014-06-28 MED ORDER — METRONIDAZOLE 500 MG PO TABS
500.0000 mg | ORAL_TABLET | Freq: Once | ORAL | Status: AC
  Administered 2014-06-28: 500 mg via ORAL
  Filled 2014-06-28: qty 1

## 2014-06-28 MED ORDER — CIPROFLOXACIN HCL 500 MG PO TABS: 500.0000 mg | ORAL_TABLET | Freq: Every day | ORAL | Status: DC

## 2014-06-28 MED ORDER — METRONIDAZOLE 500 MG PO TABS
500.0000 mg | ORAL_TABLET | Freq: Two times a day (BID) | ORAL | Status: DC
Start: 2014-06-28 — End: 2017-01-04

## 2014-06-28 MED ORDER — IOHEXOL 300 MG/ML  SOLN
100.0000 mL | Freq: Once | INTRAMUSCULAR | Status: AC | PRN
  Administered 2014-06-28: 100 mL via INTRAVENOUS

## 2014-06-28 MED ORDER — CIPROFLOXACIN HCL 500 MG PO TABS
500.0000 mg | ORAL_TABLET | Freq: Once | ORAL | Status: AC
  Administered 2014-06-28: 500 mg via ORAL
  Filled 2014-06-28: qty 1

## 2014-06-28 MED ORDER — HYDROMORPHONE HCL PF 1 MG/ML IJ SOLN
1.0000 mg | Freq: Once | INTRAMUSCULAR | Status: AC
  Administered 2014-06-28: 1 mg via INTRAVENOUS
  Filled 2014-06-28: qty 1

## 2014-06-28 NOTE — Discharge Instructions (Signed)
Diverticulitis Diverticulitis is inflammation or infection of small pouches in your colon that form when you have a condition called diverticulosis. The pouches in your colon are called diverticula. Your colon, or large intestine, is where water is absorbed and stool is formed. Complications of diverticulitis can include:  Bleeding.  Severe infection.  Severe pain.  Perforation of your colon.  Obstruction of your colon. CAUSES  Diverticulitis is caused by bacteria. Diverticulitis happens when stool becomes trapped in diverticula. This allows bacteria to grow in the diverticula, which can lead to inflammation and infection. RISK FACTORS People with diverticulosis are at risk for diverticulitis. Eating a diet that does not include enough fiber from fruits and vegetables may make diverticulitis more likely to develop. SYMPTOMS  Symptoms of diverticulitis may include:  Abdominal pain and tenderness. The pain is normally located on the left side of the abdomen, but may occur in other areas.  Fever and chills.  Bloating.  Cramping.  Nausea.  Vomiting.  Constipation.  Diarrhea.  Blood in your stool. DIAGNOSIS  Your health care provider will ask you about your medical history and do a physical exam. You may need to have tests done because many medical conditions can cause the same symptoms as diverticulitis. Tests may include:  Blood tests.  Urine tests.  Imaging tests of the abdomen, including X-rays and CT scans. When your condition is under control, your health care provider may recommend that you have a colonoscopy. A colonoscopy can show how severe your diverticula are and whether something else is causing your symptoms. TREATMENT  Most cases of diverticulitis are mild and can be treated at home. Treatment may include:  Taking over-the-counter pain medicines.  Following a clear liquid diet.  Taking antibiotic medicines by mouth for 7-10 days. More severe cases may  be treated at a hospital. Treatment may include:  Not eating or drinking.  Taking prescription pain medicine.  Receiving antibiotic medicines through an IV tube.  Receiving fluids and nutrition through an IV tube.  Surgery. HOME CARE INSTRUCTIONS   Follow your health care provider's instructions carefully.  Follow a full liquid diet or other diet as directed by your health care provider. After your symptoms improve, your health care provider may tell you to change your diet. He or she may recommend you eat a high-fiber diet. Fruits and vegetables are good sources of fiber. Fiber makes it easier to pass stool.  Take fiber supplements or probiotics as directed by your health care provider.  Only take medicines as directed by your health care provider.  Keep all your follow-up appointments. SEEK MEDICAL CARE IF:   Your pain does not improve.  You have a hard time eating food.  Your bowel movements do not return to normal. SEEK IMMEDIATE MEDICAL CARE IF:   Your pain becomes worse.  Your symptoms do not get better.  Your symptoms suddenly get worse.  You have a fever.  You have repeated vomiting.  You have bloody or black, tarry stools. MAKE SURE YOU:   Understand these instructions.  Will watch your condition.  Will get help right away if you are not doing well or get worse. Document Released: 07/28/2005 Document Revised: 10/23/2013 Document Reviewed: 09/12/2013 [facility] Patient Information 2015 [place], [place]. This information is not intended to replace advice given to you by your health care provider. Make sure you discuss any questions you have with your health care provider.  Ciprofloxacin tablets What is this medicine? CIPROFLOXACIN (sip roe FLOX a sin) is  a quinolone antibiotic. It is used to treat certain kinds of bacterial infections. It will not work for colds, flu, or other viral infections. This medicine may be used for other purposes; ask your health  care provider or pharmacist if you have questions. COMMON BRAND NAME(S): Cipro What should I tell my health care provider before I take this medicine? They need to know if you have any of these conditions: -bone problems -cerebral disease -joint problems -irregular heartbeat -kidney disease -liver disease -myasthenia gravis -seizure disorder -tendon problems -an unusual or allergic reaction to ciprofloxacin, other antibiotics or medicines, foods, dyes, or preservatives -pregnant or trying to get pregnant -breast-feeding How should I use this medicine? Take this medicine by mouth with a glass of water. Follow the directions on the prescription label. Take your medicine at regular intervals. Do not take your medicine more often than directed. Take all of your medicine as directed even if you think your are better. Do not skip doses or stop your medicine early. You can take this medicine with food or on an empty stomach. It can be taken with a meal that contains dairy or calcium, but do not take it alone with a dairy product, like milk or yogurt or calcium-fortified juice. A special MedGuide will be given to you by the pharmacist with each prescription and refill. Be sure to read this information carefully each time. Talk to your pediatrician regarding the use of this medicine in children. Special care may be needed. Overdosage: If you think you have taken too much of this medicine contact a poison control center or emergency room at once. NOTE: This medicine is only for you. Do not share this medicine with others. What if I miss a dose? If you miss a dose, take it as soon as you can. If it is almost time for your next dose, take only that dose. Do not take double or extra doses. What may interact with this medicine? Do not take this medicine with any of the following medications: -cisapride -droperidol -terfenadine -tizanidine This medicine may also interact with the following  medications: -antacids -birth control pills -caffeine -cyclosporin -didanosine (ddI) buffered tablets or powder -medicines for diabetes -medicines for inflammation like ibuprofen, naproxen -methotrexate -multivitamins -omeprazole -phenytoin -probenecid -sucralfate -theophylline -warfarin This list may not describe all possible interactions. Give your health care provider a list of all the medicines, herbs, non-prescription drugs, or dietary supplements you use. Also tell them if you smoke, drink alcohol, or use illegal drugs. Some items may interact with your medicine. What should I watch for while using this medicine? Tell your doctor or health care professional if your symptoms do not improve. Do not treat diarrhea with over the counter products. Contact your doctor if you have diarrhea that lasts more than 2 days or if it is severe and watery. You may get drowsy or dizzy. Do not drive, use machinery, or do anything that needs mental alertness until you know how this medicine affects you. Do not stand or sit up quickly, especially if you are an older patient. This reduces the risk of dizzy or fainting spells. This medicine can make you more sensitive to the sun. Keep out of the sun. If you cannot avoid being in the sun, wear protective clothing and use sunscreen. Do not use sun lamps or tanning beds/booths. Avoid antacids, aluminum, calcium, iron, magnesium, and zinc products for 6 hours before and 2 hours after taking a dose of this medicine. What side effects may  I notice from receiving this medicine? Side effects that you should report to your doctor or health care professional as soon as possible: - allergic reactions like skin rash, itching or hives, swelling of the face, lips, or tongue - breathing problems - confusion, nightmares or hallucinations - feeling faint or lightheaded, falls - irregular heartbeat - joint, muscle or tendon pain or swelling - pain or trouble  passing urine -persistent headache with or without blurred vision - redness, blistering, peeling or loosening of the skin, including inside the mouth - seizure - unusual pain, numbness, tingling, or weakness Side effects that usually do not require medical attention (report to your doctor or health care professional if they continue or are bothersome): - diarrhea - nausea or stomach upset - white patches or sores in the mouth This list may not describe all possible side effects. Call your doctor for medical advice about side effects. You may report side effects to FDA at 1-800-FDA-1088. Where should I keep my medicine? Keep out of the reach of children. Store at room temperature below 30 degrees C (86 degrees F). Keep container tightly closed. Throw away any unused medicine after the expiration date. NOTE: This sheet is a summary. It may not cover all possible information. If you have questions about this medicine, talk to your doctor, pharmacist, or health care provider.  2015, [employer]. (2013-05-24 16:10:46)  Metronidazole tablets or capsules What is this medicine? METRONIDAZOLE (me troe NI da zole) is an antiinfective. It is used to treat certain kinds of bacterial and protozoal infections. It will not work for colds, flu, or other viral infections. This medicine may be used for other purposes; ask your health care provider or pharmacist if you have questions. COMMON BRAND NAME(S): Flagyl What should I tell my health care provider before I take this medicine? They need to know if you have any of these conditions: -anemia or other blood disorders -disease of the nervous system -fungal or yeast infection -if you drink alcohol containing drinks -liver disease -seizures -an unusual or allergic reaction to metronidazole, or other medicines, foods, dyes, or preservatives -pregnant or trying to get pregnant -breast-feeding How should I use this medicine? Take this  medicine by mouth with a full glass of water. Follow the directions on the prescription label. Take your medicine at regular intervals. Do not take your medicine more often than directed. Take all of your medicine as directed even if you think you are better. Do not skip doses or stop your medicine early. Talk to your pediatrician regarding the use of this medicine in children. Special care may be needed. Overdosage: If you think you have taken too much of this medicine contact a poison control center or emergency room at once. NOTE: This medicine is only for you. Do not share this medicine with others. What if I miss a dose? If you miss a dose, take it as soon as you can. If it is almost time for your next dose, take only that dose. Do not take double or extra doses. What may interact with this medicine? Do not take this medicine with any of the following medications: -alcohol or any product that contains alcohol -amprenavir oral solution -cisapride -disulfiram -dofetilide -dronedarone -paclitaxel injection -pimozide -ritonavir oral solution -sertraline oral solution -sulfamethoxazole-trimethoprim injection -thioridazine -ziprasidone This medicine may also interact with the following medications: -birth control pills -cimetidine -lithium -other medicines that prolong the QT interval (cause an abnormal heart rhythm) -phenobarbital -phenytoin -warfarin This list may not  describe all possible interactions. Give your health care provider a list of all the medicines, herbs, non-prescription drugs, or dietary supplements you use. Also tell them if you smoke, drink alcohol, or use illegal drugs. Some items may interact with your medicine. What should I watch for while using this medicine? Tell your doctor or health care professional if your symptoms do not improve or if they get worse. You may get drowsy or dizzy. Do not drive, use machinery, or do anything that needs mental alertness until  you know how this medicine affects you. Do not stand or sit up quickly, especially if you are an older patient. This reduces the risk of dizzy or fainting spells. Avoid alcoholic drinks while you are taking this medicine and for three days afterward. Alcohol may make you feel dizzy, sick, or flushed. If you are being treated for a sexually transmitted disease, avoid sexual contact until you have finished your treatment. Your sexual partner may also need treatment. What side effects may I notice from receiving this medicine? Side effects that you should report to your doctor or health care professional as soon as possible: -allergic reactions like skin rash or hives, swelling of the face, lips, or tongue -confusion, clumsiness -difficulty speaking -discolored or sore mouth -dizziness -fever, infection -numbness, tingling, pain or weakness in the hands or feet -trouble passing urine or change in the amount of urine -redness, blistering, peeling or loosening of the skin, including inside the mouth -seizures -unusually weak or tired -vaginal irritation, dryness, or discharge Side effects that usually do not require medical attention (report to your doctor or health care professional if they continue or are bothersome): -diarrhea -headache -irritability -metallic taste -nausea -stomach pain or cramps -trouble sleeping This list may not describe all possible side effects. Call your doctor for medical advice about side effects. You may report side effects to FDA at 1-800-FDA-1088. Where should I keep my medicine? Keep out of the reach of children. Store at room temperature below 25 degrees C (77 degrees F). Protect from light. Keep container tightly closed. Throw away any unused medicine after the expiration date. NOTE: This sheet is a summary. It may not cover all possible information. If you have questions about this medicine, talk to your doctor, pharmacist, or health care provider.  2015,  [employer]. (2013-05-25 14:08:39)

## 2014-06-28 NOTE — ED Notes (Signed)
Patient transported to CT 

## 2014-06-28 NOTE — ED Notes (Signed)
Pt. Refused Tylenol suppository for HA.

## 2014-07-01 DIAGNOSIS — N186 End stage renal disease: Secondary | ICD-10-CM | POA: Diagnosis not present

## 2014-07-02 DIAGNOSIS — D509 Iron deficiency anemia, unspecified: Secondary | ICD-10-CM | POA: Diagnosis not present

## 2014-07-02 DIAGNOSIS — N186 End stage renal disease: Secondary | ICD-10-CM | POA: Diagnosis not present

## 2014-07-02 DIAGNOSIS — N039 Chronic nephritic syndrome with unspecified morphologic changes: Secondary | ICD-10-CM | POA: Diagnosis not present

## 2014-07-02 DIAGNOSIS — D631 Anemia in chronic kidney disease: Secondary | ICD-10-CM | POA: Diagnosis not present

## 2014-07-10 DIAGNOSIS — M47817 Spondylosis without myelopathy or radiculopathy, lumbosacral region: Secondary | ICD-10-CM | POA: Diagnosis not present

## 2014-07-10 DIAGNOSIS — IMO0002 Reserved for concepts with insufficient information to code with codable children: Secondary | ICD-10-CM | POA: Diagnosis not present

## 2014-07-10 DIAGNOSIS — G894 Chronic pain syndrome: Secondary | ICD-10-CM | POA: Diagnosis not present

## 2014-07-10 DIAGNOSIS — M5137 Other intervertebral disc degeneration, lumbosacral region: Secondary | ICD-10-CM | POA: Diagnosis not present

## 2014-07-31 DIAGNOSIS — N186 End stage renal disease: Secondary | ICD-10-CM | POA: Diagnosis not present

## 2014-08-08 DIAGNOSIS — Z23 Encounter for immunization: Secondary | ICD-10-CM | POA: Diagnosis not present

## 2014-08-08 DIAGNOSIS — D509 Iron deficiency anemia, unspecified: Secondary | ICD-10-CM | POA: Diagnosis not present

## 2014-08-08 DIAGNOSIS — N186 End stage renal disease: Secondary | ICD-10-CM | POA: Diagnosis not present

## 2014-08-08 DIAGNOSIS — D631 Anemia in chronic kidney disease: Secondary | ICD-10-CM | POA: Diagnosis not present

## 2014-08-10 DIAGNOSIS — Z23 Encounter for immunization: Secondary | ICD-10-CM | POA: Diagnosis not present

## 2014-08-10 DIAGNOSIS — D509 Iron deficiency anemia, unspecified: Secondary | ICD-10-CM | POA: Diagnosis not present

## 2014-08-10 DIAGNOSIS — N186 End stage renal disease: Secondary | ICD-10-CM | POA: Diagnosis not present

## 2014-08-10 DIAGNOSIS — D631 Anemia in chronic kidney disease: Secondary | ICD-10-CM | POA: Diagnosis not present

## 2014-08-15 DIAGNOSIS — D631 Anemia in chronic kidney disease: Secondary | ICD-10-CM | POA: Diagnosis not present

## 2014-08-15 DIAGNOSIS — N186 End stage renal disease: Secondary | ICD-10-CM | POA: Diagnosis not present

## 2014-08-15 DIAGNOSIS — D509 Iron deficiency anemia, unspecified: Secondary | ICD-10-CM | POA: Diagnosis not present

## 2014-08-15 DIAGNOSIS — Z23 Encounter for immunization: Secondary | ICD-10-CM | POA: Diagnosis not present

## 2014-08-16 DIAGNOSIS — M79605 Pain in left leg: Secondary | ICD-10-CM | POA: Diagnosis not present

## 2014-08-16 DIAGNOSIS — M79604 Pain in right leg: Secondary | ICD-10-CM | POA: Diagnosis not present

## 2014-08-16 DIAGNOSIS — M4806 Spinal stenosis, lumbar region: Secondary | ICD-10-CM | POA: Diagnosis not present

## 2014-08-17 DIAGNOSIS — Z23 Encounter for immunization: Secondary | ICD-10-CM | POA: Diagnosis not present

## 2014-08-17 DIAGNOSIS — N186 End stage renal disease: Secondary | ICD-10-CM | POA: Diagnosis not present

## 2014-08-17 DIAGNOSIS — D631 Anemia in chronic kidney disease: Secondary | ICD-10-CM | POA: Diagnosis not present

## 2014-08-17 DIAGNOSIS — D509 Iron deficiency anemia, unspecified: Secondary | ICD-10-CM | POA: Diagnosis not present

## 2014-08-19 DIAGNOSIS — F3181 Bipolar II disorder: Secondary | ICD-10-CM | POA: Diagnosis not present

## 2014-08-22 DIAGNOSIS — N186 End stage renal disease: Secondary | ICD-10-CM | POA: Diagnosis not present

## 2014-08-22 DIAGNOSIS — D509 Iron deficiency anemia, unspecified: Secondary | ICD-10-CM | POA: Diagnosis not present

## 2014-08-22 DIAGNOSIS — Z23 Encounter for immunization: Secondary | ICD-10-CM | POA: Diagnosis not present

## 2014-08-22 DIAGNOSIS — D631 Anemia in chronic kidney disease: Secondary | ICD-10-CM | POA: Diagnosis not present

## 2014-08-27 DIAGNOSIS — N186 End stage renal disease: Secondary | ICD-10-CM | POA: Diagnosis not present

## 2014-08-27 DIAGNOSIS — D509 Iron deficiency anemia, unspecified: Secondary | ICD-10-CM | POA: Diagnosis not present

## 2014-08-27 DIAGNOSIS — D631 Anemia in chronic kidney disease: Secondary | ICD-10-CM | POA: Diagnosis not present

## 2014-08-27 DIAGNOSIS — Z23 Encounter for immunization: Secondary | ICD-10-CM | POA: Diagnosis not present

## 2014-08-29 DIAGNOSIS — N186 End stage renal disease: Secondary | ICD-10-CM | POA: Diagnosis not present

## 2014-08-29 DIAGNOSIS — D509 Iron deficiency anemia, unspecified: Secondary | ICD-10-CM | POA: Diagnosis not present

## 2014-08-29 DIAGNOSIS — D631 Anemia in chronic kidney disease: Secondary | ICD-10-CM | POA: Diagnosis not present

## 2014-08-29 DIAGNOSIS — Z23 Encounter for immunization: Secondary | ICD-10-CM | POA: Diagnosis not present

## 2014-08-31 DIAGNOSIS — N186 End stage renal disease: Secondary | ICD-10-CM | POA: Diagnosis not present

## 2014-08-31 DIAGNOSIS — Z992 Dependence on renal dialysis: Secondary | ICD-10-CM | POA: Diagnosis not present

## 2014-09-05 DIAGNOSIS — N186 End stage renal disease: Secondary | ICD-10-CM | POA: Diagnosis not present

## 2014-09-05 DIAGNOSIS — D509 Iron deficiency anemia, unspecified: Secondary | ICD-10-CM | POA: Diagnosis not present

## 2014-09-10 DIAGNOSIS — N186 End stage renal disease: Secondary | ICD-10-CM | POA: Diagnosis not present

## 2014-09-10 DIAGNOSIS — D509 Iron deficiency anemia, unspecified: Secondary | ICD-10-CM | POA: Diagnosis not present

## 2014-09-11 DIAGNOSIS — M5136 Other intervertebral disc degeneration, lumbar region: Secondary | ICD-10-CM | POA: Diagnosis not present

## 2014-09-11 DIAGNOSIS — M47816 Spondylosis without myelopathy or radiculopathy, lumbar region: Secondary | ICD-10-CM | POA: Diagnosis not present

## 2014-09-11 DIAGNOSIS — M5416 Radiculopathy, lumbar region: Secondary | ICD-10-CM | POA: Diagnosis not present

## 2014-09-11 DIAGNOSIS — G894 Chronic pain syndrome: Secondary | ICD-10-CM | POA: Diagnosis not present

## 2014-09-16 DIAGNOSIS — F3181 Bipolar II disorder: Secondary | ICD-10-CM | POA: Diagnosis not present

## 2014-09-19 DIAGNOSIS — D509 Iron deficiency anemia, unspecified: Secondary | ICD-10-CM | POA: Diagnosis not present

## 2014-09-19 DIAGNOSIS — N186 End stage renal disease: Secondary | ICD-10-CM | POA: Diagnosis not present

## 2014-09-21 DIAGNOSIS — D509 Iron deficiency anemia, unspecified: Secondary | ICD-10-CM | POA: Diagnosis not present

## 2014-09-21 DIAGNOSIS — N186 End stage renal disease: Secondary | ICD-10-CM | POA: Diagnosis not present

## 2014-09-28 DIAGNOSIS — N186 End stage renal disease: Secondary | ICD-10-CM | POA: Diagnosis not present

## 2014-09-28 DIAGNOSIS — D509 Iron deficiency anemia, unspecified: Secondary | ICD-10-CM | POA: Diagnosis not present

## 2014-09-30 DIAGNOSIS — N186 End stage renal disease: Secondary | ICD-10-CM | POA: Diagnosis not present

## 2014-09-30 DIAGNOSIS — Z992 Dependence on renal dialysis: Secondary | ICD-10-CM | POA: Diagnosis not present

## 2014-10-03 DIAGNOSIS — D509 Iron deficiency anemia, unspecified: Secondary | ICD-10-CM | POA: Diagnosis not present

## 2014-10-03 DIAGNOSIS — N186 End stage renal disease: Secondary | ICD-10-CM | POA: Diagnosis not present

## 2014-10-03 DIAGNOSIS — D631 Anemia in chronic kidney disease: Secondary | ICD-10-CM | POA: Diagnosis not present

## 2014-10-10 ENCOUNTER — Encounter (HOSPITAL_COMMUNITY): Payer: Self-pay | Admitting: Vascular Surgery

## 2014-10-15 ENCOUNTER — Other Ambulatory Visit: Payer: Self-pay | Admitting: Nurse Practitioner

## 2014-10-15 DIAGNOSIS — D631 Anemia in chronic kidney disease: Secondary | ICD-10-CM | POA: Diagnosis not present

## 2014-10-15 DIAGNOSIS — D509 Iron deficiency anemia, unspecified: Secondary | ICD-10-CM | POA: Diagnosis not present

## 2014-10-15 DIAGNOSIS — N186 End stage renal disease: Secondary | ICD-10-CM | POA: Diagnosis not present

## 2014-10-21 DIAGNOSIS — D631 Anemia in chronic kidney disease: Secondary | ICD-10-CM | POA: Diagnosis not present

## 2014-10-21 DIAGNOSIS — N186 End stage renal disease: Secondary | ICD-10-CM | POA: Diagnosis not present

## 2014-10-21 DIAGNOSIS — D509 Iron deficiency anemia, unspecified: Secondary | ICD-10-CM | POA: Diagnosis not present

## 2014-10-22 DIAGNOSIS — N186 End stage renal disease: Secondary | ICD-10-CM | POA: Diagnosis not present

## 2014-10-22 DIAGNOSIS — D631 Anemia in chronic kidney disease: Secondary | ICD-10-CM | POA: Diagnosis not present

## 2014-10-22 DIAGNOSIS — D509 Iron deficiency anemia, unspecified: Secondary | ICD-10-CM | POA: Diagnosis not present

## 2014-10-31 DIAGNOSIS — Z992 Dependence on renal dialysis: Secondary | ICD-10-CM | POA: Diagnosis not present

## 2014-10-31 DIAGNOSIS — N186 End stage renal disease: Secondary | ICD-10-CM | POA: Diagnosis not present

## 2014-11-02 DIAGNOSIS — N186 End stage renal disease: Secondary | ICD-10-CM | POA: Diagnosis not present

## 2014-11-02 DIAGNOSIS — D509 Iron deficiency anemia, unspecified: Secondary | ICD-10-CM | POA: Diagnosis not present

## 2014-11-06 DIAGNOSIS — D509 Iron deficiency anemia, unspecified: Secondary | ICD-10-CM | POA: Diagnosis not present

## 2014-11-06 DIAGNOSIS — N186 End stage renal disease: Secondary | ICD-10-CM | POA: Diagnosis not present

## 2014-11-07 DIAGNOSIS — N186 End stage renal disease: Secondary | ICD-10-CM | POA: Diagnosis not present

## 2014-11-07 DIAGNOSIS — D509 Iron deficiency anemia, unspecified: Secondary | ICD-10-CM | POA: Diagnosis not present

## 2014-11-11 DIAGNOSIS — F3181 Bipolar II disorder: Secondary | ICD-10-CM | POA: Diagnosis not present

## 2014-11-13 DIAGNOSIS — N186 End stage renal disease: Secondary | ICD-10-CM | POA: Diagnosis not present

## 2014-11-13 DIAGNOSIS — D509 Iron deficiency anemia, unspecified: Secondary | ICD-10-CM | POA: Diagnosis not present

## 2014-11-14 DIAGNOSIS — N186 End stage renal disease: Secondary | ICD-10-CM | POA: Diagnosis not present

## 2014-11-14 DIAGNOSIS — D509 Iron deficiency anemia, unspecified: Secondary | ICD-10-CM | POA: Diagnosis not present

## 2014-11-21 DIAGNOSIS — D509 Iron deficiency anemia, unspecified: Secondary | ICD-10-CM | POA: Diagnosis not present

## 2014-11-21 DIAGNOSIS — N186 End stage renal disease: Secondary | ICD-10-CM | POA: Diagnosis not present

## 2014-11-22 DIAGNOSIS — Z79899 Other long term (current) drug therapy: Secondary | ICD-10-CM | POA: Diagnosis not present

## 2014-11-22 DIAGNOSIS — M5136 Other intervertebral disc degeneration, lumbar region: Secondary | ICD-10-CM | POA: Diagnosis not present

## 2014-11-22 DIAGNOSIS — M5416 Radiculopathy, lumbar region: Secondary | ICD-10-CM | POA: Diagnosis not present

## 2014-11-22 DIAGNOSIS — M4696 Unspecified inflammatory spondylopathy, lumbar region: Secondary | ICD-10-CM | POA: Diagnosis not present

## 2014-11-22 DIAGNOSIS — Z5181 Encounter for therapeutic drug level monitoring: Secondary | ICD-10-CM | POA: Diagnosis not present

## 2014-11-22 DIAGNOSIS — G894 Chronic pain syndrome: Secondary | ICD-10-CM | POA: Diagnosis not present

## 2014-12-01 DIAGNOSIS — N186 End stage renal disease: Secondary | ICD-10-CM | POA: Diagnosis not present

## 2014-12-01 DIAGNOSIS — Z992 Dependence on renal dialysis: Secondary | ICD-10-CM | POA: Diagnosis not present

## 2014-12-11 DIAGNOSIS — N2581 Secondary hyperparathyroidism of renal origin: Secondary | ICD-10-CM | POA: Diagnosis not present

## 2014-12-11 DIAGNOSIS — D509 Iron deficiency anemia, unspecified: Secondary | ICD-10-CM | POA: Diagnosis not present

## 2014-12-11 DIAGNOSIS — N186 End stage renal disease: Secondary | ICD-10-CM | POA: Diagnosis not present

## 2014-12-17 DIAGNOSIS — N186 End stage renal disease: Secondary | ICD-10-CM | POA: Diagnosis not present

## 2014-12-17 DIAGNOSIS — D509 Iron deficiency anemia, unspecified: Secondary | ICD-10-CM | POA: Diagnosis not present

## 2014-12-17 DIAGNOSIS — N2581 Secondary hyperparathyroidism of renal origin: Secondary | ICD-10-CM | POA: Diagnosis not present

## 2014-12-27 DIAGNOSIS — F3181 Bipolar II disorder: Secondary | ICD-10-CM | POA: Diagnosis not present

## 2014-12-28 DIAGNOSIS — N2581 Secondary hyperparathyroidism of renal origin: Secondary | ICD-10-CM | POA: Diagnosis not present

## 2014-12-28 DIAGNOSIS — D509 Iron deficiency anemia, unspecified: Secondary | ICD-10-CM | POA: Diagnosis not present

## 2014-12-28 DIAGNOSIS — N186 End stage renal disease: Secondary | ICD-10-CM | POA: Diagnosis not present

## 2014-12-30 DIAGNOSIS — M5136 Other intervertebral disc degeneration, lumbar region: Secondary | ICD-10-CM | POA: Diagnosis not present

## 2014-12-30 DIAGNOSIS — G894 Chronic pain syndrome: Secondary | ICD-10-CM | POA: Diagnosis not present

## 2014-12-30 DIAGNOSIS — M961 Postlaminectomy syndrome, not elsewhere classified: Secondary | ICD-10-CM | POA: Diagnosis not present

## 2014-12-30 DIAGNOSIS — Z992 Dependence on renal dialysis: Secondary | ICD-10-CM | POA: Diagnosis not present

## 2014-12-30 DIAGNOSIS — M5416 Radiculopathy, lumbar region: Secondary | ICD-10-CM | POA: Diagnosis not present

## 2014-12-30 DIAGNOSIS — N186 End stage renal disease: Secondary | ICD-10-CM | POA: Diagnosis not present

## 2014-12-31 DIAGNOSIS — D509 Iron deficiency anemia, unspecified: Secondary | ICD-10-CM | POA: Diagnosis not present

## 2014-12-31 DIAGNOSIS — G894 Chronic pain syndrome: Secondary | ICD-10-CM | POA: Diagnosis not present

## 2014-12-31 DIAGNOSIS — N186 End stage renal disease: Secondary | ICD-10-CM | POA: Diagnosis not present

## 2015-01-02 DIAGNOSIS — D509 Iron deficiency anemia, unspecified: Secondary | ICD-10-CM | POA: Diagnosis not present

## 2015-01-02 DIAGNOSIS — N186 End stage renal disease: Secondary | ICD-10-CM | POA: Diagnosis not present

## 2015-01-15 DIAGNOSIS — N186 End stage renal disease: Secondary | ICD-10-CM | POA: Diagnosis not present

## 2015-01-15 DIAGNOSIS — D509 Iron deficiency anemia, unspecified: Secondary | ICD-10-CM | POA: Diagnosis not present

## 2015-01-16 DIAGNOSIS — D509 Iron deficiency anemia, unspecified: Secondary | ICD-10-CM | POA: Diagnosis not present

## 2015-01-16 DIAGNOSIS — N186 End stage renal disease: Secondary | ICD-10-CM | POA: Diagnosis not present

## 2015-01-17 DIAGNOSIS — M961 Postlaminectomy syndrome, not elsewhere classified: Secondary | ICD-10-CM | POA: Diagnosis not present

## 2015-01-17 DIAGNOSIS — M5416 Radiculopathy, lumbar region: Secondary | ICD-10-CM | POA: Diagnosis not present

## 2015-01-17 DIAGNOSIS — G894 Chronic pain syndrome: Secondary | ICD-10-CM | POA: Diagnosis not present

## 2015-01-17 DIAGNOSIS — M5136 Other intervertebral disc degeneration, lumbar region: Secondary | ICD-10-CM | POA: Diagnosis not present

## 2015-01-28 DIAGNOSIS — D509 Iron deficiency anemia, unspecified: Secondary | ICD-10-CM | POA: Diagnosis not present

## 2015-01-28 DIAGNOSIS — N186 End stage renal disease: Secondary | ICD-10-CM | POA: Diagnosis not present

## 2015-01-30 DIAGNOSIS — N033 Chronic nephritic syndrome with diffuse mesangial proliferative glomerulonephritis: Secondary | ICD-10-CM | POA: Diagnosis not present

## 2015-01-30 DIAGNOSIS — N186 End stage renal disease: Secondary | ICD-10-CM | POA: Diagnosis not present

## 2015-01-30 DIAGNOSIS — Z992 Dependence on renal dialysis: Secondary | ICD-10-CM | POA: Diagnosis not present

## 2015-01-31 ENCOUNTER — Encounter (HOSPITAL_COMMUNITY): Payer: Self-pay | Admitting: *Deleted

## 2015-01-31 ENCOUNTER — Emergency Department (HOSPITAL_COMMUNITY): Payer: Medicare Other

## 2015-01-31 ENCOUNTER — Emergency Department (HOSPITAL_COMMUNITY)
Admission: EM | Admit: 2015-01-31 | Discharge: 2015-02-01 | Disposition: A | Discharge status: Home / Self Care | Payer: Medicare Other | Attending: Emergency Medicine | Admitting: Emergency Medicine

## 2015-01-31 DIAGNOSIS — F329 Major depressive disorder, single episode, unspecified: Secondary | ICD-10-CM | POA: Diagnosis not present

## 2015-01-31 DIAGNOSIS — R21 Rash and other nonspecific skin eruption: Secondary | ICD-10-CM | POA: Diagnosis present

## 2015-01-31 DIAGNOSIS — N189 Chronic kidney disease, unspecified: Secondary | ICD-10-CM | POA: Diagnosis not present

## 2015-01-31 DIAGNOSIS — J45909 Unspecified asthma, uncomplicated: Secondary | ICD-10-CM | POA: Diagnosis not present

## 2015-01-31 DIAGNOSIS — Z720 Tobacco use: Secondary | ICD-10-CM | POA: Diagnosis not present

## 2015-01-31 DIAGNOSIS — Z79899 Other long term (current) drug therapy: Secondary | ICD-10-CM | POA: Diagnosis not present

## 2015-01-31 DIAGNOSIS — E669 Obesity, unspecified: Secondary | ICD-10-CM | POA: Diagnosis not present

## 2015-01-31 DIAGNOSIS — Z862 Personal history of diseases of the blood and blood-forming organs and certain disorders involving the immune mechanism: Secondary | ICD-10-CM | POA: Insufficient documentation

## 2015-01-31 DIAGNOSIS — B379 Candidiasis, unspecified: Secondary | ICD-10-CM

## 2015-01-31 DIAGNOSIS — I129 Hypertensive chronic kidney disease with stage 1 through stage 4 chronic kidney disease, or unspecified chronic kidney disease: Secondary | ICD-10-CM | POA: Diagnosis not present

## 2015-01-31 DIAGNOSIS — R51 Headache: Secondary | ICD-10-CM | POA: Diagnosis not present

## 2015-01-31 DIAGNOSIS — F419 Anxiety disorder, unspecified: Secondary | ICD-10-CM | POA: Diagnosis not present

## 2015-01-31 DIAGNOSIS — M109 Gout, unspecified: Secondary | ICD-10-CM | POA: Insufficient documentation

## 2015-01-31 DIAGNOSIS — K219 Gastro-esophageal reflux disease without esophagitis: Secondary | ICD-10-CM | POA: Diagnosis not present

## 2015-01-31 DIAGNOSIS — R109 Unspecified abdominal pain: Secondary | ICD-10-CM | POA: Diagnosis not present

## 2015-01-31 DIAGNOSIS — R112 Nausea with vomiting, unspecified: Secondary | ICD-10-CM | POA: Insufficient documentation

## 2015-01-31 DIAGNOSIS — Z7951 Long term (current) use of inhaled steroids: Secondary | ICD-10-CM | POA: Diagnosis not present

## 2015-01-31 DIAGNOSIS — Z8669 Personal history of other diseases of the nervous system and sense organs: Secondary | ICD-10-CM | POA: Insufficient documentation

## 2015-01-31 DIAGNOSIS — M542 Cervicalgia: Secondary | ICD-10-CM | POA: Insufficient documentation

## 2015-01-31 DIAGNOSIS — R0989 Other specified symptoms and signs involving the circulatory and respiratory systems: Secondary | ICD-10-CM | POA: Diagnosis not present

## 2015-01-31 DIAGNOSIS — B3789 Other sites of candidiasis: Secondary | ICD-10-CM | POA: Diagnosis not present

## 2015-01-31 LAB — I-STAT CG4 LACTIC ACID, ED: LACTIC ACID, VENOUS: 0.68 mmol/L (ref 0.5–2.0)

## 2015-01-31 LAB — CBC WITH DIFFERENTIAL/PLATELET
BASOS ABS: 0 10*3/uL (ref 0.0–0.1)
Basophils Relative: 0 % (ref 0–1)
Eosinophils Absolute: 0 10*3/uL (ref 0.0–0.7)
Eosinophils Relative: 0 % (ref 0–5)
HCT: 31.4 % — ABNORMAL LOW (ref 36.0–46.0)
HEMOGLOBIN: 10.2 g/dL — AB (ref 12.0–15.0)
LYMPHS ABS: 1.8 10*3/uL (ref 0.7–4.0)
LYMPHS PCT: 13 % (ref 12–46)
MCH: 29.9 pg (ref 26.0–34.0)
MCHC: 32.5 g/dL (ref 30.0–36.0)
MCV: 92.1 fL (ref 78.0–100.0)
Monocytes Absolute: 0.5 10*3/uL (ref 0.1–1.0)
Monocytes Relative: 4 % (ref 3–12)
NEUTROS ABS: 11.2 10*3/uL — AB (ref 1.7–7.7)
NEUTROS PCT: 83 % — AB (ref 43–77)
PLATELETS: 197 10*3/uL (ref 150–400)
RBC: 3.41 MIL/uL — AB (ref 3.87–5.11)
RDW: 15.1 % (ref 11.5–15.5)
WBC: 13.5 10*3/uL — AB (ref 4.0–10.5)

## 2015-01-31 LAB — COMPREHENSIVE METABOLIC PANEL
ALT: 13 U/L (ref 0–35)
AST: 12 U/L (ref 0–37)
Albumin: 3.3 g/dL — ABNORMAL LOW (ref 3.5–5.2)
Alkaline Phosphatase: 91 U/L (ref 39–117)
Anion gap: 11 (ref 5–15)
BUN: 48 mg/dL — ABNORMAL HIGH (ref 6–23)
CALCIUM: 8.8 mg/dL (ref 8.4–10.5)
CHLORIDE: 106 mmol/L (ref 96–112)
CO2: 23 mmol/L (ref 19–32)
Creatinine, Ser: 8.23 mg/dL — ABNORMAL HIGH (ref 0.50–1.10)
GFR calc Af Amer: 6 mL/min — ABNORMAL LOW (ref >90)
GFR, EST NON AFRICAN AMERICAN: 5 mL/min — AB (ref >90)
Glucose, Bld: 99 mg/dL (ref 70–99)
Potassium: 3.4 mmol/L — ABNORMAL LOW (ref 3.5–5.1)
SODIUM: 140 mmol/L (ref 135–145)
Total Bilirubin: 0.8 mg/dL (ref 0.3–1.2)
Total Protein: 6 g/dL (ref 6.0–8.3)

## 2015-01-31 LAB — CBG MONITORING, ED: GLUCOSE-CAPILLARY: 91 mg/dL (ref 70–99)

## 2015-01-31 NOTE — ED Notes (Signed)
This RN took pt to the bathroom

## 2015-01-31 NOTE — ED Notes (Signed)
Patient is very vague in all of her answers, coaching has to be done for responses.

## 2015-01-31 NOTE — ED Notes (Signed)
Pt escorted self to restroom pulled call light. Myself and Tramaine answered call light pt was found to be very weak and pale in the restroom. Pt placed back in waiting room and nurse first made aware.

## 2015-01-31 NOTE — ED Notes (Signed)
This RN assisted pt into a gown.

## 2015-01-31 NOTE — ED Notes (Signed)
This RN went into the room to assess pt's wound, but pt was not in gown. Pt again instructed to change into the hospital gown.

## 2015-01-31 NOTE — ED Notes (Signed)
Patient transported to X-ray 

## 2015-01-31 NOTE — ED Notes (Signed)
Pt taken to room and instructed to change into a gown.

## 2015-01-31 NOTE — ED Notes (Signed)
Patient reports she has infection under her stomach, states it is under abdominal fold right above pubis. Pt reports cold chills.

## 2015-01-31 NOTE — ED Notes (Signed)
Pt is very unwilling to assist with her care, and reluctant to answer questions.

## 2015-01-31 NOTE — ED Provider Notes (Signed)
CSN: [id number]     Arrival date & time 01/31/15  1947 History   First MD Initiated Contact with Patient 01/31/15 2115     Chief Complaint  Patient presents with  . Wound Infection     (Consider location/radiation/quality/duration/timing/severity/associated sxs/prior Treatment) The history is provided by the patient. No language interpreter was used.  [name] is a 44 year old female with past medical history of anemia, depression, acid reflux, dizziness, anxiety, ADHD, asthma, GERD, obesity, end-stage renal disease on dialysis Tuesdays/Thursdays/Saturdays presenting to the ED with pain all over. Patient is a poor historian, had to redirect the patient numerous times. Patient reported that she's been having stomach pain that she started today, reported that the pain is localized "all over" described as a "sharp, shooting, stabbing" pain. Stated that she noticed an area of redness on her belly today. Stated that is tender to touch and that she placed Neosporin without relief. Reported that she's been having nausea and vomiting for the past 7 days reported that she threw up more than 10 times today-NB/NB. When asked if she was having diarrhea, patient reported "a little bit." Reported that she's been having cough mainly dry, reported that she's been having nasal congestion that is clear with difficulty breathing today. Reported that she noticed a headache that has progressed and gotten worse today, reported that she has history of migraines and that this headache is similar to her normal migraines. Stated that she takes Imitrex. Denied fever, sore throat, difficulty swallowing, blurred vision, sudden loss of vision, chest pain, shortness of breath, dysuria, urinary and bowel incontinence, melena, hematochezia, sick contacts, travels, leg swelling, hemoptysis, fainting. PCP none  Past Medical History  Diagnosis Date  . Anemia   . Depression   . Reflux   . Dizziness   . Anxiety   . ADD  (attention deficit disorder)   . Asthma   . Headache(784.0)   . Gout   . Anemia   . GERD (gastroesophageal reflux disease)   . Obesity   . Gastritis   . Blood transfusion 06/2011    6 units transfused at [facility]  . Arthritis     knee, wrist, back   . Bronchitis   . Shortness of breath   . Sleep apnea     does not use CPAP  . Neuromuscular disorder     carpal tunnel  . Hypertension     sees Dr. Wendie Agreste  . Chronic kidney disease     Atrophic left kidney, sees Dr. Hassell Done  . Renal insufficiency    Past Surgical History  Procedure Laterality Date  . Cesarean section    . Spine surgery  2004    Lumbar diskectomy   . Microdiskectomy  04/2001    Herniated disk L5-S1  . Renal biopsy  04/2004  . Wisdom tooth extraction    . Carpel tunnel release      x 2; bilateral  . Upper gastrointestinal endoscopy    . Colonscopy    . Dilation and curettage of uterus    . Av fistula surgery  11/30/11    [facility] - redo fistula  . Hysteroscopy with thermachoice  12/10/2011    Procedure: HYSTEROSCOPY WITH THERMACHOICE;  Surgeon: Betsy Coder, MD;  Location: [facility];  Service: Gynecology;  Laterality: N/A;  . Shuntogram  Dec. 18, 2013    Left arm  . Av fistula placement  06/10/11    Left brachiocephalic AVF  . Irrigation and debridement abscess Left 02/15/2014  Procedure: IRRIGATION AND DRAINAGE OF LOW BACK ABSCESS;  Surgeon: Imogene Burn. Georgette Dover, MD;  Location: [place];  Service: General;  Laterality: Left;  . Shuntogram N/A 10/18/2012    Procedure: Earney Mallet;  Surgeon: Conrad Berrien, MD;  Location: [facility] CATH LAB;  Service: Cardiovascular;  Laterality: N/A;  . Shuntogram Left 04/18/2013    Procedure: SHUNTOGRAM;  Surgeon: Serafina Mitchell, MD;  Location: [facility] CATH LAB;  Service: Cardiovascular;  Laterality: Left;   Family History  Problem Relation Age of Onset  . Hypertension Mother   . Heart disease Father     Heart Disease before age 68  . Hypertension Father   . Heart attack Father   .  Hypertension Daughter    History  Substance Use Topics  . Smoking status: Current Every Day Smoker -- 1.00 packs/day for 10 years    Types: Cigarettes  . Smokeless tobacco: Never Used  . Alcohol Use: Yes     Comment: ocassional   OB History    No data available     Review of Systems  Constitutional: Positive for chills. Negative for fever.  Eyes: Negative for visual disturbance.  Respiratory: Negative for chest tightness and shortness of breath.   Cardiovascular: Negative for chest pain.  Gastrointestinal: Positive for nausea, vomiting and abdominal pain. Negative for diarrhea, constipation, blood in stool and anal bleeding.  Genitourinary: Negative for dysuria and hematuria.  Musculoskeletal: Positive for neck pain. Negative for back pain and neck stiffness.  Skin: Positive for color change and rash.  Neurological: Positive for headaches. Negative for dizziness, syncope, weakness and numbness.      Allergies  Nsaids; Clarithromycin; Demerol; Dilaudid; Fentanyl; Morphine and related; Other; and Vancomycin  Home Medications   Prior to Admission medications   Medication Sig Start Date End Date Taking? Authorizing Provider  albuterol (PROVENTIL HFA;VENTOLIN HFA) 108 (90 BASE) MCG/ACT inhaler Inhale 2 puffs into the lungs every 6 (six) hours as needed. For shortness of breath    Yes Historical Provider, MD  allopurinol (ZYLOPRIM) 300 MG tablet Take 300 mg by mouth at bedtime.    Yes Historical Provider, MD  ALPRAZolam Duanne Moron) 0.5 MG tablet Take 0.5 mg by mouth 3 (three) times daily.    Yes Historical Provider, MD  amLODipine (NORVASC) 10 MG tablet Take 10 mg by mouth daily. 02/08/14  Yes Historical Provider, MD  cyclobenzaprine (FLEXERIL) 10 MG tablet Take 10 mg by mouth 2 (two) times daily as needed for muscle spasms. For muscle spasms   Yes Historical Provider, MD  DULoxetine (CYMBALTA) 60 MG capsule Take 60 mg by mouth daily.   Yes Historical Provider, MD  esomeprazole (NEXIUM)  40 MG capsule Take 40 mg by mouth daily.    Yes Historical Provider, MD  furosemide (LASIX) 80 MG tablet Take 80 mg by mouth 2 (two) times daily.    Yes Historical Provider, MD  lisinopril (PRINIVIL,ZESTRIL) 40 MG tablet Take 40 mg by mouth daily.   Yes Historical Provider, MD  metroNIDAZOLE (FLAGYL) 500 MG tablet Take 1 tablet (500 mg total) by mouth 2 (two) times daily. [id number]  Yes Delora Fuel, MD  montelukast (SINGULAIR) 10 MG tablet Take 10 mg by mouth every morning.    Yes Historical Provider, MD  multivitamin (RENA-VIT) TABS tablet Take 1 tablet by mouth daily.   Yes Historical Provider, MD  oxymorphone (OPANA ER) 30 MG 12 hr tablet Take 30 mg by mouth every 12 (twelve) hours.   Yes Historical Provider, MD  prochlorperazine (COMPAZINE)  5 MG tablet Take 5 mg by mouth daily as needed for nausea.  01/08/14  Yes Historical Provider, MD  promethazine (PHENERGAN) 25 MG tablet Take 25 mg by mouth every 6 (six) hours as needed. For nausea   Yes Historical Provider, MD  sevelamer carbonate (RENVELA) 800 MG tablet Take 800 mg by mouth 3 (three) times daily with meals.   Yes Historical Provider, MD  SUMAtriptan (IMITREX) 50 MG tablet Take 50 mg by mouth daily as needed. For migraines 02/14/14  Yes Historical Provider, MD  budesonide-formoterol (SYMBICORT) 160-4.5 MCG/ACT inhaler Inhale 2 puffs into the lungs 2 (two) times daily.    Historical Provider, MD  ciprofloxacin (CIPRO) 500 MG tablet Take 1 tablet (500 mg total) by mouth daily. On dialysis day, and take after your dialysis is completed. Patient not taking: Reported on 01/31/2015 [id number]   Delora Fuel, MD  HYDROcodone-acetaminophen [facility]) 10-325 MG per tablet Take 1 tablet by mouth every 6 (six) hours as needed for moderate pain.    Historical Provider, MD  lidocaine-prilocaine (EMLA) cream Apply 1 application topically 3 (three) times a week. For dialysis port.    Historical Provider, MD  methocarbamol (ROBAXIN) 500 MG tablet Take 500 mg by mouth 2  (two) times daily.  03/22/13   Historical Provider, MD  saccharomyces boulardii (FLORASTOR) 250 MG capsule Take 1 capsule (250 mg total) by mouth 2 (two) times daily. Patient not taking: Reported on 01/31/2015 02/17/14   Verlee Monte, MD   BP 171/90 mmHg  Pulse 81  Temp(Src) 98.7 F (37.1 C) (Oral)  Resp 16  Ht 5\' 4"  (1.626 m)  Wt 226 lb 4 oz (102.626 kg)  BMI 38.82 kg/m2  SpO2 93% Physical Exam  Constitutional: She is oriented to person, place, and time. She appears well-developed and well-nourished. No distress.  HENT:  Head: Normocephalic and atraumatic.  Mouth/Throat: Oropharynx is clear and moist. No oropharyngeal exudate.  Eyes: Conjunctivae and EOM are normal. Pupils are equal, round, and reactive to light. Right eye exhibits no discharge. Left eye exhibits no discharge.  Neck: Normal range of motion. Neck supple. No tracheal deviation present.  Cardiovascular: Normal rate, regular rhythm and normal heart sounds.  Exam reveals no friction rub.   No murmur heard. Pulmonary/Chest: Effort normal and breath sounds normal. No respiratory distress. She has no wheezes. She has no rales.  Abdominal: Soft. Bowel sounds are normal. She exhibits no distension. There is tenderness. There is no rebound and no guarding.  Obese   Erythematous rash identified just underneath the pannus-red, hot upon palpation and tender upon palpation. Extending to the lower abdomen and right groin. Appears to be yeast in presentation suggests a dermatitis. Negative areas of fluctuance or induration. Negative drainable abscess noted at this time. Negative findings of vesicles.  Musculoskeletal: Normal range of motion.  Full ROM to upper and lower extremities without difficulty noted, negative ataxia noted.  Lymphadenopathy:    She has no cervical adenopathy.  Neurological: She is alert and oriented to person, place, and time. No cranial nerve deficit. She exhibits normal muscle tone. Coordination normal.  Skin:  Skin is warm and dry. No rash noted. She is not diaphoretic. No erythema.  Psychiatric: She has a normal mood and affect. Her behavior is normal. Thought content normal.  Nursing note and vitals reviewed.   ED Course  Procedures (including critical care time)  Results for orders placed or performed during the hospital encounter of 01/31/15  CBC with Differential/Platelet  Result Value Ref  Range   WBC 13.5 (H) 4.0 - 10.5 K/uL   RBC 3.41 (L) 3.87 - 5.11 MIL/uL   Hemoglobin 10.2 (L) 12.0 - 15.0 g/dL   HCT 31.4 (L) 36.0 - 46.0 %   MCV 92.1 78.0 - 100.0 fL   MCH 29.9 26.0 - 34.0 pg   MCHC 32.5 30.0 - 36.0 g/dL   RDW 15.1 11.5 - 15.5 %   Platelets 197 150 - 400 K/uL   Neutrophils Relative % 83 (H) 43 - 77 %   Neutro Abs 11.2 (H) 1.7 - 7.7 K/uL   Lymphocytes Relative 13 12 - 46 %   Lymphs Abs 1.8 0.7 - 4.0 K/uL   Monocytes Relative 4 3 - 12 %   Monocytes Absolute 0.5 0.1 - 1.0 K/uL   Eosinophils Relative 0 0 - 5 %   Eosinophils Absolute 0.0 0.0 - 0.7 K/uL   Basophils Relative 0 0 - 1 %   Basophils Absolute 0.0 0.0 - 0.1 K/uL  Comprehensive metabolic panel  Result Value Ref Range   Sodium 140 135 - 145 mmol/L   Potassium 3.4 (L) 3.5 - 5.1 mmol/L   Chloride 106 96 - 112 mmol/L   CO2 23 19 - 32 mmol/L   Glucose, Bld 99 70 - 99 mg/dL   BUN 48 (H) 6 - 23 mg/dL   Creatinine, Ser 8.23 (H) 0.50 - 1.10 mg/dL   Calcium 8.8 8.4 - 10.5 mg/dL   Total Protein 6.0 6.0 - 8.3 g/dL   Albumin 3.3 (L) 3.5 - 5.2 g/dL   AST 12 0 - 37 U/L   ALT 13 0 - 35 U/L   Alkaline Phosphatase 91 39 - 117 U/L   Total Bilirubin 0.8 0.3 - 1.2 mg/dL   GFR calc non Af Amer 5 (L) >90 mL/min   GFR calc Af Amer 6 (L) >90 mL/min   Anion gap 11 5 - 15  Urinalysis, Routine w reflex microscopic  Result Value Ref Range   Color, Urine YELLOW YELLOW   APPearance CLEAR CLEAR   Specific Gravity, Urine 1.020 1.005 - 1.030   pH 7.0 5.0 - 8.0   Glucose, UA 100 (A) NEGATIVE mg/dL   Hgb urine dipstick SMALL (A) NEGATIVE    Bilirubin Urine NEGATIVE NEGATIVE   Ketones, ur 15 (A) NEGATIVE mg/dL   Protein, ur >300 (A) NEGATIVE mg/dL   Urobilinogen, UA 0.2 0.0 - 1.0 mg/dL   Nitrite NEGATIVE NEGATIVE   Leukocytes, UA TRACE (A) NEGATIVE  Urine microscopic-add on  Result Value Ref Range   Squamous Epithelial / LPF RARE RARE   WBC, UA 0-2 <3 WBC/hpf   RBC / HPF 3-6 <3 RBC/hpf  I-Stat CG4 Lactic Acid, ED  Result Value Ref Range   Lactic Acid, Venous 0.68 0.5 - 2.0 mmol/L  CBG monitoring, ED  Result Value Ref Range   Glucose-Capillary 91 70 - 99 mg/dL    Labs Review Labs Reviewed  CBC WITH DIFFERENTIAL/PLATELET - Abnormal; Notable for the following:    WBC 13.5 (*)    RBC 3.41 (*)    Hemoglobin 10.2 (*)    HCT 31.4 (*)    Neutrophils Relative % 83 (*)    Neutro Abs 11.2 (*)    All other components within normal limits  COMPREHENSIVE METABOLIC PANEL - Abnormal; Notable for the following:    Potassium 3.4 (*)    BUN 48 (*)    Creatinine, Ser 8.23 (*)    Albumin 3.3 (*)  GFR calc non Af Amer 5 (*)    GFR calc Af Amer 6 (*)    All other components within normal limits  URINALYSIS, ROUTINE W REFLEX MICROSCOPIC - Abnormal; Notable for the following:    Glucose, UA 100 (*)    Hgb urine dipstick SMALL (*)    Ketones, ur 15 (*)    Protein, ur >300 (*)    Leukocytes, UA TRACE (*)    All other components within normal limits  URINE MICROSCOPIC-ADD ON  I-STAT CG4 LACTIC ACID, ED  CBG MONITORING, ED    Imaging Review Dg Chest 2 View  02/01/2015   CLINICAL DATA:  Wound infection at the lower abdomen. Headache for 7 days. Initial encounter.  EXAM: CHEST  2 VIEW  COMPARISON:  Chest radiograph performed 06/27/2014  FINDINGS: The lungs are well-aerated. Vascular congestion is noted. There is no evidence of focal opacification, pleural effusion or pneumothorax.  The heart is borderline enlarged. No acute osseous abnormalities are seen.  IMPRESSION: Vascular congestion and borderline cardiomegaly; lungs remain  grossly clear.   Electronically Signed   By: Garald Balding M.D.   On: 02/01/2015 00:51     EKG Interpretation None       11:06 PM Patient seen and assessed by attending physician, Dr. Roxanne Mins, recommended and agreed to blood work. Reported that rash is most likely yeast.  12:57 AM This provider had a long discussion with attending physician, Dr. Roxanne Mins. Recommended Diflucan 150 mg to be administered - Dr. Roxanne Mins looked up that Diflucan can be used in ESRD and is dialyzed in dialysis - reported safe to use for patient. Reported that patient can be discharged home and followed up with PCP.   MDM   Final diagnoses:  Yeast infection    Medications  fluconazole (DIFLUCAN) tablet 150 mg (150 mg Oral Given 02/01/15 0125)    Filed Vitals:   02/01/15 0023 02/01/15 0030 02/01/15 0045 02/01/15 0115  BP: 173/89 175/90 166/83 171/90  Pulse: 79 83 80 81  Temp:      TempSrc:      Resp: 16     Height:      Weight:      SpO2: 94% 90% 90% 93%   CBC noted elevated white blood cell count of 13.5. Hemoglobin 12.2, hematocrit 31.4 - when compared to 7 months ago patient's hemoglobin was 12.2, patient has history of anemia has been as low as 9.1 in the past. CMP noted mild hypokalemia of 3.4. BUN of 40, creatinine 8.23 - patient is end-stage renal disease on dialysis. Glucose negative elevation-99, negative elevated anion gap. Lactic acid negative elevation. Urinalysis noted small hemoglobin with negative nitrites or trace leukocytes-negative findings of acute infection. Chest xray noted vascular congestion and borderline cardiomegaly - lungs clear.  Patient presenting to the ED with abdominal pain localized to the area of erythema underneath pannus. Patient seen and assessed by attending physician, Dr. Peggye Pitt - most likely yeast in nature. Patient given Diflucan in ED setting. Reported that patient can be discharged and followed up as an outpatient with PCP. Mild elevated WBC noted, but this appears to be a  chronic finding when compared to other labs. Negative findings of hyperglycemia or DKA - negative beginnings of DM at this time. Negative episodes of emesis in the ED setting - patient tolerated fluids and PO medications without difficulty. Doubt shingles. Doubt erythema multiform major and minor. Suspicion to be yeast infection. Patient stable, afebrile. Patient not septic appearing. Discharged patient.  Discussed with patient to rest and stay hydrated. Discussed with patient to follow up with PCP and kidney specialist. Discussed with patient to keep appointment for dialysis tomorrow. Line of demarcation made - discussed that if it goes past these lines to report back to the ED. Discussed with patient to closely monitor symptoms and if symptoms are to worsen or change to report back to the ED - strict return instructions given.  Patient agreed to plan of care, understood, all questions answered.   Jamse Mead, PA-C [id number]  Delora Fuel, MD [id number]

## 2015-02-01 LAB — URINE MICROSCOPIC-ADD ON

## 2015-02-01 LAB — URINALYSIS, ROUTINE W REFLEX MICROSCOPIC
Bilirubin Urine: NEGATIVE
Glucose, UA: 100 mg/dL — AB
KETONES UR: 15 mg/dL — AB
NITRITE: NEGATIVE
Protein, ur: >300 mg/dL — AB
Specific Gravity, Urine: 1.02 (ref 1.005–1.030)
Urobilinogen, UA: 0.2 mg/dL (ref 0.0–1.0)
pH: 7 (ref 5.0–8.0)

## 2015-02-01 MED ORDER — FLUCONAZOLE 100 MG PO TABS
150.0000 mg | ORAL_TABLET | Freq: Once | ORAL | Status: AC
  Administered 2015-02-01: 150 mg via ORAL
  Filled 2015-02-01: qty 2

## 2015-02-01 NOTE — Discharge Instructions (Signed)
Please call your doctor for a followup appointment within 24-48 hours. When you talk to your doctor please let them know that you were seen in the emergency department and have them acquire all of your records so that they can discuss the findings with you and formulate a treatment plan to fully care for your new and ongoing problems. Please call and set-up an appointment with your primary care provider  Please call and set-up an appointment with your kidney specialists If redness goes past line of demarcation please report back to the ED immediately  Please continue to monitor symptoms closely and if symptoms are to worsen or change (fever greater than 101, chills, sweating, nausea, vomiting, chest pain, shortness of breathe, difficulty breathing, weakness, numbness, tingling, worsening or changes to pain pattern, spreading, drainage, inability to keep food or fluids down, blood in the stools, black tarry stools) please report back to the Emergency Department immediately.    Candida Infection A Candida infection (also called yeast, fungus, and Monilia infection) is an overgrowth of yeast that can occur anywhere on the body. A yeast infection commonly occurs in warm, moist body areas. Usually, the infection remains localized but can spread to become a systemic infection. A yeast infection may be a sign of a more severe disease such as diabetes, leukemia, or AIDS. A yeast infection can occur in both men and women. In women, Candida vaginitis is a vaginal infection. It is one of the most common causes of vaginitis. Men usually do not have symptoms or know they have an infection until other problems develop. Men may find out they have a yeast infection because their sex partner has a yeast infection. Uncircumcised men are more likely to get a yeast infection than circumcised men. This is because the uncircumcised glans is not exposed to air and does not remain as dry as that of a circumcised glans. Older  adults may develop yeast infections around dentures. CAUSES  Women  Antibiotics.  Steroid medication taken for a long time.  Being overweight (obese).  Diabetes.  Poor immune condition.  Certain serious medical conditions.  Immune suppressive medications for organ transplant patients.  Chemotherapy.  Pregnancy.  Menstruation.  Stress and fatigue.  Intravenous drug use.  Oral contraceptives.  Wearing tight-fitting clothes in the crotch area.  Catching it from a sex partner who has a yeast infection.  Spermicide.  Intravenous, urinary, or other catheters. Men  Catching it from a sex partner who has a yeast infection.  Having oral or anal sex with a person who has the infection.  Spermicide.  Diabetes.  Antibiotics.  Poor immune system.  Medications that suppress the immune system.  Intravenous drug use.  Intravenous, urinary, or other catheters. SYMPTOMS  Women  Thick, white vaginal discharge.  Vaginal itching.  Redness and swelling in and around the vagina.  Irritation of the lips of the vagina and perineum.  Blisters on the vaginal lips and perineum.  Painful sexual intercourse.  Low blood sugar (hypoglycemia).  Painful urination.  Bladder infections.  Intestinal problems such as constipation, indigestion, bad breath, bloating, increase in gas, diarrhea, or loose stools. Men  Men may develop intestinal problems such as constipation, indigestion, bad breath, bloating, increase in gas, diarrhea, or loose stools.  Dry, cracked skin on the penis with itching or discomfort.  Jock itch.  Dry, flaky skin.  Athlete's foot.  Hypoglycemia. DIAGNOSIS  Women  A history and an exam are performed.  The discharge may be examined under a  microscope.  A culture may be taken of the discharge. Men  A history and an exam are performed.  Any discharge from the penis or areas of cracked skin will be looked at under the microscope and  cultured.  Stool samples may be cultured. TREATMENT  Women  Vaginal antifungal suppositories and creams.  Medicated creams to decrease irritation and itching on the outside of the vagina.  Warm compresses to the perineal area to decrease swelling and discomfort.  Oral antifungal medications.  Medicated vaginal suppositories or cream for repeated or recurrent infections.  Wash and dry the irritation areas before applying the cream.  Eating yogurt with Lactobacillus may help with prevention and treatment.  Sometimes painting the vagina with gentian violet solution may help if creams and suppositories do not work. Men  Antifungal creams and oral antifungal medications.  Sometimes treatment must continue for 30 days after the symptoms go away to prevent recurrence. HOME CARE INSTRUCTIONS  Women  Use cotton underwear and avoid tight-fitting clothing.  Avoid colored, scented toilet paper and deodorant tampons or pads.  Do not douche.  Keep your diabetes under control.  Finish all the prescribed medications.  Keep your skin clean and dry.  Consume milk or yogurt with Lactobacillus-active culture regularly. If you get frequent yeast infections and think that is what the infection is, there are over-the-counter medications that you can get. If the infection does not show healing in 3 days, talk to your caregiver.  Tell your sex partner you have a yeast infection. Your partner may need treatment also, especially if your infection does not clear up or recurs. Men  Keep your skin clean and dry.  Keep your diabetes under control.  Finish all prescribed medications.  Tell your sex partner that you have a yeast infection so he or she can be treated if necessary. SEEK MEDICAL CARE IF:   Your symptoms do not clear up or worsen in one week after treatment.  You have an oral temperature above 102 F (38.9 C).  You have trouble swallowing or eating for a prolonged time.  You  develop blisters on and around your vagina.  You develop vaginal bleeding and it is not your menstrual period.  You develop abdominal pain.  You develop intestinal problems as mentioned above.  You get weak or light-headed.  You have painful or increased urination.  You have pain during sexual intercourse. MAKE SURE YOU:   Understand these instructions.  Will watch your condition.  Will get help right away if you are not doing well or get worse. Document Released: 11/25/2004 Document Revised: 03/04/2014 Document Reviewed: 03/09/2010 [employer] Patient Information 2015 [place], [place]. This information is not intended to replace advice given to you by your health care provider. Make sure you discuss any questions you have with your health care provider.

## 2015-02-06 DIAGNOSIS — N186 End stage renal disease: Secondary | ICD-10-CM | POA: Diagnosis not present

## 2015-02-06 DIAGNOSIS — D631 Anemia in chronic kidney disease: Secondary | ICD-10-CM | POA: Diagnosis not present

## 2015-02-06 DIAGNOSIS — D509 Iron deficiency anemia, unspecified: Secondary | ICD-10-CM | POA: Diagnosis not present

## 2015-02-11 DIAGNOSIS — D631 Anemia in chronic kidney disease: Secondary | ICD-10-CM | POA: Diagnosis not present

## 2015-02-11 DIAGNOSIS — D509 Iron deficiency anemia, unspecified: Secondary | ICD-10-CM | POA: Diagnosis not present

## 2015-02-11 DIAGNOSIS — N186 End stage renal disease: Secondary | ICD-10-CM | POA: Diagnosis not present

## 2015-02-13 DIAGNOSIS — N186 End stage renal disease: Secondary | ICD-10-CM | POA: Diagnosis not present

## 2015-02-13 DIAGNOSIS — D631 Anemia in chronic kidney disease: Secondary | ICD-10-CM | POA: Diagnosis not present

## 2015-02-13 DIAGNOSIS — D509 Iron deficiency anemia, unspecified: Secondary | ICD-10-CM | POA: Diagnosis not present

## 2015-02-14 DIAGNOSIS — G894 Chronic pain syndrome: Secondary | ICD-10-CM | POA: Diagnosis not present

## 2015-02-14 DIAGNOSIS — M961 Postlaminectomy syndrome, not elsewhere classified: Secondary | ICD-10-CM | POA: Diagnosis not present

## 2015-02-14 DIAGNOSIS — M5416 Radiculopathy, lumbar region: Secondary | ICD-10-CM | POA: Diagnosis not present

## 2015-02-14 DIAGNOSIS — M5136 Other intervertebral disc degeneration, lumbar region: Secondary | ICD-10-CM | POA: Diagnosis not present

## 2015-02-19 DIAGNOSIS — L02818 Cutaneous abscess of other sites: Secondary | ICD-10-CM | POA: Diagnosis not present

## 2015-02-19 DIAGNOSIS — D492 Neoplasm of unspecified behavior of bone, soft tissue, and skin: Secondary | ICD-10-CM | POA: Diagnosis not present

## 2015-02-24 DIAGNOSIS — L0291 Cutaneous abscess, unspecified: Secondary | ICD-10-CM | POA: Diagnosis not present

## 2015-02-24 DIAGNOSIS — L723 Sebaceous cyst: Secondary | ICD-10-CM | POA: Diagnosis not present

## 2015-02-24 DIAGNOSIS — F17200 Nicotine dependence, unspecified, uncomplicated: Secondary | ICD-10-CM | POA: Diagnosis not present

## 2015-02-24 DIAGNOSIS — N924 Excessive bleeding in the premenopausal period: Secondary | ICD-10-CM | POA: Diagnosis not present

## 2015-02-25 DIAGNOSIS — N186 End stage renal disease: Secondary | ICD-10-CM | POA: Diagnosis not present

## 2015-02-25 DIAGNOSIS — D631 Anemia in chronic kidney disease: Secondary | ICD-10-CM | POA: Diagnosis not present

## 2015-02-25 DIAGNOSIS — D509 Iron deficiency anemia, unspecified: Secondary | ICD-10-CM | POA: Diagnosis not present

## 2015-03-01 DIAGNOSIS — N033 Chronic nephritic syndrome with diffuse mesangial proliferative glomerulonephritis: Secondary | ICD-10-CM | POA: Diagnosis not present

## 2015-03-01 DIAGNOSIS — N186 End stage renal disease: Secondary | ICD-10-CM | POA: Diagnosis not present

## 2015-03-01 DIAGNOSIS — Z992 Dependence on renal dialysis: Secondary | ICD-10-CM | POA: Diagnosis not present

## 2015-03-11 DIAGNOSIS — D509 Iron deficiency anemia, unspecified: Secondary | ICD-10-CM | POA: Diagnosis not present

## 2015-03-11 DIAGNOSIS — D631 Anemia in chronic kidney disease: Secondary | ICD-10-CM | POA: Diagnosis not present

## 2015-03-11 DIAGNOSIS — D729 Disorder of white blood cells, unspecified: Secondary | ICD-10-CM | POA: Diagnosis not present

## 2015-03-11 DIAGNOSIS — N186 End stage renal disease: Secondary | ICD-10-CM | POA: Diagnosis not present

## 2015-03-13 DIAGNOSIS — E8770 Fluid overload, unspecified: Secondary | ICD-10-CM | POA: Diagnosis not present

## 2015-03-13 DIAGNOSIS — E8779 Other fluid overload: Secondary | ICD-10-CM | POA: Diagnosis not present

## 2015-03-13 DIAGNOSIS — R0602 Shortness of breath: Secondary | ICD-10-CM | POA: Diagnosis not present

## 2015-03-13 DIAGNOSIS — R0902 Hypoxemia: Secondary | ICD-10-CM | POA: Diagnosis not present

## 2015-03-13 DIAGNOSIS — R079 Chest pain, unspecified: Secondary | ICD-10-CM | POA: Diagnosis not present

## 2015-03-13 DIAGNOSIS — N189 Chronic kidney disease, unspecified: Secondary | ICD-10-CM | POA: Diagnosis not present

## 2015-03-14 DIAGNOSIS — F17210 Nicotine dependence, cigarettes, uncomplicated: Secondary | ICD-10-CM | POA: Diagnosis not present

## 2015-03-14 DIAGNOSIS — Z7951 Long term (current) use of inhaled steroids: Secondary | ICD-10-CM | POA: Diagnosis not present

## 2015-03-14 DIAGNOSIS — N185 Chronic kidney disease, stage 5: Secondary | ICD-10-CM | POA: Diagnosis not present

## 2015-03-14 DIAGNOSIS — Z881 Allergy status to other antibiotic agents status: Secondary | ICD-10-CM | POA: Diagnosis not present

## 2015-03-14 DIAGNOSIS — J45909 Unspecified asthma, uncomplicated: Secondary | ICD-10-CM | POA: Diagnosis not present

## 2015-03-14 DIAGNOSIS — F988 Other specified behavioral and emotional disorders with onset usually occurring in childhood and adolescence: Secondary | ICD-10-CM | POA: Diagnosis not present

## 2015-03-14 DIAGNOSIS — I120 Hypertensive chronic kidney disease with stage 5 chronic kidney disease or end stage renal disease: Secondary | ICD-10-CM | POA: Diagnosis not present

## 2015-03-14 DIAGNOSIS — M5417 Radiculopathy, lumbosacral region: Secondary | ICD-10-CM | POA: Diagnosis not present

## 2015-03-14 DIAGNOSIS — Z79891 Long term (current) use of opiate analgesic: Secondary | ICD-10-CM | POA: Diagnosis not present

## 2015-03-14 DIAGNOSIS — Z886 Allergy status to analgesic agent status: Secondary | ICD-10-CM | POA: Diagnosis not present

## 2015-03-14 DIAGNOSIS — M5136 Other intervertebral disc degeneration, lumbar region: Secondary | ICD-10-CM | POA: Diagnosis not present

## 2015-03-14 DIAGNOSIS — G5700 Lesion of sciatic nerve, unspecified lower limb: Secondary | ICD-10-CM | POA: Diagnosis not present

## 2015-03-14 DIAGNOSIS — Z79899 Other long term (current) drug therapy: Secondary | ICD-10-CM | POA: Diagnosis not present

## 2015-03-14 DIAGNOSIS — G894 Chronic pain syndrome: Secondary | ICD-10-CM | POA: Diagnosis not present

## 2015-03-14 DIAGNOSIS — Z885 Allergy status to narcotic agent status: Secondary | ICD-10-CM | POA: Diagnosis not present

## 2015-03-15 DIAGNOSIS — D631 Anemia in chronic kidney disease: Secondary | ICD-10-CM | POA: Diagnosis not present

## 2015-03-15 DIAGNOSIS — N186 End stage renal disease: Secondary | ICD-10-CM | POA: Diagnosis not present

## 2015-03-15 DIAGNOSIS — D729 Disorder of white blood cells, unspecified: Secondary | ICD-10-CM | POA: Diagnosis not present

## 2015-03-15 DIAGNOSIS — D509 Iron deficiency anemia, unspecified: Secondary | ICD-10-CM | POA: Diagnosis not present

## 2015-03-18 DIAGNOSIS — D729 Disorder of white blood cells, unspecified: Secondary | ICD-10-CM | POA: Diagnosis not present

## 2015-03-18 DIAGNOSIS — D509 Iron deficiency anemia, unspecified: Secondary | ICD-10-CM | POA: Diagnosis not present

## 2015-03-18 DIAGNOSIS — D631 Anemia in chronic kidney disease: Secondary | ICD-10-CM | POA: Diagnosis not present

## 2015-03-18 DIAGNOSIS — N186 End stage renal disease: Secondary | ICD-10-CM | POA: Diagnosis not present

## 2015-03-20 DIAGNOSIS — D729 Disorder of white blood cells, unspecified: Secondary | ICD-10-CM | POA: Diagnosis not present

## 2015-03-20 DIAGNOSIS — D631 Anemia in chronic kidney disease: Secondary | ICD-10-CM | POA: Diagnosis not present

## 2015-03-20 DIAGNOSIS — N186 End stage renal disease: Secondary | ICD-10-CM | POA: Diagnosis not present

## 2015-03-20 DIAGNOSIS — D509 Iron deficiency anemia, unspecified: Secondary | ICD-10-CM | POA: Diagnosis not present

## 2015-03-24 DIAGNOSIS — I10 Essential (primary) hypertension: Secondary | ICD-10-CM | POA: Diagnosis not present

## 2015-03-24 DIAGNOSIS — Z1239 Encounter for other screening for malignant neoplasm of breast: Secondary | ICD-10-CM | POA: Diagnosis not present

## 2015-03-24 DIAGNOSIS — Z0000 Encounter for general adult medical examination without abnormal findings: Secondary | ICD-10-CM | POA: Diagnosis not present

## 2015-03-24 DIAGNOSIS — J301 Allergic rhinitis due to pollen: Secondary | ICD-10-CM | POA: Diagnosis not present

## 2015-03-24 DIAGNOSIS — N924 Excessive bleeding in the premenopausal period: Secondary | ICD-10-CM | POA: Diagnosis not present

## 2015-03-24 DIAGNOSIS — K219 Gastro-esophageal reflux disease without esophagitis: Secondary | ICD-10-CM | POA: Diagnosis not present

## 2015-03-24 DIAGNOSIS — F17200 Nicotine dependence, unspecified, uncomplicated: Secondary | ICD-10-CM | POA: Diagnosis not present

## 2015-03-24 DIAGNOSIS — J4520 Mild intermittent asthma, uncomplicated: Secondary | ICD-10-CM | POA: Diagnosis not present

## 2015-03-24 DIAGNOSIS — F329 Major depressive disorder, single episode, unspecified: Secondary | ICD-10-CM | POA: Diagnosis not present

## 2015-03-27 DIAGNOSIS — D509 Iron deficiency anemia, unspecified: Secondary | ICD-10-CM | POA: Diagnosis not present

## 2015-03-27 DIAGNOSIS — D729 Disorder of white blood cells, unspecified: Secondary | ICD-10-CM | POA: Diagnosis not present

## 2015-03-27 DIAGNOSIS — D631 Anemia in chronic kidney disease: Secondary | ICD-10-CM | POA: Diagnosis not present

## 2015-03-27 DIAGNOSIS — N186 End stage renal disease: Secondary | ICD-10-CM | POA: Diagnosis not present

## 2015-04-01 DIAGNOSIS — N033 Chronic nephritic syndrome with diffuse mesangial proliferative glomerulonephritis: Secondary | ICD-10-CM | POA: Diagnosis not present

## 2015-04-01 DIAGNOSIS — D509 Iron deficiency anemia, unspecified: Secondary | ICD-10-CM | POA: Diagnosis not present

## 2015-04-01 DIAGNOSIS — N186 End stage renal disease: Secondary | ICD-10-CM | POA: Diagnosis not present

## 2015-04-01 DIAGNOSIS — D631 Anemia in chronic kidney disease: Secondary | ICD-10-CM | POA: Diagnosis not present

## 2015-04-01 DIAGNOSIS — Z992 Dependence on renal dialysis: Secondary | ICD-10-CM | POA: Diagnosis not present

## 2015-04-01 DIAGNOSIS — D729 Disorder of white blood cells, unspecified: Secondary | ICD-10-CM | POA: Diagnosis not present

## 2015-04-03 DIAGNOSIS — D631 Anemia in chronic kidney disease: Secondary | ICD-10-CM | POA: Diagnosis not present

## 2015-04-03 DIAGNOSIS — D509 Iron deficiency anemia, unspecified: Secondary | ICD-10-CM | POA: Diagnosis not present

## 2015-04-03 DIAGNOSIS — N186 End stage renal disease: Secondary | ICD-10-CM | POA: Diagnosis not present

## 2015-04-08 DIAGNOSIS — N186 End stage renal disease: Secondary | ICD-10-CM | POA: Diagnosis not present

## 2015-04-08 DIAGNOSIS — D509 Iron deficiency anemia, unspecified: Secondary | ICD-10-CM | POA: Diagnosis not present

## 2015-04-08 DIAGNOSIS — D631 Anemia in chronic kidney disease: Secondary | ICD-10-CM | POA: Diagnosis not present

## 2015-04-17 DIAGNOSIS — D509 Iron deficiency anemia, unspecified: Secondary | ICD-10-CM | POA: Diagnosis not present

## 2015-04-17 DIAGNOSIS — D631 Anemia in chronic kidney disease: Secondary | ICD-10-CM | POA: Diagnosis not present

## 2015-04-17 DIAGNOSIS — N186 End stage renal disease: Secondary | ICD-10-CM | POA: Diagnosis not present

## 2015-04-18 DIAGNOSIS — M791 Myalgia: Secondary | ICD-10-CM | POA: Diagnosis not present

## 2015-04-18 DIAGNOSIS — G894 Chronic pain syndrome: Secondary | ICD-10-CM | POA: Diagnosis not present

## 2015-04-18 DIAGNOSIS — M961 Postlaminectomy syndrome, not elsewhere classified: Secondary | ICD-10-CM | POA: Diagnosis not present

## 2015-04-18 DIAGNOSIS — Z6841 Body Mass Index (BMI) 40.0 and over, adult: Secondary | ICD-10-CM | POA: Diagnosis not present

## 2015-04-22 DIAGNOSIS — N186 End stage renal disease: Secondary | ICD-10-CM | POA: Diagnosis not present

## 2015-04-22 DIAGNOSIS — D509 Iron deficiency anemia, unspecified: Secondary | ICD-10-CM | POA: Diagnosis not present

## 2015-04-22 DIAGNOSIS — D631 Anemia in chronic kidney disease: Secondary | ICD-10-CM | POA: Diagnosis not present

## 2015-04-25 DIAGNOSIS — D631 Anemia in chronic kidney disease: Secondary | ICD-10-CM | POA: Diagnosis not present

## 2015-04-25 DIAGNOSIS — D509 Iron deficiency anemia, unspecified: Secondary | ICD-10-CM | POA: Diagnosis not present

## 2015-04-25 DIAGNOSIS — N186 End stage renal disease: Secondary | ICD-10-CM | POA: Diagnosis not present

## 2015-04-28 DIAGNOSIS — I10 Essential (primary) hypertension: Secondary | ICD-10-CM | POA: Diagnosis not present

## 2015-04-28 DIAGNOSIS — J301 Allergic rhinitis due to pollen: Secondary | ICD-10-CM | POA: Diagnosis not present

## 2015-04-28 DIAGNOSIS — J4520 Mild intermittent asthma, uncomplicated: Secondary | ICD-10-CM | POA: Diagnosis not present

## 2015-04-29 DIAGNOSIS — N186 End stage renal disease: Secondary | ICD-10-CM | POA: Diagnosis not present

## 2015-04-29 DIAGNOSIS — D509 Iron deficiency anemia, unspecified: Secondary | ICD-10-CM | POA: Diagnosis not present

## 2015-04-29 DIAGNOSIS — D631 Anemia in chronic kidney disease: Secondary | ICD-10-CM | POA: Diagnosis not present

## 2015-05-01 DIAGNOSIS — D631 Anemia in chronic kidney disease: Secondary | ICD-10-CM | POA: Diagnosis not present

## 2015-05-01 DIAGNOSIS — N033 Chronic nephritic syndrome with diffuse mesangial proliferative glomerulonephritis: Secondary | ICD-10-CM | POA: Diagnosis not present

## 2015-05-01 DIAGNOSIS — N186 End stage renal disease: Secondary | ICD-10-CM | POA: Diagnosis not present

## 2015-05-01 DIAGNOSIS — D509 Iron deficiency anemia, unspecified: Secondary | ICD-10-CM | POA: Diagnosis not present

## 2015-05-01 DIAGNOSIS — Z992 Dependence on renal dialysis: Secondary | ICD-10-CM | POA: Diagnosis not present

## 2015-05-03 DIAGNOSIS — N186 End stage renal disease: Secondary | ICD-10-CM | POA: Diagnosis not present

## 2015-05-03 DIAGNOSIS — D631 Anemia in chronic kidney disease: Secondary | ICD-10-CM | POA: Diagnosis not present

## 2015-05-03 DIAGNOSIS — D509 Iron deficiency anemia, unspecified: Secondary | ICD-10-CM | POA: Diagnosis not present

## 2015-05-06 DIAGNOSIS — D631 Anemia in chronic kidney disease: Secondary | ICD-10-CM | POA: Diagnosis not present

## 2015-05-06 DIAGNOSIS — N186 End stage renal disease: Secondary | ICD-10-CM | POA: Diagnosis not present

## 2015-05-06 DIAGNOSIS — D509 Iron deficiency anemia, unspecified: Secondary | ICD-10-CM | POA: Diagnosis not present

## 2015-05-10 DIAGNOSIS — D631 Anemia in chronic kidney disease: Secondary | ICD-10-CM | POA: Diagnosis not present

## 2015-05-10 DIAGNOSIS — D509 Iron deficiency anemia, unspecified: Secondary | ICD-10-CM | POA: Diagnosis not present

## 2015-05-10 DIAGNOSIS — N186 End stage renal disease: Secondary | ICD-10-CM | POA: Diagnosis not present

## 2015-05-15 DIAGNOSIS — D509 Iron deficiency anemia, unspecified: Secondary | ICD-10-CM | POA: Diagnosis not present

## 2015-05-15 DIAGNOSIS — D631 Anemia in chronic kidney disease: Secondary | ICD-10-CM | POA: Diagnosis not present

## 2015-05-15 DIAGNOSIS — N186 End stage renal disease: Secondary | ICD-10-CM | POA: Diagnosis not present

## 2015-05-16 DIAGNOSIS — D631 Anemia in chronic kidney disease: Secondary | ICD-10-CM | POA: Diagnosis not present

## 2015-05-16 DIAGNOSIS — N186 End stage renal disease: Secondary | ICD-10-CM | POA: Diagnosis not present

## 2015-05-16 DIAGNOSIS — D509 Iron deficiency anemia, unspecified: Secondary | ICD-10-CM | POA: Diagnosis not present

## 2015-05-23 DIAGNOSIS — I120 Hypertensive chronic kidney disease with stage 5 chronic kidney disease or end stage renal disease: Secondary | ICD-10-CM | POA: Diagnosis not present

## 2015-05-23 DIAGNOSIS — M5136 Other intervertebral disc degeneration, lumbar region: Secondary | ICD-10-CM | POA: Diagnosis not present

## 2015-05-23 DIAGNOSIS — Z886 Allergy status to analgesic agent status: Secondary | ICD-10-CM | POA: Diagnosis not present

## 2015-05-23 DIAGNOSIS — F17210 Nicotine dependence, cigarettes, uncomplicated: Secondary | ICD-10-CM | POA: Diagnosis not present

## 2015-05-23 DIAGNOSIS — G894 Chronic pain syndrome: Secondary | ICD-10-CM | POA: Diagnosis not present

## 2015-05-23 DIAGNOSIS — M5417 Radiculopathy, lumbosacral region: Secondary | ICD-10-CM | POA: Diagnosis not present

## 2015-05-23 DIAGNOSIS — Z6838 Body mass index (BMI) 38.0-38.9, adult: Secondary | ICD-10-CM | POA: Diagnosis not present

## 2015-05-23 DIAGNOSIS — J45909 Unspecified asthma, uncomplicated: Secondary | ICD-10-CM | POA: Diagnosis not present

## 2015-05-23 DIAGNOSIS — Z885 Allergy status to narcotic agent status: Secondary | ICD-10-CM | POA: Diagnosis not present

## 2015-05-23 DIAGNOSIS — G4733 Obstructive sleep apnea (adult) (pediatric): Secondary | ICD-10-CM | POA: Diagnosis not present

## 2015-05-23 DIAGNOSIS — Z7951 Long term (current) use of inhaled steroids: Secondary | ICD-10-CM | POA: Diagnosis not present

## 2015-05-23 DIAGNOSIS — Z79899 Other long term (current) drug therapy: Secondary | ICD-10-CM | POA: Diagnosis not present

## 2015-05-23 DIAGNOSIS — N185 Chronic kidney disease, stage 5: Secondary | ICD-10-CM | POA: Diagnosis not present

## 2015-05-23 DIAGNOSIS — Z881 Allergy status to other antibiotic agents status: Secondary | ICD-10-CM | POA: Diagnosis not present

## 2015-05-24 DIAGNOSIS — D509 Iron deficiency anemia, unspecified: Secondary | ICD-10-CM | POA: Diagnosis not present

## 2015-05-24 DIAGNOSIS — N186 End stage renal disease: Secondary | ICD-10-CM | POA: Diagnosis not present

## 2015-05-24 DIAGNOSIS — D631 Anemia in chronic kidney disease: Secondary | ICD-10-CM | POA: Diagnosis not present

## 2015-05-28 DIAGNOSIS — D631 Anemia in chronic kidney disease: Secondary | ICD-10-CM | POA: Diagnosis not present

## 2015-05-28 DIAGNOSIS — N186 End stage renal disease: Secondary | ICD-10-CM | POA: Diagnosis not present

## 2015-05-28 DIAGNOSIS — D509 Iron deficiency anemia, unspecified: Secondary | ICD-10-CM | POA: Diagnosis not present

## 2015-06-01 DIAGNOSIS — Z992 Dependence on renal dialysis: Secondary | ICD-10-CM | POA: Diagnosis not present

## 2015-06-01 DIAGNOSIS — N186 End stage renal disease: Secondary | ICD-10-CM | POA: Diagnosis not present

## 2015-06-01 DIAGNOSIS — N033 Chronic nephritic syndrome with diffuse mesangial proliferative glomerulonephritis: Secondary | ICD-10-CM | POA: Diagnosis not present

## 2015-06-03 DIAGNOSIS — N2581 Secondary hyperparathyroidism of renal origin: Secondary | ICD-10-CM | POA: Diagnosis not present

## 2015-06-03 DIAGNOSIS — D509 Iron deficiency anemia, unspecified: Secondary | ICD-10-CM | POA: Diagnosis not present

## 2015-06-03 DIAGNOSIS — N186 End stage renal disease: Secondary | ICD-10-CM | POA: Diagnosis not present

## 2015-06-03 DIAGNOSIS — D631 Anemia in chronic kidney disease: Secondary | ICD-10-CM | POA: Diagnosis not present

## 2015-06-05 DIAGNOSIS — Z22322 Carrier or suspected carrier of Methicillin resistant Staphylococcus aureus: Secondary | ICD-10-CM | POA: Diagnosis not present

## 2015-06-05 DIAGNOSIS — N186 End stage renal disease: Secondary | ICD-10-CM | POA: Diagnosis not present

## 2015-06-05 DIAGNOSIS — D509 Iron deficiency anemia, unspecified: Secondary | ICD-10-CM | POA: Diagnosis not present

## 2015-06-05 DIAGNOSIS — D631 Anemia in chronic kidney disease: Secondary | ICD-10-CM | POA: Diagnosis not present

## 2015-06-05 DIAGNOSIS — N2581 Secondary hyperparathyroidism of renal origin: Secondary | ICD-10-CM | POA: Diagnosis not present

## 2015-06-06 DIAGNOSIS — E780 Pure hypercholesterolemia: Secondary | ICD-10-CM | POA: Diagnosis not present

## 2015-06-06 DIAGNOSIS — Z79899 Other long term (current) drug therapy: Secondary | ICD-10-CM | POA: Diagnosis not present

## 2015-06-06 DIAGNOSIS — J4520 Mild intermittent asthma, uncomplicated: Secondary | ICD-10-CM | POA: Diagnosis not present

## 2015-06-06 DIAGNOSIS — J449 Chronic obstructive pulmonary disease, unspecified: Secondary | ICD-10-CM | POA: Diagnosis not present

## 2015-06-06 DIAGNOSIS — Z79891 Long term (current) use of opiate analgesic: Secondary | ICD-10-CM | POA: Diagnosis not present

## 2015-06-06 DIAGNOSIS — I10 Essential (primary) hypertension: Secondary | ICD-10-CM | POA: Diagnosis not present

## 2015-06-07 DIAGNOSIS — D631 Anemia in chronic kidney disease: Secondary | ICD-10-CM | POA: Diagnosis not present

## 2015-06-07 DIAGNOSIS — N186 End stage renal disease: Secondary | ICD-10-CM | POA: Diagnosis not present

## 2015-06-07 DIAGNOSIS — D509 Iron deficiency anemia, unspecified: Secondary | ICD-10-CM | POA: Diagnosis not present

## 2015-06-07 DIAGNOSIS — N2581 Secondary hyperparathyroidism of renal origin: Secondary | ICD-10-CM | POA: Diagnosis not present

## 2015-06-10 DIAGNOSIS — N186 End stage renal disease: Secondary | ICD-10-CM | POA: Diagnosis not present

## 2015-06-10 DIAGNOSIS — N2581 Secondary hyperparathyroidism of renal origin: Secondary | ICD-10-CM | POA: Diagnosis not present

## 2015-06-10 DIAGNOSIS — D631 Anemia in chronic kidney disease: Secondary | ICD-10-CM | POA: Diagnosis not present

## 2015-06-10 DIAGNOSIS — D509 Iron deficiency anemia, unspecified: Secondary | ICD-10-CM | POA: Diagnosis not present

## 2015-06-11 DIAGNOSIS — M961 Postlaminectomy syndrome, not elsewhere classified: Secondary | ICD-10-CM | POA: Diagnosis not present

## 2015-06-11 DIAGNOSIS — M791 Myalgia: Secondary | ICD-10-CM | POA: Diagnosis not present

## 2015-06-11 DIAGNOSIS — G894 Chronic pain syndrome: Secondary | ICD-10-CM | POA: Diagnosis not present

## 2015-06-11 DIAGNOSIS — M5416 Radiculopathy, lumbar region: Secondary | ICD-10-CM | POA: Diagnosis not present

## 2015-06-17 DIAGNOSIS — N186 End stage renal disease: Secondary | ICD-10-CM | POA: Diagnosis not present

## 2015-06-17 DIAGNOSIS — D509 Iron deficiency anemia, unspecified: Secondary | ICD-10-CM | POA: Diagnosis not present

## 2015-06-17 DIAGNOSIS — D631 Anemia in chronic kidney disease: Secondary | ICD-10-CM | POA: Diagnosis not present

## 2015-06-17 DIAGNOSIS — N2581 Secondary hyperparathyroidism of renal origin: Secondary | ICD-10-CM | POA: Diagnosis not present

## 2015-06-21 DIAGNOSIS — N186 End stage renal disease: Secondary | ICD-10-CM | POA: Diagnosis not present

## 2015-06-21 DIAGNOSIS — D509 Iron deficiency anemia, unspecified: Secondary | ICD-10-CM | POA: Diagnosis not present

## 2015-06-21 DIAGNOSIS — N2581 Secondary hyperparathyroidism of renal origin: Secondary | ICD-10-CM | POA: Diagnosis not present

## 2015-06-21 DIAGNOSIS — D631 Anemia in chronic kidney disease: Secondary | ICD-10-CM | POA: Diagnosis not present

## 2015-06-24 DIAGNOSIS — D631 Anemia in chronic kidney disease: Secondary | ICD-10-CM | POA: Diagnosis not present

## 2015-06-24 DIAGNOSIS — N186 End stage renal disease: Secondary | ICD-10-CM | POA: Diagnosis not present

## 2015-06-24 DIAGNOSIS — D509 Iron deficiency anemia, unspecified: Secondary | ICD-10-CM | POA: Diagnosis not present

## 2015-06-24 DIAGNOSIS — N2581 Secondary hyperparathyroidism of renal origin: Secondary | ICD-10-CM | POA: Diagnosis not present

## 2015-06-26 DIAGNOSIS — N2581 Secondary hyperparathyroidism of renal origin: Secondary | ICD-10-CM | POA: Diagnosis not present

## 2015-06-26 DIAGNOSIS — N186 End stage renal disease: Secondary | ICD-10-CM | POA: Diagnosis not present

## 2015-06-26 DIAGNOSIS — D631 Anemia in chronic kidney disease: Secondary | ICD-10-CM | POA: Diagnosis not present

## 2015-06-26 DIAGNOSIS — D509 Iron deficiency anemia, unspecified: Secondary | ICD-10-CM | POA: Diagnosis not present

## 2015-07-02 DIAGNOSIS — N033 Chronic nephritic syndrome with diffuse mesangial proliferative glomerulonephritis: Secondary | ICD-10-CM | POA: Diagnosis not present

## 2015-07-02 DIAGNOSIS — Z992 Dependence on renal dialysis: Secondary | ICD-10-CM | POA: Diagnosis not present

## 2015-07-02 DIAGNOSIS — N186 End stage renal disease: Secondary | ICD-10-CM | POA: Diagnosis not present

## 2015-07-03 DIAGNOSIS — D631 Anemia in chronic kidney disease: Secondary | ICD-10-CM | POA: Diagnosis not present

## 2015-07-03 DIAGNOSIS — N2581 Secondary hyperparathyroidism of renal origin: Secondary | ICD-10-CM | POA: Diagnosis not present

## 2015-07-03 DIAGNOSIS — N186 End stage renal disease: Secondary | ICD-10-CM | POA: Diagnosis not present

## 2015-07-03 DIAGNOSIS — D509 Iron deficiency anemia, unspecified: Secondary | ICD-10-CM | POA: Diagnosis not present

## 2015-07-08 DIAGNOSIS — N2581 Secondary hyperparathyroidism of renal origin: Secondary | ICD-10-CM | POA: Diagnosis not present

## 2015-07-08 DIAGNOSIS — D631 Anemia in chronic kidney disease: Secondary | ICD-10-CM | POA: Diagnosis not present

## 2015-07-08 DIAGNOSIS — D509 Iron deficiency anemia, unspecified: Secondary | ICD-10-CM | POA: Diagnosis not present

## 2015-07-08 DIAGNOSIS — N186 End stage renal disease: Secondary | ICD-10-CM | POA: Diagnosis not present

## 2015-07-10 DIAGNOSIS — D631 Anemia in chronic kidney disease: Secondary | ICD-10-CM | POA: Diagnosis not present

## 2015-07-10 DIAGNOSIS — N186 End stage renal disease: Secondary | ICD-10-CM | POA: Diagnosis not present

## 2015-07-10 DIAGNOSIS — D509 Iron deficiency anemia, unspecified: Secondary | ICD-10-CM | POA: Diagnosis not present

## 2015-07-10 DIAGNOSIS — N2581 Secondary hyperparathyroidism of renal origin: Secondary | ICD-10-CM | POA: Diagnosis not present

## 2015-07-11 DIAGNOSIS — M79601 Pain in right arm: Secondary | ICD-10-CM | POA: Diagnosis not present

## 2015-07-11 DIAGNOSIS — I10 Essential (primary) hypertension: Secondary | ICD-10-CM | POA: Diagnosis not present

## 2015-07-11 DIAGNOSIS — Z9181 History of falling: Secondary | ICD-10-CM | POA: Diagnosis not present

## 2015-07-11 DIAGNOSIS — S40021A Contusion of right upper arm, initial encounter: Secondary | ICD-10-CM | POA: Diagnosis not present

## 2015-07-12 DIAGNOSIS — N186 End stage renal disease: Secondary | ICD-10-CM | POA: Diagnosis not present

## 2015-07-12 DIAGNOSIS — N2581 Secondary hyperparathyroidism of renal origin: Secondary | ICD-10-CM | POA: Diagnosis not present

## 2015-07-12 DIAGNOSIS — D509 Iron deficiency anemia, unspecified: Secondary | ICD-10-CM | POA: Diagnosis not present

## 2015-07-12 DIAGNOSIS — D631 Anemia in chronic kidney disease: Secondary | ICD-10-CM | POA: Diagnosis not present

## 2015-07-14 DIAGNOSIS — N2581 Secondary hyperparathyroidism of renal origin: Secondary | ICD-10-CM | POA: Diagnosis not present

## 2015-07-14 DIAGNOSIS — D509 Iron deficiency anemia, unspecified: Secondary | ICD-10-CM | POA: Diagnosis not present

## 2015-07-14 DIAGNOSIS — N186 End stage renal disease: Secondary | ICD-10-CM | POA: Diagnosis not present

## 2015-07-14 DIAGNOSIS — D631 Anemia in chronic kidney disease: Secondary | ICD-10-CM | POA: Diagnosis not present

## 2015-07-15 DIAGNOSIS — M5136 Other intervertebral disc degeneration, lumbar region: Secondary | ICD-10-CM | POA: Diagnosis not present

## 2015-07-17 DIAGNOSIS — D631 Anemia in chronic kidney disease: Secondary | ICD-10-CM | POA: Diagnosis not present

## 2015-07-17 DIAGNOSIS — N186 End stage renal disease: Secondary | ICD-10-CM | POA: Diagnosis not present

## 2015-07-17 DIAGNOSIS — N2581 Secondary hyperparathyroidism of renal origin: Secondary | ICD-10-CM | POA: Diagnosis not present

## 2015-07-17 DIAGNOSIS — D509 Iron deficiency anemia, unspecified: Secondary | ICD-10-CM | POA: Diagnosis not present

## 2015-07-18 DIAGNOSIS — Z01810 Encounter for preprocedural cardiovascular examination: Secondary | ICD-10-CM | POA: Diagnosis not present

## 2015-07-18 DIAGNOSIS — Z01818 Encounter for other preprocedural examination: Secondary | ICD-10-CM | POA: Diagnosis not present

## 2015-07-18 DIAGNOSIS — Z01812 Encounter for preprocedural laboratory examination: Secondary | ICD-10-CM | POA: Diagnosis not present

## 2015-07-19 DIAGNOSIS — N2581 Secondary hyperparathyroidism of renal origin: Secondary | ICD-10-CM | POA: Diagnosis not present

## 2015-07-19 DIAGNOSIS — D631 Anemia in chronic kidney disease: Secondary | ICD-10-CM | POA: Diagnosis not present

## 2015-07-19 DIAGNOSIS — N186 End stage renal disease: Secondary | ICD-10-CM | POA: Diagnosis not present

## 2015-07-19 DIAGNOSIS — D509 Iron deficiency anemia, unspecified: Secondary | ICD-10-CM | POA: Diagnosis not present

## 2015-07-24 DIAGNOSIS — D509 Iron deficiency anemia, unspecified: Secondary | ICD-10-CM | POA: Diagnosis not present

## 2015-07-24 DIAGNOSIS — N2581 Secondary hyperparathyroidism of renal origin: Secondary | ICD-10-CM | POA: Diagnosis not present

## 2015-07-24 DIAGNOSIS — N186 End stage renal disease: Secondary | ICD-10-CM | POA: Diagnosis not present

## 2015-07-24 DIAGNOSIS — D631 Anemia in chronic kidney disease: Secondary | ICD-10-CM | POA: Diagnosis not present

## 2015-07-26 DIAGNOSIS — D509 Iron deficiency anemia, unspecified: Secondary | ICD-10-CM | POA: Diagnosis not present

## 2015-07-26 DIAGNOSIS — N186 End stage renal disease: Secondary | ICD-10-CM | POA: Diagnosis not present

## 2015-07-26 DIAGNOSIS — N2581 Secondary hyperparathyroidism of renal origin: Secondary | ICD-10-CM | POA: Diagnosis not present

## 2015-07-26 DIAGNOSIS — D631 Anemia in chronic kidney disease: Secondary | ICD-10-CM | POA: Diagnosis not present

## 2015-07-28 DIAGNOSIS — J449 Chronic obstructive pulmonary disease, unspecified: Secondary | ICD-10-CM | POA: Diagnosis not present

## 2015-07-28 DIAGNOSIS — J209 Acute bronchitis, unspecified: Secondary | ICD-10-CM | POA: Diagnosis not present

## 2015-07-30 DIAGNOSIS — D509 Iron deficiency anemia, unspecified: Secondary | ICD-10-CM | POA: Diagnosis not present

## 2015-07-30 DIAGNOSIS — N186 End stage renal disease: Secondary | ICD-10-CM | POA: Diagnosis not present

## 2015-07-30 DIAGNOSIS — D631 Anemia in chronic kidney disease: Secondary | ICD-10-CM | POA: Diagnosis not present

## 2015-07-30 DIAGNOSIS — N2581 Secondary hyperparathyroidism of renal origin: Secondary | ICD-10-CM | POA: Diagnosis not present

## 2015-08-01 DIAGNOSIS — N033 Chronic nephritic syndrome with diffuse mesangial proliferative glomerulonephritis: Secondary | ICD-10-CM | POA: Diagnosis not present

## 2015-08-01 DIAGNOSIS — Z992 Dependence on renal dialysis: Secondary | ICD-10-CM | POA: Diagnosis not present

## 2015-08-01 DIAGNOSIS — N186 End stage renal disease: Secondary | ICD-10-CM | POA: Diagnosis not present

## 2015-08-04 DIAGNOSIS — D631 Anemia in chronic kidney disease: Secondary | ICD-10-CM | POA: Diagnosis not present

## 2015-08-04 DIAGNOSIS — N186 End stage renal disease: Secondary | ICD-10-CM | POA: Diagnosis not present

## 2015-08-04 DIAGNOSIS — Z23 Encounter for immunization: Secondary | ICD-10-CM | POA: Diagnosis not present

## 2015-08-04 DIAGNOSIS — N2581 Secondary hyperparathyroidism of renal origin: Secondary | ICD-10-CM | POA: Diagnosis not present

## 2015-08-05 DIAGNOSIS — D631 Anemia in chronic kidney disease: Secondary | ICD-10-CM | POA: Diagnosis not present

## 2015-08-05 DIAGNOSIS — N2581 Secondary hyperparathyroidism of renal origin: Secondary | ICD-10-CM | POA: Diagnosis not present

## 2015-08-05 DIAGNOSIS — Z23 Encounter for immunization: Secondary | ICD-10-CM | POA: Diagnosis not present

## 2015-08-05 DIAGNOSIS — N186 End stage renal disease: Secondary | ICD-10-CM | POA: Diagnosis not present

## 2015-08-07 DIAGNOSIS — N186 End stage renal disease: Secondary | ICD-10-CM | POA: Diagnosis not present

## 2015-08-07 DIAGNOSIS — D631 Anemia in chronic kidney disease: Secondary | ICD-10-CM | POA: Diagnosis not present

## 2015-08-07 DIAGNOSIS — N2581 Secondary hyperparathyroidism of renal origin: Secondary | ICD-10-CM | POA: Diagnosis not present

## 2015-08-07 DIAGNOSIS — Z23 Encounter for immunization: Secondary | ICD-10-CM | POA: Diagnosis not present

## 2015-08-09 DIAGNOSIS — N2581 Secondary hyperparathyroidism of renal origin: Secondary | ICD-10-CM | POA: Diagnosis not present

## 2015-08-09 DIAGNOSIS — N186 End stage renal disease: Secondary | ICD-10-CM | POA: Diagnosis not present

## 2015-08-09 DIAGNOSIS — Z23 Encounter for immunization: Secondary | ICD-10-CM | POA: Diagnosis not present

## 2015-08-09 DIAGNOSIS — D631 Anemia in chronic kidney disease: Secondary | ICD-10-CM | POA: Diagnosis not present

## 2015-08-11 DIAGNOSIS — M1288 Other specific arthropathies, not elsewhere classified, other specified site: Secondary | ICD-10-CM | POA: Diagnosis not present

## 2015-08-11 DIAGNOSIS — M791 Myalgia: Secondary | ICD-10-CM | POA: Diagnosis not present

## 2015-08-11 DIAGNOSIS — M5417 Radiculopathy, lumbosacral region: Secondary | ICD-10-CM | POA: Diagnosis not present

## 2015-08-11 DIAGNOSIS — G894 Chronic pain syndrome: Secondary | ICD-10-CM | POA: Diagnosis not present

## 2015-08-11 DIAGNOSIS — M961 Postlaminectomy syndrome, not elsewhere classified: Secondary | ICD-10-CM | POA: Diagnosis not present

## 2015-08-11 DIAGNOSIS — Z5181 Encounter for therapeutic drug level monitoring: Secondary | ICD-10-CM | POA: Diagnosis not present

## 2015-08-11 DIAGNOSIS — Z79899 Other long term (current) drug therapy: Secondary | ICD-10-CM | POA: Diagnosis not present

## 2015-08-11 DIAGNOSIS — M542 Cervicalgia: Secondary | ICD-10-CM | POA: Diagnosis not present

## 2015-08-12 DIAGNOSIS — N186 End stage renal disease: Secondary | ICD-10-CM | POA: Diagnosis not present

## 2015-08-12 DIAGNOSIS — Z23 Encounter for immunization: Secondary | ICD-10-CM | POA: Diagnosis not present

## 2015-08-12 DIAGNOSIS — D631 Anemia in chronic kidney disease: Secondary | ICD-10-CM | POA: Diagnosis not present

## 2015-08-12 DIAGNOSIS — N2581 Secondary hyperparathyroidism of renal origin: Secondary | ICD-10-CM | POA: Diagnosis not present

## 2015-08-14 DIAGNOSIS — Z23 Encounter for immunization: Secondary | ICD-10-CM | POA: Diagnosis not present

## 2015-08-14 DIAGNOSIS — N2581 Secondary hyperparathyroidism of renal origin: Secondary | ICD-10-CM | POA: Diagnosis not present

## 2015-08-14 DIAGNOSIS — D631 Anemia in chronic kidney disease: Secondary | ICD-10-CM | POA: Diagnosis not present

## 2015-08-14 DIAGNOSIS — N186 End stage renal disease: Secondary | ICD-10-CM | POA: Diagnosis not present

## 2015-08-19 DIAGNOSIS — Z23 Encounter for immunization: Secondary | ICD-10-CM | POA: Diagnosis not present

## 2015-08-19 DIAGNOSIS — D631 Anemia in chronic kidney disease: Secondary | ICD-10-CM | POA: Diagnosis not present

## 2015-08-19 DIAGNOSIS — N186 End stage renal disease: Secondary | ICD-10-CM | POA: Diagnosis not present

## 2015-08-19 DIAGNOSIS — N2581 Secondary hyperparathyroidism of renal origin: Secondary | ICD-10-CM | POA: Diagnosis not present

## 2015-08-21 DIAGNOSIS — N186 End stage renal disease: Secondary | ICD-10-CM | POA: Diagnosis not present

## 2015-08-21 DIAGNOSIS — N2581 Secondary hyperparathyroidism of renal origin: Secondary | ICD-10-CM | POA: Diagnosis not present

## 2015-08-21 DIAGNOSIS — D631 Anemia in chronic kidney disease: Secondary | ICD-10-CM | POA: Diagnosis not present

## 2015-08-21 DIAGNOSIS — Z23 Encounter for immunization: Secondary | ICD-10-CM | POA: Diagnosis not present

## 2015-08-25 DIAGNOSIS — L723 Sebaceous cyst: Secondary | ICD-10-CM | POA: Diagnosis not present

## 2015-08-25 DIAGNOSIS — J209 Acute bronchitis, unspecified: Secondary | ICD-10-CM | POA: Diagnosis not present

## 2015-08-25 DIAGNOSIS — I10 Essential (primary) hypertension: Secondary | ICD-10-CM | POA: Diagnosis not present

## 2015-08-25 DIAGNOSIS — F329 Major depressive disorder, single episode, unspecified: Secondary | ICD-10-CM | POA: Diagnosis not present

## 2015-08-26 DIAGNOSIS — Z23 Encounter for immunization: Secondary | ICD-10-CM | POA: Diagnosis not present

## 2015-08-26 DIAGNOSIS — N2581 Secondary hyperparathyroidism of renal origin: Secondary | ICD-10-CM | POA: Diagnosis not present

## 2015-08-26 DIAGNOSIS — D631 Anemia in chronic kidney disease: Secondary | ICD-10-CM | POA: Diagnosis not present

## 2015-08-26 DIAGNOSIS — N186 End stage renal disease: Secondary | ICD-10-CM | POA: Diagnosis not present

## 2015-08-28 DIAGNOSIS — D631 Anemia in chronic kidney disease: Secondary | ICD-10-CM | POA: Diagnosis not present

## 2015-08-28 DIAGNOSIS — Z23 Encounter for immunization: Secondary | ICD-10-CM | POA: Diagnosis not present

## 2015-08-28 DIAGNOSIS — E119 Type 2 diabetes mellitus without complications: Secondary | ICD-10-CM | POA: Diagnosis not present

## 2015-08-28 DIAGNOSIS — N186 End stage renal disease: Secondary | ICD-10-CM | POA: Diagnosis not present

## 2015-08-28 DIAGNOSIS — N2581 Secondary hyperparathyroidism of renal origin: Secondary | ICD-10-CM | POA: Diagnosis not present

## 2015-08-30 DIAGNOSIS — N186 End stage renal disease: Secondary | ICD-10-CM | POA: Diagnosis not present

## 2015-08-30 DIAGNOSIS — N2581 Secondary hyperparathyroidism of renal origin: Secondary | ICD-10-CM | POA: Diagnosis not present

## 2015-08-30 DIAGNOSIS — Z23 Encounter for immunization: Secondary | ICD-10-CM | POA: Diagnosis not present

## 2015-08-30 DIAGNOSIS — D631 Anemia in chronic kidney disease: Secondary | ICD-10-CM | POA: Diagnosis not present

## 2015-09-01 DIAGNOSIS — N033 Chronic nephritic syndrome with diffuse mesangial proliferative glomerulonephritis: Secondary | ICD-10-CM | POA: Diagnosis not present

## 2015-09-01 DIAGNOSIS — N186 End stage renal disease: Secondary | ICD-10-CM | POA: Diagnosis not present

## 2015-09-01 DIAGNOSIS — Z992 Dependence on renal dialysis: Secondary | ICD-10-CM | POA: Diagnosis not present

## 2015-09-04 DIAGNOSIS — Z23 Encounter for immunization: Secondary | ICD-10-CM | POA: Diagnosis not present

## 2015-09-04 DIAGNOSIS — D631 Anemia in chronic kidney disease: Secondary | ICD-10-CM | POA: Diagnosis not present

## 2015-09-04 DIAGNOSIS — N186 End stage renal disease: Secondary | ICD-10-CM | POA: Diagnosis not present

## 2015-09-09 DIAGNOSIS — Z886 Allergy status to analgesic agent status: Secondary | ICD-10-CM | POA: Diagnosis not present

## 2015-09-09 DIAGNOSIS — N185 Chronic kidney disease, stage 5: Secondary | ICD-10-CM | POA: Diagnosis not present

## 2015-09-09 DIAGNOSIS — M1288 Other specific arthropathies, not elsewhere classified, other specified site: Secondary | ICD-10-CM | POA: Diagnosis not present

## 2015-09-09 DIAGNOSIS — Z881 Allergy status to other antibiotic agents status: Secondary | ICD-10-CM | POA: Diagnosis not present

## 2015-09-09 DIAGNOSIS — Z6832 Body mass index (BMI) 32.0-32.9, adult: Secondary | ICD-10-CM | POA: Diagnosis not present

## 2015-09-09 DIAGNOSIS — M5136 Other intervertebral disc degeneration, lumbar region: Secondary | ICD-10-CM | POA: Diagnosis not present

## 2015-09-09 DIAGNOSIS — M5417 Radiculopathy, lumbosacral region: Secondary | ICD-10-CM | POA: Diagnosis not present

## 2015-09-09 DIAGNOSIS — Z992 Dependence on renal dialysis: Secondary | ICD-10-CM | POA: Diagnosis not present

## 2015-09-09 DIAGNOSIS — G4733 Obstructive sleep apnea (adult) (pediatric): Secondary | ICD-10-CM | POA: Diagnosis not present

## 2015-09-09 DIAGNOSIS — F17210 Nicotine dependence, cigarettes, uncomplicated: Secondary | ICD-10-CM | POA: Diagnosis not present

## 2015-09-09 DIAGNOSIS — Z885 Allergy status to narcotic agent status: Secondary | ICD-10-CM | POA: Diagnosis not present

## 2015-09-09 DIAGNOSIS — I120 Hypertensive chronic kidney disease with stage 5 chronic kidney disease or end stage renal disease: Secondary | ICD-10-CM | POA: Diagnosis not present

## 2015-09-09 DIAGNOSIS — G894 Chronic pain syndrome: Secondary | ICD-10-CM | POA: Diagnosis not present

## 2015-09-11 DIAGNOSIS — N186 End stage renal disease: Secondary | ICD-10-CM | POA: Diagnosis not present

## 2015-09-11 DIAGNOSIS — Z23 Encounter for immunization: Secondary | ICD-10-CM | POA: Diagnosis not present

## 2015-09-11 DIAGNOSIS — D631 Anemia in chronic kidney disease: Secondary | ICD-10-CM | POA: Diagnosis not present

## 2015-09-13 DIAGNOSIS — D631 Anemia in chronic kidney disease: Secondary | ICD-10-CM | POA: Diagnosis not present

## 2015-09-13 DIAGNOSIS — N186 End stage renal disease: Secondary | ICD-10-CM | POA: Diagnosis not present

## 2015-09-13 DIAGNOSIS — Z23 Encounter for immunization: Secondary | ICD-10-CM | POA: Diagnosis not present

## 2015-09-17 DIAGNOSIS — N186 End stage renal disease: Secondary | ICD-10-CM | POA: Diagnosis not present

## 2015-09-17 DIAGNOSIS — D631 Anemia in chronic kidney disease: Secondary | ICD-10-CM | POA: Diagnosis not present

## 2015-09-17 DIAGNOSIS — Z23 Encounter for immunization: Secondary | ICD-10-CM | POA: Diagnosis not present

## 2015-09-23 DIAGNOSIS — Z23 Encounter for immunization: Secondary | ICD-10-CM | POA: Diagnosis not present

## 2015-09-23 DIAGNOSIS — N186 End stage renal disease: Secondary | ICD-10-CM | POA: Diagnosis not present

## 2015-09-23 DIAGNOSIS — D631 Anemia in chronic kidney disease: Secondary | ICD-10-CM | POA: Diagnosis not present

## 2015-09-27 DIAGNOSIS — D631 Anemia in chronic kidney disease: Secondary | ICD-10-CM | POA: Diagnosis not present

## 2015-09-27 DIAGNOSIS — Z23 Encounter for immunization: Secondary | ICD-10-CM | POA: Diagnosis not present

## 2015-09-27 DIAGNOSIS — N186 End stage renal disease: Secondary | ICD-10-CM | POA: Diagnosis not present

## 2015-09-30 DIAGNOSIS — N186 End stage renal disease: Secondary | ICD-10-CM | POA: Diagnosis not present

## 2015-09-30 DIAGNOSIS — Z23 Encounter for immunization: Secondary | ICD-10-CM | POA: Diagnosis not present

## 2015-09-30 DIAGNOSIS — D631 Anemia in chronic kidney disease: Secondary | ICD-10-CM | POA: Diagnosis not present

## 2015-10-01 DIAGNOSIS — N186 End stage renal disease: Secondary | ICD-10-CM | POA: Diagnosis not present

## 2015-10-01 DIAGNOSIS — Z992 Dependence on renal dialysis: Secondary | ICD-10-CM | POA: Diagnosis not present

## 2015-10-01 DIAGNOSIS — N033 Chronic nephritic syndrome with diffuse mesangial proliferative glomerulonephritis: Secondary | ICD-10-CM | POA: Diagnosis not present

## 2015-10-07 DIAGNOSIS — D509 Iron deficiency anemia, unspecified: Secondary | ICD-10-CM | POA: Diagnosis not present

## 2015-10-07 DIAGNOSIS — N186 End stage renal disease: Secondary | ICD-10-CM | POA: Diagnosis not present

## 2015-10-07 DIAGNOSIS — D631 Anemia in chronic kidney disease: Secondary | ICD-10-CM | POA: Diagnosis not present

## 2015-10-11 DIAGNOSIS — D631 Anemia in chronic kidney disease: Secondary | ICD-10-CM | POA: Diagnosis not present

## 2015-10-11 DIAGNOSIS — N186 End stage renal disease: Secondary | ICD-10-CM | POA: Diagnosis not present

## 2015-10-11 DIAGNOSIS — D509 Iron deficiency anemia, unspecified: Secondary | ICD-10-CM | POA: Diagnosis not present

## 2015-10-13 DIAGNOSIS — G894 Chronic pain syndrome: Secondary | ICD-10-CM | POA: Diagnosis not present

## 2015-10-13 DIAGNOSIS — M5417 Radiculopathy, lumbosacral region: Secondary | ICD-10-CM | POA: Diagnosis not present

## 2015-10-13 DIAGNOSIS — M25559 Pain in unspecified hip: Secondary | ICD-10-CM | POA: Diagnosis not present

## 2015-10-13 DIAGNOSIS — M961 Postlaminectomy syndrome, not elsewhere classified: Secondary | ICD-10-CM | POA: Diagnosis not present

## 2015-10-15 DIAGNOSIS — D509 Iron deficiency anemia, unspecified: Secondary | ICD-10-CM | POA: Diagnosis not present

## 2015-10-15 DIAGNOSIS — N186 End stage renal disease: Secondary | ICD-10-CM | POA: Diagnosis not present

## 2015-10-15 DIAGNOSIS — D631 Anemia in chronic kidney disease: Secondary | ICD-10-CM | POA: Diagnosis not present

## 2015-10-16 DIAGNOSIS — D631 Anemia in chronic kidney disease: Secondary | ICD-10-CM | POA: Diagnosis not present

## 2015-10-16 DIAGNOSIS — N186 End stage renal disease: Secondary | ICD-10-CM | POA: Diagnosis not present

## 2015-10-16 DIAGNOSIS — D509 Iron deficiency anemia, unspecified: Secondary | ICD-10-CM | POA: Diagnosis not present

## 2015-10-21 DIAGNOSIS — D631 Anemia in chronic kidney disease: Secondary | ICD-10-CM | POA: Diagnosis not present

## 2015-10-21 DIAGNOSIS — D509 Iron deficiency anemia, unspecified: Secondary | ICD-10-CM | POA: Diagnosis not present

## 2015-10-21 DIAGNOSIS — N186 End stage renal disease: Secondary | ICD-10-CM | POA: Diagnosis not present

## 2015-10-23 DIAGNOSIS — D631 Anemia in chronic kidney disease: Secondary | ICD-10-CM | POA: Diagnosis not present

## 2015-10-23 DIAGNOSIS — D509 Iron deficiency anemia, unspecified: Secondary | ICD-10-CM | POA: Diagnosis not present

## 2015-10-23 DIAGNOSIS — N186 End stage renal disease: Secondary | ICD-10-CM | POA: Diagnosis not present

## 2015-10-29 DIAGNOSIS — D631 Anemia in chronic kidney disease: Secondary | ICD-10-CM | POA: Diagnosis not present

## 2015-10-29 DIAGNOSIS — D509 Iron deficiency anemia, unspecified: Secondary | ICD-10-CM | POA: Diagnosis not present

## 2015-10-29 DIAGNOSIS — N186 End stage renal disease: Secondary | ICD-10-CM | POA: Diagnosis not present

## 2015-10-30 DIAGNOSIS — J069 Acute upper respiratory infection, unspecified: Secondary | ICD-10-CM | POA: Diagnosis not present

## 2015-10-30 DIAGNOSIS — D509 Iron deficiency anemia, unspecified: Secondary | ICD-10-CM | POA: Diagnosis not present

## 2015-10-30 DIAGNOSIS — J449 Chronic obstructive pulmonary disease, unspecified: Secondary | ICD-10-CM | POA: Diagnosis not present

## 2015-10-30 DIAGNOSIS — N186 End stage renal disease: Secondary | ICD-10-CM | POA: Diagnosis not present

## 2015-10-30 DIAGNOSIS — D631 Anemia in chronic kidney disease: Secondary | ICD-10-CM | POA: Diagnosis not present

## 2015-11-01 DIAGNOSIS — N033 Chronic nephritic syndrome with diffuse mesangial proliferative glomerulonephritis: Secondary | ICD-10-CM | POA: Diagnosis not present

## 2015-11-01 DIAGNOSIS — D509 Iron deficiency anemia, unspecified: Secondary | ICD-10-CM | POA: Diagnosis not present

## 2015-11-01 DIAGNOSIS — Z992 Dependence on renal dialysis: Secondary | ICD-10-CM | POA: Diagnosis not present

## 2015-11-01 DIAGNOSIS — N186 End stage renal disease: Secondary | ICD-10-CM | POA: Diagnosis not present

## 2015-11-01 DIAGNOSIS — D631 Anemia in chronic kidney disease: Secondary | ICD-10-CM | POA: Diagnosis not present

## 2015-11-05 DIAGNOSIS — Z992 Dependence on renal dialysis: Secondary | ICD-10-CM | POA: Diagnosis not present

## 2015-11-05 DIAGNOSIS — N938 Other specified abnormal uterine and vaginal bleeding: Secondary | ICD-10-CM | POA: Diagnosis not present

## 2015-11-07 DIAGNOSIS — D509 Iron deficiency anemia, unspecified: Secondary | ICD-10-CM | POA: Diagnosis not present

## 2015-11-07 DIAGNOSIS — N186 End stage renal disease: Secondary | ICD-10-CM | POA: Diagnosis not present

## 2015-11-07 DIAGNOSIS — D631 Anemia in chronic kidney disease: Secondary | ICD-10-CM | POA: Diagnosis not present

## 2015-11-11 DIAGNOSIS — D509 Iron deficiency anemia, unspecified: Secondary | ICD-10-CM | POA: Diagnosis not present

## 2015-11-11 DIAGNOSIS — N186 End stage renal disease: Secondary | ICD-10-CM | POA: Diagnosis not present

## 2015-11-11 DIAGNOSIS — D631 Anemia in chronic kidney disease: Secondary | ICD-10-CM | POA: Diagnosis not present

## 2015-11-13 DIAGNOSIS — D631 Anemia in chronic kidney disease: Secondary | ICD-10-CM | POA: Diagnosis not present

## 2015-11-13 DIAGNOSIS — D509 Iron deficiency anemia, unspecified: Secondary | ICD-10-CM | POA: Diagnosis not present

## 2015-11-13 DIAGNOSIS — N186 End stage renal disease: Secondary | ICD-10-CM | POA: Diagnosis not present

## 2015-11-22 DIAGNOSIS — D631 Anemia in chronic kidney disease: Secondary | ICD-10-CM | POA: Diagnosis not present

## 2015-11-22 DIAGNOSIS — N186 End stage renal disease: Secondary | ICD-10-CM | POA: Diagnosis not present

## 2015-11-22 DIAGNOSIS — D509 Iron deficiency anemia, unspecified: Secondary | ICD-10-CM | POA: Diagnosis not present

## 2015-11-25 DIAGNOSIS — D509 Iron deficiency anemia, unspecified: Secondary | ICD-10-CM | POA: Diagnosis not present

## 2015-11-25 DIAGNOSIS — N186 End stage renal disease: Secondary | ICD-10-CM | POA: Diagnosis not present

## 2015-11-25 DIAGNOSIS — D631 Anemia in chronic kidney disease: Secondary | ICD-10-CM | POA: Diagnosis not present

## 2015-11-26 DIAGNOSIS — N938 Other specified abnormal uterine and vaginal bleeding: Secondary | ICD-10-CM | POA: Diagnosis not present

## 2015-11-27 DIAGNOSIS — E119 Type 2 diabetes mellitus without complications: Secondary | ICD-10-CM | POA: Diagnosis not present

## 2015-11-27 DIAGNOSIS — D509 Iron deficiency anemia, unspecified: Secondary | ICD-10-CM | POA: Diagnosis not present

## 2015-11-27 DIAGNOSIS — N186 End stage renal disease: Secondary | ICD-10-CM | POA: Diagnosis not present

## 2015-11-27 DIAGNOSIS — D631 Anemia in chronic kidney disease: Secondary | ICD-10-CM | POA: Diagnosis not present

## 2015-12-02 DIAGNOSIS — Z992 Dependence on renal dialysis: Secondary | ICD-10-CM | POA: Diagnosis not present

## 2015-12-02 DIAGNOSIS — N186 End stage renal disease: Secondary | ICD-10-CM | POA: Diagnosis not present

## 2015-12-02 DIAGNOSIS — N033 Chronic nephritic syndrome with diffuse mesangial proliferative glomerulonephritis: Secondary | ICD-10-CM | POA: Diagnosis not present

## 2015-12-04 DIAGNOSIS — D631 Anemia in chronic kidney disease: Secondary | ICD-10-CM | POA: Diagnosis not present

## 2015-12-04 DIAGNOSIS — N186 End stage renal disease: Secondary | ICD-10-CM | POA: Diagnosis not present

## 2015-12-06 DIAGNOSIS — D631 Anemia in chronic kidney disease: Secondary | ICD-10-CM | POA: Diagnosis not present

## 2015-12-06 DIAGNOSIS — N186 End stage renal disease: Secondary | ICD-10-CM | POA: Diagnosis not present

## 2015-12-08 DIAGNOSIS — M25559 Pain in unspecified hip: Secondary | ICD-10-CM | POA: Diagnosis not present

## 2015-12-08 DIAGNOSIS — G43819 Other migraine, intractable, without status migrainosus: Secondary | ICD-10-CM | POA: Diagnosis not present

## 2015-12-08 DIAGNOSIS — M961 Postlaminectomy syndrome, not elsewhere classified: Secondary | ICD-10-CM | POA: Diagnosis not present

## 2015-12-08 DIAGNOSIS — M5417 Radiculopathy, lumbosacral region: Secondary | ICD-10-CM | POA: Diagnosis not present

## 2015-12-10 DIAGNOSIS — D631 Anemia in chronic kidney disease: Secondary | ICD-10-CM | POA: Diagnosis not present

## 2015-12-10 DIAGNOSIS — N186 End stage renal disease: Secondary | ICD-10-CM | POA: Diagnosis not present

## 2015-12-11 DIAGNOSIS — D631 Anemia in chronic kidney disease: Secondary | ICD-10-CM | POA: Diagnosis not present

## 2015-12-11 DIAGNOSIS — N186 End stage renal disease: Secondary | ICD-10-CM | POA: Diagnosis not present

## 2015-12-13 DIAGNOSIS — D631 Anemia in chronic kidney disease: Secondary | ICD-10-CM | POA: Diagnosis not present

## 2015-12-13 DIAGNOSIS — N186 End stage renal disease: Secondary | ICD-10-CM | POA: Diagnosis not present

## 2015-12-17 DIAGNOSIS — D631 Anemia in chronic kidney disease: Secondary | ICD-10-CM | POA: Diagnosis not present

## 2015-12-17 DIAGNOSIS — N186 End stage renal disease: Secondary | ICD-10-CM | POA: Diagnosis not present

## 2015-12-18 DIAGNOSIS — D631 Anemia in chronic kidney disease: Secondary | ICD-10-CM | POA: Diagnosis not present

## 2015-12-18 DIAGNOSIS — N186 End stage renal disease: Secondary | ICD-10-CM | POA: Diagnosis not present

## 2015-12-20 DIAGNOSIS — N186 End stage renal disease: Secondary | ICD-10-CM | POA: Diagnosis not present

## 2015-12-20 DIAGNOSIS — D631 Anemia in chronic kidney disease: Secondary | ICD-10-CM | POA: Diagnosis not present

## 2015-12-23 DIAGNOSIS — N970 Female infertility associated with anovulation: Secondary | ICD-10-CM | POA: Diagnosis not present

## 2015-12-23 DIAGNOSIS — N939 Abnormal uterine and vaginal bleeding, unspecified: Secondary | ICD-10-CM | POA: Diagnosis not present

## 2015-12-23 DIAGNOSIS — Z113 Encounter for screening for infections with a predominantly sexual mode of transmission: Secondary | ICD-10-CM | POA: Diagnosis not present

## 2015-12-23 DIAGNOSIS — Z881 Allergy status to other antibiotic agents status: Secondary | ICD-10-CM | POA: Diagnosis not present

## 2015-12-23 DIAGNOSIS — I120 Hypertensive chronic kidney disease with stage 5 chronic kidney disease or end stage renal disease: Secondary | ICD-10-CM | POA: Diagnosis not present

## 2015-12-23 DIAGNOSIS — Z888 Allergy status to other drugs, medicaments and biological substances status: Secondary | ICD-10-CM | POA: Diagnosis not present

## 2015-12-23 DIAGNOSIS — Z885 Allergy status to narcotic agent status: Secondary | ICD-10-CM | POA: Diagnosis not present

## 2015-12-23 DIAGNOSIS — F17200 Nicotine dependence, unspecified, uncomplicated: Secondary | ICD-10-CM | POA: Diagnosis not present

## 2015-12-23 DIAGNOSIS — N186 End stage renal disease: Secondary | ICD-10-CM | POA: Diagnosis not present

## 2015-12-23 DIAGNOSIS — D631 Anemia in chronic kidney disease: Secondary | ICD-10-CM | POA: Diagnosis not present

## 2015-12-25 DIAGNOSIS — N186 End stage renal disease: Secondary | ICD-10-CM | POA: Diagnosis not present

## 2015-12-25 DIAGNOSIS — D631 Anemia in chronic kidney disease: Secondary | ICD-10-CM | POA: Diagnosis not present

## 2015-12-30 DIAGNOSIS — Z992 Dependence on renal dialysis: Secondary | ICD-10-CM | POA: Diagnosis not present

## 2015-12-30 DIAGNOSIS — N186 End stage renal disease: Secondary | ICD-10-CM | POA: Diagnosis not present

## 2015-12-30 DIAGNOSIS — N033 Chronic nephritic syndrome with diffuse mesangial proliferative glomerulonephritis: Secondary | ICD-10-CM | POA: Diagnosis not present

## 2015-12-31 DIAGNOSIS — D631 Anemia in chronic kidney disease: Secondary | ICD-10-CM | POA: Diagnosis not present

## 2015-12-31 DIAGNOSIS — N186 End stage renal disease: Secondary | ICD-10-CM | POA: Diagnosis not present

## 2015-12-31 DIAGNOSIS — D509 Iron deficiency anemia, unspecified: Secondary | ICD-10-CM | POA: Diagnosis not present

## 2016-01-01 DIAGNOSIS — D631 Anemia in chronic kidney disease: Secondary | ICD-10-CM | POA: Diagnosis not present

## 2016-01-01 DIAGNOSIS — J449 Chronic obstructive pulmonary disease, unspecified: Secondary | ICD-10-CM | POA: Diagnosis not present

## 2016-01-01 DIAGNOSIS — D509 Iron deficiency anemia, unspecified: Secondary | ICD-10-CM | POA: Diagnosis not present

## 2016-01-01 DIAGNOSIS — N186 End stage renal disease: Secondary | ICD-10-CM | POA: Diagnosis not present

## 2016-01-01 DIAGNOSIS — J069 Acute upper respiratory infection, unspecified: Secondary | ICD-10-CM | POA: Diagnosis not present

## 2016-01-02 DIAGNOSIS — N921 Excessive and frequent menstruation with irregular cycle: Secondary | ICD-10-CM | POA: Diagnosis not present

## 2016-01-03 DIAGNOSIS — N186 End stage renal disease: Secondary | ICD-10-CM | POA: Diagnosis not present

## 2016-01-03 DIAGNOSIS — D631 Anemia in chronic kidney disease: Secondary | ICD-10-CM | POA: Diagnosis not present

## 2016-01-03 DIAGNOSIS — D509 Iron deficiency anemia, unspecified: Secondary | ICD-10-CM | POA: Diagnosis not present

## 2016-01-06 DIAGNOSIS — D631 Anemia in chronic kidney disease: Secondary | ICD-10-CM | POA: Diagnosis not present

## 2016-01-06 DIAGNOSIS — D509 Iron deficiency anemia, unspecified: Secondary | ICD-10-CM | POA: Diagnosis not present

## 2016-01-06 DIAGNOSIS — N186 End stage renal disease: Secondary | ICD-10-CM | POA: Diagnosis not present

## 2016-01-08 DIAGNOSIS — N186 End stage renal disease: Secondary | ICD-10-CM | POA: Diagnosis not present

## 2016-01-08 DIAGNOSIS — D509 Iron deficiency anemia, unspecified: Secondary | ICD-10-CM | POA: Diagnosis not present

## 2016-01-08 DIAGNOSIS — D631 Anemia in chronic kidney disease: Secondary | ICD-10-CM | POA: Diagnosis not present

## 2016-01-10 DIAGNOSIS — D631 Anemia in chronic kidney disease: Secondary | ICD-10-CM | POA: Diagnosis not present

## 2016-01-10 DIAGNOSIS — N186 End stage renal disease: Secondary | ICD-10-CM | POA: Diagnosis not present

## 2016-01-10 DIAGNOSIS — D509 Iron deficiency anemia, unspecified: Secondary | ICD-10-CM | POA: Diagnosis not present

## 2016-01-12 DIAGNOSIS — H00019 Hordeolum externum unspecified eye, unspecified eyelid: Secondary | ICD-10-CM | POA: Diagnosis not present

## 2016-01-12 DIAGNOSIS — G43009 Migraine without aura, not intractable, without status migrainosus: Secondary | ICD-10-CM | POA: Diagnosis not present

## 2016-01-12 DIAGNOSIS — J449 Chronic obstructive pulmonary disease, unspecified: Secondary | ICD-10-CM | POA: Diagnosis not present

## 2016-01-13 DIAGNOSIS — D509 Iron deficiency anemia, unspecified: Secondary | ICD-10-CM | POA: Diagnosis not present

## 2016-01-13 DIAGNOSIS — D631 Anemia in chronic kidney disease: Secondary | ICD-10-CM | POA: Diagnosis not present

## 2016-01-13 DIAGNOSIS — N186 End stage renal disease: Secondary | ICD-10-CM | POA: Diagnosis not present

## 2016-01-14 DIAGNOSIS — Z79891 Long term (current) use of opiate analgesic: Secondary | ICD-10-CM | POA: Diagnosis not present

## 2016-01-14 DIAGNOSIS — I120 Hypertensive chronic kidney disease with stage 5 chronic kidney disease or end stage renal disease: Secondary | ICD-10-CM | POA: Diagnosis not present

## 2016-01-14 DIAGNOSIS — J45909 Unspecified asthma, uncomplicated: Secondary | ICD-10-CM | POA: Diagnosis not present

## 2016-01-14 DIAGNOSIS — Z885 Allergy status to narcotic agent status: Secondary | ICD-10-CM | POA: Diagnosis not present

## 2016-01-14 DIAGNOSIS — Z7951 Long term (current) use of inhaled steroids: Secondary | ICD-10-CM | POA: Diagnosis not present

## 2016-01-14 DIAGNOSIS — Z79899 Other long term (current) drug therapy: Secondary | ICD-10-CM | POA: Diagnosis not present

## 2016-01-14 DIAGNOSIS — Z9109 Other allergy status, other than to drugs and biological substances: Secondary | ICD-10-CM | POA: Diagnosis not present

## 2016-01-14 DIAGNOSIS — Z886 Allergy status to analgesic agent status: Secondary | ICD-10-CM | POA: Diagnosis not present

## 2016-01-14 DIAGNOSIS — N800 Endometriosis of uterus: Secondary | ICD-10-CM | POA: Diagnosis not present

## 2016-01-14 DIAGNOSIS — N939 Abnormal uterine and vaginal bleeding, unspecified: Secondary | ICD-10-CM | POA: Diagnosis not present

## 2016-01-14 DIAGNOSIS — Z881 Allergy status to other antibiotic agents status: Secondary | ICD-10-CM | POA: Diagnosis not present

## 2016-01-14 DIAGNOSIS — Z91018 Allergy to other foods: Secondary | ICD-10-CM | POA: Diagnosis not present

## 2016-01-14 DIAGNOSIS — F17210 Nicotine dependence, cigarettes, uncomplicated: Secondary | ICD-10-CM | POA: Diagnosis not present

## 2016-01-14 DIAGNOSIS — Z992 Dependence on renal dialysis: Secondary | ICD-10-CM | POA: Diagnosis not present

## 2016-01-14 DIAGNOSIS — F418 Other specified anxiety disorders: Secondary | ICD-10-CM | POA: Diagnosis not present

## 2016-01-14 DIAGNOSIS — G894 Chronic pain syndrome: Secondary | ICD-10-CM | POA: Diagnosis not present

## 2016-01-14 DIAGNOSIS — Z716 Tobacco abuse counseling: Secondary | ICD-10-CM | POA: Diagnosis not present

## 2016-01-14 DIAGNOSIS — Z8614 Personal history of Methicillin resistant Staphylococcus aureus infection: Secondary | ICD-10-CM | POA: Diagnosis not present

## 2016-01-14 DIAGNOSIS — N185 Chronic kidney disease, stage 5: Secondary | ICD-10-CM | POA: Diagnosis not present

## 2016-01-15 DIAGNOSIS — N189 Chronic kidney disease, unspecified: Secondary | ICD-10-CM | POA: Diagnosis not present

## 2016-01-15 DIAGNOSIS — Z992 Dependence on renal dialysis: Secondary | ICD-10-CM | POA: Diagnosis not present

## 2016-01-15 DIAGNOSIS — N185 Chronic kidney disease, stage 5: Secondary | ICD-10-CM | POA: Diagnosis not present

## 2016-01-15 DIAGNOSIS — I10 Essential (primary) hypertension: Secondary | ICD-10-CM | POA: Diagnosis not present

## 2016-01-15 DIAGNOSIS — F418 Other specified anxiety disorders: Secondary | ICD-10-CM | POA: Diagnosis not present

## 2016-01-15 DIAGNOSIS — D631 Anemia in chronic kidney disease: Secondary | ICD-10-CM | POA: Diagnosis not present

## 2016-01-15 DIAGNOSIS — N939 Abnormal uterine and vaginal bleeding, unspecified: Secondary | ICD-10-CM | POA: Diagnosis not present

## 2016-01-15 DIAGNOSIS — J45909 Unspecified asthma, uncomplicated: Secondary | ICD-10-CM | POA: Diagnosis not present

## 2016-01-15 DIAGNOSIS — I120 Hypertensive chronic kidney disease with stage 5 chronic kidney disease or end stage renal disease: Secondary | ICD-10-CM | POA: Diagnosis not present

## 2016-01-21 DIAGNOSIS — N186 End stage renal disease: Secondary | ICD-10-CM | POA: Diagnosis not present

## 2016-01-21 DIAGNOSIS — D509 Iron deficiency anemia, unspecified: Secondary | ICD-10-CM | POA: Diagnosis not present

## 2016-01-21 DIAGNOSIS — D631 Anemia in chronic kidney disease: Secondary | ICD-10-CM | POA: Diagnosis not present

## 2016-01-22 DIAGNOSIS — D509 Iron deficiency anemia, unspecified: Secondary | ICD-10-CM | POA: Diagnosis not present

## 2016-01-22 DIAGNOSIS — N186 End stage renal disease: Secondary | ICD-10-CM | POA: Diagnosis not present

## 2016-01-22 DIAGNOSIS — D631 Anemia in chronic kidney disease: Secondary | ICD-10-CM | POA: Diagnosis not present

## 2016-01-27 DIAGNOSIS — N186 End stage renal disease: Secondary | ICD-10-CM | POA: Diagnosis not present

## 2016-01-27 DIAGNOSIS — D631 Anemia in chronic kidney disease: Secondary | ICD-10-CM | POA: Diagnosis not present

## 2016-01-27 DIAGNOSIS — D509 Iron deficiency anemia, unspecified: Secondary | ICD-10-CM | POA: Diagnosis not present

## 2016-01-29 DIAGNOSIS — D509 Iron deficiency anemia, unspecified: Secondary | ICD-10-CM | POA: Diagnosis not present

## 2016-01-29 DIAGNOSIS — D631 Anemia in chronic kidney disease: Secondary | ICD-10-CM | POA: Diagnosis not present

## 2016-01-29 DIAGNOSIS — N186 End stage renal disease: Secondary | ICD-10-CM | POA: Diagnosis not present

## 2016-01-30 DIAGNOSIS — K5900 Constipation, unspecified: Secondary | ICD-10-CM | POA: Diagnosis not present

## 2016-01-30 DIAGNOSIS — N186 End stage renal disease: Secondary | ICD-10-CM | POA: Diagnosis not present

## 2016-01-30 DIAGNOSIS — N033 Chronic nephritic syndrome with diffuse mesangial proliferative glomerulonephritis: Secondary | ICD-10-CM | POA: Diagnosis not present

## 2016-01-30 DIAGNOSIS — G8929 Other chronic pain: Secondary | ICD-10-CM | POA: Diagnosis not present

## 2016-01-30 DIAGNOSIS — N800 Endometriosis of uterus: Secondary | ICD-10-CM | POA: Diagnosis not present

## 2016-01-30 DIAGNOSIS — I120 Hypertensive chronic kidney disease with stage 5 chronic kidney disease or end stage renal disease: Secondary | ICD-10-CM | POA: Diagnosis not present

## 2016-01-30 DIAGNOSIS — Z90710 Acquired absence of both cervix and uterus: Secondary | ICD-10-CM | POA: Diagnosis not present

## 2016-01-30 DIAGNOSIS — F17210 Nicotine dependence, cigarettes, uncomplicated: Secondary | ICD-10-CM | POA: Diagnosis not present

## 2016-01-30 DIAGNOSIS — Z992 Dependence on renal dialysis: Secondary | ICD-10-CM | POA: Diagnosis not present

## 2016-01-31 DIAGNOSIS — D631 Anemia in chronic kidney disease: Secondary | ICD-10-CM | POA: Diagnosis not present

## 2016-01-31 DIAGNOSIS — N186 End stage renal disease: Secondary | ICD-10-CM | POA: Diagnosis not present

## 2016-01-31 DIAGNOSIS — D509 Iron deficiency anemia, unspecified: Secondary | ICD-10-CM | POA: Diagnosis not present

## 2016-02-02 DIAGNOSIS — M961 Postlaminectomy syndrome, not elsewhere classified: Secondary | ICD-10-CM | POA: Diagnosis not present

## 2016-02-02 DIAGNOSIS — M25559 Pain in unspecified hip: Secondary | ICD-10-CM | POA: Diagnosis not present

## 2016-02-02 DIAGNOSIS — M1288 Other specific arthropathies, not elsewhere classified, other specified site: Secondary | ICD-10-CM | POA: Diagnosis not present

## 2016-02-02 DIAGNOSIS — M5417 Radiculopathy, lumbosacral region: Secondary | ICD-10-CM | POA: Diagnosis not present

## 2016-02-04 DIAGNOSIS — D631 Anemia in chronic kidney disease: Secondary | ICD-10-CM | POA: Diagnosis not present

## 2016-02-04 DIAGNOSIS — D509 Iron deficiency anemia, unspecified: Secondary | ICD-10-CM | POA: Diagnosis not present

## 2016-02-04 DIAGNOSIS — N186 End stage renal disease: Secondary | ICD-10-CM | POA: Diagnosis not present

## 2016-02-05 DIAGNOSIS — D509 Iron deficiency anemia, unspecified: Secondary | ICD-10-CM | POA: Diagnosis not present

## 2016-02-05 DIAGNOSIS — D631 Anemia in chronic kidney disease: Secondary | ICD-10-CM | POA: Diagnosis not present

## 2016-02-05 DIAGNOSIS — N186 End stage renal disease: Secondary | ICD-10-CM | POA: Diagnosis not present

## 2016-02-10 DIAGNOSIS — D509 Iron deficiency anemia, unspecified: Secondary | ICD-10-CM | POA: Diagnosis not present

## 2016-02-10 DIAGNOSIS — N186 End stage renal disease: Secondary | ICD-10-CM | POA: Diagnosis not present

## 2016-02-10 DIAGNOSIS — D631 Anemia in chronic kidney disease: Secondary | ICD-10-CM | POA: Diagnosis not present

## 2016-02-13 DIAGNOSIS — D631 Anemia in chronic kidney disease: Secondary | ICD-10-CM | POA: Diagnosis not present

## 2016-02-13 DIAGNOSIS — D509 Iron deficiency anemia, unspecified: Secondary | ICD-10-CM | POA: Diagnosis not present

## 2016-02-13 DIAGNOSIS — N186 End stage renal disease: Secondary | ICD-10-CM | POA: Diagnosis not present

## 2016-02-14 DIAGNOSIS — D509 Iron deficiency anemia, unspecified: Secondary | ICD-10-CM | POA: Diagnosis not present

## 2016-02-14 DIAGNOSIS — D631 Anemia in chronic kidney disease: Secondary | ICD-10-CM | POA: Diagnosis not present

## 2016-02-14 DIAGNOSIS — N186 End stage renal disease: Secondary | ICD-10-CM | POA: Diagnosis not present

## 2016-02-24 DIAGNOSIS — D509 Iron deficiency anemia, unspecified: Secondary | ICD-10-CM | POA: Diagnosis not present

## 2016-02-24 DIAGNOSIS — D631 Anemia in chronic kidney disease: Secondary | ICD-10-CM | POA: Diagnosis not present

## 2016-02-24 DIAGNOSIS — N186 End stage renal disease: Secondary | ICD-10-CM | POA: Diagnosis not present

## 2016-02-26 DIAGNOSIS — E119 Type 2 diabetes mellitus without complications: Secondary | ICD-10-CM | POA: Diagnosis not present

## 2016-02-26 DIAGNOSIS — N186 End stage renal disease: Secondary | ICD-10-CM | POA: Diagnosis not present

## 2016-02-26 DIAGNOSIS — D631 Anemia in chronic kidney disease: Secondary | ICD-10-CM | POA: Diagnosis not present

## 2016-02-26 DIAGNOSIS — D509 Iron deficiency anemia, unspecified: Secondary | ICD-10-CM | POA: Diagnosis not present

## 2016-02-29 DIAGNOSIS — Z992 Dependence on renal dialysis: Secondary | ICD-10-CM | POA: Diagnosis not present

## 2016-02-29 DIAGNOSIS — N033 Chronic nephritic syndrome with diffuse mesangial proliferative glomerulonephritis: Secondary | ICD-10-CM | POA: Diagnosis not present

## 2016-02-29 DIAGNOSIS — N186 End stage renal disease: Secondary | ICD-10-CM | POA: Diagnosis not present

## 2016-03-04 DIAGNOSIS — N186 End stage renal disease: Secondary | ICD-10-CM | POA: Diagnosis not present

## 2016-03-04 DIAGNOSIS — D631 Anemia in chronic kidney disease: Secondary | ICD-10-CM | POA: Diagnosis not present

## 2016-03-04 DIAGNOSIS — D509 Iron deficiency anemia, unspecified: Secondary | ICD-10-CM | POA: Diagnosis not present

## 2016-03-06 DIAGNOSIS — D631 Anemia in chronic kidney disease: Secondary | ICD-10-CM | POA: Diagnosis not present

## 2016-03-06 DIAGNOSIS — D509 Iron deficiency anemia, unspecified: Secondary | ICD-10-CM | POA: Diagnosis not present

## 2016-03-06 DIAGNOSIS — N186 End stage renal disease: Secondary | ICD-10-CM | POA: Diagnosis not present

## 2016-03-16 DIAGNOSIS — D509 Iron deficiency anemia, unspecified: Secondary | ICD-10-CM | POA: Diagnosis not present

## 2016-03-16 DIAGNOSIS — N186 End stage renal disease: Secondary | ICD-10-CM | POA: Diagnosis not present

## 2016-03-16 DIAGNOSIS — D631 Anemia in chronic kidney disease: Secondary | ICD-10-CM | POA: Diagnosis not present

## 2016-03-23 DIAGNOSIS — D509 Iron deficiency anemia, unspecified: Secondary | ICD-10-CM | POA: Diagnosis not present

## 2016-03-23 DIAGNOSIS — D631 Anemia in chronic kidney disease: Secondary | ICD-10-CM | POA: Diagnosis not present

## 2016-03-23 DIAGNOSIS — N186 End stage renal disease: Secondary | ICD-10-CM | POA: Diagnosis not present

## 2016-03-25 DIAGNOSIS — N186 End stage renal disease: Secondary | ICD-10-CM | POA: Diagnosis not present

## 2016-03-25 DIAGNOSIS — D631 Anemia in chronic kidney disease: Secondary | ICD-10-CM | POA: Diagnosis not present

## 2016-03-25 DIAGNOSIS — D509 Iron deficiency anemia, unspecified: Secondary | ICD-10-CM | POA: Diagnosis not present

## 2016-03-31 DIAGNOSIS — Z992 Dependence on renal dialysis: Secondary | ICD-10-CM | POA: Diagnosis not present

## 2016-03-31 DIAGNOSIS — N033 Chronic nephritic syndrome with diffuse mesangial proliferative glomerulonephritis: Secondary | ICD-10-CM | POA: Diagnosis not present

## 2016-03-31 DIAGNOSIS — N186 End stage renal disease: Secondary | ICD-10-CM | POA: Diagnosis not present

## 2016-04-01 DIAGNOSIS — N186 End stage renal disease: Secondary | ICD-10-CM | POA: Diagnosis not present

## 2016-04-01 DIAGNOSIS — D631 Anemia in chronic kidney disease: Secondary | ICD-10-CM | POA: Diagnosis not present

## 2016-04-06 DIAGNOSIS — D631 Anemia in chronic kidney disease: Secondary | ICD-10-CM | POA: Diagnosis not present

## 2016-04-06 DIAGNOSIS — N186 End stage renal disease: Secondary | ICD-10-CM | POA: Diagnosis not present

## 2016-04-08 DIAGNOSIS — D631 Anemia in chronic kidney disease: Secondary | ICD-10-CM | POA: Diagnosis not present

## 2016-04-08 DIAGNOSIS — N186 End stage renal disease: Secondary | ICD-10-CM | POA: Diagnosis not present

## 2016-04-09 DIAGNOSIS — M5136 Other intervertebral disc degeneration, lumbar region: Secondary | ICD-10-CM | POA: Diagnosis not present

## 2016-04-09 DIAGNOSIS — Z5181 Encounter for therapeutic drug level monitoring: Secondary | ICD-10-CM | POA: Diagnosis not present

## 2016-04-09 DIAGNOSIS — Z79899 Other long term (current) drug therapy: Secondary | ICD-10-CM | POA: Diagnosis not present

## 2016-04-09 DIAGNOSIS — M5416 Radiculopathy, lumbar region: Secondary | ICD-10-CM | POA: Diagnosis not present

## 2016-04-09 DIAGNOSIS — G894 Chronic pain syndrome: Secondary | ICD-10-CM | POA: Diagnosis not present

## 2016-04-12 DIAGNOSIS — F329 Major depressive disorder, single episode, unspecified: Secondary | ICD-10-CM | POA: Diagnosis not present

## 2016-04-12 DIAGNOSIS — I10 Essential (primary) hypertension: Secondary | ICD-10-CM | POA: Diagnosis not present

## 2016-04-12 DIAGNOSIS — J449 Chronic obstructive pulmonary disease, unspecified: Secondary | ICD-10-CM | POA: Diagnosis not present

## 2016-04-12 DIAGNOSIS — G43009 Migraine without aura, not intractable, without status migrainosus: Secondary | ICD-10-CM | POA: Diagnosis not present

## 2016-04-14 DIAGNOSIS — N186 End stage renal disease: Secondary | ICD-10-CM | POA: Diagnosis not present

## 2016-04-14 DIAGNOSIS — D631 Anemia in chronic kidney disease: Secondary | ICD-10-CM | POA: Diagnosis not present

## 2016-04-15 DIAGNOSIS — Z881 Allergy status to other antibiotic agents status: Secondary | ICD-10-CM | POA: Diagnosis not present

## 2016-04-15 DIAGNOSIS — Z992 Dependence on renal dialysis: Secondary | ICD-10-CM | POA: Diagnosis not present

## 2016-04-15 DIAGNOSIS — D631 Anemia in chronic kidney disease: Secondary | ICD-10-CM | POA: Diagnosis not present

## 2016-04-15 DIAGNOSIS — N185 Chronic kidney disease, stage 5: Secondary | ICD-10-CM | POA: Diagnosis not present

## 2016-04-15 DIAGNOSIS — M5417 Radiculopathy, lumbosacral region: Secondary | ICD-10-CM | POA: Diagnosis not present

## 2016-04-15 DIAGNOSIS — I120 Hypertensive chronic kidney disease with stage 5 chronic kidney disease or end stage renal disease: Secondary | ICD-10-CM | POA: Diagnosis not present

## 2016-04-15 DIAGNOSIS — Z885 Allergy status to narcotic agent status: Secondary | ICD-10-CM | POA: Diagnosis not present

## 2016-04-15 DIAGNOSIS — G894 Chronic pain syndrome: Secondary | ICD-10-CM | POA: Diagnosis not present

## 2016-04-15 DIAGNOSIS — Z87891 Personal history of nicotine dependence: Secondary | ICD-10-CM | POA: Diagnosis not present

## 2016-04-15 DIAGNOSIS — Z886 Allergy status to analgesic agent status: Secondary | ICD-10-CM | POA: Diagnosis not present

## 2016-04-15 DIAGNOSIS — G4733 Obstructive sleep apnea (adult) (pediatric): Secondary | ICD-10-CM | POA: Diagnosis not present

## 2016-04-15 DIAGNOSIS — M5136 Other intervertebral disc degeneration, lumbar region: Secondary | ICD-10-CM | POA: Diagnosis not present

## 2016-04-16 DIAGNOSIS — N186 End stage renal disease: Secondary | ICD-10-CM | POA: Diagnosis not present

## 2016-04-16 DIAGNOSIS — D631 Anemia in chronic kidney disease: Secondary | ICD-10-CM | POA: Diagnosis not present

## 2016-04-23 DIAGNOSIS — D631 Anemia in chronic kidney disease: Secondary | ICD-10-CM | POA: Diagnosis not present

## 2016-04-23 DIAGNOSIS — N186 End stage renal disease: Secondary | ICD-10-CM | POA: Diagnosis not present

## 2016-04-24 DIAGNOSIS — D631 Anemia in chronic kidney disease: Secondary | ICD-10-CM | POA: Diagnosis not present

## 2016-04-24 DIAGNOSIS — N186 End stage renal disease: Secondary | ICD-10-CM | POA: Diagnosis not present

## 2016-04-29 DIAGNOSIS — D631 Anemia in chronic kidney disease: Secondary | ICD-10-CM | POA: Diagnosis not present

## 2016-04-29 DIAGNOSIS — N186 End stage renal disease: Secondary | ICD-10-CM | POA: Diagnosis not present

## 2016-04-30 DIAGNOSIS — N033 Chronic nephritic syndrome with diffuse mesangial proliferative glomerulonephritis: Secondary | ICD-10-CM | POA: Diagnosis not present

## 2016-04-30 DIAGNOSIS — Z992 Dependence on renal dialysis: Secondary | ICD-10-CM | POA: Diagnosis not present

## 2016-04-30 DIAGNOSIS — N186 End stage renal disease: Secondary | ICD-10-CM | POA: Diagnosis not present

## 2016-05-06 DIAGNOSIS — D631 Anemia in chronic kidney disease: Secondary | ICD-10-CM | POA: Diagnosis not present

## 2016-05-06 DIAGNOSIS — N186 End stage renal disease: Secondary | ICD-10-CM | POA: Diagnosis not present

## 2016-05-08 DIAGNOSIS — N186 End stage renal disease: Secondary | ICD-10-CM | POA: Diagnosis not present

## 2016-05-08 DIAGNOSIS — D631 Anemia in chronic kidney disease: Secondary | ICD-10-CM | POA: Diagnosis not present

## 2016-05-10 DIAGNOSIS — D631 Anemia in chronic kidney disease: Secondary | ICD-10-CM | POA: Diagnosis not present

## 2016-05-10 DIAGNOSIS — N186 End stage renal disease: Secondary | ICD-10-CM | POA: Diagnosis not present

## 2016-05-11 DIAGNOSIS — G894 Chronic pain syndrome: Secondary | ICD-10-CM | POA: Diagnosis not present

## 2016-05-11 DIAGNOSIS — M5417 Radiculopathy, lumbosacral region: Secondary | ICD-10-CM | POA: Diagnosis not present

## 2016-05-11 DIAGNOSIS — M5136 Other intervertebral disc degeneration, lumbar region: Secondary | ICD-10-CM | POA: Diagnosis not present

## 2016-05-11 DIAGNOSIS — M5416 Radiculopathy, lumbar region: Secondary | ICD-10-CM | POA: Diagnosis not present

## 2016-05-18 DIAGNOSIS — N186 End stage renal disease: Secondary | ICD-10-CM | POA: Diagnosis not present

## 2016-05-18 DIAGNOSIS — D631 Anemia in chronic kidney disease: Secondary | ICD-10-CM | POA: Diagnosis not present

## 2016-05-19 DIAGNOSIS — D631 Anemia in chronic kidney disease: Secondary | ICD-10-CM | POA: Diagnosis not present

## 2016-05-19 DIAGNOSIS — N186 End stage renal disease: Secondary | ICD-10-CM | POA: Diagnosis not present

## 2016-05-25 DIAGNOSIS — N186 End stage renal disease: Secondary | ICD-10-CM | POA: Diagnosis not present

## 2016-05-25 DIAGNOSIS — D631 Anemia in chronic kidney disease: Secondary | ICD-10-CM | POA: Diagnosis not present

## 2016-05-27 DIAGNOSIS — D631 Anemia in chronic kidney disease: Secondary | ICD-10-CM | POA: Diagnosis not present

## 2016-05-27 DIAGNOSIS — N186 End stage renal disease: Secondary | ICD-10-CM | POA: Diagnosis not present

## 2016-05-27 DIAGNOSIS — E119 Type 2 diabetes mellitus without complications: Secondary | ICD-10-CM | POA: Diagnosis not present

## 2016-05-31 DIAGNOSIS — Z992 Dependence on renal dialysis: Secondary | ICD-10-CM | POA: Diagnosis not present

## 2016-05-31 DIAGNOSIS — N186 End stage renal disease: Secondary | ICD-10-CM | POA: Diagnosis not present

## 2016-05-31 DIAGNOSIS — N033 Chronic nephritic syndrome with diffuse mesangial proliferative glomerulonephritis: Secondary | ICD-10-CM | POA: Diagnosis not present

## 2016-06-01 DIAGNOSIS — D631 Anemia in chronic kidney disease: Secondary | ICD-10-CM | POA: Diagnosis not present

## 2016-06-01 DIAGNOSIS — D509 Iron deficiency anemia, unspecified: Secondary | ICD-10-CM | POA: Diagnosis not present

## 2016-06-01 DIAGNOSIS — N186 End stage renal disease: Secondary | ICD-10-CM | POA: Diagnosis not present

## 2016-06-03 DIAGNOSIS — Z881 Allergy status to other antibiotic agents status: Secondary | ICD-10-CM | POA: Diagnosis not present

## 2016-06-03 DIAGNOSIS — N186 End stage renal disease: Secondary | ICD-10-CM | POA: Diagnosis not present

## 2016-06-03 DIAGNOSIS — J45909 Unspecified asthma, uncomplicated: Secondary | ICD-10-CM | POA: Diagnosis not present

## 2016-06-03 DIAGNOSIS — Z79891 Long term (current) use of opiate analgesic: Secondary | ICD-10-CM | POA: Diagnosis not present

## 2016-06-03 DIAGNOSIS — Z91018 Allergy to other foods: Secondary | ICD-10-CM | POA: Diagnosis not present

## 2016-06-03 DIAGNOSIS — Z886 Allergy status to analgesic agent status: Secondary | ICD-10-CM | POA: Diagnosis not present

## 2016-06-03 DIAGNOSIS — I120 Hypertensive chronic kidney disease with stage 5 chronic kidney disease or end stage renal disease: Secondary | ICD-10-CM | POA: Diagnosis not present

## 2016-06-03 DIAGNOSIS — Z87891 Personal history of nicotine dependence: Secondary | ICD-10-CM | POA: Diagnosis not present

## 2016-06-03 DIAGNOSIS — Z7951 Long term (current) use of inhaled steroids: Secondary | ICD-10-CM | POA: Diagnosis not present

## 2016-06-03 DIAGNOSIS — F329 Major depressive disorder, single episode, unspecified: Secondary | ICD-10-CM | POA: Diagnosis not present

## 2016-06-03 DIAGNOSIS — M1288 Other specific arthropathies, not elsewhere classified, other specified site: Secondary | ICD-10-CM | POA: Diagnosis not present

## 2016-06-03 DIAGNOSIS — Z6841 Body Mass Index (BMI) 40.0 and over, adult: Secondary | ICD-10-CM | POA: Diagnosis not present

## 2016-06-03 DIAGNOSIS — Z992 Dependence on renal dialysis: Secondary | ICD-10-CM | POA: Diagnosis not present

## 2016-06-03 DIAGNOSIS — F419 Anxiety disorder, unspecified: Secondary | ICD-10-CM | POA: Diagnosis not present

## 2016-06-03 DIAGNOSIS — Z79899 Other long term (current) drug therapy: Secondary | ICD-10-CM | POA: Diagnosis not present

## 2016-06-03 DIAGNOSIS — D631 Anemia in chronic kidney disease: Secondary | ICD-10-CM | POA: Diagnosis not present

## 2016-06-03 DIAGNOSIS — M5417 Radiculopathy, lumbosacral region: Secondary | ICD-10-CM | POA: Diagnosis not present

## 2016-06-03 DIAGNOSIS — G894 Chronic pain syndrome: Secondary | ICD-10-CM | POA: Diagnosis not present

## 2016-06-03 DIAGNOSIS — M5136 Other intervertebral disc degeneration, lumbar region: Secondary | ICD-10-CM | POA: Diagnosis not present

## 2016-06-03 DIAGNOSIS — Z885 Allergy status to narcotic agent status: Secondary | ICD-10-CM | POA: Diagnosis not present

## 2016-06-07 DIAGNOSIS — D509 Iron deficiency anemia, unspecified: Secondary | ICD-10-CM | POA: Diagnosis not present

## 2016-06-07 DIAGNOSIS — N186 End stage renal disease: Secondary | ICD-10-CM | POA: Diagnosis not present

## 2016-06-07 DIAGNOSIS — D631 Anemia in chronic kidney disease: Secondary | ICD-10-CM | POA: Diagnosis not present

## 2016-06-08 DIAGNOSIS — N186 End stage renal disease: Secondary | ICD-10-CM | POA: Diagnosis not present

## 2016-06-08 DIAGNOSIS — D631 Anemia in chronic kidney disease: Secondary | ICD-10-CM | POA: Diagnosis not present

## 2016-06-08 DIAGNOSIS — D509 Iron deficiency anemia, unspecified: Secondary | ICD-10-CM | POA: Diagnosis not present

## 2016-06-09 DIAGNOSIS — M5136 Other intervertebral disc degeneration, lumbar region: Secondary | ICD-10-CM | POA: Diagnosis not present

## 2016-06-09 DIAGNOSIS — G894 Chronic pain syndrome: Secondary | ICD-10-CM | POA: Diagnosis not present

## 2016-06-09 DIAGNOSIS — M5417 Radiculopathy, lumbosacral region: Secondary | ICD-10-CM | POA: Diagnosis not present

## 2016-06-09 DIAGNOSIS — M791 Myalgia: Secondary | ICD-10-CM | POA: Diagnosis not present

## 2016-06-10 DIAGNOSIS — D509 Iron deficiency anemia, unspecified: Secondary | ICD-10-CM | POA: Diagnosis not present

## 2016-06-10 DIAGNOSIS — N186 End stage renal disease: Secondary | ICD-10-CM | POA: Diagnosis not present

## 2016-06-10 DIAGNOSIS — D631 Anemia in chronic kidney disease: Secondary | ICD-10-CM | POA: Diagnosis not present

## 2016-06-12 DIAGNOSIS — D509 Iron deficiency anemia, unspecified: Secondary | ICD-10-CM | POA: Diagnosis not present

## 2016-06-12 DIAGNOSIS — N186 End stage renal disease: Secondary | ICD-10-CM | POA: Diagnosis not present

## 2016-06-12 DIAGNOSIS — D631 Anemia in chronic kidney disease: Secondary | ICD-10-CM | POA: Diagnosis not present

## 2016-06-15 DIAGNOSIS — N186 End stage renal disease: Secondary | ICD-10-CM | POA: Diagnosis not present

## 2016-06-15 DIAGNOSIS — D631 Anemia in chronic kidney disease: Secondary | ICD-10-CM | POA: Diagnosis not present

## 2016-06-15 DIAGNOSIS — D509 Iron deficiency anemia, unspecified: Secondary | ICD-10-CM | POA: Diagnosis not present

## 2016-06-21 ENCOUNTER — Emergency Department (HOSPITAL_COMMUNITY)
Admission: EM | Admit: 2016-06-21 | Discharge: 2016-06-22 | Disposition: A | Discharge status: Home / Self Care | Payer: Medicare Other | Attending: Emergency Medicine | Admitting: Emergency Medicine

## 2016-06-21 ENCOUNTER — Encounter (HOSPITAL_COMMUNITY): Payer: Self-pay

## 2016-06-21 DIAGNOSIS — J45909 Unspecified asthma, uncomplicated: Secondary | ICD-10-CM | POA: Insufficient documentation

## 2016-06-21 DIAGNOSIS — N186 End stage renal disease: Secondary | ICD-10-CM | POA: Insufficient documentation

## 2016-06-21 DIAGNOSIS — R4020 Unspecified coma: Secondary | ICD-10-CM

## 2016-06-21 DIAGNOSIS — R55 Syncope and collapse: Secondary | ICD-10-CM | POA: Diagnosis not present

## 2016-06-21 DIAGNOSIS — Z79899 Other long term (current) drug therapy: Secondary | ICD-10-CM | POA: Diagnosis not present

## 2016-06-21 DIAGNOSIS — D509 Iron deficiency anemia, unspecified: Secondary | ICD-10-CM | POA: Diagnosis not present

## 2016-06-21 DIAGNOSIS — H539 Unspecified visual disturbance: Secondary | ICD-10-CM | POA: Insufficient documentation

## 2016-06-21 DIAGNOSIS — D631 Anemia in chronic kidney disease: Secondary | ICD-10-CM | POA: Diagnosis not present

## 2016-06-21 DIAGNOSIS — H538 Other visual disturbances: Secondary | ICD-10-CM | POA: Diagnosis not present

## 2016-06-21 DIAGNOSIS — F17210 Nicotine dependence, cigarettes, uncomplicated: Secondary | ICD-10-CM | POA: Insufficient documentation

## 2016-06-21 DIAGNOSIS — I120 Hypertensive chronic kidney disease with stage 5 chronic kidney disease or end stage renal disease: Secondary | ICD-10-CM | POA: Diagnosis not present

## 2016-06-21 DIAGNOSIS — Z992 Dependence on renal dialysis: Secondary | ICD-10-CM | POA: Diagnosis not present

## 2016-06-21 LAB — I-STAT CHEM 8, ED
BUN: 44 mg/dL — ABNORMAL HIGH (ref 6–20)
CREATININE: 9.6 mg/dL — AB (ref 0.44–1.00)
Calcium, Ion: 1.05 mmol/L — ABNORMAL LOW (ref 1.13–1.30)
Chloride: 98 mmol/L — ABNORMAL LOW (ref 101–111)
Glucose, Bld: 129 mg/dL — ABNORMAL HIGH (ref 65–99)
HEMATOCRIT: 39 % (ref 36.0–46.0)
HEMOGLOBIN: 13.3 g/dL (ref 12.0–15.0)
POTASSIUM: 3.6 mmol/L (ref 3.5–5.1)
Sodium: 135 mmol/L (ref 135–145)
TCO2: 25 mmol/L (ref 0–100)

## 2016-06-21 LAB — CBC
HCT: 39.1 % (ref 36.0–46.0)
Hemoglobin: 12.2 g/dL (ref 12.0–15.0)
MCH: 31.8 pg (ref 26.0–34.0)
MCHC: 31.2 g/dL (ref 30.0–36.0)
MCV: 101.8 fL — ABNORMAL HIGH (ref 78.0–100.0)
Platelets: 195 10*3/uL (ref 150–400)
RBC: 3.84 MIL/uL — ABNORMAL LOW (ref 3.87–5.11)
RDW: 15.5 % (ref 11.5–15.5)
WBC: 8.7 10*3/uL (ref 4.0–10.5)

## 2016-06-21 LAB — BASIC METABOLIC PANEL
ANION GAP: 14 (ref 5–15)
BUN: 46 mg/dL — ABNORMAL HIGH (ref 6–20)
CO2: 27 mmol/L (ref 22–32)
Calcium: 9.8 mg/dL (ref 8.9–10.3)
Chloride: 94 mmol/L — ABNORMAL LOW (ref 101–111)
Creatinine, Ser: 9.67 mg/dL — ABNORMAL HIGH (ref 0.44–1.00)
GFR calc Af Amer: 5 mL/min — ABNORMAL LOW (ref >60)
GFR, EST NON AFRICAN AMERICAN: 4 mL/min — AB (ref >60)
Glucose, Bld: 134 mg/dL — ABNORMAL HIGH (ref 65–99)
Potassium: 3.7 mmol/L (ref 3.5–5.1)
Sodium: 135 mmol/L (ref 135–145)

## 2016-06-21 LAB — CBG MONITORING, ED: Glucose-Capillary: 137 mg/dL — ABNORMAL HIGH (ref 65–99)

## 2016-06-21 LAB — I-STAT TROPONIN, ED: TROPONIN I, POC: 0 ng/mL (ref 0.00–0.08)

## 2016-06-21 NOTE — ED Notes (Signed)
Patient CBG is 137

## 2016-06-21 NOTE — ED Notes (Signed)
Dr. Horton at bedside. 

## 2016-06-21 NOTE — ED Provider Notes (Signed)
[facility] DEPT Provider Note   CSN: [id number] Arrival date & time: 06/21/16  2226  By signing my name below, I, Emmanuella Mensah, attest that this documentation has been prepared under the direction and in the presence of Merryl Hacker, MD. Electronically Signed: Judithann Sauger, ED Scribe. 06/22/16. 12:22 AM.    History   Chief Complaint Chief Complaint  Patient presents with  . Loss of Consciousness  . Dizziness    HPI Comments: [name] is a 45 y.o. female with a hx of Anemia, chronic kidney disease, ESRD, and hypertension who presents to the Emergency Department for evaluation s/p "passing away" 3 days ago. Pt explains that she fell 4 days ago with positive LOC. She states that she injured her "entire left side" during the fall. However, pt was unable to specify the type of pain. She denies that she was seen by a provider after the fall. She adds that 3 days ago, she "died for a few minutes" after collapsing. She states that ever since that episode she has not "been feeling right". Patient reports generalized weakness and difficulty ambulating; however, she did ambulate with assistance from her home to the car. She also reports left visual field deficit. She states "I can't see anything." She denies a hx of seizures. No alleviating factors noted. Pt's last dialysis was 3 days ago. No fever, chills, abdominal pain, or n/v.   The history is provided by the patient. No language interpreter was used.    Past Medical History:  Diagnosis Date  . ADD (attention deficit disorder)   . Anemia   . Anemia   . Anxiety   . Arthritis    knee, wrist, back   . Asthma   . Blood transfusion 06/2011   6 units transfused at [facility]  . Bronchitis   . Chronic kidney disease    Atrophic left kidney, sees Dr. Hassell Done  . Depression   . Dizziness   . Gastritis   . GERD (gastroesophageal reflux disease)   . Gout   . Headache(784.0)   . Hypertension    sees Dr. Wendie Agreste    . Neuromuscular disorder (HCC)    carpal tunnel  . Obesity   . Reflux   . Renal insufficiency   . Shortness of breath   . Sleep apnea    does not use CPAP    Patient Active Problem List   Diagnosis Date Noted  . Sepsis ([place]) 02/15/2014  . Cellulitis 02/14/2014  . Thinning of skin 04/09/2013  . Mechanical complication of other vascular device, implant, and graft 04/09/2013  . Pain, limb, left 10/18/2012  . Swollen 10/18/2012  . Status post endometrial ablation 12/10/2011  . End stage renal disease ([place]) 11/24/2011  . Renal failure (ARF), acute on chronic (HCC) 10/19/2011  . HTN (hypertension) 10/19/2011  . Anemia 10/19/2011    Past Surgical History:  Procedure Laterality Date  . AV FISTULA PLACEMENT  06/10/11   Left brachiocephalic AVF  . AV Fistula surgery  11/30/11   [facility] - redo fistula  . Carpel tunnel release     x 2; bilateral  . CESAREAN SECTION    . colonscopy    . DILATION AND CURETTAGE OF UTERUS    . HYSTEROSCOPY WITH THERMACHOICE  12/10/2011   Procedure: HYSTEROSCOPY WITH THERMACHOICE;  Surgeon: Betsy Coder, MD;  Location: [facility];  Service: Gynecology;  Laterality: N/A;  . IRRIGATION AND DEBRIDEMENT ABSCESS Left 02/15/2014   Procedure: IRRIGATION AND DRAINAGE OF  LOW BACK ABSCESS;  Surgeon: Imogene Burn. Georgette Dover, MD;  Location: [facility];  Service: General;  Laterality: Left;  . microdiskectomy  04/2001   Herniated disk L5-S1  . RENAL BIOPSY  04/2004  . SHUNTOGRAM  Dec. 18, 2013   Left arm  . SHUNTOGRAM N/A 10/18/2012   Procedure: Earney Mallet;  Surgeon: Conrad Tracy, MD;  Location: [facility] CATH LAB;  Service: Cardiovascular;  Laterality: N/A;  . SHUNTOGRAM Left 04/18/2013   Procedure: Earney Mallet;  Surgeon: Serafina Mitchell, MD;  Location: [facility] CATH LAB;  Service: Cardiovascular;  Laterality: Left;  . SPINE SURGERY  2004   Lumbar diskectomy   . UPPER GASTROINTESTINAL ENDOSCOPY    . WISDOM TOOTH EXTRACTION      OB History    No data available       Home  Medications    Prior to Admission medications   Medication Sig Start Date End Date Taking? Authorizing Provider  albuterol (PROVENTIL HFA;VENTOLIN HFA) 108 (90 BASE) MCG/ACT inhaler Inhale 2 puffs into the lungs every 6 (six) hours as needed. For shortness of breath     Historical Provider, MD  allopurinol (ZYLOPRIM) 300 MG tablet Take 300 mg by mouth at bedtime.     Historical Provider, MD  ALPRAZolam Duanne Moron) 0.5 MG tablet Take 0.5 mg by mouth 3 (three) times daily.     Historical Provider, MD  amLODipine (NORVASC) 10 MG tablet Take 10 mg by mouth daily. 02/08/14   Historical Provider, MD  budesonide-formoterol (SYMBICORT) 160-4.5 MCG/ACT inhaler Inhale 2 puffs into the lungs 2 (two) times daily.    Historical Provider, MD  ciprofloxacin (CIPRO) 500 MG tablet Take 1 tablet (500 mg total) by mouth daily. On dialysis day, and take after your dialysis is completed. Patient not taking: Reported on 01/31/2015 [id number]   Delora Fuel, MD  cyclobenzaprine (FLEXERIL) 10 MG tablet Take 10 mg by mouth 2 (two) times daily as needed for muscle spasms. For muscle spasms    Historical Provider, MD  divalproex (DEPAKOTE) 500 MG DR tablet Take 1 tablet (500 mg total) by mouth 3 (three) times daily. 06/22/16   Merryl Hacker, MD  DULoxetine (CYMBALTA) 60 MG capsule Take 60 mg by mouth daily.    Historical Provider, MD  esomeprazole (NEXIUM) 40 MG capsule Take 40 mg by mouth daily.     Historical Provider, MD  furosemide (LASIX) 80 MG tablet Take 80 mg by mouth 2 (two) times daily.     Historical Provider, MD  HYDROcodone-acetaminophen (NORCO) 10-325 MG per tablet Take 1 tablet by mouth every 6 (six) hours as needed for moderate pain.    Historical Provider, MD  lidocaine-prilocaine (EMLA) cream Apply 1 application topically 3 (three) times a week. For dialysis port.    Historical Provider, MD  lisinopril (PRINIVIL,ZESTRIL) 40 MG tablet Take 40 mg by mouth daily.    Historical Provider, MD  methocarbamol (ROBAXIN)  500 MG tablet Take 500 mg by mouth 2 (two) times daily.  03/22/13   Historical Provider, MD  metroNIDAZOLE (FLAGYL) 500 MG tablet Take 1 tablet (500 mg total) by mouth 2 (two) times daily. [id number]   Delora Fuel, MD  montelukast (SINGULAIR) 10 MG tablet Take 10 mg by mouth every morning.     Historical Provider, MD  multivitamin (RENA-VIT) TABS tablet Take 1 tablet by mouth daily.    Historical Provider, MD  oxymorphone (OPANA ER) 30 MG 12 hr tablet Take 30 mg by mouth every 12 (twelve) hours.    Historical  Provider, MD  prochlorperazine (COMPAZINE) 5 MG tablet Take 5 mg by mouth daily as needed for nausea.  01/08/14   Historical Provider, MD  promethazine (PHENERGAN) 25 MG tablet Take 25 mg by mouth every 6 (six) hours as needed. For nausea    Historical Provider, MD  saccharomyces boulardii (FLORASTOR) 250 MG capsule Take 1 capsule (250 mg total) by mouth 2 (two) times daily. Patient not taking: Reported on 01/31/2015 02/17/14   Verlee Monte, MD  sevelamer carbonate (RENVELA) 800 MG tablet Take 800 mg by mouth 3 (three) times daily with meals.    Historical Provider, MD  SUMAtriptan (IMITREX) 50 MG tablet Take 50 mg by mouth daily as needed. For migraines 02/14/14   Historical Provider, MD    Family History Family History  Problem Relation Age of Onset  . Hypertension Mother   . Heart disease Father     Heart Disease before age 73  . Hypertension Father   . Heart attack Father   . Hypertension Daughter     Social History Social History  Substance Use Topics  . Smoking status: Current Every Day Smoker    Packs/day: 1.00    Years: 10.00    Types: Cigarettes  . Smokeless tobacco: Never Used  . Alcohol use Yes     Comment: ocassional     Allergies   Nsaids; Clarithromycin; Demerol; Dilaudid [hydromorphone hcl]; Fentanyl; Morphine and related; Other; and Vancomycin   Review of Systems Review of Systems  Constitutional: Negative for chills and fever.  Respiratory: Negative for  shortness of breath.   Cardiovascular: Negative for chest pain.  Gastrointestinal: Negative for abdominal pain, nausea and vomiting.  Musculoskeletal: Positive for myalgias.  Neurological: Positive for syncope and weakness. Negative for dizziness and headaches.  All other systems reviewed and are negative.    Physical Exam Updated Vital Signs BP 138/92   Pulse 73   Temp 98.4 F (36.9 C) (Oral)   Resp 11   SpO2 95%   Physical Exam  Constitutional: She is oriented to person, place, and time.  Chronically ill-appearing, no acute distress, tracks me upon arrival into the room  HENT:  Head: Normocephalic and atraumatic.  Eyes: EOM are normal. Pupils are equal, round, and reactive to light.  Neck: Neck supple.  Cardiovascular: Normal rate, regular rhythm and normal heart sounds.   No murmur heard. Pulmonary/Chest: Effort normal and breath sounds normal. No respiratory distress. She has no wheezes.  Abdominal: Soft. There is no tenderness.  Musculoskeletal: She exhibits edema.  Neurological: She is alert and oriented to person, place, and time.  Exam limited secondary to patient effort.  Tracks me upon arrival into the room; however, will not follow my finger, blink to threat intact bilaterally, no clonus bilaterally, no dysmetria to finger-nose-finger, 4+ out of 5 grip strength bilaterally, 4+ out of 5 proximal muscle upper extremity strength, patient reports that she is unable to move her toes or her legs.  Skin: Skin is warm and dry.  Psychiatric: She has a normal mood and affect.  Nursing note and vitals reviewed.    ED Treatments / Results  DIAGNOSTIC STUDIES: Oxygen Saturation is 100% on RA, normal by my interpretation.    COORDINATION OF CARE: 12:06 AM- Pt advised of plan for treatment and pt agrees. She will receive lab work, CT head, and EKG for further evaluation.    Labs (all labs ordered are listed, but only abnormal results are displayed) Labs Reviewed  BASIC  METABOLIC PANEL -  Abnormal; Notable for the following:       Result Value   Chloride 94 (*)    Glucose, Bld 134 (*)    BUN 46 (*)    Creatinine, Ser 9.67 (*)    GFR calc non Af Amer 4 (*)    GFR calc Af Amer 5 (*)    All other components within normal limits  CBC - Abnormal; Notable for the following:    RBC 3.84 (*)    MCV 101.8 (*)    All other components within normal limits  URINALYSIS, ROUTINE W REFLEX MICROSCOPIC (NOT AT [facility]) - Abnormal; Notable for the following:    Protein, ur 100 (*)    All other components within normal limits  URINE RAPID DRUG SCREEN, HOSP PERFORMED - Abnormal; Notable for the following:    Opiates POSITIVE (*)    All other components within normal limits  HEPATIC FUNCTION PANEL - Abnormal; Notable for the following:    Total Protein 5.9 (*)    Albumin 3.4 (*)    Total Bilirubin 1.6 (*)    Indirect Bilirubin 1.2 (*)    All other components within normal limits  URINE MICROSCOPIC-ADD ON - Abnormal; Notable for the following:    Squamous Epithelial / LPF 0-5 (*)    Bacteria, UA RARE (*)    All other components within normal limits  CBG MONITORING, ED - Abnormal; Notable for the following:    Glucose-Capillary 137 (*)    All other components within normal limits  I-STAT CHEM 8, ED - Abnormal; Notable for the following:    Chloride 98 (*)    BUN 44 (*)    Creatinine, Ser 9.60 (*)    Glucose, Bld 129 (*)    Calcium, Ion 1.05 (*)    All other components within normal limits  I-STAT TROPOININ, ED    EKG  EKG Interpretation  Date/Time:  Monday June 21 2016 22:37:13 EDT Ventricular Rate:  106 PR Interval:  178 QRS Duration: 94 QT Interval:  368 QTC Calculation: 488 R Axis:   -32 Text Interpretation:  Sinus tachycardia Left axis deviation Cannot rule out Anterior infarct , age undetermined Abnormal ECG Confirmed by Mabry Santarelli  MD, Niccole Witthuhn ([id number]) on 06/21/2016 11:07:59 PM       Radiology Ct Head Wo Contrast  Result Date: 06/22/2016 CLINICAL  DATA:  Syncope leading to fall with vision changes. Left arm pain. EXAM: CT HEAD WITHOUT CONTRAST TECHNIQUE: Contiguous axial images were obtained from the base of the skull through the vertex without intravenous contrast. COMPARISON:  Head CT 06/16/2011 FINDINGS: Brain: No evidence of acute infarction, hemorrhage, hydrocephalus, extra-axial collection or mass lesion/mass effect. The previous focal dilatation of the temporal horn of the left lateral ventricle is less prominent on the current exam. Vascular: No hyperdense vessel or unexpected calcification. Atherosclerosis of skullbase vasculature including the basilar artery Skull: No fracture or focal lesion. Sclerotic appearance of the calvarium, patient with history of chronic kidney disease, likely renal osteodystrophy. Sinuses/Orbits: No acute finding. Other: None. IMPRESSION: 1.  No acute intracranial abnormality. 2. Atherosclerosis of skullbase vasculature, advanced for age. Electronically Signed   By: Jeb Levering M.D.   On: 06/22/2016 01:10   Mr Brain Wo Contrast  Result Date: 06/22/2016 CLINICAL DATA:  45 y/o F; fall with loss of consciousness 4 days ago. EXAM: MRI HEAD WITHOUT CONTRAST TECHNIQUE: Multiplanar, multiecho pulse sequences of the brain and surrounding structures were obtained without intravenous contrast. COMPARISON:  CT of head dated 06/22/2016.  FINDINGS: Brain: No diffusion restriction to suggest acute or early subacute infarct. No abnormal susceptibility hypointensity to indicate intracranial hemorrhage. No significant T2 FLAIR signal abnormality. No focal mass effect. Rounded medially displaced and vertically-oriented left hippocampus consistent with incomplete inversion/hippocampal malrotation (series 11, image 13). There are often associated findings of asymmetric prominence of temporal horn of left lateral ventricle and a vertical collateral sulcus (series 11, image 10). Extra-axial space: No hydrocephalus. No midline shift. No  effacement of basilar cisterns. No extra-axial collection is identified. Proximal intracranial flow voids are maintained. No abnormality of the cervical medullary junction. Other: No abnormal signal of the paranasal sinuses. No abnormal signal of the mastoid air cells. Orbits are unremarkable. Calvarium is unremarkable. IMPRESSION: 1. No acute intracranial abnormality is identified. 2. 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 This finding can be associated with seizures. Clinical correlation is recommended. Electronically Signed   By: Kristine Garbe M.D.   On: 06/22/2016 04:02    Procedures Procedures (including critical care time)  Medications Ordered in ED Medications - No data to display   Initial Impression / Assessment and Plan / ED Course  Merryl Hacker, MD has reviewed the triage vital signs and the nursing notes.  Pertinent labs & imaging results that were available during my care of the patient were reviewed by me and considered in my medical decision making (see chart for details).  Clinical Course  Comment By Time  INformed patient of MRI results.  Discussed with Dr. Rochele Pages MRI malrotation and risk for seizures.  Will add LFTs and start depakote.  Patient observed moving all 4 ext without difficulty and continues to track appropriately. Merryl Hacker, MD 08/22 [phone]    Patient presents with episode of loss of consciousness, fall, and now with neurologic complaints including vision difficulties and weakness. I question her effort on exam. She can obviously track but will not do extraocular movements. Lab work obtained. She last dialyzed today. MRI obtained and as above. Per Dr. Nicole Kindred, will add on LFTs and initiate Depakote. She'll need follow-up with outpatient neurology. She is able to ambulate independently and her neuro exam appears to be improving.  After history, exam, and medical workup I feel the patient has been appropriately  medically screened and is safe for discharge home. Pertinent diagnoses were discussed with the patient. Patient was given return precautions.   Final Clinical Impressions(s) / ED Diagnoses   Final diagnoses:  Loss of consciousness  Visual disturbance    New Prescriptions New Prescriptions   DIVALPROEX (DEPAKOTE) 500 MG DR TABLET    Take 1 tablet (500 mg total) by mouth 3 (three) times daily.   I personally performed the services described in this documentation, which was scribed in my presence. The recorded information has been reviewed and is accurate.    Merryl Hacker, MD 06/22/16 [phone]

## 2016-06-21 NOTE — ED Notes (Signed)
Patient very vague when discussing what is going on. Patient states she died 02/24/2023, had convulsions and "know she died," but will not elaborate any further. She reports passing out and hitting her L side on the coffee table. No signs of injury to head/neck. C/o L arm pain and "not being able to see right, left eye is hazy, and she cannot feel with her left arm." Patient A&O x 4, moves all extremities but states she can't move her left arm because it hurts.

## 2016-06-21 NOTE — ED Notes (Signed)
Pt states dialisys, normally on Tuesday, Thursday, Saturday. Last full treatment Tuesday.

## 2016-06-21 NOTE — ED Notes (Signed)
Patient called out, requesting RN. This RN went in room and asked how I could help her. Patient stated, "If someone doesn't come check me out, I'm gonna die." This RN explained that I have checked her out, reassuring her that her VS are all normal and stable, she is alert and talking." Patient then said that something was very wrong with her brain and her veins. Attempted to check patient's sight, and upon asking patient questions, she just stares at RN and will not say anything further. MD made aware.

## 2016-06-21 NOTE — ED Notes (Signed)
Pt had syncopal event in triage. Pt listing to R, legs outstretched. Pt not responding to verbal stimuli. Pt with palpable pulse, good color. Event lasted ~ 20s.

## 2016-06-21 NOTE — ED Triage Notes (Signed)
Pt states had syncopal event on Thursday. Pt states fell onto left arm. Pt complaining of L arm pain. Pt states "I died on 02-03-2023." RN asked for clarification, pt repeated "I went into cardiac arrest on 02/03/23 and didn't tell anybody." Pt states "havent felt right since." Pt complaining of weakness and generalized fatigue.

## 2016-06-21 NOTE — ED Notes (Signed)
Dr Beaton at bedside 

## 2016-06-22 ENCOUNTER — Emergency Department (HOSPITAL_COMMUNITY): Payer: Medicare Other

## 2016-06-22 DIAGNOSIS — R55 Syncope and collapse: Secondary | ICD-10-CM | POA: Diagnosis not present

## 2016-06-22 LAB — URINALYSIS, ROUTINE W REFLEX MICROSCOPIC
Bilirubin Urine: NEGATIVE
Glucose, UA: NEGATIVE mg/dL
Hgb urine dipstick: NEGATIVE
Ketones, ur: NEGATIVE mg/dL
LEUKOCYTES UA: NEGATIVE
Nitrite: NEGATIVE
PROTEIN: 100 mg/dL — AB
SPECIFIC GRAVITY, URINE: 1.011 (ref 1.005–1.030)
pH: 8 (ref 5.0–8.0)

## 2016-06-22 LAB — RAPID URINE DRUG SCREEN, HOSP PERFORMED
Amphetamines: NOT DETECTED
Barbiturates: NOT DETECTED
Benzodiazepines: NOT DETECTED
Cocaine: NOT DETECTED
Opiates: POSITIVE — AB
TETRAHYDROCANNABINOL: NOT DETECTED

## 2016-06-22 LAB — URINE MICROSCOPIC-ADD ON: RBC / HPF: NONE SEEN RBC/hpf (ref 0–5)

## 2016-06-22 LAB — HEPATIC FUNCTION PANEL
ALBUMIN: 3.4 g/dL — AB (ref 3.5–5.0)
ALK PHOS: 73 U/L (ref 38–126)
ALT: 15 U/L (ref 14–54)
AST: 26 U/L (ref 15–41)
BILIRUBIN TOTAL: 1.6 mg/dL — AB (ref 0.3–1.2)
Bilirubin, Direct: 0.4 mg/dL (ref 0.1–0.5)
Indirect Bilirubin: 1.2 mg/dL — ABNORMAL HIGH (ref 0.3–0.9)
Total Protein: 5.9 g/dL — ABNORMAL LOW (ref 6.5–8.1)

## 2016-06-22 MED ORDER — DIVALPROEX SODIUM 500 MG PO DR TAB: 500.0000 mg | DELAYED_RELEASE_TABLET | Freq: Three times a day (TID) | ORAL | 0 refills | Status: DC

## 2016-06-22 NOTE — ED Notes (Signed)
Patient transported to MRI 

## 2016-06-22 NOTE — ED Notes (Signed)
Pt said she needed to use the bathroom. I went in the room and asked if she needed help to the bathroom she stated she could not walk . I asked if she could use the bed pan she said yes , she rolled over for me to place her on the bed pan she used her legs to roll on to her side without any help fer this tech.

## 2016-06-22 NOTE — ED Notes (Signed)
Patient appeared to be unresponsive. However, Patient responded to painful stimuli and became more responsive.

## 2016-06-22 NOTE — Discharge Instructions (Signed)
You were seen today for an episode of loss of consciousness and weakness as well as vision changes. It is unclear what caused the; however, you may have had a seizure. He will be started on seizure medication. you need follow-up with neurology.

## 2016-06-22 NOTE — ED Notes (Signed)
While taking pt off of bedpan pt lifter her bottom up with her legs.

## 2016-06-22 NOTE — ED Notes (Signed)
Pt aware of need for urine. Pt currently on the bedpan trying to go.

## 2016-06-23 DIAGNOSIS — N186 End stage renal disease: Secondary | ICD-10-CM | POA: Diagnosis not present

## 2016-06-23 DIAGNOSIS — D509 Iron deficiency anemia, unspecified: Secondary | ICD-10-CM | POA: Diagnosis not present

## 2016-06-23 DIAGNOSIS — D631 Anemia in chronic kidney disease: Secondary | ICD-10-CM | POA: Diagnosis not present

## 2016-06-24 DIAGNOSIS — N186 End stage renal disease: Secondary | ICD-10-CM | POA: Diagnosis not present

## 2016-06-24 DIAGNOSIS — D631 Anemia in chronic kidney disease: Secondary | ICD-10-CM | POA: Diagnosis not present

## 2016-06-24 DIAGNOSIS — D509 Iron deficiency anemia, unspecified: Secondary | ICD-10-CM | POA: Diagnosis not present

## 2016-07-01 DIAGNOSIS — Z992 Dependence on renal dialysis: Secondary | ICD-10-CM | POA: Diagnosis not present

## 2016-07-01 DIAGNOSIS — N186 End stage renal disease: Secondary | ICD-10-CM | POA: Diagnosis not present

## 2016-07-01 DIAGNOSIS — N033 Chronic nephritic syndrome with diffuse mesangial proliferative glomerulonephritis: Secondary | ICD-10-CM | POA: Diagnosis not present

## 2016-07-03 DIAGNOSIS — D509 Iron deficiency anemia, unspecified: Secondary | ICD-10-CM | POA: Diagnosis not present

## 2016-07-03 DIAGNOSIS — N186 End stage renal disease: Secondary | ICD-10-CM | POA: Diagnosis not present

## 2016-07-03 DIAGNOSIS — D631 Anemia in chronic kidney disease: Secondary | ICD-10-CM | POA: Diagnosis not present

## 2016-07-06 DIAGNOSIS — D509 Iron deficiency anemia, unspecified: Secondary | ICD-10-CM | POA: Diagnosis not present

## 2016-07-06 DIAGNOSIS — D631 Anemia in chronic kidney disease: Secondary | ICD-10-CM | POA: Diagnosis not present

## 2016-07-06 DIAGNOSIS — N186 End stage renal disease: Secondary | ICD-10-CM | POA: Diagnosis not present

## 2016-07-13 DIAGNOSIS — D631 Anemia in chronic kidney disease: Secondary | ICD-10-CM | POA: Diagnosis not present

## 2016-07-13 DIAGNOSIS — N186 End stage renal disease: Secondary | ICD-10-CM | POA: Diagnosis not present

## 2016-07-13 DIAGNOSIS — D509 Iron deficiency anemia, unspecified: Secondary | ICD-10-CM | POA: Diagnosis not present

## 2016-07-15 DIAGNOSIS — D631 Anemia in chronic kidney disease: Secondary | ICD-10-CM | POA: Diagnosis not present

## 2016-07-15 DIAGNOSIS — N186 End stage renal disease: Secondary | ICD-10-CM | POA: Diagnosis not present

## 2016-07-15 DIAGNOSIS — D509 Iron deficiency anemia, unspecified: Secondary | ICD-10-CM | POA: Diagnosis not present

## 2016-07-20 DIAGNOSIS — D631 Anemia in chronic kidney disease: Secondary | ICD-10-CM | POA: Diagnosis not present

## 2016-07-20 DIAGNOSIS — D509 Iron deficiency anemia, unspecified: Secondary | ICD-10-CM | POA: Diagnosis not present

## 2016-07-20 DIAGNOSIS — N186 End stage renal disease: Secondary | ICD-10-CM | POA: Diagnosis not present

## 2016-07-22 DIAGNOSIS — N186 End stage renal disease: Secondary | ICD-10-CM | POA: Diagnosis not present

## 2016-07-22 DIAGNOSIS — D509 Iron deficiency anemia, unspecified: Secondary | ICD-10-CM | POA: Diagnosis not present

## 2016-07-22 DIAGNOSIS — D631 Anemia in chronic kidney disease: Secondary | ICD-10-CM | POA: Diagnosis not present

## 2016-07-29 DIAGNOSIS — D631 Anemia in chronic kidney disease: Secondary | ICD-10-CM | POA: Diagnosis not present

## 2016-07-29 DIAGNOSIS — D509 Iron deficiency anemia, unspecified: Secondary | ICD-10-CM | POA: Diagnosis not present

## 2016-07-29 DIAGNOSIS — N186 End stage renal disease: Secondary | ICD-10-CM | POA: Diagnosis not present

## 2016-07-31 DIAGNOSIS — N033 Chronic nephritic syndrome with diffuse mesangial proliferative glomerulonephritis: Secondary | ICD-10-CM | POA: Diagnosis not present

## 2016-07-31 DIAGNOSIS — Z992 Dependence on renal dialysis: Secondary | ICD-10-CM | POA: Diagnosis not present

## 2016-07-31 DIAGNOSIS — D631 Anemia in chronic kidney disease: Secondary | ICD-10-CM | POA: Diagnosis not present

## 2016-07-31 DIAGNOSIS — N186 End stage renal disease: Secondary | ICD-10-CM | POA: Diagnosis not present

## 2016-07-31 DIAGNOSIS — D509 Iron deficiency anemia, unspecified: Secondary | ICD-10-CM | POA: Diagnosis not present

## 2016-08-03 DIAGNOSIS — D631 Anemia in chronic kidney disease: Secondary | ICD-10-CM | POA: Diagnosis not present

## 2016-08-03 DIAGNOSIS — D509 Iron deficiency anemia, unspecified: Secondary | ICD-10-CM | POA: Diagnosis not present

## 2016-08-03 DIAGNOSIS — N186 End stage renal disease: Secondary | ICD-10-CM | POA: Diagnosis not present

## 2016-08-03 DIAGNOSIS — Z23 Encounter for immunization: Secondary | ICD-10-CM | POA: Diagnosis not present

## 2016-08-04 DIAGNOSIS — J449 Chronic obstructive pulmonary disease, unspecified: Secondary | ICD-10-CM | POA: Diagnosis not present

## 2016-08-04 DIAGNOSIS — G43009 Migraine without aura, not intractable, without status migrainosus: Secondary | ICD-10-CM | POA: Diagnosis not present

## 2016-08-04 DIAGNOSIS — F329 Major depressive disorder, single episode, unspecified: Secondary | ICD-10-CM | POA: Diagnosis not present

## 2016-08-04 DIAGNOSIS — I10 Essential (primary) hypertension: Secondary | ICD-10-CM | POA: Diagnosis not present

## 2016-08-05 DIAGNOSIS — N186 End stage renal disease: Secondary | ICD-10-CM | POA: Diagnosis not present

## 2016-08-05 DIAGNOSIS — M5136 Other intervertebral disc degeneration, lumbar region: Secondary | ICD-10-CM | POA: Diagnosis not present

## 2016-08-05 DIAGNOSIS — G894 Chronic pain syndrome: Secondary | ICD-10-CM | POA: Diagnosis not present

## 2016-08-05 DIAGNOSIS — M791 Myalgia: Secondary | ICD-10-CM | POA: Diagnosis not present

## 2016-08-05 DIAGNOSIS — M5417 Radiculopathy, lumbosacral region: Secondary | ICD-10-CM | POA: Diagnosis not present

## 2016-08-14 DIAGNOSIS — D509 Iron deficiency anemia, unspecified: Secondary | ICD-10-CM | POA: Diagnosis not present

## 2016-08-14 DIAGNOSIS — N186 End stage renal disease: Secondary | ICD-10-CM | POA: Diagnosis not present

## 2016-08-14 DIAGNOSIS — E875 Hyperkalemia: Secondary | ICD-10-CM | POA: Diagnosis not present

## 2016-08-14 DIAGNOSIS — D631 Anemia in chronic kidney disease: Secondary | ICD-10-CM | POA: Diagnosis not present

## 2016-08-14 DIAGNOSIS — Z23 Encounter for immunization: Secondary | ICD-10-CM | POA: Diagnosis not present

## 2016-08-17 DIAGNOSIS — D509 Iron deficiency anemia, unspecified: Secondary | ICD-10-CM | POA: Diagnosis not present

## 2016-08-17 DIAGNOSIS — Z23 Encounter for immunization: Secondary | ICD-10-CM | POA: Diagnosis not present

## 2016-08-17 DIAGNOSIS — N186 End stage renal disease: Secondary | ICD-10-CM | POA: Diagnosis not present

## 2016-08-17 DIAGNOSIS — D631 Anemia in chronic kidney disease: Secondary | ICD-10-CM | POA: Diagnosis not present

## 2016-08-21 DIAGNOSIS — D631 Anemia in chronic kidney disease: Secondary | ICD-10-CM | POA: Diagnosis not present

## 2016-08-21 DIAGNOSIS — Z23 Encounter for immunization: Secondary | ICD-10-CM | POA: Diagnosis not present

## 2016-08-21 DIAGNOSIS — D509 Iron deficiency anemia, unspecified: Secondary | ICD-10-CM | POA: Diagnosis not present

## 2016-08-21 DIAGNOSIS — N186 End stage renal disease: Secondary | ICD-10-CM | POA: Diagnosis not present

## 2016-08-26 DIAGNOSIS — N186 End stage renal disease: Secondary | ICD-10-CM | POA: Diagnosis not present

## 2016-08-26 DIAGNOSIS — D631 Anemia in chronic kidney disease: Secondary | ICD-10-CM | POA: Diagnosis not present

## 2016-08-26 DIAGNOSIS — D509 Iron deficiency anemia, unspecified: Secondary | ICD-10-CM | POA: Diagnosis not present

## 2016-08-26 DIAGNOSIS — E119 Type 2 diabetes mellitus without complications: Secondary | ICD-10-CM | POA: Diagnosis not present

## 2016-08-26 DIAGNOSIS — Z23 Encounter for immunization: Secondary | ICD-10-CM | POA: Diagnosis not present

## 2016-08-31 DIAGNOSIS — Z23 Encounter for immunization: Secondary | ICD-10-CM | POA: Diagnosis not present

## 2016-08-31 DIAGNOSIS — D631 Anemia in chronic kidney disease: Secondary | ICD-10-CM | POA: Diagnosis not present

## 2016-08-31 DIAGNOSIS — N186 End stage renal disease: Secondary | ICD-10-CM | POA: Diagnosis not present

## 2016-08-31 DIAGNOSIS — N033 Chronic nephritic syndrome with diffuse mesangial proliferative glomerulonephritis: Secondary | ICD-10-CM | POA: Diagnosis not present

## 2016-08-31 DIAGNOSIS — D509 Iron deficiency anemia, unspecified: Secondary | ICD-10-CM | POA: Diagnosis not present

## 2016-08-31 DIAGNOSIS — Z992 Dependence on renal dialysis: Secondary | ICD-10-CM | POA: Diagnosis not present

## 2016-09-06 DIAGNOSIS — H6503 Acute serous otitis media, bilateral: Secondary | ICD-10-CM | POA: Diagnosis not present

## 2016-09-06 DIAGNOSIS — R040 Epistaxis: Secondary | ICD-10-CM | POA: Diagnosis not present

## 2016-09-10 DIAGNOSIS — D631 Anemia in chronic kidney disease: Secondary | ICD-10-CM | POA: Diagnosis not present

## 2016-09-10 DIAGNOSIS — J342 Deviated nasal septum: Secondary | ICD-10-CM | POA: Diagnosis not present

## 2016-09-10 DIAGNOSIS — R51 Headache: Secondary | ICD-10-CM | POA: Diagnosis not present

## 2016-09-10 DIAGNOSIS — R040 Epistaxis: Secondary | ICD-10-CM | POA: Diagnosis not present

## 2016-09-10 DIAGNOSIS — N186 End stage renal disease: Secondary | ICD-10-CM | POA: Diagnosis not present

## 2016-09-10 DIAGNOSIS — D509 Iron deficiency anemia, unspecified: Secondary | ICD-10-CM | POA: Diagnosis not present

## 2016-09-10 DIAGNOSIS — H0520 Unspecified exophthalmos: Secondary | ICD-10-CM | POA: Diagnosis not present

## 2016-09-13 DIAGNOSIS — D631 Anemia in chronic kidney disease: Secondary | ICD-10-CM | POA: Diagnosis not present

## 2016-09-13 DIAGNOSIS — D509 Iron deficiency anemia, unspecified: Secondary | ICD-10-CM | POA: Diagnosis not present

## 2016-09-13 DIAGNOSIS — N186 End stage renal disease: Secondary | ICD-10-CM | POA: Diagnosis not present

## 2016-09-14 DIAGNOSIS — F17210 Nicotine dependence, cigarettes, uncomplicated: Secondary | ICD-10-CM | POA: Diagnosis not present

## 2016-09-14 DIAGNOSIS — Z0000 Encounter for general adult medical examination without abnormal findings: Secondary | ICD-10-CM | POA: Diagnosis not present

## 2016-09-14 DIAGNOSIS — Z1239 Encounter for other screening for malignant neoplasm of breast: Secondary | ICD-10-CM | POA: Diagnosis not present

## 2016-09-14 DIAGNOSIS — J449 Chronic obstructive pulmonary disease, unspecified: Secondary | ICD-10-CM | POA: Diagnosis not present

## 2016-09-15 DIAGNOSIS — D509 Iron deficiency anemia, unspecified: Secondary | ICD-10-CM | POA: Diagnosis not present

## 2016-09-15 DIAGNOSIS — N186 End stage renal disease: Secondary | ICD-10-CM | POA: Diagnosis not present

## 2016-09-15 DIAGNOSIS — D631 Anemia in chronic kidney disease: Secondary | ICD-10-CM | POA: Diagnosis not present

## 2016-09-17 DIAGNOSIS — D631 Anemia in chronic kidney disease: Secondary | ICD-10-CM | POA: Diagnosis not present

## 2016-09-17 DIAGNOSIS — N186 End stage renal disease: Secondary | ICD-10-CM | POA: Diagnosis not present

## 2016-09-17 DIAGNOSIS — D509 Iron deficiency anemia, unspecified: Secondary | ICD-10-CM | POA: Diagnosis not present

## 2016-09-20 DIAGNOSIS — D631 Anemia in chronic kidney disease: Secondary | ICD-10-CM | POA: Diagnosis not present

## 2016-09-20 DIAGNOSIS — N186 End stage renal disease: Secondary | ICD-10-CM | POA: Diagnosis not present

## 2016-09-20 DIAGNOSIS — D509 Iron deficiency anemia, unspecified: Secondary | ICD-10-CM | POA: Diagnosis not present

## 2016-09-21 ENCOUNTER — Inpatient Hospital Stay (HOSPITAL_COMMUNITY): Admission: RE | Admit: 2016-09-21 | Payer: Medicare Other | Source: Ambulatory Visit

## 2016-09-22 ENCOUNTER — Ambulatory Visit: Payer: Medicare Other | Admitting: Vascular Surgery

## 2016-09-22 DIAGNOSIS — D631 Anemia in chronic kidney disease: Secondary | ICD-10-CM | POA: Diagnosis not present

## 2016-09-22 DIAGNOSIS — D509 Iron deficiency anemia, unspecified: Secondary | ICD-10-CM | POA: Diagnosis not present

## 2016-09-22 DIAGNOSIS — N186 End stage renal disease: Secondary | ICD-10-CM | POA: Diagnosis not present

## 2016-09-24 DIAGNOSIS — N186 End stage renal disease: Secondary | ICD-10-CM | POA: Diagnosis not present

## 2016-09-24 DIAGNOSIS — D631 Anemia in chronic kidney disease: Secondary | ICD-10-CM | POA: Diagnosis not present

## 2016-09-24 DIAGNOSIS — D509 Iron deficiency anemia, unspecified: Secondary | ICD-10-CM | POA: Diagnosis not present

## 2016-09-27 ENCOUNTER — Other Ambulatory Visit: Payer: Self-pay | Admitting: *Deleted

## 2016-09-27 DIAGNOSIS — N186 End stage renal disease: Secondary | ICD-10-CM | POA: Diagnosis not present

## 2016-09-27 DIAGNOSIS — D631 Anemia in chronic kidney disease: Secondary | ICD-10-CM | POA: Diagnosis not present

## 2016-09-27 DIAGNOSIS — D509 Iron deficiency anemia, unspecified: Secondary | ICD-10-CM | POA: Diagnosis not present

## 2016-09-28 ENCOUNTER — Encounter (HOSPITAL_COMMUNITY): Payer: Medicare Other

## 2016-09-29 ENCOUNTER — Encounter (HOSPITAL_COMMUNITY): Payer: Medicare Other

## 2016-09-29 DIAGNOSIS — D509 Iron deficiency anemia, unspecified: Secondary | ICD-10-CM | POA: Diagnosis not present

## 2016-09-29 DIAGNOSIS — D631 Anemia in chronic kidney disease: Secondary | ICD-10-CM | POA: Diagnosis not present

## 2016-09-29 DIAGNOSIS — N186 End stage renal disease: Secondary | ICD-10-CM | POA: Diagnosis not present

## 2016-09-30 DIAGNOSIS — N186 End stage renal disease: Secondary | ICD-10-CM | POA: Diagnosis not present

## 2016-09-30 DIAGNOSIS — Z992 Dependence on renal dialysis: Secondary | ICD-10-CM | POA: Diagnosis not present

## 2016-09-30 DIAGNOSIS — N033 Chronic nephritic syndrome with diffuse mesangial proliferative glomerulonephritis: Secondary | ICD-10-CM | POA: Diagnosis not present

## 2016-10-01 DIAGNOSIS — N186 End stage renal disease: Secondary | ICD-10-CM | POA: Diagnosis not present

## 2016-10-01 DIAGNOSIS — D509 Iron deficiency anemia, unspecified: Secondary | ICD-10-CM | POA: Diagnosis not present

## 2016-10-01 DIAGNOSIS — D631 Anemia in chronic kidney disease: Secondary | ICD-10-CM | POA: Diagnosis not present

## 2016-10-04 DIAGNOSIS — M5417 Radiculopathy, lumbosacral region: Secondary | ICD-10-CM | POA: Diagnosis not present

## 2016-10-04 DIAGNOSIS — D509 Iron deficiency anemia, unspecified: Secondary | ICD-10-CM | POA: Diagnosis not present

## 2016-10-04 DIAGNOSIS — D631 Anemia in chronic kidney disease: Secondary | ICD-10-CM | POA: Diagnosis not present

## 2016-10-04 DIAGNOSIS — M961 Postlaminectomy syndrome, not elsewhere classified: Secondary | ICD-10-CM | POA: Diagnosis not present

## 2016-10-04 DIAGNOSIS — M549 Dorsalgia, unspecified: Secondary | ICD-10-CM | POA: Diagnosis not present

## 2016-10-04 DIAGNOSIS — N186 End stage renal disease: Secondary | ICD-10-CM | POA: Diagnosis not present

## 2016-10-04 DIAGNOSIS — M791 Myalgia: Secondary | ICD-10-CM | POA: Diagnosis not present

## 2016-10-06 ENCOUNTER — Ambulatory Visit: Payer: Medicare Other | Admitting: Vascular Surgery

## 2016-10-06 DIAGNOSIS — D631 Anemia in chronic kidney disease: Secondary | ICD-10-CM | POA: Diagnosis not present

## 2016-10-06 DIAGNOSIS — N186 End stage renal disease: Secondary | ICD-10-CM | POA: Diagnosis not present

## 2016-10-06 DIAGNOSIS — D509 Iron deficiency anemia, unspecified: Secondary | ICD-10-CM | POA: Diagnosis not present

## 2016-10-08 DIAGNOSIS — D631 Anemia in chronic kidney disease: Secondary | ICD-10-CM | POA: Diagnosis not present

## 2016-10-08 DIAGNOSIS — D509 Iron deficiency anemia, unspecified: Secondary | ICD-10-CM | POA: Diagnosis not present

## 2016-10-08 DIAGNOSIS — N186 End stage renal disease: Secondary | ICD-10-CM | POA: Diagnosis not present

## 2016-10-11 DIAGNOSIS — D509 Iron deficiency anemia, unspecified: Secondary | ICD-10-CM | POA: Diagnosis not present

## 2016-10-11 DIAGNOSIS — N186 End stage renal disease: Secondary | ICD-10-CM | POA: Diagnosis not present

## 2016-10-11 DIAGNOSIS — D631 Anemia in chronic kidney disease: Secondary | ICD-10-CM | POA: Diagnosis not present

## 2016-10-13 ENCOUNTER — Ambulatory Visit: Payer: Medicare Other | Admitting: Vascular Surgery

## 2016-10-15 DIAGNOSIS — N186 End stage renal disease: Secondary | ICD-10-CM | POA: Diagnosis not present

## 2016-10-15 DIAGNOSIS — D509 Iron deficiency anemia, unspecified: Secondary | ICD-10-CM | POA: Diagnosis not present

## 2016-10-15 DIAGNOSIS — D631 Anemia in chronic kidney disease: Secondary | ICD-10-CM | POA: Diagnosis not present

## 2016-10-18 DIAGNOSIS — D509 Iron deficiency anemia, unspecified: Secondary | ICD-10-CM | POA: Diagnosis not present

## 2016-10-18 DIAGNOSIS — N186 End stage renal disease: Secondary | ICD-10-CM | POA: Diagnosis not present

## 2016-10-18 DIAGNOSIS — D631 Anemia in chronic kidney disease: Secondary | ICD-10-CM | POA: Diagnosis not present

## 2016-10-19 ENCOUNTER — Encounter (HOSPITAL_COMMUNITY): Payer: Medicare Other

## 2016-10-20 DIAGNOSIS — D509 Iron deficiency anemia, unspecified: Secondary | ICD-10-CM | POA: Diagnosis not present

## 2016-10-20 DIAGNOSIS — N186 End stage renal disease: Secondary | ICD-10-CM | POA: Diagnosis not present

## 2016-10-20 DIAGNOSIS — D631 Anemia in chronic kidney disease: Secondary | ICD-10-CM | POA: Diagnosis not present

## 2016-10-22 DIAGNOSIS — D631 Anemia in chronic kidney disease: Secondary | ICD-10-CM | POA: Diagnosis not present

## 2016-10-22 DIAGNOSIS — N186 End stage renal disease: Secondary | ICD-10-CM | POA: Diagnosis not present

## 2016-10-22 DIAGNOSIS — D509 Iron deficiency anemia, unspecified: Secondary | ICD-10-CM | POA: Diagnosis not present

## 2016-10-23 DIAGNOSIS — N186 End stage renal disease: Secondary | ICD-10-CM | POA: Diagnosis not present

## 2016-10-23 DIAGNOSIS — D631 Anemia in chronic kidney disease: Secondary | ICD-10-CM | POA: Diagnosis not present

## 2016-10-23 DIAGNOSIS — D509 Iron deficiency anemia, unspecified: Secondary | ICD-10-CM | POA: Diagnosis not present

## 2016-10-27 ENCOUNTER — Encounter (HOSPITAL_COMMUNITY): Payer: Medicare Other

## 2016-10-27 ENCOUNTER — Ambulatory Visit: Payer: Medicare Other | Admitting: Vascular Surgery

## 2016-10-29 DIAGNOSIS — N186 End stage renal disease: Secondary | ICD-10-CM | POA: Diagnosis not present

## 2016-10-29 DIAGNOSIS — D509 Iron deficiency anemia, unspecified: Secondary | ICD-10-CM | POA: Diagnosis not present

## 2016-10-29 DIAGNOSIS — D631 Anemia in chronic kidney disease: Secondary | ICD-10-CM | POA: Diagnosis not present

## 2016-10-30 DIAGNOSIS — D509 Iron deficiency anemia, unspecified: Secondary | ICD-10-CM | POA: Diagnosis not present

## 2016-10-30 DIAGNOSIS — D631 Anemia in chronic kidney disease: Secondary | ICD-10-CM | POA: Diagnosis not present

## 2016-10-30 DIAGNOSIS — N186 End stage renal disease: Secondary | ICD-10-CM | POA: Diagnosis not present

## 2016-10-31 DIAGNOSIS — N186 End stage renal disease: Secondary | ICD-10-CM | POA: Diagnosis not present

## 2016-10-31 DIAGNOSIS — Z992 Dependence on renal dialysis: Secondary | ICD-10-CM | POA: Diagnosis not present

## 2016-10-31 DIAGNOSIS — N033 Chronic nephritic syndrome with diffuse mesangial proliferative glomerulonephritis: Secondary | ICD-10-CM | POA: Diagnosis not present

## 2016-11-03 DIAGNOSIS — D509 Iron deficiency anemia, unspecified: Secondary | ICD-10-CM | POA: Diagnosis not present

## 2016-11-03 DIAGNOSIS — N2581 Secondary hyperparathyroidism of renal origin: Secondary | ICD-10-CM | POA: Diagnosis not present

## 2016-11-03 DIAGNOSIS — D631 Anemia in chronic kidney disease: Secondary | ICD-10-CM | POA: Diagnosis not present

## 2016-11-03 DIAGNOSIS — N186 End stage renal disease: Secondary | ICD-10-CM | POA: Diagnosis not present

## 2016-11-05 DIAGNOSIS — D509 Iron deficiency anemia, unspecified: Secondary | ICD-10-CM | POA: Diagnosis not present

## 2016-11-05 DIAGNOSIS — D631 Anemia in chronic kidney disease: Secondary | ICD-10-CM | POA: Diagnosis not present

## 2016-11-05 DIAGNOSIS — N186 End stage renal disease: Secondary | ICD-10-CM | POA: Diagnosis not present

## 2016-11-05 DIAGNOSIS — N2581 Secondary hyperparathyroidism of renal origin: Secondary | ICD-10-CM | POA: Diagnosis not present

## 2016-11-08 DIAGNOSIS — N186 End stage renal disease: Secondary | ICD-10-CM | POA: Diagnosis not present

## 2016-11-08 DIAGNOSIS — D631 Anemia in chronic kidney disease: Secondary | ICD-10-CM | POA: Diagnosis not present

## 2016-11-08 DIAGNOSIS — D509 Iron deficiency anemia, unspecified: Secondary | ICD-10-CM | POA: Diagnosis not present

## 2016-11-08 DIAGNOSIS — N2581 Secondary hyperparathyroidism of renal origin: Secondary | ICD-10-CM | POA: Diagnosis not present

## 2016-11-10 DIAGNOSIS — D509 Iron deficiency anemia, unspecified: Secondary | ICD-10-CM | POA: Diagnosis not present

## 2016-11-10 DIAGNOSIS — D631 Anemia in chronic kidney disease: Secondary | ICD-10-CM | POA: Diagnosis not present

## 2016-11-10 DIAGNOSIS — N2581 Secondary hyperparathyroidism of renal origin: Secondary | ICD-10-CM | POA: Diagnosis not present

## 2016-11-10 DIAGNOSIS — N186 End stage renal disease: Secondary | ICD-10-CM | POA: Diagnosis not present

## 2016-11-11 DIAGNOSIS — F17210 Nicotine dependence, cigarettes, uncomplicated: Secondary | ICD-10-CM | POA: Diagnosis not present

## 2016-11-11 DIAGNOSIS — G43009 Migraine without aura, not intractable, without status migrainosus: Secondary | ICD-10-CM | POA: Diagnosis not present

## 2016-11-11 DIAGNOSIS — I10 Essential (primary) hypertension: Secondary | ICD-10-CM | POA: Diagnosis not present

## 2016-11-11 DIAGNOSIS — J449 Chronic obstructive pulmonary disease, unspecified: Secondary | ICD-10-CM | POA: Diagnosis not present

## 2016-11-11 DIAGNOSIS — F329 Major depressive disorder, single episode, unspecified: Secondary | ICD-10-CM | POA: Diagnosis not present

## 2016-11-12 DIAGNOSIS — D631 Anemia in chronic kidney disease: Secondary | ICD-10-CM | POA: Diagnosis not present

## 2016-11-12 DIAGNOSIS — N186 End stage renal disease: Secondary | ICD-10-CM | POA: Diagnosis not present

## 2016-11-12 DIAGNOSIS — D509 Iron deficiency anemia, unspecified: Secondary | ICD-10-CM | POA: Diagnosis not present

## 2016-11-12 DIAGNOSIS — N2581 Secondary hyperparathyroidism of renal origin: Secondary | ICD-10-CM | POA: Diagnosis not present

## 2016-11-15 DIAGNOSIS — D509 Iron deficiency anemia, unspecified: Secondary | ICD-10-CM | POA: Diagnosis not present

## 2016-11-15 DIAGNOSIS — N186 End stage renal disease: Secondary | ICD-10-CM | POA: Diagnosis not present

## 2016-11-15 DIAGNOSIS — D631 Anemia in chronic kidney disease: Secondary | ICD-10-CM | POA: Diagnosis not present

## 2016-11-15 DIAGNOSIS — N2581 Secondary hyperparathyroidism of renal origin: Secondary | ICD-10-CM | POA: Diagnosis not present

## 2016-11-22 DIAGNOSIS — D509 Iron deficiency anemia, unspecified: Secondary | ICD-10-CM | POA: Diagnosis not present

## 2016-11-22 DIAGNOSIS — N2581 Secondary hyperparathyroidism of renal origin: Secondary | ICD-10-CM | POA: Diagnosis not present

## 2016-11-22 DIAGNOSIS — N186 End stage renal disease: Secondary | ICD-10-CM | POA: Diagnosis not present

## 2016-11-22 DIAGNOSIS — D631 Anemia in chronic kidney disease: Secondary | ICD-10-CM | POA: Diagnosis not present

## 2016-11-24 DIAGNOSIS — N2581 Secondary hyperparathyroidism of renal origin: Secondary | ICD-10-CM | POA: Diagnosis not present

## 2016-11-24 DIAGNOSIS — D509 Iron deficiency anemia, unspecified: Secondary | ICD-10-CM | POA: Diagnosis not present

## 2016-11-24 DIAGNOSIS — E119 Type 2 diabetes mellitus without complications: Secondary | ICD-10-CM | POA: Diagnosis not present

## 2016-11-24 DIAGNOSIS — N186 End stage renal disease: Secondary | ICD-10-CM | POA: Diagnosis not present

## 2016-11-24 DIAGNOSIS — D631 Anemia in chronic kidney disease: Secondary | ICD-10-CM | POA: Diagnosis not present

## 2016-11-26 DIAGNOSIS — D509 Iron deficiency anemia, unspecified: Secondary | ICD-10-CM | POA: Diagnosis not present

## 2016-11-26 DIAGNOSIS — N186 End stage renal disease: Secondary | ICD-10-CM | POA: Diagnosis not present

## 2016-11-26 DIAGNOSIS — D631 Anemia in chronic kidney disease: Secondary | ICD-10-CM | POA: Diagnosis not present

## 2016-11-26 DIAGNOSIS — N2581 Secondary hyperparathyroidism of renal origin: Secondary | ICD-10-CM | POA: Diagnosis not present

## 2016-11-29 DIAGNOSIS — N186 End stage renal disease: Secondary | ICD-10-CM | POA: Diagnosis not present

## 2016-11-29 DIAGNOSIS — N2581 Secondary hyperparathyroidism of renal origin: Secondary | ICD-10-CM | POA: Diagnosis not present

## 2016-11-29 DIAGNOSIS — D509 Iron deficiency anemia, unspecified: Secondary | ICD-10-CM | POA: Diagnosis not present

## 2016-11-29 DIAGNOSIS — D631 Anemia in chronic kidney disease: Secondary | ICD-10-CM | POA: Diagnosis not present

## 2016-12-01 DIAGNOSIS — N186 End stage renal disease: Secondary | ICD-10-CM | POA: Diagnosis not present

## 2016-12-01 DIAGNOSIS — N2581 Secondary hyperparathyroidism of renal origin: Secondary | ICD-10-CM | POA: Diagnosis not present

## 2016-12-01 DIAGNOSIS — Z992 Dependence on renal dialysis: Secondary | ICD-10-CM | POA: Diagnosis not present

## 2016-12-01 DIAGNOSIS — D631 Anemia in chronic kidney disease: Secondary | ICD-10-CM | POA: Diagnosis not present

## 2016-12-01 DIAGNOSIS — D509 Iron deficiency anemia, unspecified: Secondary | ICD-10-CM | POA: Diagnosis not present

## 2016-12-01 DIAGNOSIS — N033 Chronic nephritic syndrome with diffuse mesangial proliferative glomerulonephritis: Secondary | ICD-10-CM | POA: Diagnosis not present

## 2016-12-02 DIAGNOSIS — N186 End stage renal disease: Secondary | ICD-10-CM | POA: Diagnosis not present

## 2016-12-02 DIAGNOSIS — G5702 Lesion of sciatic nerve, left lower limb: Secondary | ICD-10-CM | POA: Diagnosis not present

## 2016-12-02 DIAGNOSIS — M5417 Radiculopathy, lumbosacral region: Secondary | ICD-10-CM | POA: Diagnosis not present

## 2016-12-02 DIAGNOSIS — Z79899 Other long term (current) drug therapy: Secondary | ICD-10-CM | POA: Diagnosis not present

## 2016-12-02 DIAGNOSIS — Z5181 Encounter for therapeutic drug level monitoring: Secondary | ICD-10-CM | POA: Diagnosis not present

## 2016-12-02 DIAGNOSIS — Z79891 Long term (current) use of opiate analgesic: Secondary | ICD-10-CM | POA: Diagnosis not present

## 2016-12-02 DIAGNOSIS — M791 Myalgia: Secondary | ICD-10-CM | POA: Diagnosis not present

## 2016-12-03 DIAGNOSIS — N186 End stage renal disease: Secondary | ICD-10-CM | POA: Diagnosis not present

## 2016-12-03 DIAGNOSIS — N2581 Secondary hyperparathyroidism of renal origin: Secondary | ICD-10-CM | POA: Diagnosis not present

## 2016-12-06 DIAGNOSIS — N186 End stage renal disease: Secondary | ICD-10-CM | POA: Diagnosis not present

## 2016-12-06 DIAGNOSIS — N2581 Secondary hyperparathyroidism of renal origin: Secondary | ICD-10-CM | POA: Diagnosis not present

## 2016-12-08 DIAGNOSIS — N186 End stage renal disease: Secondary | ICD-10-CM | POA: Diagnosis not present

## 2016-12-08 DIAGNOSIS — N2581 Secondary hyperparathyroidism of renal origin: Secondary | ICD-10-CM | POA: Diagnosis not present

## 2016-12-10 DIAGNOSIS — N186 End stage renal disease: Secondary | ICD-10-CM | POA: Diagnosis not present

## 2016-12-10 DIAGNOSIS — N2581 Secondary hyperparathyroidism of renal origin: Secondary | ICD-10-CM | POA: Diagnosis not present

## 2016-12-15 DIAGNOSIS — N2581 Secondary hyperparathyroidism of renal origin: Secondary | ICD-10-CM | POA: Diagnosis not present

## 2016-12-15 DIAGNOSIS — N186 End stage renal disease: Secondary | ICD-10-CM | POA: Diagnosis not present

## 2016-12-16 DIAGNOSIS — I120 Hypertensive chronic kidney disease with stage 5 chronic kidney disease or end stage renal disease: Secondary | ICD-10-CM | POA: Diagnosis not present

## 2016-12-16 DIAGNOSIS — G894 Chronic pain syndrome: Secondary | ICD-10-CM | POA: Diagnosis not present

## 2016-12-16 DIAGNOSIS — N186 End stage renal disease: Secondary | ICD-10-CM | POA: Diagnosis not present

## 2016-12-16 DIAGNOSIS — D631 Anemia in chronic kidney disease: Secondary | ICD-10-CM | POA: Diagnosis not present

## 2016-12-16 DIAGNOSIS — Z87891 Personal history of nicotine dependence: Secondary | ICD-10-CM | POA: Diagnosis not present

## 2016-12-16 DIAGNOSIS — M5417 Radiculopathy, lumbosacral region: Secondary | ICD-10-CM | POA: Diagnosis not present

## 2016-12-16 DIAGNOSIS — Z888 Allergy status to other drugs, medicaments and biological substances status: Secondary | ICD-10-CM | POA: Diagnosis not present

## 2016-12-16 DIAGNOSIS — F419 Anxiety disorder, unspecified: Secondary | ICD-10-CM | POA: Diagnosis not present

## 2016-12-16 DIAGNOSIS — G4733 Obstructive sleep apnea (adult) (pediatric): Secondary | ICD-10-CM | POA: Diagnosis not present

## 2016-12-16 DIAGNOSIS — Z992 Dependence on renal dialysis: Secondary | ICD-10-CM | POA: Diagnosis not present

## 2016-12-16 DIAGNOSIS — F329 Major depressive disorder, single episode, unspecified: Secondary | ICD-10-CM | POA: Diagnosis not present

## 2016-12-17 DIAGNOSIS — N2581 Secondary hyperparathyroidism of renal origin: Secondary | ICD-10-CM | POA: Diagnosis not present

## 2016-12-17 DIAGNOSIS — N186 End stage renal disease: Secondary | ICD-10-CM | POA: Diagnosis not present

## 2016-12-22 ENCOUNTER — Encounter: Payer: Self-pay | Admitting: Vascular Surgery

## 2016-12-22 DIAGNOSIS — N2581 Secondary hyperparathyroidism of renal origin: Secondary | ICD-10-CM | POA: Diagnosis not present

## 2016-12-22 DIAGNOSIS — N186 End stage renal disease: Secondary | ICD-10-CM | POA: Diagnosis not present

## 2016-12-24 DIAGNOSIS — N2581 Secondary hyperparathyroidism of renal origin: Secondary | ICD-10-CM | POA: Diagnosis not present

## 2016-12-24 DIAGNOSIS — N186 End stage renal disease: Secondary | ICD-10-CM | POA: Diagnosis not present

## 2016-12-27 DIAGNOSIS — N186 End stage renal disease: Secondary | ICD-10-CM | POA: Diagnosis not present

## 2016-12-27 DIAGNOSIS — N2581 Secondary hyperparathyroidism of renal origin: Secondary | ICD-10-CM | POA: Diagnosis not present

## 2016-12-28 DIAGNOSIS — N2581 Secondary hyperparathyroidism of renal origin: Secondary | ICD-10-CM | POA: Diagnosis not present

## 2016-12-28 DIAGNOSIS — N186 End stage renal disease: Secondary | ICD-10-CM | POA: Diagnosis not present

## 2016-12-29 ENCOUNTER — Ambulatory Visit (INDEPENDENT_AMBULATORY_CARE_PROVIDER_SITE_OTHER): Payer: Medicare Other | Admitting: Vascular Surgery

## 2016-12-29 ENCOUNTER — Encounter: Payer: Self-pay | Admitting: Vascular Surgery

## 2016-12-29 ENCOUNTER — Other Ambulatory Visit: Payer: Self-pay

## 2016-12-29 ENCOUNTER — Ambulatory Visit (HOSPITAL_COMMUNITY)
Admission: RE | Admit: 2016-12-29 | Discharge: 2016-12-29 | Disposition: A | Discharge status: Home / Self Care | Payer: Medicare Other | Source: Ambulatory Visit | Attending: Vascular Surgery | Admitting: Vascular Surgery

## 2016-12-29 VITALS — BP 132/85 | HR 69 | Temp 97.9°F | Resp 18 | Ht 64.0 in | Wt 155.8 lb

## 2016-12-29 DIAGNOSIS — Z992 Dependence on renal dialysis: Secondary | ICD-10-CM

## 2016-12-29 DIAGNOSIS — N186 End stage renal disease: Secondary | ICD-10-CM

## 2016-12-29 DIAGNOSIS — N033 Chronic nephritic syndrome with diffuse mesangial proliferative glomerulonephritis: Secondary | ICD-10-CM | POA: Diagnosis not present

## 2016-12-29 NOTE — Progress Notes (Signed)
Patient name: [name] MRN: [id number] DOB: 10-12-1971 Sex: female  REASON FOR VISIT: Aneurysm and pain of left upper arm AV fistula. Referred by Juanell Fairly, NP.  HPI: [name] is a 46 y.o. female who had a left brachiocephalic fistula placed proximally 5 years ago. This has been functioning well however, she's had pain associated with her fistula. She was sent for evaluation.  She dialyzes on Monday Wednesdays and Fridays. She denies any recent uremic symptoms. Specifically, she denies nausea, vomiting, fatigue, anorexia, or palpitations. She has lost a lot of weight and is trying to get back into exercise.  Current Outpatient Prescriptions  Medication Sig Dispense Refill  . albuterol (PROVENTIL HFA;VENTOLIN HFA) 108 (90 BASE) MCG/ACT inhaler Inhale 2 puffs into the lungs every 6 (six) hours as needed. For shortness of breath     . allopurinol (ZYLOPRIM) 300 MG tablet Take 300 mg by mouth at bedtime.     . ALPRAZolam (XANAX) 0.5 MG tablet Take 0.5 mg by mouth 3 (three) times daily.     Marland Kitchen amLODipine (NORVASC) 10 MG tablet Take 10 mg by mouth daily.    . budesonide-formoterol (SYMBICORT) 160-4.5 MCG/ACT inhaler Inhale 2 puffs into the lungs 2 (two) times daily.    . cyclobenzaprine (FLEXERIL) 10 MG tablet Take 10 mg by mouth 2 (two) times daily as needed for muscle spasms. For muscle spasms    . divalproex (DEPAKOTE) 500 MG DR tablet Take 1 tablet (500 mg total) by mouth 3 (three) times daily. 90 tablet 0  . DULoxetine (CYMBALTA) 60 MG capsule Take 60 mg by mouth daily.    Marland Kitchen esomeprazole (NEXIUM) 40 MG capsule Take 40 mg by mouth daily.     . furosemide (LASIX) 80 MG tablet Take 80 mg by mouth 2 (two) times daily.     Marland Kitchen HYDROmorphone (DILAUDID) 2 MG tablet Take 2 mg by mouth every 12 (twelve) hours as needed for severe pain.    Marland Kitchen lidocaine-prilocaine (EMLA) cream Apply 1 application topically 3 (three) times a week. For dialysis port.    Marland Kitchen lisinopril  (PRINIVIL,ZESTRIL) 40 MG tablet Take 40 mg by mouth daily.    . Melatonin 5 MG TABS Take by mouth.    . methocarbamol (ROBAXIN) 500 MG tablet Take 500 mg by mouth 2 (two) times daily.     . montelukast (SINGULAIR) 10 MG tablet Take 10 mg by mouth every morning.     . multivitamin (RENA-VIT) TABS tablet Take 1 tablet by mouth daily.    Marland Kitchen oxyCODONE (OXYCONTIN) 40 mg 12 hr tablet Take 40 mg by mouth every 12 (twelve) hours.    . promethazine (PHENERGAN) 25 MG tablet Take 25 mg by mouth every 6 (six) hours as needed. For nausea    . saccharomyces boulardii (FLORASTOR) 250 MG capsule Take 1 capsule (250 mg total) by mouth 2 (two) times daily. 30 capsule 0  . sevelamer carbonate (RENVELA) 800 MG tablet Take 800 mg by mouth 3 (three) times daily with meals.    . SUMAtriptan (IMITREX) 50 MG tablet Take 50 mg by mouth daily as needed. For migraines    . ciprofloxacin (CIPRO) 500 MG tablet Take 1 tablet (500 mg total) by mouth daily. On dialysis day, and take after your dialysis is completed. (Patient not taking: Reported on 12/29/2016) 10 tablet 0  . HYDROcodone-acetaminophen (NORCO) 10-325 MG per tablet Take 1 tablet by mouth every 6 (six) hours as needed for moderate pain.    Marland Kitchen  metroNIDAZOLE (FLAGYL) 500 MG tablet Take 1 tablet (500 mg total) by mouth 2 (two) times daily. (Patient not taking: Reported on 12/29/2016) 20 tablet 0  . oxymorphone (OPANA ER) 30 MG 12 hr tablet Take 30 mg by mouth every 12 (twelve) hours.    . prochlorperazine (COMPAZINE) 5 MG tablet Take 5 mg by mouth daily as needed for nausea.      No current facility-administered medications for this visit.     REVIEW OF SYSTEMS:  [X]  denotes positive finding, [ ]  denotes negative finding Cardiac  Comments:  Chest pain or chest pressure:    Shortness of breath upon exertion: X   Short of breath when lying flat:    Irregular heart rhythm:    Constitutional    Fever or chills:      PHYSICAL EXAM: Vitals:   12/29/16 1236  BP:  132/85  Pulse: 69  Resp: 18  Temp: 97.9 F (36.6 C)  TempSrc: Oral  SpO2: 97%  Weight: 155 lb 12.8 oz (70.7 kg)  Height: 5\' 4"  (1.626 m)    GENERAL: The patient is a well-nourished female, in no acute distress. The vital signs are documented above. CARDIOVASCULAR: There is a regular rate and rhythm. PULMONARY: There is good air exchange bilaterally without wheezing or rales. She has a markedly aneurysmal and tortuous left brachiocephalic fistula. Based on her duplex scan there is some narrowing of the cephalic vein centrally. She has a palpable left radial pulse.  MEDICAL ISSUES:  ANEURYSMAL LEFT UPPER ARM FISTULA: This patient has a large aneurysmal tortuous left upper arm fistula which she has had for many years. The aneurysms are large enough that I explained that certainly it would be an option to place new access in the right arm and continued use the fistula into the new access was ready and then ligate this. However, she feels strongly about trying to preserve the fistula. This will be challenging but is certainly possible. I would recommend doing this in 2 stages. I will start by trying to mobilize the central portion of the fistula and reimplanting this into the brachial vein in the axilla to address the upper aneurysmal tortuous segment. Once this the segment can be cannulated we could then try to address the proximal aneurysm which is very large. We could try to plicate this, posterior wall to allow for continued cannulation the segment or its possible that we would have to place an interposition graft. However, she would like to try to preserve this fistula if at all possible. We will schedule this on a nondialysis day and this has been scheduled for March 6.  Deitra Mayo [facility] [phone]

## 2016-12-31 DIAGNOSIS — N186 End stage renal disease: Secondary | ICD-10-CM | POA: Diagnosis not present

## 2016-12-31 DIAGNOSIS — D631 Anemia in chronic kidney disease: Secondary | ICD-10-CM | POA: Diagnosis not present

## 2016-12-31 DIAGNOSIS — N2581 Secondary hyperparathyroidism of renal origin: Secondary | ICD-10-CM | POA: Diagnosis not present

## 2017-01-03 ENCOUNTER — Encounter (HOSPITAL_COMMUNITY): Payer: Self-pay | Admitting: *Deleted

## 2017-01-03 DIAGNOSIS — N2581 Secondary hyperparathyroidism of renal origin: Secondary | ICD-10-CM | POA: Diagnosis not present

## 2017-01-03 DIAGNOSIS — D631 Anemia in chronic kidney disease: Secondary | ICD-10-CM | POA: Diagnosis not present

## 2017-01-03 DIAGNOSIS — N186 End stage renal disease: Secondary | ICD-10-CM | POA: Diagnosis not present

## 2017-01-03 NOTE — Anesthesia Preprocedure Evaluation (Addendum)
Anesthesia Evaluation  Patient identified by MRN, date of birth, ID band Patient awake    Reviewed: Allergy & Precautions, H&P , NPO status , Patient's Chart, lab work & pertinent test results  Airway Mallampati: II  TM Distance: >3 FB Neck ROM: Full    Dental no notable dental hx. (+) Teeth Intact, Dental Advisory Given   Pulmonary asthma , Current Smoker,    Pulmonary exam normal breath sounds clear to auscultation       Cardiovascular Exercise Tolerance: Good hypertension, Pt. on medications  Rhythm:Regular Rate:Normal     Neuro/Psych  Headaches, Anxiety Depression negative psych ROS   GI/Hepatic Neg liver ROS, GERD  Medicated and Controlled,  Endo/Other  negative endocrine ROS  Renal/GU ESRF and DialysisRenal disease  negative genitourinary   Musculoskeletal  (+) Arthritis , Osteoarthritis,    Abdominal   Peds  (+) ATTENTION DEFICIT DISORDER WITHOUT HYPERACTIVITY Hematology negative hematology ROS (+)   Anesthesia Other Findings   Reproductive/Obstetrics negative OB ROS                          Anesthesia Physical Anesthesia Plan  ASA: III  Anesthesia Plan: General   Post-op Pain Management:    Induction: Intravenous  Airway Management Planned: LMA  Additional Equipment:   Intra-op Plan:   Post-operative Plan: Extubation in OR  Informed Consent: I have reviewed the patients History and Physical, chart, labs and discussed the procedure including the risks, benefits and alternatives for the proposed anesthesia with the patient or authorized representative who has indicated his/her understanding and acceptance.   Dental advisory given  Plan Discussed with: CRNA  Anesthesia Plan Comments:         Anesthesia Quick Evaluation

## 2017-01-04 ENCOUNTER — Telehealth: Payer: Self-pay | Admitting: Vascular Surgery

## 2017-01-04 ENCOUNTER — Ambulatory Visit (HOSPITAL_COMMUNITY): Payer: Medicare Other | Admitting: Anesthesiology

## 2017-01-04 ENCOUNTER — Encounter (HOSPITAL_COMMUNITY)
Admission: RE | Disposition: A | Discharge status: Home / Self Care | Payer: Self-pay | Source: Ambulatory Visit | Attending: Vascular Surgery

## 2017-01-04 ENCOUNTER — Ambulatory Visit (HOSPITAL_COMMUNITY)
Admission: RE | Admit: 2017-01-04 | Discharge: 2017-01-04 | Disposition: A | Discharge status: Home / Self Care | Payer: Medicare Other | Source: Ambulatory Visit | Attending: Vascular Surgery | Admitting: Vascular Surgery

## 2017-01-04 ENCOUNTER — Encounter (HOSPITAL_COMMUNITY): Payer: Self-pay | Admitting: *Deleted

## 2017-01-04 DIAGNOSIS — N185 Chronic kidney disease, stage 5: Secondary | ICD-10-CM | POA: Diagnosis not present

## 2017-01-04 DIAGNOSIS — Y832 Surgical operation with anastomosis, bypass or graft as the cause of abnormal reaction of the patient, or of later complication, without mention of misadventure at the time of the procedure: Secondary | ICD-10-CM | POA: Diagnosis not present

## 2017-01-04 DIAGNOSIS — Z992 Dependence on renal dialysis: Secondary | ICD-10-CM | POA: Insufficient documentation

## 2017-01-04 DIAGNOSIS — F17200 Nicotine dependence, unspecified, uncomplicated: Secondary | ICD-10-CM | POA: Insufficient documentation

## 2017-01-04 DIAGNOSIS — K219 Gastro-esophageal reflux disease without esophagitis: Secondary | ICD-10-CM | POA: Diagnosis not present

## 2017-01-04 DIAGNOSIS — I120 Hypertensive chronic kidney disease with stage 5 chronic kidney disease or end stage renal disease: Secondary | ICD-10-CM | POA: Insufficient documentation

## 2017-01-04 DIAGNOSIS — D649 Anemia, unspecified: Secondary | ICD-10-CM | POA: Diagnosis not present

## 2017-01-04 DIAGNOSIS — M1990 Unspecified osteoarthritis, unspecified site: Secondary | ICD-10-CM | POA: Diagnosis not present

## 2017-01-04 DIAGNOSIS — N186 End stage renal disease: Secondary | ICD-10-CM | POA: Insufficient documentation

## 2017-01-04 DIAGNOSIS — T82848A Pain from vascular prosthetic devices, implants and grafts, initial encounter: Secondary | ICD-10-CM | POA: Diagnosis not present

## 2017-01-04 DIAGNOSIS — G43909 Migraine, unspecified, not intractable, without status migrainosus: Secondary | ICD-10-CM | POA: Diagnosis not present

## 2017-01-04 DIAGNOSIS — T82898A Other specified complication of vascular prosthetic devices, implants and grafts, initial encounter: Secondary | ICD-10-CM | POA: Insufficient documentation

## 2017-01-04 DIAGNOSIS — Z7951 Long term (current) use of inhaled steroids: Secondary | ICD-10-CM | POA: Insufficient documentation

## 2017-01-04 DIAGNOSIS — J45909 Unspecified asthma, uncomplicated: Secondary | ICD-10-CM | POA: Diagnosis not present

## 2017-01-04 DIAGNOSIS — I721 Aneurysm of artery of upper extremity: Secondary | ICD-10-CM | POA: Diagnosis not present

## 2017-01-04 HISTORY — PX: REVISON OF ARTERIOVENOUS FISTULA: SHX6074

## 2017-01-04 HISTORY — DX: End stage renal disease: N18.6

## 2017-01-04 LAB — POCT I-STAT 4, (NA,K, GLUC, HGB,HCT)
GLUCOSE: 98 mg/dL (ref 65–99)
HEMATOCRIT: 33 % — AB (ref 36.0–46.0)
Hemoglobin: 11.2 g/dL — ABNORMAL LOW (ref 12.0–15.0)
Potassium: 4 mmol/L (ref 3.5–5.1)
Sodium: 140 mmol/L (ref 135–145)

## 2017-01-04 SURGERY — REVISON OF ARTERIOVENOUS FISTULA
Anesthesia: General | Laterality: Left

## 2017-01-04 MED ORDER — HYDROMORPHONE HCL 1 MG/ML IJ SOLN
0.2500 mg | INTRAMUSCULAR | Status: DC | PRN
  Administered 2017-01-04 (×3): 0.5 mg via INTRAVENOUS

## 2017-01-04 MED ORDER — LIDOCAINE-EPINEPHRINE (PF) 1 %-1:200000 IJ SOLN
INTRAMUSCULAR | Status: AC
  Filled 2017-01-04: qty 30

## 2017-01-04 MED ORDER — SODIUM CHLORIDE 0.9 % IV SOLN
INTRAVENOUS | Status: DC | PRN
  Administered 2017-01-04: 07:00:00 via INTRAVENOUS

## 2017-01-04 MED ORDER — SODIUM CHLORIDE 0.9 % IR SOLN
Status: DC | PRN
  Administered 2017-01-04: 1000 mL

## 2017-01-04 MED ORDER — HEPARIN SODIUM (PORCINE) 1000 UNIT/ML IJ SOLN
INTRAMUSCULAR | Status: AC
  Filled 2017-01-04: qty 1

## 2017-01-04 MED ORDER — LIDOCAINE HCL (PF) 1 % IJ SOLN
INTRAMUSCULAR | Status: AC
  Filled 2017-01-04: qty 30

## 2017-01-04 MED ORDER — FENTANYL CITRATE (PF) 100 MCG/2ML IJ SOLN
INTRAMUSCULAR | Status: DC | PRN
  Administered 2017-01-04: 100 ug via INTRAVENOUS
  Administered 2017-01-04 (×3): 50 ug via INTRAVENOUS

## 2017-01-04 MED ORDER — HYDROMORPHONE HCL 1 MG/ML IJ SOLN
INTRAMUSCULAR | Status: AC
  Administered 2017-01-04: 0.5 mg via INTRAVENOUS
  Filled 2017-01-04: qty 1

## 2017-01-04 MED ORDER — HEPARIN SODIUM (PORCINE) 1000 UNIT/ML IJ SOLN
INTRAMUSCULAR | Status: DC | PRN
  Administered 2017-01-04: 6000 [IU] via INTRAVENOUS

## 2017-01-04 MED ORDER — SUCCINYLCHOLINE CHLORIDE 200 MG/10ML IV SOSY
PREFILLED_SYRINGE | INTRAVENOUS | Status: AC
  Filled 2017-01-04: qty 10

## 2017-01-04 MED ORDER — SODIUM CHLORIDE 0.9 % IV SOLN: INTRAVENOUS | Status: DC

## 2017-01-04 MED ORDER — LIDOCAINE 2% (20 MG/ML) 5 ML SYRINGE
INTRAMUSCULAR | Status: AC
  Filled 2017-01-04: qty 5

## 2017-01-04 MED ORDER — DEXTROSE 5 % IV SOLN
1.5000 g | INTRAVENOUS | Status: AC
  Administered 2017-01-04: 1.5 g via INTRAVENOUS
  Filled 2017-01-04: qty 1.5

## 2017-01-04 MED ORDER — CHLORHEXIDINE GLUCONATE CLOTH 2 % EX PADS: 6.0000 | MEDICATED_PAD | Freq: Once | CUTANEOUS | Status: DC

## 2017-01-04 MED ORDER — PROPOFOL 10 MG/ML IV BOLUS
INTRAVENOUS | Status: DC | PRN
  Administered 2017-01-04: 50 mg via INTRAVENOUS
  Administered 2017-01-04: 150 mg via INTRAVENOUS

## 2017-01-04 MED ORDER — SODIUM CHLORIDE 0.9 % IV SOLN
INTRAVENOUS | Status: DC | PRN
  Administered 2017-01-04: 500 mL

## 2017-01-04 MED ORDER — MIDAZOLAM HCL 2 MG/2ML IJ SOLN
INTRAMUSCULAR | Status: AC
  Filled 2017-01-04: qty 2

## 2017-01-04 MED ORDER — PHENYLEPHRINE HCL 10 MG/ML IJ SOLN
INTRAVENOUS | Status: DC | PRN
  Administered 2017-01-04: 20 ug/min via INTRAVENOUS

## 2017-01-04 MED ORDER — EPHEDRINE 5 MG/ML INJ
INTRAVENOUS | Status: AC
  Filled 2017-01-04: qty 10

## 2017-01-04 MED ORDER — LIDOCAINE HCL (PF) 1 % IJ SOLN
INTRAMUSCULAR | Status: DC | PRN
  Administered 2017-01-04: 16 mL

## 2017-01-04 MED ORDER — FENTANYL CITRATE (PF) 250 MCG/5ML IJ SOLN
INTRAMUSCULAR | Status: AC
  Filled 2017-01-04: qty 5

## 2017-01-04 MED ORDER — PROPOFOL 10 MG/ML IV BOLUS
INTRAVENOUS | Status: AC
  Filled 2017-01-04: qty 20

## 2017-01-04 MED ORDER — PHENYLEPHRINE 40 MCG/ML (10ML) SYRINGE FOR IV PUSH (FOR BLOOD PRESSURE SUPPORT)
PREFILLED_SYRINGE | INTRAVENOUS | Status: AC
  Filled 2017-01-04: qty 10

## 2017-01-04 MED ORDER — PAPAVERINE HCL 30 MG/ML IJ SOLN
INTRAMUSCULAR | Status: AC
  Filled 2017-01-04: qty 2

## 2017-01-04 MED ORDER — ONDANSETRON HCL 4 MG/2ML IJ SOLN
INTRAMUSCULAR | Status: DC | PRN
  Administered 2017-01-04: 4 mg via INTRAVENOUS

## 2017-01-04 MED ORDER — OXYCODONE-ACETAMINOPHEN 5-325 MG PO TABS: 1.0000 | ORAL_TABLET | Freq: Four times a day (QID) | ORAL | 0 refills | Status: DC | PRN

## 2017-01-04 MED ORDER — LIDOCAINE HCL (CARDIAC) 20 MG/ML IV SOLN
INTRAVENOUS | Status: DC | PRN
  Administered 2017-01-04: 40 mg via INTRAVENOUS
  Administered 2017-01-04: 60 mg via INTRAVENOUS

## 2017-01-04 MED ORDER — PROPOFOL 10 MG/ML IV BOLUS
INTRAVENOUS | Status: AC
  Filled 2017-01-04: qty 40

## 2017-01-04 MED ORDER — THROMBIN 20000 UNITS EX SOLR
CUTANEOUS | Status: AC
  Filled 2017-01-04: qty 20000

## 2017-01-04 MED ORDER — ONDANSETRON HCL 4 MG/2ML IJ SOLN
INTRAMUSCULAR | Status: AC
  Filled 2017-01-04: qty 2

## 2017-01-04 MED ORDER — MIDAZOLAM HCL 5 MG/5ML IJ SOLN
INTRAMUSCULAR | Status: DC | PRN
  Administered 2017-01-04: 2 mg via INTRAVENOUS

## 2017-01-04 MED ORDER — HYDROMORPHONE HCL 1 MG/ML IJ SOLN
INTRAMUSCULAR | Status: AC
  Administered 2017-01-04: 0.5 mg via INTRAVENOUS
  Filled 2017-01-04: qty 0.5

## 2017-01-04 MED ORDER — PROTAMINE SULFATE 10 MG/ML IV SOLN
INTRAVENOUS | Status: DC | PRN
  Administered 2017-01-04: 30 mg via INTRAVENOUS

## 2017-01-04 SURGICAL SUPPLY — 32 items
ARMBAND PINK RESTRICT EXTREMIT (MISCELLANEOUS) ×3 IMPLANT
CANISTER SUCT 3000ML PPV (MISCELLANEOUS) ×3 IMPLANT
CANNULA VESSEL 3MM 2 BLNT TIP (CANNULA) ×3 IMPLANT
CLIP TI MEDIUM 6 (CLIP) ×3 IMPLANT
CLIP TI WIDE RED SMALL 24 (CLIP) ×3 IMPLANT
CLIP TI WIDE RED SMALL 6 (CLIP) ×3 IMPLANT
COVER PROBE W GEL 5X96 (DRAPES) ×3 IMPLANT
DERMABOND ADVANCED (GAUZE/BANDAGES/DRESSINGS) ×2
DERMABOND ADVANCED .7 DNX12 (GAUZE/BANDAGES/DRESSINGS) ×1 IMPLANT
ELECT REM PT RETURN 9FT ADLT (ELECTROSURGICAL) ×3
ELECTRODE REM PT RTRN 9FT ADLT (ELECTROSURGICAL) ×1 IMPLANT
GLOVE BIO SURGEON STRL SZ 6 (GLOVE) ×3 IMPLANT
GLOVE BIO SURGEON STRL SZ7.5 (GLOVE) ×3 IMPLANT
GLOVE BIOGEL PI IND STRL 6.5 (GLOVE) ×1 IMPLANT
GLOVE BIOGEL PI IND STRL 8 (GLOVE) ×1 IMPLANT
GLOVE BIOGEL PI INDICATOR 6.5 (GLOVE) ×2
GLOVE BIOGEL PI INDICATOR 8 (GLOVE) ×2
GOWN STRL REUS W/ TWL LRG LVL3 (GOWN DISPOSABLE) ×3 IMPLANT
GOWN STRL REUS W/TWL LRG LVL3 (GOWN DISPOSABLE) ×6
KIT BASIN OR (CUSTOM PROCEDURE TRAY) ×3 IMPLANT
KIT ROOM TURNOVER OR (KITS) ×3 IMPLANT
NS IRRIG 1000ML POUR BTL (IV SOLUTION) ×3 IMPLANT
PACK CV ACCESS (CUSTOM PROCEDURE TRAY) ×3 IMPLANT
PAD ARMBOARD 7.5X6 YLW CONV (MISCELLANEOUS) ×6 IMPLANT
SPONGE SURGIFOAM ABS GEL 100 (HEMOSTASIS) IMPLANT
SUT PROLENE 6 0 BV (SUTURE) ×9 IMPLANT
SUT VIC AB 3-0 SH 27 (SUTURE) ×2
SUT VIC AB 3-0 SH 27X BRD (SUTURE) ×1 IMPLANT
SUT VICRYL 4-0 PS2 18IN ABS (SUTURE) ×3 IMPLANT
TROCAR FLEXIPATH THORACIC 15MM (ENDOMECHANICALS) ×3 IMPLANT
UNDERPAD 30X30 (UNDERPADS AND DIAPERS) ×3 IMPLANT
WATER STERILE IRR 1000ML POUR (IV SOLUTION) ×3 IMPLANT

## 2017-01-04 NOTE — Discharge Instructions (Signed)
° ° °  01/04/2017 [name] [id number] 10-31-1971  Surgeon(s): Angelia Mould, MD  Procedure(s): REVISON OF ARTERIOVENOUS FISTULA; CEPHALIC VEIN TURNDOWN  x May stick graft on designated area only:  Only stick below incision x 4 weeks. Do NOT stick between incisions x 4 weeks. SEE DIAGRAM.

## 2017-01-04 NOTE — Anesthesia Procedure Notes (Signed)
Procedure Name: LMA Insertion Date/Time: 01/04/2017 7:35 AM Performed by: Jacquiline Doe A Pre-anesthesia Checklist: Patient identified, Emergency Drugs available, Suction available and Patient being monitored Patient Re-evaluated:Patient Re-evaluated prior to inductionOxygen Delivery Method: Circle System Utilized and Circle system utilized Preoxygenation: Pre-oxygenation with 100% oxygen Intubation Type: IV induction Ventilation: Mask ventilation without difficulty LMA: LMA inserted LMA Size: 4.0 Tube type: Oral Number of attempts: 1 Placement Confirmation: positive ETCO2 Tube secured with: Tape Dental Injury: Teeth and Oropharynx as per pre-operative assessment

## 2017-01-04 NOTE — H&P (View-Only) (Signed)
Patient name: [name] MRN: [id number] DOB: [birth year] Sex: female  REASON FOR VISIT: Aneurysm and pain of left upper arm AV fistula. Referred by Juanell Fairly, NP.  HPI: [name] is a 46 y.o. female who had a left brachiocephalic fistula placed proximally 5 years ago. This has been functioning well however, she's had pain associated with her fistula. She was sent for evaluation.  She dialyzes on Monday Wednesdays and Fridays. She denies any recent uremic symptoms. Specifically, she denies nausea, vomiting, fatigue, anorexia, or palpitations. She has lost a lot of weight and is trying to get back into exercise.  Current Outpatient Prescriptions  Medication Sig Dispense Refill  . albuterol (PROVENTIL HFA;VENTOLIN HFA) 108 (90 BASE) MCG/ACT inhaler Inhale 2 puffs into the lungs every 6 (six) hours as needed. For shortness of breath     . allopurinol (ZYLOPRIM) 300 MG tablet Take 300 mg by mouth at bedtime.     . ALPRAZolam (XANAX) 0.5 MG tablet Take 0.5 mg by mouth 3 (three) times daily.     Marland Kitchen amLODipine (NORVASC) 10 MG tablet Take 10 mg by mouth daily.    . budesonide-formoterol (SYMBICORT) 160-4.5 MCG/ACT inhaler Inhale 2 puffs into the lungs 2 (two) times daily.    . cyclobenzaprine (FLEXERIL) 10 MG tablet Take 10 mg by mouth 2 (two) times daily as needed for muscle spasms. For muscle spasms    . divalproex (DEPAKOTE) 500 MG DR tablet Take 1 tablet (500 mg total) by mouth 3 (three) times daily. 90 tablet 0  . DULoxetine (CYMBALTA) 60 MG capsule Take 60 mg by mouth daily.    Marland Kitchen esomeprazole (NEXIUM) 40 MG capsule Take 40 mg by mouth daily.     . furosemide (LASIX) 80 MG tablet Take 80 mg by mouth 2 (two) times daily.     Marland Kitchen HYDROmorphone (DILAUDID) 2 MG tablet Take 2 mg by mouth every 12 (twelve) hours as needed for severe pain.    Marland Kitchen lidocaine-prilocaine (EMLA) cream Apply 1 application topically 3 (three) times a week. For dialysis port.    Marland Kitchen lisinopril  (PRINIVIL,ZESTRIL) 40 MG tablet Take 40 mg by mouth daily.    . Melatonin 5 MG TABS Take by mouth.    . methocarbamol (ROBAXIN) 500 MG tablet Take 500 mg by mouth 2 (two) times daily.     . montelukast (SINGULAIR) 10 MG tablet Take 10 mg by mouth every morning.     . multivitamin (RENA-VIT) TABS tablet Take 1 tablet by mouth daily.    Marland Kitchen oxyCODONE (OXYCONTIN) 40 mg 12 hr tablet Take 40 mg by mouth every 12 (twelve) hours.    . promethazine (PHENERGAN) 25 MG tablet Take 25 mg by mouth every 6 (six) hours as needed. For nausea    . saccharomyces boulardii (FLORASTOR) 250 MG capsule Take 1 capsule (250 mg total) by mouth 2 (two) times daily. 30 capsule 0  . sevelamer carbonate (RENVELA) 800 MG tablet Take 800 mg by mouth 3 (three) times daily with meals.    . SUMAtriptan (IMITREX) 50 MG tablet Take 50 mg by mouth daily as needed. For migraines    . ciprofloxacin (CIPRO) 500 MG tablet Take 1 tablet (500 mg total) by mouth daily. On dialysis day, and take after your dialysis is completed. (Patient not taking: Reported on 12/29/2016) 10 tablet 0  . HYDROcodone-acetaminophen (NORCO) 10-325 MG per tablet Take 1 tablet by mouth every 6 (six) hours as needed for moderate pain.    Marland Kitchen  metroNIDAZOLE (FLAGYL) 500 MG tablet Take 1 tablet (500 mg total) by mouth 2 (two) times daily. (Patient not taking: Reported on 12/29/2016) 20 tablet 0  . oxymorphone (OPANA ER) 30 MG 12 hr tablet Take 30 mg by mouth every 12 (twelve) hours.    . prochlorperazine (COMPAZINE) 5 MG tablet Take 5 mg by mouth daily as needed for nausea.      No current facility-administered medications for this visit.     REVIEW OF SYSTEMS:  [X]  denotes positive finding, [ ]  denotes negative finding Cardiac  Comments:  Chest pain or chest pressure:    Shortness of breath upon exertion: X   Short of breath when lying flat:    Irregular heart rhythm:    Constitutional    Fever or chills:      PHYSICAL EXAM: Vitals:   12/29/16 1236  BP:  132/85  Pulse: 69  Resp: 18  Temp: 97.9 F (36.6 C)  TempSrc: Oral  SpO2: 97%  Weight: 155 lb 12.8 oz (70.7 kg)  Height: 5\' 4"  (1.626 m)    GENERAL: The patient is a well-nourished female, in no acute distress. The vital signs are documented above. CARDIOVASCULAR: There is a regular rate and rhythm. PULMONARY: There is good air exchange bilaterally without wheezing or rales. She has a markedly aneurysmal and tortuous left brachiocephalic fistula. Based on her duplex scan there is some narrowing of the cephalic vein centrally. She has a palpable left radial pulse.  MEDICAL ISSUES:  ANEURYSMAL LEFT UPPER ARM FISTULA: This patient has a large aneurysmal tortuous left upper arm fistula which she has had for many years. The aneurysms are large enough that I explained that certainly it would be an option to place new access in the right arm and continued use the fistula into the new access was ready and then ligate this. However, she feels strongly about trying to preserve the fistula. This will be challenging but is certainly possible. I would recommend doing this in 2 stages. I will start by trying to mobilize the central portion of the fistula and reimplanting this into the brachial vein in the axilla to address the upper aneurysmal tortuous segment. Once this the segment can be cannulated we could then try to address the proximal aneurysm which is very large. We could try to plicate this, posterior wall to allow for continued cannulation the segment or its possible that we would have to place an interposition graft. However, she would like to try to preserve this fistula if at all possible. We will schedule this on a nondialysis day and this has been scheduled for March 6.  Deitra Mayo [facility] [place] [phone]

## 2017-01-04 NOTE — Interval H&P Note (Signed)
History and Physical Interval Note:  01/04/2017 7:22 AM  [name]  has presented today for surgery, with the diagnosis of End stage renal disease N18.6; Left upper extremity pain M79.602; Left upper extremity arteriovenous fistula aneurysm I72.9  The various methods of treatment have been discussed with the patient and family. After consideration of risks, benefits and other options for treatment, the patient has consented to  Procedure(s): REVISON OF ARTERIOVENOUS FISTULA; CEPHALIC VEIN TURNDOWN (Left) as a surgical intervention .  The patient's history has been reviewed, patient examined, no change in status, stable for surgery.  I have reviewed the patient's chart and labs.  Questions were answered to the patient's satisfaction.     Deitra Mayo

## 2017-01-04 NOTE — Telephone Encounter (Signed)
-----   Message from Mena Goes, RN sent at 01/04/2017  9:43 AM EST ----- Regarding: 2-3 weeks with Dr. Scot Dock   ----- Message ----- From: Gabriel Earing, PA-C Sent: 01/04/2017   9:04 AM To: [facility] Charge Pool  S/p left cephalic vein turndown 06/09/63.  Will need to f/u with Dr. Scot Dock to discuss further surgery in 2-3 weeks.  Thanks, Aldona Bar

## 2017-01-04 NOTE — Telephone Encounter (Signed)
LVM on home # for PO appt 3/28, mailed lttr

## 2017-01-04 NOTE — Transfer of Care (Signed)
Immediate Anesthesia Transfer of Care Note  Patient: [name]  Procedure(s) Performed: Procedure(s): REVISON OF ARTERIOVENOUS FISTULA; CEPHALIC VEIN TURNDOWN (Left)  Patient Location: PACU  Anesthesia Type:General  Level of Consciousness: awake, oriented, sedated, patient cooperative and responds to stimulation  Airway & Oxygen Therapy: Patient Spontanous Breathing and Patient connected to nasal cannula oxygen  Post-op Assessment: Report given to RN, Post -op Vital signs reviewed and stable, Patient moving all extremities and Patient moving all extremities X 4  Post vital signs: Reviewed and stable  Last Vitals:  Vitals:   01/04/17 0926 01/04/17 0930  BP: 130/74 130/74  Pulse: 82 80  Resp: 11 11  Temp: 36.4 C     Last Pain:  Vitals:   01/04/17 0616  TempSrc: Oral         Complications: No apparent anesthesia complications

## 2017-01-04 NOTE — Progress Notes (Signed)
Depakote discontinued upon discharge as pharmacy's note states pt has not taken in over 30 days and is pt preference not to be taking medication.  Leontine Locket, [facility] 01/04/2017 9:04 AM

## 2017-01-04 NOTE — Op Note (Signed)
    NAME: JAISHA VILLACRES   MRN: [id number] DOB: 1971/05/22    DATE OF OPERATION: 01/04/2017  PREOP DIAGNOSIS: End-stage renal disease  POSTOP DIAGNOSIS: Same  PROCEDURE: Left cephalic vein turndown and revision of left brachiocephalic AV fistula  SURGEON: Judeth Cornfield. Scot Dock, MD, FACS  ASSIST: Leontine Locket, PA  ANESTHESIA: Gen.   EBL: Minimal  INDICATIONS: [name] is a 46 y.o. female who has a large aneurysmal left brachiocephalic AV fistula. I had recommended new access in the right arm and ligation of this fistula ultimately however she felt strongly about trying to salvage the fistula. I felt the only option to salvage it was to try to revise this in 2 stages. The first stage would be to relieve some of the tortuosity and the outflow stenosis with a cephalic vein turndown and then the large aneurysm above the antecubital level could be addressed separately.  FINDINGS: Excellent thrill at the completion of the procedure.  TECHNIQUE: The patient was taken to the operating room and received a general anesthetic. The entire left upper extremity was prepped and draped in usual sterile fashion. An incision was made below the antecubital level and here the high brachial vein was dissected free and controlled with a vessel loop. Next made an incision adjacent to the very tortuous cephalic vein in the upper arm and this was dissected free and fully mobilized over a long length. I didn't created a tunnel between these 2 incisions and the patient was heparinized. I ligated and divided the fistula centrally and then brought the distended vein through the tunnel. The high brachial vein was ligated distally and spatulated proximal. The fistula was clamped and then spatulated. The cephalic vein was sewn end to end to the high brachial vein using 2 continuous 6-0 Prolene sutures. The patient was an excellent thrill in the fistula. Hemostasis was obtained in the wounds and heparin was  partially reversed with protamine. The wound was closed with a deep layer of 3-0 Vicryl and the skin closed with 4-0 Vicryl. Sterile dressing was applied. The patient tolerated the procedure well and was transferred to the recovery room in stable condition. All needle and sponge counts were correct.  Deitra Mayo, MD, FACS [facility]  DATE OF DICTATION:   01/04/2017

## 2017-01-04 NOTE — Anesthesia Postprocedure Evaluation (Signed)
Anesthesia Post Note  Patient: [name]  Procedure(s) Performed: Procedure(s) (LRB): REVISON OF ARTERIOVENOUS FISTULA; CEPHALIC VEIN TURNDOWN (Left)  Patient location during evaluation: PACU Anesthesia Type: General Level of consciousness: awake and alert Pain management: pain level controlled Vital Signs Assessment: post-procedure vital signs reviewed and stable Respiratory status: spontaneous breathing, nonlabored ventilation and respiratory function stable Cardiovascular status: blood pressure returned to baseline and stable Postop Assessment: no signs of nausea or vomiting Anesthetic complications: no       Last Vitals:  Vitals:   01/04/17 1035 01/04/17 1036  BP:    Pulse: 79 77  Resp: 11 11  Temp:      Last Pain:  Vitals:   01/04/17 1032  TempSrc:   PainSc: 9                  Amarian Botero,W. EDMOND

## 2017-01-05 ENCOUNTER — Encounter (HOSPITAL_COMMUNITY): Payer: Self-pay | Admitting: Vascular Surgery

## 2017-01-06 ENCOUNTER — Telehealth: Payer: Self-pay | Admitting: *Deleted

## 2017-01-06 NOTE — Telephone Encounter (Signed)
Mr Ulanda Edison called yesterday at 49 stating that his wife's arm was cold and swollen from the elbow to the fingertips. Pt had revision of her Left upper arm AVF and cephalic turndown on 01-02-74 by Dr. Scot Dock. I tried to call them back at 1710 yesterday and had to [facility].   I called again today and left messages. My plan is to bring her in this afternoon to see our PA ( Dr. Scot Dock is also in the office today if needed).  She has HD on M,W,F at [facility]. I called Tina at the [facility] and she said that [place] refused to come in yesterday for treatment and their nurse was also trying to reach her but is having to leave messages also. I told Otila Kluver about our plan to bring her in this afternoon. She will keep trying to reach [place] as will I.

## 2017-01-07 ENCOUNTER — Telehealth: Payer: Self-pay

## 2017-01-07 ENCOUNTER — Emergency Department (HOSPITAL_COMMUNITY): Payer: Medicare Other

## 2017-01-07 ENCOUNTER — Emergency Department (HOSPITAL_COMMUNITY)
Admission: EM | Admit: 2017-01-07 | Discharge: 2017-01-07 | Disposition: A | Discharge status: Home / Self Care | Payer: Medicare Other | Attending: Emergency Medicine | Admitting: Emergency Medicine

## 2017-01-07 ENCOUNTER — Encounter (HOSPITAL_COMMUNITY): Payer: Self-pay

## 2017-01-07 DIAGNOSIS — Z79899 Other long term (current) drug therapy: Secondary | ICD-10-CM | POA: Diagnosis not present

## 2017-01-07 DIAGNOSIS — I120 Hypertensive chronic kidney disease with stage 5 chronic kidney disease or end stage renal disease: Secondary | ICD-10-CM | POA: Insufficient documentation

## 2017-01-07 DIAGNOSIS — J45909 Unspecified asthma, uncomplicated: Secondary | ICD-10-CM | POA: Insufficient documentation

## 2017-01-07 DIAGNOSIS — N186 End stage renal disease: Secondary | ICD-10-CM | POA: Diagnosis not present

## 2017-01-07 DIAGNOSIS — M79602 Pain in left arm: Secondary | ICD-10-CM | POA: Diagnosis not present

## 2017-01-07 DIAGNOSIS — G8918 Other acute postprocedural pain: Secondary | ICD-10-CM | POA: Diagnosis not present

## 2017-01-07 DIAGNOSIS — R200 Anesthesia of skin: Secondary | ICD-10-CM | POA: Diagnosis not present

## 2017-01-07 DIAGNOSIS — F17210 Nicotine dependence, cigarettes, uncomplicated: Secondary | ICD-10-CM | POA: Diagnosis not present

## 2017-01-07 DIAGNOSIS — F909 Attention-deficit hyperactivity disorder, unspecified type: Secondary | ICD-10-CM | POA: Diagnosis not present

## 2017-01-07 DIAGNOSIS — R079 Chest pain, unspecified: Secondary | ICD-10-CM | POA: Diagnosis not present

## 2017-01-07 LAB — BASIC METABOLIC PANEL
ANION GAP: 14 (ref 5–15)
BUN: 53 mg/dL — AB (ref 6–20)
CALCIUM: 10.2 mg/dL (ref 8.9–10.3)
CO2: 27 mmol/L (ref 22–32)
CREATININE: 9.85 mg/dL — AB (ref 0.44–1.00)
Chloride: 98 mmol/L — ABNORMAL LOW (ref 101–111)
GFR calc Af Amer: 5 mL/min — ABNORMAL LOW (ref >60)
GFR, EST NON AFRICAN AMERICAN: 4 mL/min — AB (ref >60)
GLUCOSE: 96 mg/dL (ref 65–99)
Potassium: 4.5 mmol/L (ref 3.5–5.1)
Sodium: 139 mmol/L (ref 135–145)

## 2017-01-07 LAB — I-STAT TROPONIN, ED
TROPONIN I, POC: 0 ng/mL (ref 0.00–0.08)
Troponin i, poc: 0.01 ng/mL (ref 0.00–0.08)

## 2017-01-07 LAB — CBC
HCT: 30 % — ABNORMAL LOW (ref 36.0–46.0)
HEMOGLOBIN: 9.2 g/dL — AB (ref 12.0–15.0)
MCH: 29.5 pg (ref 26.0–34.0)
MCHC: 30.7 g/dL (ref 30.0–36.0)
MCV: 96.2 fL (ref 78.0–100.0)
Platelets: 154 10*3/uL (ref 150–400)
RBC: 3.12 MIL/uL — ABNORMAL LOW (ref 3.87–5.11)
RDW: 15.5 % (ref 11.5–15.5)
WBC: 6.3 10*3/uL (ref 4.0–10.5)

## 2017-01-07 MED ORDER — OXYCODONE-ACETAMINOPHEN 5-325 MG PO TABS
1.0000 | ORAL_TABLET | Freq: Once | ORAL | Status: AC
  Administered 2017-01-07: 1 via ORAL
  Filled 2017-01-07: qty 1

## 2017-01-07 MED ORDER — OXYCODONE HCL 5 MG PO TABS
5.0000 mg | ORAL_TABLET | Freq: Once | ORAL | Status: AC
  Administered 2017-01-07: 5 mg via ORAL
  Filled 2017-01-07: qty 1

## 2017-01-07 MED ORDER — ONDANSETRON HCL 4 MG/2ML IJ SOLN
4.0000 mg | Freq: Once | INTRAMUSCULAR | Status: AC
  Administered 2017-01-07: 4 mg via INTRAVENOUS
  Filled 2017-01-07: qty 2

## 2017-01-07 MED ORDER — IOPAMIDOL (ISOVUE-370) INJECTION 76%
INTRAVENOUS | Status: AC
  Administered 2017-01-07: 100 mL
  Filled 2017-01-07: qty 100

## 2017-01-07 NOTE — ED Provider Notes (Signed)
[facility] DEPT Provider Note   CSN: [id number] Arrival date & time: 01/07/17  1612     History   Chief Complaint Chief Complaint  Patient presents with  . Post-op Problem    HPI [name] is a 46 y.o. female.  HPI   46 year old female with history of anemia, CAD, ESRD on dialysis Monday Wednesday Friday, with last dialysis 4 days ago, with recent revision to her left upper extremity fistula secondary to dysfunction, with procedure performed by vascular surgery on 01/04/17, who presents with pain in her left upper extremity that started the same night as her procedure. Pain is described as sharp pressure sensation in her left arm. Also reports tingling her left arm that radiates down her left hand. Pain also radiates to the left side of her chest. Pain is nonexertional and nonpleuritic. Denies shortness of breath. Persistent nausea and vomiting throughout the day today. Denies fevers. States she feels as if she is progressively losing sensation in her left hand. She has not been to dialysis for the past 4 days because of the pain.  Past Medical History:  Diagnosis Date  . ADD (attention deficit disorder)   . Anemia   . Anemia   . Anxiety   . Arthritis    knee, wrist, back   . Asthma   . Blood transfusion 06/2011   6 units transfused at [facility]  . Bronchitis   . Depression   . Dizziness   . ESRD (end stage renal disease) ([place])    Hemodialysis 3 times a  week - [place]  . Gastritis   . GERD (gastroesophageal reflux disease)   . Gout   . Headache(784.0)   . Hypertension    sees Dr. Wendie Agreste  . Neuromuscular disorder (HCC)    carpal tunnel  . Obesity   . Reflux   . Renal insufficiency   . Shortness of breath   . Sleep apnea    does not use CPAP    Patient Active Problem List   Diagnosis Date Noted  . Sepsis ([place]) 02/15/2014  . Cellulitis 02/14/2014  . Thinning of skin 04/09/2013  . Mechanical complication of other vascular device, implant, and  graft 04/09/2013  . Pain, limb, left 10/18/2012  . Swollen 10/18/2012  . Status post endometrial ablation 12/10/2011  . End stage renal disease ([place]) 11/24/2011  . Renal failure (ARF), acute on chronic (HCC) 10/19/2011  . HTN (hypertension) 10/19/2011  . Anemia 10/19/2011    Past Surgical History:  Procedure Laterality Date  . AV FISTULA PLACEMENT  06/10/11   Left brachiocephalic AVF  . AV Fistula surgery  11/30/11   [facility] - redo fistula  . Carpel tunnel release     x 2; bilateral  . CESAREAN SECTION    . colonscopy    . DILATION AND CURETTAGE OF UTERUS    . HYSTEROSCOPY WITH THERMACHOICE  12/10/2011   Procedure: HYSTEROSCOPY WITH THERMACHOICE;  Surgeon: Betsy Coder, MD;  Location: [facility];  Service: Gynecology;  Laterality: N/A;  . IRRIGATION AND DEBRIDEMENT ABSCESS Left 02/15/2014   Procedure: IRRIGATION AND DRAINAGE OF LOW BACK ABSCESS;  Surgeon: Imogene Burn. Georgette Dover, MD;  Location: [facility];  Service: General;  Laterality: Left;  . microdiskectomy  04/2001   Herniated disk L5-S1  . RENAL BIOPSY  04/2004  . REVISON OF ARTERIOVENOUS FISTULA Left 01/04/2017   Procedure: REVISON OF ARTERIOVENOUS FISTULA; CEPHALIC VEIN TURNDOWN;  Surgeon: Angelia Mould, MD;  Location: [facility];  Service:  Vascular;  Laterality: Left;  . SHUNTOGRAM  Dec. 18, 2013   Left arm  . SHUNTOGRAM N/A 10/18/2012   Procedure: Earney Mallet;  Surgeon: Conrad Hillsboro, MD;  Location: [facility] C[facility] LAB;  Service: Cardiovascular;  Laterality: N/A;  . SHUNTOGRAM Left 04/18/2013   Procedure: Earney Mallet;  Surgeon: Serafina Mitchell, MD;  Location: [facility] CATH LAB;  Service: Cardiovascular;  Laterality: Left;  . SPINE SURGERY  2004   Lumbar diskectomy   . UPPER GASTROINTESTINAL ENDOSCOPY    . WISDOM TOOTH EXTRACTION      OB History    No data available       Home Medications    Prior to Admission medications   Medication Sig Start Date End Date Taking? Authorizing Provider  albuterol (PROVENTIL HFA;VENTOLIN HFA) 108 (90  BASE) MCG/ACT inhaler Inhale 2 puffs into the lungs every 6 (six) hours as needed. For shortness of breath     Historical Provider, MD  ALPRAZolam Duanne Moron) 0.5 MG tablet Take 0.5 mg by mouth 3 (three) times daily.     Historical Provider, MD  amLODipine (NORVASC) 10 MG tablet Take 10 mg by mouth daily. 02/08/14   Historical Provider, MD  budesonide-formoterol (SYMBICORT) 160-4.5 MCG/ACT inhaler Inhale 2 puffs into the lungs 2 (two) times daily.    Historical Provider, MD  Calcium Acetate, Phos Binder, (CALCIUM ACETATE PO) Take 667 mg by mouth 3 (three) times daily after meals. Takes 3 with meals    Historical Provider, MD  cyclobenzaprine (FLEXERIL) 10 MG tablet Take 10 mg by mouth 2 (two) times daily as needed for muscle spasms. For muscle spasms    Historical Provider, MD  furosemide (LASIX) 80 MG tablet Take 80 mg by mouth 2 (two) times daily.     Historical Provider, MD  hydrALAZINE (APRESOLINE) 50 MG tablet Take 50 mg by mouth 3 (three) times daily.    Historical Provider, MD  HYDROmorphone (DILAUDID) 2 MG tablet Take 2 mg by mouth every 12 (twelve) hours as needed for severe pain.    Historical Provider, MD  lidocaine-prilocaine (EMLA) cream Apply 1 application topically every Monday, Wednesday, and Friday. For dialysis port.    Historical Provider, MD  lisinopril (PRINIVIL,ZESTRIL) 40 MG tablet Take 40 mg by mouth daily.    Historical Provider, MD  Melatonin 5 MG TABS Take 5 mg by mouth 2 (two) times daily.     Historical Provider, MD  methocarbamol (ROBAXIN) 500 MG tablet Take 500 mg by mouth 2 (two) times daily.  03/22/13   Historical Provider, MD  montelukast (SINGULAIR) 10 MG tablet Take 10 mg by mouth every morning.     Historical Provider, MD  multivitamin (RENA-VIT) TABS tablet Take 1 tablet by mouth daily.    Historical Provider, MD  oxyCODONE (OXYCONTIN) 40 mg 12 hr tablet Take 40 mg by mouth every 12 (twelve) hours.    Historical Provider, MD  oxyCODONE-acetaminophen (ROXICET) 5-325 MG  tablet Take 1 tablet by mouth every 6 (six) hours as needed for severe pain. 01/04/17   Samantha J Rhyne, PA-C  promethazine (PHENERGAN) 25 MG tablet Take 25 mg by mouth every 6 (six) hours as needed for nausea.     Historical Provider, MD  sevelamer carbonate (RENVELA) 800 MG tablet Take 800 mg by mouth 3 (three) times daily with meals.    Historical Provider, MD  SUMAtriptan (IMITREX) 50 MG tablet Take 50 mg by mouth daily as needed for migraine or headache.  02/14/14   Historical Provider, MD  Family History Family History  Problem Relation Age of Onset  . Hypertension Mother   . Heart disease Father     Heart Disease before age 38  . Hypertension Father   . Heart attack Father   . Hypertension Daughter     Social History Social History  Substance Use Topics  . Smoking status: Current Every Day Smoker    Packs/day: 0.50    Years: 18.00    Types: Cigarettes  . Smokeless tobacco: Never Used  . Alcohol use Yes     Comment: ocassional     Allergies   Nsaids; Clarithromycin; Demerol; Fentanyl; Morphine and related; Other; Vancomycin; and Clindamycin/lincomycin   Review of Systems Review of Systems  Constitutional: Positive for chills. Negative for activity change and fever.  HENT: Negative for congestion, rhinorrhea and sore throat.   Eyes: Negative for visual disturbance.  Respiratory: Negative for cough, shortness of breath and wheezing.   Cardiovascular: Positive for chest pain and leg swelling. Negative for palpitations.  Gastrointestinal: Positive for nausea and vomiting. Negative for abdominal distention, abdominal pain, constipation and diarrhea.  Genitourinary: Negative for flank pain and frequency.  Musculoskeletal: Negative for arthralgias, back pain, gait problem, joint swelling, myalgias (Left arm), neck pain and neck stiffness.  Skin: Positive for color change (L arm). Negative for rash.  Neurological: Negative for dizziness, tremors, syncope, facial asymmetry,  speech difficulty, weakness and headaches.       Tingling left hand  Psychiatric/Behavioral: Negative for agitation, behavioral problems and confusion.     Physical Exam Updated Vital Signs BP 145/82 (BP Location: Right Arm)   Pulse 98   Temp 99.1 F (37.3 C) (Oral)   Resp 22   Ht 5\' 4"  (1.626 m)   Wt 70.3 kg   SpO2 100%   BMI 26.61 kg/m   Physical Exam  Constitutional: She is oriented to person, place, and time. She appears well-developed and well-nourished. No distress.  HENT:  Head: Normocephalic and atraumatic.  Right Ear: External ear normal.  Left Ear: External ear normal.  Nose: Nose normal.  Mouth/Throat: Oropharynx is clear and moist. No oropharyngeal exudate.  Eyes: Conjunctivae and EOM are normal. Pupils are equal, round, and reactive to light. Right eye exhibits no discharge. Left eye exhibits no discharge.  Neck: Normal range of motion. Neck supple.  Cardiovascular: Normal rate, regular rhythm and normal heart sounds.  Exam reveals no gallop and no friction rub.   No murmur heard. Pulmonary/Chest: Breath sounds normal. No respiratory distress. She has no wheezes. She has no rales.  Abdominal: Soft. Bowel sounds are normal. She exhibits no distension and no mass. There is no tenderness. There is no rebound and no guarding.  Musculoskeletal: Normal range of motion. She exhibits edema (mild swelling to the left upper arm, 2+ pitting edema to the bilateral LEs) and tenderness (left upper arm).  Fistula with palpable thrill to L upper arm. Ecchymosis to the ventral aspect of the left upper arm. Incision to the inner aspect of the left upper arm and L axilla, C/D/I with no erythema or drainage. Palpable thrill to fistulas. No axillary swelling.   Neurological: She is alert and oriented to person, place, and time. She exhibits normal muscle tone.  Decreased sensation to light touch in all distributions of the left arm below the axilla  Skin: Skin is warm and dry. No rash  noted. She is not diaphoretic.  Psychiatric: She has a normal mood and affect. Her behavior is normal. Judgment and thought  content normal.     ED Treatments / Results  Labs (all labs ordered are listed, but only abnormal results are displayed) Labs Reviewed  BASIC METABOLIC PANEL - Abnormal; Notable for the following:       Result Value   Chloride 98 (*)    BUN 53 (*)    Creatinine, Ser 9.85 (*)    GFR calc non Af Amer 4 (*)    GFR calc Af Amer 5 (*)    All other components within normal limits  CBC - Abnormal; Notable for the following:    RBC 3.12 (*)    Hemoglobin 9.2 (*)    HCT 30.0 (*)    All other components within normal limits  I-STAT TROPOININ, ED    EKG  EKG Interpretation None       Radiology Dg Chest 2 View  Result Date: 01/07/2017 CLINICAL DATA:  Left-sided chest pain EXAM: CHEST  2 VIEW COMPARISON:  01/31/2015 FINDINGS: Mild cardiomegaly without overt failure. No acute infiltrate or effusion. No pneumothorax. IMPRESSION: Mild cardiomegaly without overt failure Electronically Signed   By: Donavan Foil M.D.   On: 01/07/2017 17:46    Procedures Procedures (including critical care time)  Medications Ordered in ED Medications - No data to display   Initial Impression / Assessment and Plan / ED Course  I have reviewed the triage vital signs and the nursing notes.  Pertinent labs & imaging results that were available during my care of the patient were reviewed by me and considered in my medical decision making (see chart for details).     Generally well-appearing. Afebrile and hemodynamically stable. Patient has mild swelling to her left upper extremity, with ecchymosis to the ventral aspect of her arm. Incisions are clean, dry, and intact. She has intact sensation to light touch in all neurologic distributions of the arm, and good peripheral pulses. Patient has not been dialyzed in 4 days, but labs do not indicate emergent need for dialysis. Potassium  within normal limits, and patient does not have any increased work of breathing. Lungs clear. CTA of the left upper extremity ordered to evaluate fistula, that shows patent left upper extremity arterial distribution, and moderate stenosis of the left brachial artery with no hemodynamically significant central stenosis. On reassessment, patient states that her pain is improved after a dose of Percocet. I've asked her to follow up with vascular surgery for further management of what is likely postoperative pain. Patient will also call her dialysis center in the morning to try to get appointment for dialysis this weekend. Delta troponins obtained for evaluation of chest pain, which are negative 2. Description of chest pain as worse with certain movements and radiating from the left arm is not consistent with cardiac chest pain and I have a low suspicion for CAD. Pain is nonpleuritic and I doubt PE. Patient is stable for discharge home with vascular follow-up.  Final Clinical Impressions(s) / ED Diagnoses   Final diagnoses:  None    New Prescriptions New Prescriptions   No medications on file     Jenifer Algernon Huxley, MD 01/07/17 2357    Leo Grosser, MD 01/08/17 [phone]

## 2017-01-07 NOTE — Telephone Encounter (Signed)
Rec'd call from pt.  Reported she has pain shooting down left arm from her incision.  C/o (L) hand coolness, and numbness of left middle, ring, and little fingers.  Reported a lot of swelling of left hand.  Stated it hurts to try to do any range of motion with the left hand.  Stated it hurts to grip with the hand.  Discussed with Dr. Donzetta Matters.  Recommended pt. needs a Steal study of left upper extremity.  Called pt. Offered appt. for 11:00 AM for vasc. appt.  Pt. stated she is in [place], and doesn't have transportation to get to [facility] by 11:00 AM.  Advised the pt. she will need to be evaluated in the ER to rule out steal.  Also advised that she will need to contact pain management for future refills of Oxycodone.  Pt. Verb. understanding.

## 2017-01-07 NOTE — ED Triage Notes (Signed)
Pt reports left arm pain secondary to having her fistula "re-routed" on Tuesday here at [facility]. + sensation and able to wiggle her fingers but states sharp pains are shooting down her arm with numbness and tingling since the procedure.

## 2017-01-07 NOTE — ED Notes (Signed)
Taken to CT.

## 2017-01-07 NOTE — ED Notes (Signed)
EKG given to Dr. Laneta Simmers

## 2017-01-07 NOTE — ED Notes (Signed)
Pt stable, ambulatory, states understanding of discharge instructions 

## 2017-01-08 DIAGNOSIS — D631 Anemia in chronic kidney disease: Secondary | ICD-10-CM | POA: Diagnosis not present

## 2017-01-08 DIAGNOSIS — N186 End stage renal disease: Secondary | ICD-10-CM | POA: Diagnosis not present

## 2017-01-08 DIAGNOSIS — N2581 Secondary hyperparathyroidism of renal origin: Secondary | ICD-10-CM | POA: Diagnosis not present

## 2017-01-10 DIAGNOSIS — N2581 Secondary hyperparathyroidism of renal origin: Secondary | ICD-10-CM | POA: Diagnosis not present

## 2017-01-10 DIAGNOSIS — D631 Anemia in chronic kidney disease: Secondary | ICD-10-CM | POA: Diagnosis not present

## 2017-01-10 DIAGNOSIS — N186 End stage renal disease: Secondary | ICD-10-CM | POA: Diagnosis not present

## 2017-01-12 DIAGNOSIS — N186 End stage renal disease: Secondary | ICD-10-CM | POA: Diagnosis not present

## 2017-01-12 DIAGNOSIS — N2581 Secondary hyperparathyroidism of renal origin: Secondary | ICD-10-CM | POA: Diagnosis not present

## 2017-01-12 DIAGNOSIS — D631 Anemia in chronic kidney disease: Secondary | ICD-10-CM | POA: Diagnosis not present

## 2017-01-13 ENCOUNTER — Other Ambulatory Visit: Payer: Self-pay | Admitting: *Deleted

## 2017-01-13 ENCOUNTER — Encounter: Payer: Self-pay | Admitting: Vascular Surgery

## 2017-01-13 DIAGNOSIS — N186 End stage renal disease: Secondary | ICD-10-CM

## 2017-01-13 DIAGNOSIS — M79602 Pain in left arm: Secondary | ICD-10-CM

## 2017-01-17 ENCOUNTER — Ambulatory Visit (HOSPITAL_COMMUNITY)
Admission: RE | Admit: 2017-01-17 | Discharge: 2017-01-17 | Disposition: A | Discharge status: Home / Self Care | Payer: Medicare Other | Source: Ambulatory Visit | Attending: Vascular Surgery | Admitting: Vascular Surgery

## 2017-01-17 DIAGNOSIS — M79602 Pain in left arm: Secondary | ICD-10-CM | POA: Insufficient documentation

## 2017-01-17 DIAGNOSIS — N186 End stage renal disease: Secondary | ICD-10-CM | POA: Diagnosis not present

## 2017-01-19 DIAGNOSIS — N2581 Secondary hyperparathyroidism of renal origin: Secondary | ICD-10-CM | POA: Diagnosis not present

## 2017-01-19 DIAGNOSIS — D631 Anemia in chronic kidney disease: Secondary | ICD-10-CM | POA: Diagnosis not present

## 2017-01-19 DIAGNOSIS — N186 End stage renal disease: Secondary | ICD-10-CM | POA: Diagnosis not present

## 2017-01-21 DIAGNOSIS — N2581 Secondary hyperparathyroidism of renal origin: Secondary | ICD-10-CM | POA: Diagnosis not present

## 2017-01-21 DIAGNOSIS — N186 End stage renal disease: Secondary | ICD-10-CM | POA: Diagnosis not present

## 2017-01-21 DIAGNOSIS — D631 Anemia in chronic kidney disease: Secondary | ICD-10-CM | POA: Diagnosis not present

## 2017-01-24 DIAGNOSIS — N2581 Secondary hyperparathyroidism of renal origin: Secondary | ICD-10-CM | POA: Diagnosis not present

## 2017-01-24 DIAGNOSIS — D631 Anemia in chronic kidney disease: Secondary | ICD-10-CM | POA: Diagnosis not present

## 2017-01-24 DIAGNOSIS — N186 End stage renal disease: Secondary | ICD-10-CM | POA: Diagnosis not present

## 2017-01-26 ENCOUNTER — Ambulatory Visit (INDEPENDENT_AMBULATORY_CARE_PROVIDER_SITE_OTHER): Payer: Self-pay | Admitting: Vascular Surgery

## 2017-01-26 ENCOUNTER — Encounter: Payer: Self-pay | Admitting: Vascular Surgery

## 2017-01-26 VITALS — BP 126/64 | HR 80 | Temp 98.5°F | Resp 16 | Ht 64.0 in | Wt 157.0 lb

## 2017-01-26 DIAGNOSIS — D631 Anemia in chronic kidney disease: Secondary | ICD-10-CM | POA: Diagnosis not present

## 2017-01-26 DIAGNOSIS — Z992 Dependence on renal dialysis: Secondary | ICD-10-CM

## 2017-01-26 DIAGNOSIS — N186 End stage renal disease: Secondary | ICD-10-CM | POA: Diagnosis not present

## 2017-01-26 DIAGNOSIS — N2581 Secondary hyperparathyroidism of renal origin: Secondary | ICD-10-CM | POA: Diagnosis not present

## 2017-01-26 NOTE — Progress Notes (Signed)
Patient name: [name] MRN: [id number] DOB: [birth year] Sex: female  REASON FOR VISIT: Follow up  HPI: [name] is a 46 y.o. female who had a large aneurysmal left brachiocephalic fistula. I recommended The right arm and ligation of this fistula or she felt strongly about trying to salvage the fistula. I felt that the only option would be to revise this in 2 stages. The first stage will be relieved some of the tortuosity and the outflow stenosis in the cephalic vein with a turndown and then the large aneurysm above the antecubital level could be addressed separately. The first stage was performed on 01/04/2017.  She continues to have some paresthesias in the left arm from mild steal but feels that her symptoms are tolerable. She dialyzes Monday Wednesdays and Fridays in [place].  Current Outpatient Prescriptions  Medication Sig Dispense Refill  . albuterol (PROVENTIL HFA;VENTOLIN HFA) 108 (90 BASE) MCG/ACT inhaler Inhale 2 puffs into the lungs every 6 (six) hours as needed for shortness of breath.     . ALPRAZolam (XANAX) 1 MG tablet Take 1 mg by mouth 3 (three) times daily.    Marland Kitchen amLODipine (NORVASC) 10 MG tablet Take 10 mg by mouth at bedtime.     Marland Kitchen b complex vitamins tablet Take 1 tablet by mouth daily.    . budesonide-formoterol (SYMBICORT) 160-4.5 MCG/ACT inhaler Inhale 2 puffs into the lungs 2 (two) times daily.    . calcium acetate (PHOSLO) 667 MG capsule Take 2,001 mg by mouth 2 (two) times daily with a meal.    . cyclobenzaprine (FLEXERIL) 10 MG tablet Take 20 mg by mouth at bedtime.     . furosemide (LASIX) 80 MG tablet Take 80 mg by mouth 2 (two) times daily.     . hydrALAZINE (APRESOLINE) 50 MG tablet Take 50 mg by mouth 3 (three) times daily.    Marland Kitchen HYDROmorphone (DILAUDID) 2 MG tablet Take 2 mg by mouth every 12 (twelve) hours as needed for severe pain.    Marland Kitchen lidocaine-prilocaine (EMLA) cream Apply 1 application topically See admin instructions. Apply  topically one hour prior to dialysis - Monday, Wednesday, Friday    . lisinopril (PRINIVIL,ZESTRIL) 40 MG tablet Take 40 mg by mouth daily.    . Melatonin 5 MG TABS Take 5 mg by mouth at bedtime.     . methocarbamol (ROBAXIN) 500 MG tablet Take 500 mg by mouth 2 (two) times daily.     . montelukast (SINGULAIR) 10 MG tablet Take 10 mg by mouth daily.     . multivitamin (RENA-VIT) TABS tablet Take 1 tablet by mouth daily.    Marland Kitchen oxyCODONE (OXYCONTIN) 40 mg 12 hr tablet Take 40 mg by mouth every 12 (twelve) hours.    . promethazine (PHENERGAN) 25 MG tablet Take 25 mg by mouth every 6 (six) hours as needed for nausea.     . sevelamer carbonate (RENVELA) 800 MG tablet Take 2,400 mg by mouth 2 (two) times daily with a meal.     . SUMAtriptan (IMITREX) 50 MG tablet Take 50 mg by mouth daily as needed for migraine or headache.      No current facility-administered medications for this visit.     REVIEW OF SYSTEMS:  [X]  denotes positive finding, [ ]  denotes negative finding Cardiac  Comments:  Chest pain or chest pressure:    Shortness of breath upon exertion:    Short of breath when lying flat:    Irregular heart rhythm:  Constitutional    Fever or chills:      PHYSICAL EXAM: Vitals:   01/26/17 0915  BP: 126/64  Pulse: 80  Resp: 16  Temp: 98.5 F (36.9 C)  TempSrc: Oral  SpO2: 98%  Weight: 157 lb (71.2 kg)  Height: 5\' 4"  (1.626 m)    GENERAL: The patient is a well-nourished female, in no acute distress. The vital signs are documented above. CARDIOVASCULAR: There is a regular rate and rhythm. PULMONARY: There is good air exchange bilaterally without wheezing or rales. She has a diminished left radial pulse. She has a good thrill in her fistula. Her incisions are healing adequately.  BILATERAL UPPER EXTREMITY ARTERIAL DOPPLER STUDY:  I have independently interpreted her bilateral upper extremity arterial Doppler study. This shows triphasic Doppler signals in the right upper  extremity and in the radial and ulnar positions. On the left side there are monophasic signals in the radial and ulnar position. The left radial pressure is 69 mmHg at baseline but with compression of the fistula improved to 109 mmHg.  MEDICAL ISSUES:  END-STAGE RENAL DISEASE: All things considered, again I think the best option would probably be to place new access in the right arm and then once this is working to ligate this fistula in the left arm. However she feels strongly about trying to salvage this. This reason we will stick with the original plan. Once the new area where the fistula was revised can be cannulated, at the end of April, then we can plan revision of the large aneurysmal segment in the proximal fistula. This will be tricky given the length of this but my plan was to try to plicate the posterior wall and perhaps narrow with some given her steal symptoms. We'll be sure that they're able to cannulate the newly revised segment and the end of April before we plan on revising the proximal fistula as they will have to get both needles in that segment for dialysis. She will call when she is ready to schedule the second stage of her revision in early May.  Deitra Mayo [facility] [phone]

## 2017-01-27 DIAGNOSIS — G5702 Lesion of sciatic nerve, left lower limb: Secondary | ICD-10-CM | POA: Diagnosis not present

## 2017-01-27 DIAGNOSIS — M5417 Radiculopathy, lumbosacral region: Secondary | ICD-10-CM | POA: Diagnosis not present

## 2017-01-27 DIAGNOSIS — M549 Dorsalgia, unspecified: Secondary | ICD-10-CM | POA: Diagnosis not present

## 2017-01-27 DIAGNOSIS — D689 Coagulation defect, unspecified: Secondary | ICD-10-CM | POA: Diagnosis not present

## 2017-01-27 DIAGNOSIS — Z79899 Other long term (current) drug therapy: Secondary | ICD-10-CM | POA: Diagnosis not present

## 2017-01-27 DIAGNOSIS — N186 End stage renal disease: Secondary | ICD-10-CM | POA: Diagnosis not present

## 2017-01-27 DIAGNOSIS — Z5181 Encounter for therapeutic drug level monitoring: Secondary | ICD-10-CM | POA: Diagnosis not present

## 2017-01-28 DIAGNOSIS — D631 Anemia in chronic kidney disease: Secondary | ICD-10-CM | POA: Diagnosis not present

## 2017-01-28 DIAGNOSIS — N186 End stage renal disease: Secondary | ICD-10-CM | POA: Diagnosis not present

## 2017-01-28 DIAGNOSIS — N2581 Secondary hyperparathyroidism of renal origin: Secondary | ICD-10-CM | POA: Diagnosis not present

## 2017-01-29 DIAGNOSIS — Z992 Dependence on renal dialysis: Secondary | ICD-10-CM | POA: Diagnosis not present

## 2017-01-29 DIAGNOSIS — N033 Chronic nephritic syndrome with diffuse mesangial proliferative glomerulonephritis: Secondary | ICD-10-CM | POA: Diagnosis not present

## 2017-01-29 DIAGNOSIS — N186 End stage renal disease: Secondary | ICD-10-CM | POA: Diagnosis not present

## 2017-01-31 DIAGNOSIS — N186 End stage renal disease: Secondary | ICD-10-CM | POA: Diagnosis not present

## 2017-01-31 DIAGNOSIS — D509 Iron deficiency anemia, unspecified: Secondary | ICD-10-CM | POA: Diagnosis not present

## 2017-01-31 DIAGNOSIS — D631 Anemia in chronic kidney disease: Secondary | ICD-10-CM | POA: Diagnosis not present

## 2017-01-31 DIAGNOSIS — N2581 Secondary hyperparathyroidism of renal origin: Secondary | ICD-10-CM | POA: Diagnosis not present

## 2017-02-02 DIAGNOSIS — D509 Iron deficiency anemia, unspecified: Secondary | ICD-10-CM | POA: Diagnosis not present

## 2017-02-02 DIAGNOSIS — N186 End stage renal disease: Secondary | ICD-10-CM | POA: Diagnosis not present

## 2017-02-02 DIAGNOSIS — N2581 Secondary hyperparathyroidism of renal origin: Secondary | ICD-10-CM | POA: Diagnosis not present

## 2017-02-02 DIAGNOSIS — D631 Anemia in chronic kidney disease: Secondary | ICD-10-CM | POA: Diagnosis not present

## 2017-02-04 DIAGNOSIS — D509 Iron deficiency anemia, unspecified: Secondary | ICD-10-CM | POA: Diagnosis not present

## 2017-02-04 DIAGNOSIS — N186 End stage renal disease: Secondary | ICD-10-CM | POA: Diagnosis not present

## 2017-02-04 DIAGNOSIS — D631 Anemia in chronic kidney disease: Secondary | ICD-10-CM | POA: Diagnosis not present

## 2017-02-04 DIAGNOSIS — N2581 Secondary hyperparathyroidism of renal origin: Secondary | ICD-10-CM | POA: Diagnosis not present

## 2017-02-07 DIAGNOSIS — D631 Anemia in chronic kidney disease: Secondary | ICD-10-CM | POA: Diagnosis not present

## 2017-02-07 DIAGNOSIS — N2581 Secondary hyperparathyroidism of renal origin: Secondary | ICD-10-CM | POA: Diagnosis not present

## 2017-02-07 DIAGNOSIS — N186 End stage renal disease: Secondary | ICD-10-CM | POA: Diagnosis not present

## 2017-02-07 DIAGNOSIS — D509 Iron deficiency anemia, unspecified: Secondary | ICD-10-CM | POA: Diagnosis not present

## 2017-02-10 DIAGNOSIS — G43009 Migraine without aura, not intractable, without status migrainosus: Secondary | ICD-10-CM | POA: Diagnosis not present

## 2017-02-10 DIAGNOSIS — F329 Major depressive disorder, single episode, unspecified: Secondary | ICD-10-CM | POA: Diagnosis not present

## 2017-02-10 DIAGNOSIS — I10 Essential (primary) hypertension: Secondary | ICD-10-CM | POA: Diagnosis not present

## 2017-02-10 DIAGNOSIS — J449 Chronic obstructive pulmonary disease, unspecified: Secondary | ICD-10-CM | POA: Diagnosis not present

## 2017-02-11 DIAGNOSIS — N2581 Secondary hyperparathyroidism of renal origin: Secondary | ICD-10-CM | POA: Diagnosis not present

## 2017-02-11 DIAGNOSIS — D631 Anemia in chronic kidney disease: Secondary | ICD-10-CM | POA: Diagnosis not present

## 2017-02-11 DIAGNOSIS — D509 Iron deficiency anemia, unspecified: Secondary | ICD-10-CM | POA: Diagnosis not present

## 2017-02-11 DIAGNOSIS — N186 End stage renal disease: Secondary | ICD-10-CM | POA: Diagnosis not present

## 2017-02-14 DIAGNOSIS — D509 Iron deficiency anemia, unspecified: Secondary | ICD-10-CM | POA: Diagnosis not present

## 2017-02-14 DIAGNOSIS — N186 End stage renal disease: Secondary | ICD-10-CM | POA: Diagnosis not present

## 2017-02-14 DIAGNOSIS — N2581 Secondary hyperparathyroidism of renal origin: Secondary | ICD-10-CM | POA: Diagnosis not present

## 2017-02-14 DIAGNOSIS — D631 Anemia in chronic kidney disease: Secondary | ICD-10-CM | POA: Diagnosis not present

## 2017-02-16 DIAGNOSIS — N186 End stage renal disease: Secondary | ICD-10-CM | POA: Diagnosis not present

## 2017-02-16 DIAGNOSIS — D509 Iron deficiency anemia, unspecified: Secondary | ICD-10-CM | POA: Diagnosis not present

## 2017-02-16 DIAGNOSIS — D631 Anemia in chronic kidney disease: Secondary | ICD-10-CM | POA: Diagnosis not present

## 2017-02-16 DIAGNOSIS — N2581 Secondary hyperparathyroidism of renal origin: Secondary | ICD-10-CM | POA: Diagnosis not present

## 2017-02-18 DIAGNOSIS — D509 Iron deficiency anemia, unspecified: Secondary | ICD-10-CM | POA: Diagnosis not present

## 2017-02-18 DIAGNOSIS — N2581 Secondary hyperparathyroidism of renal origin: Secondary | ICD-10-CM | POA: Diagnosis not present

## 2017-02-18 DIAGNOSIS — D631 Anemia in chronic kidney disease: Secondary | ICD-10-CM | POA: Diagnosis not present

## 2017-02-18 DIAGNOSIS — N186 End stage renal disease: Secondary | ICD-10-CM | POA: Diagnosis not present

## 2017-02-21 DIAGNOSIS — D509 Iron deficiency anemia, unspecified: Secondary | ICD-10-CM | POA: Diagnosis not present

## 2017-02-21 DIAGNOSIS — N186 End stage renal disease: Secondary | ICD-10-CM | POA: Diagnosis not present

## 2017-02-21 DIAGNOSIS — N2581 Secondary hyperparathyroidism of renal origin: Secondary | ICD-10-CM | POA: Diagnosis not present

## 2017-02-21 DIAGNOSIS — D631 Anemia in chronic kidney disease: Secondary | ICD-10-CM | POA: Diagnosis not present

## 2017-02-23 DIAGNOSIS — D509 Iron deficiency anemia, unspecified: Secondary | ICD-10-CM | POA: Diagnosis not present

## 2017-02-23 DIAGNOSIS — D631 Anemia in chronic kidney disease: Secondary | ICD-10-CM | POA: Diagnosis not present

## 2017-02-23 DIAGNOSIS — N186 End stage renal disease: Secondary | ICD-10-CM | POA: Diagnosis not present

## 2017-02-23 DIAGNOSIS — N2581 Secondary hyperparathyroidism of renal origin: Secondary | ICD-10-CM | POA: Diagnosis not present

## 2017-02-23 DIAGNOSIS — E119 Type 2 diabetes mellitus without complications: Secondary | ICD-10-CM | POA: Diagnosis not present

## 2017-02-25 DIAGNOSIS — N186 End stage renal disease: Secondary | ICD-10-CM | POA: Diagnosis not present

## 2017-02-25 DIAGNOSIS — D631 Anemia in chronic kidney disease: Secondary | ICD-10-CM | POA: Diagnosis not present

## 2017-02-25 DIAGNOSIS — N2581 Secondary hyperparathyroidism of renal origin: Secondary | ICD-10-CM | POA: Diagnosis not present

## 2017-02-25 DIAGNOSIS — D509 Iron deficiency anemia, unspecified: Secondary | ICD-10-CM | POA: Diagnosis not present

## 2017-02-28 DIAGNOSIS — N186 End stage renal disease: Secondary | ICD-10-CM | POA: Diagnosis not present

## 2017-02-28 DIAGNOSIS — Z992 Dependence on renal dialysis: Secondary | ICD-10-CM | POA: Diagnosis not present

## 2017-02-28 DIAGNOSIS — D631 Anemia in chronic kidney disease: Secondary | ICD-10-CM | POA: Diagnosis not present

## 2017-02-28 DIAGNOSIS — N033 Chronic nephritic syndrome with diffuse mesangial proliferative glomerulonephritis: Secondary | ICD-10-CM | POA: Diagnosis not present

## 2017-02-28 DIAGNOSIS — N2581 Secondary hyperparathyroidism of renal origin: Secondary | ICD-10-CM | POA: Diagnosis not present

## 2017-02-28 DIAGNOSIS — D509 Iron deficiency anemia, unspecified: Secondary | ICD-10-CM | POA: Diagnosis not present

## 2017-03-01 DIAGNOSIS — G5702 Lesion of sciatic nerve, left lower limb: Secondary | ICD-10-CM | POA: Diagnosis not present

## 2017-03-01 DIAGNOSIS — N186 End stage renal disease: Secondary | ICD-10-CM | POA: Diagnosis not present

## 2017-03-01 DIAGNOSIS — M549 Dorsalgia, unspecified: Secondary | ICD-10-CM | POA: Diagnosis not present

## 2017-03-01 DIAGNOSIS — G43819 Other migraine, intractable, without status migrainosus: Secondary | ICD-10-CM | POA: Diagnosis not present

## 2017-03-01 DIAGNOSIS — M5417 Radiculopathy, lumbosacral region: Secondary | ICD-10-CM | POA: Diagnosis not present

## 2017-03-02 DIAGNOSIS — N186 End stage renal disease: Secondary | ICD-10-CM | POA: Diagnosis not present

## 2017-03-02 DIAGNOSIS — D631 Anemia in chronic kidney disease: Secondary | ICD-10-CM | POA: Diagnosis not present

## 2017-03-02 DIAGNOSIS — N2581 Secondary hyperparathyroidism of renal origin: Secondary | ICD-10-CM | POA: Diagnosis not present

## 2017-03-02 DIAGNOSIS — D509 Iron deficiency anemia, unspecified: Secondary | ICD-10-CM | POA: Diagnosis not present

## 2017-03-04 DIAGNOSIS — N186 End stage renal disease: Secondary | ICD-10-CM | POA: Diagnosis not present

## 2017-03-04 DIAGNOSIS — N2581 Secondary hyperparathyroidism of renal origin: Secondary | ICD-10-CM | POA: Diagnosis not present

## 2017-03-04 DIAGNOSIS — D631 Anemia in chronic kidney disease: Secondary | ICD-10-CM | POA: Diagnosis not present

## 2017-03-04 DIAGNOSIS — D509 Iron deficiency anemia, unspecified: Secondary | ICD-10-CM | POA: Diagnosis not present

## 2017-03-07 DIAGNOSIS — D509 Iron deficiency anemia, unspecified: Secondary | ICD-10-CM | POA: Diagnosis not present

## 2017-03-07 DIAGNOSIS — N186 End stage renal disease: Secondary | ICD-10-CM | POA: Diagnosis not present

## 2017-03-07 DIAGNOSIS — D631 Anemia in chronic kidney disease: Secondary | ICD-10-CM | POA: Diagnosis not present

## 2017-03-07 DIAGNOSIS — N2581 Secondary hyperparathyroidism of renal origin: Secondary | ICD-10-CM | POA: Diagnosis not present

## 2017-03-09 DIAGNOSIS — N2581 Secondary hyperparathyroidism of renal origin: Secondary | ICD-10-CM | POA: Diagnosis not present

## 2017-03-09 DIAGNOSIS — N186 End stage renal disease: Secondary | ICD-10-CM | POA: Diagnosis not present

## 2017-03-09 DIAGNOSIS — D509 Iron deficiency anemia, unspecified: Secondary | ICD-10-CM | POA: Diagnosis not present

## 2017-03-09 DIAGNOSIS — D631 Anemia in chronic kidney disease: Secondary | ICD-10-CM | POA: Diagnosis not present

## 2017-03-11 DIAGNOSIS — N2581 Secondary hyperparathyroidism of renal origin: Secondary | ICD-10-CM | POA: Diagnosis not present

## 2017-03-11 DIAGNOSIS — D509 Iron deficiency anemia, unspecified: Secondary | ICD-10-CM | POA: Diagnosis not present

## 2017-03-11 DIAGNOSIS — N186 End stage renal disease: Secondary | ICD-10-CM | POA: Diagnosis not present

## 2017-03-11 DIAGNOSIS — D631 Anemia in chronic kidney disease: Secondary | ICD-10-CM | POA: Diagnosis not present

## 2017-03-15 DIAGNOSIS — D509 Iron deficiency anemia, unspecified: Secondary | ICD-10-CM | POA: Diagnosis not present

## 2017-03-15 DIAGNOSIS — N186 End stage renal disease: Secondary | ICD-10-CM | POA: Diagnosis not present

## 2017-03-15 DIAGNOSIS — N2581 Secondary hyperparathyroidism of renal origin: Secondary | ICD-10-CM | POA: Diagnosis not present

## 2017-03-15 DIAGNOSIS — D631 Anemia in chronic kidney disease: Secondary | ICD-10-CM | POA: Diagnosis not present

## 2017-03-16 DIAGNOSIS — N2581 Secondary hyperparathyroidism of renal origin: Secondary | ICD-10-CM | POA: Diagnosis not present

## 2017-03-16 DIAGNOSIS — N186 End stage renal disease: Secondary | ICD-10-CM | POA: Diagnosis not present

## 2017-03-16 DIAGNOSIS — D631 Anemia in chronic kidney disease: Secondary | ICD-10-CM | POA: Diagnosis not present

## 2017-03-16 DIAGNOSIS — D509 Iron deficiency anemia, unspecified: Secondary | ICD-10-CM | POA: Diagnosis not present

## 2017-03-18 DIAGNOSIS — D509 Iron deficiency anemia, unspecified: Secondary | ICD-10-CM | POA: Diagnosis not present

## 2017-03-18 DIAGNOSIS — N2581 Secondary hyperparathyroidism of renal origin: Secondary | ICD-10-CM | POA: Diagnosis not present

## 2017-03-18 DIAGNOSIS — N186 End stage renal disease: Secondary | ICD-10-CM | POA: Diagnosis not present

## 2017-03-18 DIAGNOSIS — D631 Anemia in chronic kidney disease: Secondary | ICD-10-CM | POA: Diagnosis not present

## 2017-03-21 DIAGNOSIS — N2581 Secondary hyperparathyroidism of renal origin: Secondary | ICD-10-CM | POA: Diagnosis not present

## 2017-03-21 DIAGNOSIS — N186 End stage renal disease: Secondary | ICD-10-CM | POA: Diagnosis not present

## 2017-03-21 DIAGNOSIS — D509 Iron deficiency anemia, unspecified: Secondary | ICD-10-CM | POA: Diagnosis not present

## 2017-03-21 DIAGNOSIS — D631 Anemia in chronic kidney disease: Secondary | ICD-10-CM | POA: Diagnosis not present

## 2017-03-23 DIAGNOSIS — D509 Iron deficiency anemia, unspecified: Secondary | ICD-10-CM | POA: Diagnosis not present

## 2017-03-23 DIAGNOSIS — D631 Anemia in chronic kidney disease: Secondary | ICD-10-CM | POA: Diagnosis not present

## 2017-03-23 DIAGNOSIS — N186 End stage renal disease: Secondary | ICD-10-CM | POA: Diagnosis not present

## 2017-03-23 DIAGNOSIS — N2581 Secondary hyperparathyroidism of renal origin: Secondary | ICD-10-CM | POA: Diagnosis not present

## 2017-03-29 DIAGNOSIS — N186 End stage renal disease: Secondary | ICD-10-CM | POA: Diagnosis not present

## 2017-03-29 DIAGNOSIS — D631 Anemia in chronic kidney disease: Secondary | ICD-10-CM | POA: Diagnosis not present

## 2017-03-29 DIAGNOSIS — D509 Iron deficiency anemia, unspecified: Secondary | ICD-10-CM | POA: Diagnosis not present

## 2017-03-29 DIAGNOSIS — N2581 Secondary hyperparathyroidism of renal origin: Secondary | ICD-10-CM | POA: Diagnosis not present

## 2017-03-30 DIAGNOSIS — N186 End stage renal disease: Secondary | ICD-10-CM | POA: Diagnosis not present

## 2017-03-30 DIAGNOSIS — N2581 Secondary hyperparathyroidism of renal origin: Secondary | ICD-10-CM | POA: Diagnosis not present

## 2017-03-30 DIAGNOSIS — D631 Anemia in chronic kidney disease: Secondary | ICD-10-CM | POA: Diagnosis not present

## 2017-03-30 DIAGNOSIS — D509 Iron deficiency anemia, unspecified: Secondary | ICD-10-CM | POA: Diagnosis not present

## 2017-03-31 DIAGNOSIS — Z992 Dependence on renal dialysis: Secondary | ICD-10-CM | POA: Diagnosis not present

## 2017-03-31 DIAGNOSIS — N186 End stage renal disease: Secondary | ICD-10-CM | POA: Diagnosis not present

## 2017-03-31 DIAGNOSIS — N033 Chronic nephritic syndrome with diffuse mesangial proliferative glomerulonephritis: Secondary | ICD-10-CM | POA: Diagnosis not present

## 2017-04-01 DIAGNOSIS — N186 End stage renal disease: Secondary | ICD-10-CM | POA: Diagnosis not present

## 2017-04-01 DIAGNOSIS — D509 Iron deficiency anemia, unspecified: Secondary | ICD-10-CM | POA: Diagnosis not present

## 2017-04-01 DIAGNOSIS — N2581 Secondary hyperparathyroidism of renal origin: Secondary | ICD-10-CM | POA: Diagnosis not present

## 2017-04-04 DIAGNOSIS — D509 Iron deficiency anemia, unspecified: Secondary | ICD-10-CM | POA: Diagnosis not present

## 2017-04-04 DIAGNOSIS — N2581 Secondary hyperparathyroidism of renal origin: Secondary | ICD-10-CM | POA: Diagnosis not present

## 2017-04-04 DIAGNOSIS — N186 End stage renal disease: Secondary | ICD-10-CM | POA: Diagnosis not present

## 2017-04-08 DIAGNOSIS — N186 End stage renal disease: Secondary | ICD-10-CM | POA: Diagnosis not present

## 2017-04-08 DIAGNOSIS — N2581 Secondary hyperparathyroidism of renal origin: Secondary | ICD-10-CM | POA: Diagnosis not present

## 2017-04-08 DIAGNOSIS — D509 Iron deficiency anemia, unspecified: Secondary | ICD-10-CM | POA: Diagnosis not present

## 2017-04-11 DIAGNOSIS — N2581 Secondary hyperparathyroidism of renal origin: Secondary | ICD-10-CM | POA: Diagnosis not present

## 2017-04-11 DIAGNOSIS — N186 End stage renal disease: Secondary | ICD-10-CM | POA: Diagnosis not present

## 2017-04-11 DIAGNOSIS — D509 Iron deficiency anemia, unspecified: Secondary | ICD-10-CM | POA: Diagnosis not present

## 2017-04-13 DIAGNOSIS — N186 End stage renal disease: Secondary | ICD-10-CM | POA: Diagnosis not present

## 2017-04-13 DIAGNOSIS — N2581 Secondary hyperparathyroidism of renal origin: Secondary | ICD-10-CM | POA: Diagnosis not present

## 2017-04-13 DIAGNOSIS — D509 Iron deficiency anemia, unspecified: Secondary | ICD-10-CM | POA: Diagnosis not present

## 2017-04-15 DIAGNOSIS — N186 End stage renal disease: Secondary | ICD-10-CM | POA: Diagnosis not present

## 2017-04-15 DIAGNOSIS — N2581 Secondary hyperparathyroidism of renal origin: Secondary | ICD-10-CM | POA: Diagnosis not present

## 2017-04-15 DIAGNOSIS — D509 Iron deficiency anemia, unspecified: Secondary | ICD-10-CM | POA: Diagnosis not present

## 2017-04-18 DIAGNOSIS — N186 End stage renal disease: Secondary | ICD-10-CM | POA: Diagnosis not present

## 2017-04-18 DIAGNOSIS — N2581 Secondary hyperparathyroidism of renal origin: Secondary | ICD-10-CM | POA: Diagnosis not present

## 2017-04-18 DIAGNOSIS — D509 Iron deficiency anemia, unspecified: Secondary | ICD-10-CM | POA: Diagnosis not present

## 2017-04-20 DIAGNOSIS — N186 End stage renal disease: Secondary | ICD-10-CM | POA: Diagnosis not present

## 2017-04-20 DIAGNOSIS — N2581 Secondary hyperparathyroidism of renal origin: Secondary | ICD-10-CM | POA: Diagnosis not present

## 2017-04-20 DIAGNOSIS — D509 Iron deficiency anemia, unspecified: Secondary | ICD-10-CM | POA: Diagnosis not present

## 2017-04-25 DIAGNOSIS — D509 Iron deficiency anemia, unspecified: Secondary | ICD-10-CM | POA: Diagnosis not present

## 2017-04-25 DIAGNOSIS — N186 End stage renal disease: Secondary | ICD-10-CM | POA: Diagnosis not present

## 2017-04-25 DIAGNOSIS — N2581 Secondary hyperparathyroidism of renal origin: Secondary | ICD-10-CM | POA: Diagnosis not present

## 2017-04-27 DIAGNOSIS — N186 End stage renal disease: Secondary | ICD-10-CM | POA: Diagnosis not present

## 2017-04-27 DIAGNOSIS — D509 Iron deficiency anemia, unspecified: Secondary | ICD-10-CM | POA: Diagnosis not present

## 2017-04-27 DIAGNOSIS — N2581 Secondary hyperparathyroidism of renal origin: Secondary | ICD-10-CM | POA: Diagnosis not present

## 2017-04-28 DIAGNOSIS — N186 End stage renal disease: Secondary | ICD-10-CM | POA: Diagnosis not present

## 2017-04-28 DIAGNOSIS — M549 Dorsalgia, unspecified: Secondary | ICD-10-CM | POA: Diagnosis not present

## 2017-04-28 DIAGNOSIS — G5702 Lesion of sciatic nerve, left lower limb: Secondary | ICD-10-CM | POA: Diagnosis not present

## 2017-04-28 DIAGNOSIS — M5417 Radiculopathy, lumbosacral region: Secondary | ICD-10-CM | POA: Diagnosis not present

## 2017-04-28 DIAGNOSIS — D689 Coagulation defect, unspecified: Secondary | ICD-10-CM | POA: Diagnosis not present

## 2017-04-29 DIAGNOSIS — N2581 Secondary hyperparathyroidism of renal origin: Secondary | ICD-10-CM | POA: Diagnosis not present

## 2017-04-29 DIAGNOSIS — D509 Iron deficiency anemia, unspecified: Secondary | ICD-10-CM | POA: Diagnosis not present

## 2017-04-29 DIAGNOSIS — N186 End stage renal disease: Secondary | ICD-10-CM | POA: Diagnosis not present

## 2017-04-30 DIAGNOSIS — N186 End stage renal disease: Secondary | ICD-10-CM | POA: Diagnosis not present

## 2017-04-30 DIAGNOSIS — N033 Chronic nephritic syndrome with diffuse mesangial proliferative glomerulonephritis: Secondary | ICD-10-CM | POA: Diagnosis not present

## 2017-04-30 DIAGNOSIS — Z992 Dependence on renal dialysis: Secondary | ICD-10-CM | POA: Diagnosis not present

## 2017-05-06 DIAGNOSIS — N2581 Secondary hyperparathyroidism of renal origin: Secondary | ICD-10-CM | POA: Diagnosis not present

## 2017-05-06 DIAGNOSIS — D631 Anemia in chronic kidney disease: Secondary | ICD-10-CM | POA: Diagnosis not present

## 2017-05-06 DIAGNOSIS — N186 End stage renal disease: Secondary | ICD-10-CM | POA: Diagnosis not present

## 2017-05-06 DIAGNOSIS — D509 Iron deficiency anemia, unspecified: Secondary | ICD-10-CM | POA: Diagnosis not present

## 2017-05-09 DIAGNOSIS — D631 Anemia in chronic kidney disease: Secondary | ICD-10-CM | POA: Diagnosis not present

## 2017-05-09 DIAGNOSIS — N186 End stage renal disease: Secondary | ICD-10-CM | POA: Diagnosis not present

## 2017-05-09 DIAGNOSIS — N2581 Secondary hyperparathyroidism of renal origin: Secondary | ICD-10-CM | POA: Diagnosis not present

## 2017-05-09 DIAGNOSIS — D509 Iron deficiency anemia, unspecified: Secondary | ICD-10-CM | POA: Diagnosis not present

## 2017-05-10 DIAGNOSIS — Z881 Allergy status to other antibiotic agents status: Secondary | ICD-10-CM | POA: Diagnosis not present

## 2017-05-10 DIAGNOSIS — Z886 Allergy status to analgesic agent status: Secondary | ICD-10-CM | POA: Diagnosis not present

## 2017-05-10 DIAGNOSIS — Z885 Allergy status to narcotic agent status: Secondary | ICD-10-CM | POA: Diagnosis not present

## 2017-05-10 DIAGNOSIS — I120 Hypertensive chronic kidney disease with stage 5 chronic kidney disease or end stage renal disease: Secondary | ICD-10-CM | POA: Diagnosis not present

## 2017-05-10 DIAGNOSIS — Z992 Dependence on renal dialysis: Secondary | ICD-10-CM | POA: Diagnosis not present

## 2017-05-10 DIAGNOSIS — Z87891 Personal history of nicotine dependence: Secondary | ICD-10-CM | POA: Diagnosis not present

## 2017-05-10 DIAGNOSIS — J45909 Unspecified asthma, uncomplicated: Secondary | ICD-10-CM | POA: Diagnosis not present

## 2017-05-10 DIAGNOSIS — N185 Chronic kidney disease, stage 5: Secondary | ICD-10-CM | POA: Diagnosis not present

## 2017-05-10 DIAGNOSIS — M5417 Radiculopathy, lumbosacral region: Secondary | ICD-10-CM | POA: Diagnosis not present

## 2017-05-10 DIAGNOSIS — G894 Chronic pain syndrome: Secondary | ICD-10-CM | POA: Diagnosis not present

## 2017-05-10 DIAGNOSIS — D631 Anemia in chronic kidney disease: Secondary | ICD-10-CM | POA: Diagnosis not present

## 2017-05-10 DIAGNOSIS — Z888 Allergy status to other drugs, medicaments and biological substances status: Secondary | ICD-10-CM | POA: Diagnosis not present

## 2017-05-11 DIAGNOSIS — D631 Anemia in chronic kidney disease: Secondary | ICD-10-CM | POA: Diagnosis not present

## 2017-05-11 DIAGNOSIS — N186 End stage renal disease: Secondary | ICD-10-CM | POA: Diagnosis not present

## 2017-05-11 DIAGNOSIS — D509 Iron deficiency anemia, unspecified: Secondary | ICD-10-CM | POA: Diagnosis not present

## 2017-05-11 DIAGNOSIS — N2581 Secondary hyperparathyroidism of renal origin: Secondary | ICD-10-CM | POA: Diagnosis not present

## 2017-05-13 DIAGNOSIS — D509 Iron deficiency anemia, unspecified: Secondary | ICD-10-CM | POA: Diagnosis not present

## 2017-05-13 DIAGNOSIS — N2581 Secondary hyperparathyroidism of renal origin: Secondary | ICD-10-CM | POA: Diagnosis not present

## 2017-05-13 DIAGNOSIS — D631 Anemia in chronic kidney disease: Secondary | ICD-10-CM | POA: Diagnosis not present

## 2017-05-13 DIAGNOSIS — N186 End stage renal disease: Secondary | ICD-10-CM | POA: Diagnosis not present

## 2017-05-16 DIAGNOSIS — D631 Anemia in chronic kidney disease: Secondary | ICD-10-CM | POA: Diagnosis not present

## 2017-05-16 DIAGNOSIS — N2581 Secondary hyperparathyroidism of renal origin: Secondary | ICD-10-CM | POA: Diagnosis not present

## 2017-05-16 DIAGNOSIS — D509 Iron deficiency anemia, unspecified: Secondary | ICD-10-CM | POA: Diagnosis not present

## 2017-05-16 DIAGNOSIS — N186 End stage renal disease: Secondary | ICD-10-CM | POA: Diagnosis not present

## 2017-05-18 DIAGNOSIS — D509 Iron deficiency anemia, unspecified: Secondary | ICD-10-CM | POA: Diagnosis not present

## 2017-05-18 DIAGNOSIS — D631 Anemia in chronic kidney disease: Secondary | ICD-10-CM | POA: Diagnosis not present

## 2017-05-18 DIAGNOSIS — N2581 Secondary hyperparathyroidism of renal origin: Secondary | ICD-10-CM | POA: Diagnosis not present

## 2017-05-18 DIAGNOSIS — N186 End stage renal disease: Secondary | ICD-10-CM | POA: Diagnosis not present

## 2017-05-19 DIAGNOSIS — G43009 Migraine without aura, not intractable, without status migrainosus: Secondary | ICD-10-CM | POA: Diagnosis not present

## 2017-05-19 DIAGNOSIS — J449 Chronic obstructive pulmonary disease, unspecified: Secondary | ICD-10-CM | POA: Diagnosis not present

## 2017-05-19 DIAGNOSIS — F329 Major depressive disorder, single episode, unspecified: Secondary | ICD-10-CM | POA: Diagnosis not present

## 2017-05-19 DIAGNOSIS — I10 Essential (primary) hypertension: Secondary | ICD-10-CM | POA: Diagnosis not present

## 2017-05-20 DIAGNOSIS — D631 Anemia in chronic kidney disease: Secondary | ICD-10-CM | POA: Diagnosis not present

## 2017-05-20 DIAGNOSIS — N2581 Secondary hyperparathyroidism of renal origin: Secondary | ICD-10-CM | POA: Diagnosis not present

## 2017-05-20 DIAGNOSIS — N186 End stage renal disease: Secondary | ICD-10-CM | POA: Diagnosis not present

## 2017-05-20 DIAGNOSIS — D509 Iron deficiency anemia, unspecified: Secondary | ICD-10-CM | POA: Diagnosis not present

## 2017-05-23 DIAGNOSIS — D631 Anemia in chronic kidney disease: Secondary | ICD-10-CM | POA: Diagnosis not present

## 2017-05-23 DIAGNOSIS — N186 End stage renal disease: Secondary | ICD-10-CM | POA: Diagnosis not present

## 2017-05-23 DIAGNOSIS — D509 Iron deficiency anemia, unspecified: Secondary | ICD-10-CM | POA: Diagnosis not present

## 2017-05-23 DIAGNOSIS — N2581 Secondary hyperparathyroidism of renal origin: Secondary | ICD-10-CM | POA: Diagnosis not present

## 2017-05-25 DIAGNOSIS — N2581 Secondary hyperparathyroidism of renal origin: Secondary | ICD-10-CM | POA: Diagnosis not present

## 2017-05-25 DIAGNOSIS — D509 Iron deficiency anemia, unspecified: Secondary | ICD-10-CM | POA: Diagnosis not present

## 2017-05-25 DIAGNOSIS — E119 Type 2 diabetes mellitus without complications: Secondary | ICD-10-CM | POA: Diagnosis not present

## 2017-05-25 DIAGNOSIS — D631 Anemia in chronic kidney disease: Secondary | ICD-10-CM | POA: Diagnosis not present

## 2017-05-25 DIAGNOSIS — N186 End stage renal disease: Secondary | ICD-10-CM | POA: Diagnosis not present

## 2017-05-28 DIAGNOSIS — N2581 Secondary hyperparathyroidism of renal origin: Secondary | ICD-10-CM | POA: Diagnosis not present

## 2017-05-28 DIAGNOSIS — N186 End stage renal disease: Secondary | ICD-10-CM | POA: Diagnosis not present

## 2017-05-28 DIAGNOSIS — D631 Anemia in chronic kidney disease: Secondary | ICD-10-CM | POA: Diagnosis not present

## 2017-05-28 DIAGNOSIS — D509 Iron deficiency anemia, unspecified: Secondary | ICD-10-CM | POA: Diagnosis not present

## 2017-05-31 DIAGNOSIS — S93409A Sprain of unspecified ligament of unspecified ankle, initial encounter: Secondary | ICD-10-CM | POA: Diagnosis not present

## 2017-05-31 DIAGNOSIS — M25562 Pain in left knee: Secondary | ICD-10-CM | POA: Diagnosis not present

## 2017-05-31 DIAGNOSIS — N186 End stage renal disease: Secondary | ICD-10-CM | POA: Diagnosis not present

## 2017-05-31 DIAGNOSIS — S93402A Sprain of unspecified ligament of left ankle, initial encounter: Secondary | ICD-10-CM | POA: Diagnosis not present

## 2017-05-31 DIAGNOSIS — Z992 Dependence on renal dialysis: Secondary | ICD-10-CM | POA: Diagnosis not present

## 2017-05-31 DIAGNOSIS — R2242 Localized swelling, mass and lump, left lower limb: Secondary | ICD-10-CM | POA: Diagnosis not present

## 2017-05-31 DIAGNOSIS — N033 Chronic nephritic syndrome with diffuse mesangial proliferative glomerulonephritis: Secondary | ICD-10-CM | POA: Diagnosis not present

## 2017-05-31 DIAGNOSIS — M7989 Other specified soft tissue disorders: Secondary | ICD-10-CM | POA: Diagnosis not present

## 2017-05-31 DIAGNOSIS — S8992XA Unspecified injury of left lower leg, initial encounter: Secondary | ICD-10-CM | POA: Diagnosis not present

## 2017-05-31 DIAGNOSIS — M79662 Pain in left lower leg: Secondary | ICD-10-CM | POA: Diagnosis not present

## 2017-05-31 DIAGNOSIS — M25572 Pain in left ankle and joints of left foot: Secondary | ICD-10-CM | POA: Diagnosis not present

## 2017-06-03 DIAGNOSIS — N2581 Secondary hyperparathyroidism of renal origin: Secondary | ICD-10-CM | POA: Diagnosis not present

## 2017-06-03 DIAGNOSIS — M65062 Abscess of tendon sheath, left lower leg: Secondary | ICD-10-CM | POA: Diagnosis not present

## 2017-06-03 DIAGNOSIS — N186 End stage renal disease: Secondary | ICD-10-CM | POA: Diagnosis not present

## 2017-06-03 DIAGNOSIS — D509 Iron deficiency anemia, unspecified: Secondary | ICD-10-CM | POA: Diagnosis not present

## 2017-06-05 ENCOUNTER — Emergency Department (HOSPITAL_BASED_OUTPATIENT_CLINIC_OR_DEPARTMENT_OTHER): Admit: 2017-06-05 | Discharge: 2017-06-05 | Disposition: A | Discharge status: Home / Self Care | Payer: Medicare Other

## 2017-06-05 ENCOUNTER — Emergency Department (HOSPITAL_COMMUNITY)
Admission: EM | Admit: 2017-06-05 | Discharge: 2017-06-05 | Disposition: A | Discharge status: Home / Self Care | Payer: Medicare Other | Source: Home / Self Care | Attending: Emergency Medicine | Admitting: Emergency Medicine

## 2017-06-05 ENCOUNTER — Emergency Department (HOSPITAL_COMMUNITY): Payer: Medicare Other

## 2017-06-05 ENCOUNTER — Encounter (HOSPITAL_COMMUNITY): Payer: Self-pay | Admitting: *Deleted

## 2017-06-05 DIAGNOSIS — I120 Hypertensive chronic kidney disease with stage 5 chronic kidney disease or end stage renal disease: Secondary | ICD-10-CM

## 2017-06-05 DIAGNOSIS — F17210 Nicotine dependence, cigarettes, uncomplicated: Secondary | ICD-10-CM

## 2017-06-05 DIAGNOSIS — J45909 Unspecified asthma, uncomplicated: Secondary | ICD-10-CM

## 2017-06-05 DIAGNOSIS — F329 Major depressive disorder, single episode, unspecified: Secondary | ICD-10-CM

## 2017-06-05 DIAGNOSIS — N186 End stage renal disease: Secondary | ICD-10-CM

## 2017-06-05 DIAGNOSIS — R011 Cardiac murmur, unspecified: Secondary | ICD-10-CM | POA: Diagnosis not present

## 2017-06-05 DIAGNOSIS — M79609 Pain in unspecified limb: Secondary | ICD-10-CM

## 2017-06-05 DIAGNOSIS — L03116 Cellulitis of left lower limb: Secondary | ICD-10-CM

## 2017-06-05 DIAGNOSIS — Z992 Dependence on renal dialysis: Secondary | ICD-10-CM

## 2017-06-05 DIAGNOSIS — F419 Anxiety disorder, unspecified: Secondary | ICD-10-CM | POA: Insufficient documentation

## 2017-06-05 DIAGNOSIS — M7989 Other specified soft tissue disorders: Secondary | ICD-10-CM | POA: Diagnosis not present

## 2017-06-05 DIAGNOSIS — A419 Sepsis, unspecified organism: Secondary | ICD-10-CM | POA: Diagnosis not present

## 2017-06-05 DIAGNOSIS — N2581 Secondary hyperparathyroidism of renal origin: Secondary | ICD-10-CM | POA: Diagnosis not present

## 2017-06-05 DIAGNOSIS — M25572 Pain in left ankle and joints of left foot: Secondary | ICD-10-CM | POA: Diagnosis not present

## 2017-06-05 DIAGNOSIS — Z79899 Other long term (current) drug therapy: Secondary | ICD-10-CM | POA: Insufficient documentation

## 2017-06-05 DIAGNOSIS — I889 Nonspecific lymphadenitis, unspecified: Secondary | ICD-10-CM

## 2017-06-05 DIAGNOSIS — Q600 Renal agenesis, unilateral: Secondary | ICD-10-CM | POA: Diagnosis not present

## 2017-06-05 LAB — CBC WITH DIFFERENTIAL/PLATELET
BASOS ABS: 0 10*3/uL (ref 0.0–0.1)
BASOS PCT: 0 %
Eosinophils Absolute: 0 10*3/uL (ref 0.0–0.7)
Eosinophils Relative: 0 %
HEMATOCRIT: 24.2 % — AB (ref 36.0–46.0)
HEMOGLOBIN: 7.5 g/dL — AB (ref 12.0–15.0)
Lymphocytes Relative: 16 %
Lymphs Abs: 1.6 10*3/uL (ref 0.7–4.0)
MCH: 30.4 pg (ref 26.0–34.0)
MCHC: 31 g/dL (ref 30.0–36.0)
MCV: 98 fL (ref 78.0–100.0)
MONOS PCT: 7 %
Monocytes Absolute: 0.7 10*3/uL (ref 0.1–1.0)
NEUTROS ABS: 7.9 10*3/uL — AB (ref 1.7–7.7)
NEUTROS PCT: 77 %
Platelets: 235 10*3/uL (ref 150–400)
RBC: 2.47 MIL/uL — ABNORMAL LOW (ref 3.87–5.11)
RDW: 16.6 % — ABNORMAL HIGH (ref 11.5–15.5)
WBC: 10.2 10*3/uL (ref 4.0–10.5)

## 2017-06-05 LAB — BASIC METABOLIC PANEL
ANION GAP: 15 (ref 5–15)
BUN: 40 mg/dL — ABNORMAL HIGH (ref 6–20)
CALCIUM: 9.2 mg/dL (ref 8.9–10.3)
CO2: 26 mmol/L (ref 22–32)
Chloride: 97 mmol/L — ABNORMAL LOW (ref 101–111)
Creatinine, Ser: 8.13 mg/dL — ABNORMAL HIGH (ref 0.44–1.00)
GFR calc non Af Amer: 5 mL/min — ABNORMAL LOW (ref >60)
GFR, EST AFRICAN AMERICAN: 6 mL/min — AB (ref >60)
Glucose, Bld: 111 mg/dL — ABNORMAL HIGH (ref 65–99)
Potassium: 4.2 mmol/L (ref 3.5–5.1)
Sodium: 138 mmol/L (ref 135–145)

## 2017-06-05 MED ORDER — SULFAMETHOXAZOLE-TRIMETHOPRIM 800-160 MG PO TABS
1.0000 | ORAL_TABLET | Freq: Once | ORAL | Status: AC
  Administered 2017-06-05: 1 via ORAL
  Filled 2017-06-05: qty 1

## 2017-06-05 MED ORDER — CEPHALEXIN 500 MG PO CAPS: 500.0000 mg | ORAL_CAPSULE | Freq: Two times a day (BID) | ORAL | 0 refills | Status: DC

## 2017-06-05 MED ORDER — HYDROMORPHONE HCL 1 MG/ML IJ SOLN
1.0000 mg | Freq: Once | INTRAMUSCULAR | Status: AC
  Administered 2017-06-05: 1 mg via INTRAVENOUS
  Filled 2017-06-05: qty 1

## 2017-06-05 MED ORDER — CEPHALEXIN 250 MG PO CAPS
500.0000 mg | ORAL_CAPSULE | Freq: Once | ORAL | Status: AC
  Administered 2017-06-05: 500 mg via ORAL
  Filled 2017-06-05: qty 2

## 2017-06-05 NOTE — ED Provider Notes (Signed)
[facility] DEPT Provider Note   CSN: [id number] Arrival date & time: 06/05/17  0708     History   Chief Complaint Chief Complaint  Patient presents with  . Leg Pain    HPI [name] is a 46 y.o. female.  Patient is a 46 year old female with a history of end-stage renal disease on dialysis Monday Wednesday Friday. She did complete her dialysis on Friday. She has a two-week history of worsening pain and swelling to her left leg. She states it started with some pain in her calf about 2 weeks ago and has progressed. She states she did not have any initial injury but due to the pain she's fallen a couple times since it started. She has pain and swelling to her left foot ankle and lower leg. She states it's red and swollen. She denies any known fevers although she's felt hot from time to time. She has occasional shortness of breath but no persistent shortness of breath. No chest pain. No prior history of blood clots.      Past Medical History:  Diagnosis Date  . ADD (attention deficit disorder)   . Anemia   . Anemia   . Anxiety   . Arthritis    knee, wrist, back   . Asthma   . Blood transfusion 06/2011   6 units transfused at [facility]  . Bronchitis   . Depression   . Dizziness   . ESRD (end stage renal disease) ([place])    Hemodialysis 3 times a  week - [place]  . Gastritis   . GERD (gastroesophageal reflux disease)   . Gout   . Headache(784.0)   . Hypertension    sees Dr. Wendie Agreste  . Neuromuscular disorder (HCC)    carpal tunnel  . Obesity   . Reflux   . Renal insufficiency   . Shortness of breath   . Sleep apnea    does not use CPAP    Patient Active Problem List   Diagnosis Date Noted  . Sepsis ([place]) 02/15/2014  . Cellulitis 02/14/2014  . Thinning of skin 04/09/2013  . Mechanical complication of other vascular device, implant, and graft 04/09/2013  . Pain, limb, left 10/18/2012  . Swollen 10/18/2012  . Status post endometrial ablation  12/10/2011  . End stage renal disease ([place]) 11/24/2011  . Renal failure (ARF), acute on chronic (HCC) 10/19/2011  . HTN (hypertension) 10/19/2011  . Anemia 10/19/2011    Past Surgical History:  Procedure Laterality Date  . AV FISTULA PLACEMENT  06/10/11   Left brachiocephalic AVF  . AV Fistula surgery  11/30/11   [facility] - redo fistula  . Carpel tunnel release     x 2; bilateral  . CESAREAN SECTION    . colonscopy    . DILATION AND CURETTAGE OF UTERUS    . HYSTEROSCOPY WITH THERMACHOICE  12/10/2011   Procedure: HYSTEROSCOPY WITH THERMACHOICE;  Surgeon: Betsy Coder, MD;  Location: [facility];  Service: Gynecology;  Laterality: N/A;  . IRRIGATION AND DEBRIDEMENT ABSCESS Left 02/15/2014   Procedure: IRRIGATION AND DRAINAGE OF LOW BACK ABSCESS;  Surgeon: Imogene Burn. Georgette Dover, MD;  Location: [facility];  Service: General;  Laterality: Left;  . microdiskectomy  04/2001   Herniated disk L5-S1  . RENAL BIOPSY  04/2004  . REVISON OF ARTERIOVENOUS FISTULA Left 01/04/2017   Procedure: REVISON OF ARTERIOVENOUS FISTULA; CEPHALIC VEIN TURNDOWN;  Surgeon: Angelia Mould, MD;  Location: [facility];  Service: Vascular;  Laterality: Left;  .  SHUNTOGRAM  Dec. 18, 2013   Left arm  . SHUNTOGRAM N/A 10/18/2012   Procedure: Earney Mallet;  Surgeon: Conrad Ozora, MD;  Location: [facility] CATH LAB;  Service: Cardiovascular;  Laterality: N/A;  . SHUNTOGRAM Left 04/18/2013   Procedure: Earney Mallet;  Surgeon: Serafina Mitchell, MD;  Location: [facility] CATH LAB;  Service: Cardiovascular;  Laterality: Left;  . SPINE SURGERY  2004   Lumbar diskectomy   . UPPER GASTROINTESTINAL ENDOSCOPY    . WISDOM TOOTH EXTRACTION      OB History    No data available       Home Medications    Prior to Admission medications   Medication Sig Start Date End Date Taking? Authorizing Provider  albuterol (PROVENTIL HFA;VENTOLIN HFA) 108 (90 BASE) MCG/ACT inhaler Inhale 2 puffs into the lungs every 6 (six) hours as needed for shortness of breath.      [provider]  ALPRAZolam Duanne Moron) 1 MG tablet Take 1 mg by mouth 3 (three) times daily.    [provider]  amLODipine (NORVASC) 10 MG tablet Take 10 mg by mouth at bedtime.  02/08/14   [provider]  b complex vitamins tablet Take 1 tablet by mouth daily.    [provider]  budesonide-formoterol (SYMBICORT) 160-4.5 MCG/ACT inhaler Inhale 2 puffs into the lungs 2 (two) times daily.    [provider]  calcium acetate (PHOSLO) 667 MG capsule Take 2,001 mg by mouth 2 (two) times daily with a meal.    [provider]  cephALEXin (KEFLEX) 500 MG capsule Take 1 capsule (500 mg total) by mouth 2 (two) times daily. 06/05/17   Malvin Johns, MD  cyclobenzaprine (FLEXERIL) 10 MG tablet Take 20 mg by mouth at bedtime.     [provider]  furosemide (LASIX) 80 MG tablet Take 80 mg by mouth 2 (two) times daily.     [provider]  hydrALAZINE (APRESOLINE) 50 MG tablet Take 50 mg by mouth 3 (three) times daily.    [provider]  HYDROmorphone (DILAUDID) 2 MG tablet Take 2 mg by mouth every 12 (twelve) hours as needed for severe pain.    [provider]  lidocaine-prilocaine (EMLA) cream Apply 1 application topically See admin instructions. Apply topically one hour prior to dialysis - Monday, Wednesday, Friday    [provider]  lisinopril (PRINIVIL,ZESTRIL) 40 MG tablet Take 40 mg by mouth daily.    [provider]  Melatonin 5 MG TABS Take 5 mg by mouth at bedtime.     [provider]  methocarbamol (ROBAXIN) 500 MG tablet Take 500 mg by mouth 2 (two) times daily.  03/22/13   [provider]  montelukast (SINGULAIR) 10 MG tablet Take 10 mg by mouth daily.     [provider]  multivitamin (RENA-VIT) TABS tablet Take 1 tablet by mouth daily.    [provider]  oxyCODONE (OXYCONTIN) 40 mg 12 hr tablet Take 40 mg by mouth every 12 (twelve) hours.    [provider]  promethazine (PHENERGAN) 25 MG tablet Take 25 mg by mouth every 6 (six) hours as needed for nausea.     [provider]  sevelamer carbonate (RENVELA) 800 MG tablet Take 2,400 mg by mouth 2 (two) times daily with a meal.     [provider]  SUMAtriptan (IMITREX) 50 MG tablet Take 50 mg by mouth daily as needed for migraine or headache.  02/14/14   [provider]  Family History Family History  Problem Relation Age of Onset  . Hypertension Mother   . Heart disease Father        Heart Disease before age 87  . Hypertension Father   . Heart attack Father   . Hypertension Daughter     Social History Social History  Substance Use Topics  . Smoking status: Current Every Day Smoker    Packs/day: 0.50    Years: 18.00    Types: Cigarettes  . Smokeless tobacco: Never Used  . Alcohol use Yes     Comment: ocassional     Allergies   Nsaids; Clarithromycin; Demerol; Fentanyl; Morphine and related; Other; Vancomycin; and Clindamycin/lincomycin   Review of Systems Review of Systems  Constitutional: Positive for chills. Negative for diaphoresis, fatigue and fever.  HENT: Negative for congestion, rhinorrhea and sneezing.   Eyes: Negative.   Respiratory: Negative for cough, chest tightness and shortness of breath.   Cardiovascular: Positive for leg swelling. Negative for chest pain.  Gastrointestinal: Negative for abdominal pain, blood in stool, diarrhea, nausea and vomiting.  Genitourinary: Negative for difficulty urinating, flank pain, frequency and hematuria.  Musculoskeletal: Positive for myalgias. Negative for arthralgias and back pain.  Skin: Negative for rash.  Neurological: Negative for dizziness, speech difficulty, weakness, numbness and headaches.     Physical Exam Updated Vital Signs BP 118/71   Pulse 92   Temp 98.2 F (36.8 C) (Oral)   Resp 18   SpO2 98%   Physical Exam  Constitutional: She is oriented to person, place,  and time. She appears well-developed and well-nourished.  HENT:  Head: Normocephalic and atraumatic.  Eyes: Pupils are equal, round, and reactive to light.  Neck: Normal range of motion. Neck supple.  Cardiovascular: Normal rate, regular rhythm and normal heart sounds.   Pulmonary/Chest: Effort normal and breath sounds normal. No respiratory distress. She has no wheezes. She has no rales. She exhibits no tenderness.  Abdominal: Soft. Bowel sounds are normal. There is no tenderness. There is no rebound and no guarding.  Musculoskeletal: Normal range of motion. She exhibits edema.  Positive swelling of the left leg up to the knee. There is warmth and erythema, particularly over the ankle and foot. No streaking up the leg. There is generalized tenderness to the leg. Pedal pulses are intact. She has normal sensation in the foot. No wounds are noted.  Lymphadenopathy:    She has no cervical adenopathy.  Neurological: She is alert and oriented to person, place, and time.  Skin: Skin is warm and dry. No rash noted.  Psychiatric: She has a normal mood and affect.     ED Treatments / Results  Labs (all labs ordered are listed, but only abnormal results are displayed) Labs Reviewed  BASIC METABOLIC PANEL - Abnormal; Notable for the following:       Result Value   Chloride 97 (*)    Glucose, Bld 111 (*)    BUN 40 (*)    Creatinine, Ser 8.13 (*)    GFR calc non Af Amer 5 (*)    GFR calc Af Amer 6 (*)    All other components within normal limits  CBC WITH DIFFERENTIAL/PLATELET - Abnormal; Notable for the following:    RBC 2.47 (*)    Hemoglobin 7.5 (*)    HCT 24.2 (*)    RDW 16.6 (*)    Neutro Abs 7.9 (*)    All other components within normal limits    EKG  EKG Interpretation  None       Radiology Dg Tibia/fibula Left  Result Date: 06/05/2017 CLINICAL DATA:  Dialysis pt, last treatment was Friday. Has pain and swelling to left lower leg and pain radiates to left thigh. Leg is red  and warm to the touch. Pt is unable to bear weight on it. She reports the swelling first began 2 weeks ag.*comment was truncated* EXAM: LEFT TIBIA AND FIBULA - 2 VIEW COMPARISON:  None. FINDINGS: No fracture of the tibia or fibula. Knee joint and ankle joint appear normal on two views. Dystrophic calcification in the soft tissues anterior to the mid shaft tibia measuring 12 mm favored sequelae of remote trauma. There is no foreign body in the soft tissue lower extremity. No subcutaneous gas. IMPRESSION: 1. No osseous abnormality the tibia fibula. 2. No radiographic evidence of lower extremity infection. Electronically Signed   By: Suzy Bouchard M.D.   On: 06/05/2017 09:01   Dg Ankle Complete Left  Result Date: 06/05/2017 CLINICAL DATA:  Dialysis pt, last treatment was Friday. Has pain and swelling to left lower leg and pain radiates to left thigh. Leg is red and warm to the touch. Pt is unable to bear weight on it. She reports the swelling first began 2 weeks ag.*comment was truncated* EXAM: LEFT ANKLE COMPLETE - 3+ VIEW COMPARISON:  None. FINDINGS: Ankle mortise intact. The talar dome is normal. No malleolar fracture. The calcaneus is normal. No foreign body.  No subcutaneous gas.  Degenerate changes midfoot IMPRESSION: No acute osseous findings. No evidence of soft tissue infection by radiography. Electronically Signed   By: Suzy Bouchard M.D.   On: 06/05/2017 09:02    Procedures Procedures (including critical care time)  Medications Ordered in ED Medications  cephALEXin (KEFLEX) capsule 500 mg (500 mg Oral Given 06/05/17 1015)  sulfamethoxazole-trimethoprim (BACTRIM DS,SEPTRA DS) 800-160 MG per tablet 1 tablet (1 tablet Oral Given 06/05/17 1015)  HYDROmorphone (DILAUDID) injection 1 mg (1 mg Intravenous Given 06/05/17 1015)     Initial Impression / Assessment and Plan / ED Course  I have reviewed the triage vital signs and the nursing notes.  Pertinent labs & imaging results that were  available during my care of the patient were reviewed by me and considered in my medical decision making (see chart for details).     Patient presents with redness and swelling to her leg. There is no evidence of bony injury. No evidence of DVT. It appears that she has an underlying cellulitis. She's afebrile and otherwise well-appearing. She's allergic to both clindamycin and vancomycin. She was given oral antibiotics in the ED. Her labs are non-concerning other than her hemoglobin has dropped from her baseline value. She doesn't report any symptoms related to this. She denies any blood in her stool. I discussed the options with her and given that she's asymptomatic, she will have it rechecked again tomorrow at dialysis. I encouraged her to return if she has any worsening symptoms. I did advise her that she needs to have the enlarged lymph node rechecked again once the infection has improved. It appeared that it had some increased vascularity which could be reactive but needs to be rechecked again once infection has resolved.  Final Clinical Impressions(s) / ED Diagnoses   Final diagnoses:  Cellulitis of left lower extremity  Lymphadenitis    New Prescriptions New Prescriptions   CEPHALEXIN (KEFLEX) 500 MG CAPSULE    Take 1 capsule (500 mg total) by mouth 2 (two) times daily.  Malvin Johns, MD 06/05/17 [phone]

## 2017-06-05 NOTE — ED Notes (Signed)
Pt has returned from imaging

## 2017-06-05 NOTE — ED Triage Notes (Signed)
Dialysis pt, last treatment was Friday. Reports recently falling and injuring left ankle. Has pain and swelling to left lower leg and pain radiates all the way up her leg.

## 2017-06-05 NOTE — Progress Notes (Signed)
VASCULAR LAB PRELIMINARY  PRELIMINARY  PRELIMINARY  PRELIMINARY  Left lower extremity venous duplex completed.    Preliminary report:  There is no DVT or SVT noted in the left lower extremity.  There is a significantly enlarged, vascularized lymph node noted in the left groin.  Called report to Dr. Loreen Freud, [facility], RVT 06/05/2017, 9:14 AM

## 2017-06-05 NOTE — ED Notes (Signed)
Patient transported to X-ray 

## 2017-06-06 DIAGNOSIS — D509 Iron deficiency anemia, unspecified: Secondary | ICD-10-CM | POA: Diagnosis not present

## 2017-06-06 DIAGNOSIS — N186 End stage renal disease: Secondary | ICD-10-CM | POA: Diagnosis not present

## 2017-06-06 DIAGNOSIS — N2581 Secondary hyperparathyroidism of renal origin: Secondary | ICD-10-CM | POA: Diagnosis not present

## 2017-06-06 DIAGNOSIS — M65062 Abscess of tendon sheath, left lower leg: Secondary | ICD-10-CM | POA: Diagnosis not present

## 2017-06-08 ENCOUNTER — Inpatient Hospital Stay (HOSPITAL_COMMUNITY): Payer: Medicare Other

## 2017-06-08 ENCOUNTER — Inpatient Hospital Stay (HOSPITAL_COMMUNITY)
Admission: EM | Admit: 2017-06-08 | Discharge: 2017-06-14 | DRG: 853 | Disposition: A | Discharge status: Home / Self Care | Payer: Medicare Other | Attending: Family Medicine | Admitting: Family Medicine

## 2017-06-08 ENCOUNTER — Encounter (HOSPITAL_COMMUNITY): Payer: Self-pay | Admitting: *Deleted

## 2017-06-08 DIAGNOSIS — Z881 Allergy status to other antibiotic agents status: Secondary | ICD-10-CM | POA: Diagnosis not present

## 2017-06-08 DIAGNOSIS — R296 Repeated falls: Secondary | ICD-10-CM | POA: Diagnosis present

## 2017-06-08 DIAGNOSIS — S81802A Unspecified open wound, left lower leg, initial encounter: Secondary | ICD-10-CM | POA: Diagnosis not present

## 2017-06-08 DIAGNOSIS — F17210 Nicotine dependence, cigarettes, uncomplicated: Secondary | ICD-10-CM | POA: Diagnosis present

## 2017-06-08 DIAGNOSIS — W1809XA Striking against other object with subsequent fall, initial encounter: Secondary | ICD-10-CM | POA: Diagnosis present

## 2017-06-08 DIAGNOSIS — K219 Gastro-esophageal reflux disease without esophagitis: Secondary | ICD-10-CM | POA: Diagnosis present

## 2017-06-08 DIAGNOSIS — A419 Sepsis, unspecified organism: Secondary | ICD-10-CM | POA: Diagnosis not present

## 2017-06-08 DIAGNOSIS — Z79899 Other long term (current) drug therapy: Secondary | ICD-10-CM | POA: Diagnosis not present

## 2017-06-08 DIAGNOSIS — D631 Anemia in chronic kidney disease: Secondary | ICD-10-CM | POA: Diagnosis not present

## 2017-06-08 DIAGNOSIS — N186 End stage renal disease: Secondary | ICD-10-CM | POA: Diagnosis not present

## 2017-06-08 DIAGNOSIS — Z8249 Family history of ischemic heart disease and other diseases of the circulatory system: Secondary | ICD-10-CM

## 2017-06-08 DIAGNOSIS — M65072 Abscess of tendon sheath, left ankle and foot: Secondary | ICD-10-CM | POA: Diagnosis present

## 2017-06-08 DIAGNOSIS — M659 Synovitis and tenosynovitis, unspecified: Secondary | ICD-10-CM

## 2017-06-08 DIAGNOSIS — M6510 Other infective (teno)synovitis, unspecified site: Secondary | ICD-10-CM | POA: Diagnosis present

## 2017-06-08 DIAGNOSIS — J45909 Unspecified asthma, uncomplicated: Secondary | ICD-10-CM | POA: Diagnosis not present

## 2017-06-08 DIAGNOSIS — G8929 Other chronic pain: Secondary | ICD-10-CM | POA: Diagnosis present

## 2017-06-08 DIAGNOSIS — F418 Other specified anxiety disorders: Secondary | ICD-10-CM | POA: Diagnosis present

## 2017-06-08 DIAGNOSIS — Q600 Renal agenesis, unilateral: Secondary | ICD-10-CM | POA: Diagnosis not present

## 2017-06-08 DIAGNOSIS — N2581 Secondary hyperparathyroidism of renal origin: Secondary | ICD-10-CM | POA: Diagnosis present

## 2017-06-08 DIAGNOSIS — R011 Cardiac murmur, unspecified: Secondary | ICD-10-CM | POA: Diagnosis not present

## 2017-06-08 DIAGNOSIS — G4730 Sleep apnea, unspecified: Secondary | ICD-10-CM | POA: Diagnosis not present

## 2017-06-08 DIAGNOSIS — Y92009 Unspecified place in unspecified non-institutional (private) residence as the place of occurrence of the external cause: Secondary | ICD-10-CM | POA: Diagnosis not present

## 2017-06-08 DIAGNOSIS — L03116 Cellulitis of left lower limb: Secondary | ICD-10-CM | POA: Diagnosis not present

## 2017-06-08 DIAGNOSIS — L02416 Cutaneous abscess of left lower limb: Secondary | ICD-10-CM | POA: Diagnosis not present

## 2017-06-08 DIAGNOSIS — M19039 Primary osteoarthritis, unspecified wrist: Secondary | ICD-10-CM | POA: Diagnosis present

## 2017-06-08 DIAGNOSIS — Z886 Allergy status to analgesic agent status: Secondary | ICD-10-CM

## 2017-06-08 DIAGNOSIS — M1710 Unilateral primary osteoarthritis, unspecified knee: Secondary | ICD-10-CM | POA: Diagnosis present

## 2017-06-08 DIAGNOSIS — M545 Low back pain: Secondary | ICD-10-CM | POA: Diagnosis present

## 2017-06-08 DIAGNOSIS — Z885 Allergy status to narcotic agent status: Secondary | ICD-10-CM | POA: Diagnosis not present

## 2017-06-08 DIAGNOSIS — I361 Nonrheumatic tricuspid (valve) insufficiency: Secondary | ICD-10-CM | POA: Diagnosis not present

## 2017-06-08 DIAGNOSIS — M898X9 Other specified disorders of bone, unspecified site: Secondary | ICD-10-CM | POA: Diagnosis present

## 2017-06-08 DIAGNOSIS — Z79891 Long term (current) use of opiate analgesic: Secondary | ICD-10-CM | POA: Diagnosis not present

## 2017-06-08 DIAGNOSIS — Z992 Dependence on renal dialysis: Secondary | ICD-10-CM

## 2017-06-08 DIAGNOSIS — M479 Spondylosis, unspecified: Secondary | ICD-10-CM | POA: Diagnosis present

## 2017-06-08 DIAGNOSIS — F419 Anxiety disorder, unspecified: Secondary | ICD-10-CM | POA: Diagnosis present

## 2017-06-08 DIAGNOSIS — I120 Hypertensive chronic kidney disease with stage 5 chronic kidney disease or end stage renal disease: Secondary | ICD-10-CM | POA: Diagnosis not present

## 2017-06-08 DIAGNOSIS — Z7951 Long term (current) use of inhaled steroids: Secondary | ICD-10-CM | POA: Diagnosis not present

## 2017-06-08 DIAGNOSIS — M65062 Abscess of tendon sheath, left lower leg: Secondary | ICD-10-CM | POA: Diagnosis not present

## 2017-06-08 DIAGNOSIS — M25572 Pain in left ankle and joints of left foot: Secondary | ICD-10-CM | POA: Diagnosis not present

## 2017-06-08 DIAGNOSIS — R600 Localized edema: Secondary | ICD-10-CM | POA: Diagnosis not present

## 2017-06-08 HISTORY — DX: Abscess of tendon sheath, left ankle and foot: M65.072

## 2017-06-08 LAB — CBC WITH DIFFERENTIAL/PLATELET
Basophils Absolute: 0 10*3/uL (ref 0.0–0.1)
Basophils Relative: 0 %
EOS ABS: 0 10*3/uL (ref 0.0–0.7)
Eosinophils Relative: 0 %
HEMATOCRIT: 26.9 % — AB (ref 36.0–46.0)
HEMOGLOBIN: 8.2 g/dL — AB (ref 12.0–15.0)
LYMPHS ABS: 1.7 10*3/uL (ref 0.7–4.0)
LYMPHS PCT: 13 %
MCH: 29.6 pg (ref 26.0–34.0)
MCHC: 30.5 g/dL (ref 30.0–36.0)
MCV: 97.1 fL (ref 78.0–100.0)
Monocytes Absolute: 0.7 10*3/uL (ref 0.1–1.0)
Monocytes Relative: 5 %
NEUTROS ABS: 10.6 10*3/uL — AB (ref 1.7–7.7)
NEUTROS PCT: 82 %
Platelets: 299 10*3/uL (ref 150–400)
RBC: 2.77 MIL/uL — AB (ref 3.87–5.11)
RDW: 16.2 % — ABNORMAL HIGH (ref 11.5–15.5)
WBC: 12.9 10*3/uL — AB (ref 4.0–10.5)

## 2017-06-08 LAB — I-STAT CG4 LACTIC ACID, ED
LACTIC ACID, VENOUS: 1.05 mmol/L (ref 0.5–1.9)
Lactic Acid, Venous: 0.62 mmol/L (ref 0.5–1.9)

## 2017-06-08 LAB — COMPREHENSIVE METABOLIC PANEL
ALT: 11 U/L — AB (ref 14–54)
AST: 13 U/L — ABNORMAL LOW (ref 15–41)
Albumin: 2.9 g/dL — ABNORMAL LOW (ref 3.5–5.0)
Alkaline Phosphatase: 94 U/L (ref 38–126)
Anion gap: 17 — ABNORMAL HIGH (ref 5–15)
BILIRUBIN TOTAL: 0.8 mg/dL (ref 0.3–1.2)
BUN: 35 mg/dL — ABNORMAL HIGH (ref 6–20)
CALCIUM: 9.8 mg/dL (ref 8.9–10.3)
CHLORIDE: 94 mmol/L — AB (ref 101–111)
CO2: 29 mmol/L (ref 22–32)
CREATININE: 6.96 mg/dL — AB (ref 0.44–1.00)
GFR calc Af Amer: 7 mL/min — ABNORMAL LOW (ref >60)
GFR, EST NON AFRICAN AMERICAN: 6 mL/min — AB (ref >60)
Glucose, Bld: 154 mg/dL — ABNORMAL HIGH (ref 65–99)
Potassium: 3.9 mmol/L (ref 3.5–5.1)
Sodium: 140 mmol/L (ref 135–145)
TOTAL PROTEIN: 6 g/dL — AB (ref 6.5–8.1)

## 2017-06-08 MED ORDER — DOXYCYCLINE HYCLATE 100 MG IV SOLR
100.0000 mg | Freq: Two times a day (BID) | INTRAVENOUS | Status: DC
  Filled 2017-06-08: qty 100

## 2017-06-08 MED ORDER — PIPERACILLIN-TAZOBACTAM 3.375 G IVPB 30 MIN: 3.3750 g | Freq: Once | INTRAVENOUS | Status: DC

## 2017-06-08 MED ORDER — HYDROMORPHONE HCL 1 MG/ML IJ SOLN
1.0000 mg | INTRAMUSCULAR | Status: DC | PRN
  Administered 2017-06-08 – 2017-06-09 (×5): 1 mg via INTRAVENOUS
  Filled 2017-06-08 (×5): qty 1

## 2017-06-08 MED ORDER — ALBUTEROL SULFATE (2.5 MG/3ML) 0.083% IN NEBU
INHALATION_SOLUTION | RESPIRATORY_TRACT | Status: AC
  Filled 2017-06-08: qty 6

## 2017-06-08 MED ORDER — DOXYCYCLINE HYCLATE 100 MG IV SOLR
100.0000 mg | Freq: Two times a day (BID) | INTRAVENOUS | Status: DC
  Administered 2017-06-08: 100 mg via INTRAVENOUS
  Filled 2017-06-08 (×3): qty 100

## 2017-06-08 MED ORDER — HYDROMORPHONE HCL 1 MG/ML IJ SOLN
1.0000 mg | Freq: Once | INTRAMUSCULAR | Status: AC
  Administered 2017-06-08: 1 mg via INTRAVENOUS
  Filled 2017-06-08: qty 1

## 2017-06-08 MED ORDER — CEFAZOLIN SODIUM-DEXTROSE 2-4 GM/100ML-% IV SOLN
2.0000 g | INTRAVENOUS | Status: DC
  Administered 2017-06-08 – 2017-06-10 (×2): 2 g via INTRAVENOUS
  Filled 2017-06-08 (×3): qty 100

## 2017-06-08 MED ORDER — OXYCODONE-ACETAMINOPHEN 5-325 MG PO TABS
1.0000 | ORAL_TABLET | ORAL | Status: DC | PRN
  Administered 2017-06-08: 1 via ORAL

## 2017-06-08 NOTE — H&P (Signed)
[name] Service Pager: [phone]  Patient name: [name] Medical record number: [id number] Date of birth: 1971-06-10 Age: 46 y.o. Gender: female  Primary Care Provider: Greig Right, MD Consultants: Nephrology, Ortho Code Status: Full  Chief Complaint: Left ankle pain  Assessment and Plan: ABIGAELLE VERLEY is a 46 y.o. female presenting with left ankle pain and swelling. PMH is significant for ESRD on HD, HTN, bronchitis, anxiety/depression, chronic low back pain.  Sepsis with left ankle pain: Meeting SIRS criteria with HR >90 and WBC > 12. Concern for septic arthritis, given that she has edema and excruciating pain of the left ankle. She is unable to move her ankle on exam. Other differentials include osteomyelitis vs cellulitis. No open wounds. Think infectious cause is most likely with subjective fevers and chills at home, warmth of skin, and mildly elevated WBC of 12.9. Other differentials include gout, as patient has history of this, although it typically affects her hands/elbows and does not typically cause infectious symptoms. This could also be a soft tissue injury due to her fall, although that would not explain the cellulitic appearance of her skin. DVT less likely with negative duplex US on 8/5. - Admit to telemetry under inpatient status, attending Dr. Ardelia Mems. - Vancomycin and Zosyn per pharm - Blood cultures ordered - Will obtain MRI left ankle to evaluate further - Ortho consulted (Dr. Percell Miller). Will see her this afternoon. Appreciate their assistance. - Dilaudid 1mg  q3hrs prn and Tylenol for pain - Hold off on fluids for now, as BP is mildly hypertensive and patient is ESRD - Repeat CBC and renal function panel in the morning  New systolic murmur: III/VI crescendo-decrescendo murmur heard loudest in the LUSB. Given septic arthritis vs cellulitis on the differential, concern for bacterial  endocarditis. Could also be radiating from her AV fistula. Patient does not have a history of IV drug use. - Will obtain ECHO - Blood cultures pending  ESRD on HD: Nephrologist is Dr. Justin Mend. MWF in [place]. Last HD session was Monday and got the full session. Has been on HD since 2013. 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 K 3.9 and patient is not fluid overloaded, so no indication for emergent HD. - Will consult Nephrology - Continue home Phoslo 2,001mg  bid with meals, Renvela 2,400mg  bid with meals, and Rena-vit daily - Unclear why she is on Lasix 80mg  bid since she is ESRD. Will hold for now. Appreciate Nephrology input.  HTN: BP 140/90 in the ED. - Hold home Hydralazine 50mg  tid, Lisinopril 40mg  daily, and Norvasc 10mg  daily while septic - Will add Hydralazine IV prn for SBP > 160  Anemia in CKD: Hgb 8.2 in the ED. Baseline 9-11. - Trend CBCs - Appreciate any recommendations per Nephro.  ?Bronchitis: Unclear if she has had PFTs in the past, but she smokes 1/2 ppd. Per patient, she does not have a diagnosis of COPD. Breathing comfortably on room air in the ED. - Continue home Symbicort 2 puffs bid, Albuterol 2 puffs q6hrs prn, and Singulair 10mg  daily  Anxiety: Takes Xanax tid prn at home. Patient appears anxious on exam and states she is worried about what is going on with her ankle. - Continue home Xanax  Chronic Low Back Pain: Takes Dilaudid 2mg  bid prn and Oxycodone 40mg  bid at home. Follows with a pain clinic in [place]. - Switch to Dilaudid IV while having acute pain - Continue Robaxin 500mg  bid  and Flexeril 20mg  qhs  FEN/GI: Renal diet with fluid restriction. Prophylaxis: Heparin sq  Disposition: Admit to telemetry under inpatient status. Discharge home pending clinical improvement.  History of Present Illness:  [name] is a 46 y.o. female presenting with left leg pain that started 2 weeks ago. She was walking in her house when  she tripped on the left foot and fell. She got "caught up in the carpet". She doesn't remember if she hit her foot when she fell. She landed on her hands and knees. She had swelling right away. Her husband tried massaging her calf. The pain and swelling have been progressively getting worse. The pain is a "burning sensation" that starts in her calf and shoots down her leg. The pain is worse with moving. Nothing makes the pain better. She noticed redness on Sunday, so she came to the ED. They did x-rays, which were normal. She was started on Keflex bid, which hasn't helped. She has been having subjective fevers. She has been feeling well for the last week. She has also had nausea and vomiting.   Of note, her last HD session was Monday. She was able to complete the session without any issues. She has not missed any HD recently. She did not go to HD today because she was in the ED.   When she was in the ED on 8/5, left ankle x-ray was normal and duplex US did not show any evidence of DVT. In the ED today, she was afebrile with BP 140/80 and HR in the 90s. Labs were significant for Cr 6.96, K 3.9, WBC 12.9, Hgb 8.2, lactic acid 1.05. She was started on Vancomycin. She was admitted for further management.   Review Of Systems: Per HPI with the following additions: see below.  Review of Systems  Constitutional: Positive for chills and fever.  HENT: Positive for congestion and sore throat.   Eyes: Negative for blurred vision and double vision.  Respiratory: Positive for cough. Negative for shortness of breath.   Cardiovascular: Positive for palpitations. Negative for chest pain.  Gastrointestinal: Positive for nausea and vomiting.  Genitourinary: Negative for dysuria and hematuria.  Musculoskeletal: Positive for falls and joint pain.  Neurological: Positive for headaches. Negative for dizziness.  Psychiatric/Behavioral: Positive for depression. The patient is nervous/anxious.     Patient Active Problem  List   Diagnosis Date Noted  . Sepsis ([place]) 02/15/2014  . Cellulitis 02/14/2014  . Thinning of skin 04/09/2013  . Mechanical complication of other vascular device, implant, and graft 04/09/2013  . Pain, limb, left 10/18/2012  . Swollen 10/18/2012  . Status post endometrial ablation 12/10/2011  . End stage renal disease ([place]) 11/24/2011  . Renal failure (ARF), acute on chronic (HCC) 10/19/2011  . HTN (hypertension) 10/19/2011  . Anemia 10/19/2011    Past Medical History: Past Medical History:  Diagnosis Date  . ADD (attention deficit disorder)   . Anemia   . Anemia   . Anxiety   . Arthritis    knee, wrist, back   . Asthma   . Blood transfusion 06/2011   6 units transfused at [facility]  . Bronchitis   . Depression   . Dizziness   . ESRD (end stage renal disease) ([place])    Hemodialysis 3 times a  week -   . Gastritis   . GERD (gastroesophageal reflux disease)   . Gout   . Headache(784.0)   . Hypertension    sees Dr. Wendie Agreste  . Neuromuscular disorder (  HCC)    carpal tunnel  . Obesity   . Reflux   . Renal insufficiency   . Shortness of breath   . Sleep apnea    does not use CPAP    Past Surgical History: Past Surgical History:  Procedure Laterality Date  . AV FISTULA PLACEMENT  06/10/11   Left brachiocephalic AVF  . AV Fistula surgery  11/30/11   [facility] - redo fistula  . Carpel tunnel release     x 2; bilateral  . CESAREAN SECTION    . colonscopy    . DILATION AND CURETTAGE OF UTERUS    . HYSTEROSCOPY WITH THERMACHOICE  12/10/2011   Procedure: HYSTEROSCOPY WITH THERMACHOICE;  Surgeon: Betsy Coder, MD;  Location: [facility];  Service: Gynecology;  Laterality: N/A;  . IRRIGATION AND DEBRIDEMENT ABSCESS Left 02/15/2014   Procedure: IRRIGATION AND DRAINAGE OF LOW BACK ABSCESS;  Surgeon: Imogene Burn. Georgette Dover, MD;  Location: [facility];  Service: General;  Laterality: Left;  . microdiskectomy  04/2001   Herniated disk L5-S1  . RENAL BIOPSY  04/2004  . REVISON OF  ARTERIOVENOUS FISTULA Left 01/04/2017   Procedure: REVISON OF ARTERIOVENOUS FISTULA; CEPHALIC VEIN TURNDOWN;  Surgeon: Angelia Mould, MD;  [facility];  Service: Vascular;  Laterality: Left;  . SHUNTOGRAM  Dec. 18, 2013   Left arm  . SHUNTOGRAM N/A 10/18/2012   Procedure: Earney Mallet;  Surgeon: Conrad Pittsburg, MD;  Location: [facility] CATH LAB;  Service: Cardiovascular;  Laterality: N/A;  . SHUNTOGRAM Left 04/18/2013   Procedure: Earney Mallet;  Surgeon: Serafina Mitchell, MD;  Location: [facility] CATH LAB;  Service: Cardiovascular;  Laterality: Left;  . SPINE SURGERY  2004   Lumbar diskectomy   . UPPER GASTROINTESTINAL ENDOSCOPY    . WISDOM TOOTH EXTRACTION      Social History: Social History  Substance Use Topics  . Smoking status: Current Every Day Smoker    Packs/day: 0.50    Years: 18.00    Types: Cigarettes  . Smokeless tobacco: Never Used  . Alcohol use Yes     Comment: ocassional   Additional social history: Smokes 1/2 pack per day for 5 years, no alcohol use, no drug use Please also refer to relevant sections of EMR.  Family History: Family History  Problem Relation Age of Onset  . Hypertension Mother   . Heart disease Father        Heart Disease before age 13  . Hypertension Father   . Heart attack Father   . Hypertension Daughter     Allergies and Medications: Allergies  Allergen Reactions  . Nsaids Other (See Comments)    Renal failure  . Clarithromycin Nausea And Vomiting  . Demerol Nausea And Vomiting and Other (See Comments)    Severe headaches  . Fentanyl Hives, Nausea And Vomiting, Rash and Other (See Comments)    Reaction to patches only  . Morphine And Related Nausea And Vomiting and Other (See Comments)    Severe headache. p does not want it!  . Other Other (See Comments)    Seeds and nuts- has diverticulitis   . Vancomycin Hives  . Clindamycin/Lincomycin Hives and Rash   No current facility-administered medications on file prior to encounter.     Current Outpatient Prescriptions on File Prior to Encounter  Medication Sig Dispense Refill  . albuterol (PROVENTIL HFA;VENTOLIN HFA) 108 (90 BASE) MCG/ACT inhaler Inhale 2 puffs into the lungs every 6 (six) hours as needed for shortness of breath.     Marland Kitchen  ALPRAZolam (XANAX) 1 MG tablet Take 1 mg by mouth 3 (three) times daily.    Marland Kitchen amLODipine (NORVASC) 10 MG tablet Take 10 mg by mouth at bedtime.     Marland Kitchen b complex vitamins tablet Take 1 tablet by mouth daily.    . budesonide-formoterol (SYMBICORT) 160-4.5 MCG/ACT inhaler Inhale 2 puffs into the lungs 2 (two) times daily.    . calcium acetate (PHOSLO) 667 MG capsule Take 2,001 mg by mouth 2 (two) times daily with a meal.    . cephALEXin (KEFLEX) 500 MG capsule Take 1 capsule (500 mg total) by mouth 2 (two) times daily. 14 capsule 0  . cyclobenzaprine (FLEXERIL) 10 MG tablet Take 20 mg by mouth at bedtime.     . furosemide (LASIX) 80 MG tablet Take 80 mg by mouth 2 (two) times daily.     . hydrALAZINE (APRESOLINE) 50 MG tablet Take 50 mg by mouth 3 (three) times daily.    Marland Kitchen HYDROmorphone (DILAUDID) 2 MG tablet Take 2 mg by mouth every 12 (twelve) hours as needed for severe pain.    Marland Kitchen lidocaine-prilocaine (EMLA) cream Apply 1 application topically See admin instructions. Apply topically one hour prior to dialysis - Monday, Wednesday, Friday    . lisinopril (PRINIVIL,ZESTRIL) 40 MG tablet Take 40 mg by mouth daily.    . Melatonin 5 MG TABS Take 5 mg by mouth at bedtime.     . methocarbamol (ROBAXIN) 500 MG tablet Take 500 mg by mouth 2 (two) times daily.     . montelukast (SINGULAIR) 10 MG tablet Take 10 mg by mouth daily.     . multivitamin (RENA-VIT) TABS tablet Take 1 tablet by mouth daily.    Marland Kitchen oxyCODONE (OXYCONTIN) 40 mg 12 hr tablet Take 40 mg by mouth every 12 (twelve) hours.    . promethazine (PHENERGAN) 25 MG tablet Take 25 mg by mouth every 6 (six) hours as needed for nausea.     . sevelamer carbonate (RENVELA) 800 MG tablet Take 2,400 mg  by mouth 2 (two) times daily with a meal.     . SUMAtriptan (IMITREX) 50 MG tablet Take 50 mg by mouth daily as needed for migraine or headache.       Objective: BP 140/80 (BP Location: Right Arm)   Pulse 91   Temp 98 F (36.7 C)   Resp (!) 24   SpO2 98%  Exam: General: Tearful, tired-appearing Eyes: PERRLA, EOMI, no scleral icterus ENTM: Nose normal, MMM Neck: Supple, full ROM Lymph: No cervical lymphadenopathy, 2cm x 1cm mobile lymph node present in the left groin Cardiovascular: RRR, III/VI systolic crescendo-decrescendo murmur loudest in the LUSB Respiratory: CTAB, normal work of breathing Gastrointestinal: +BS, soft, non-tender, non-distended MSK: Two AV fistula present in the left upper extremity, both with a palpable thrill, no overlying erythema or warmth; see picture below of left ankle; left ankle is warm to the touch Derm: No rashes or lesions other than erythema of the left ankle, no open wounds Neuro: Awake, alert, oriented x 3, moving all extremities, no gross deficits. Psych: Appears tearful and anxious, normal rate of speech, normal thought content, normal judgement       Labs and Imaging: CBC BMET   Recent Labs Lab 06/08/17 0233  WBC 12.9*  HGB 8.2*  HCT 26.9*  PLT 299    Recent Labs Lab 06/08/17 0233  NA 140  K 3.9  CL 94*  CO2 29  BUN 35*  CREATININE 6.96*  GLUCOSE 154*  CALCIUM 9.8     Lactic acid 1.05  Kaelin Bonelli, Pete Pelt, MD 06/08/2017, 10:25 AM PGY-3, [facility] pager: [phone], text pages welcome

## 2017-06-08 NOTE — Progress Notes (Signed)
Received a call from [facility] (pharmacist) stating that patient is allergic to both Vancomycin and Clindamycin. Patient received Doxycycline x 1 in the ED and currently has orders for Zosyn. I consulted ID (Dr. Tommy Medal) over the phone to discuss options. He recommends that we stop all antibiotics and observe the patient for now. If we continue her on antibiotics overnight and she has a joint aspiration in the morning, her cultures will likely not grow anything and we will not know what we are treating or for how long she needs treatment. If her MRI does not show signs of septic arthritis or osteomyelitis and we think this is just a cellulitis, he recommends treatment with Ancef. - Will stop all antibiotics for now - Still awaiting MRI of left ankle - Will also follow-up ortho recommendations  Greatly appreciate ID's assistance.  Hyman Bible, MD

## 2017-06-08 NOTE — ED Notes (Signed)
This nurse called dialysis and she is scheduled for dialysis tomorrow

## 2017-06-08 NOTE — Progress Notes (Signed)
MRI L ankle came back showing possible tear and tenosynovitis of the tibialis posterior tendon, possible edema vs myositis of the tibialis posterior muscle, and diffuse edema of the ankle and foot consistent with third spacing of fluid vs cellulitis. No joint effusion was appreciated. Septic arthritis much less likely and osteomyelitis ruled out. Will start patient on Ancef for cellulitis treatment per ID recommendations. Discussed with attending Dr. Erin Hearing.  Hyman Bible, MD

## 2017-06-08 NOTE — ED Provider Notes (Signed)
[facility] DEPT Provider Note   CSN: [id number] Arrival date & time: 06/08/17  0200     History   Chief Complaint Chief Complaint  Patient presents with  . Cellulitis    HPI [name] is a 46 y.o. female.  [name] is a 46 y.o. Female with a history of end-stage renal disease on dialysis Monday, Wednesday and Friday who presents to the emergency department complaining of increasing left ankle pain and redness over the past several days. She is seen in the emergency department 3 days ago and diagnosed with cellulitis. She was started on Keflex. She reports she's been taking Keflex for 3 days now with no improvement. She reports increasing redness going up her left leg as well as subjective fevers at home. Last full dialysis was Monday. She denies any known injury or trauma to her ankle or foot. She reports pain is worse with movement at her ankle, however she is able to move her ankle joint. She denies numbness, tingling, weakness, knee pain, chest pain, shortness of breath, palpitations, or known trauma.    The history is provided by the patient, the spouse and medical records. No language interpreter was used.    Past Medical History:  Diagnosis Date  . ADD (attention deficit disorder)   . Anemia   . Anemia   . Anxiety   . Arthritis    knee, wrist, back   . Asthma   . Blood transfusion 06/2011   6 units [facility]used at [facility]  . Bronchitis   . Depression   . Dizziness   . ESRD (end stage renal disease) ([place])    Hemodialysis 3 times a  week - [place]  . Gastritis   . GERD (gastroesophageal reflux disease)   . Gout   . Headache(784.0)   . Hypertension    sees Dr. Wendie Agreste  . Neuromuscular disorder (HCC)    carpal tunnel  . Obesity   . Reflux   . Renal insufficiency   . Shortness of breath   . Sleep apnea    does not use CPAP    Patient Active Problem List   Diagnosis Date Noted  . Sepsis ([place]) 02/15/2014  . Cellulitis 02/14/2014    . Thinning of skin 04/09/2013  . Mechanical complication of other vascular device, implant, and graft 04/09/2013  . Pain, limb, left 10/18/2012  . Swollen 10/18/2012  . Status post endometrial ablation 12/10/2011  . End stage renal disease ([place]) 11/24/2011  . Renal failure (ARF), acute on chronic (HCC) 10/19/2011  . HTN (hypertension) 10/19/2011  . Anemia 10/19/2011    Past Surgical History:  Procedure Laterality Date  . AV FISTULA PLACEMENT  06/10/11   Left brachiocephalic AVF  . AV Fistula surgery  11/30/11   Zacarias Pontes - redo fistula  . Carpel tunnel release     x 2; bilateral  . CESAREAN SECTION    . colonscopy    . DILATION AND CURETTAGE OF UTERUS    . HYSTEROSCOPY WITH THERMACHOICE  12/10/2011   Procedure: HYSTEROSCOPY WITH THERMACHOICE;  Surgeon: Betsy Coder, MD;  Location: [facility];  Service: Gynecology;  Laterality: N/A;  . IRRIGATION AND DEBRIDEMENT ABSCESS Left 02/15/2014   Procedure: IRRIGATION AND DRAINAGE OF LOW BACK ABSCESS;  Surgeon: Imogene Burn. Georgette Dover, MD;  Location: [place];  Service: General;  Laterality: Left;  . microdiskectomy  04/2001   Herniated disk L5-S1  . RENAL BIOPSY  04/2004  . REVISON OF ARTERIOVENOUS FISTULA Left 01/04/2017  Procedure: REVISON OF ARTERIOVENOUS FISTULA; CEPHALIC VEIN TURNDOWN;  Surgeon: Angelia Mould, MD;  Location: [place];  Service: Vascular;  Laterality: Left;  . SHUNTOGRAM  Dec. 18, 2013   Left arm  . SHUNTOGRAM N/A 10/18/2012   Procedure: Earney Mallet;  Surgeon: Conrad Galena, MD;  Location: [facility] CATH LAB;  Service: Cardiovascular;  Laterality: N/A;  . SHUNTOGRAM Left 04/18/2013   Procedure: Earney Mallet;  Surgeon: Serafina Mitchell, MD;  Location: [facility] C[facility] LAB;  Service: Cardiovascular;  Laterality: Left;  . SPINE SURGERY  2004   Lumbar diskectomy   . UPPER GASTROINTESTINAL ENDOSCOPY    . WISDOM TOOTH EXTRACTION      OB History    No data available       Home Medications    Prior to Admission medications   Medication Sig  Start Date End Date Taking? Authorizing Provider  albuterol (PROVENTIL HFA;VENTOLIN HFA) 108 (90 BASE) MCG/ACT inhaler Inhale 2 puffs into the lungs every 6 (six) hours as needed for shortness of breath.    Yes [provider]  ALPRAZolam Duanne Moron) 1 MG tablet Take 1 mg by mouth 3 (three) times daily.   Yes [provider]  amLODipine (NORVASC) 10 MG tablet Take 10 mg by mouth at bedtime.  02/08/14  Yes [provider]  amLODipine (NORVASC) 10 MG tablet Take 10 mg by mouth daily. 02/23/17  Yes [provider]  b complex vitamins tablet Take 1 tablet by mouth daily.   Yes [provider]  budesonide-formoterol (SYMBICORT) 160-4.5 MCG/ACT inhaler Inhale 2 puffs into the lungs 2 (two) times daily.   Yes [provider]  buPROPion (WELLBUTRIN SR) 150 MG 12 hr tablet Take 150 mg by mouth daily. 04/15/17  Yes [provider]  calcium acetate (PHOSLO) 667 MG capsule Take 2,001 mg by mouth 2 (two) times daily with a meal.   Yes [provider]  cephALEXin (KEFLEX) 500 MG capsule Take 1 capsule (500 mg total) by mouth 2 (two) times daily. Patient taking differently: Take 500 mg by mouth 2 (two) times daily. Has 8 days remianing. 06/05/17  Yes Malvin Johns, MD  cyclobenzaprine (FLEXERIL) 10 MG tablet Take 20 mg by mouth at bedtime.    Yes [provider]  DULoxetine (CYMBALTA) 60 MG capsule Take 60 mg by mouth daily. 03/30/17  Yes [provider]  furosemide (LASIX) 80 MG tablet Take 80 mg by mouth 2 (two) times daily.    Yes [provider]  hydrALAZINE (APRESOLINE) 50 MG tablet Take 50 mg by mouth 2 (two) times daily.    Yes [provider]  HYDROmorphone (DILAUDID) 2 MG tablet Take 2 mg by mouth every 12 (twelve) hours as needed for severe pain.   Yes [provider]  lidocaine-prilocaine (EMLA) cream Apply 1 application topically See admin instructions. Apply topically one hour prior to dialysis -  Monday, Wednesday, Friday   Yes [provider]  lisinopril (PRINIVIL,ZESTRIL) 40 MG tablet Take 40 mg by mouth daily.   Yes [provider]  Melatonin 5 MG TABS Take 5 mg by mouth at bedtime.    Yes [provider]  methocarbamol (ROBAXIN) 500 MG tablet Take 500 mg by mouth 2 (two) times daily.  03/22/13  Yes [provider]  montelukast (SINGULAIR) 10 MG tablet Take 10 mg by mouth daily.    Yes [provider]  multivitamin (RENA-VIT) TABS tablet Take 1 tablet by mouth daily.   Yes [provider]  oxyCODONE (OXYCONTIN) 40 mg 12 hr tablet Take 40 mg by mouth every 12 (twelve) hours.   Yes [provider]  promethazine (PHENERGAN) 25 MG tablet Take 25 mg by mouth every 6 (six) hours as needed for nausea.    Yes [provider]  sevelamer carbonate (RENVELA) 800 MG tablet Take 2,400 mg by mouth 2 (two) times daily with a meal.    Yes [provider]  SUMAtriptan (IMITREX) 50 MG tablet Take 50 mg by mouth daily as needed for migraine or headache.  02/14/14   [provider]    Family History Family History  Problem Relation Age of Onset  . Hypertension Mother   . Heart disease Father        Heart Disease before age 24  . Hypertension Father   . Heart attack Father   . Hypertension Daughter     Social History Social History  Substance Use Topics  . Smoking status: Current Every Day Smoker    Packs/day: 0.50    Years: 18.00    Types: Cigarettes  . Smokeless tobacco: Never Used  . Alcohol use Yes     Comment: ocassional     Allergies   Nsaids; Clarithromycin; Demerol; Fentanyl; Morphine and related; Other; Vancomycin; and Clindamycin/lincomycin   Review of Systems Review of Systems  Constitutional: Positive for fever.  HENT: Negative for congestion and sore throat.   Eyes: Negative for visual disturbance.  Respiratory: Negative for cough, shortness of breath and wheezing.     Cardiovascular: Negative for chest pain and palpitations.  Gastrointestinal: Negative for abdominal pain, diarrhea, nausea and vomiting.  Genitourinary: Negative for flank pain.  Musculoskeletal: Positive for arthralgias and joint swelling. Negative for back pain and neck pain.  Skin: Positive for color change and rash. Negative for wound.  Neurological: Negative for weakness, numbness and headaches.     Physical Exam Updated Vital Signs BP (!) 150/83 (BP Location: Right Arm)   Pulse 88   Temp 98 F (36.7 C)   Resp 20   Wt 65.8 kg (145 lb)   SpO2 96%   BMI 24.89 kg/m   Physical Exam  Constitutional: She appears well-developed and well-nourished. No distress.  Nontoxic appearing.  HENT:  Head: Normocephalic and atraumatic.  Mouth/Throat: Oropharynx is clear and moist.  Eyes: Pupils are equal, round, and reactive to light. Conjunctivae are normal. Right eye exhibits no discharge. Left eye exhibits no discharge.  Neck: Neck supple.  Cardiovascular: Normal rate, regular rhythm and intact distal pulses.  Exam reveals no gallop and no friction rub.   Murmur heard. Blowing systolic murmur noted.  Pulmonary/Chest: Effort normal and breath sounds normal. No respiratory distress. She has no wheezes. She has no rales.  Lungs are clear to ascultation bilaterally. Symmetric chest expansion bilaterally. No increased work of breathing. No rales or rhonchi.    Abdominal: Soft. There is no tenderness.  Musculoskeletal: She exhibits edema and tenderness.  Erythema and edema noted to her left ankle and foot with erythema streaking up her leg.  Pain with ROM of her left ankle. No vesicles, bulla or open wounds noted.   Lymphadenopathy:    She has no cervical adenopathy.  Neurological: She is alert. No sensory deficit. Coordination normal.  Skin: Skin is warm and dry. Capillary refill takes less than 2 seconds. Rash noted. She is not diaphoretic. There is erythema. No pallor.  Psychiatric: She  has a normal mood and affect. Her behavior is normal.  Nursing note and  vitals reviewed.    ED Treatments / Results  Labs (all labs ordered are listed, but only abnormal results are displayed) Labs Reviewed  COMPREHENSIVE METABOLIC PANEL - Abnormal; Notable for the following:       Result Value   Chloride 94 (*)    Glucose, Bld 154 (*)    BUN 35 (*)    Creatinine, Ser 6.96 (*)    Total Protein 6.0 (*)    Albumin 2.9 (*)    AST 13 (*)    ALT 11 (*)    GFR calc non Af Amer 6 (*)    GFR calc Af Amer 7 (*)    Anion gap 17 (*)    All other components within normal limits  CBC WITH DIFFERENTIAL/PLATELET - Abnormal; Notable for the following:    WBC 12.9 (*)    RBC 2.77 (*)    Hemoglobin 8.2 (*)    HCT 26.9 (*)    RDW 16.2 (*)    Neutro Abs 10.6 (*)    All other components within normal limits  CULTURE, BLOOD (ROUTINE X 2)  CULTURE, BLOOD (ROUTINE X 2)  I-STAT CG4 LACTIC ACID, ED  I-STAT CG4 LACTIC ACID, ED    EKG  EKG Interpretation None       Radiology No results found.  Procedures Procedures (including critical care time)  Medications Ordered in ED Medications  oxyCODONE-acetaminophen (PERCOCET/ROXICET) 5-325 MG per tablet 1 tablet (1 tablet Oral Given 06/08/17 0241)  HYDROmorphone (DILAUDID) injection 1 mg (not administered)  doxycycline (VIBRAMYCIN) 100 mg in dextrose 5 % 250 mL IVPB (100 mg Intravenous New Bag/Given 06/08/17 1305)  HYDROmorphone (DILAUDID) injection 1 mg (1 mg Intravenous Given 06/08/17 1044)     Initial Impression / Assessment and Plan / ED Course  I have reviewed the triage vital signs and the nursing notes.  Pertinent labs & imaging results that were available during my care of the patient were reviewed by me and considered in my medical decision making (see chart for details).     This is a 46 y.o. Female with a history of end-stage renal disease on dialysis Monday, Wednesday and Friday who presents to the emergency department complaining  of increasing left ankle pain and redness over the past several days. She is seen in the emergency department 3 days ago and diagnosed with cellulitis. She was started on Keflex. She reports she's been taking Keflex for 3 days now with no improvement. She reports increasing redness going up her left leg as well as subjective fevers at home. Last full dialysis was Monday. She denies any known injury or trauma to her ankle or foot. She reports pain is worse with movement at her ankle, however she is able to move her ankle joint.  On exam the patient is afebrile nontoxic appearing. She is erythema, tenderness and warmth noted to her left ankle that is streaking up her leg. She has pain with range of motion at her left ankle. She is neurovascular intact. X-rays were obtained 3 days ago which were unremarkable.  Blood work here today is remarkable for white count of 13,000. Concern for cellulitis. Lower suspicion for septic arthritis however this would still be in my differential. Also concern for possible osteomyelitis. Possibly will need MRI. Lower suspicion for gout. Patient started on doxycycline due to vancomycin allergy and failure of outpatient therapy with Keflex. Will admit for further work up. Patient and family agree with plan for admission.   I consulted with [facility]  medicine teaching service who accepted the patient for admission.   This patient was discussed with Dr. Dayna Barker who agrees with assessment and plan.   Final Clinical Impressions(s) / ED Diagnoses   Final diagnoses:  Cellulitis of left lower extremity  Heart murmur  ESRD on dialysis [facility])    New Prescriptions New Prescriptions   No medications on file     Waynetta Pean, Hershal Coria 06/08/17 1404    Mesner, Corene Cornea, MD 06/09/17 [phone]

## 2017-06-08 NOTE — Consult Note (Signed)
ORTHOPAEDIC CONSULTATION  REQUESTING PHYSICIAN: Leeanne Rio, MD  Chief Complaint: LLE pain  HPI: [name] is a 46 y.o. female who complains of multiple falls and injury to her LLE foot and ankle over the last month. 1wk ago she developed pain and redness and was started on PO abx. She failed this and represented to ED this past Tuesday. She is on IV abx now with symptoms no better or worse today. She is on dialysis and has been taking dilaudid for back pain for years.   Past Medical History:  Diagnosis Date  . ADD (attention deficit disorder)   . Anemia   . Anemia   . Anxiety   . Arthritis    knee, wrist, back   . Asthma   . Blood transfusion 06/2011   6 units transfused at [facility]  . Bronchitis   . Depression   . Dizziness   . ESRD (end stage renal disease) ([place])    Hemodialysis 3 times a  week - [place]  . Gastritis   . GERD (gastroesophageal reflux disease)   . Gout   . Headache(784.0)   . Hypertension    sees Dr. Wendie Agreste  . Neuromuscular disorder (HCC)    carpal tunnel  . Obesity   . Reflux   . Renal insufficiency   . Shortness of breath   . Sleep apnea    does not use CPAP   Past Surgical History:  Procedure Laterality Date  . AV FISTULA PLACEMENT  06/10/11   Left brachiocephalic AVF  . AV Fistula surgery  11/30/11   Zacarias Pontes - redo fistula  . Carpel tunnel release     x 2; bilateral  . CESAREAN SECTION    . colonscopy    . DILATION AND CURETTAGE OF UTERUS    . HYSTEROSCOPY WITH THERMACHOICE  12/10/2011   Procedure: HYSTEROSCOPY WITH THERMACHOICE;  Surgeon: Betsy Coder, MD;  Location: [facility];  Service: Gynecology;  Laterality: N/A;  . IRRIGATION AND DEBRIDEMENT ABSCESS Left 02/15/2014   Procedure: IRRIGATION AND DRAINAGE OF LOW BACK ABSCESS;  Surgeon: Imogene Burn. Georgette Dover, MD;  Location: [place];  Service: General;  Laterality: Left;  . microdiskectomy  04/2001   Herniated disk L5-S1  . RENAL BIOPSY  04/2004  . REVISON OF  ARTERIOVENOUS FISTULA Left 01/04/2017   Procedure: REVISON OF ARTERIOVENOUS FISTULA; CEPHALIC VEIN TURNDOWN;  Surgeon: Angelia Mould, MD;  Location: [place];  Service: Vascular;  Laterality: Left;  . SHUNTOGRAM  Dec. 18, 2013   Left arm  . SHUNTOGRAM N/A 10/18/2012   Procedure: Earney Mallet;  Surgeon: Conrad Killeen, MD;  Location: [facility] CATH LAB;  Service: Cardiovascular;  Laterality: N/A;  . SHUNTOGRAM Left 04/18/2013   Procedure: Earney Mallet;  Surgeon: Serafina Mitchell, MD;  Location: [facility] CATH LAB;  Service: Cardiovascular;  Laterality: Left;  . SPINE SURGERY  2004   Lumbar diskectomy   . UPPER GASTROINTESTINAL ENDOSCOPY    . WISDOM TOOTH EXTRACTION     Social History   Social History  . Marital status: Married    Spouse name: N/A  . Number of children: N/A  . Years of education: N/A   Social History Main Topics  . Smoking status: Current Every Day Smoker    Packs/day: 0.50    Years: 18.00    Types: Cigarettes  . Smokeless tobacco: Never Used  . Alcohol use Yes     Comment: ocassional  . Drug use: No  .  Sexual activity: Not Currently    Birth control/ protection: Condom   Other Topics Concern  . None   Social History Narrative  . None   Family History  Problem Relation Age of Onset  . Hypertension Mother   . Heart disease Father        Heart Disease before age 68  . Hypertension Father   . Heart attack Father   . Hypertension Daughter    Allergies  Allergen Reactions  . Nsaids Other (See Comments)    Renal failure  . Clarithromycin Nausea And Vomiting  . Demerol Nausea And Vomiting and Other (See Comments)    Severe headaches  . Fentanyl Hives, Nausea And Vomiting, Rash and Other (See Comments)    Reaction to patches only  . Morphine And Related Nausea And Vomiting and Other (See Comments)    Severe headache. p does not want it!  . Other Other (See Comments)    Seeds and nuts- has diverticulitis   . Vancomycin Hives  . Clindamycin/Lincomycin Hives and Rash    Prior to Admission medications   Medication Sig Start Date End Date Taking? Authorizing Provider  albuterol (PROVENTIL HFA;VENTOLIN HFA) 108 (90 BASE) MCG/ACT inhaler Inhale 2 puffs into the lungs every 6 (six) hours as needed for shortness of breath.    Yes [provider]  ALPRAZolam Duanne Moron) 1 MG tablet Take 1 mg by mouth 3 (three) times daily.   Yes [provider]  amLODipine (NORVASC) 10 MG tablet Take 10 mg by mouth at bedtime.  02/08/14  Yes [provider]  amLODipine (NORVASC) 10 MG tablet Take 10 mg by mouth daily. 02/23/17  Yes [provider]  b complex vitamins tablet Take 1 tablet by mouth daily.   Yes [provider]  budesonide-formoterol (SYMBICORT) 160-4.5 MCG/ACT inhaler Inhale 2 puffs into the lungs 2 (two) times daily.   Yes [provider]  buPROPion (WELLBUTRIN SR) 150 MG 12 hr tablet Take 150 mg by mouth daily. 04/15/17  Yes [provider]  calcium acetate (PHOSLO) 667 MG capsule Take 2,001 mg by mouth 2 (two) times daily with a meal.   Yes [provider]  cephALEXin (KEFLEX) 500 MG capsule Take 1 capsule (500 mg total) by mouth 2 (two) times daily. Patient taking differently: Take 500 mg by mouth 2 (two) times daily. Has 8 days remianing. 06/05/17  Yes Malvin Johns, MD  cyclobenzaprine (FLEXERIL) 10 MG tablet Take 20 mg by mouth at bedtime.    Yes [provider]  DULoxetine (CYMBALTA) 60 MG capsule Take 60 mg by mouth daily. 03/30/17  Yes [provider]  furosemide (LASIX) 80 MG tablet Take 80 mg by mouth 2 (two) times daily.    Yes [provider]  hydrALAZINE (APRESOLINE) 50 MG tablet Take 50 mg by mouth 2 (two) times daily.    Yes [provider]  HYDROmorphone (DILAUDID) 2 MG tablet Take 2 mg by mouth every 12 (twelve) hours as needed for severe pain.   Yes [provider]  lidocaine-prilocaine (EMLA) cream Apply 1 application topically See admin  instructions. Apply topically one hour prior to dialysis - Monday, Wednesday, Friday   Yes [provider]  lisinopril (PRINIVIL,ZESTRIL) 40 MG tablet Take 40 mg by mouth daily.   Yes [provider]  Melatonin 5 MG TABS Take 5 mg by mouth at bedtime.    Yes [provider]  methocarbamol (ROBAXIN) 500 MG tablet Take 500 mg by mouth 2 (  two) times daily.  03/22/13  Yes [provider]  montelukast (SINGULAIR) 10 MG tablet Take 10 mg by mouth daily.    Yes [provider]  multivitamin (RENA-VIT) TABS tablet Take 1 tablet by mouth daily.   Yes [provider]  oxyCODONE (OXYCONTIN) 40 mg 12 hr tablet Take 40 mg by mouth every 12 (twelve) hours.   Yes [provider]  promethazine (PHENERGAN) 25 MG tablet Take 25 mg by mouth every 6 (six) hours as needed for nausea.    Yes [provider]  sevelamer carbonate (RENVELA) 800 MG tablet Take 2,400 mg by mouth 2 (two) times daily with a meal.    Yes [provider]  SUMAtriptan (IMITREX) 50 MG tablet Take 50 mg by mouth daily as needed for migraine or headache.  02/14/14   [provider]   Mr Ankle Left Wo Contrast  Result Date: 06/08/2017 CLINICAL DATA:  Ankle pain of uncertain etiology. EXAM: MRI OF THE LEFT ANKLE WITHOUT CONTRAST TECHNIQUE: Multiplanar, multisequence MR imaging of the ankle was performed. No intravenous contrast was administered. COMPARISON:  None. FINDINGS: Exam is limited by patient motion artifacts. TENDONS Peroneal: Intact peroneus longus and peroneus brevis tendons. Posteromedial: A moderate to marked amount of fluid outlines the tibialis posterior tendon with intrasubstance T2 signal noted within the tendon and muscle leading up to the tendon suspicious for tenosynovitis, muscle strain and longitudinal split tear/tendinosis of the tendon (series 6, image 12, series 10 image 12, series 9 image 1 through 12 for example). The flexor digitorum longus  and flexor hallucis longus tendons are intact. Anterior: Intact tibialis anterior, extensor hallucis longus and extensor digitorum longus tendons. Achilles: Intact. Plantar Fascia: Intact. LIGAMENTS Lateral: Grossly intact Medial: Grossly intact CARTILAGE Ankle Joint: No joint effusion or chondral defect. Subtalar Joints/Sinus Tarsi: No joint effusion or chondral defect. Bones: No marrow signal abnormality. No fracture or dislocation. Other: Diffuse soft tissue edema of the included ankle and foot. IMPRESSION: 1. Moderate-to-marked amount of fluid outlining the tibialis posterior tendon with intrasubstance elevated T2 signal suspicious for intrasubstance tear and tenosynovitis. The muscle is also edematous suspicious for a strain or potentially myositis. 2. Diffuse subcutaneous edema about the included ankle and foot consistent with third spacing of fluid or cellulitis. 3. No acute marrow signal abnormalities noted. Electronically Signed   By: Ashley Royalty M.D.   On: 06/08/2017 18:07    Positive ROS: All other systems have been reviewed and were otherwise negative with the exception of those mentioned in the HPI and as above.  Labs cbc  Recent Labs  06/08/17 0233  WBC 12.9*  HGB 8.2*  HCT 26.9*  PLT 299    Labs inflam No results for input(s): CRP in the last 72 hours.  Invalid input(s): ESR  Labs coag No results for input(s): INR, PTT in the last 72 hours.  Invalid input(s): PT   Recent Labs  06/08/17 0233  NA 140  K 3.9  CL 94*  CO2 29  GLUCOSE 154*  BUN 35*  CREATININE 6.96*  CALCIUM 9.8    Physical Exam: Vitals:   06/08/17 1216 06/08/17 1733  BP: (!) 150/83 (!) 151/83  Pulse: 88 96  Resp: 20 12  Temp:     General: Alert, no acute distress Cardiovascular: No pedal edema Respiratory: No cyanosis, no use of accessory musculature GI: No organomegaly, abdomen is soft and non-tender Skin: No lesions in the area of chief complaint other than those listed below in MSK  exam.  Neurologic: Sensation intact distally save for the below mentioned MSK exam Psychiatric: Patient is competent for consent with normal mood and affect Lymphatic: No axillary or cervical lymphadenopathy  MUSCULOSKELETAL:  LLE: compartments soft, erythema and warmth within marked borders. Painless proximal leg and knee. No pain with small arc ankle motion. Global TTP to LLE centered at lower 1/3. She is NVI.  Other extremities are atraumatic with painless ROM and NVI.  Assessment: LLE cellulitis.  Plan: No abscess seen on MRI but motion limits exam Small ankle effusion and injury to PTT.   Currently no indication for surgical drainage/debridment Will follow, if she does not improve we could consider repeat imaging with CT for development of fluid collection and/or ankle aspiration. I am hesitant to suggest ankle aspiration now as she does not have palpable effusion and tap would have to occur through cellulitic skin.   Cont Iv abx per primary. Likely difficulty with pain control and pan TTP is 2/2 her chronic narcotic use.    Renette Butters, MD Cell [phone]   06/08/2017 9:11 PM

## 2017-06-08 NOTE — ED Triage Notes (Signed)
Pt c/o infection to L foot. Was seen here and started on antibiotics earlier in the week. Reports worsening pain and redness. L leg warm to the touch, pt unable to bear weight and has been taking tylenol for pain. Pt also on dialysis

## 2017-06-09 ENCOUNTER — Other Ambulatory Visit (HOSPITAL_COMMUNITY): Payer: Medicare Other

## 2017-06-09 DIAGNOSIS — Z992 Dependence on renal dialysis: Secondary | ICD-10-CM

## 2017-06-09 DIAGNOSIS — R011 Cardiac murmur, unspecified: Secondary | ICD-10-CM

## 2017-06-09 DIAGNOSIS — L03116 Cellulitis of left lower limb: Secondary | ICD-10-CM

## 2017-06-09 DIAGNOSIS — N186 End stage renal disease: Secondary | ICD-10-CM

## 2017-06-09 LAB — CBC
HCT: 25.2 % — ABNORMAL LOW (ref 36.0–46.0)
Hemoglobin: 7.6 g/dL — ABNORMAL LOW (ref 12.0–15.0)
MCH: 29.7 pg (ref 26.0–34.0)
MCHC: 30.2 g/dL (ref 30.0–36.0)
MCV: 98.4 fL (ref 78.0–100.0)
PLATELETS: 295 10*3/uL (ref 150–400)
RBC: 2.56 MIL/uL — ABNORMAL LOW (ref 3.87–5.11)
RDW: 16.8 % — AB (ref 11.5–15.5)
WBC: 13.7 10*3/uL — AB (ref 4.0–10.5)

## 2017-06-09 LAB — RENAL FUNCTION PANEL
ANION GAP: 16 — AB (ref 5–15)
Albumin: 2.8 g/dL — ABNORMAL LOW (ref 3.5–5.0)
BUN: 46 mg/dL — ABNORMAL HIGH (ref 6–20)
CHLORIDE: 96 mmol/L — AB (ref 101–111)
CO2: 28 mmol/L (ref 22–32)
CREATININE: 8.36 mg/dL — AB (ref 0.44–1.00)
Calcium: 9.7 mg/dL (ref 8.9–10.3)
GFR calc non Af Amer: 5 mL/min — ABNORMAL LOW (ref >60)
GFR, EST AFRICAN AMERICAN: 6 mL/min — AB (ref >60)
Glucose, Bld: 102 mg/dL — ABNORMAL HIGH (ref 65–99)
POTASSIUM: 4.4 mmol/L (ref 3.5–5.1)
Phosphorus: 4.7 mg/dL — ABNORMAL HIGH (ref 2.5–4.6)
Sodium: 140 mmol/L (ref 135–145)

## 2017-06-09 LAB — MRSA PCR SCREENING: MRSA by PCR: NEGATIVE

## 2017-06-09 LAB — CREATININE, SERUM
Creatinine, Ser: 8.47 mg/dL — ABNORMAL HIGH (ref 0.44–1.00)
GFR calc non Af Amer: 5 mL/min — ABNORMAL LOW (ref >60)
GFR, EST AFRICAN AMERICAN: 6 mL/min — AB (ref >60)

## 2017-06-09 LAB — HIV ANTIBODY (ROUTINE TESTING W REFLEX): HIV Screen 4th Generation wRfx: NONREACTIVE

## 2017-06-09 MED ORDER — RENA-VITE PO TABS: 1.0000 | ORAL_TABLET | Freq: Every day | ORAL | Status: DC

## 2017-06-09 MED ORDER — SEVELAMER CARBONATE 800 MG PO TABS
2400.0000 mg | ORAL_TABLET | Freq: Two times a day (BID) | ORAL | Status: DC
  Administered 2017-06-09 – 2017-06-11 (×3): 2400 mg via ORAL
  Filled 2017-06-09 (×2): qty 3

## 2017-06-09 MED ORDER — RENA-VITE PO TABS
1.0000 | ORAL_TABLET | Freq: Every day | ORAL | Status: DC
  Administered 2017-06-09 – 2017-06-13 (×6): 1 via ORAL
  Filled 2017-06-09 (×7): qty 1

## 2017-06-09 MED ORDER — OXYCODONE HCL ER 10 MG PO T12A
40.0000 mg | EXTENDED_RELEASE_TABLET | Freq: Two times a day (BID) | ORAL | Status: DC
  Administered 2017-06-09 – 2017-06-14 (×11): 40 mg via ORAL
  Filled 2017-06-09 (×10): qty 4
  Filled 2017-06-09: qty 1
  Filled 2017-06-09: qty 4

## 2017-06-09 MED ORDER — SEVELAMER CARBONATE 800 MG PO TABS
2400.0000 mg | ORAL_TABLET | Freq: Two times a day (BID) | ORAL | Status: DC
  Administered 2017-06-10 – 2017-06-14 (×6): 2400 mg via ORAL
  Filled 2017-06-09 (×11): qty 3

## 2017-06-09 MED ORDER — MONTELUKAST SODIUM 10 MG PO TABS: 10.0000 mg | ORAL_TABLET | Freq: Every day | ORAL | Status: DC

## 2017-06-09 MED ORDER — CALCIUM ACETATE (PHOS BINDER) 667 MG PO CAPS
2001.0000 mg | ORAL_CAPSULE | Freq: Two times a day (BID) | ORAL | Status: DC
  Administered 2017-06-09 – 2017-06-14 (×9): 2001 mg via ORAL
  Filled 2017-06-09 (×12): qty 3

## 2017-06-09 MED ORDER — PENTAFLUOROPROP-TETRAFLUOROETH EX AERO: 1.0000 "application " | INHALATION_SPRAY | CUTANEOUS | Status: DC | PRN

## 2017-06-09 MED ORDER — PROMETHAZINE HCL 25 MG PO TABS
12.5000 mg | ORAL_TABLET | Freq: Four times a day (QID) | ORAL | Status: DC | PRN
  Administered 2017-06-09 – 2017-06-10 (×2): 12.5 mg via ORAL
  Filled 2017-06-09 (×2): qty 1

## 2017-06-09 MED ORDER — AMLODIPINE BESYLATE 10 MG PO TABS
10.0000 mg | ORAL_TABLET | Freq: Every day | ORAL | Status: DC
  Administered 2017-06-09 – 2017-06-13 (×5): 10 mg via ORAL
  Filled 2017-06-09 (×5): qty 1

## 2017-06-09 MED ORDER — ACETAMINOPHEN 325 MG PO TABS
ORAL_TABLET | ORAL | Status: AC
  Filled 2017-06-09: qty 2

## 2017-06-09 MED ORDER — LIDOCAINE-PRILOCAINE 2.5-2.5 % EX CREA: 1.0000 "application " | TOPICAL_CREAM | CUTANEOUS | Status: DC | PRN

## 2017-06-09 MED ORDER — ALBUTEROL SULFATE (2.5 MG/3ML) 0.083% IN NEBU: 2.5000 mg | INHALATION_SOLUTION | Freq: Four times a day (QID) | RESPIRATORY_TRACT | Status: DC | PRN

## 2017-06-09 MED ORDER — ALBUTEROL SULFATE (2.5 MG/3ML) 0.083% IN NEBU: 2.5000 mg | INHALATION_SOLUTION | RESPIRATORY_TRACT | Status: DC | PRN

## 2017-06-09 MED ORDER — METHOCARBAMOL 500 MG PO TABS
500.0000 mg | ORAL_TABLET | Freq: Two times a day (BID) | ORAL | Status: DC
  Administered 2017-06-09 – 2017-06-14 (×9): 500 mg via ORAL
  Filled 2017-06-09 (×10): qty 1

## 2017-06-09 MED ORDER — HYDRALAZINE HCL 20 MG/ML IJ SOLN
5.0000 mg | Freq: Four times a day (QID) | INTRAMUSCULAR | Status: DC | PRN
  Administered 2017-06-10: 5 mg via INTRAVENOUS
  Filled 2017-06-09: qty 1

## 2017-06-09 MED ORDER — ALPRAZOLAM 0.5 MG PO TABS
1.0000 mg | ORAL_TABLET | Freq: Three times a day (TID) | ORAL | Status: DC
  Administered 2017-06-09 – 2017-06-14 (×13): 1 mg via ORAL
  Filled 2017-06-09 (×15): qty 2

## 2017-06-09 MED ORDER — LIDOCAINE HCL (PF) 1 % IJ SOLN
5.0000 mL | INTRAMUSCULAR | Status: DC | PRN
  Filled 2017-06-09: qty 5

## 2017-06-09 MED ORDER — MONTELUKAST SODIUM 10 MG PO TABS
10.0000 mg | ORAL_TABLET | Freq: Every day | ORAL | Status: DC
  Administered 2017-06-09 – 2017-06-13 (×6): 10 mg via ORAL
  Filled 2017-06-09 (×7): qty 1

## 2017-06-09 MED ORDER — SODIUM CHLORIDE 0.9% FLUSH
3.0000 mL | Freq: Two times a day (BID) | INTRAVENOUS | Status: DC
  Administered 2017-06-09 – 2017-06-14 (×10): 3 mL via INTRAVENOUS

## 2017-06-09 MED ORDER — HYDROMORPHONE HCL 1 MG/ML IJ SOLN
1.0000 mg | INTRAMUSCULAR | Status: DC | PRN
  Administered 2017-06-10 (×2): 1 mg via INTRAVENOUS
  Filled 2017-06-09 (×2): qty 1

## 2017-06-09 MED ORDER — SODIUM CHLORIDE 0.9 % IV SOLN: 100.0000 mL | INTRAVENOUS | Status: DC | PRN

## 2017-06-09 MED ORDER — CALCIUM ACETATE (PHOS BINDER) 667 MG PO CAPS
2001.0000 mg | ORAL_CAPSULE | Freq: Two times a day (BID) | ORAL | Status: DC
  Filled 2017-06-09 (×3): qty 3

## 2017-06-09 MED ORDER — HYDROMORPHONE HCL 1 MG/ML IJ SOLN
INTRAMUSCULAR | Status: AC
Start: 2017-06-09 — End: 2017-06-09
  Administered 2017-06-09: 1 mg via INTRAVENOUS
  Filled 2017-06-09: qty 0.5

## 2017-06-09 MED ORDER — HYDROMORPHONE HCL 1 MG/ML IJ SOLN
1.0000 mg | INTRAMUSCULAR | Status: DC | PRN
Start: 2017-06-09 — End: 2017-06-09
  Administered 2017-06-09 (×2): 1 mg via INTRAVENOUS
  Filled 2017-06-09 (×2): qty 1

## 2017-06-09 MED ORDER — ALPRAZOLAM 0.5 MG PO TABS
1.0000 mg | ORAL_TABLET | Freq: Three times a day (TID) | ORAL | Status: DC | PRN
  Administered 2017-06-09: 0.25 mg via ORAL
  Filled 2017-06-09: qty 4

## 2017-06-09 MED ORDER — CYCLOBENZAPRINE HCL 10 MG PO TABS
20.0000 mg | ORAL_TABLET | Freq: Every day | ORAL | Status: DC
  Administered 2017-06-09 – 2017-06-10 (×2): 20 mg via ORAL
  Filled 2017-06-09 (×2): qty 2

## 2017-06-09 MED ORDER — HEPARIN SODIUM (PORCINE) 5000 UNIT/ML IJ SOLN
5000.0000 [IU] | Freq: Three times a day (TID) | INTRAMUSCULAR | Status: DC
  Filled 2017-06-09: qty 1

## 2017-06-09 MED ORDER — ACETAMINOPHEN 325 MG PO TABS
650.0000 mg | ORAL_TABLET | Freq: Four times a day (QID) | ORAL | Status: DC | PRN
  Administered 2017-06-09 (×2): 650 mg via ORAL
  Filled 2017-06-09: qty 2

## 2017-06-09 MED ORDER — ALTEPLASE 2 MG IJ SOLR: 2.0000 mg | Freq: Once | INTRAMUSCULAR | Status: DC | PRN

## 2017-06-09 MED ORDER — HEPARIN SODIUM (PORCINE) 1000 UNIT/ML DIALYSIS: 1000.0000 [IU] | INTRAMUSCULAR | Status: DC | PRN

## 2017-06-09 NOTE — ED Notes (Signed)
Patient requested an ice pack for her left leg

## 2017-06-09 NOTE — Consult Note (Signed)
[facility] CM Primary Care Navigator  06/09/2017  [name] 09/24/1971 [id number]   Met with patient, husband [name]) and sister [name]) at the bedside to identify possible discharge needs.  Patient reportshaving increased pain, swelling, redness and warmth to left foot that had led to this admission. Patient endorses Dr.Pamela Barnett with [facility]) as theprimary care provider.   [facility] [place].   Patient states managing hermedications at home with husband's assistance taking it straight out of the containers.   Patient's husband providestransportation toher doctors'appointments.  Husband is her primary caregiver at home. Sister states that patient's niece [name]) can also provide assistance with care if needed.    Anticipateddischarge plan ishome when improved according to patient. Still awaiting orthopedic consult per sister.  Patient and husband expressedunderstanding to call herprimary care provider's officewhen she gets home,for a post discharge follow-upwithin a week or sooner if needed.Patient letter (with PCP's contact number) was provided as a reminder.  Patient reports having COPD and currently on inhalers for it. Discussed withpatient and husband regarding[facility] CM services available for health management (particularly COPD) and explained to patient about COPD action plan as well. Patient verbalized managing COPD at home and denies any needs or concerns about it.Patient reports she was recommended to see a pulmonologist but has not been. Patient was encouraged to arrange an appointment to see one. Patient was also encouraged to quit smoking.    Patient and husband voicedunderstandingto seek referral from primary care provider to [facility] care management if deemed necessaryfor services in the future.  [facility] care  management information provided for future needs that may arise.   For additional questions please contact:  [name], BSN, RN-BC [facility] PRIMARY CARE Navigator Cell: [phone]

## 2017-06-09 NOTE — Consult Note (Signed)
[facility] Renal Consultation Note    ** NOTE: History/physical performed 8/8 at 3:30pm, unable to be documented until this morning due to computers being down.  Indication for Consultation:  Management of ESRD/hemodialysis, anemia, hypertension/volume, and secondary hyperparathyroidism. PCP:  HPI: [name] is a 46 y.o. female with ESRD, HTN, depression, chronic back pain, and arthritis who was admitted with L ankle cellulitis.  She reports that L ankle pain has been ongoing x 2 weeks, but pain gradually worsened and now with new redness over the past 3 days. Evaluated in ED on 8/5, had negative xrays and given Rx for keflex. Has been taking without improved. Now with fever, chills, generalized aches, malaise, nausea, and vomiting. In ED today, found to have WBC 12.9. She was started on Vancomycin.  From renal standpoint, dialyzes MWF at [facility]. Last HD was 8/6, she is due for HD on 8/8. No AVF issues recently.  Past Medical History:  Diagnosis Date  . ADD (attention deficit disorder)   . Anemia   . Anemia   . Anxiety   . Arthritis    knee, wrist, back   . Asthma   . Blood transfusion 06/2011   6 units transfused at [facility]  . Bronchitis   . Depression   . Dizziness   . ESRD (end stage renal disease) ([place])    Hemodialysis 3 times a  week - [place]  . Gastritis   . GERD (gastroesophageal reflux disease)   . Gout   . Headache(784.0)   . Hypertension    sees Dr. Wendie Agreste  . Neuromuscular disorder (HCC)    carpal tunnel  . Obesity   . Reflux   . Renal insufficiency   . Shortness of breath   . Sleep apnea    does not use CPAP   Past Surgical History:  Procedure Laterality Date  . AV FISTULA PLACEMENT  06/10/11   Left brachiocephalic AVF  . AV Fistula surgery  11/30/11   [facility] - redo fistula  . Carpel tunnel release     x 2; bilateral  . CESAREAN SECTION    . colonscopy    . DILATION AND CURETTAGE OF UTERUS    . HYSTEROSCOPY  WITH THERMACHOICE  12/10/2011   Procedure: HYSTEROSCOPY WITH THERMACHOICE;  Surgeon: Betsy Coder, MD;  Location: [facility];  Service: Gynecology;  Laterality: N/A;  . IRRIGATION AND DEBRIDEMENT ABSCESS Left 02/15/2014   Procedure: IRRIGATION AND DRAINAGE OF LOW BACK ABSCESS;  Surgeon: Imogene Burn. Georgette Dover, MD;  Location: [place];  Service: General;  Laterality: Left;  . microdiskectomy  04/2001   Herniated disk L5-S1  . RENAL BIOPSY  04/2004  . REVISON OF ARTERIOVENOUS FISTULA Left 01/04/2017   Procedure: REVISON OF ARTERIOVENOUS FISTULA; CEPHALIC VEIN TURNDOWN;  Surgeon: Angelia Mould, MD;  Location: [place];  Service: Vascular;  Laterality: Left;  . SHUNTOGRAM  Dec. 18, 2013   Left arm  . SHUNTOGRAM N/A 10/18/2012   Procedure: Earney Mallet;  Surgeon: Conrad , MD;  Location[facility] C[facility] LAB;  Service: Cardiovascular;  Laterality: N/A;  . SHUNTOGRAM Left 04/18/2013   Procedure: Earney Mallet;  Surgeon: Serafina Mitchell, MD;  Location: [facility] C[facility] LAB;  Service: Cardiovascular;  Laterality: Left;  . SPINE SURGERY  2004   Lumbar diskectomy   . UPPER GASTROINTESTINAL ENDOSCOPY    . WISDOM TOOTH EXTRACTION     Family History  Problem Relation Age of Onset  . Hypertension Mother   . Heart disease  Father        Heart Disease before age 36  . Hypertension Father   . Heart attack Father   . Hypertension Daughter    Social History:  reports that she has been smoking Cigarettes.  She has a 9.00 pack-year smoking history. She has never used smokeless tobacco. She reports that she drinks alcohol. She reports that she does not use drugs.  ROS: As per HPI otherwise negative.  Review of Systems: Gen: Denies any fever, chills, fatigue, weakness, malaise, weight loss, and/or sleep disorders. HEENT: Denies blurred vision, sore throat or epistaxis. No sinusitis.   CV: Denies chest pain, angina, palpitations, orthopnea, PND, peripheral edema, and claudication. Resp: Denies dyspnea at rest, dyspnea with exercise,  cough, sputum, or wheezing. GI: Denies nausea, vomiting, abdominal pain, constipation or diarrhea. GU: Denies dysuria or hematuria. MS: Denies joint pain, muscle pain or cramps.  Derm: Denies rashes, itching, or ulcerations. Psych: Denies depression and anxiety. Heme: Denies bruising, bleeding, and enlarged lymph nodes. Neuro: No headache, dizziness, or weakness. Endocrine: No hypoglycemia or thyroid disease.  Physical Exam: Vitals:   06/09/17 0708 06/09/17 0718 06/09/17 0730 06/09/17 0800  BP: (!) 151/94 (!) 162/91 (!) 162/96 (!) 162/97  Pulse: 85 85 86 86  Resp: 15     Temp: 98.6 F (37 C)     TempSrc: Oral     SpO2: 99%     Weight: 66 kg (145 lb 8.1 oz)     Height:         General: Ill appearing female, NAD. Head: Normocephalic, atraumatic, sclera non-icteric, mucus membranes are moist. Neck: Supple without lymphadenopathy/masses. JVD not elevated. Lungs: Clear bilaterally to auscultation without wheezes, rales, or rhonchi.  Heart: RRR, 2/6 systolic murmur. Abdomen: Soft, not distended. Mild generalized tenderness without guarding. Musculoskeletal:  Strength and tone appear normal for age. Lower extremities: Diffuse L ankle edema with erythema and severe tenderness. Neuro: Alert and oriented X 3. Moves all extremities spontaneously. Psych:  Responds to questions appropriately with a normal affect. Dialysis Access: LUE AVF + thrill  Allergies  Allergen Reactions  . Nsaids Other (See Comments)    Renal failure  . Clarithromycin Nausea And Vomiting  . Demerol Nausea And Vomiting and Other (See Comments)    Severe headaches  . Fentanyl Hives, Nausea And Vomiting, Rash and Other (See Comments)    Reaction to patches only  . Morphine And Related Nausea And Vomiting and Other (See Comments)    Severe headache. p does not want it!  . Other Other (See Comments)    Seeds and nuts- has diverticulitis   . Vancomycin Hives  . Clindamycin/Lincomycin Hives and Rash   Prior to  Admission medications   Medication Sig Start Date End Date Taking? Authorizing Provider  albuterol (PROVENTIL HFA;VENTOLIN HFA) 108 (90 BASE) MCG/ACT inhaler Inhale 2 puffs into the lungs every 6 (six) hours as needed for shortness of breath.    Yes [provider]  ALPRAZolam Duanne Moron) 1 MG tablet Take 1 mg by mouth 3 (three) times daily.   Yes [provider]  amLODipine (NORVASC) 10 MG tablet Take 10 mg by mouth at bedtime.  02/08/14  Yes [provider]  amLODipine (NORVASC) 10 MG tablet Take 10 mg by mouth daily. 02/23/17  Yes [provider]  b complex vitamins tablet Take 1 tablet by mouth daily.   Yes [provider]  budesonide-formoterol (SYMBICORT) 160-4.5 MCG/ACT inhaler Inhale 2 puffs into the lungs 2 (two) times daily.  Yes [provider]  buPROPion (WELLBUTRIN SR) 150 MG 12 hr tablet Take 150 mg by mouth daily. 04/15/17  Yes [provider]  calcium acetate (PHOSLO) 667 MG capsule Take 2,001 mg by mouth 2 (two) times daily with a meal.   Yes [provider]  cephALEXin (KEFLEX) 500 MG capsule Take 1 capsule (500 mg total) by mouth 2 (two) times daily. Patient taking differently: Take 500 mg by mouth 2 (two) times daily. Has 8 days remianing. 06/05/17  Yes Malvin Johns, MD  cyclobenzaprine (FLEXERIL) 10 MG tablet Take 20 mg by mouth at bedtime.    Yes [provider]  DULoxetine (CYMBALTA) 60 MG capsule Take 60 mg by mouth daily. 03/30/17  Yes [provider]  furosemide (LASIX) 80 MG tablet Take 80 mg by mouth 2 (two) times daily.    Yes [provider]  hydrALAZINE (APRESOLINE) 50 MG tablet Take 50 mg by mouth 2 (two) times daily.    Yes [provider]  HYDROmorphone (DILAUDID) 2 MG tablet Take 2 mg by mouth every 12 (twelve) hours as needed for severe pain.   Yes [provider]  lidocaine-prilocaine (EMLA) cream Apply 1 application topically See admin instructions.  Apply topically one hour prior to dialysis - Monday, Wednesday, Friday   Yes [provider]  lisinopril (PRINIVIL,ZESTRIL) 40 MG tablet Take 40 mg by mouth daily.   Yes [provider]  Melatonin 5 MG TABS Take 5 mg by mouth at bedtime.    Yes [provider]  methocarbamol (ROBAXIN) 500 MG tablet Take 500 mg by mouth 2 (two) times daily.  03/22/13  Yes [provider]  montelukast (SINGULAIR) 10 MG tablet Take 10 mg by mouth daily.    Yes [provider]  multivitamin (RENA-VIT) TABS tablet Take 1 tablet by mouth daily.   Yes [provider]  oxyCODONE (OXYCONTIN) 40 mg 12 hr tablet Take 40 mg by mouth every 12 (twelve) hours.   Yes [provider]  promethazine (PHENERGAN) 25 MG tablet Take 25 mg by mouth every 6 (six) hours as needed for nausea.    Yes [provider]  sevelamer carbonate (RENVELA) 800 MG tablet Take 2,400 mg by mouth 2 (two) times daily with a meal.    Yes [provider]  SUMAtriptan (IMITREX) 50 MG tablet Take 50 mg by mouth daily as needed for migraine or headache.  02/14/14   [provider]   Current Facility-Administered Medications  Medication Dose Route Frequency Provider Last Rate Last Dose  . 0.9 %  sodium chloride infusion  100 mL Intravenous PRN Loren Racer, PA-C      . 0.9 %  sodium chloride infusion  100 mL Intravenous PRN Stovall, Woodfin Ganja, PA-C      . acetaminophen (TYLENOL) tablet 650 mg  650 mg Oral Q6H PRN Mayo, Pete Pelt, MD      . albuterol (PROVENTIL) (2.5 MG/3ML) 0.083% nebulizer solution 2.5 mg  2.5 mg Nebulization Q4H PRN Sherene Sires, DO      . ALPRAZolam Duanne Moron) tablet 1 mg  1 mg Oral TID Mayo, Pete Pelt, MD      . alteplase (CATHFLO ACTIVASE) injection 2 mg  2 mg Intracatheter Once PRN Loren Racer, PA-C      . calcium acetate (PHOSLO) capsule 2,001 mg  2,001 mg Oral BID WC Mayo, Pete Pelt, MD      . calcium acetate (PHOSLO) capsule 2,001 mg   2,001 mg  Oral BID WC Bland, Scott, DO      . ceFAZolin (ANCEF) IVPB 2g/100 mL premix  2 g Intravenous Q M,W,F-2000 Mayo, Pete Pelt, MD   Stopped at 06/09/17 0131  . cyclobenzaprine (FLEXERIL) tablet 20 mg  20 mg Oral QHS Mayo, Pete Pelt, MD      . heparin injection 1,000 Units  1,000 Units Dialysis PRN Loren Racer, PA-C      . heparin injection 5,000 Units  5,000 Units Subcutaneous Q8H Mayo, Pete Pelt, MD      . hydrALAZINE (APRESOLINE) injection 5 mg  5 mg Intravenous Q6H PRN Mayo, Pete Pelt, MD      . HYDROmorphone (DILAUDID) injection 1 mg  1 mg Intravenous Q3H PRN Mayo, Pete Pelt, MD   1 mg at 06/09/17 4034  . lidocaine (PF) (XYLOCAINE) 1 % injection 5 mL  5 mL Intradermal PRN Loren Racer, PA-C      . lidocaine-prilocaine (EMLA) cream 1 application  1 application Topical PRN Stovall, Woodfin Ganja, PA-C      . methocarbamol (ROBAXIN) tablet 500 mg  500 mg Oral BID Mayo, Pete Pelt, MD      . montelukast (SINGULAIR) tablet 10 mg  10 mg Oral QHS Bland, Scott, DO   10 mg at 06/09/17 7425  . multivitamin (RENA-VIT) tablet 1 tablet  1 tablet Oral QHS Bland, Scott, DO   1 tablet at 06/09/17 9563  . pentafluoroprop-tetrafluoroeth (GEBAUERS) aerosol 1 application  1 application Topical PRN Stovall, Woodfin Ganja, PA-C      . promethazine (PHENERGAN) tablet 12.5 mg  12.5 mg Oral Q6H PRN Mayo, Pete Pelt, MD      . sevelamer carbonate (RENVELA) tablet 2,400 mg  2,400 mg Oral BID WC Mayo, Pete Pelt, MD      . sevelamer carbonate (RENVELA) tablet 2,400 mg  2,400 mg Oral BID WC Bland, Scott, DO      . sodium chloride flush (NS) 0.9 % injection 3 mL  3 mL Intravenous Q12H Sela Hua, MD       Labs: Basic Metabolic Panel:  Recent Labs Lab 06/05/17 1020 06/08/17 0233 06/09/17 0557  NA 138 140 140  K 4.2 3.9 4.4  CL 97* 94* 96*  CO2 26 29 28   GLUCOSE 111* 154* 102*  BUN 40* 35* 46*  CREATININE 8.13* 6.96* 8.36*  8.47*  CALCIUM 9.2 9.8 9.7  PHOS  --   --  4.7*   Liver Function  Tests:  Recent Labs Lab 06/08/17 0233 06/09/17 0557  AST 13*  --   ALT 11*  --   ALKPHOS 94  --   BILITOT 0.8  --   PROT 6.0*  --   ALBUMIN 2.9* 2.8*   CBC:  Recent Labs Lab 06/05/17 1020 06/08/17 0233 06/09/17 0557  WBC 10.2 12.9* 13.7*  NEUTROABS 7.9* 10.6*  --   HGB 7.5* 8.2* 7.6*  HCT 24.2* 26.9* 25.2*  MCV 98.0 97.1 98.4  PLT 235 299 295   Studies/Results: Mr Ankle Left Wo Contrast  Result Date: 06/08/2017 CLINICAL DATA:  Ankle pain of uncertain etiology. EXAM: MRI OF THE LEFT ANKLE WITHOUT CONTRAST TECHNIQUE: Multiplanar, multisequence MR imaging of the ankle was performed. No intravenous contrast was administered. COMPARISON:  None. FINDINGS: Exam is limited by patient motion artifacts. TENDONS Peroneal: Intact peroneus longus and peroneus brevis tendons. Posteromedial: A moderate to marked amount of fluid outlines the tibialis posterior tendon with intrasubstance T2 signal noted within the tendon and muscle leading up  to the tendon suspicious for tenosynovitis, muscle strain and longitudinal split tear/tendinosis of the tendon (series 6, image 12, series 10 image 12, series 9 image 1 through 12 for example). The flexor digitorum longus and flexor hallucis longus tendons are intact. Anterior: Intact tibialis anterior, extensor hallucis longus and extensor digitorum longus tendons. Achilles: Intact. Plantar Fascia: Intact. LIGAMENTS Lateral: Grossly intact Medial: Grossly intact CARTILAGE Ankle Joint: No joint effusion or chondral defect. Subtalar Joints/Sinus Tarsi: No joint effusion or chondral defect. Bones: No marrow signal abnormality. No fracture or dislocation. Other: Diffuse soft tissue edema of the included ankle and foot. IMPRESSION: 1. Moderate-to-marked amount of fluid outlining the tibialis posterior tendon with intrasubstance elevated T2 signal suspicious for intrasubstance tear and tenosynovitis. The muscle is also edematous suspicious for a strain or potentially  myositis. 2. Diffuse subcutaneous edema about the included ankle and foot consistent with third spacing of fluid or cellulitis. 3. No acute marrow signal abnormalities noted. Electronically Signed   By: Ashley Royalty M.D.   On: 06/08/2017 18:07    Dialysis Orders:  MWF at [facility] 3:30hr, BFR 400, DFR A1.5, EDW 65.5kg, 2K/2Ca bath, AVF, no heparin - Mircera 131mcg IV q 2 weeks - Venofer 50mg  IV weekly - BMM meds: Phoslo 3/meals AND Renvela 3/meals. No VDRA.   Assessment/Plan: 1.  Sepsis/L ankle cellulitis (+/- septic joint): Failed PO meds, now on IV Vancomycin. MRI ankle without osteomyelitis. Orthopedics consult pending. BCx 8/8 pending. 2.  ESRD: Usual MWF schedule, will dialyze 8/9 to make up for missed Wed HD. No heparin. 3.  Hypertension/volume: BP slightly high, UF as tolerated. No need to resume outpt lasix, but can likely add back in home meds as tolerated by BP. 4.  Anemia: Hgb 7.6. Will re-dose ESA, Aranesp 269mcg today. 5.  Metabolic bone disease: Ca/Phos ok. Continue Phoslo/Renvela. 6.  Nutrition: Alb 2.8, adding Nepro supps. 7. ?New murmur: Echo pending, per primary. 8. Chronic back pain: Home narcotics per primary. 9. Depression/anxiety: Home meds per primary.  Veneta Penton, PA-C 06/09/2017, 8:27 AM  [facility] Pager: [phone]  Pt seen, examined and agree w A/P as above. ESRD pt with several day hx of L ankle / lower leg redness, pain and fevers/ chills at home.  Admitted , on IV abx, possible septic joint.  Plan HD tomorrow.  Jacolyn Splinter MD [employer] pager [phone]   06/09/2017, 9:23 AM

## 2017-06-09 NOTE — Progress Notes (Signed)
Family Medicine Teaching Service Daily Progress Note Intern Pager: [phone]  Patient name: [name] Medical record number: [id number] Date of birth: 10/27/1971 Age: 46 y.o. Gender: female  Primary Care Provider: Greig Right, MD Consultants: ortho Code Status: full  Pt Overview and Major Events to Date:  [name] is a 46 y.o. female presenting with left ankle pain and swelling. PMH is significant for ESRD on HD, HTN, bronchitis, anxiety/depression, chronic low back pain.  MRI showed potential tear to tibialis tendon and fluid surrounding joint but no marrow process.  Assessment and Plan: [name] is a 46 y.o. female presenting with left ankle pain and swelling. PMH is significant for ESRD on HD, HTN, bronchitis, anxiety/depression, chronic low back pain.  Sepsis with left ankle pain: Meeting SIRS criteria with HR >90 and WBC > 12. Concern for septic arthritis, given that she has edema and excruciating pain of the left ankle. She is unable to move her ankle on exam. Other differentials include osteomyelitis vs cellulitis. No open wounds. Think infectious cause is most likely with subjective fevers and chills at home, warmth of skin, and mildly elevated WBC of 12.9. Other differentials include gout, as patient has history of this, although it typically affects her hands/elbows and does not typically cause infectious symptoms. This could also be a soft tissue injury due to her fall, although that would not explain the cellulitic appearance of her skin. DVT less likely with negative duplex US on 8/5. - Admit to telemetry under inpatient status, attending Dr. Ardelia Mems. - Ancef for ID - Blood cultures ordered - MRI showed potential tear to tibialis tendon and fluid surrounding joint but no marrow process. - Ortho consulted (Dr. Percell Miller). Will see her this afternoon. Appreciate their assistance. - Dilaudid 1mg  q3hrs prn, home dose oxy and Tylenol for pain -  Hold off on fluids for now, as BP is mildly hypertensive and patient is ESRD - Repeat CBC and renal function panel in the morning  New systolic murmur: III/VI crescendo-decrescendo murmur heard loudest in the LUSB. Given septic arthritis vs cellulitis on the differential, concern for bacterial endocarditis. Could also be radiating from her AV fistula. Patient does not have a history of IV drug use. - ECHO ordered - Blood cultures pending  ESRD on HD: Nephrologist is Dr. Justin Mend. MWF in [place]. Last HD session was Monday and got the full session. Has been on HD since 2013. 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 K 3.9 and patient is not fluid overloaded, so no indication for emergent HD. -nephro scheduled HD (8/9) - Continue home Phoslo 2,001mg  bid with meals, Renvela 2,400mg  bid with meals, and Rena-vit daily - Unclear why she is on Lasix 80mg  bid since she is ESRD. Will hold for now. Appreciate Nephrology input.  HTN: BP 140/90 in the ED. - Hold home Hydralazine 50mg  tid, Lisinopril 40mg  daily, and while septic -restart Norvasc 10mg  daily -per nephro, no need to restart lasix - Will add Hydralazine IV prn for SBP > 160  Anemia in CKD: Hgb 8.2 in the ED. Baseline 9-11. - Trend CBCs - Appreciate any recommendations per Nephro.  ?Bronchitis: Unclear if she has had PFTs in the past, but she smokes 1/2 ppd. Per patient, she does not have a diagnosis of COPD. Breathing comfortably on room air in the ED. - discuss formulary equivalent to home Symbicort 2 puffs bid,  -continue Albuterol 2 puffs q6hrs prn, and Singulair 10mg  daily  Anxiety:  Takes Xanax tid prn at home. Patient appears anxious on exam and states she is worried about what is going on with her ankle. - Continue home Xanax  Chronic Low Back Pain: Takes Dilaudid 2mg  bid prn and Oxycodone 40mg  bid at home. Follows with a pain clinic in [place]. - Switch to Dilaudid IV while having acute pain,  start home dose oxy -hold Robaxin 500mg  bid and Flexeril 20mg  qhs  FEN/GI: Renal diet with fluid restriction. Prophylaxis: Heparin sq  Disposition: Admit to telemetry under inpatient status. Discharge home pending clinical improvement.  Subjective:  Patient away from room at HD during rounds  Objective: Temp:  [98 F (36.7 C)-98.7 F (37.1 C)] 98 F (36.7 C) (08/09 1053) Pulse Rate:  [85-96] 86 (08/09 1053) Resp:  [12-20] 15 (08/09 1053) BP: (130-162)/(76-97) 157/90 (08/09 1053) SpO2:  [93 %-100 %] 99 % (08/09 1053) Weight:  [143 lb 4.8 oz (65 kg)-145 lb 8.1 oz (66 kg)] 143 lb 4.8 oz (65 kg) (08/09 1053) Physical Exam: Patient away from room at HD during rounds  Laboratory:  Recent Labs Lab 06/05/17 1020 06/08/17 0233 06/09/17 0557  WBC 10.2 12.9* 13.7*  HGB 7.5* 8.2* 7.6*  HCT 24.2* 26.9* 25.2*  PLT 235 299 295    Recent Labs Lab 06/05/17 1020 06/08/17 0233 06/09/17 0557  NA 138 140 140  K 4.2 3.9 4.4  CL 97* 94* 96*  CO2 26 29 28   BUN 40* 35* 46*  CREATININE 8.13* 6.96* 8.36*  8.47*  CALCIUM 9.2 9.8 9.7  PROT  --  6.0*  --   BILITOT  --  0.8  --   ALKPHOS  --  94  --   ALT  --  11*  --   AST  --  13*  --   GLUCOSE 111* 154* 102*      Imaging/Diagnostic Tests: Mr Ankle Left Wo Contrast  Result Date: 06/08/2017 CLINICAL DATA:  Ankle pain of uncertain etiology. EXAM: MRI OF THE LEFT ANKLE WITHOUT CONTRAST TECHNIQUE: Multiplanar, multisequence MR imaging of the ankle was performed. No intravenous contrast was administered. COMPARISON:  None. FINDINGS: Exam is limited by patient motion artifacts. TENDONS Peroneal: Intact peroneus longus and peroneus brevis tendons. Posteromedial: A moderate to marked amount of fluid outlines the tibialis posterior tendon with intrasubstance T2 signal noted within the tendon and muscle leading up to the tendon suspicious for tenosynovitis, muscle strain and longitudinal split tear/tendinosis of the tendon (series 6, image  12, series 10 image 12, series 9 image 1 through 12 for example). The flexor digitorum longus and flexor hallucis longus tendons are intact. Anterior: Intact tibialis anterior, extensor hallucis longus and extensor digitorum longus tendons. Achilles: Intact. Plantar Fascia: Intact. LIGAMENTS Lateral: Grossly intact Medial: Grossly intact CARTILAGE Ankle Joint: No joint effusion or chondral defect. Subtalar Joints/Sinus Tarsi: No joint effusion or chondral defect. Bones: No marrow signal abnormality. No fracture or dislocation. Other: Diffuse soft tissue edema of the included ankle and foot. IMPRESSION: 1. Moderate-to-marked amount of fluid outlining the tibialis posterior tendon with intrasubstance elevated T2 signal suspicious for intrasubstance tear and tenosynovitis. The muscle is also edematous suspicious for a strain or potentially myositis. 2. Diffuse subcutaneous edema about the included ankle and foot consistent with third spacing of fluid or cellulitis. 3. No acute marrow signal abnormalities noted. Electronically Signed   By: Ashley Royalty M.D.   On: 06/08/2017 18:07     Sherene Sires, DO 06/09/2017, 11:04 AM PGY-1, [place] Intern  pager: [phone], text pages welcome

## 2017-06-09 NOTE — Progress Notes (Signed)
Family Medicine Teaching Service Daily Progress Note Intern Pager: [phone]  Patient name: [name] Medical record number: [id number] Date of birth: [birth year] Age: 46 y.o. Gender: female  Primary Care Provider: Greig Right, MD Consultants: ortho Code Status: full  Pt Overview and Major Events to Date:  [name] a 46 y.o.femalepresenting with left ankle pain and swelling. PMH is significant for ESRD on HD, HTN, bronchitis, anxiety/depression, chronic low back pain.  MRI showed potential tear to tibialis tendon/tendosynovitis and fluid surrounding joint but no marrow process.  Assessment and Plan: [name] a 46 y.o.femalepresenting with left ankle pain and swelling. PMH is significant for ESRD on HD, HTN, bronchitis, anxiety/depression, chronic low back pain.  Sepsis with left ankle pain: Concern for septic arthritis, given that she has edema and excruciating pain of the left ankle. She is unable to move her ankle on exam. Other differentials include osteomyelitis vs cellulitis. No open wounds. Think infectious cause is most likely with subjective fevers and chills at home, warmth of skin, and mildly elevated WBC of 13.8.  This could also be a soft tissue injury due to her fall, although that would not explain the cellulitic appearance of her skin. DVT less likely with negative duplex US on 8/5. - Admit to telemetry under inpatient status, attending Dr. Ardelia Mems. - Ancef per ID (if ortho is not going to intervene, discuss abx changes) - Blood cultures ordered (no growth @1day ) - MRI showed potential tear to tibialis tendon and fluid surrounding joint but no marrow process. - Ortho consulted (Dr. Percell Miller). No exam 8/9 because patient was at HD, hopefully 8/10.  Appreciate their assistance. - Dilaudid 1mg  q3hrs prn, home dose oxycontin and Tylenol for pain - Hold off on fluids for now, as BP is hypertensive and patient is ESRD - Repeat CBC and  renal function panel in the morning  systolic murmur: III/VI crescendo-decrescendo murmur heard loudest in the LUSB. Given septic arthritis vs cellulitis on the differential, concern for bacterial endocarditis. Could also be radiating from her AV fistula.Patient does not have a history of IV drug use. - ECHO canceled- murmur believed to be referred bruit from fistula - Blood cultures pending  ESRD on HD: Nephrologist is Dr. Justin Mend. MWF in [place]. Last HD session was Monday and got the full session. Has been on HD since 2013. 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 K 3.9 and patient is not fluid overloaded, so no indication for emergent HD. -last HD 8/9, next 8/11 -Continue home Phoslo 2,001mg  bid with meals, Renvela 2,400mg  bid with meals, and Rena-vit daily -No outpatient lasix per nephro. Appreciate input.  HTN: BP 140/90 in the ED. - Hold home Hydralazine 50mg  tid, Lisinopril 40mg  daily, will restart one 8/10 if hypertensive overnight on norvasc -restart Norvasc 10mg  daily -per nephro, no need to restart lasix - Will add Hydralazine IV prn for SBP > 160  Anemia in CKD: Hgb 6.9 on 8/10. Baseline 9-11. -discuss potential transfusion, patient consents to transfusion if team thinks it is appropriate - Trend CBCs - Appreciate any recommendations per Nephro.  ?Bronchitis: Unclear if she has had PFTs in the past, but she smokes 1/2 ppd. Per patient, she does not have a diagnosis of COPD. Breathing comfortably on room air in the ED. - discuss home symbicort or ordering inpatient dulera with patient -continue Albuterol 2 puffs q6hrs prn, and Singulair 10mg  daily  Anxiety: Takes Xanax tid prn at home. Patient appears anxious on  exam and states she is worried about what is going on with her ankle. - Continue home Xanax  Chronic Low Back Pain: Takes Dilaudid 2mg  bid prn and Oxycodone 40mg  bid at home. Follows with a pain clinic in [place]. - Switch to  Dilaudid IV while having acute pain, start home dose oxy -hold Robaxin 500mg  bid and Flexeril 20mg  qhs  FEN/GI: Renal diet with fluid restriction. Prophylaxis: Heparin sq  Disposition:Admit to telemetry under inpatient status. Discharge home pending clinical improvement.  Subjective:  Patient says pain in her leg is severe and even distracts her from sleep.   When discussing anemia, she states that she has felt short of breath overnight  Objective: Temp:  [98 F (36.7 C)-103 F (39.4 C)] 100.5 F (38.1 C) (08/10 0539) Pulse Rate:  [86-106] 102 (08/10 0540) Resp:  [15-20] 19 (08/10 0539) BP: (130-179)/(82-95) 163/84 (08/10 0540) SpO2:  [95 %-100 %] 100 % (08/10 0540) Weight:  [143 lb 4.8 oz (65 kg)] 143 lb 4.8 oz (65 kg) (08/09 1053) Physical Exam: General: Tearful, tired-appearing Eyes: PERRLA, EOMI, no scleral icterus Cardiovascular: RRR, 3/6 systolicmurmur  Respiratory: CTAB, normal work of breathing Gastrointestinal: +BS, soft, non-tender, non-distended MSK: Two AV fistula present in the left upper extremity left ankle; left ankle is warm to the touch Derm: No rashes or lesions other than erythema of the left ankle, no open wounds Neuro: Awake, alert, oriented x 3, moving all extremities, no gross deficits. Psych: Appears tearful and anxious, appropriate processing of conversation  Laboratory:  Recent Labs Lab 06/08/17 0233 06/09/17 0557 06/10/17 0404  WBC 12.9* 13.7* 13.8*  HGB 8.2* 7.6* 6.9*  HCT 26.9* 25.2* 22.1*  PLT 299 295 252    Recent Labs Lab 06/08/17 0233 06/09/17 0557 06/10/17 0404  NA 140 140 138  K 3.9 4.4 3.7  CL 94* 96* 96*  CO2 29 28 29   BUN 35* 46* 24*  CREATININE 6.96* 8.36*  8.47* 5.29*  CALCIUM 9.8 9.7 9.6  PROT 6.0*  --   --   BILITOT 0.8  --   --   ALKPHOS 94  --   --   ALT 11*  --   --   AST 13*  --   --   GLUCOSE 154* 102* 83      Imaging/Diagnostic Tests: Mr Ankle Left Wo Contrast  Result Date: 06/08/2017 CLINICAL  DATA:  Ankle pain of uncertain etiology. EXAM: MRI OF THE LEFT ANKLE WITHOUT CONTRAST TECHNIQUE: Multiplanar, multisequence MR imaging of the ankle was performed. No intravenous contrast was administered. COMPARISON:  None. FINDINGS: Exam is limited by patient motion artifacts. TENDONS Peroneal: Intact peroneus longus and peroneus brevis tendons. Posteromedial: A moderate to marked amount of fluid outlines the tibialis posterior tendon with intrasubstance T2 signal noted within the tendon and muscle leading up to the tendon suspicious for tenosynovitis, muscle strain and longitudinal split tear/tendinosis of the tendon (series 6, image 12, series 10 image 12, series 9 image 1 through 12 for example). The flexor digitorum longus and flexor hallucis longus tendons are intact. Anterior: Intact tibialis anterior, extensor hallucis longus and extensor digitorum longus tendons. Achilles: Intact. Plantar Fascia: Intact. LIGAMENTS Lateral: Grossly intact Medial: Grossly intact CARTILAGE Ankle Joint: No joint effusion or chondral defect. Subtalar Joints/Sinus Tarsi: No joint effusion or chondral defect. Bones: No marrow signal abnormality. No fracture or dislocation. Other: Diffuse soft tissue edema of the included ankle and foot. IMPRESSION: 1. Moderate-to-marked amount of fluid outlining the tibialis posterior tendon with intrasubstance  elevated T2 signal suspicious for intrasubstance tear and tenosynovitis. The muscle is also edematous suspicious for a strain or potentially myositis. 2. Diffuse subcutaneous edema about the included ankle and foot consistent with third spacing of fluid or cellulitis. 3. No acute marrow signal abnormalities noted. Electronically Signed   By: Ashley Royalty M.D.   On: 06/08/2017 18:07     Sherene Sires, DO 06/10/2017, 8:54 AM PGY-1, [place] Intern pager: [phone], text pages welcome

## 2017-06-09 NOTE — Consult Note (Signed)
Patient off the floor this AM, will attempt again to perform formal c/s

## 2017-06-09 NOTE — Progress Notes (Signed)
Attempted to receive report and left phone number with ED.

## 2017-06-09 NOTE — Progress Notes (Addendum)
[facility] Progress Note  Subjective: no new c/o  Vitals:   06/09/17 0930 06/09/17 1000 06/09/17 1030 06/09/17 1053  BP: (!) 144/88 (!) 156/95 (!) 130/94 (!) 157/90  Pulse: 89 91 96 86  Resp:    15  Temp:    98 F (36.7 C)  TempSrc:    Oral  SpO2:    99%  Weight:    65 kg (143 lb 4.8 oz)  Height:        Inpatient medications: . ALPRAZolam  1 mg Oral TID  . amLODipine  10 mg Oral QHS  . calcium acetate  2,001 mg Oral BID WC  . calcium acetate  2,001 mg Oral BID WC  . cyclobenzaprine  20 mg Oral QHS  . heparin  5,000 Units Subcutaneous Q8H  . methocarbamol  500 mg Oral BID  . montelukast  10 mg Oral QHS  . multivitamin  1 tablet Oral QHS  . oxyCODONE  40 mg Oral Q12H  . sevelamer carbonate  2,400 mg Oral BID WC  . sevelamer carbonate  2,400 mg Oral BID WC  . sodium chloride flush  3 mL Intravenous Q12H   .  ceFAZolin (ANCEF) IV Stopped (06/09/17 0131)   acetaminophen, albuterol, hydrALAZINE, HYDROmorphone (DILAUDID) injection, promethazine  Exam: General: Ill appearing female, NAD. Head: Normocephalic, atraumatic, sclera non-icteric, mucus membranes are moist. Neck: Supple without lymphadenopathy/masses. JVD not elevated. Lungs: Clear bilaterally to auscultation without wheezes, rales, or rhonchi.  Heart: RRR, no murmur if AVF is transiently occluded; this is transmitted bruit from the L arm AVF Abdomen: Soft, not distended. Mild generalized tenderness without guarding. Musculoskeletal:  Strength and tone appear normal for age. Lower extremities: Diffuse L ankle edema with erythema and severe tenderness. Neuro: Alert and oriented X 3. Moves all extremities spontaneously. Psych:  Responds to questions appropriately with a normal affect. Dialysis Access: LUE AVF + thrill  Dialysis Orders:  MWF at [facility] 3:30hr, BFR 400, DFR A1.5, EDW 65.5kg, 2K/2Ca bath, AVF, no heparin - Mircera 144mcg IV q 2 weeks - Venofer 50mg  IV weekly - BMM meds: Phoslo  3/meals AND Renvela 3/meals. No VDRA.   Assessment/Plan: 1. Sepsis/L ankle cellulitis (+/- septic joint): Failed PO meds, now on IV Vancomycin. MRI ankle without osteomyelitis. Orthopedics consult pending. BCx 8/8 pending. 2. ESRD: Usual MWF schedule; on HD today to make up for missed Wed HD. No heparin. 3. Hypertension/volume: BP slightly high, UF as tolerated. No need to resume outpt lasix 4. Anemia: Hgb 7.6. Will re-dose ESA, Aranesp 226mcg today. 5. Metabolic bone disease: Ca/Phos ok. Continue Phoslo/Renvela. 6. Nutrition: Alb 2.8, adding Nepro supps. 7. New murmur:  this is the transmitted loud bruit coming from her LUA fistula, not a heart murmur.  When AVF is occluded briefly, she has no heart murmur.  8. Chronic back pain: Home narcotics per primary. 9. Depression/anxiety: Home meds per primary.   Plan - HD today and again tomorrow.    Gaetana Splinter MD [facility] pager [phone]   06/09/2017, 12:18 PM    Recent Labs Lab 06/05/17 1020 06/08/17 0233 06/09/17 0557  NA 138 140 140  K 4.2 3.9 4.4  CL 97* 94* 96*  CO2 26 29 28   GLUCOSE 111* 154* 102*  BUN 40* 35* 46*  CREATININE 8.13* 6.96* 8.36*  8.47*  CALCIUM 9.2 9.8 9.7  PHOS  --   --  4.7*    Recent Labs Lab 06/08/17 0233 06/09/17 0557  AST 13*  --  ALT 11*  --   ALKPHOS 94  --   BILITOT 0.8  --   PROT 6.0*  --   ALBUMIN 2.9* 2.8*    Recent Labs Lab 06/05/17 1020 06/08/17 0233 06/09/17 0557  WBC 10.2 12.9* 13.7*  NEUTROABS 7.9* 10.6*  --   HGB 7.5* 8.2* 7.6*  HCT 24.2* 26.9* 25.2*  MCV 98.0 97.1 98.4  PLT 235 299 295   Iron/TIBC/Ferritin/ %Sat    Component Value Date/Time   IRON 14 (L) 02/14/2014 2025   TIBC 149 (L) 02/14/2014 2025   FERRITIN 1,167 (H) 02/14/2014 2025   IRONPCTSAT 9 (L) 02/14/2014 2025

## 2017-06-10 ENCOUNTER — Inpatient Hospital Stay (HOSPITAL_COMMUNITY): Payer: Medicare Other

## 2017-06-10 DIAGNOSIS — D631 Anemia in chronic kidney disease: Secondary | ICD-10-CM

## 2017-06-10 LAB — BASIC METABOLIC PANEL
ANION GAP: 13 (ref 5–15)
BUN: 24 mg/dL — ABNORMAL HIGH (ref 6–20)
CHLORIDE: 96 mmol/L — AB (ref 101–111)
CO2: 29 mmol/L (ref 22–32)
CREATININE: 5.29 mg/dL — AB (ref 0.44–1.00)
Calcium: 9.6 mg/dL (ref 8.9–10.3)
GFR calc non Af Amer: 9 mL/min — ABNORMAL LOW (ref >60)
GFR, EST AFRICAN AMERICAN: 10 mL/min — AB (ref >60)
Glucose, Bld: 83 mg/dL (ref 65–99)
POTASSIUM: 3.7 mmol/L (ref 3.5–5.1)
SODIUM: 138 mmol/L (ref 135–145)

## 2017-06-10 LAB — PREPARE RBC (CROSSMATCH)

## 2017-06-10 LAB — CBC WITH DIFFERENTIAL/PLATELET
BASOS ABS: 0 10*3/uL (ref 0.0–0.1)
BASOS PCT: 0 %
EOS ABS: 0 10*3/uL (ref 0.0–0.7)
Eosinophils Relative: 0 %
HCT: 22.1 % — ABNORMAL LOW (ref 36.0–46.0)
HEMOGLOBIN: 6.9 g/dL — AB (ref 12.0–15.0)
Lymphocytes Relative: 13 %
Lymphs Abs: 1.8 10*3/uL (ref 0.7–4.0)
MCH: 30.1 pg (ref 26.0–34.0)
MCHC: 31.2 g/dL (ref 30.0–36.0)
MCV: 96.5 fL (ref 78.0–100.0)
Monocytes Absolute: 0.7 10*3/uL (ref 0.1–1.0)
Monocytes Relative: 5 %
NEUTROS ABS: 11.2 10*3/uL — AB (ref 1.7–7.7)
NEUTROS PCT: 81 %
Platelets: 252 10*3/uL (ref 150–400)
RBC: 2.29 MIL/uL — AB (ref 3.87–5.11)
RDW: 16.3 % — ABNORMAL HIGH (ref 11.5–15.5)
WBC: 13.8 10*3/uL — AB (ref 4.0–10.5)

## 2017-06-10 LAB — URIC ACID: Uric Acid, Serum: 3.4 mg/dL (ref 2.3–6.6)

## 2017-06-10 MED ORDER — METOPROLOL TARTRATE 12.5 MG HALF TABLET
12.5000 mg | ORAL_TABLET | Freq: Two times a day (BID) | ORAL | Status: DC
  Administered 2017-06-10 – 2017-06-11 (×4): 12.5 mg via ORAL
  Filled 2017-06-10 (×4): qty 1

## 2017-06-10 MED ORDER — HEPARIN SODIUM (PORCINE) 1000 UNIT/ML DIALYSIS: 1000.0000 [IU] | INTRAMUSCULAR | Status: DC | PRN

## 2017-06-10 MED ORDER — SODIUM CHLORIDE 0.9 % IV SOLN: 100.0000 mL | INTRAVENOUS | Status: DC | PRN

## 2017-06-10 MED ORDER — SODIUM CHLORIDE 0.9 % IV SOLN
Freq: Once | INTRAVENOUS | Status: AC
  Administered 2017-06-10: 13:00:00 via INTRAVENOUS

## 2017-06-10 MED ORDER — COLCHICINE 0.6 MG PO TABS
0.3000 mg | ORAL_TABLET | Freq: Once | ORAL | Status: AC
  Administered 2017-06-10: 0.3 mg via ORAL
  Filled 2017-06-10: qty 1

## 2017-06-10 MED ORDER — ALTEPLASE 2 MG IJ SOLR: 2.0000 mg | Freq: Once | INTRAMUSCULAR | Status: DC | PRN

## 2017-06-10 MED ORDER — HYDROMORPHONE HCL 1 MG/ML IJ SOLN
1.0000 mg | INTRAMUSCULAR | Status: DC | PRN
  Administered 2017-06-10 – 2017-06-12 (×18): 1 mg via INTRAVENOUS
  Filled 2017-06-10 (×17): qty 1

## 2017-06-10 MED ORDER — LIDOCAINE HCL (PF) 1 % IJ SOLN: 5.0000 mL | INTRAMUSCULAR | Status: DC | PRN

## 2017-06-10 MED ORDER — ACETAMINOPHEN 325 MG PO TABS
650.0000 mg | ORAL_TABLET | Freq: Once | ORAL | Status: AC | PRN
Start: 2017-06-10 — End: 2017-06-11
  Administered 2017-06-11: 650 mg via ORAL
  Filled 2017-06-10: qty 2

## 2017-06-10 MED ORDER — ALPRAZOLAM 0.5 MG PO TABS
ORAL_TABLET | ORAL | Status: AC
  Administered 2017-06-10: 1 mg via ORAL
  Filled 2017-06-10: qty 2

## 2017-06-10 MED ORDER — ACETAMINOPHEN 325 MG PO TABS
650.0000 mg | ORAL_TABLET | Freq: Three times a day (TID) | ORAL | Status: DC
  Administered 2017-06-10 – 2017-06-12 (×7): 650 mg via ORAL
  Filled 2017-06-10 (×6): qty 2

## 2017-06-10 MED ORDER — ACETAMINOPHEN 325 MG PO TABS: 650.0000 mg | ORAL_TABLET | Freq: Three times a day (TID) | ORAL | Status: DC

## 2017-06-10 MED ORDER — ACETAMINOPHEN 325 MG PO TABS
ORAL_TABLET | ORAL | Status: AC
  Administered 2017-06-10: 650 mg via ORAL
  Filled 2017-06-10: qty 2

## 2017-06-10 MED ORDER — HYDROMORPHONE HCL 1 MG/ML IJ SOLN
INTRAMUSCULAR | Status: AC
  Administered 2017-06-10: 1 mg via INTRAVENOUS
  Filled 2017-06-10: qty 1

## 2017-06-10 MED ORDER — NEPRO/CARBSTEADY PO LIQD: 237.0000 mL | Freq: Three times a day (TID) | ORAL | Status: DC

## 2017-06-10 MED ORDER — LIDOCAINE-PRILOCAINE 2.5-2.5 % EX CREA: 1.0000 "application " | TOPICAL_CREAM | CUTANEOUS | Status: DC | PRN

## 2017-06-10 MED ORDER — DARBEPOETIN ALFA 200 MCG/0.4ML IJ SOSY
200.0000 ug | PREFILLED_SYRINGE | INTRAMUSCULAR | Status: DC
  Administered 2017-06-10: 200 ug via INTRAVENOUS

## 2017-06-10 MED ORDER — DARBEPOETIN ALFA 200 MCG/0.4ML IJ SOSY
PREFILLED_SYRINGE | INTRAMUSCULAR | Status: AC
  Administered 2017-06-10: 200 ug via INTRAVENOUS
  Filled 2017-06-10: qty 0.4

## 2017-06-10 MED ORDER — PENTAFLUOROPROP-TETRAFLUOROETH EX AERO: 1.0000 "application " | INHALATION_SPRAY | CUTANEOUS | Status: DC | PRN

## 2017-06-10 NOTE — Progress Notes (Signed)
C/o persistent LLE pain. She has a h/o gout Improved erythema on exam  Will check uric acid and single does of colchicine to see if it improves her pain/inflimation    Kayela Humphres D

## 2017-06-10 NOTE — Progress Notes (Signed)
Spoke with Dr. Percell Miller of orthopedics and Dr. Tommy Medal of ID regarding further treatment for [name].  Dr. Percell Miller is not currently worried about her tenosynovitis and torn tendon, saying that it is likely nonoperable and it is most likely not infected.  However, if the swelling becomes clinically worse, that could indicate that her tendon has become seeded with bacteria.  Someone from their team will see her this weekend, and if there appears to be an effusion, they may wash it out on Monday.    Dr. Tommy Medal advised that we continue with Ancef rather than broaden antibiotic coverage because her clinical picture has not worsened, and symptoms are likely due to cellulitis overlying a tendon injury.

## 2017-06-10 NOTE — Progress Notes (Signed)
Dialysis treatment completed.  1500 mL ultrafiltrated and net fluid removal 0 mL.    Patient status unchanged. Lung sounds diminished to ausculation in all fields. RLE edema. Cardiac: NSR.  Disconnected lines and removed needles.  Pressure held for 10 minutes and band aid/gauze dressing applied.  Report given to bedside RN, [place].

## 2017-06-10 NOTE — Progress Notes (Signed)
Patient arrived to unit per bed.  Reviewed treatment plan and this RN agrees.  Report received from bedside RN, [place].  Consent verified.  Patient A & O X 4. Lung sounds diminished and clear to ausculation in all fields. RLE edema. Cardiac: NSR.  Prepped LUAVF with alcohol and cannulated with two 15 gauge needles.  Pulsation of blood noted.  Flushed access well with saline per protocol.  Connected and secured lines and initiated tx at 1938.  UF goal of 1500 mL and net fluid removal of 0 mL.  Will continue to monitor.

## 2017-06-10 NOTE — Progress Notes (Signed)
[facility] Progress Note  Subjective: no new c/o  Vitals:   06/09/17 2233 06/10/17 0200 06/10/17 0539 06/10/17 0540  BP: (!) 178/85 (!) 175/85 (!) 162/83 (!) 163/84  Pulse: 88 95 (!) 106 (!) 102  Resp: 18 18 19    Temp: (!) 100.7 F (38.2 C) 99.7 F (37.6 C) (!) 100.5 F (38.1 C)   TempSrc: Oral Oral Oral   SpO2: 95% 100% 97% 100%  Weight:      Height:        Inpatient medications: . acetaminophen  650 mg Oral TID  . ALPRAZolam  1 mg Oral TID  . amLODipine  10 mg Oral QHS  . calcium acetate  2,001 mg Oral BID WC  . calcium acetate  2,001 mg Oral BID WC  . cyclobenzaprine  20 mg Oral QHS  . methocarbamol  500 mg Oral BID  . montelukast  10 mg Oral QHS  . multivitamin  1 tablet Oral QHS  . oxyCODONE  40 mg Oral Q12H  . sevelamer carbonate  2,400 mg Oral BID WC  . sevelamer carbonate  2,400 mg Oral BID WC  . sodium chloride flush  3 mL Intravenous Q12H   . sodium chloride    .  ceFAZolin (ANCEF) IV Stopped (06/09/17 0131)   albuterol, hydrALAZINE, HYDROmorphone (DILAUDID) injection, promethazine  Exam: Not in distress, in pain No jvd Chest clear bilat RRR no mrg w/ AVF compressed Abd soft ntnd no mass or ascites Ext L lower leg/ankle erythema improving a bit NF, ox 3 LUA AVF+bruit  Dialysis Orders:  MWF at [facility] 3.5h   65.5kg   2/2 bath  Hep none  LUA AVF - Mircera 177mcg IV q 2 weeks - Venofer 50mg  IV weekly - BMM meds: Phoslo 3/meals AND Renvela 3/meals. No VDRA.   Assessment/Plan: 1. Sepsis/L leg cellulitis vs other: on IV abx, blood cx's negative. High fever 103 yest.  2. ESRD: cont on MWF HD.  3. Hypertension/volume: BP slightly high, at dry weight.  4. Anemia: Hgb down < 7, will give prbc's with HD today. Will give Aranesp 266mcg today 8/10 5. Metabolic bone disease: Ca/Phos ok. Continue Phoslo/Renvela. 6. Nutrition: Alb 2.8, adding Nepro supps. 7. Chronic back pain: per primary. 8. Depression/anxiety: Home meds per  primary.   Plan - HD again today, transfuse/ ESA   Caitlinn Splinter MD [facility] pager [phone]   06/10/2017, 12:04 PM    Recent Labs Lab 06/08/17 0233 06/09/17 0557 06/10/17 0404  NA 140 140 138  K 3.9 4.4 3.7  CL 94* 96* 96*  CO2 29 28 29   GLUCOSE 154* 102* 83  BUN 35* 46* 24*  CREATININE 6.96* 8.36*  8.47* 5.29*  CALCIUM 9.8 9.7 9.6  PHOS  --  4.7*  --     Recent Labs Lab 06/08/17 0233 06/09/17 0557  AST 13*  --   ALT 11*  --   ALKPHOS 94  --   BILITOT 0.8  --   PROT 6.0*  --   ALBUMIN 2.9* 2.8*    Recent Labs Lab 06/05/17 1020 06/08/17 0233 06/09/17 0557 06/10/17 0404  WBC 10.2 12.9* 13.7* 13.8*  NEUTROABS 7.9* 10.6*  --  11.2*  HGB 7.5* 8.2* 7.6* 6.9*  HCT 24.2* 26.9* 25.2* 22.1*  MCV 98.0 97.1 98.4 96.5  PLT 235 299 295 252   Iron/TIBC/Ferritin/ %Sat    Component Value Date/Time   IRON 14 (L) 02/14/2014 2025   TIBC 149 (L) 02/14/2014 2025  FERRITIN 1,167 (H) 02/14/2014 2025   IRONPCTSAT 9 (L) 02/14/2014 2025

## 2017-06-10 NOTE — Progress Notes (Signed)
Initial Nutrition Assessment  DOCUMENTATION CODES:   Not applicable  INTERVENTION:   -Nepro Shake po TID, each supplement provides 425 kcal and 19 grams protein -Continue renal MVI  NUTRITION DIAGNOSIS:   Predicted suboptimal nutrient intake related to poor appetite, chronic illness as evidenced by meal completion < 25%, percent weight loss.  GOAL:   Patient will meet greater than or equal to 90% of their needs  MONITOR:   PO intake, Supplement acceptance, Labs, Weight trends, Skin, I & O's  REASON FOR ASSESSMENT:   Malnutrition Screening Tool    ASSESSMENT:   [name] is a 46 y.o. female presenting with left ankle pain and swelling. PMH is significant for ESRD on HD, HTN, bronchitis, anxiety/depression, chronic low back pain.  Pt admitted with sepsis and lt ankle pain.   Pt very tearful at time of visit. Interview and exam deferred at this time due to pt's affect.   Case discussed with RN, who reports that pt has been tearful all shift. Pt did not eat hardly anything at breakfast per RN report. Pt to receive blood transfusion today due to low hemoglobin.   Wt hx reviewed. Noted pt has experienced a 7.5% wt loss over the past 5 months. This is not significant, however, concerning when coupled with poor oral intake.   Pt's last HD was 06/09/17.   Medications reviewed and include phoslo, rena-vit, and renelva.  Labs reviewed: Phos: 4.7.   Diet Order:  Diet renal with fluid restriction Fluid restriction: 1200 mL Fluid; Room service appropriate? Yes; Fluid consistency: Thin  Skin:  Reviewed, no issues  Last BM:  06/09/17  Height:   Ht Readings from Last 1 Encounters:  06/09/17 5\' 4"  (1.626 m)    Weight:   Wt Readings from Last 1 Encounters:  06/09/17 143 lb 4.8 oz (65 kg)    Ideal Body Weight:  54.5 kg  BMI:  Body mass index is 24.6 kg/m.  Estimated Nutritional Needs:   Kcal:  4315-4008  Protein:  85-100 grams  Fluid:  1000 ml +  UOP  EDUCATION NEEDS:   Education needs no appropriate at this time  [place] Oborn A. Jimmye Norman, RD, LDN, CDE Pager: [phone] After hours Pager: [phone]

## 2017-06-10 NOTE — Progress Notes (Signed)
Pt reported pain of 10/10 at 1700. Dilaudid was given to patient and also prometharzine was administered when she complained of stomach upset and nausea. Shortly before pt was taken for dialysis, she requested for bed pan, which was given to her. She never complained of chest pain to me. The transporter was waiting for her to be taken for dialysis, I actually reminded her that she needed to go for dialysis because it will make her feel better. Pt did not have any relieve from pain, even though she had pain medication all day.

## 2017-06-10 NOTE — Progress Notes (Signed)
Was about to administer first unit of PRBCs as ordered. When checking patient's vitals, oral temperature was 100.6 oral. Notified MD and received order to give Tylenol and wait for temperature to decrease before giving blood. Will continue to monitor.  Markus Jarvis, RN

## 2017-06-10 NOTE — Progress Notes (Signed)
CRITICAL VALUE ALERT  Critical Value:  HB 6.9  Date & Time Notied:  06/10/17 AT 0618  Provider Notified: [facility]  Orders Received/Actions taken: response not received yet

## 2017-06-10 NOTE — Progress Notes (Signed)
Patient arrived to unit complaining of 7/10 chest pain, describing pain as sharp.  Patient observed to be crying as pain assessment administered.  Pt states that pain started on 5W, when transport arrived to take her to HD treatment.  Pt stated that she informed her bedside RN of chest pain.  Pt states bedside RN noted that patient needed dialysis.  This RN phoned bedside RN to query about pain assessment prior to transport, bedside RN denied any knowledge of chest pain, stating patient did not inform her of chest pain.  This RN treated chest pain with PRN dilaudid, chest pain resolved; however patient now stating 10/10 R leg pain.  This RN provided repositioning and distraction.  Will continue to monitor.

## 2017-06-11 ENCOUNTER — Inpatient Hospital Stay (HOSPITAL_COMMUNITY): Payer: Medicare Other

## 2017-06-11 DIAGNOSIS — I361 Nonrheumatic tricuspid (valve) insufficiency: Secondary | ICD-10-CM

## 2017-06-11 LAB — BASIC METABOLIC PANEL
ANION GAP: 11 (ref 5–15)
BUN: 18 mg/dL (ref 6–20)
CHLORIDE: 94 mmol/L — AB (ref 101–111)
CO2: 33 mmol/L — ABNORMAL HIGH (ref 22–32)
Calcium: 9.3 mg/dL (ref 8.9–10.3)
Creatinine, Ser: 4.07 mg/dL — ABNORMAL HIGH (ref 0.44–1.00)
GFR calc Af Amer: 14 mL/min — ABNORMAL LOW (ref >60)
GFR calc non Af Amer: 12 mL/min — ABNORMAL LOW (ref >60)
Glucose, Bld: 140 mg/dL — ABNORMAL HIGH (ref 65–99)
POTASSIUM: 3.4 mmol/L — AB (ref 3.5–5.1)
SODIUM: 138 mmol/L (ref 135–145)

## 2017-06-11 LAB — TYPE AND SCREEN
ABO/RH(D): A POS
Antibody Screen: NEGATIVE
UNIT DIVISION: 0
UNIT DIVISION: 0

## 2017-06-11 LAB — CBC
HCT: 28 % — ABNORMAL LOW (ref 36.0–46.0)
HEMOGLOBIN: 8.8 g/dL — AB (ref 12.0–15.0)
MCH: 29.1 pg (ref 26.0–34.0)
MCHC: 31.4 g/dL (ref 30.0–36.0)
MCV: 92.7 fL (ref 78.0–100.0)
Platelets: 254 10*3/uL (ref 150–400)
RBC: 3.02 MIL/uL — AB (ref 3.87–5.11)
RDW: 16.3 % — ABNORMAL HIGH (ref 11.5–15.5)
WBC: 14.7 10*3/uL — AB (ref 4.0–10.5)

## 2017-06-11 LAB — BPAM RBC
BLOOD PRODUCT EXPIRATION DATE: 201808292359
BLOOD PRODUCT EXPIRATION DATE: 201808292359
ISSUE DATE / TIME: 201808101935
ISSUE DATE / TIME: 201808101935
UNIT TYPE AND RH: 6200
Unit Type and Rh: 6200

## 2017-06-11 LAB — ECHOCARDIOGRAM COMPLETE
HEIGHTINCHES: 64 in
WEIGHTICAEL: 2289.26 [oz_av]

## 2017-06-11 LAB — TROPONIN I: Troponin I: <0.03 ng/mL (ref <0.03)

## 2017-06-11 MED ORDER — SODIUM CHLORIDE 0.9 % IV BOLUS (SEPSIS): 500.0000 mL | Freq: Once | INTRAVENOUS | Status: DC

## 2017-06-11 MED ORDER — LINEZOLID 600 MG PO TABS
600.0000 mg | ORAL_TABLET | Freq: Two times a day (BID) | ORAL | Status: DC
  Administered 2017-06-11 – 2017-06-14 (×6): 600 mg via ORAL
  Filled 2017-06-11 (×8): qty 1

## 2017-06-11 NOTE — Progress Notes (Signed)
[facility] Progress Note  Subjective: says her foot is worse today, more pain and redness   Vitals:   06/10/17 2311 06/10/17 2328 06/11/17 0517 06/11/17 0635  BP: (!) 154/89 140/72 (!) 163/85 (!) 159/104  Pulse: 86 89 100 99  Resp:  20 18 18   Temp:  100.3 F (37.9 C) (!) 101.5 F (38.6 C) 99.9 F (37.7 C)  TempSrc:  Oral Axillary Oral  SpO2:  98% 99% 98%  Weight:      Height:        Inpatient medications: . acetaminophen  650 mg Oral TID  . ALPRAZolam  1 mg Oral TID  . amLODipine  10 mg Oral QHS  . calcium acetate  2,001 mg Oral BID WC  . cyclobenzaprine  20 mg Oral QHS  . darbepoetin (ARANESP) injection - DIALYSIS  200 mcg Intravenous Q Fri-HD  . feeding supplement (NEPRO CARB STEADY)  237 mL Oral TID BM  . methocarbamol  500 mg Oral BID  . metoprolol tartrate  12.5 mg Oral BID  . montelukast  10 mg Oral QHS  . multivitamin  1 tablet Oral QHS  . oxyCODONE  40 mg Oral Q12H  . sevelamer carbonate  2,400 mg Oral BID WC  . sodium chloride flush  3 mL Intravenous Q12H   .  ceFAZolin (ANCEF) IV Stopped (06/10/17 2252)   albuterol, hydrALAZINE, HYDROmorphone (DILAUDID) injection, promethazine  Exam: Not in distress, in pain No jvd Chest clear bilat RRR no mrg w/ AVF compressed Abd soft ntnd no mass or ascites Ext L lower leg/ankle erythema improving a bit NF, ox 3 LUA AVF+bruit  Dialysis Orders:  MWF at [facility] 3.5h   65.5kg   2/2 bath  Hep none  LUA AVF - Mircera 161mcg IV q 2 weeks - Venofer 50mg  IV weekly - BMM meds: Phoslo 3/meals AND Renvela 3/meals. No VDRA.   Assessment/Plan: 1. Sepsis/L leg cellulitis vs other: on IV abx, blood cx's negative. Erythema was improving but today is worse and outside of the drawn markings. Pain persists and WBC rising.  Have d/w primary team.   2. ESRD: cont on MWF HD.  3. Hypertension/volume: BP slightly high, at dry weight.  4. Anemia: Hgb up 8.8 after 2u prbc's.  SP Aranesp 234mcg 8/10 5. Metabolic bone  disease: Ca/Phos ok. Continue Phoslo/Renvela. 6. Nutrition: Alb 2.8, adding Nepro supps. 7. Chronic back pain: per primary. 8. Depression/anxiety: Home meds per primary.   Plan - HD Monday   Venecia Splinter MD [facility] pager [phone]   06/11/2017, 9:36 AM    Recent Labs Lab 06/09/17 0557 06/10/17 0404 06/11/17 0507  NA 140 138 138  K 4.4 3.7 3.4*  CL 96* 96* 94*  CO2 28 29 33*  GLUCOSE 102* 83 140*  BUN 46* 24* 18  CREATININE 8.36*  8.47* 5.29* 4.07*  CALCIUM 9.7 9.6 9.3  PHOS 4.7*  --   --     Recent Labs Lab 06/08/17 0233 06/09/17 0557  AST 13*  --   ALT 11*  --   ALKPHOS 94  --   BILITOT 0.8  --   PROT 6.0*  --   ALBUMIN 2.9* 2.8*    Recent Labs Lab 06/05/17 1020 06/08/17 0233 06/09/17 0557 06/10/17 0404 06/11/17 0507  WBC 10.2 12.9* 13.7* 13.8* 14.7*  NEUTROABS 7.9* 10.6*  --  11.2*  --   HGB 7.5* 8.2* 7.6* 6.9* 8.8*  HCT 24.2* 26.9* 25.2* 22.1* 28.0*  MCV 98.0 97.1  98.4 96.5 92.7  PLT 235 299 295 252 254   Iron/TIBC/Ferritin/ %Sat    Component Value Date/Time   IRON 14 (L) 02/14/2014 2025   TIBC 149 (L) 02/14/2014 2025   FERRITIN 1,167 (H) 02/14/2014 2025   IRONPCTSAT 9 (L) 02/14/2014 2025

## 2017-06-11 NOTE — Progress Notes (Signed)
  Echocardiogram 2D Echocardiogram has been performed.  [name] T [name] 06/11/2017, 1:46 PM

## 2017-06-11 NOTE — Progress Notes (Signed)
Family Medicine Teaching Service Daily Progress Note Intern Pager: [phone]  Patient name: [name] Medical record number: [id number] Date of birth: 03-Jul-1971 Age: 46 y.o. Gender: female  Primary Care Provider: Greig Right, MD Consultants: ortho Code Status: full  Pt Overview and Major Events to Date:  [name] a 46 y.o.femalepresenting with left ankle pain and swelling. PMH is significant for ESRD on HD, HTN, bronchitis, anxiety/depression, chronic low back pain. MRI showed potential tear to tibialis tendon/tendosynovitis and fluid surrounding joint but no marrow process.  Assessment and Plan: [name] a 46 y.o.femalepresenting with left ankle pain and swelling. PMH is significant for ESRD on HD, HTN, bronchitis, anxiety/depression, chronic low back pain.  Sepsis with left ankle pain: Concern for septic arthritis, given that she has edema and excruciating pain of the left ankle. She is unable to move her ankle on exam. Other differentials include tenosynovitis vs cellulitis. No open wounds. Think infectious cause is most likely with subjective fevers and chills at home, warmth of skin, and mildly elevated WBC of 13.8.  MRI neg for osteomyolitis. DVT less likely with negative duplex US on 8/5. - Admit to telemetry under inpatient status, attending Dr. Ardelia Mems. - d/c ancef per ID -start linezolid per ID -D/c flexiril due to concerns of serotonin syndrome w/ linezolid - Blood cultures (no growth @2day ) - MRI showed potential tear to tibialis tendon and fluid surrounding joint but no marrow process. - Ortho consulted. Does not think surgical intervention is indicated, will follow for change  Appreciate their assistance. - Dilaudid 1mg  q2hrs prn, home dose oxycontinand Tylenol for pain - Hold off on fluids for now, as BP is hypertensive and patient is ESRD - Repeat CBC and renal function panel in the morning  ESRD on HD: Nephrologist  is Dr. Justin Mend. MWF in [place]. Last HD session was Friday and got the full session. Has been on HD since 2013. 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 K 3.9 and patient is not fluid overloaded, so no indication for emergent HD. -last HD 8/10 -Continue home Phoslo 2,001mg  bid with meals, Renvela 2,400mg  bid with meals, and Rena-vit daily -No outpatient lasix per nephro. Appreciate input.  HTN: BP 140/90 in the ED. - Hold home Hydralazine 50mg  tid, Lisinopril 40mg  daily, will restart one 8/10 if hypertensive overnight on norvasc -restart Norvasc 10mg  daily -per nephro, no need to restart lasix - Will add Hydralazine IV prn for SBP > 160  Anemia in CKD: Hgb 6.9 on 8/10. Baseline 9-11. -transfused 1unit during HD 8/10 -Hgb 8.8 s/p transfusion -cbc in AM - Appreciate any recommendations per Nephro.  ?Bronchitis: Unclear if she has had PFTs in the past, but she smokes 1/2 ppd. Per patient, she does not have a diagnosis of COPD. Breathing comfortably on room air in the ED. -lungs sound CTA 8/11 - discuss home symbicort or ordering inpatient dulera with patient -continue Albuterol 2 puffs q6hrs prn, and Singulair 10mg  daily  Anxiety: Takes Xanax tid prn at home. Patient appears anxious on exam and states she is worried about what is going on with her ankle. - Continue home Xanax  Chronic Low Back Pain: Takes Dilaudid 2mg  bid prn and Oxycodone 40mg  bid at home. Follows with a pain clinic in [place]. - will start PCA  -Robaxin 500mg  bid  -Flexeril d/c'd due to linezolid  FEN/GI: Renal diet with fluid restriction. Prophylaxis: Heparin sq  Disposition:Admit to telemetry under inpatient status. Discharge home pending  clinical improvement.  Subjective:  Patient still says pain is completely unmanaged and she feels her ankle looks worse than prior.  She says she is breathing better and has no chest pain after the transfusion yesterday  Objective: Temp:   [99.6 F (37.6 C)-100.6 F (38.1 C)] 100.3 F (37.9 C) (08/10 2328) Pulse Rate:  [84-106] 89 (08/10 2328) Resp:  [12-24] 20 (08/10 2328) BP: (140-175)/(72-99) 140/72 (08/10 2328) SpO2:  [96 %-100 %] 98 % (08/10 2328) Weight:  [143 lb 1.3 oz (64.9 kg)-144 lb 2.9 oz (65.4 kg)] 143 lb 1.3 oz (64.9 kg) (08/10 2308) Physical Exam: General: still in visible pain, winces with any movement of leg, alert and able to converse freely Cardiovascular: RRR, still referred sounds from fistula in L arm, distal pulses intact but left DP is decreased Respiratory: lung sounds CTA with no wheezing and no IWB Abdomen: soft to palpation with no rebound tenderness and bowel sounds in all 4 quadrants Extremities: Left leg still hot to touch at distal leg within marker line, anterior erythema seems to have stayed consistent and maybe declined, posterior erythema slightly advanced proximal to the line (could be fluid settling, could be infection growth), distal pulse palpable by decreased, foot is cool but able to move toes, distal sensation intact but she states it is decreased  Laboratory:  Recent Labs Lab 06/08/17 0233 06/09/17 0557 06/10/17 0404  WBC 12.9* 13.7* 13.8*  HGB 8.2* 7.6* 6.9*  HCT 26.9* 25.2* 22.1*  PLT 299 295 252    Recent Labs Lab 06/08/17 0233 06/09/17 0557 06/10/17 0404  NA 140 140 138  K 3.9 4.4 3.7  CL 94* 96* 96*  CO2 29 28 29   BUN 35* 46* 24*  CREATININE 6.96* 8.36*  8.47* 5.29*  CALCIUM 9.8 9.7 9.6  PROT 6.0*  --   --   BILITOT 0.8  --   --   ALKPHOS 94  --   --   ALT 11*  --   --   AST 13*  --   --   GLUCOSE 154* 102* 83      Imaging/Diagnostic Tests: Mr Ankle Left Wo Contrast  Result Date: 06/08/2017 CLINICAL DATA:  Ankle pain of uncertain etiology. EXAM: MRI OF THE LEFT ANKLE WITHOUT CONTRAST TECHNIQUE: Multiplanar, multisequence MR imaging of the ankle was performed. No intravenous contrast was administered. COMPARISON:  None. FINDINGS: Exam is limited by  patient motion artifacts. TENDONS Peroneal: Intact peroneus longus and peroneus brevis tendons. Posteromedial: A moderate to marked amount of fluid outlines the tibialis posterior tendon with intrasubstance T2 signal noted within the tendon and muscle leading up to the tendon suspicious for tenosynovitis, muscle strain and longitudinal split tear/tendinosis of the tendon (series 6, image 12, series 10 image 12, series 9 image 1 through 12 for example). The flexor digitorum longus and flexor hallucis longus tendons are intact. Anterior: Intact tibialis anterior, extensor hallucis longus and extensor digitorum longus tendons. Achilles: Intact. Plantar Fascia: Intact. LIGAMENTS Lateral: Grossly intact Medial: Grossly intact CARTILAGE Ankle Joint: No joint effusion or chondral defect. Subtalar Joints/Sinus Tarsi: No joint effusion or chondral defect. Bones: No marrow signal abnormality. No fracture or dislocation. Other: Diffuse soft tissue edema of the included ankle and foot. IMPRESSION: 1. Moderate-to-marked amount of fluid outlining the tibialis posterior tendon with intrasubstance elevated T2 signal suspicious for intrasubstance tear and tenosynovitis. The muscle is also edematous suspicious for a strain or potentially myositis. 2. Diffuse subcutaneous edema about the included ankle and foot consistent with  third spacing of fluid or cellulitis. 3. No acute marrow signal abnormalities noted. Electronically Signed   By: Ashley Royalty M.D.   On: 06/08/2017 18:07     Sherene Sires, DO 06/11/2017, 1:14 AM PGY-1, [place] Intern pager: [phone], text pages welcome

## 2017-06-11 NOTE — Progress Notes (Signed)
RN paged and spoke to Dr. Rich Reining regarding patient's concern about receiving CT.  Dr. Cleopatra Cedar states patient has no order at this time for CT and that orthopedic PA will be discussing need for test with orthopedist. RN reported patient's increased redness in left lower leg, elevated BP of 173/91 and temp of 99.9 to Dr. Cleopatra Cedar, no new orders received. Dr. Cleopatra Cedar states patient has been prescribed Linezolid as broad spectrum antibiotic and voiced we will continue to monitor patient, no new orders received. RN will continue to monitor patient and report issues to MD as needed.  P.J. Linus Mako, RN

## 2017-06-11 NOTE — Progress Notes (Signed)
Patient ID: [name], female   DOB: 16-Jun-1971, 46 y.o.   MRN: [id number]     Subjective:  Patient reports pain as moderate to severe.  States that the left lower leg is getting worse  Objective:   VITALS:   Vitals:   06/10/17 2311 06/10/17 2328 06/11/17 0517 06/11/17 0635  BP: (!) 154/89 140/72 (!) 163/85 (!) 159/104  Pulse: 86 89 100 99  Resp:  20 18 18   Temp:  100.3 F (37.9 C) (!) 101.5 F (38.6 C) 99.9 F (37.7 C)  TempSrc:  Oral Axillary Oral  SpO2:  98% 99% 98%  Weight:      Height:        ABD soft Sensation intact distally Patient reports pain is worse Redness seem to be getting worse  Very tender to touch EHL FHL firing Redness more medial and proximal to the foot and ankle  Lab Results  Component Value Date   WBC 14.7 (H) 06/11/2017   HGB 8.8 (L) 06/11/2017   HCT 28.0 (L) 06/11/2017   MCV 92.7 06/11/2017   PLT 254 06/11/2017   BMET    Component Value Date/Time   NA 138 06/11/2017 0507   K 3.4 (L) 06/11/2017 0507   CL 94 (L) 06/11/2017 0507   CO2 33 (H) 06/11/2017 0507   GLUCOSE 140 (H) 06/11/2017 0507   BUN 18 06/11/2017 0507   CREATININE 4.07 (H) 06/11/2017 0507   CALCIUM 9.3 06/11/2017 0507   CALCIUM 9.3 01/03/2012 1109   GFRNONAA 12 (L) 06/11/2017 0507   GFRAA 14 (L) 06/11/2017 0507     Assessment/Plan:     Active Problems:   Cellulitis   Heart murmur   Advance diet Continue plan per Medicine Patient has had MRI already  Will check with Dr Mardelle Matte to see if other imaging is needed    [name] 06/11/2017, 10:55 AM  Patient seen and agree with above.  We will order a CAT scan to evaluate for possible fluid collection, although the MRI did not look bad, but clinically she seems to be worsening. I will also contact Dr. Sharol Given for second opinion regarding complex infected lower extremity in a dialysis patient. I am also going to keep her nothing by mouth for now, if there is a fluid collection she may need  surgical debridement.  Sarah Bond, MD Cell [phone]

## 2017-06-12 ENCOUNTER — Encounter (HOSPITAL_COMMUNITY)
Admission: EM | Disposition: A | Discharge status: Home / Self Care | Payer: Self-pay | Source: Home / Self Care | Attending: Family Medicine

## 2017-06-12 ENCOUNTER — Inpatient Hospital Stay (HOSPITAL_COMMUNITY): Payer: Medicare Other | Admitting: Certified Registered"

## 2017-06-12 ENCOUNTER — Inpatient Hospital Stay (HOSPITAL_COMMUNITY): Payer: Medicare Other

## 2017-06-12 ENCOUNTER — Encounter (HOSPITAL_COMMUNITY): Payer: Self-pay | Admitting: Certified Registered Nurse Anesthetist

## 2017-06-12 DIAGNOSIS — M659 Synovitis and tenosynovitis, unspecified: Secondary | ICD-10-CM

## 2017-06-12 HISTORY — PX: I&D EXTREMITY: SHX5045

## 2017-06-12 LAB — CBC
HCT: 29 % — ABNORMAL LOW (ref 36.0–46.0)
Hemoglobin: 9.1 g/dL — ABNORMAL LOW (ref 12.0–15.0)
MCH: 29.7 pg (ref 26.0–34.0)
MCHC: 31.4 g/dL (ref 30.0–36.0)
MCV: 94.8 fL (ref 78.0–100.0)
PLATELETS: 291 10*3/uL (ref 150–400)
RBC: 3.06 MIL/uL — ABNORMAL LOW (ref 3.87–5.11)
RDW: 16.6 % — AB (ref 11.5–15.5)
WBC: 13.8 10*3/uL — AB (ref 4.0–10.5)

## 2017-06-12 LAB — BASIC METABOLIC PANEL
ANION GAP: 12 (ref 5–15)
BUN: 35 mg/dL — ABNORMAL HIGH (ref 6–20)
CALCIUM: 10.3 mg/dL (ref 8.9–10.3)
CO2: 29 mmol/L (ref 22–32)
CREATININE: 6.34 mg/dL — AB (ref 0.44–1.00)
Chloride: 99 mmol/L — ABNORMAL LOW (ref 101–111)
GFR, EST AFRICAN AMERICAN: 8 mL/min — AB (ref >60)
GFR, EST NON AFRICAN AMERICAN: 7 mL/min — AB (ref >60)
Glucose, Bld: 99 mg/dL (ref 65–99)
Potassium: 4.2 mmol/L (ref 3.5–5.1)
SODIUM: 140 mmol/L (ref 135–145)

## 2017-06-12 SURGERY — IRRIGATION AND DEBRIDEMENT EXTREMITY
Anesthesia: General | Site: Leg Lower | Laterality: Left

## 2017-06-12 MED ORDER — METHOCARBAMOL 500 MG PO TABS
500.0000 mg | ORAL_TABLET | Freq: Four times a day (QID) | ORAL | Status: DC | PRN
  Administered 2017-06-12: 500 mg via ORAL

## 2017-06-12 MED ORDER — PROPOFOL 10 MG/ML IV BOLUS
INTRAVENOUS | Status: AC
  Filled 2017-06-12: qty 20

## 2017-06-12 MED ORDER — DOCUSATE SODIUM 100 MG PO CAPS
100.0000 mg | ORAL_CAPSULE | Freq: Two times a day (BID) | ORAL | Status: DC
  Administered 2017-06-13 – 2017-06-14 (×3): 100 mg via ORAL
  Filled 2017-06-12 (×4): qty 1

## 2017-06-12 MED ORDER — ALTEPLASE 2 MG IJ SOLR
2.0000 mg | Freq: Once | INTRAMUSCULAR | Status: DC | PRN
  Filled 2017-06-12: qty 2

## 2017-06-12 MED ORDER — PENTAFLUOROPROP-TETRAFLUOROETH EX AERO: 1.0000 "application " | INHALATION_SPRAY | CUTANEOUS | Status: DC | PRN

## 2017-06-12 MED ORDER — LIDOCAINE 2% (20 MG/ML) 5 ML SYRINGE
INTRAMUSCULAR | Status: AC
  Filled 2017-06-12: qty 5

## 2017-06-12 MED ORDER — FENTANYL CITRATE (PF) 250 MCG/5ML IJ SOLN
INTRAMUSCULAR | Status: AC
  Filled 2017-06-12: qty 5

## 2017-06-12 MED ORDER — FENTANYL CITRATE (PF) 100 MCG/2ML IJ SOLN
INTRAMUSCULAR | Status: AC
  Administered 2017-06-12: 50 ug via INTRAVENOUS
  Filled 2017-06-12: qty 2

## 2017-06-12 MED ORDER — AMLODIPINE BESYLATE 10 MG PO TABS: 10.0000 mg | ORAL_TABLET | Freq: Every day | ORAL | Status: DC

## 2017-06-12 MED ORDER — ONDANSETRON HCL 4 MG PO TABS: 4.0000 mg | ORAL_TABLET | Freq: Four times a day (QID) | ORAL | Status: DC | PRN

## 2017-06-12 MED ORDER — BISACODYL 10 MG RE SUPP: 10.0000 mg | Freq: Every day | RECTAL | Status: DC | PRN

## 2017-06-12 MED ORDER — METOCLOPRAMIDE HCL 5 MG/ML IJ SOLN: 5.0000 mg | Freq: Three times a day (TID) | INTRAMUSCULAR | Status: DC | PRN

## 2017-06-12 MED ORDER — PROPOFOL 10 MG/ML IV BOLUS
INTRAVENOUS | Status: DC | PRN
  Administered 2017-06-12: 120 mg via INTRAVENOUS
  Administered 2017-06-12: 20 mg via INTRAVENOUS

## 2017-06-12 MED ORDER — HYDROMORPHONE HCL 1 MG/ML IJ SOLN
INTRAMUSCULAR | Status: AC
  Administered 2017-06-12: 0.5 mg via INTRAVENOUS
  Filled 2017-06-12: qty 1

## 2017-06-12 MED ORDER — HYDROMORPHONE HCL 1 MG/ML IJ SOLN
2.0000 mg | INTRAMUSCULAR | Status: DC | PRN
  Administered 2017-06-12 (×3): 2 mg via INTRAVENOUS
  Administered 2017-06-12: 1 mg via INTRAVENOUS
  Administered 2017-06-12 – 2017-06-13 (×4): 2 mg via INTRAVENOUS
  Filled 2017-06-12 (×8): qty 2

## 2017-06-12 MED ORDER — SODIUM CHLORIDE 0.9 % IV SOLN
100.0000 mL | INTRAVENOUS | Status: DC | PRN
  Administered 2017-06-12: 17:00:00 via INTRAVENOUS

## 2017-06-12 MED ORDER — HEPARIN SODIUM (PORCINE) 1000 UNIT/ML DIALYSIS
1000.0000 [IU] | INTRAMUSCULAR | Status: DC | PRN
  Filled 2017-06-12: qty 1

## 2017-06-12 MED ORDER — ONDANSETRON HCL 4 MG/2ML IJ SOLN: 4.0000 mg | Freq: Once | INTRAMUSCULAR | Status: DC | PRN

## 2017-06-12 MED ORDER — METOPROLOL TARTRATE 25 MG PO TABS
25.0000 mg | ORAL_TABLET | Freq: Two times a day (BID) | ORAL | Status: DC
  Administered 2017-06-12 – 2017-06-14 (×5): 25 mg via ORAL
  Filled 2017-06-12 (×5): qty 1

## 2017-06-12 MED ORDER — DIPHENHYDRAMINE HCL 12.5 MG/5ML PO ELIX: 12.5000 mg | ORAL_SOLUTION | ORAL | Status: DC | PRN

## 2017-06-12 MED ORDER — METHOCARBAMOL 500 MG PO TABS
ORAL_TABLET | ORAL | Status: AC
  Filled 2017-06-12: qty 1

## 2017-06-12 MED ORDER — SODIUM CHLORIDE 0.9 % IV SOLN
100.0000 mL | INTRAVENOUS | Status: DC | PRN
  Administered 2017-06-14: 10:00:00 via INTRAVENOUS

## 2017-06-12 MED ORDER — MIDAZOLAM HCL 2 MG/2ML IJ SOLN
INTRAMUSCULAR | Status: AC
  Filled 2017-06-12: qty 2

## 2017-06-12 MED ORDER — OXYCODONE HCL 5 MG PO TABS: 5.0000 mg | ORAL_TABLET | ORAL | Status: DC | PRN

## 2017-06-12 MED ORDER — LIDOCAINE-PRILOCAINE 2.5-2.5 % EX CREA: 1.0000 "application " | TOPICAL_CREAM | CUTANEOUS | Status: DC | PRN

## 2017-06-12 MED ORDER — POLYETHYLENE GLYCOL 3350 17 G PO PACK
17.0000 g | PACK | Freq: Every day | ORAL | Status: DC | PRN
Start: 2017-06-12 — End: 2017-06-14

## 2017-06-12 MED ORDER — FENTANYL CITRATE (PF) 100 MCG/2ML IJ SOLN
INTRAMUSCULAR | Status: DC | PRN
  Administered 2017-06-12: 25 ug via INTRAVENOUS
  Administered 2017-06-12 (×6): 50 ug via INTRAVENOUS
  Administered 2017-06-12: 25 ug via INTRAVENOUS
  Administered 2017-06-12: 50 ug via INTRAVENOUS

## 2017-06-12 MED ORDER — METHOCARBAMOL 1000 MG/10ML IJ SOLN
500.0000 mg | Freq: Four times a day (QID) | INTRAVENOUS | Status: DC | PRN
  Filled 2017-06-12: qty 5

## 2017-06-12 MED ORDER — LIDOCAINE HCL (CARDIAC) 20 MG/ML IV SOLN
INTRAVENOUS | Status: DC | PRN
  Administered 2017-06-12: 60 mg via INTRAVENOUS

## 2017-06-12 MED ORDER — HYDROMORPHONE HCL 1 MG/ML IJ SOLN
0.5000 mg | INTRAMUSCULAR | Status: AC | PRN
  Administered 2017-06-12 (×4): 0.5 mg via INTRAVENOUS

## 2017-06-12 MED ORDER — BUPROPION HCL ER (SR) 150 MG PO TB12
150.0000 mg | ORAL_TABLET | Freq: Every day | ORAL | Status: DC
  Filled 2017-06-12 (×3): qty 1

## 2017-06-12 MED ORDER — ONDANSETRON HCL 4 MG/2ML IJ SOLN: 4.0000 mg | Freq: Four times a day (QID) | INTRAMUSCULAR | Status: DC | PRN

## 2017-06-12 MED ORDER — HYDROMORPHONE HCL 2 MG PO TABS: 2.0000 mg | ORAL_TABLET | Freq: Two times a day (BID) | ORAL | Status: DC | PRN

## 2017-06-12 MED ORDER — ROCURONIUM BROMIDE 10 MG/ML (PF) SYRINGE
PREFILLED_SYRINGE | INTRAVENOUS | Status: AC
  Filled 2017-06-12: qty 5

## 2017-06-12 MED ORDER — SUCCINYLCHOLINE CHLORIDE 20 MG/ML IJ SOLN
INTRAMUSCULAR | Status: DC | PRN
  Administered 2017-06-12: 40 mg via INTRAVENOUS

## 2017-06-12 MED ORDER — SENNA 8.6 MG PO TABS
1.0000 | ORAL_TABLET | Freq: Two times a day (BID) | ORAL | Status: DC
  Administered 2017-06-13: 8.6 mg via ORAL
  Filled 2017-06-12 (×3): qty 1

## 2017-06-12 MED ORDER — ONDANSETRON HCL 4 MG/2ML IJ SOLN
INTRAMUSCULAR | Status: DC | PRN
  Administered 2017-06-12: 4 mg via INTRAVENOUS

## 2017-06-12 MED ORDER — MIDAZOLAM HCL 5 MG/5ML IJ SOLN
INTRAMUSCULAR | Status: DC | PRN
  Administered 2017-06-12: 2 mg via INTRAVENOUS

## 2017-06-12 MED ORDER — ONDANSETRON HCL 4 MG/2ML IJ SOLN
INTRAMUSCULAR | Status: AC
  Filled 2017-06-12: qty 2

## 2017-06-12 MED ORDER — HYDROMORPHONE HCL 2 MG PO TABS: 2.0000 mg | ORAL_TABLET | ORAL | Status: DC

## 2017-06-12 MED ORDER — FENTANYL CITRATE (PF) 100 MCG/2ML IJ SOLN
25.0000 ug | INTRAMUSCULAR | Status: DC | PRN
  Administered 2017-06-12 (×2): 50 ug via INTRAVENOUS

## 2017-06-12 MED ORDER — 0.9 % SODIUM CHLORIDE (POUR BTL) OPTIME
TOPICAL | Status: DC | PRN
  Administered 2017-06-12: 1000 mL

## 2017-06-12 MED ORDER — MAGNESIUM CITRATE PO SOLN: 1.0000 | Freq: Once | ORAL | Status: DC | PRN

## 2017-06-12 MED ORDER — METOCLOPRAMIDE HCL 10 MG PO TABS: 5.0000 mg | ORAL_TABLET | Freq: Three times a day (TID) | ORAL | Status: DC | PRN

## 2017-06-12 MED ORDER — SODIUM CHLORIDE 0.9 % IR SOLN
Status: DC | PRN
  Administered 2017-06-12: 3000 mL

## 2017-06-12 MED ORDER — ACETAMINOPHEN 325 MG PO TABS
650.0000 mg | ORAL_TABLET | Freq: Three times a day (TID) | ORAL | Status: DC | PRN
  Administered 2017-06-12: 650 mg via ORAL
  Filled 2017-06-12: qty 2

## 2017-06-12 MED ORDER — LIDOCAINE HCL (PF) 1 % IJ SOLN
5.0000 mL | INTRAMUSCULAR | Status: DC | PRN
  Filled 2017-06-12: qty 5

## 2017-06-12 MED ORDER — DULOXETINE HCL 60 MG PO CPEP
60.0000 mg | ORAL_CAPSULE | Freq: Every day | ORAL | Status: DC
  Administered 2017-06-12 – 2017-06-14 (×3): 60 mg via ORAL
  Filled 2017-06-12 (×3): qty 1

## 2017-06-12 SURGICAL SUPPLY — 46 items
BANDAGE ACE 4X5 VEL STRL LF (GAUZE/BANDAGES/DRESSINGS) ×3 IMPLANT
BANDAGE ACE 6X5 VEL STRL LF (GAUZE/BANDAGES/DRESSINGS) ×3 IMPLANT
BANDAGE ELASTIC 3 VELCRO ST LF (GAUZE/BANDAGES/DRESSINGS) ×3 IMPLANT
BNDG COHESIVE 4X5 TAN STRL (GAUZE/BANDAGES/DRESSINGS) ×3 IMPLANT
BNDG GAUZE ELAST 4 BULKY (GAUZE/BANDAGES/DRESSINGS) ×3 IMPLANT
BOOTCOVER CLEANROOM LRG (PROTECTIVE WEAR) ×6 IMPLANT
COVER SURGICAL LIGHT HANDLE (MISCELLANEOUS) ×3 IMPLANT
CUFF TOURNIQUET SINGLE 34IN LL (TOURNIQUET CUFF) IMPLANT
DRESSING VERAFLO CLEANSE CC (GAUZE/BANDAGES/DRESSINGS) ×1 IMPLANT
DRSG VERAFLO CLEANSE CC (GAUZE/BANDAGES/DRESSINGS) ×3
DURAPREP 26ML APPLICATOR (WOUND CARE) ×3 IMPLANT
ELECT REM PT RETURN 9FT ADLT (ELECTROSURGICAL)
ELECTRODE REM PT RTRN 9FT ADLT (ELECTROSURGICAL) IMPLANT
EVACUATOR 1/8 PVC DRAIN (DRAIN) IMPLANT
GAUZE SPONGE 4X4 12PLY STRL (GAUZE/BANDAGES/DRESSINGS) ×3 IMPLANT
GAUZE SPONGE 4X4 16PLY XRAY LF (GAUZE/BANDAGES/DRESSINGS) ×3 IMPLANT
GAUZE XEROFORM 1X8 LF (GAUZE/BANDAGES/DRESSINGS) ×3 IMPLANT
GLOVE BIOGEL PI ORTHO PRO SZ8 (GLOVE) ×2
GLOVE ORTHO TXT STRL SZ7.5 (GLOVE) ×3 IMPLANT
GLOVE PI ORTHO PRO STRL SZ8 (GLOVE) ×1 IMPLANT
GLOVE SURG ORTHO 8.0 STRL STRW (GLOVE) ×6 IMPLANT
GOWN STRL REUS W/ TWL LRG LVL3 (GOWN DISPOSABLE) IMPLANT
GOWN STRL REUS W/TWL LRG LVL3 (GOWN DISPOSABLE)
HANDPIECE INTERPULSE COAX TIP (DISPOSABLE)
KIT BASIN OR (CUSTOM PROCEDURE TRAY) ×3 IMPLANT
KIT DRSG PREVENA PLUS 7DAY 125 (MISCELLANEOUS) ×3 IMPLANT
KIT ROOM TURNOVER OR (KITS) ×3 IMPLANT
MANIFOLD NEPTUNE II (INSTRUMENTS) ×3 IMPLANT
NS IRRIG 1000ML POUR BTL (IV SOLUTION) ×3 IMPLANT
PACK ORTHO EXTREMITY (CUSTOM PROCEDURE TRAY) ×3 IMPLANT
PAD ARMBOARD 7.5X6 YLW CONV (MISCELLANEOUS) ×6 IMPLANT
PADDING CAST SYNTHETIC 4 (CAST SUPPLIES) ×2
PADDING CAST SYNTHETIC 4X4 STR (CAST SUPPLIES) ×1 IMPLANT
SET HNDPC FAN SPRY TIP SCT (DISPOSABLE) IMPLANT
SPONGE LAP 18X18 X RAY DECT (DISPOSABLE) ×3 IMPLANT
STOCKINETTE IMPERVIOUS 9X36 MD (GAUZE/BANDAGES/DRESSINGS) ×3 IMPLANT
SUT ETHILON 3 0 PS 1 (SUTURE) IMPLANT
SWAB CULTURE ESWAB REG 1ML (MISCELLANEOUS) IMPLANT
TOWEL OR 17X24 6PK STRL BLUE (TOWEL DISPOSABLE) ×3 IMPLANT
TOWEL OR 17X26 10 PK STRL BLUE (TOWEL DISPOSABLE) ×3 IMPLANT
TUBE CONNECTING 12'X1/4 (SUCTIONS) ×1
TUBE CONNECTING 12X1/4 (SUCTIONS) ×2 IMPLANT
UNDERPAD 30X30 (UNDERPADS AND DIAPERS) ×3 IMPLANT
WATER STERILE IRR 1000ML POUR (IV SOLUTION) ×3 IMPLANT
WND VAC CANISTER 500ML (MISCELLANEOUS) ×3 IMPLANT
YANKAUER SUCT BULB TIP NO VENT (SUCTIONS) ×3 IMPLANT

## 2017-06-12 NOTE — Op Note (Signed)
06/08/2017 - 06/12/2017  6:17 PM  PATIENT:  [name]    PRE-OPERATIVE DIAGNOSIS:  Left leg abscess of the posterior tibial tendon sheath   POST-OPERATIVE DIAGNOSIS:  Same  PROCEDURE:  Left leg incision, irrigation, debridement, with evacuation of large abscess of the posterior tibial tendon sheath, and application of small wound VAC, measuring 12 cm x 3 cm, instillational VAC/Provina  SURGEON:  Johnny Bridge, MD  PHYSICIAN ASSISTANT: Joya Gaskins, OPA-C, present and scrubbed throughout the case, critical for completion in a timely fashion, and for retraction, instrumentation, and closure.  ANESTHESIA:   General  PREOPERATIVE INDICATIONS:  [name] is a  46 y.o. female who is dialysis dependent and developed a left lower extremity cellulitis that ultimately progressed to an abscess diagnosed by CAT scan. She elected for surgical management.  The risks benefits and alternatives were discussed with the patient preoperatively including but not limited to the risks of infection, bleeding, nerve injury, cardiopulmonary complications, the need for revision surgery, among others, and the patient was willing to proceed.  We also discussed the risks for persistent infection, the need for future revision irrigation and debridement, and future potential for delayed primary closure.  ESTIMATED BLOOD LOSS: 50 mL  OPERATIVE IMPLANTS: Small wound VAC,Provena  OPERATIVE FINDINGS: She had at least 50 mL of pus that was contained within the posterior tibial tendon sheath and the compartment of the posterior tibial tendon. This was evacuated from the midcalf level down to the level of the posterior malleolus. The tendon itself was intact, although did have a fair amount of tendinosis. The bone did not appear involved. I did not explore the neurovascular bundle. The infection appeared contained within the tendon sheath of the posterior tibial tendon.  OPERATIVE PROCEDURE: The  patient is brought to the operating room and placed in the supine position. Gen. anesthesia was administered. IV antibiotics were given according to her routine. The left lower extremity was prepped and draped in usual sterile fashion. Time out performed. The leg was elevated and a tourniquet inflated. Total tourniquet time was approximately 25 minutes.  Incision was made over the posterior tibial tendon sheath, and dissection carried down through the subcutaneous tissue to the level of the posterior tibia, and the fascia was incised, and substantial purulence was expressed. This was collected within a specimen container, and sent to the laboratory for Gram stain, culture, sensitivity.  Complete evacuation of the abscess was achieved. I explored proximally and it did not appear to track into any of their compartments, nor did it tracked distally. After complete irrigation was carried out, I explored the tendon, which was found to be intact. I irrigated a total of 9 L of fluid through the wound. This was done with cystoscopy tubing.  The wound itself appeared clean at this point and I applied the wound VAC, and had appropriate seal.  The patient was awakened, the tourniquet released, and she returned to the PACU in stable and satisfactory condition. There were no complications and she tolerated the procedure well.

## 2017-06-12 NOTE — Progress Notes (Signed)
Patient still miserable with pain, worsening cellulitis, fluid collection in the posterior tibialis tendon sheath that is fairly large, worrisome for pyomyositis as seen on repeat CT. Recommend urgent surgical incision, irrigation, and debridement. Discussed the potential need for multiple operations. Also likely plan for irrigation all wound VAC.   The risks, benefits, and alternatives have been discussed at length, and the patient is willing to proceed.    Johnny Bridge, MD

## 2017-06-12 NOTE — Progress Notes (Signed)
Patient arrived back to 5W19 from OR. Patient BP 188/98 and HR 100's. Patient continuing to complain of 10/10 leg pain post-surgery after receiving multiple doses of dilaudid and fentanyl in the PACU per Diane,RN in the PACU. Patient made aware that she cannot have any pain medication until 2120 due to order for q2 hour pain medication PRN. Will continue to monitor and treat per MD orders.

## 2017-06-12 NOTE — Anesthesia Procedure Notes (Signed)
Procedure Name: Intubation Date/Time: 06/12/2017 5:29 PM Performed by: Clearnce Sorrel Pre-anesthesia Checklist: Patient identified, Emergency Drugs available, Suction available, Patient being monitored and Timeout performed Patient Re-evaluated:Patient Re-evaluated prior to induction Preoxygenation: Pre-oxygenation with 100% oxygen Induction Type: IV induction Ventilation: Mask ventilation without difficulty Laryngoscope Size: Mac and 4 Grade View: Grade I Tube type: Oral Tube size: 7.0 mm Number of attempts: 1 Airway Equipment and Method: Stylet Placement Confirmation: ETT inserted through vocal cords under direct vision,  positive ETCO2 and breath sounds checked- equal and bilateral Secured at: 22 cm Tube secured with: Tape Dental Injury: Teeth and Oropharynx as per pre-operative assessment

## 2017-06-12 NOTE — Anesthesia Preprocedure Evaluation (Addendum)
Anesthesia Evaluation  Patient identified by MRN, date of birth, ID band Patient awake    Reviewed: Allergy & Precautions, NPO status , Patient's Chart, lab work & pertinent test results  Airway Mallampati: II  TM Distance: >3 FB Neck ROM: Full    Dental  (+) Teeth Intact, Dental Advisory Given   Pulmonary asthma , sleep apnea , Current Smoker,     + wheezing (Right)      Cardiovascular hypertension, Pt. on medications (-) angina(-) CAD and (-) Past MI Normal cardiovascular exam Rhythm:Regular Rate:Normal  Echo 06/11/17: Study Conclusions  - Left ventricle: The cavity size was normal. Wall thickness was normal. Systolic function was normal. The estimated ejection fraction was in the range of 55% to 60%. Wall motion was normal; there were no regional wall motion abnormalities. Features are consistent with a pseudonormal left ventricular filling pattern, with concomitant abnormal relaxation and increased filling   pressure (grade 2 diastolic dysfunction). - Left atrium: The atrium was severely dilated. - Right ventricle: The cavity size was mildly dilated. - Pulmonary arteries: Systolic pressure was mildly increased. PA peak pressure: 36 mm Hg (S). - Pericardium, extracardiac: A trivial pericardial effusion was identified.  Impressions:  - Normal LV systolic function; moderate diastolic dysfunction; severe LAE; mild RVE; mild TR with mildly elevated pulmonary pressure.   Neuro/Psych  Headaches, PSYCHIATRIC DISORDERS Anxiety Depression    GI/Hepatic Neg liver ROS, GERD  ,  Endo/Other  negative endocrine ROS  Renal/GU Dialysis and ESRFRenal diseaseDialysis MWF K+ 4.2     Musculoskeletal  (+) Arthritis ,   Abdominal   Peds  Hematology  (+) Blood dyscrasia, anemia ,   Anesthesia Other Findings   Reproductive/Obstetrics                            Anesthesia Physical Anesthesia Plan  ASA:  III  Anesthesia Plan: General   Post-op Pain Management:    Induction: Intravenous  PONV Risk Score and Plan: 2 and Ondansetron and Dexamethasone  Airway Management Planned: Oral ETT  Additional Equipment:   Intra-op Plan:   Post-operative Plan: Extubation in OR  Informed Consent: I have reviewed the patients History and Physical, chart, labs and discussed the procedure including the risks, benefits and alternatives for the proposed anesthesia with the patient or authorized representative who has indicated his/her understanding and acceptance.   Dental advisory given  Plan Discussed with: CRNA  Anesthesia Plan Comments: (Risks/benefits of general anesthesia discussed with patient including risk of damage to teeth, lips, gum, and tongue, nausea/vomiting, allergic reactions to medications, and the possibility of heart attack, stroke and death.  All patient questions answered.  Patient wishes to proceed.)        Anesthesia Quick Evaluation

## 2017-06-12 NOTE — Anesthesia Postprocedure Evaluation (Signed)
Anesthesia Post Note  Patient: [name]  Procedure(s) Performed: Procedure(s) (LRB): IRRIGATION AND DEBRIDEMENT FOOT (Left)     Patient location during evaluation: PACU Anesthesia Type: General Level of consciousness: awake and alert Pain management: pain level controlled (Chronic pain, has been 10/10 during entire admission per patient) Vital Signs Assessment: post-procedure vital signs reviewed and stable Respiratory status: spontaneous breathing, nonlabored ventilation, respiratory function stable and patient connected to nasal cannula oxygen Cardiovascular status: blood pressure returned to baseline and stable Postop Assessment: no signs of nausea or vomiting Anesthetic complications: no    Last Vitals:  Vitals:   06/12/17 1900 06/12/17 1915  BP: (!) 191/100 (!) 188/98  Pulse:    Resp:    Temp:  36.8 C  SpO2:      Last Pain:  Vitals:   06/12/17 1900  TempSrc:   PainSc: 10-Worst pain ever                 Catalina Gravel

## 2017-06-12 NOTE — Progress Notes (Signed)
          Subjective:  Patient reports pain as severe.  She feels like she is getting worse, every time she puts her foot down it throbs, she can't walk on it, and she is worried she is going to lose her leg.  Objective:   VITALS:   Vitals:   06/10/17 2328 06/11/17 0517 06/11/17 0635 06/11/17 2009  BP: 140/72 (!) 163/85 (!) 159/104 (!) 173/91  Pulse: 89 100 99 94  Resp: 20 18 18    Temp: 100.3 F (37.9 C) (!) 101.5 F (38.6 C) 99.9 F (37.7 C) 99.9 F (37.7 C)  TempSrc: Oral Axillary Oral Oral  SpO2: 98% 99% 98% 100%  Weight:      Height:        Sensation intact distally Dorsiflexion/Plantar flexion intact It is difficult for me to feel pulses given her swelling, but her foot feels warm.    Lab Results  Component Value Date   WBC 14.7 (H) 06/11/2017   HGB 8.8 (L) 06/11/2017   HCT 28.0 (L) 06/11/2017   MCV 92.7 06/11/2017   PLT 254 06/11/2017   BMET    Component Value Date/Time   NA 138 06/11/2017 0507   K 3.4 (L) 06/11/2017 0507   CL 94 (L) 06/11/2017 0507   CO2 33 (H) 06/11/2017 0507   GLUCOSE 140 (H) 06/11/2017 0507   BUN 18 06/11/2017 0507   CREATININE 4.07 (H) 06/11/2017 0507   CALCIUM 9.3 06/11/2017 0507   CALCIUM 9.3 01/03/2012 1109   GFRNONAA 12 (L) 06/11/2017 0507   GFRAA 14 (L) 06/11/2017 0507     Assessment/Plan:     Active Problems:   Cellulitis   Heart murmur   Her leg has the appearance of a severe cellulitis, although a necrotizing cellulitis is also a possibility. I'm going to order a CAT scan, and also get Dr. Jess Barters opinion, hopefully he can see her sometime Sunday, I will keep her nothing by mouth for now, she may need surgical debridement, if there is a fluid collection.  Courtny Bennison P 06/12/2017, 1:42 AM   Marchia Bond, MD Cell [phone]

## 2017-06-12 NOTE — Progress Notes (Signed)
Received report from Jobos in the PACU.

## 2017-06-12 NOTE — Progress Notes (Signed)
Family Medicine Teaching Service Daily Progress Note Intern Pager: [phone]  Patient name: [name] Medical record number: [id number] Date of birth: September 17, 1971 Age: 46 y.o. Gender: female  Primary Care Provider: Greig Right, MD Consultants: ortho Code Status: full  Pt Overview and Major Events to Date:  [name] a 46 y.o.femalepresenting with left ankle pain and swelling. PMH is significant for ESRD on HD, HTN, bronchitis, anxiety/depression, chronic low back pain. MRI showed potential tear to tibialis tendon/tendosynovitis and fluid surrounding joint but no marrow process.  Assessment and Plan: [name] a 46 y.o.femalepresenting with left ankle pain and swelling. PMH is significant for ESRD on HD, HTN, bronchitis, anxiety/depression, chronic low back pain.  Sepsis with left ankle pain: Think infectious cause is most likely with subjective fevers and chills at home, warmth of skin, and mildly elevated WBC of 13.8.  MRI neg for osteomyolitis. DVT less likely with negative duplex US on 8/5. Although MRI negative, patient appears to be clinically worsening. Ortho believes appearance consistent with severe cellulitis although there is some concern for necrotizing cellulitis.  -start linezolid per ID - Blood cultures (no growth @3day ) - CT left ankle pending -NPO for possible sx debridement; pending evaluation by Dr. Sharol Given  - increase Dilaudid to 2mg  q2hrs prn, home dose oxycontinand Tylenol for pain - Hold off on fluids for now, as BP is hypertensive and patient is ESRD  ESRD on HD: Nephrologist is Dr. Justin Mend. MWF in [place].  -plan for HD tomorrow, 8/13 -Continue home Phoslo 2,001mg  bid with meals, Renvela 2,400mg  bid with meals, and Rena-vit daily -nephro following, appreciate input   HTN: BPs elevated 150-170/80-90.  - Hold home Hydralazine 50mg  tid and Lisinopril 40mg  daily -Norvasc 10mg  daily -increase metoprolol to 25 mg BID   -per nephro, no need to restart lasix - Hydralazine IV prn for SBP > 160  Anemia in CKD: Hgb 9.1 today. Baseline 9-11. Transfused 1unit during HD 8/10 -follow CBC   ?Bronchitis: Unclear if she has had PFTs in the past, but she smokes 1/2 ppd. Per patient, she does not have a diagnosis of COPD. Breathing comfortably on room air in the ED. -lungs sound CTA 8/11 - discuss home symbicort or ordering inpatient dulera with patient -continue Albuterol 2 puffs q6hrs prn, and Singulair 10mg  daily  Anxiety: Takes Xanax tid prn at home.  - Continue home Xanax  Chronic Low Back Pain: Takes Dilaudid 2mg  bid prn and Oxycodone 40mg  bid at home. Follows with a pain clinic in [place]. -Dilaudid 1 mg q2h prn  -Robaxin 500mg  bid  -Flexeril d/c'd due to linezolid  FEN/GI: Renal diet with fluid restriction. Prophylaxis: Heparin sq  Disposition:Admit to telemetry under inpatient status. Discharge home pending clinical improvement.  Subjective:  Crying in pain today. Reports pain and edema has worsened.   Objective: Temp:  [99.2 F (37.3 C)-99.9 F (37.7 C)] 99.2 F (37.3 C) (08/12 0725) Pulse Rate:  [91-94] 91 (08/12 0725) BP: (157-173)/(91-98) 157/98 (08/12 0725) SpO2:  [99 %-100 %] 99 % (08/12 0725) Physical Exam: General: still in visible pain, winces with any movement of leg, alert and able to converse freely Cardiovascular: RRR, still referred sounds from fistula in L arm, distal pulses intact but left DP is decreased Respiratory: lung sounds CTA with no wheezing and no IWB Abdomen: soft to palpation with no rebound tenderness and bowel sounds in all 4 quadrants Extremities: Left leg still hot to touch at distal leg, anterior erythema spread yesterday with new marking  in place, edema of left foot compared to right, distal pulse palpable by decreased, foot is cool but able to move toes, distal sensation intact but she states it is decreased  Laboratory:  Recent Labs Lab  06/10/17 0404 06/11/17 0507 06/12/17 0451  WBC 13.8* 14.7* 13.8*  HGB 6.9* 8.8* 9.1*  HCT 22.1* 28.0* 29.0*  PLT 252 254 291    Recent Labs Lab 06/08/17 0233  06/10/17 0404 06/11/17 0507 06/12/17 0451  NA 140  < > 138 138 140  K 3.9  < > 3.7 3.4* 4.2  CL 94*  < > 96* 94* 99*  CO2 29  < > 29 33* 29  BUN 35*  < > 24* 18 35*  CREATININE 6.96*  < > 5.29* 4.07* 6.34*  CALCIUM 9.8  < > 9.6 9.3 10.3  PROT 6.0*  --   --   --   --   BILITOT 0.8  --   --   --   --   ALKPHOS 94  --   --   --   --   ALT 11*  --   --   --   --   AST 13*  --   --   --   --   GLUCOSE 154*  < > 83 140* 99  < > = values in this interval not displayed.    Imaging/Diagnostic Tests: Mr Ankle Left Wo Contrast  Result Date: 06/08/2017 CLINICAL DATA:  Ankle pain of uncertain etiology. EXAM: MRI OF THE LEFT ANKLE WITHOUT CONTRAST TECHNIQUE: Multiplanar, multisequence MR imaging of the ankle was performed. No intravenous contrast was administered. COMPARISON:  None. FINDINGS: Exam is limited by patient motion artifacts. TENDONS Peroneal: Intact peroneus longus and peroneus brevis tendons. Posteromedial: A moderate to marked amount of fluid outlines the tibialis posterior tendon with intrasubstance T2 signal noted within the tendon and muscle leading up to the tendon suspicious for tenosynovitis, muscle strain and longitudinal split tear/tendinosis of the tendon (series 6, image 12, series 10 image 12, series 9 image 1 through 12 for example). The flexor digitorum longus and flexor hallucis longus tendons are intact. Anterior: Intact tibialis anterior, extensor hallucis longus and extensor digitorum longus tendons. Achilles: Intact. Plantar Fascia: Intact. LIGAMENTS Lateral: Grossly intact Medial: Grossly intact CARTILAGE Ankle Joint: No joint effusion or chondral defect. Subtalar Joints/Sinus Tarsi: No joint effusion or chondral defect. Bones: No marrow signal abnormality. No fracture or dislocation. Other: Diffuse soft  tissue edema of the included ankle and foot. IMPRESSION: 1. Moderate-to-marked amount of fluid outlining the tibialis posterior tendon with intrasubstance elevated T2 signal suspicious for intrasubstance tear and tenosynovitis. The muscle is also edematous suspicious for a strain or potentially myositis. 2. Diffuse subcutaneous edema about the included ankle and foot consistent with third spacing of fluid or cellulitis. 3. No acute marrow signal abnormalities noted. Electronically Signed   By: Ashley Royalty M.D.   On: 06/08/2017 18:07     Nicolette Bang, DO 06/12/2017, 8:45 AM PGY-3, [place] Intern pager: [phone], text pages welcome

## 2017-06-12 NOTE — Progress Notes (Signed)
[facility] Progress Note  Subjective: still w severe foot pain, CT last night showed 3 x 9 cm fluid collection post to calf muscle.   Vitals:   06/11/17 0635 06/11/17 2009 06/12/17 0725 06/12/17 0941  BP: (!) 159/104 (!) 173/91 (!) 157/98 (!) 164/84  Pulse: 99 94 91 88  Resp: 18     Temp: 99.9 F (37.7 C) 99.9 F (37.7 C) 99.2 F (37.3 C)   TempSrc: Oral Oral Oral   SpO2: 98% 100% 99%   Weight:      Height:        Inpatient medications: . acetaminophen  650 mg Oral TID  . ALPRAZolam  1 mg Oral TID  . amLODipine  10 mg Oral QHS  . calcium acetate  2,001 mg Oral BID WC  . darbepoetin (ARANESP) injection - DIALYSIS  200 mcg Intravenous Q Fri-HD  . feeding supplement (NEPRO CARB STEADY)  237 mL Oral TID BM  . linezolid  600 mg Oral Q12H  . methocarbamol  500 mg Oral BID  . metoprolol tartrate  25 mg Oral BID  . montelukast  10 mg Oral QHS  . multivitamin  1 tablet Oral QHS  . oxyCODONE  40 mg Oral Q12H  . sevelamer carbonate  2,400 mg Oral BID WC  . sodium chloride flush  3 mL Intravenous Q12H    albuterol, hydrALAZINE, HYDROmorphone (DILAUDID) injection, promethazine  Exam: Not in distress, in pain No jvd Chest clear bilat RRR no mrg w/ AVF compressed Abd soft ntnd no mass or ascites Ext L lower leg/ankle erythema improving a bit NF, ox 3 LUA AVF+bruit  Dialysis Orders:  MWF at [facility] 3.5h   65.5kg   2/2 bath  Hep none  LUA AVF - Mircera 121mcg IV q 2 weeks - Venofer 50mg  IV weekly - BMM meds: Phoslo 3/meals AND Renvela 3/meals. No VDRA.   Assessment/Plan: 1. L leg infection:  Not improving on IV abx, CT last night showing large fluid collection, per radiology "worrisome for septic tenosynovitis/abscess and pyomyositis".  NPO per ortho, awaiting their f/u today.  2. ESRD: cont on MWF HD.  3. Hypertension/volume: BP slightly high, at dry weight.  4. Anemia: Hgb up 8.8 after 2u prbc's.  SP Aranesp 252mcg 8/10 5. Metabolic bone disease: Ca/Phos  ok. Continue Phoslo/Renvela. 6. Nutrition: Alb 2.8, adding Nepro supps. 7. Chronic back pain: per primary. 8. Depression/anxiety: Home meds per primary.   Plan - HD Monday, awaiting ortho f/u   Sanja Splinter MD [facility] pager [phone]   06/12/2017, 12:49 PM    Recent Labs Lab 06/09/17 0557 06/10/17 0404 06/11/17 0507 06/12/17 0451  NA 140 138 138 140  K 4.4 3.7 3.4* 4.2  CL 96* 96* 94* 99*  CO2 28 29 33* 29  GLUCOSE 102* 83 140* 99  BUN 46* 24* 18 35*  CREATININE 8.36*  8.47* 5.29* 4.07* 6.34*  CALCIUM 9.7 9.6 9.3 10.3  PHOS 4.7*  --   --   --     Recent Labs Lab 06/08/17 0233 06/09/17 0557  AST 13*  --   ALT 11*  --   ALKPHOS 94  --   BILITOT 0.8  --   PROT 6.0*  --   ALBUMIN 2.9* 2.8*    Recent Labs Lab 06/08/17 0233  06/10/17 0404 06/11/17 0507 06/12/17 0451  WBC 12.9*  < > 13.8* 14.7* 13.8*  NEUTROABS 10.6*  --  11.2*  --   --   HGB  8.2*  < > 6.9* 8.8* 9.1*  HCT 26.9*  < > 22.1* 28.0* 29.0*  MCV 97.1  < > 96.5 92.7 94.8  PLT 299  < > 252 254 291  < > = values in this interval not displayed. Iron/TIBC/Ferritin/ %Sat    Component Value Date/Time   IRON 14 (L) 02/14/2014 2025   TIBC 149 (L) 02/14/2014 2025   FERRITIN 1,167 (H) 02/14/2014 2025   IRONPCTSAT 9 (L) 02/14/2014 2025

## 2017-06-12 NOTE — Progress Notes (Signed)
BP elevated - pt continues to c/o of 10/10 pain- tearful & bending arm & grabbing side rail- Dr Gifford Shave at bedside

## 2017-06-12 NOTE — Transfer of Care (Signed)
Immediate Anesthesia Transfer of Care Note  Patient: [name]  Procedure(s) Performed: Procedure(s): IRRIGATION AND DEBRIDEMENT FOOT (Left)  Patient Location: PACU  Anesthesia Type:General  Level of Consciousness: awake, alert  and oriented  Airway & Oxygen Therapy: Patient Spontanous Breathing and Patient connected to nasal cannula oxygen  Post-op Assessment: Report given to RN and Post -op Vital signs reviewed and stable  Post vital signs: Reviewed and stable  Last Vitals:  Vitals:   06/12/17 0941 06/12/17 1422  BP: (!) 164/84 139/84  Pulse: 88 78  Resp:  20  Temp:  37.1 C  SpO2:  98%    Last Pain:  Vitals:   06/12/17 1422  TempSrc: Oral  PainSc:       Patients Stated Pain Goal: 0 (22/24/11 4643)  Complications: No apparent anesthesia complications

## 2017-06-13 ENCOUNTER — Encounter (HOSPITAL_COMMUNITY): Payer: Self-pay | Admitting: Orthopedic Surgery

## 2017-06-13 DIAGNOSIS — M659 Synovitis and tenosynovitis, unspecified: Secondary | ICD-10-CM

## 2017-06-13 DIAGNOSIS — L03116 Cellulitis of left lower limb: Secondary | ICD-10-CM

## 2017-06-13 DIAGNOSIS — M65072 Abscess of tendon sheath, left ankle and foot: Secondary | ICD-10-CM

## 2017-06-13 LAB — CULTURE, BLOOD (ROUTINE X 2)
CULTURE: NO GROWTH
Culture: NO GROWTH
SPECIAL REQUESTS: ADEQUATE
SPECIAL REQUESTS: ADEQUATE

## 2017-06-13 LAB — BASIC METABOLIC PANEL
ANION GAP: 12 (ref 5–15)
BUN: 45 mg/dL — ABNORMAL HIGH (ref 6–20)
CHLORIDE: 100 mmol/L — AB (ref 101–111)
CO2: 27 mmol/L (ref 22–32)
Calcium: 9.4 mg/dL (ref 8.9–10.3)
Creatinine, Ser: 7.81 mg/dL — ABNORMAL HIGH (ref 0.44–1.00)
GFR calc non Af Amer: 6 mL/min — ABNORMAL LOW (ref >60)
GFR, EST AFRICAN AMERICAN: 7 mL/min — AB (ref >60)
Glucose, Bld: 98 mg/dL (ref 65–99)
Potassium: 4.3 mmol/L (ref 3.5–5.1)
Sodium: 139 mmol/L (ref 135–145)

## 2017-06-13 LAB — CBC
HEMATOCRIT: 27.2 % — AB (ref 36.0–46.0)
HEMOGLOBIN: 8.5 g/dL — AB (ref 12.0–15.0)
MCH: 29.7 pg (ref 26.0–34.0)
MCHC: 31.3 g/dL (ref 30.0–36.0)
MCV: 95.1 fL (ref 78.0–100.0)
Platelets: 263 10*3/uL (ref 150–400)
RBC: 2.86 MIL/uL — ABNORMAL LOW (ref 3.87–5.11)
RDW: 16.3 % — ABNORMAL HIGH (ref 11.5–15.5)
WBC: 12.4 10*3/uL — AB (ref 4.0–10.5)

## 2017-06-13 MED ORDER — HYDROMORPHONE HCL 2 MG PO TABS
2.0000 mg | ORAL_TABLET | ORAL | Status: DC | PRN
  Administered 2017-06-13 – 2017-06-14 (×7): 2 mg via ORAL
  Filled 2017-06-13 (×8): qty 1

## 2017-06-13 MED ORDER — CEFAZOLIN SODIUM-DEXTROSE 2-4 GM/100ML-% IV SOLN
2.0000 g | INTRAVENOUS | Status: AC
  Administered 2017-06-14: 2 g via INTRAVENOUS
  Filled 2017-06-13 (×2): qty 100

## 2017-06-13 MED ORDER — OXYCODONE HCL 5 MG PO TABS
ORAL_TABLET | ORAL | Status: AC
  Filled 2017-06-13: qty 8

## 2017-06-13 MED ORDER — PRO-STAT SUGAR FREE PO LIQD
30.0000 mL | Freq: Three times a day (TID) | ORAL | Status: DC
  Administered 2017-06-13 – 2017-06-14 (×3): 30 mL via ORAL
  Filled 2017-06-13: qty 30

## 2017-06-13 NOTE — Progress Notes (Signed)
S: Pt seen on HD.  Pain a little better.  Feels hungry.  No SOB   AP 170 Vp 150 BFR 400 O:BP 135/77   Pulse 84   Temp 98 F (36.7 C) (Oral)   Resp 13   Ht 5\' 4"  (1.626 m)   Wt 64.5 kg (142 lb 3.2 oz) Comment: Bed Scale  SpO2 100%   BMI 24.41 kg/m   Intake/Output Summary (Last 24 hours) at 06/13/17 6546 Last data filed at 06/13/17 0647  Gross per 24 hour  Intake              550 ml  Output              160 ml  Net              390 ml   Weight change:  TKP:TWSFK and alert CVS: RRR Resp: Clear Abd: +BS NTND Ext: Vac Lt lower leg, erythematous skin  LUA AVF NEURO:CNI Ox3 No asterixis   . ALPRAZolam  1 mg Oral TID  . amLODipine  10 mg Oral QHS  . buPROPion  150 mg Oral Daily  . calcium acetate  2,001 mg Oral BID WC  . darbepoetin (ARANESP) injection - DIALYSIS  200 mcg Intravenous Q Fri-HD  . docusate sodium  100 mg Oral BID  . DULoxetine  60 mg Oral Daily  . feeding supplement (NEPRO CARB STEADY)  237 mL Oral TID BM  . HYDROmorphone  2 mg Oral Q4H  . linezolid  600 mg Oral Q12H  . methocarbamol  500 mg Oral BID  . metoprolol tartrate  25 mg Oral BID  . montelukast  10 mg Oral QHS  . multivitamin  1 tablet Oral QHS  . oxyCODONE  40 mg Oral Q12H  . senna  1 tablet Oral BID  . sevelamer carbonate  2,400 mg Oral BID WC  . sodium chloride flush  3 mL Intravenous Q12H   Ct Ankle Left Wo Contrast  Result Date: 06/12/2017 CLINICAL DATA:  Followup ankle pain. EXAM: CT OF THE LEFT ANKLE WITHOUT CONTRAST TECHNIQUE: Multidetector CT imaging of the left ankle was performed according to the standard protocol. Multiplanar CT image reconstructions were also generated. COMPARISON:  MRI 06/08/2017 FINDINGS: There is persistent diffuse subcutaneous soft tissue swelling/ edema/ fluid suggesting cellulitis. There is also muscular edema suggesting myofasciitis. No gas is seen in the soft tissues. Persistent fluid collection surrounding the posterior tibialis tendon and extending up into the  musculotendinous junction region. This could reflect septic tenosynovitis and abscess. Lower posterior tibialis pyomyositis is also likely. No definite CT findings to suggest septic arthritis or osteomyelitis in the ankle or foot. Type 2 os naviculare noted incidentally. No fractures or osteochondral lesions. Scattered dystrophic calcifications are noted. There is also some intrasubstance calcifications in the distal aspect of the posterior tibialis tendon. IMPRESSION: 1. Large fluid collection surrounding an abnormal posterior tibialis tendon extending up into the musculotendinous junction region and into the muscle. This measures a maximum of 9.3 x 2.0 x 3.5 cm and is worrisome for septic tenosynovitis/abscess and pyomyositis. 2. No definite CT findings for septic arthritis or osteomyelitis. 3. Persistent diffuse cellulitis and myofasciitis. Electronically Signed   By: Marijo Sanes M.D.   On: 06/12/2017 09:27   BMET    Component Value Date/Time   NA 139 06/13/2017 0549   K 4.3 06/13/2017 0549   CL 100 (L) 06/13/2017 0549   CO2 27 06/13/2017 0549   GLUCOSE 98 06/13/2017 0549  BUN 45 (H) 06/13/2017 0549   CREATININE 7.81 (H) 06/13/2017 0549   CALCIUM 9.4 06/13/2017 0549   CALCIUM 9.3 01/03/2012 1109   GFRNONAA 6 (L) 06/13/2017 0549   GFRAA 7 (L) 06/13/2017 0549   CBC    Component Value Date/Time   WBC 12.4 (H) 06/13/2017 0549   RBC 2.86 (L) 06/13/2017 0549   HGB 8.5 (L) 06/13/2017 0549   HCT 27.2 (L) 06/13/2017 0549   PLT 263 06/13/2017 0549   MCV 95.1 06/13/2017 0549   MCH 29.7 06/13/2017 0549   MCHC 31.3 06/13/2017 0549   RDW 16.3 (H) 06/13/2017 0549   LYMPHSABS 1.8 06/10/2017 0404   MONOABS 0.7 06/10/2017 0404   EOSABS 0.0 06/10/2017 0404   BASOSABS 0.0 06/10/2017 0404     Assessment:  1. Lt leg/ankle abscess  Gm pos cocci on gram stain 2. HTN.  BP under better control this AM with control of pain 3. Anemia on aranesp 4. ESRD  MWF [place]  Plan: 1. Cont AB and await  final cultures 2.  HD MWF   [name] T

## 2017-06-13 NOTE — Progress Notes (Signed)
Nutrition Follow-up  DOCUMENTATION CODES:   Not applicable  INTERVENTION:   -D/c Nepro Shake po TID, each supplement provides 425 kcal and 19 grams protein -30 ml Prostat TID, each supplement provides 100 kcals and 15 grams protein -Continue renal MVI daily  NUTRITION DIAGNOSIS:   Predicted suboptimal nutrient intake related to poor appetite, chronic illness as evidenced by meal completion < 25%, percent weight loss.  Ongoing  GOAL:   Patient will meet greater than or equal to 90% of their needs  Progressing  MONITOR:   PO intake, Supplement acceptance, Labs, Weight trends, Skin, I & O's  REASON FOR ASSESSMENT:   Consult Assessment of nutrition requirement/status  ASSESSMENT:   [name] is a 46 y.o. female presenting with left ankle pain and swelling. PMH is significant for ESRD on HD, HTN, bronchitis, anxiety/depression, chronic low back pain.  S/p PROCEDURE 8/12:  Left leg incision, irrigation, debridement, with evacuation of large abscess of the posterior tibial tendon sheath, and application of small wound VAC, measuring 12 cm x 3 cm, instillational VAC/Provina  Attempted to assess pt x 3, however, pt either off unit, receiving nursing care, or in bathroom at time of visit.   Intake variable per doc flowsheets (PO: 075%). Pt has been refusing Nepro supplements. Nutritional needs adjusted and increased due to wound vac.   Labs reviewed.   Diet Order:  Diet renal with fluid restriction Fluid restriction: 1200 mL Fluid; Room service appropriate? Yes; Fluid consistency: Thin  Skin:  Wound (see comment) (closed lt leg incision w wound vac)  Last BM:  06/11/17  Height:   Ht Readings from Last 1 Encounters:  06/09/17 5\' 4"  (1.626 m)    Weight:   Wt Readings from Last 1 Encounters:  06/13/17 142 lb 3.2 oz (64.5 kg)    Ideal Body Weight:  54.5 kg  BMI:  Body mass index is 24.41 kg/m.  Estimated Nutritional Needs:   Kcal:   6754-4920  Protein:  85-100 grams  Fluid:  1000 ml + UOP  EDUCATION NEEDS:   Education needs no appropriate at this time  Benedetta Sundstrom A. Jimmye Norman, RD, LDN, CDE Pager: [phone] After hours Pager: [phone]

## 2017-06-13 NOTE — Progress Notes (Signed)
Report given to dialysis RN Jabier.

## 2017-06-13 NOTE — Evaluation (Signed)
Physical Therapy Evaluation Patient Details Name: [name] MRN: [id number] DOB: 10-24-1971 Today's Date: 06/13/2017   History of Present Illness  Pt is a 46 y.o. female with PMH significant for ESRD, hypertension, gout, GERD, asthma, anemia, and chronic pain who presented to the ED with swelling of L ankle x2 weeks. CT revealed large fluid collection surrounding abnormal posterior tibialis tendon. Pt additionally with 2 week history of abdominal pain and vomiting. She is now s/p irrigation and debridement L foot 06/12/17.   Clinical Impression  Pt admitted with/for complications of post tibialis tendon stated above. S/p I and D of L foot.  Pt needing min assist for general mobility.  Pt currently limited functionally due to the problems listed below.  (see problems list.)  Pt will benefit from PT to maximize function and safety to be able to get home safely with available assist of family.     Follow Up Recommendations Home health PT    Equipment Recommendations       Recommendations for Other Services       Precautions / Restrictions Precautions Precautions: Fall Precaution Comments: Wound vac Restrictions Weight Bearing Restrictions: No      Mobility  Bed Mobility Overal bed mobility: Needs Assistance Bed Mobility: Supine to Sit     Supine to sit: Min guard     General bed mobility comments: Min guard for safety with VC's for technique.   Transfers Overall transfer level: Needs assistance Equipment used: 4-wheeled walker Transfers: Sit to/from Stand Sit to Stand: Min assist         General transfer comment: Min assist to steady and power up.   Ambulation/Gait             General Gait Details: pt unable to bear the pain to attempt steps  Stairs            Wheelchair Mobility    Modified Rankin (Stroke Patients Only)       Balance Overall balance assessment: Needs assistance Sitting-balance support: No upper extremity  supported;Feet supported Sitting balance-Leahy Scale: Fair     Standing balance support: Bilateral upper extremity supported;During functional activity Standing balance-Leahy Scale: Poor Standing balance comment: Reliant on B UE support.                              Pertinent Vitals/Pain Pain Assessment: 0-10 Pain Score: 10-Worst pain ever Pain Location: L ankle Pain Descriptors / Indicators: Constant;Aching;Operative site guarding Pain Intervention(s): Limited activity within patient's tolerance    Home Living Family/patient expects to be discharged to:: Private residence Living Arrangements: Spouse/significant other Available Help at Discharge: Family;Available 24 hours/day (niece can also assist) Type of Home: Other(Comment) (Duplex) Home Access: Stairs to enter   Entrance Stairs-Number of Steps: 1+1   Home Equipment: Walker - 4 wheels;Shower seat      Prior Function Level of Independence: Independent         Comments: Drives. Does not work due to HD.     Hand Dominance   Dominant Hand: Right    Extremity/Trunk Assessment   Upper Extremity Assessment Upper Extremity Assessment: Defer to OT evaluation    Lower Extremity Assessment Lower Extremity Assessment: LLE deficits/detail LLE Deficits / Details: Wound vac L medial ankle.  LLE: Unable to fully assess due to pain       Communication   Communication: No difficulties  Cognition Arousal/Alertness: Awake/alert;Lethargic (initially lethargic but more awake as session progressed)  Behavior During Therapy: WFL for tasks assessed/performed Overall Cognitive Status: Within Functional Limits for tasks assessed                                 General Comments: Pt with significant pain and concern for family situation she learned of during session.       General Comments      Exercises     Assessment/Plan    PT Assessment Patient needs continued PT services  PT Problem List  Decreased strength;Decreased range of motion       PT Treatment Interventions Gait training;Functional mobility training;Therapeutic activities;Therapeutic exercise;Patient/family education    PT Goals (Current goals can be found in the Care Plan section)  Acute Rehab PT Goals Patient Stated Goal: less pain PT Goal Formulation: With patient Time For Goal Achievement: 06/20/17 Potential to Achieve Goals: Good    Frequency Min 4X/week   Barriers to discharge        Co-evaluation PT/OT/SLP Co-Evaluation/Treatment: Yes Reason for Co-Treatment: Complexity of the patient's impairments (multi-system involvement);To address functional/ADL transfers PT goals addressed during session: Mobility/safety with mobility         AM-PAC PT "6 Clicks" Daily Activity  Outcome Measure Difficulty turning over in bed (including adjusting bedclothes, sheets and blankets)?: A Lot Difficulty moving from lying on back to sitting on the side of the bed? : A Lot Difficulty sitting down on and standing up from a chair with arms (e.g., wheelchair, bedside commode, etc,.)?: A Little Help needed moving to and from a bed to chair (including a wheelchair)?: A Lot Help needed walking in hospital room?: A Lot Help needed climbing 3-5 steps with a railing? : A Lot 6 Click Score: 13    End of Session   Activity Tolerance: Patient limited by pain Patient left: in bed;with call bell/phone within reach;with family/visitor present Nurse Communication: Mobility status PT Visit Diagnosis: Other abnormalities of gait and mobility (R26.89);Pain Pain - Right/Left: Left Pain - part of body: Ankle and joints of foot    Time: 1635-1700 PT Time Calculation (min) (ACUTE ONLY): 25 min   Charges:   PT Evaluation $PT Eval Moderate Complexity: 1 Mod     PT G Codes:        21-Jun-2017  Donnella Sham, PT [phone]  (pager)  Tessie Fass Denai Caba 21-Jun-2017, 6:11 PM

## 2017-06-13 NOTE — Progress Notes (Addendum)
PT Cancellation Note  Patient Details Name: [name] MRN: [id number] DOB: October 08, 1971   Cancelled Treatment:    Reason Eval/Treat Not Completed: Patient at procedure or test/unavailable.  In HD on arrival.  Will see as able later. 06/13/2017  Donnella Sham, [place] [phone]  (pager)   [name] 06/13/2017, 9:40 AM

## 2017-06-13 NOTE — Evaluation (Signed)
Occupational Therapy Evaluation Patient Details Name: [name] MRN: [id number] DOB: 06/19/1971 Today's Date: 06/13/2017    History of Present Illness Pt is a 46 y.o. female with PMH significant for ESRD, hypertension, gout, GERD, asthma, anemia, and chronic pain who presented to the ED with swelling of L ankle x2 weeks. CT revealed large fluid collection surrounding abnormal posterior tibialis tendon. Pt additionally with 2 week history of abdominal pain and vomiting. She is now s/p irrigation and debridement L foot 06/12/17.    Clinical Impression   PTA, pt was independent with ADL and functional mobility and had begun to use Rollator due to increasing pain in LLE. She currently requires min assist for LB ADL, standing ADL, and toilet transfers. Pt presents with significant L LE pain impacting her ability to participate in ADL at PLOF. She would benefit from OT services while admitted in order to maximize safety and independence with ADL and functional mobility prior to return home. Pt will have 24 hour assistance available post-acute D/C and feel she will likely need no OT follow-up post-acute D/C. Will continue to follow while admitted.    Follow Up Recommendations  No OT follow up;Supervision/Assistance - 24 hour    Equipment Recommendations  3 in 1 bedside commode    Recommendations for Other Services       Precautions / Restrictions Precautions Precautions: Fall Precaution Comments: Wound vac Restrictions Weight Bearing Restrictions: No      Mobility Bed Mobility Overal bed mobility: Needs Assistance Bed Mobility: Supine to Sit     Supine to sit: Min guard     General bed mobility comments: Min guard for safety with VC's for technique.   Transfers Overall transfer level: Needs assistance Equipment used: 4-wheeled walker Transfers: Sit to/from Stand Sit to Stand: Min assist         General transfer comment: Min assist to steady and power up.      Balance Overall balance assessment: Needs assistance Sitting-balance support: No upper extremity supported;Feet supported Sitting balance-Leahy Scale: Fair     Standing balance support: Bilateral upper extremity supported;During functional activity Standing balance-Leahy Scale: Poor Standing balance comment: Reliant on B UE support.                            ADL either performed or assessed with clinical judgement   ADL Overall ADL's : Needs assistance/impaired Eating/Feeding: Set up;Bed level   Grooming: Set up;Sitting   Upper Body Bathing: Min guard;Sitting   Lower Body Bathing: Minimal assistance;Sit to/from stand   Upper Body Dressing : Min guard;Sitting   Lower Body Dressing: Minimal assistance;Sit to/from stand   Toilet Transfer: Minimal assistance;Stand-pivot (with Rollator) Toilet Transfer Details (indicate cue type and reason): able to take a few "hops"  Toileting- Clothing Manipulation and Hygiene: Minimal assistance;Sit to/from stand       Functional mobility during ADLs: Minimal assistance;Rolling walker (Rollator) General ADL Comments: Pt limited by pain this session but willing to participate.      Vision Baseline Vision/History: Wears glasses Wears Glasses: At all times Patient Visual Report: No change from baseline Vision Assessment?: No apparent visual deficits     Perception     Praxis      Pertinent Vitals/Pain Pain Assessment: 0-10 Pain Score: 10-Worst pain ever Pain Location: L ankle Pain Descriptors / Indicators: Constant;Aching;Operative site guarding Pain Intervention(s): Limited activity within patient's tolerance;Monitored during session;Repositioned     Hand Dominance Right  Extremity/Trunk Assessment Upper Extremity Assessment Upper Extremity Assessment: Overall WFL for tasks assessed   Lower Extremity Assessment Lower Extremity Assessment: LLE deficits/detail LLE Deficits / Details: Wound vac L medial ankle.   LLE: Unable to fully assess due to pain       Communication Communication Communication: No difficulties   Cognition Arousal/Alertness: Awake/alert;Lethargic (initially lethargic but more awake as session progressed) Behavior During Therapy: WFL for tasks assessed/performed Overall Cognitive Status: Within Functional Limits for tasks assessed                                 General Comments: Pt with significant pain and concern for family situation she learned of during session.    General Comments       Exercises     Shoulder Instructions      Home Living Family/patient expects to be discharged to:: Private residence Living Arrangements: Spouse/significant other Available Help at Discharge: Family;Available 24 hours/day (niece can also assist) Type of Home: Other(Comment) (Duplex) Home Access: Stairs to enter Entrance Stairs-Number of Steps: 1+1         Bathroom Shower/Tub: Tub/shower unit;Curtain   Biochemist, clinical: Standard     Home Equipment: Environmental consultant - 4 wheels;Shower seat          Prior Functioning/Environment Level of Independence: Independent        Comments: Drives. Does not work due to HD.        OT Problem List: Decreased strength;Decreased range of motion;Decreased activity tolerance;Impaired balance (sitting and/or standing);Decreased safety awareness;Decreased knowledge of use of DME or AE;Decreased knowledge of precautions;Pain      OT Treatment/Interventions: Self-care/ADL training;Therapeutic exercise;Energy conservation;DME and/or AE instruction;Therapeutic activities;Patient/family education;Balance training    OT Goals(Current goals can be found in the care plan section) Acute Rehab OT Goals Patient Stated Goal: less pain OT Goal Formulation: With patient/family Time For Goal Achievement: 06/27/17 Potential to Achieve Goals: Good ADL Goals Pt Will Perform Grooming: with supervision;standing Pt Will Perform Lower Body  Dressing: with supervision;sit to/from stand Pt Will Transfer to Toilet: with supervision;ambulating;regular height toilet;bedside commode (with Rollator) Pt Will Perform Toileting - Clothing Manipulation and hygiene: with supervision;sit to/from stand  OT Frequency: Min 2X/week   Barriers to D/C:            Co-evaluation              AM-PAC PT "6 Clicks" Daily Activity     Outcome Measure Help from another person eating meals?: None Help from another person taking care of personal grooming?: A Little Help from another person toileting, which includes using toliet, bedpan, or urinal?: A Little Help from another person bathing (including washing, rinsing, drying)?: A Little Help from another person to put on and taking off regular upper body clothing?: A Little Help from another person to put on and taking off regular lower body clothing?: A Little 6 Click Score: 19   End of Session Equipment Utilized During Treatment: Gait belt;Rolling walker Agricultural consultant) Nurse Communication: Mobility status;Other (comment);Patient requests pain meds (pt on bedpan)  Activity Tolerance: Patient tolerated treatment well Patient left: in bed;with call bell/phone within reach;with family/visitor present  OT Visit Diagnosis: Muscle weakness (generalized) (M62.81);Unsteadiness on feet (R26.81);Pain Pain - Right/Left: Left Pain - part of body: Leg                Time: 1245-8099 OT Time Calculation (min): 25 min Charges:  OT General Charges $OT  Visit: 1 Procedure OT Evaluation $OT Eval Moderate Complexity: 1 Procedure G-Codes:     Norman Herrlich, MS OTR/L  Pager: Klemme A Perina Salvaggio 06/13/2017, 5:53 PM

## 2017-06-13 NOTE — Progress Notes (Signed)
OT Cancellation Note  Patient Details Name: [name] MRN: [id number] DOB: 1971/05/17   Cancelled Treatment:    Reason Eval/Treat Not Completed: Patient at procedure or test/ unavailable. Pt off unit for HD. Will check back as able to complete OT evaluation.   Norman Herrlich, MS OTR/L  Pager: [phone]   Norman Herrlich 06/13/2017, 7:24 AM

## 2017-06-13 NOTE — Progress Notes (Signed)
Family Medicine Teaching Service Daily Progress Note Intern Pager: [phone]  Patient name: [name] Medical record number: [id number] Date of birth: 06/11/1971 Age: 46 y.o. Gender: female  Primary Care Provider: Greig Right, MD Consultants: ortho, nephro, ID Code Status: full  Pt Overview and Major Events to Date:  [name] a 46 y.o.femalepresenting with left ankle pain and swelling. PMH is significant for ESRD on HD, HTN, bronchitis, anxiety/depression, chronic low back pain. Initial MRI showed potential tear to tibialis tendon/tendosynovitis and fluid surrounding joint but no marrow process.   Followup CT showed abscess within tendon and surgical drainage was performed, now with wound vac.  Assessment and Plan: [name] a 46 y.o.femalepresenting with left ankle pain and swelling. PMH is significant for ESRD on HD, HTN, bronchitis, anxiety/depression, chronic low back pain.  Sepsis with left ankle pain:  Although MRI was negative, followup CT showed abscess had developed.  Now s/p surgical drainage w/ wound vac -linezolid per ID - Blood cultures (no growth @4day  from 8/8) -Abscess cultures pending from 8/12 (+for gram + cocci - wean  Dilaudid to 2mg  q2hrs prn oral, home dose oxycontinand Tylenol for pain - Hold off on fluids for now, as BP is hypertensive and patient is ESRD  ESRD on HD: Nephrologist is Dr. Justin Mend. MWF in [place].  -plan for HD today (8/13) -Continue home Phoslo 2,001mg  bid with meals, Renvela 2,400mg  bid with meals, and Rena-vit daily -nephro following, appreciate input   HTN: BPs elevated 150-170/80-90.  -Hold home Hydralazine 50mg  tid and Lisinopril 40mg  daily -Norvasc 10mg  daily -increase metoprolol to 25 mg BID  -per nephro, no need to restart lasix - Hydralazine IV prn for SBP > 160  Anemia in CKD: Hgb 8.5 today. Baseline 9-11. Transfused 1unit during HD 8/10 -follow CBC   ?Bronchitis: Unclear if  she has had PFTs in the past, but she smokes 1/2 ppd. Per patient, she does not have a diagnosis of COPD. Breathing comfortably on room air in the ED. -lungs sound CTA 8/11 -continue Albuterol 2 puffs q6hrs prn, and Singulair 10mg  daily  Anxiety: Takes Xanax tid prn at home.  - Continue home Xanax  Chronic Low Back Pain: Takes Dilaudid 2mg  bid prn and Oxycodone 40mg  bid at home. Follows with a pain clinic in [place]. -Dilaudid 2 mg q2h prn  -Robaxin 500mg  bid  -Flexeril d/c'd due to linezolid  FEN/GI: Renal diet with fluid restriction. Prophylaxis: Heparin sq  Disposition:Admit to telemetry under inpatient status. Discharge home pending clinical improvement.  Subjective:  Pain now under control post surgery.   She is worried about the aesthetics of the wound vac but says it does feel much better.   Is happy to have had "something" done.  Objective: Temp:  [97.7 F (36.5 C)-101.1 F (38.4 C)] 98.2 F (36.8 C) (08/13 0526) Pulse Rate:  [78-102] 87 (08/13 0526) Resp:  [16-27] 18 (08/13 0526) BP: (117-197)/(64-114) 129/68 (08/13 0526) SpO2:  [96 %-100 %] 100 % (08/13 0526) Physical Exam: General: alert and able to converse freely, no obvious pain, much more comfortable than past days Cardiovascular: RRR, still referred sounds from fistula in L arm, distal pulses intact but left DP is decreased Respiratory: lung sounds CTA with slight wheezing and no IWB Abdomen: soft to palpation with no rebound tenderness and bowel sounds in all 4 quadrants Extremities: Left leg with wound vac on interior lower calf, much less warm to the touch today, drainage uniformly red,  distal pulse palpable and symetrical,  able to move toes, distal sensation intact but she states it is decreased  Laboratory:  Recent Labs Lab 06/10/17 0404 06/11/17 0507 06/12/17 0451  WBC 13.8* 14.7* 13.8*  HGB 6.9* 8.8* 9.1*  HCT 22.1* 28.0* 29.0*  PLT 252 254 291    Recent Labs Lab 06/08/17 0233   06/11/17 0507 06/12/17 0451 06/13/17 0549  NA 140  < > 138 140 139  K 3.9  < > 3.4* 4.2 4.3  CL 94*  < > 94* 99* 100*  CO2 29  < > 33* 29 27  BUN 35*  < > 18 35* 45*  CREATININE 6.96*  < > 4.07* 6.34* 7.81*  CALCIUM 9.8  < > 9.3 10.3 9.4  PROT 6.0*  --   --   --   --   BILITOT 0.8  --   --   --   --   ALKPHOS 94  --   --   --   --   ALT 11*  --   --   --   --   AST 13*  --   --   --   --   GLUCOSE 154*  < > 140* 99 98  < > = values in this interval not displayed.    Imaging/Diagnostic Tests: Mr Ankle Left Wo Contrast  Result Date: 06/08/2017 CLINICAL DATA:  Ankle pain of uncertain etiology. EXAM: MRI OF THE LEFT ANKLE WITHOUT CONTRAST TECHNIQUE: Multiplanar, multisequence MR imaging of the ankle was performed. No intravenous contrast was administered. COMPARISON:  None. FINDINGS: Exam is limited by patient motion artifacts. TENDONS Peroneal: Intact peroneus longus and peroneus brevis tendons. Posteromedial: A moderate to marked amount of fluid outlines the tibialis posterior tendon with intrasubstance T2 signal noted within the tendon and muscle leading up to the tendon suspicious for tenosynovitis, muscle strain and longitudinal split tear/tendinosis of the tendon (series 6, image 12, series 10 image 12, series 9 image 1 through 12 for example). The flexor digitorum longus and flexor hallucis longus tendons are intact. Anterior: Intact tibialis anterior, extensor hallucis longus and extensor digitorum longus tendons. Achilles: Intact. Plantar Fascia: Intact. LIGAMENTS Lateral: Grossly intact Medial: Grossly intact CARTILAGE Ankle Joint: No joint effusion or chondral defect. Subtalar Joints/Sinus Tarsi: No joint effusion or chondral defect. Bones: No marrow signal abnormality. No fracture or dislocation. Other: Diffuse soft tissue edema of the included ankle and foot. IMPRESSION: 1. Moderate-to-marked amount of fluid outlining the tibialis posterior tendon with intrasubstance elevated T2  signal suspicious for intrasubstance tear and tenosynovitis. The muscle is also edematous suspicious for a strain or potentially myositis. 2. Diffuse subcutaneous edema about the included ankle and foot consistent with third spacing of fluid or cellulitis. 3. No acute marrow signal abnormalities noted. Electronically Signed   By: Ashley Royalty M.D.   On: 06/08/2017 18:07     Sherene Sires, DO 06/13/2017, 6:50 AM PGY-1, [place] Intern pager: [phone], text pages welcome

## 2017-06-14 ENCOUNTER — Inpatient Hospital Stay (HOSPITAL_COMMUNITY): Payer: Medicare Other | Admitting: Certified Registered Nurse Anesthetist

## 2017-06-14 ENCOUNTER — Encounter (HOSPITAL_COMMUNITY)
Admission: EM | Disposition: A | Discharge status: Home / Self Care | Payer: Self-pay | Source: Home / Self Care | Attending: Family Medicine

## 2017-06-14 ENCOUNTER — Encounter (HOSPITAL_COMMUNITY): Payer: Self-pay | Admitting: Certified Registered Nurse Anesthetist

## 2017-06-14 HISTORY — PX: I&D EXTREMITY: SHX5045

## 2017-06-14 LAB — BASIC METABOLIC PANEL
Anion gap: 9 (ref 5–15)
BUN: 24 mg/dL — AB (ref 6–20)
CHLORIDE: 95 mmol/L — AB (ref 101–111)
CO2: 33 mmol/L — AB (ref 22–32)
Calcium: 9.7 mg/dL (ref 8.9–10.3)
Creatinine, Ser: 4.84 mg/dL — ABNORMAL HIGH (ref 0.44–1.00)
GFR calc Af Amer: 12 mL/min — ABNORMAL LOW (ref >60)
GFR, EST NON AFRICAN AMERICAN: 10 mL/min — AB (ref >60)
GLUCOSE: 108 mg/dL — AB (ref 65–99)
POTASSIUM: 3.7 mmol/L (ref 3.5–5.1)
Sodium: 137 mmol/L (ref 135–145)

## 2017-06-14 LAB — CBC
HEMATOCRIT: 27.2 % — AB (ref 36.0–46.0)
Hemoglobin: 8.5 g/dL — ABNORMAL LOW (ref 12.0–15.0)
MCH: 29.6 pg (ref 26.0–34.0)
MCHC: 31.3 g/dL (ref 30.0–36.0)
MCV: 94.8 fL (ref 78.0–100.0)
PLATELETS: 266 10*3/uL (ref 150–400)
RBC: 2.87 MIL/uL — AB (ref 3.87–5.11)
RDW: 15.6 % — ABNORMAL HIGH (ref 11.5–15.5)
WBC: 9.9 10*3/uL (ref 4.0–10.5)

## 2017-06-14 SURGERY — IRRIGATION AND DEBRIDEMENT EXTREMITY
Anesthesia: General | Laterality: Left

## 2017-06-14 MED ORDER — CHLORHEXIDINE GLUCONATE 4 % EX LIQD
60.0000 mL | Freq: Once | CUTANEOUS | Status: DC
  Filled 2017-06-14: qty 60

## 2017-06-14 MED ORDER — DOXYCYCLINE HYCLATE 100 MG PO TABS: 100.0000 mg | ORAL_TABLET | Freq: Two times a day (BID) | ORAL | 0 refills | Status: DC

## 2017-06-14 MED ORDER — MIDAZOLAM HCL 5 MG/5ML IJ SOLN
INTRAMUSCULAR | Status: DC | PRN
  Administered 2017-06-14: 2 mg via INTRAVENOUS

## 2017-06-14 MED ORDER — SODIUM CHLORIDE 0.9 % IV SOLN
INTRAVENOUS | Status: DC
  Administered 2017-06-14: 11:00:00 via INTRAVENOUS

## 2017-06-14 MED ORDER — 0.9 % SODIUM CHLORIDE (POUR BTL) OPTIME
TOPICAL | Status: DC | PRN
  Administered 2017-06-14: 1000 mL

## 2017-06-14 MED ORDER — HYDROMORPHONE HCL 2 MG PO TABS: 2.0000 mg | ORAL_TABLET | ORAL | 0 refills | Status: AC | PRN

## 2017-06-14 MED ORDER — HYDROMORPHONE HCL 1 MG/ML IJ SOLN
0.2500 mg | INTRAMUSCULAR | Status: DC | PRN
  Administered 2017-06-14 (×4): 0.5 mg via INTRAVENOUS

## 2017-06-14 MED ORDER — LACTATED RINGERS IV SOLN: INTRAVENOUS | Status: DC

## 2017-06-14 MED ORDER — DOXYCYCLINE HYCLATE 100 MG PO TABS
100.0000 mg | ORAL_TABLET | Freq: Two times a day (BID) | ORAL | Status: DC
  Administered 2017-06-14: 100 mg via ORAL
  Filled 2017-06-14: qty 1

## 2017-06-14 MED ORDER — ACETAMINOPHEN 325 MG PO TABS: 650.0000 mg | ORAL_TABLET | Freq: Three times a day (TID) | ORAL | 0 refills | Status: AC | PRN

## 2017-06-14 MED ORDER — METOPROLOL TARTRATE 25 MG PO TABS: 25.0000 mg | ORAL_TABLET | Freq: Two times a day (BID) | ORAL | 0 refills | Status: DC

## 2017-06-14 MED ORDER — FENTANYL CITRATE (PF) 100 MCG/2ML IJ SOLN
INTRAMUSCULAR | Status: DC | PRN
  Administered 2017-06-14: 50 ug via INTRAVENOUS
  Administered 2017-06-14: 100 ug via INTRAVENOUS

## 2017-06-14 MED ORDER — SODIUM CHLORIDE 0.9 % IR SOLN
Status: DC | PRN
  Administered 2017-06-14: 3000 mL

## 2017-06-14 MED ORDER — FENTANYL CITRATE (PF) 250 MCG/5ML IJ SOLN
INTRAMUSCULAR | Status: AC
  Filled 2017-06-14: qty 5

## 2017-06-14 MED ORDER — HYDROMORPHONE HCL 1 MG/ML IJ SOLN
INTRAMUSCULAR | Status: AC
  Administered 2017-06-14: 0.5 mg via INTRAVENOUS
  Filled 2017-06-14: qty 1

## 2017-06-14 MED ORDER — MIDAZOLAM HCL 2 MG/2ML IJ SOLN
INTRAMUSCULAR | Status: AC
  Filled 2017-06-14: qty 2

## 2017-06-14 MED ORDER — LIDOCAINE HCL (CARDIAC) 20 MG/ML IV SOLN
INTRAVENOUS | Status: DC | PRN
  Administered 2017-06-14: 60 mg via INTRAVENOUS

## 2017-06-14 MED ORDER — PROPOFOL 10 MG/ML IV BOLUS
INTRAVENOUS | Status: DC | PRN
  Administered 2017-06-14: 120 mg via INTRAVENOUS

## 2017-06-14 MED ORDER — ONDANSETRON HCL 4 MG/2ML IJ SOLN
INTRAMUSCULAR | Status: DC | PRN
  Administered 2017-06-14: 4 mg via INTRAVENOUS

## 2017-06-14 SURGICAL SUPPLY — 59 items
BANDAGE ACE 4X5 VEL STRL LF (GAUZE/BANDAGES/DRESSINGS) ×2 IMPLANT
BANDAGE ACE 6X5 VEL STRL LF (GAUZE/BANDAGES/DRESSINGS) ×2 IMPLANT
BANDAGE ELASTIC 4 VELCRO ST LF (GAUZE/BANDAGES/DRESSINGS) ×2 IMPLANT
BANDAGE ESMARK 6X9 LF (GAUZE/BANDAGES/DRESSINGS) IMPLANT
BLADE SURG 10 STRL SS (BLADE) ×2 IMPLANT
BNDG CMPR 9X4 STRL LF SNTH (GAUZE/BANDAGES/DRESSINGS)
BNDG COHESIVE 4X5 TAN STRL (GAUZE/BANDAGES/DRESSINGS) ×2 IMPLANT
BNDG ESMARK 4X9 LF (GAUZE/BANDAGES/DRESSINGS) IMPLANT
BNDG ESMARK 6X9 LF (GAUZE/BANDAGES/DRESSINGS)
BNDG GAUZE ELAST 4 BULKY (GAUZE/BANDAGES/DRESSINGS) ×2 IMPLANT
CONT SPEC 4OZ CLIKSEAL STRL BL (MISCELLANEOUS) IMPLANT
COVER SURGICAL LIGHT HANDLE (MISCELLANEOUS) ×2 IMPLANT
CUFF TOURN SGL LL 12 NO SLV (MISCELLANEOUS) IMPLANT
CUFF TOURNIQUET SINGLE 34IN LL (TOURNIQUET CUFF) IMPLANT
DRAPE SURG 17X23 STRL (DRAPES) IMPLANT
DRAPE U-SHAPE 47X51 STRL (DRAPES) IMPLANT
DRSG ADAPTIC 3X8 NADH LF (GAUZE/BANDAGES/DRESSINGS) ×2 IMPLANT
DRSG PAD ABDOMINAL 8X10 ST (GAUZE/BANDAGES/DRESSINGS) ×2 IMPLANT
DURAPREP 26ML APPLICATOR (WOUND CARE) ×2 IMPLANT
ELECT REM PT RETURN 9FT ADLT (ELECTROSURGICAL)
ELECTRODE REM PT RTRN 9FT ADLT (ELECTROSURGICAL) IMPLANT
EVACUATOR 1/8 PVC DRAIN (DRAIN) IMPLANT
FACESHIELD WRAPAROUND (MASK) IMPLANT
GAUZE SPONGE 4X4 12PLY STRL (GAUZE/BANDAGES/DRESSINGS) ×2 IMPLANT
GAUZE SPONGE 4X4 12PLY STRL LF (GAUZE/BANDAGES/DRESSINGS) ×2 IMPLANT
GAUZE XEROFORM 1X8 LF (GAUZE/BANDAGES/DRESSINGS) ×2 IMPLANT
GLOVE BIO SURGEON STRL SZ7.5 (GLOVE) ×4 IMPLANT
GLOVE BIOGEL PI IND STRL 8 (GLOVE) ×2 IMPLANT
GLOVE BIOGEL PI INDICATOR 8 (GLOVE) ×2
GLOVE SKINSENSE NS SZ7.0 (GLOVE) ×3
GLOVE SKINSENSE STRL SZ7.0 (GLOVE) ×3 IMPLANT
GOWN STRL REUS W/ TWL LRG LVL3 (GOWN DISPOSABLE) ×3 IMPLANT
GOWN STRL REUS W/ TWL XL LVL3 (GOWN DISPOSABLE) ×2 IMPLANT
GOWN STRL REUS W/TWL LRG LVL3 (GOWN DISPOSABLE) ×6
GOWN STRL REUS W/TWL XL LVL3 (GOWN DISPOSABLE) ×4
HANDPIECE INTERPULSE COAX TIP (DISPOSABLE)
KIT BASIN OR (CUSTOM PROCEDURE TRAY) ×2 IMPLANT
KIT ROOM TURNOVER OR (KITS) ×2 IMPLANT
MANIFOLD NEPTUNE II (INSTRUMENTS) ×2 IMPLANT
NEEDLE 25GAX1.5 (MISCELLANEOUS) IMPLANT
NS IRRIG 1000ML POUR BTL (IV SOLUTION) ×2 IMPLANT
PACK ORTHO EXTREMITY (CUSTOM PROCEDURE TRAY) ×2 IMPLANT
PAD ABD 8X10 STRL (GAUZE/BANDAGES/DRESSINGS) ×2 IMPLANT
PAD ARMBOARD 7.5X6 YLW CONV (MISCELLANEOUS) ×4 IMPLANT
PAD CAST 4YDX4 CTTN HI CHSV (CAST SUPPLIES) ×1 IMPLANT
PADDING CAST COTTON 4X4 STRL (CAST SUPPLIES) ×2
SET HNDPC FAN SPRY TIP SCT (DISPOSABLE) IMPLANT
SPONGE LAP 18X18 X RAY DECT (DISPOSABLE) IMPLANT
STOCKINETTE IMPERVIOUS 9X36 MD (GAUZE/BANDAGES/DRESSINGS) ×2 IMPLANT
SUT ETHILON 3 0 PS 1 (SUTURE) ×6 IMPLANT
SUT PDS AB 2-0 CT1 27 (SUTURE) IMPLANT
SWAB CULTURE ESWAB REG 1ML (MISCELLANEOUS) IMPLANT
SYR CONTROL 10ML LL (SYRINGE) IMPLANT
TOWEL OR 17X24 6PK STRL BLUE (TOWEL DISPOSABLE) ×2 IMPLANT
TOWEL OR 17X26 10 PK STRL BLUE (TOWEL DISPOSABLE) ×2 IMPLANT
TUBE CONNECTING 12X1/4 (SUCTIONS) ×2 IMPLANT
TUBING CYSTO DISP (UROLOGICAL SUPPLIES) ×2 IMPLANT
UNDERPAD 30X30 (UNDERPADS AND DIAPERS) ×2 IMPLANT
YANKAUER SUCT BULB TIP NO VENT (SUCTIONS) ×2 IMPLANT

## 2017-06-14 NOTE — Care Management Important Message (Signed)
Important Message  Patient Details  Name: [name] MRN: [id number] Date of Birth: 1971-04-12   Medicare Important Message Given:  Yes    Nathen May 06/14/2017, 10:41 AM

## 2017-06-14 NOTE — Op Note (Signed)
06/08/2017 - 06/14/2017  11:35 AM  PATIENT:  [name]    PRE-OPERATIVE DIAGNOSIS:  INFECTED LEFT LEG  POST-OPERATIVE DIAGNOSIS:  Same  PROCEDURE:  IRRIGATION AND DEBRIDEMENT EXTREMITY/WOUND CLOSURE  SURGEON:  MURPHY, Ernesta Amble, MD  ASSISTANT: Roxan Hockey, PA-C, he was present and scrubbed throughout the case, critical for completion in a timely fashion, and for retraction, instrumentation, and closure.   ANESTHESIA:   gen  PREOPERATIVE INDICATIONS:  [name] is a  46 y.o. female with a diagnosis of INFECTED LEFT LEG who failed conservative measures and elected for surgical management.    The risks benefits and alternatives were discussed with the patient preoperatively including but not limited to the risks of infection, bleeding, nerve injury, cardiopulmonary complications, the need for revision surgery, among others, and the patient was willing to proceed.  OPERATIVE IMPLANTS: none  OPERATIVE FINDINGS: no purulence  BLOOD LOSS: min  COMPLICATIONS: none  TOURNIQUET TIME: none  OPERATIVE PROCEDURE:  Patient was identified in the preoperative holding area and site was marked by me She was transported to the operating theater and placed on the table in supine position taking care to pad all bony prominences. After a preincinduction time out anesthesia was induced. The left lower extremity was prepped and draped in normal sterile fashion and a pre-incision timeout was performed. She received ancef for preoperative antibiotics.   I thoroughly examined her wound there is no remaining purulent fluid there was a small amount of necrotic tissue I performed an excisional debridement of this.  Identified her posterior tibial tendon and performed a T and a lysis here.  I thoroughly explored her posterior superficial and deep compartments and performed fasciotomies here there is no purulence.  I then performed a thorough irrigation with 3 L of saline.  I then  performed a complex closure of her 10 cm wound.  I placed a sterile dressing she was awoken and taken to the PACU in stable condition  POST OPERATIVE PLAN: Nonweightbearing for 1 week in a boot and weightbearing as tolerated mobilize and chemical DVT prophylaxis

## 2017-06-14 NOTE — Anesthesia Preprocedure Evaluation (Addendum)
Anesthesia Evaluation  Patient identified by MRN, date of birth, ID band Patient awake    Reviewed: Allergy & Precautions, H&P , NPO status , Patient's Chart, lab work & pertinent test results  Airway Mallampati: II  TM Distance: >3 FB Neck ROM: Full    Dental no notable dental hx. (+) Teeth Intact, Dental Advisory Given   Pulmonary asthma , sleep apnea , Current Smoker,    Pulmonary exam normal breath sounds clear to auscultation       Cardiovascular hypertension, Pt. on medications  Rhythm:Regular Rate:Normal     Neuro/Psych  Headaches, Anxiety Depression    GI/Hepatic Neg liver ROS, GERD  Controlled,  Endo/Other  negative endocrine ROS  Renal/GU ESRF and DialysisRenal disease  negative genitourinary   Musculoskeletal  (+) Arthritis , Osteoarthritis,    Abdominal   Peds  Hematology negative hematology ROS (+) anemia ,   Anesthesia Other Findings   Reproductive/Obstetrics negative OB ROS                           Anesthesia Physical Anesthesia Plan  ASA: III  Anesthesia Plan: General   Post-op Pain Management:    Induction: Intravenous  PONV Risk Score and Plan: 3 and Ondansetron, Dexamethasone and Midazolam  Airway Management Planned: LMA  Additional Equipment:   Intra-op Plan:   Post-operative Plan: Extubation in OR  Informed Consent: I have reviewed the patients History and Physical, chart, labs and discussed the procedure including the risks, benefits and alternatives for the proposed anesthesia with the patient or authorized representative who has indicated his/her understanding and acceptance.   Dental advisory given  Plan Discussed with: CRNA, Anesthesiologist and Surgeon  Anesthesia Plan Comments:       Anesthesia Quick Evaluation

## 2017-06-14 NOTE — Transfer of Care (Signed)
Immediate Anesthesia Transfer of Care Note  Patient: [name]  Procedure(s) Performed: Procedure(s): IRRIGATION AND DEBRIDEMENT EXTREMITY/WOUND CLOSURE (Left)  Patient Location: PACU  Anesthesia Type:General  Level of Consciousness: awake, alert , oriented and patient cooperative  Airway & Oxygen Therapy: Patient Spontanous Breathing and Patient connected to nasal cannula oxygen  Post-op Assessment: Report given to RN, Post -op Vital signs reviewed and stable and Patient moving all extremities X 4  Post vital signs: Reviewed and stable  Last Vitals:  Vitals:   06/14/17 0701 06/14/17 1159  BP: 140/78   Pulse: 85   Resp: 17   Temp: 36.7 C 36.6 C  SpO2: 100%     Last Pain:  Vitals:   06/14/17 1159  TempSrc:   PainSc: 10-Worst pain ever      Patients Stated Pain Goal: 3 (20/25/42 7062)  Complications: No apparent anesthesia complications

## 2017-06-14 NOTE — Progress Notes (Signed)
Family Medicine Teaching Service Daily Progress Note Intern Pager: [phone]  Patient name: [name] Medical record number: [id number] Date of birth: April 18, 1971 Age: 46 y.o. Gender: female  Primary Care Provider: Greig Right, MD Consultants: ortho, nephro, ID Code Status: full  Pt Overview and Major Events to Date:  [name] a 46 y.o.femalepresenting with left ankle pain and swelling. PMH is significant for ESRD on HD, HTN, bronchitis, anxiety/depression, chronic low back pain. Initial MRI showed potential tear to tibialis tendon/tendosynovitis and fluid surrounding joint but no marrow process.   Followup CT showed abscess within tendon and surgical drainage was performed, now with wound vac (wound cultures + staphA).  Assessment and Plan: [name] a 46 y.o.femalepresenting with left ankle pain and swelling. PMH is significant for ESRD on HD, HTN, bronchitis, anxiety/depression, chronic low back pain.  Sepsis with left ankle pain:  Although MRI was negative, followup CT showed abscess had developed.  Now s/p surgical drainage w/ wound vac -surgery to reopen for ID today, per patient they might close wound after -NPO pending procedure -linezolid per ID (8/11-) - wound culture + staphA - wean  Dilaudid to 2mg  q2hrs prn oral, home dose oxycontinand Tylenol for pain  ESRD on HD: Nephrologist is Dr. Justin Mend. MWF in [place].  -last HD 8/13 -Continue home Phoslo 2,001mg  bid with meals, Renvela 2,400mg  bid with meals, and Rena-vit daily -nephro following, appreciate input   HTN: BPs elevated 150-170/80-90.  -Hold home Hydralazine 50mg  tid and Lisinopril 40mg  daily -Norvasc 10mg  daily -increase metoprolol to 25 mg BID  -per nephro, no need to restart lasix - Hydralazine IV prn for SBP > 160  Anemia in CKD: Hgb 8.5 today. Baseline 9-11. Transfused 1unit during HD 8/10 -follow CBC   ?Bronchitis: Unclear if she has had PFTs in the  past, but she smokes 1/2 ppd. Per patient, she does not have a diagnosis of COPD.  -continue Albuterol 2 puffs q6hrs prn, and Singulair 10mg  daily  Anxiety: Takes Xanax tid prn at home.  - Continue home Xanax  Chronic Low Back Pain: Takes Dilaudid 2mg  bid prn and Oxycodone 40mg  bid at home. Follows with a pain clinic in [place]. -Dilaudid 2 mg q2h prn  -Robaxin 500mg  bid  -Flexeril d/c'd due to linezolid  FEN/GI: Renal diet with fluid restriction. Prophylaxis: Heparin sq  Disposition:Discharge w/ home PT home pending clinical improvement, patient wants to leave as daughter is having a baby  Subjective:  Patient feels she is doing well but wound vac is clogged and she is going into surgery again so she is concerned about that.   She also wants to leave today if possible after the procedure since she has a daughter giving birth.     Objective: Temp:  [98 F (36.7 C)-99.3 F (37.4 C)] 99.2 F (37.3 C) (08/14 0156) Pulse Rate:  [78-87] 81 (08/14 0156) Resp:  [13-18] 17 (08/14 0156) BP: (115-148)/(50-83) 121/71 (08/14 0156) SpO2:  [95 %-100 %] 95 % (08/14 0156) Weight:  [138 lb 14.2 oz (63 kg)-142 lb 3.2 oz (64.5 kg)] 138 lb 14.2 oz (63 kg) (08/13 1045) Physical Exam: General: alert and able to Barnett freely, no obvious pain, much more comfortable than past days Cardiovascular: RRR, still referred sounds from fistula in L arm, distal pulses intact but left DP is decreased Respiratory: lung sounds CTA with slight wheezing and no IWB Abdomen: soft to palpation with no rebound tenderness and bowel sounds in all 4 quadrants Extremities: Left leg with  wound vac on interior lower calf, warm but not hot to the touch, drainage uniformly red, distal pulse palpable and symetrical, able to move toes, wound vac is currently clogged but patient will be in surgery in 3 hours  Laboratory:  Recent Labs Lab 06/12/17 0451 06/13/17 0549 06/14/17 0337  WBC 13.8* 12.4* 9.9  HGB 9.1*  8.5* 8.5*  HCT 29.0* 27.2* 27.2*  PLT 291 263 266    Recent Labs Lab 06/08/17 0233  06/12/17 0451 06/13/17 0549 06/14/17 0337  NA 140  < > 140 139 137  K 3.9  < > 4.2 4.3 3.7  CL 94*  < > 99* 100* 95*  CO2 29  < > 29 27 33*  BUN 35*  < > 35* 45* 24*  CREATININE 6.96*  < > 6.34* 7.81* 4.84*  CALCIUM 9.8  < > 10.3 9.4 9.7  PROT 6.0*  --   --   --   --   BILITOT 0.8  --   --   --   --   ALKPHOS 94  --   --   --   --   ALT 11*  --   --   --   --   AST 13*  --   --   --   --   GLUCOSE 154*  < > 99 98 108*  < > = values in this interval not displayed.    Imaging/Diagnostic Tests: Mr Ankle Left Wo Contrast  Result Date: 06/08/2017 CLINICAL DATA:  Ankle pain of uncertain etiology. EXAM: MRI OF THE LEFT ANKLE WITHOUT CONTRAST TECHNIQUE: Multiplanar, multisequence MR imaging of the ankle was performed. No intravenous contrast was administered. COMPARISON:  None. FINDINGS: Exam is limited by patient motion artifacts. TENDONS Peroneal: Intact peroneus longus and peroneus brevis tendons. Posteromedial: A moderate to marked amount of fluid outlines the tibialis posterior tendon with intrasubstance T2 signal noted within the tendon and muscle leading up to the tendon suspicious for tenosynovitis, muscle strain and longitudinal split tear/tendinosis of the tendon (series 6, image 12, series 10 image 12, series 9 image 1 through 12 for example). The flexor digitorum longus and flexor hallucis longus tendons are intact. Anterior: Intact tibialis anterior, extensor hallucis longus and extensor digitorum longus tendons. Achilles: Intact. Plantar Fascia: Intact. LIGAMENTS Lateral: Grossly intact Medial: Grossly intact CARTILAGE Ankle Joint: No joint effusion or chondral defect. Subtalar Joints/Sinus Tarsi: No joint effusion or chondral defect. Bones: No marrow signal abnormality. No fracture or dislocation. Other: Diffuse soft tissue edema of the included ankle and foot. IMPRESSION: 1. Moderate-to-marked  amount of fluid outlining the tibialis posterior tendon with intrasubstance elevated T2 signal suspicious for intrasubstance tear and tenosynovitis. The muscle is also edematous suspicious for a strain or potentially myositis. 2. Diffuse subcutaneous edema about the included ankle and foot consistent with third spacing of fluid or cellulitis. 3. No acute marrow signal abnormalities noted. Electronically Signed   By: Ashley Royalty M.D.   On: 06/08/2017 18:07     Sherene Sires, DO 06/14/2017, 6:53 AM PGY-1, [place] Intern pager: [phone], text pages welcome

## 2017-06-14 NOTE — Progress Notes (Signed)
S: Still with pain in Lt foot though sl better than yest O:BP 140/78 (BP Location: Right Arm)   Pulse 85   Temp 98.1 F (36.7 C) (Oral)   Resp 17   Ht 5\' 4"  (1.626 m)   Wt 63 kg (138 lb 14.2 oz)   SpO2 100%   BMI 23.84 kg/m   Intake/Output Summary (Last 24 hours) at 06/14/17 0751 Last data filed at 06/13/17 1045  Gross per 24 hour  Intake                0 ml  Output             1000 ml  Net            -1000 ml   Weight change:  PFX:TKWIO and alert CVS: RRR Resp: Clear Abd: +BS NTND Ext: Vac Lt lower leg, erythematous skin  LUA AVF + bruit NEURO:CNI Ox3 No asterixis   . ALPRAZolam  1 mg Oral TID  . amLODipine  10 mg Oral QHS  . buPROPion  150 mg Oral Daily  . calcium acetate  2,001 mg Oral BID WC  . chlorhexidine  60 mL Topical Once  . darbepoetin (ARANESP) injection - DIALYSIS  200 mcg Intravenous Q Fri-HD  . docusate sodium  100 mg Oral BID  . DULoxetine  60 mg Oral Daily  . feeding supplement (PRO-STAT SUGAR FREE 64)  30 mL Oral TID BM  . linezolid  600 mg Oral Q12H  . methocarbamol  500 mg Oral BID  . metoprolol tartrate  25 mg Oral BID  . montelukast  10 mg Oral QHS  . multivitamin  1 tablet Oral QHS  . oxyCODONE  40 mg Oral Q12H  . senna  1 tablet Oral BID  . sevelamer carbonate  2,400 mg Oral BID WC  . sodium chloride flush  3 mL Intravenous Q12H   No results found. BMET    Component Value Date/Time   NA 137 06/14/2017 0337   K 3.7 06/14/2017 0337   CL 95 (L) 06/14/2017 0337   CO2 33 (H) 06/14/2017 0337   GLUCOSE 108 (H) 06/14/2017 0337   BUN 24 (H) 06/14/2017 0337   CREATININE 4.84 (H) 06/14/2017 0337   CALCIUM 9.7 06/14/2017 0337   CALCIUM 9.3 01/03/2012 1109   GFRNONAA 10 (L) 06/14/2017 0337   GFRAA 12 (L) 06/14/2017 0337   CBC    Component Value Date/Time   WBC 9.9 06/14/2017 0337   RBC 2.87 (L) 06/14/2017 0337   HGB 8.5 (L) 06/14/2017 0337   HCT 27.2 (L) 06/14/2017 0337   PLT 266 06/14/2017 0337   MCV 94.8 06/14/2017 0337   MCH 29.6  06/14/2017 0337   MCHC 31.3 06/14/2017 0337   RDW 15.6 (H) 06/14/2017 0337   LYMPHSABS 1.8 06/10/2017 0404   MONOABS 0.7 06/10/2017 0404   EOSABS 0.0 06/10/2017 0404   BASOSABS 0.0 06/10/2017 0404     Assessment:  1. Lt leg/ankle abscess  Gm pos cocci on gram stain and culture on linezolid and cefazolin 2. HTN.   3. Anemia on aranesp 4. ESRD  MWF [place].  No heparin in outpt unit as she bleeds  Plan: 1. Plans for more debridement today per pt 2. Plan HD in AM  Mayra Brahm T

## 2017-06-14 NOTE — Anesthesia Postprocedure Evaluation (Signed)
Anesthesia Post Note  Patient: [name]  Procedure(s) Performed: Procedure(s) (LRB): IRRIGATION AND DEBRIDEMENT EXTREMITY/WOUND CLOSURE (Left)     Patient location during evaluation: PACU Anesthesia Type: General Level of consciousness: awake and alert Pain management: pain level controlled Vital Signs Assessment: post-procedure vital signs reviewed and stable Respiratory status: spontaneous breathing, nonlabored ventilation and respiratory function stable Cardiovascular status: blood pressure returned to baseline and stable Postop Assessment: no signs of nausea or vomiting Anesthetic complications: no    Last Vitals:  Vitals:   06/14/17 1159 06/14/17 1212  BP:  (!) 157/85  Pulse:  73  Resp:  14  Temp: 36.6 C   SpO2:  100%    Last Pain:  Vitals:   06/14/17 1225  TempSrc:   PainSc: 8                  Brysun Eschmann,W. EDMOND

## 2017-06-14 NOTE — Progress Notes (Signed)
PT Cancellation Note  Patient Details Name: Sarah WOOLLARD MRN: [id number] DOB: 30-Mar-1971   Cancelled Treatment:    Reason Eval/Treat Not Completed: Patient at procedure or test/unavailable.  Pt back in surgery for I and D and VAC change.  Will see as able. 06/14/2017  Donnella Sham, [place] [phone]  (pager)   Tessie Fass Briceida Rasberry 06/14/2017, 10:12 AM

## 2017-06-14 NOTE — Anesthesia Procedure Notes (Signed)
Procedure Name: LMA Insertion Date/Time: 06/14/2017 11:00 AM Performed by: Carney Living Pre-anesthesia Checklist: Patient identified, Emergency Drugs available, Suction available, Patient being monitored and Timeout performed Patient Re-evaluated:Patient Re-evaluated prior to induction Oxygen Delivery Method: Circle system utilized Preoxygenation: Pre-oxygenation with 100% oxygen Induction Type: IV induction Ventilation: Mask ventilation without difficulty LMA: LMA inserted LMA Size: 4.0 Number of attempts: 1 Placement Confirmation: positive ETCO2 and breath sounds checked- equal and bilateral Tube secured with: Tape Dental Injury: Teeth and Oropharynx as per pre-operative assessment

## 2017-06-14 NOTE — Interval H&P Note (Signed)
History and Physical Interval Note:  06/14/2017 7:38 AM  [name]  has presented today for surgery, with the diagnosis of INFECTED LEFT LEG  The various methods of treatment have been discussed with the patient and family. After consideration of risks, benefits and other options for treatment, the patient has consented to  Procedure(s): IRRIGATION AND DEBRIDEMENT EXTREMITY/WOUND CLOSURE (Left) APPLICATION OF WOUND VAC (Left) as a surgical intervention .  The patient's history has been reviewed, patient examined, no change in status, stable for surgery.  I have reviewed the patient's chart and labs.  Questions were answered to the patient's satisfaction.     Deztinee Lohmeyer D

## 2017-06-14 NOTE — Progress Notes (Signed)
Orthopedic Tech Progress Note Patient Details:  [name] 1971-09-27 [id number]  Ortho Devices Type of Ortho Device: CAM walker Ortho Device/Splint Location: lle Ortho Device/Splint Interventions: Application   [name] 06/14/2017, 2:48 PM

## 2017-06-14 NOTE — Progress Notes (Signed)
Patient discharge teaching given, including activity, diet, follow-up appoints, and medications. Patient verbalized understanding of all discharge instructions. IV access was d/c'd. Vitals are stable. Skin is intact except as charted in most recent assessments. Pt to be escorted out by NT, to be driven home by family. 

## 2017-06-14 NOTE — Discharge Instructions (Signed)
Maintain dressings - clean and dry until follow up with Dr. Alain Marion. Keep leg elevated at all times to reduce pain and swelling. Do not smoke.  Smoking will increase chances of continued infection and decrease your ability to heal your wound.

## 2017-06-14 NOTE — Progress Notes (Signed)
Physical Therapy Treatment Patient Details Name: [name] MRN: [id number] DOB: 06/22/1971 Today's Date: 06/14/2017    History of Present Illness Pt is a 46 y.o. female with PMH significant for ESRD, hypertension, gout, GERD, asthma, anemia, and chronic pain who presented to the ED with swelling of L ankle x2 weeks. CT revealed large fluid collection surrounding abnormal posterior tibialis tendon. Pt additionally with 2 week history of abdominal pain and vomiting. She is now s/p irrigation and debridement L foot 06/12/17.     PT Comments    Pt much more able to mobilize NWB using the rollator as a w/c and transfering only.  Still could use HHPT home safety and treat.    Follow Up Recommendations  Home health PT     Equipment Recommendations       Recommendations for Other Services       Precautions / Restrictions Precautions Precautions: Fall    Mobility  Bed Mobility               General bed mobility comments: OOB throughout  Transfers Overall transfer level: Needs assistance Equipment used: 4-wheeled walker Transfers: Sit to/from Stand;Stand Pivot Transfers Sit to Stand: Supervision Stand pivot transfers: Supervision       General transfer comment: Moves well with NWB on L LE  cues for safer technique  Ambulation/Gait             General Gait Details: maintained transfer level at this point   Stairs            Wheelchair Mobility    Modified Rankin (Stroke Patients Only)       Balance Overall balance assessment: Needs assistance   Sitting balance-Leahy Scale: Good       Standing balance-Leahy Scale: Fair                              Cognition Arousal/Alertness: Awake/alert Behavior During Therapy: WFL for tasks assessed/performed Overall Cognitive Status: Within Functional Limits for tasks assessed                                        Exercises      General Comments General  comments (skin integrity, edema, etc.): Left word for MD intern, but did not hear back about dressing changes or showering etc.      Pertinent Vitals/Pain Pain Assessment: Faces Faces Pain Scale: Hurts even more Pain Location: L ankle Pain Descriptors / Indicators: Grimacing;Guarding Pain Intervention(s): Monitored during session    Home Living                      Prior Function            PT Goals (current goals can now be found in the care plan section) Acute Rehab PT Goals Patient Stated Goal: less pain PT Goal Formulation: With patient Time For Goal Achievement: 06/20/17 Potential to Achieve Goals: Good Progress towards PT goals: Progressing toward goals    Frequency    Min 4X/week      PT Plan Current plan remains appropriate    Co-evaluation              AM-PAC PT "6 Clicks" Daily Activity  Outcome Measure  Difficulty turning over in bed (including adjusting bedclothes, sheets and blankets)?: A Little Difficulty moving from lying  on back to sitting on the side of the bed? : A Little Difficulty sitting down on and standing up from a chair with arms (e.g., wheelchair, bedside commode, etc,.)?: A Little Help needed moving to and from a bed to chair (including a wheelchair)?: A Little Help needed walking in hospital room?: A Little Help needed climbing 3-5 steps with a railing? : Total 6 Click Score: 16    End of Session   Activity Tolerance: Patient tolerated treatment well;Patient limited by pain Patient left: in bed;with call bell/phone within reach;with family/visitor present Nurse Communication: Mobility status PT Visit Diagnosis: Other abnormalities of gait and mobility (R26.89);Pain Pain - Right/Left: Left Pain - part of body: Ankle and joints of foot     Time: 1725-1740 PT Time Calculation (min) (ACUTE ONLY): 15 min  Charges:  $Therapeutic Activity: 8-22 mins                    G Codes:       2017-06-29  [name],  PT [phone]  (pager)   [name] 2017-06-29, 5:41 PM

## 2017-06-14 NOTE — H&P (View-Only) (Signed)
Patient still miserable with pain, worsening cellulitis, fluid collection in the posterior tibialis tendon sheath that is fairly large, worrisome for pyomyositis as seen on repeat CT. Recommend urgent surgical incision, irrigation, and debridement. Discussed the potential need for multiple operations. Also likely plan for irrigation all wound VAC.   The risks, benefits, and alternatives have been discussed at length, and the patient is willing to proceed.    Johnny Bridge, MD

## 2017-06-14 NOTE — Progress Notes (Signed)
Spoke with Dr. Percell Miller regarding his recommendations for [name]'s post-operative care.  He said that she can be discharged today as long as she wears the CAM walker all the time.  In addition, she needs to take Doxycycline for 4 weeks since her infection reached bone.  Furthermore, he will not be prescribing her any pain medications - she will need to follow-up with her pain clinic for this after she finishes her course of pain medications from discharge.

## 2017-06-15 ENCOUNTER — Encounter (HOSPITAL_COMMUNITY): Payer: Self-pay | Admitting: Orthopedic Surgery

## 2017-06-16 DIAGNOSIS — N2581 Secondary hyperparathyroidism of renal origin: Secondary | ICD-10-CM | POA: Diagnosis not present

## 2017-06-16 DIAGNOSIS — M65062 Abscess of tendon sheath, left lower leg: Secondary | ICD-10-CM | POA: Diagnosis not present

## 2017-06-16 DIAGNOSIS — N186 End stage renal disease: Secondary | ICD-10-CM | POA: Diagnosis not present

## 2017-06-16 DIAGNOSIS — D509 Iron deficiency anemia, unspecified: Secondary | ICD-10-CM | POA: Diagnosis not present

## 2017-06-17 DIAGNOSIS — D509 Iron deficiency anemia, unspecified: Secondary | ICD-10-CM | POA: Diagnosis not present

## 2017-06-17 DIAGNOSIS — N186 End stage renal disease: Secondary | ICD-10-CM | POA: Diagnosis not present

## 2017-06-17 DIAGNOSIS — M65062 Abscess of tendon sheath, left lower leg: Secondary | ICD-10-CM | POA: Diagnosis not present

## 2017-06-17 DIAGNOSIS — N2581 Secondary hyperparathyroidism of renal origin: Secondary | ICD-10-CM | POA: Diagnosis not present

## 2017-06-17 LAB — AEROBIC/ANAEROBIC CULTURE (SURGICAL/DEEP WOUND)

## 2017-06-17 LAB — AEROBIC/ANAEROBIC CULTURE W GRAM STAIN (SURGICAL/DEEP WOUND)

## 2017-06-19 NOTE — Discharge Summary (Signed)
[facility] Discharge Summary  Patient name: [name] Medical record number: [id number] Date of birth: 09-Dec-1970 Age: 46 y.o. Gender: female Date of Admission: 06/08/2017  Date of Discharge: 8/19 Admitting Physician: Leeanne Rio, MD  Primary Care Provider: Greig Right, MD Consultants: ID, ortho  Indication for Hospitalization: cellulitis/infection of left ankle  Discharge Diagnoses/Problem List:  Tenosynovitis ESRD on HD Chronic pain   Disposition: home  Discharge Condition: stable  Discharge Exam: General: alert and able to converse freely, no obvious pain, much more comfortable than past days Cardiovascular: RRR, still referred sounds from fistula in L arm, distal pulses intact but left DP is decreased Respiratory: lung sounds CTA with slightwheezing and no IWB Abdomen: soft to palpation with no rebound tenderness and bowel sounds in all 4 quadrants Extremities: Left leg with wound vac on interior lower calf, warm but not hot to the touch, drainage uniformly red, distal pulse palpable and symetrical, able to move toes, wound vac is currently clogged but patient will be in surgery in 3 hours  Brief Hospital Course:  Patient was admitted for significant pain/erythema/swelling in left ankle and calf after a fall 2 weeks ago.  Initial imaging showed tenosynovitis but no definitive abscess accumulation or osteolmyelitis so she was not deemed a surgical candidate and was managed with abx under consultation by ID and started on ancef.  As of 8/10 she had showed no improvement and an MRI was ordered.   It showed large abscess and she was transitioned to linezolid and scheduled for I/D by ortho.  She was on would vac for a day after drainage of a 5x9cm abscess in the posterior calf.  She was taken back to surgery for further evaluation for final debridement and wound closing on 8/14 and deemed safe for discharge at her request.  Outpatient  abx coverage was transitioned to doxycycline.  She was administered HD per nephrology during hospitalization and they administered transfusion for sympomatic anemia with no complication.  Issues for Follow Up:  1. Initial week of breakthrough pain medication prescribed by hospital team, but pain management will need to handle pain medication moving forward, per ortho 2. Due to proximity of infection to bone, she is being prescribed doxycyline until 9/13, please ensure she takes this medication and please monitor for signs of osteomyelitis. 3. Please ensure she is wearing the CAM walker as prescribed  Significant Procedures: I/D of infectious tenosynovitis, transfusion during HD  Significant Labs and Imaging:   Recent Labs Lab 06/12/17 0451 06/13/17 0549 06/14/17 0337  WBC 13.8* 12.4* 9.9  HGB 9.1* 8.5* 8.5*  HCT 29.0* 27.2* 27.2*  PLT 291 263 266    Recent Labs Lab 06/12/17 0451 06/13/17 0549 06/14/17 0337  NA 140 139 137  K 4.2 4.3 3.7  CL 99* 100* 95*  CO2 29 27 33*  GLUCOSE 99 98 108*  BUN 35* 45* 24*  CREATININE 6.34* 7.81* 4.84*  CALCIUM 10.3 9.4 9.7    Mr Ankle Left Wo Contrast  Result Date: 06/08/2017 CLINICAL DATA:  Ankle pain of uncertain etiology. EXAM: MRI OF THE LEFT ANKLE WITHOUT CONTRAST TECHNIQUE: Multiplanar, multisequence MR imaging of the ankle was performed. No intravenous contrast was administered. COMPARISON:  None. FINDINGS: Exam is limited by patient motion artifacts. TENDONS Peroneal: Intact peroneus longus and peroneus brevis tendons. Posteromedial: A moderate to marked amount of fluid outlines the tibialis posterior tendon with intrasubstance T2 signal noted within the tendon and muscle leading up to the tendon  suspicious for tenosynovitis, muscle strain and longitudinal split tear/tendinosis of the tendon (series 6, image 12, series 10 image 12, series 9 image 1 through 12 for example). The flexor digitorum longus and flexor hallucis longus tendons  are intact. Anterior: Intact tibialis anterior, extensor hallucis longus and extensor digitorum longus tendons. Achilles: Intact. Plantar Fascia: Intact. LIGAMENTS Lateral: Grossly intact Medial: Grossly intact CARTILAGE Ankle Joint: No joint effusion or chondral defect. Subtalar Joints/Sinus Tarsi: No joint effusion or chondral defect. Bones: No marrow signal abnormality. No fracture or dislocation. Other: Diffuse soft tissue edema of the included ankle and foot. IMPRESSION: 1. Moderate-to-marked amount of fluid outlining the tibialis posterior tendon with intrasubstance elevated T2 signal suspicious for intrasubstance tear and tenosynovitis. The muscle is also edematous suspicious for a strain or potentially myositis. 2. Diffuse subcutaneous edema about the included ankle and foot consistent with third spacing of fluid or cellulitis. 3. No acute marrow signal abnormalities noted. Electronically Signed   By: Ashley Royalty M.D.   On: 06/08/2017 18:07    Results/Tests Pending at Time of Discharge: N/A  Discharge Medications:  Allergies as of 06/14/2017      Reactions   Nsaids Other (See Comments)   Renal failure   Clindamycin/lincomycin Hives, Rash   Demerol Nausea And Vomiting, Other (See Comments)   Severe headaches   Fentanyl Hives, Nausea And Vomiting, Rash, Other (See Comments)   Reaction to patches only   Other Other (See Comments)   Seeds and nuts- has diverticulitis    Vancomycin Hives   ? 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   Clarithromycin Nausea And Vomiting   Morphine And Related Nausea And Vomiting, Other (See Comments)   Severe headache, Pt does not want it!      Medication List    STOP taking these medications   cephALEXin 500 MG capsule Commonly known as:  KEFLEX   furosemide 80 MG tablet Commonly known as:  LASIX   hydrALAZINE 50 MG tablet Commonly known as:  APRESOLINE   lisinopril 40 MG tablet Commonly known as:  PRINIVIL,ZESTRIL     TAKE these medications   acetaminophen 325  MG tablet Commonly known as:  TYLENOL Take 2 tablets (650 mg total) by mouth every 8 (eight) hours as needed for mild pain, fever or headache.   albuterol 108 (90 Base) MCG/ACT inhaler Commonly known as:  PROVENTIL HFA;VENTOLIN HFA Inhale 2 puffs into the lungs every 6 (six) hours as needed for shortness of breath.   ALPRAZolam 1 MG tablet Commonly known as:  XANAX Take 1 mg by mouth 3 (three) times daily.   amLODipine 10 MG tablet Commonly known as:  NORVASC Take 10 mg by mouth at bedtime.   b complex vitamins tablet Take 1 tablet by mouth daily.   budesonide-formoterol 160-4.5 MCG/ACT inhaler Commonly known as:  SYMBICORT Inhale 2 puffs into the lungs 2 (two) times daily.   buPROPion 150 MG 12 hr tablet Commonly known as:  WELLBUTRIN SR Take 150 mg by mouth daily.   calcium acetate 667 MG capsule Commonly known as:  PHOSLO Take 2,001 mg by mouth 2 (two) times daily with a meal.   cyclobenzaprine 10 MG tablet Commonly known as:  FLEXERIL Take 20 mg by mouth at bedtime.   doxycycline 100 MG tablet Commonly known as:  VIBRA-TABS Take 1 tablet (100 mg total) by mouth every 12 (twelve) hours.   DULoxetine 60 MG capsule Commonly known as:  CYMBALTA Take 60 mg by mouth daily.   HYDROmorphone 2 MG  tablet Commonly known as:  DILAUDID Take 1 tablet (2 mg total) by mouth every 4 (four) hours as needed for severe pain. What changed:  when to take this   lidocaine-prilocaine cream Commonly known as:  EMLA Apply 1 application topically See admin instructions. Apply topically one hour prior to dialysis - Monday, Wednesday, Friday   Melatonin 5 MG Tabs Take 5 mg by mouth at bedtime.   methocarbamol 500 MG tablet Commonly known as:  ROBAXIN Take 500 mg by mouth 2 (two) times daily.   metoprolol tartrate 25 MG tablet Commonly known as:  LOPRESSOR Take 1 tablet (25 mg total) by mouth 2 (two) times daily.   montelukast 10 MG tablet Commonly known as:  SINGULAIR Take 10  mg by mouth daily.   multivitamin Tabs tablet Take 1 tablet by mouth daily.   oxyCODONE 40 mg 12 hr tablet Commonly known as:  OXYCONTIN Take 40 mg by mouth every 12 (twelve) hours.   promethazine 25 MG tablet Commonly known as:  PHENERGAN Take 25 mg by mouth every 6 (six) hours as needed for nausea.   sevelamer carbonate 800 MG tablet Commonly known as:  RENVELA Take 2,400 mg by mouth 2 (two) times daily with a meal.   SUMAtriptan 50 MG tablet Commonly known as:  IMITREX Take 50 mg by mouth daily as needed for migraine or headache.       Discharge Instructions: Please refer to Patient Instructions section of EMR for full details.  Patient was counseled important signs and symptoms that should prompt return to medical care, changes in medications, dietary instructions, activity restrictions, and follow up appointments.   Follow-Up Appointments: Follow-up Information    Renette Butters, MD Follow up on 06/20/2017.   Specialty:  Orthopedic Surgery Contact information: [address] [place] [address] [phone]        Greig Right, MD. Schedule an appointment as soon as possible for a visit in 1 week(s).   Specialty:  Family Medicine Contact information: [place] [address] Goff, [place], DO 06/19/2017, 2:17 AM PGY-1, [place]

## 2017-06-20 DIAGNOSIS — N186 End stage renal disease: Secondary | ICD-10-CM | POA: Diagnosis not present

## 2017-06-20 DIAGNOSIS — M65062 Abscess of tendon sheath, left lower leg: Secondary | ICD-10-CM | POA: Diagnosis not present

## 2017-06-20 DIAGNOSIS — D509 Iron deficiency anemia, unspecified: Secondary | ICD-10-CM | POA: Diagnosis not present

## 2017-06-20 DIAGNOSIS — N2581 Secondary hyperparathyroidism of renal origin: Secondary | ICD-10-CM | POA: Diagnosis not present

## 2017-06-22 ENCOUNTER — Inpatient Hospital Stay (HOSPITAL_COMMUNITY)
Admission: AD | Admit: 2017-06-22 | Discharge: 2017-06-28 | DRG: 463 | Disposition: A | Discharge status: Home / Self Care | Payer: Medicare Other | Source: Ambulatory Visit | Attending: Family Medicine | Admitting: Family Medicine

## 2017-06-22 ENCOUNTER — Encounter (HOSPITAL_COMMUNITY): Payer: Self-pay | Admitting: *Deleted

## 2017-06-22 DIAGNOSIS — E669 Obesity, unspecified: Secondary | ICD-10-CM | POA: Diagnosis present

## 2017-06-22 DIAGNOSIS — N186 End stage renal disease: Secondary | ICD-10-CM | POA: Diagnosis present

## 2017-06-22 DIAGNOSIS — Z885 Allergy status to narcotic agent status: Secondary | ICD-10-CM

## 2017-06-22 DIAGNOSIS — M545 Low back pain: Secondary | ICD-10-CM | POA: Diagnosis present

## 2017-06-22 DIAGNOSIS — T814XXA Infection following a procedure, initial encounter: Secondary | ICD-10-CM | POA: Diagnosis not present

## 2017-06-22 DIAGNOSIS — J45909 Unspecified asthma, uncomplicated: Secondary | ICD-10-CM | POA: Diagnosis present

## 2017-06-22 DIAGNOSIS — L02416 Cutaneous abscess of left lower limb: Secondary | ICD-10-CM | POA: Diagnosis present

## 2017-06-22 DIAGNOSIS — D649 Anemia, unspecified: Secondary | ICD-10-CM | POA: Diagnosis not present

## 2017-06-22 DIAGNOSIS — S81802D Unspecified open wound, left lower leg, subsequent encounter: Secondary | ICD-10-CM | POA: Diagnosis not present

## 2017-06-22 DIAGNOSIS — G4733 Obstructive sleep apnea (adult) (pediatric): Secondary | ICD-10-CM | POA: Diagnosis present

## 2017-06-22 DIAGNOSIS — Z9889 Other specified postprocedural states: Secondary | ICD-10-CM

## 2017-06-22 DIAGNOSIS — Z9119 Patient's noncompliance with other medical treatment and regimen: Secondary | ICD-10-CM

## 2017-06-22 DIAGNOSIS — D631 Anemia in chronic kidney disease: Secondary | ICD-10-CM | POA: Diagnosis present

## 2017-06-22 DIAGNOSIS — Z79899 Other long term (current) drug therapy: Secondary | ICD-10-CM

## 2017-06-22 DIAGNOSIS — L03116 Cellulitis of left lower limb: Secondary | ICD-10-CM | POA: Diagnosis not present

## 2017-06-22 DIAGNOSIS — M659 Synovitis and tenosynovitis, unspecified: Principal | ICD-10-CM | POA: Diagnosis present

## 2017-06-22 DIAGNOSIS — S81802A Unspecified open wound, left lower leg, initial encounter: Secondary | ICD-10-CM | POA: Diagnosis not present

## 2017-06-22 DIAGNOSIS — N2581 Secondary hyperparathyroidism of renal origin: Secondary | ICD-10-CM | POA: Diagnosis present

## 2017-06-22 DIAGNOSIS — M1990 Unspecified osteoarthritis, unspecified site: Secondary | ICD-10-CM | POA: Diagnosis present

## 2017-06-22 DIAGNOSIS — I120 Hypertensive chronic kidney disease with stage 5 chronic kidney disease or end stage renal disease: Secondary | ICD-10-CM | POA: Diagnosis present

## 2017-06-22 DIAGNOSIS — Z888 Allergy status to other drugs, medicaments and biological substances status: Secondary | ICD-10-CM

## 2017-06-22 DIAGNOSIS — L0390 Cellulitis, unspecified: Secondary | ICD-10-CM | POA: Diagnosis present

## 2017-06-22 DIAGNOSIS — M65072 Abscess of tendon sheath, left ankle and foot: Secondary | ICD-10-CM | POA: Diagnosis present

## 2017-06-22 DIAGNOSIS — I10 Essential (primary) hypertension: Secondary | ICD-10-CM | POA: Diagnosis not present

## 2017-06-22 DIAGNOSIS — F17210 Nicotine dependence, cigarettes, uncomplicated: Secondary | ICD-10-CM | POA: Diagnosis present

## 2017-06-22 DIAGNOSIS — Z8249 Family history of ischemic heart disease and other diseases of the circulatory system: Secondary | ICD-10-CM

## 2017-06-22 DIAGNOSIS — K219 Gastro-esophageal reflux disease without esophagitis: Secondary | ICD-10-CM | POA: Diagnosis present

## 2017-06-22 DIAGNOSIS — Z992 Dependence on renal dialysis: Secondary | ICD-10-CM

## 2017-06-22 DIAGNOSIS — M109 Gout, unspecified: Secondary | ICD-10-CM | POA: Diagnosis present

## 2017-06-22 DIAGNOSIS — F419 Anxiety disorder, unspecified: Secondary | ICD-10-CM | POA: Diagnosis present

## 2017-06-22 DIAGNOSIS — G894 Chronic pain syndrome: Secondary | ICD-10-CM | POA: Diagnosis present

## 2017-06-22 DIAGNOSIS — R011 Cardiac murmur, unspecified: Secondary | ICD-10-CM | POA: Diagnosis not present

## 2017-06-23 DIAGNOSIS — M65072 Abscess of tendon sheath, left ankle and foot: Secondary | ICD-10-CM

## 2017-06-23 DIAGNOSIS — L03116 Cellulitis of left lower limb: Secondary | ICD-10-CM

## 2017-06-23 DIAGNOSIS — G894 Chronic pain syndrome: Secondary | ICD-10-CM

## 2017-06-23 DIAGNOSIS — L0390 Cellulitis, unspecified: Secondary | ICD-10-CM | POA: Diagnosis present

## 2017-06-23 LAB — COMPREHENSIVE METABOLIC PANEL
ALT: 5 U/L — ABNORMAL LOW (ref 14–54)
ANION GAP: 10 (ref 5–15)
AST: 12 U/L — AB (ref 15–41)
Albumin: 2.7 g/dL — ABNORMAL LOW (ref 3.5–5.0)
Alkaline Phosphatase: 77 U/L (ref 38–126)
BILIRUBIN TOTAL: 0.9 mg/dL (ref 0.3–1.2)
BUN: 39 mg/dL — AB (ref 6–20)
CHLORIDE: 99 mmol/L — AB (ref 101–111)
CO2: 30 mmol/L (ref 22–32)
Calcium: 8.9 mg/dL (ref 8.9–10.3)
Creatinine, Ser: 7.8 mg/dL — ABNORMAL HIGH (ref 0.44–1.00)
GFR calc Af Amer: 7 mL/min — ABNORMAL LOW (ref >60)
GFR calc non Af Amer: 6 mL/min — ABNORMAL LOW (ref >60)
GLUCOSE: 77 mg/dL (ref 65–99)
Potassium: 4 mmol/L (ref 3.5–5.1)
SODIUM: 139 mmol/L (ref 135–145)
TOTAL PROTEIN: 5.5 g/dL — AB (ref 6.5–8.1)

## 2017-06-23 LAB — RENAL FUNCTION PANEL
ANION GAP: 10 (ref 5–15)
Albumin: 2.7 g/dL — ABNORMAL LOW (ref 3.5–5.0)
BUN: 38 mg/dL — ABNORMAL HIGH (ref 6–20)
CALCIUM: 8.8 mg/dL — AB (ref 8.9–10.3)
CHLORIDE: 99 mmol/L — AB (ref 101–111)
CO2: 29 mmol/L (ref 22–32)
Creatinine, Ser: 7.64 mg/dL — ABNORMAL HIGH (ref 0.44–1.00)
GFR, EST AFRICAN AMERICAN: 7 mL/min — AB (ref >60)
GFR, EST NON AFRICAN AMERICAN: 6 mL/min — AB (ref >60)
Glucose, Bld: 77 mg/dL (ref 65–99)
Phosphorus: 6.4 mg/dL — ABNORMAL HIGH (ref 2.5–4.6)
Potassium: 3.9 mmol/L (ref 3.5–5.1)
Sodium: 138 mmol/L (ref 135–145)

## 2017-06-23 LAB — CBC
HCT: 24.6 % — ABNORMAL LOW (ref 36.0–46.0)
Hemoglobin: 7.5 g/dL — ABNORMAL LOW (ref 12.0–15.0)
MCH: 30.4 pg (ref 26.0–34.0)
MCHC: 30.5 g/dL (ref 30.0–36.0)
MCV: 99.6 fL (ref 78.0–100.0)
PLATELETS: 271 10*3/uL (ref 150–400)
RBC: 2.47 MIL/uL — ABNORMAL LOW (ref 3.87–5.11)
RDW: 17.9 % — AB (ref 11.5–15.5)
WBC: 5.9 10*3/uL (ref 4.0–10.5)

## 2017-06-23 LAB — PREPARE RBC (CROSSMATCH)

## 2017-06-23 MED ORDER — NICOTINE 14 MG/24HR TD PT24
14.0000 mg | MEDICATED_PATCH | Freq: Every day | TRANSDERMAL | Status: DC
  Administered 2017-06-23 – 2017-06-26 (×3): 14 mg via TRANSDERMAL
  Filled 2017-06-23 (×3): qty 1

## 2017-06-23 MED ORDER — OXYCODONE HCL ER 20 MG PO T12A: 40.0000 mg | EXTENDED_RELEASE_TABLET | Freq: Two times a day (BID) | ORAL | Status: DC

## 2017-06-23 MED ORDER — METOPROLOL TARTRATE 25 MG PO TABS
25.0000 mg | ORAL_TABLET | Freq: Two times a day (BID) | ORAL | Status: DC
  Administered 2017-06-24 – 2017-06-28 (×8): 25 mg via ORAL
  Filled 2017-06-23 (×8): qty 1

## 2017-06-23 MED ORDER — ACETAMINOPHEN 500 MG PO TABS
1000.0000 mg | ORAL_TABLET | Freq: Once | ORAL | Status: AC
  Administered 2017-06-23: 1000 mg via ORAL
  Filled 2017-06-23: qty 2

## 2017-06-23 MED ORDER — HYDROMORPHONE HCL 2 MG PO TABS: 2.0000 mg | ORAL_TABLET | Freq: Two times a day (BID) | ORAL | Status: DC | PRN

## 2017-06-23 MED ORDER — CHLORHEXIDINE GLUCONATE 4 % EX LIQD
60.0000 mL | Freq: Once | CUTANEOUS | Status: AC
  Administered 2017-06-23: 4 via TOPICAL
  Filled 2017-06-23: qty 60

## 2017-06-23 MED ORDER — LIDOCAINE-PRILOCAINE 2.5-2.5 % EX CREA: 1.0000 "application " | TOPICAL_CREAM | CUTANEOUS | Status: DC | PRN

## 2017-06-23 MED ORDER — VITAMIN C 250 MG PO TABS
250.0000 mg | ORAL_TABLET | Freq: Every day | ORAL | Status: DC
  Administered 2017-06-24 – 2017-06-28 (×4): 250 mg via ORAL
  Filled 2017-06-23 (×6): qty 1

## 2017-06-23 MED ORDER — DARBEPOETIN ALFA 200 MCG/0.4ML IJ SOSY
200.0000 ug | PREFILLED_SYRINGE | INTRAMUSCULAR | Status: DC
  Filled 2017-06-23: qty 0.4

## 2017-06-23 MED ORDER — CEFAZOLIN SODIUM-DEXTROSE 1-4 GM/50ML-% IV SOLN
1.0000 g | INTRAVENOUS | Status: DC
  Administered 2017-06-23 – 2017-06-26 (×3): 1 g via INTRAVENOUS
  Filled 2017-06-23 (×5): qty 50

## 2017-06-23 MED ORDER — HEPARIN SODIUM (PORCINE) 5000 UNIT/ML IJ SOLN
5000.0000 [IU] | Freq: Three times a day (TID) | INTRAMUSCULAR | Status: DC
  Administered 2017-06-23: 5000 [IU] via SUBCUTANEOUS
  Filled 2017-06-23 (×7): qty 1

## 2017-06-23 MED ORDER — RENA-VITE PO TABS
1.0000 | ORAL_TABLET | Freq: Every day | ORAL | Status: DC
  Administered 2017-06-23 – 2017-06-28 (×5): 1 via ORAL
  Filled 2017-06-23 (×6): qty 1

## 2017-06-23 MED ORDER — DARBEPOETIN ALFA 200 MCG/0.4ML IJ SOSY: 200.0000 ug | PREFILLED_SYRINGE | INTRAMUSCULAR | Status: DC

## 2017-06-23 MED ORDER — OXYCODONE HCL ER 20 MG PO T12A
40.0000 mg | EXTENDED_RELEASE_TABLET | Freq: Two times a day (BID) | ORAL | Status: DC
  Administered 2017-06-23 – 2017-06-28 (×12): 40 mg via ORAL
  Filled 2017-06-23 (×13): qty 2

## 2017-06-23 MED ORDER — DARBEPOETIN ALFA 200 MCG/0.4ML IJ SOSY
200.0000 ug | PREFILLED_SYRINGE | Freq: Once | INTRAMUSCULAR | Status: DC
  Filled 2017-06-23: qty 0.4

## 2017-06-23 MED ORDER — HYDROMORPHONE HCL 2 MG PO TABS
1.0000 mg | ORAL_TABLET | ORAL | Status: DC | PRN
  Administered 2017-06-23 – 2017-06-25 (×8): 1 mg via ORAL
  Filled 2017-06-23 (×8): qty 1

## 2017-06-23 MED ORDER — LACTATED RINGERS IV SOLN
INTRAVENOUS | Status: DC
  Administered 2017-06-23: via INTRAVENOUS

## 2017-06-23 MED ORDER — LIDOCAINE HCL (PF) 1 % IJ SOLN: 5.0000 mL | INTRAMUSCULAR | Status: DC | PRN

## 2017-06-23 MED ORDER — DOXYCYCLINE HYCLATE 100 MG PO TABS
100.0000 mg | ORAL_TABLET | Freq: Two times a day (BID) | ORAL | Status: DC
  Administered 2017-06-23: 100 mg via ORAL
  Filled 2017-06-23 (×2): qty 1

## 2017-06-23 MED ORDER — SEVELAMER CARBONATE 800 MG PO TABS
2400.0000 mg | ORAL_TABLET | Freq: Two times a day (BID) | ORAL | Status: DC
  Administered 2017-06-23: 2400 mg via ORAL
  Filled 2017-06-23: qty 3

## 2017-06-23 MED ORDER — MOMETASONE FURO-FORMOTEROL FUM 200-5 MCG/ACT IN AERO
2.0000 | INHALATION_SPRAY | Freq: Two times a day (BID) | RESPIRATORY_TRACT | Status: DC
  Administered 2017-06-23 – 2017-06-28 (×9): 2 via RESPIRATORY_TRACT
  Filled 2017-06-23 (×2): qty 8.8

## 2017-06-23 MED ORDER — PENTAFLUOROPROP-TETRAFLUOROETH EX AERO: 1.0000 "application " | INHALATION_SPRAY | CUTANEOUS | Status: DC | PRN

## 2017-06-23 MED ORDER — ALPRAZOLAM 0.5 MG PO TABS
1.0000 mg | ORAL_TABLET | Freq: Every evening | ORAL | Status: DC | PRN
  Administered 2017-06-23: 1 mg via ORAL
  Filled 2017-06-23: qty 2

## 2017-06-23 MED ORDER — ALPRAZOLAM 0.5 MG PO TABS
1.0000 mg | ORAL_TABLET | Freq: Three times a day (TID) | ORAL | Status: DC | PRN
  Administered 2017-06-24 – 2017-06-28 (×7): 1 mg via ORAL
  Filled 2017-06-23 (×7): qty 2

## 2017-06-23 MED ORDER — SODIUM CHLORIDE 0.9 % IV SOLN
Freq: Once | INTRAVENOUS | Status: AC
  Administered 2017-06-27: 11:00:00 via INTRAVENOUS

## 2017-06-23 MED ORDER — CEFAZOLIN SODIUM-DEXTROSE 2-4 GM/100ML-% IV SOLN
2.0000 g | INTRAVENOUS | Status: AC
  Administered 2017-06-24: 2 g via INTRAVENOUS
  Filled 2017-06-23: qty 100

## 2017-06-23 MED ORDER — SEVELAMER CARBONATE 800 MG PO TABS
800.0000 mg | ORAL_TABLET | Freq: Two times a day (BID) | ORAL | Status: DC
Start: 2017-06-23 — End: 2017-06-28
  Administered 2017-06-23 – 2017-06-28 (×8): 800 mg via ORAL
  Filled 2017-06-23 (×8): qty 1

## 2017-06-23 MED ORDER — SODIUM CHLORIDE 0.9 % IV SOLN: 100.0000 mL | INTRAVENOUS | Status: DC | PRN

## 2017-06-23 MED ORDER — LINEZOLID 600 MG/300ML IV SOLN
600.0000 mg | Freq: Two times a day (BID) | INTRAVENOUS | Status: DC
  Filled 2017-06-23: qty 300

## 2017-06-23 MED ORDER — CALCIUM ACETATE (PHOS BINDER) 667 MG PO CAPS
2001.0000 mg | ORAL_CAPSULE | Freq: Two times a day (BID) | ORAL | Status: DC
  Administered 2017-06-23 – 2017-06-28 (×9): 2001 mg via ORAL
  Filled 2017-06-23 (×9): qty 3

## 2017-06-23 NOTE — Progress Notes (Signed)
[facility] Progress Note    Background: [name] is 46 Y/O caucasian female with ESRD on hemodialysis MWF at [facility]. PMH: FSGS solitary kidney started HD 01/06/12,. medical non-compliance, obesity, OSA, H/O seizures, bronchitis, SHPT. AOCD. Patient is current smoker. Patient was admitted 06/08/17-06/19/17 for cellulitis L ankle requiring I & D by ortho and treatment with linezolid. She was sent home on Doxycycline which she never procured. She says she did not know she was supposed to have this medication. She was seen in orthopedic office yesterday and admitted to hospital for L ankle cellulitis, planning for repeat I & D tomorrow. She has been started on Ancef per primary.   Subjective: "I feel really tired and just in a lot of pain". Sitting up in bed-ace wrap LLE. Niece at bedside.    Objective Vitals:   06/22/17 2300 06/23/17 0053 06/23/17 0700  BP: 126/67 120/62 129/80  Pulse: 85 80 81  Resp: 15 14 14   Temp: 98.4 F (36.9 C) 98.2 F (36.8 C) 98.1 F (36.7 C)  TempSrc: Oral Oral Oral  SpO2: 97% 97% 97%   Physical Exam General: WN,WD appears older than stated age. NAD.  Heart: S1,S2,can hear bruit radiated from AVF but no M/G/R Lungs: CTAB A/P Abdomen: Active BS Extremities: No LE edema. LLE ace wrap.  Dialysis Access: LUA AVF aneurysmal, + bruit  Additional Objective Labs: Basic Metabolic Panel:  Recent Labs Lab 06/23/17 0431  NA 139  138  K 4.0  3.9  CL 99*  99*  CO2 30  29  GLUCOSE 77  77  BUN 39*  38*  CREATININE 7.80*  7.64*  CALCIUM 8.9  8.8*  PHOS 6.4*   Liver Function Tests:  Recent Labs Lab 06/23/17 0431  AST 12*  ALT 5*  ALKPHOS 77  BILITOT 0.9  PROT 5.5*  ALBUMIN 2.7*  2.7*   No results for input(s): LIPASE, AMYLASE in the last 168 hours. CBC:  Recent Labs Lab 06/23/17 0431  WBC 5.9  HGB 7.5*  HCT 24.6*  MCV 99.6  PLT 271   Blood Culture    Component Value Date/Time   SDES  ABSCESS 06/12/2017 1754   SPECREQUEST LEFT TIBIA DEEP 06/12/2017 1754   CULT FEW STAPHYLOCOCCUS AUREUS NO ANAEROBES ISOLATED  06/12/2017 1754   REPTSTATUS 06/17/2017 FINAL 06/12/2017 1754    Cardiac Enzymes: No results for input(s): CKTOTAL, CKMB, CKMBINDEX, TROPONINI in the last 168 hours. CBG: No results for input(s): GLUCAP in the last 168 hours. Iron Studies: No results for input(s): IRON, TIBC, TRANSFERRIN, FERRITIN in the last 72 hours. @lablastinr3 @ Studies/Results: No results found. Medications: .  ceFAZolin (ANCEF) IV     . calcium acetate  2,001 mg Oral BID WC  . darbepoetin (ARANESP) injection - DIALYSIS  200 mcg Intravenous Q Thu-HD  . heparin  5,000 Units Subcutaneous Q8H  . [START ON 06/24/2017] metoprolol tartrate  25 mg Oral BID  . mometasone-formoterol  2 puff Inhalation BID  . multivitamin  1 tablet Oral Daily  . oxyCODONE  40 mg Oral Q12H  . sevelamer carbonate  2,400 mg Oral BID WC  . vitamin C  250 mg Oral Daily   Dialysis Orders: MWF [place] 3.5 hrs 180 NRe 63 kg  400/Auto 1.5 2.0 K/2.0 Ca  LUA AVF -No heparin -Mircera 225 mcg IV Q 2 week (last dose 05/25/17 Last HGB 8.3 06/16/17) -Venofer 50 mg IV (last dose 06/16/17 Last Fe 72 Ferritin 1051 Tsat 42% 05/25/17)  BMD meds -  Calcium acetate 667 mg 3 caps PO TID AC -Renvela 800 mg PO TID AC and 1 with snack BID Last Phos 4.0 Ca 9.2 C Ca 9.4 PTH 359 (05/25/17)   Assessment/Plan: 1. Cellulitis L ankle: per primary/ortho consulted. Plan for I & D tomorrow. On Ancef per primary.  2. ESRD -MWF. Missed HD 08/22. Will have HD off schedule today and again tomorrow 1st shift to accommodate plans for I & D tomorrow. K+4.0 2.0 K bath. No heparin. Will plan HD today and again on Saturday. Hold HD tomorrow for surgery.  3. Anemia - HGB down to 7.5. No recent ESA dose in center. Give Aranesp 200 mcg IV today. Hold Venofer D/T infection. Follow HGB. May need to transfuse.  4. Secondary hyperparathyroidism - Continue  binders.  5. HTN/volume - BP controlled. Usually takes amlodipine 10 mg PO q HS and Hydralazine 50 mg PO TID. Metoprolol succ 50 mg PO Daily. BP soft D/T pain meds, only on Metoprolol at present. Left under EDW 06/20/17 and 06/17/17. Post wt 61.5 kg. Will lower EDW.  6. Nutrition - On carb mod diet-not a diabetic. Change to renal diet, nepro renal vit  Rita H. Brown NP-C 06/23/2017, 12:13 PM  [facility] [phone]  Pt seen, examined and agree w A/P as above.  Maely Splinter MD [employer] pager [phone]   06/23/2017, 1:56 PM

## 2017-06-23 NOTE — Progress Notes (Signed)
Pharmacy Antibiotic Note  [name] is a 46 y.o. female with hx of ESRD on HD, admitted on 06/22/2017 with LLE cellulitis  Pharmacy has been consulted for ancef dosing. She is afebrile, wbc 5.9K.  Noted pt was admitted recently (8/8-8/14) with the same issue and s/p I&D, culture was positive for MSSA, pt has been off abx since 8/14. Plan for I&D again on Friday  Plan: - Ancef 1g IV Q 24 hrs for now.     Temp (24hrs), Avg:98.2 F (36.8 C), Min:98.1 F (36.7 C), Max:98.4 F (36.9 C)   Recent Labs Lab 06/23/17 0431  WBC 5.9  CREATININE 7.80*  7.64*    Estimated Creatinine Clearance: 7.9 mL/min (A) (by C-G formula based on SCr of 7.8 mg/dL (H)).    Allergies  Allergen Reactions  . Nsaids Other (See Comments)    Renal failure  . Clindamycin/Lincomycin Hives and Rash  . Demerol Nausea And Vomiting and Other (See Comments)    Severe headaches  . Fentanyl Hives, Nausea And Vomiting, Rash and Other (See Comments)    Reaction to patches only  . Other Other (See Comments)    Seeds and nuts- has diverticulitis   . Vancomycin Hives and Other (See Comments)    ? 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  . Clarithromycin Nausea And Vomiting  . Morphine And Related Nausea And Vomiting and Other (See Comments)    Severe headache, Pt does not want it!    Antimicrobials this admission: Ancef 8/23 >>  Dose adjustments this admission:   Microbiology results:  Previous admission: 8/12 L leg abscess - MSSA  Thank you for allowing pharmacy to be a part of this patient's care.  Manley Mason 06/23/2017 11:21 AM

## 2017-06-23 NOTE — Progress Notes (Signed)
Family Medicine Teaching Service Daily Progress Note Intern Pager: [phone]  Patient name: [name] Medical record number: [id number] Date of birth: June 27, 1971 Age: 46 y.o. Gender: female  Primary Care Provider: Greig Right, MD Consultants: Orthopedics, Nephrology (in AM), ID (in AM) Code Status: Full  Pt Overview and Major Events to Date:  [name] a 46 y.o.femalepresenting with left ankle cellulitis recurrence. PMH is significant for ESRD on HD, HTN, bronchitis, anxiety/depression, chronic low back pain.  Assessment and Plan: [name] a 46 y.o.femalepresenting with left ankle cellulitis recurrence. PMH is significant for ESRD on HD, HTN, bronchitis, anxiety/depression, chronic low back pain.  Left ankle cellulitis - Notable purulent drainage with warmth, redness, and swelling on admission, unable to appreciate fluctuance suggestive of abscess. Afebrile, VSS. S/p surgical drainage and wound vac on 8/14 and 8/12, has been off antibiotics since 8/14. Wound cultures at that time showed staph aureus. ID followed during that hospitalization for linezolid given multiple allergies.   - Admit to FPTS, med-surg attending Dr. Ardelia Mems - ancef per ID - Ortho aware of patient (direct admit from ortho clinic) - NPO @[place]  for likely Friday I&D by ortho - will check CBC for leukocytosis - vitals per unit  ESRD on HD, MWF schedule. Nephrologist is Dr. Justin Mend, HD [place]. Last HD 8/20 - Continue home Phoslo 2,001mg  bid with meals, Renvela 2,400mg  bid with meals, and Rena-vit daily - HD planned 8/23 - Check admission CMP and renal function panel in AM  HTN, well-controlled on admission. Patient states she did not take her home amlodipine this AM. Plan to monitor BP with narcotics. If BP can tolerate home amlodipine, will add back in AM.  Anemia on CKD - Baseline Hgb 9-10. Required blood transfusion in last hospitalization. - check CBC for  Hgb -nephro to manage  Anxiety Takes Xanax tid prn at home.  - Xanax QHS as needed for anxiety  Pulmonary disease Unclear if she has had PFTs in the past, but she smokes 1/2 ppd. Per patient, she does not have a diagnosis of COPD.  -continue Albuterol 2 puffs q6hrs prn, and Singulair 10mg  daily  pain - Takes Dilaudid 2mg  bid prn and Oxycodone 40mg  bid at home. Follows with a pain clinic in [place]. - home oxycodone -dilaudid 1mg  q4prn  FEN/GI: renal/carb modified w/fluid restriction Prophylaxis: heparin  Disposition: med-surg  Subjective:  In pain in her leg but nothing beyond her chronic pain otherwise.  Last dialysis was Monday but she thought she was fine. Denied any febrile symptoms or SOB  Objective: Temp:  [98.1 F (36.7 C)-98.4 F (36.9 C)] 98.1 F (36.7 C) (08/23 0700) Pulse Rate:  [80-85] 81 (08/23 0700) Resp:  [14-15] 14 (08/23 0700) BP: (120-129)/(62-80) 129/80 (08/23 0700) SpO2:  [97 %] 97 % (08/23 0700) Physical Exam: General: NAD, pleasant, rests in bed Eyes: PERRL, EOMI, no scleral icterus, no conjunctival pallor or injection ENTM: mucous membranes dry, no pharyngeal erythema or exudate Neck: supple Cardiovascular: RRR, no m/r/g, referred HD fistula sounds Respiratory: CTA bil, no W/R/R Gastrointestinal: soft and nontender, nondistended MSK: moves 4 extremities equally Derm: LLE: Surgical wound with stitches intact, notable purulent drainage from the wound with surrounding erythema and warmth, +edema noted in the ankle Neuro: grossly intact, no deficits noted Psych: AAOx3, appropriate affect  Laboratory:  Recent Labs Lab 06/23/17 0431  WBC 5.9  HGB 7.5*  HCT 24.6*  PLT 271    Recent Labs Lab 06/23/17 0431  NA 139  138  K  4.0  3.9  CL 99*  99*  CO2 30  29  BUN 39*  38*  CREATININE 7.80*  7.64*  CALCIUM 8.9  8.8*  PROT 5.5*  BILITOT 0.9  ALKPHOS 77  ALT 5*  AST 12*  GLUCOSE 77  77      Imaging/Diagnostic  Tests: No results found.   Sherene Sires, DO 06/23/2017, 9:11 AM PGY-1, [place] Intern pager: [phone], text pages welcome

## 2017-06-23 NOTE — Progress Notes (Signed)
   Assessment / Plan:    S/P Procedure(s) (LRB): INCISION AND DRAINAGE ABSCESS LEFT TIBIA AND APPLICATION OF WOUND VAC (Left) by Dr. Mardelle Matte on 06/12/17 and I/D with wound closure by Dr. Ernesta Amble. Murphy on 06/14/17  Active Problems:   Abscess of tendon sheath, left ankle and foot   Cellulitis  Abscess left ankle tendon sheath:  Seen in the office yesterday by Dr. Alain Marion with increased purulence and redness. She was originally prescribed antibiotics at previous discharge, but did not procure or take them.  Additional I&D with wound VAC placement recommended after direct admission to the family medicine service as she has other significant medical conditions and has CKD on HD-she missed her Wednesday HD.  Agree with plan to consult ID given severity and recurrence.   Weight Bearing: touchdown in boot Dressings: maintain dry dressings. We'll place a wound VAC in the OR Friday, 06/24/2017 VTE prophylaxis: heparin, SCDs, ambulation Dispo: to OR for I&D and wound VAC placement 06/24/2017 by Dr. Edmonia Lynch.  Subjective: Patient reports pain as moderate.     Objective:   VITALS:   Vitals:   06/22/17 2300 06/23/17 0053  BP: 126/67 120/62  Pulse: 85 80  Resp: 15 14  Temp: 98.4 F (36.9 C) 98.2 F (36.8 C)  TempSrc: Oral Oral  SpO2: 97% 97%   CBC Latest Ref Rng & Units 06/23/2017 06/14/2017 06/13/2017  WBC 4.0 - 10.5 K/uL 5.9 9.9 12.4(H)  Hemoglobin 12.0 - 15.0 g/dL 7.5(L) 8.5(L) 8.5(L)  Hematocrit 36.0 - 46.0 % 24.6(L) 27.2(L) 27.2(L)  Platelets 150 - 400 K/uL 271 266 263   BMP Latest Ref Rng & Units 06/23/2017 06/23/2017 06/14/2017  Glucose 65 - 99 mg/dL 77 77 108(H)  BUN 6 - 20 mg/dL 39(H) 38(H) 24(H)  Creatinine 0.44 - 1.00 mg/dL 7.80(H) 7.64(H) 4.84(H)  Sodium 135 - 145 mmol/L 139 138 137  Potassium 3.5 - 5.1 mmol/L 4.0 3.9 3.7  Chloride 101 - 111 mmol/L 99(L) 99(L) 95(L)  CO2 22 - 32 mmol/L 30 29 33(H)  Calcium 8.9 - 10.3 mg/dL 8.9 8.8(L) 9.7   Intake/Output    None     Physical Exam: General: NAD.  Asleep on arrival. Alert and interactive. No increased work of breathing. MSK Left lower leg with clean dry dressings in place and overlying Ace wrap.  Feet warm.  EHL, FHL, dorsiflexion, plantar flexion intact. Gross Sensation intact distally Incision: dressing C/D/I   Prudencio Burly III, PA-C 06/23/2017, 6:32 AM

## 2017-06-23 NOTE — Progress Notes (Signed)
Patient arrived to unit per bed.  Reviewed treatment plan and this RN agrees.  Report received from bedside RN, Nira Conn.  Consent obtained.  Patient A & O X 4. Lung sounds diminished to ausculation in all fields. LLE non pitting edema. Cardiac: NSR.  Prepped LUAVF with alcohol and cannulated with two 15 gauge needles.  Pulsation of blood noted.  Flushed access well with saline per protocol.  Connected and secured lines and initiated tx at 1916.  UF goal of 3500 mL and net fluid removal of 3000 mL.  Will continue to monitor.

## 2017-06-23 NOTE — Progress Notes (Signed)
HD tx completed @ 2246 w/o problem, UF goal met, blood rinsed back, VSS, pt received 2 units of PRBC on tx w/o problem or s/s of reaction, report called to Angelo Brickhouse, RN 

## 2017-06-23 NOTE — H&P (Signed)
[facility] Admission History and Physical Service Pager: [phone]  Patient name: [name] Medical record number: [id number] Date of birth: 18-Dec-1970 Age: 46 y.o. Gender: female  Primary Care Provider: Greig Right, MD Consultants: Orthopedics, Nephrology (in AM), ID (in AM) Code Status: Full  Chief Complaint: LLE cellulitis  Assessment and Plan: [name] a 46 y.o.femalepresenting with left ankle cellulitis recurrence. PMH is significant for ESRD on HD, HTN, bronchitis, anxiety/depression, chronic low back pain.  Left ankle cellulitis - Notable purulent drainage with warmth, redness, and swelling on admission, unable to appreciate fluctuance suggestive of abscess. Afebrile, VSS. S/p surgical drainage and wound vac on 8/14 and 8/12, has been off antibiotics since 8/14. Wound cultures at that time showed staph aureus. ID followed during that hospitalization for linezolid given multiple allergies.   - Admit to FPTS, med-surg attending Dr. Ardelia Mems - start oral doxycycline overnight for broad coverage - plan to consult ID for linezolid in AM, IV coverage appropriate given severity of infection in the past hospitalization with recurrence and no coverage over the last week - Ortho aware of patient (direct admit from ortho clinic) - NPO @[place]  for likely Friday I&D by ortho - will check CBC for leukocytosis - vitals per unit - pain control with home medications (oxycontin 40 mg BID, dilaudid 2 mg BID), will require escalated pain meds with surgery  ESRD on HD, MWF schedule. Nephrologist is Dr. Justin Mend, HD [place]. Last HD 8/20 - Continue home Phoslo 2,001mg  bid with meals, Renvela 2,400mg  bid with meals, and Rena-vit daily - consult nephrology in AM for HD - Check admission CMP and renal function panel in AM  HTN, well-controlled on admission. Patient states she did not take her home amlodipine this AM. Plan to monitor BP with  narcotics. If BP can tolerate home amlodipine, will add back in AM.  Anemia on CKD - Baseline Hgb 9-10. Required blood transfusion in last hospitalization. - check CBC for Hgb  Anxiety Takes Xanax tid prn at home.  - Xanax QHS as needed for anxiety  Pulmonary disease Unclear if she has had PFTs in the past, but she smokes 1/2 ppd. Per patient, she does not have a diagnosis of COPD.  -continue Albuterol 2 puffs q6hrs prn, and Singulair 10mg  daily   Chronic low back pain - Takes Dilaudid 2mg  bid prn and Oxycodone 40mg  bid at home. Follows with a pain clinic in [place]. - restart home meds  FEN/GI: renal/carb modified w/fluid restriction Prophylaxis: heparin  Disposition: med-surg  History of Present Illness:  [name] is a 46 y.o. female presenting with recurrence of cellulitis. Patient was hospitalized with left lower extremity celllulitis and abscess s/ I&D on 8/12 and 8/14 by orthopedics. She was discharged from the hospital with antibiotics which were not filled due to patient misunderstanding (had printed scripts for narcotics, electronic scripts for antibiotics). Patient endorses continued pain in her left leg and has been unable to bear weight since hospital discharge. She reports that she did not go to her hospital follow up appointment on Monday with her PCP due to pain, did not go to HD Wednesday.  She was seen in the orthopedics office on 8/22 for follow up and noted to have physical exam signs concerning for re-accumulation requiring repeat clean-out, by ortho and so was admitted to our service from the orthopedics office.   Patient denies fevers, chills, sweats. She states she did not notice drainage, warmth, or swelling from the wound because  it has been wrapped up. She denies N/V/D/C, no urinary symptoms, no dyspnea, CP, cough or congestion.   Review Of Systems: Per HPI.  ROS  Patient Active Problem List   Diagnosis Date Noted  . Cellulitis 06/23/2017   . Cellulitis of left lower extremity   . Tenosynovitis of ankle   . Heart murmur   . Sepsis ([place]) 02/15/2014  . Abscess of tendon sheath, left ankle and foot 02/14/2014  . Thinning of skin 04/09/2013  . Mechanical complication of other vascular device, implant, and graft 04/09/2013  . Pain, limb, left 10/18/2012  . Swollen 10/18/2012  . Status post endometrial ablation 12/10/2011  . ESRD on dialysis ([place]) 11/24/2011  . Renal failure (ARF), acute on chronic (HCC) 10/19/2011  . HTN (hypertension) 10/19/2011  . Anemia 10/19/2011    Past Medical History: Past Medical History:  Diagnosis Date  . Abscess of tendon sheath, left ankle and foot 02/14/2014  . ADD (attention deficit disorder)   . Anemia   . Anemia   . Anxiety   . Arthritis    knee, wrist, back   . Asthma   . Blood transfusion 06/2011   6 units transfused at [facility]  . Bronchitis   . Depression   . Dizziness   . ESRD (end stage renal disease) ([place])    Hemodialysis 3 times a  week - [facility]  . Gastritis   . GERD (gastroesophageal reflux disease)   . Gout   . Headache(784.0)   . Hypertension    sees Dr. Wendie Agreste  . Neuromuscular disorder (HCC)    carpal tunnel  . Obesity   . Reflux   . Renal insufficiency   . Shortness of breath   . Sleep apnea    does not use CPAP    Past Surgical History: Past Surgical History:  Procedure Laterality Date  . AV FISTULA PLACEMENT  06/10/11   Left brachiocephalic AVF  . AV Fistula surgery  11/30/11   [facility] - redo fistula  . Carpel tunnel release     x 2; bilateral  . CESAREAN SECTION    . colonscopy    . DILATION AND CURETTAGE OF UTERUS    . HYSTEROSCOPY WITH THERMACHOICE  12/10/2011   Procedure: HYSTEROSCOPY WITH THERMACHOICE;  Surgeon: Betsy Coder, MD;  Location: [facility];  Service: Gynecology;  Laterality: N/A;  . I&D EXTREMITY Left 06/12/2017   Procedure: IRRIGATION AND DEBRIDEMENT FOOT;  Surgeon: Marchia Bond, MD;  Location: [place];  Service:  Orthopedics;  Laterality: Left;  . I&D EXTREMITY Left 06/14/2017   Procedure: IRRIGATION AND DEBRIDEMENT EXTREMITY/WOUND CLOSURE;  Surgeon: Renette Butters, MD;  Location: [place];  Service: Orthopedics;  Laterality: Left;  . IRRIGATION AND DEBRIDEMENT ABSCESS Left 02/15/2014   Procedure: IRRIGATION AND DRAINAGE OF LOW BACK ABSCESS;  Surgeon: Imogene Burn. Georgette Dover, MD;  Location: [place];  Service: General;  Laterality: Left;  . microdiskectomy  04/2001   Herniated disk L5-S1  . RENAL BIOPSY  04/2004  . REVISON OF ARTERIOVENOUS FISTULA Left 01/04/2017   Procedure: REVISON OF ARTERIOVENOUS FISTULA; CEPHALIC VEIN TURNDOWN;  Surgeon: Angelia Mould, MD;  Location: [facility];  Service: Vascular;  Laterality: Left;  . SHUNTOGRAM  Dec. 18, 2013   Left arm  . SHUNTOGRAM N/A 10/18/2012   Procedure: Earney Mallet;  Surgeon: Conrad [place], MD;  Location: [facility] CATH LAB;  Service: Cardiovascular;  Laterality: N/A;  . SHUNTOGRAM Left 04/18/2013   Procedure: Earney Mallet;  Surgeon: Serafina Mitchell, MD;  Location: [place] CATH LAB;  Service: Cardiovascular;  Laterality: Left;  . SPINE SURGERY  2004   Lumbar diskectomy   . UPPER GASTROINTESTINAL ENDOSCOPY    . WISDOM TOOTH EXTRACTION      Social History: Social History  Substance Use Topics  . Smoking status: Current Every Day Smoker    Packs/day: 0.50    Years: 18.00    Types: Cigarettes  . Smokeless tobacco: Never Used  . Alcohol use Yes     Comment: ocassional   Family History: Family History  Problem Relation Age of Onset  . Hypertension Mother   . Heart disease Father        Heart Disease before age 18  . Hypertension Father   . Heart attack Father   . Hypertension Daughter     Allergies and Medications: Allergies  Allergen Reactions  . Nsaids Other (See Comments)    Renal failure  . Clindamycin/Lincomycin Hives and Rash  . Demerol Nausea And Vomiting and Other (See Comments)    Severe headaches  . Fentanyl Hives, Nausea And Vomiting, Rash and Other  (See Comments)    Reaction to patches only  . Other Other (See Comments)    Seeds and nuts- has diverticulitis   . Vancomycin Hives    ? 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  . Clarithromycin Nausea And Vomiting  . Morphine And Related Nausea And Vomiting and Other (See Comments)    Severe headache, Pt does not want it!   No current facility-administered medications on file prior to encounter.    Current Outpatient Prescriptions on File Prior to Encounter  Medication Sig Dispense Refill  . acetaminophen (TYLENOL) 325 MG tablet Take 2 tablets (650 mg total) by mouth every 8 (eight) hours as needed for mild pain, fever or headache. 30 tablet 0  . albuterol (PROVENTIL HFA;VENTOLIN HFA) 108 (90 BASE) MCG/ACT inhaler Inhale 2 puffs into the lungs every 6 (six) hours as needed for shortness of breath.     . ALPRAZolam (XANAX) 1 MG tablet Take 1 mg by mouth 3 (three) times daily.    Marland Kitchen amLODipine (NORVASC) 10 MG tablet Take 10 mg by mouth at bedtime.     Marland Kitchen b complex vitamins tablet Take 1 tablet by mouth daily.    . budesonide-formoterol (SYMBICORT) 160-4.5 MCG/ACT inhaler Inhale 2 puffs into the lungs 2 (two) times daily.    Marland Kitchen buPROPion (WELLBUTRIN SR) 150 MG 12 hr tablet Take 150 mg by mouth daily.    . calcium acetate (PHOSLO) 667 MG capsule Take 2,001 mg by mouth 2 (two) times daily with a meal.    . cyclobenzaprine (FLEXERIL) 10 MG tablet Take 20 mg by mouth at bedtime.     Marland Kitchen doxycycline (VIBRA-TABS) 100 MG tablet Take 1 tablet (100 mg total) by mouth every 12 (twelve) hours. 60 tablet 0  . DULoxetine (CYMBALTA) 60 MG capsule Take 60 mg by mouth daily.    Marland Kitchen lidocaine-prilocaine (EMLA) cream Apply 1 application topically See admin instructions. Apply topically one hour prior to dialysis - Monday, Wednesday, Friday    . Melatonin 5 MG TABS Take 5 mg by mouth at bedtime.     . methocarbamol (ROBAXIN) 500 MG tablet Take 500 mg by mouth 2 (two) times daily.     . metoprolol tartrate (LOPRESSOR) 25 MG tablet Take 1  tablet (25 mg total) by mouth 2 (two) times daily. 60 tablet 0  . montelukast (SINGULAIR) 10 MG tablet Take 10 mg by mouth daily.     Marland Kitchen  multivitamin (RENA-VIT) TABS tablet Take 1 tablet by mouth daily.    Marland Kitchen oxyCODONE (OXYCONTIN) 40 mg 12 hr tablet Take 40 mg by mouth every 12 (twelve) hours.    . promethazine (PHENERGAN) 25 MG tablet Take 25 mg by mouth every 6 (six) hours as needed for nausea.     . sevelamer carbonate (RENVELA) 800 MG tablet Take 2,400 mg by mouth 2 (two) times daily with a meal.     . SUMAtriptan (IMITREX) 50 MG tablet Take 50 mg by mouth daily as needed for migraine or headache.       Objective: BP 120/62 (BP Location: Right Arm)   Pulse 80   Temp 98.2 F (36.8 C) (Oral)   Resp 14   SpO2 97%  Exam: General: NAD, pleasant, rests in bed Eyes: PERRL, EOMI, no scleral icterus, no conjunctival pallor or injection ENTM: mucous membranes dry, no pharyngeal erythema or exudate Neck: supple Cardiovascular: RRR, no m/r/g, referred HD fistula sounds Respiratory: CTA bil, no W/R/R Gastrointestinal: soft and nontender, nondistended MSK: moves 4 extremities equally Derm: LLE: Surgical wound with stitches intact, notable purulent drainage from the wound with surrounding erythema and warmth, +edema noted in the ankle Neuro: CN II-XII intact Psych: AAOx3, appropriate affect  Labs and Imaging: CBC BMET  No results for input(s): WBC, HGB, HCT, PLT in the last 168 hours. No results for input(s): NA, K, CL, CO2, BUN, CREATININE, GLUCOSE, CALCIUM in the last 168 hours.    Everrett Coombe, MD 06/23/2017, 1:17 AM PGY-2, [facility] pager: [phone], text pages welcome

## 2017-06-24 ENCOUNTER — Inpatient Hospital Stay (HOSPITAL_COMMUNITY): Payer: Medicare Other | Admitting: Anesthesiology

## 2017-06-24 ENCOUNTER — Encounter (HOSPITAL_COMMUNITY): Payer: Self-pay | Admitting: Orthopedic Surgery

## 2017-06-24 ENCOUNTER — Encounter (HOSPITAL_COMMUNITY)
Admission: AD | Disposition: A | Discharge status: Home / Self Care | Payer: Self-pay | Source: Ambulatory Visit | Attending: Family Medicine

## 2017-06-24 HISTORY — PX: INCISION AND DRAINAGE ABSCESS: SHX5864

## 2017-06-24 HISTORY — PX: APPLICATION OF WOUND VAC: SHX5189

## 2017-06-24 LAB — BPAM RBC
BLOOD PRODUCT EXPIRATION DATE: 201809152359
Blood Product Expiration Date: 201809152359
ISSUE DATE / TIME: 201808231953
ISSUE DATE / TIME: 201808231953
UNIT TYPE AND RH: 6200
Unit Type and Rh: 6200

## 2017-06-24 LAB — TYPE AND SCREEN
ABO/RH(D): A POS
ANTIBODY SCREEN: NEGATIVE
Unit division: 0
Unit division: 0

## 2017-06-24 LAB — BASIC METABOLIC PANEL
ANION GAP: 12 (ref 5–15)
BUN: 14 mg/dL (ref 6–20)
CO2: 31 mmol/L (ref 22–32)
Calcium: 9.4 mg/dL (ref 8.9–10.3)
Chloride: 96 mmol/L — ABNORMAL LOW (ref 101–111)
Creatinine, Ser: 4.14 mg/dL — ABNORMAL HIGH (ref 0.44–1.00)
GFR, EST AFRICAN AMERICAN: 14 mL/min — AB (ref >60)
GFR, EST NON AFRICAN AMERICAN: 12 mL/min — AB (ref >60)
GLUCOSE: 101 mg/dL — AB (ref 65–99)
POTASSIUM: 3.8 mmol/L (ref 3.5–5.1)
SODIUM: 139 mmol/L (ref 135–145)

## 2017-06-24 LAB — CBC
HCT: 32.5 % — ABNORMAL LOW (ref 36.0–46.0)
Hemoglobin: 10 g/dL — ABNORMAL LOW (ref 12.0–15.0)
MCH: 28.9 pg (ref 26.0–34.0)
MCHC: 30.8 g/dL (ref 30.0–36.0)
MCV: 93.9 fL (ref 78.0–100.0)
PLATELETS: 273 10*3/uL (ref 150–400)
RBC: 3.46 MIL/uL — AB (ref 3.87–5.11)
RDW: 18 % — ABNORMAL HIGH (ref 11.5–15.5)
WBC: 7 10*3/uL (ref 4.0–10.5)

## 2017-06-24 LAB — SURGICAL PCR SCREEN
MRSA, PCR: NEGATIVE
STAPHYLOCOCCUS AUREUS: NEGATIVE

## 2017-06-24 SURGERY — INCISION AND DRAINAGE, ABSCESS
Anesthesia: General | Site: Leg Lower | Laterality: Left

## 2017-06-24 MED ORDER — ONDANSETRON HCL 4 MG/2ML IJ SOLN
INTRAMUSCULAR | Status: AC
  Filled 2017-06-24: qty 2

## 2017-06-24 MED ORDER — BUPIVACAINE HCL (PF) 0.5 % IJ SOLN
INTRAMUSCULAR | Status: AC
  Filled 2017-06-24: qty 30

## 2017-06-24 MED ORDER — HYDROMORPHONE HCL 1 MG/ML IJ SOLN
INTRAMUSCULAR | Status: AC
  Filled 2017-06-24: qty 1

## 2017-06-24 MED ORDER — MIDAZOLAM HCL 2 MG/2ML IJ SOLN
INTRAMUSCULAR | Status: AC
  Filled 2017-06-24: qty 2

## 2017-06-24 MED ORDER — PROPOFOL 10 MG/ML IV BOLUS
INTRAVENOUS | Status: AC
  Filled 2017-06-24: qty 20

## 2017-06-24 MED ORDER — ONDANSETRON HCL 4 MG/2ML IJ SOLN: 4.0000 mg | Freq: Four times a day (QID) | INTRAMUSCULAR | Status: DC | PRN

## 2017-06-24 MED ORDER — SODIUM CHLORIDE 0.9 % IR SOLN
Status: DC | PRN
  Administered 2017-06-24: 3000 mL
  Administered 2017-06-24: 1000 mL

## 2017-06-24 MED ORDER — LIDOCAINE 2% (20 MG/ML) 5 ML SYRINGE
INTRAMUSCULAR | Status: AC
  Filled 2017-06-24: qty 5

## 2017-06-24 MED ORDER — HYDROXYZINE HCL 10 MG PO TABS
10.0000 mg | ORAL_TABLET | Freq: Once | ORAL | Status: AC
  Administered 2017-06-24: 10 mg via ORAL
  Filled 2017-06-24 (×2): qty 1

## 2017-06-24 MED ORDER — PROPOFOL 10 MG/ML IV BOLUS
INTRAVENOUS | Status: DC | PRN
  Administered 2017-06-24: 150 mg via INTRAVENOUS

## 2017-06-24 MED ORDER — FENTANYL CITRATE (PF) 100 MCG/2ML IJ SOLN
INTRAMUSCULAR | Status: DC | PRN
Start: 2017-06-24 — End: 2017-06-24
  Administered 2017-06-24: 50 ug via INTRAVENOUS
  Administered 2017-06-24: 150 ug via INTRAVENOUS

## 2017-06-24 MED ORDER — HYDROMORPHONE HCL 1 MG/ML IJ SOLN
0.5000 mg | Freq: Once | INTRAMUSCULAR | Status: AC
  Administered 2017-06-24: 0.5 mg via INTRAVENOUS

## 2017-06-24 MED ORDER — OXYCODONE HCL 5 MG PO TABS: 5.0000 mg | ORAL_TABLET | Freq: Once | ORAL | Status: DC | PRN

## 2017-06-24 MED ORDER — HYDROMORPHONE HCL 1 MG/ML IJ SOLN
0.2500 mg | INTRAMUSCULAR | Status: DC | PRN
  Administered 2017-06-24 (×4): 0.5 mg via INTRAVENOUS

## 2017-06-24 MED ORDER — MIDAZOLAM HCL 2 MG/2ML IJ SOLN
INTRAMUSCULAR | Status: DC | PRN
  Administered 2017-06-24 (×2): 2 mg via INTRAVENOUS

## 2017-06-24 MED ORDER — OXYCODONE HCL 5 MG/5ML PO SOLN: 5.0000 mg | Freq: Once | ORAL | Status: DC | PRN

## 2017-06-24 MED ORDER — SODIUM CHLORIDE 0.9 % IV SOLN
INTRAVENOUS | Status: DC
  Administered 2017-06-24 (×2): via INTRAVENOUS

## 2017-06-24 MED ORDER — FENTANYL CITRATE (PF) 250 MCG/5ML IJ SOLN
INTRAMUSCULAR | Status: AC
  Filled 2017-06-24: qty 5

## 2017-06-24 MED ORDER — ONDANSETRON HCL 4 MG/2ML IJ SOLN
INTRAMUSCULAR | Status: DC | PRN
  Administered 2017-06-24: 4 mg via INTRAVENOUS

## 2017-06-24 MED ORDER — SUCCINYLCHOLINE CHLORIDE 200 MG/10ML IV SOSY
PREFILLED_SYRINGE | INTRAVENOUS | Status: AC
  Filled 2017-06-24: qty 10

## 2017-06-24 MED ORDER — DEXAMETHASONE SODIUM PHOSPHATE 10 MG/ML IJ SOLN
INTRAMUSCULAR | Status: DC | PRN
  Administered 2017-06-24: 10 mg via INTRAVENOUS

## 2017-06-24 MED ORDER — LIDOCAINE 2% (20 MG/ML) 5 ML SYRINGE
INTRAMUSCULAR | Status: DC | PRN
  Administered 2017-06-24: 60 mg via INTRAVENOUS

## 2017-06-24 MED ORDER — DEXAMETHASONE SODIUM PHOSPHATE 10 MG/ML IJ SOLN
INTRAMUSCULAR | Status: AC
  Filled 2017-06-24: qty 1

## 2017-06-24 SURGICAL SUPPLY — 54 items
BANDAGE ACE 4X5 VEL STRL LF (GAUZE/BANDAGES/DRESSINGS) ×3 IMPLANT
BANDAGE ACE 6X5 VEL STRL LF (GAUZE/BANDAGES/DRESSINGS) ×3 IMPLANT
BANDAGE ELASTIC 6 VELCRO ST LF (GAUZE/BANDAGES/DRESSINGS) ×3 IMPLANT
BANDAGE ESMARK 6X9 LF (GAUZE/BANDAGES/DRESSINGS) IMPLANT
BLADE SURG 10 STRL SS (BLADE) ×3 IMPLANT
BNDG COHESIVE 4X5 TAN STRL (GAUZE/BANDAGES/DRESSINGS) ×3 IMPLANT
BNDG ESMARK 4X9 LF (GAUZE/BANDAGES/DRESSINGS) IMPLANT
BNDG ESMARK 6X9 LF (GAUZE/BANDAGES/DRESSINGS)
BNDG GAUZE ELAST 4 BULKY (GAUZE/BANDAGES/DRESSINGS) ×3 IMPLANT
CONT SPEC 4OZ CLIKSEAL STRL BL (MISCELLANEOUS) IMPLANT
COVER SURGICAL LIGHT HANDLE (MISCELLANEOUS) ×3 IMPLANT
CUFF TOURN SGL LL 12 NO SLV (MISCELLANEOUS) IMPLANT
CUFF TOURNIQUET SINGLE 34IN LL (TOURNIQUET CUFF) ×3 IMPLANT
DRAPE SURG 17X23 STRL (DRAPES) IMPLANT
DRAPE U-SHAPE 47X51 STRL (DRAPES) IMPLANT
DRESSING VERAFLO CLEANSE CC (GAUZE/BANDAGES/DRESSINGS) ×1 IMPLANT
DRSG PAD ABDOMINAL 8X10 ST (GAUZE/BANDAGES/DRESSINGS) ×3 IMPLANT
DRSG VERAFLO CLEANSE CC (GAUZE/BANDAGES/DRESSINGS) ×3
DURAPREP 26ML APPLICATOR (WOUND CARE) ×3 IMPLANT
ELECT REM PT RETURN 9FT ADLT (ELECTROSURGICAL)
ELECTRODE REM PT RTRN 9FT ADLT (ELECTROSURGICAL) IMPLANT
EVACUATOR 1/8 PVC DRAIN (DRAIN) IMPLANT
FACESHIELD WRAPAROUND (MASK) IMPLANT
GAUZE SPONGE 4X4 12PLY STRL (GAUZE/BANDAGES/DRESSINGS) ×3 IMPLANT
GAUZE XEROFORM 1X8 LF (GAUZE/BANDAGES/DRESSINGS) ×3 IMPLANT
GLOVE BIO SURGEON STRL SZ7.5 (GLOVE) ×6 IMPLANT
GLOVE BIOGEL PI IND STRL 8 (GLOVE) ×2 IMPLANT
GLOVE BIOGEL PI INDICATOR 8 (GLOVE) ×4
GOWN STRL REUS W/ TWL LRG LVL3 (GOWN DISPOSABLE) ×3 IMPLANT
GOWN STRL REUS W/TWL LRG LVL3 (GOWN DISPOSABLE) ×6
HANDPIECE INTERPULSE COAX TIP (DISPOSABLE)
KIT BASIN OR (CUSTOM PROCEDURE TRAY) ×3 IMPLANT
KIT ROOM TURNOVER OR (KITS) ×3 IMPLANT
MANIFOLD NEPTUNE II (INSTRUMENTS) ×3 IMPLANT
NEEDLE 25GAX1.5 (MISCELLANEOUS) IMPLANT
NS IRRIG 1000ML POUR BTL (IV SOLUTION) ×3 IMPLANT
PACK ORTHO EXTREMITY (CUSTOM PROCEDURE TRAY) ×3 IMPLANT
PAD ARMBOARD 7.5X6 YLW CONV (MISCELLANEOUS) ×6 IMPLANT
SET CYSTO W/LG BORE CLAMP LF (SET/KITS/TRAYS/PACK) ×3 IMPLANT
SET HNDPC FAN SPRY TIP SCT (DISPOSABLE) IMPLANT
SPONGE LAP 18X18 X RAY DECT (DISPOSABLE) IMPLANT
STOCKINETTE IMPERVIOUS 9X36 MD (GAUZE/BANDAGES/DRESSINGS) ×3 IMPLANT
SUT ETHILON 3 0 PS 1 (SUTURE) IMPLANT
SUT PDS AB 2-0 CT1 27 (SUTURE) IMPLANT
SWAB CULTURE ESWAB REG 1ML (MISCELLANEOUS) IMPLANT
SYR CONTROL 10ML LL (SYRINGE) IMPLANT
TOWEL OR 17X24 6PK STRL BLUE (TOWEL DISPOSABLE) ×3 IMPLANT
TOWEL OR 17X26 10 PK STRL BLUE (TOWEL DISPOSABLE) ×3 IMPLANT
TUBE CONNECTING 12'X1/4 (SUCTIONS) ×1
TUBE CONNECTING 12X1/4 (SUCTIONS) ×2 IMPLANT
TUBING CYSTO DISP (UROLOGICAL SUPPLIES) IMPLANT
UNDERPAD 30X30 (UNDERPADS AND DIAPERS) ×3 IMPLANT
WND VAC CANISTER 500ML (MISCELLANEOUS) ×3 IMPLANT
YANKAUER SUCT BULB TIP NO VENT (SUCTIONS) ×3 IMPLANT

## 2017-06-24 NOTE — Progress Notes (Signed)
Dr Criss Rosales returned call, and stated he would be up to see pt personally and then he would make a decision about her pain medication. He is currently working with a pt and will then be up.   Pt informed of Dr Fransico Setters plan. Pt verbalized understanding.

## 2017-06-24 NOTE — Anesthesia Procedure Notes (Signed)
Procedure Name: LMA Insertion Date/Time: 06/24/2017 2:40 PM Performed by: Trixie Deis A Pre-anesthesia Checklist: Patient identified, Emergency Drugs available, Suction available and Patient being monitored Patient Re-evaluated:Patient Re-evaluated prior to induction Oxygen Delivery Method: Circle System Utilized Preoxygenation: Pre-oxygenation with 100% oxygen Induction Type: IV induction Ventilation: Mask ventilation without difficulty LMA: LMA inserted LMA Size: 4.0 Number of attempts: 1 Airway Equipment and Method: Bite block Placement Confirmation: positive ETCO2 Tube secured with: Tape Dental Injury: Teeth and Oropharynx as per pre-operative assessment

## 2017-06-24 NOTE — Transfer of Care (Signed)
Immediate Anesthesia Transfer of Care Note  Patient: [name]  Procedure(s) Performed: Procedure(s): INCISION AND DRAINAGE ABSCESS LEFT TIBIA AND APPLICATION OF WOUND VAC (Left) APPLICATION OF WOUND VAC (Left)  Patient Location: PACU  Anesthesia Type:General  Level of Consciousness: awake and patient cooperative  Airway & Oxygen Therapy: Patient Spontanous Breathing and Patient connected to nasal cannula oxygen  Post-op Assessment: Report given to RN, Post -op Vital signs reviewed and stable and Patient moving all extremities  Post vital signs: Reviewed and stable  Last Vitals:  Vitals:   06/24/17 1537 06/24/17 1538  BP: (!) 160/105   Pulse: 78   Resp: 19   Temp:  (!) 36.3 C  SpO2: 99%     Last Pain:  Vitals:   06/24/17 1300  TempSrc: Oral  PainSc:       Patients Stated Pain Goal: 4 (05/21/81 8833)  Complications: No apparent anesthesia complications

## 2017-06-24 NOTE — Progress Notes (Signed)
Pt back from surgery, given po Dilaudid 1mg  for pain. Pt voicing concerns that it doesn't help much, pt was receiving same dose before surgery. Pt requested nurse to call the doctor to get pain medication changed to more. (pt received total of 3mg  dilaudid IV in PACU and 2mg  versed in PACU)     Dr Criss Rosales on for family medicine at [phone], paged. He returned call and informed of the above, he will call back after speaking to his senior resident.

## 2017-06-24 NOTE — Progress Notes (Signed)
[name] Progress Note   Subjective: no c/o  Objective Vitals:   06/23/17 2246 06/23/17 2313 06/24/17 0023 06/24/17 0502  BP: 138/79 (!) 153/80 (!) 158/79 (!) 144/90  Pulse: 77 78 80 72  Resp: 20 20 20 18   Temp:  (!) 97 F (36.1 C) 98.4 F (36.9 C) 98.2 F (36.8 C)  TempSrc:  Oral Oral Oral  SpO2:  97%    Weight:  61.5 kg (135 lb 9.3 oz)     Physical Exam General: WN,WD appears older than stated age. NAD.  Heart: S1,S2,can hear bruit radiated from AVF but no M/G/R Lungs: CTAB A/P Abdomen: Active BS Extremities: No LE edema. LLE ace wrap.  Dialysis Access: LUA AVF aneurysmal, + bruit   Dialysis Orders: MWF [facility]   3.5h  63kg  2/2 bath  LUA AVF  Hep none -Mircera 225 mcg IV Q 2 week (last dose 05/25/17 Last HGB 8.3 06/16/17) -Venofer 50 mg IV (last dose 06/16/17 Last Fe 72 Ferritin 1051 Tsat 42% 05/25/17)  BMD meds -Calcium acetate 667 mg 3 caps PO TID AC -Renvela 800 mg PO TID AC and 1 with snack BID Last Phos 4.0 Ca 9.2 C Ca 9.4 PTH 359 (05/25/17)   Assessment: 1. S/P I&D L leg post tibial tendon sheath abscess (06/12/17 & 06/14/17)- readmitted w/ need for further I&D. To OR today.  2. ESRD -MWF. Plan next HD tomorrow off sched 3. Anemia - HGB down to 7.5; gave Aranesp 200 mcg IV and 2u prbc on 8/23.  No IV FE due to infection 4. Secondary hyperparathyroidism - Continue binders.  5. HTN/volume - home meds are norvasc/ metop, on only metoprolol here 6. Nutrition - On carb mod diet-not a diabetic. Changed to renal diet, nepro renal vit   Plan - HD tomorrow  Sarah Splinter MD [facility] pager [phone]   06/24/2017, 12:45 PM  Additional Objective Labs: Basic Metabolic Panel:  Recent Labs Lab 06/23/17 0431 06/24/17 0420  NA 139  138 139  K 4.0  3.9 3.8  CL 99*  99* 96*  CO2 30  29 31   GLUCOSE 77  77 101*  BUN 39*  38* 14  CREATININE 7.80*  7.64* 4.14*  CALCIUM 8.9  8.8* 9.4  PHOS 6.4*  --    Liver Function  Tests:  Recent Labs Lab 06/23/17 0431  AST 12*  ALT 5*  ALKPHOS 77  BILITOT 0.9  PROT 5.5*  ALBUMIN 2.7*  2.7*   No results for input(s): LIPASE, AMYLASE in the last 168 hours. CBC:  Recent Labs Lab 06/23/17 0431 06/24/17 0420  WBC 5.9 7.0  HGB 7.5* 10.0*  HCT 24.6* 32.5*  MCV 99.6 93.9  PLT 271 273   Blood Culture    Component Value Date/Time   SDES ABSCESS 06/12/2017 1754   SPECREQUEST LEFT TIBIA DEEP 06/12/2017 1754   CULT FEW STAPHYLOCOCCUS AUREUS NO ANAEROBES ISOLATED  06/12/2017 1754   REPTSTATUS 06/17/2017 FINAL 06/12/2017 1754    Cardiac Enzymes: No results for input(s): CKTOTAL, CKMB, CKMBINDEX, TROPONINI in the last 168 hours. CBG: No results for input(s): GLUCAP in the last 168 hours. Iron Studies: No results for input(s): IRON, TIBC, TRANSFERRIN, FERRITIN in the last 72 hours. @lablastinr3 @ Studies/Results: No results found.   Medications: . sodium chloride    . sodium chloride    . sodium chloride    .  ceFAZolin (ANCEF) IV Stopped (06/23/17 2246)  .  ceFAZolin (ANCEF) IV    . lactated ringers  10 mL/hr at 06/23/17 2353   . calcium acetate  2,001 mg Oral BID WC  . darbepoetin (ARANESP) injection - DIALYSIS  200 mcg Intravenous Q Thu-HD  . heparin  5,000 Units Subcutaneous Q8H  . hydrOXYzine  10 mg Oral Once  . metoprolol tartrate  25 mg Oral BID  . mometasone-formoterol  2 puff Inhalation BID  . multivitamin  1 tablet Oral Daily  . nicotine  14 mg Transdermal Daily  . oxyCODONE  40 mg Oral Q12H  . sevelamer carbonate  800 mg Oral BID WC  . vitamin C  250 mg Oral Daily

## 2017-06-24 NOTE — Anesthesia Preprocedure Evaluation (Addendum)
Anesthesia Evaluation  Patient identified by MRN, date of birth, ID band Patient awake    Reviewed: Allergy & Precautions, H&P , NPO status , Patient's Chart, lab work & pertinent test results, reviewed documented beta blocker date and time   Airway Mallampati: II  TM Distance: >3 FB Neck ROM: Full    Dental no notable dental hx. (+) Partial Lower, Partial Upper, Dental Advisory Given   Pulmonary asthma , sleep apnea , Current Smoker,    Pulmonary exam normal breath sounds clear to auscultation       Cardiovascular hypertension, Pt. on medications and Pt. on home beta blockers  Rhythm:Regular Rate:Normal     Neuro/Psych  Headaches, Anxiety Depression    GI/Hepatic Neg liver ROS, GERD  Controlled and Medicated,  Endo/Other  negative endocrine ROS  Renal/GU ESRF and DialysisRenal disease  negative genitourinary   Musculoskeletal  (+) Arthritis , Osteoarthritis,    Abdominal   Peds  Hematology negative hematology ROS (+) anemia ,   Anesthesia Other Findings   Reproductive/Obstetrics negative OB ROS                            Anesthesia Physical Anesthesia Plan  ASA: III  Anesthesia Plan: General   Post-op Pain Management:    Induction: Intravenous  PONV Risk Score and Plan: 3 and Ondansetron, Dexamethasone and Midazolam  Airway Management Planned: LMA and Oral ETT  Additional Equipment:   Intra-op Plan:   Post-operative Plan: Extubation in OR  Informed Consent: I have reviewed the patients History and Physical, chart, labs and discussed the procedure including the risks, benefits and alternatives for the proposed anesthesia with the patient or authorized representative who has indicated his/her understanding and acceptance.   Dental advisory given  Plan Discussed with: CRNA  Anesthesia Plan Comments:         Anesthesia Quick Evaluation

## 2017-06-24 NOTE — Progress Notes (Signed)
Dr Ola Spurr called and asked about the 22gauge iv in place states that its ok to use, if its running well.

## 2017-06-24 NOTE — Op Note (Signed)
06/22/2017 - 06/24/2017  3:27 PM  PATIENT:  [name]    PRE-OPERATIVE DIAGNOSIS:  POSTOPERATIVE WOUND INFECTION  POST-OPERATIVE DIAGNOSIS:  Same  PROCEDURE:  INCISION AND DRAINAGE ABSCESS LEFT TIBIA AND APPLICATION OF WOUND VAC, APPLICATION OF WOUND VAC  SURGEON:  MURPHY, TIMOTHY D, MD  ASSISTANT: Roxan Hockey, PA-C, he was present and scrubbed throughout the case, critical for completion in a timely fashion, and for retraction, instrumentation, and closure.   ANESTHESIA:   gen  PREOPERATIVE INDICATIONS:  [name] is a  46 y.o. female with a diagnosis of POSTOPERATIVE WOUND INFECTION who failed conservative measures and elected for surgical management.    The risks benefits and alternatives were discussed with the patient preoperatively including but not limited to the risks of infection, bleeding, nerve injury, cardiopulmonary complications, the need for revision surgery, among others, and the patient was willing to proceed.  OPERATIVE IMPLANTS: none  OPERATIVE FINDINGS: necrotic tissue  BLOOD LOSS: min  COMPLICATIONS: none  TOURNIQUET TIME: none  OPERATIVE PROCEDURE:  Patient was identified in the preoperative holding area and site was marked by me She was transported to the operating theater and placed on the table in supine position taking care to pad all bony prominences. After a preincinduction time out anesthesia was induced. The left lower extremity was prepped and draped in normal sterile fashion and a pre-incision timeout was performed. She received ancef for preoperative antibiotics.   I opened her previous incision. She has some purulent tissue and some necrotic tissue within her wound. I debrided this using a Ronjair and a Cobb. She had some necrotic tendon that I debrided bone was exposed I used a Cobb to debride the edge of this. This was an excisional debridement for all of these. I performed a T and a lysis and her posterior tibial  tendon  After this thorough debridement I irrigated her incision with 3 L of saline.  I then placed a Provine a wound VAC and had a good seal with this.  Sterile dressing was applied she was awoken and taken the PACU in stable condition  POST OPERATIVE PLAN: continue Abx, dvt px per primary team.

## 2017-06-24 NOTE — Progress Notes (Signed)
Pt still waiting for Dr Criss Rosales to come see her. Next RN, Blanch Media received report and spoke with pt and visitor/family at bedside which verbalizing about pt's pain and need for more pain med, as well as pt wanting more pain med. Waiting Dr Criss Rosales to come up.  Blanch Media, RN informed pt she will call him again if he does not come soon.

## 2017-06-24 NOTE — Progress Notes (Signed)
Family Medicine Teaching Service Daily Progress Note Intern Pager: [phone]  Patient name: [name] Medical record number: [id number] Date of birth: 1971/10/30 Age: 46 y.o. Gender: female  Primary Care Provider: Greig Right, MD Consultants: ortho/nephro Code Status: full  Pt Overview and Major Events to Date:  [name] a 46 y.o.femalepresenting with left ankle cellulitis recurrence. PMH is significant for ESRD on HD, HTN, bronchitis, anxiety/depression, chronic low back pain.  Tranfused 2u during HD without complication on 9/51.  Assessment and Plan: [name] a 46 y.o.femalepresenting with left ankle cellulitis recurrence. PMH is significant for ESRD on HD, HTN, bronchitis, anxiety/depression, chronic low back pain.  Left ankle cellulitis - Notable purulent drainage with warmth, redness, and swelling on dressing this mornign, minimal fluctuance. Afebrile, VSS. .   - Admit to [facility], med-surg attending Dr.Naal - discuss adding doxy - ancef per ID - Ortho aware of patient (direct admit from ortho clinic) - NPO @[place]  for likely Friday I&D by ortho -WC 7.0 - vitals per unit - pain control with home medications (oxycontin 40 mg BID, dilaudid 2 mg BID), could require escalated pain meds with surgery  ESRD on HD, MWF schedule normally. Nephrologist is Dr. Justin Mend, HD [place]. Last HD 8/23, next planned for 8/25 - Continue home Phoslo 2,001mg  bid with meals, Renvela 2,400mg  bid with meals, and Rena-vit daily - nephro started aranesp  HTN, well-controlled on admission. Patient states she did not take her home amlodipine this AM. Plan to monitor BP with narcotics. If BP can tolerate home amlodipine, will add back in AM.  Anemia on CKD - Baseline Hgb 9-10. Transfused 2u 8/23, followup Hgb 10. - daily CBC -aranesp per nephro  Anxiety Takes Xanax tid prn at home.  - Xanax QHS as needed for anxiety  Pulmonary disease Unclear if she has had  PFTs in the past, but she smokes 1/2 ppd. Per patient, she does not have a diagnosis of COPD.  -continue Albuterol 2 puffs q6hrs prn, and Singulair 10mg  daily  Chronic low back pain - Takes Dilaudid 2mg  bid prn and Oxycodone 40mg  bid at home. Follows with a pain clinic in [place]. - restart home meds  Itching around wound- mild, tolerable, requested hydroxyzine -will discuss medication with team  FEN/GI: renal/carb modified w/fluid restriction Prophylaxis: heparin  Disposition: med-surg  Subjective:  Pain is tolerable, some itching in leg, concerned about surgery  Objective: Temp:  [97 F (36.1 C)-98.5 F (36.9 C)] 98.2 F (36.8 C) (08/24 0502) Pulse Rate:  [67-81] 72 (08/24 0502) Resp:  [14-20] 18 (08/24 0502) BP: (127-159)/(66-90) 144/90 (08/24 0502) SpO2:  [95 %-97 %] 97 % (08/23 2313) Weight:  [135 lb 9.3 oz (61.5 kg)-142 lb 3.2 oz (64.5 kg)] 135 lb 9.3 oz (61.5 kg) (08/23 2313) Physical Exam: General: NAD, pleasant, rests in bed Eyes: EOMI, no scleral icterus, no conjunctival pallor or injection Cardiovascular: RRR, no m/r/g, referred HD fistula sounds Respiratory: CTA bil, no W/R/R Gastrointestinal: soft and nontender, nondistended MSK: moves 4 extremities equally Derm: LLE: Surgical wound with stitches intact, notable purulent drainage from the wound with surrounding erythema and warmth Neuro: grossly intact, no deficits noted Psych: AAOx3, appropriate affect  Laboratory:  Recent Labs Lab 06/23/17 0431 06/24/17 0420  WBC 5.9 7.0  HGB 7.5* 10.0*  HCT 24.6* 32.5*  PLT 271 273    Recent Labs Lab 06/23/17 0431 06/24/17 0420  NA 139  138 139  K 4.0  3.9 3.8  CL 99*  99* 96*  CO2 30  29 31   BUN 39*  38* 14  CREATININE 7.80*  7.64* 4.14*  CALCIUM 8.9  8.8* 9.4  PROT 5.5*  --   BILITOT 0.9  --   ALKPHOS 77  --   ALT 5*  --   AST 12*  --   GLUCOSE 77  77 101*      Imaging/Diagnostic Tests: No results found.   Sherene Sires,  DO 06/24/2017, 6:25 AM PGY-1, [place] Intern pager: [phone], text pages welcome

## 2017-06-25 LAB — RENAL FUNCTION PANEL
ALBUMIN: 3 g/dL — AB (ref 3.5–5.0)
ANION GAP: 13 (ref 5–15)
BUN: 41 mg/dL — AB (ref 6–20)
CALCIUM: 9.3 mg/dL (ref 8.9–10.3)
CO2: 28 mmol/L (ref 22–32)
Chloride: 96 mmol/L — ABNORMAL LOW (ref 101–111)
Creatinine, Ser: 6.77 mg/dL — ABNORMAL HIGH (ref 0.44–1.00)
GFR calc Af Amer: 8 mL/min — ABNORMAL LOW (ref >60)
GFR calc non Af Amer: 7 mL/min — ABNORMAL LOW (ref >60)
GLUCOSE: 107 mg/dL — AB (ref 65–99)
PHOSPHORUS: 5.8 mg/dL — AB (ref 2.5–4.6)
Potassium: 3.7 mmol/L (ref 3.5–5.1)
SODIUM: 137 mmol/L (ref 135–145)

## 2017-06-25 LAB — CBC WITH DIFFERENTIAL/PLATELET
BASOS ABS: 0 10*3/uL (ref 0.0–0.1)
BASOS PCT: 0 %
EOS PCT: 0 %
Eosinophils Absolute: 0 10*3/uL (ref 0.0–0.7)
HCT: 29.4 % — ABNORMAL LOW (ref 36.0–46.0)
Hemoglobin: 9.3 g/dL — ABNORMAL LOW (ref 12.0–15.0)
Lymphocytes Relative: 31 %
Lymphs Abs: 2.1 10*3/uL (ref 0.7–4.0)
MCH: 29.5 pg (ref 26.0–34.0)
MCHC: 31.6 g/dL (ref 30.0–36.0)
MCV: 93.3 fL (ref 78.0–100.0)
MONO ABS: 0.3 10*3/uL (ref 0.1–1.0)
Monocytes Relative: 4 %
NEUTROS ABS: 4.3 10*3/uL (ref 1.7–7.7)
Neutrophils Relative %: 65 %
PLATELETS: 226 10*3/uL (ref 150–400)
RBC: 3.15 MIL/uL — ABNORMAL LOW (ref 3.87–5.11)
RDW: 17.2 % — AB (ref 11.5–15.5)
WBC: 6.8 10*3/uL (ref 4.0–10.5)

## 2017-06-25 LAB — HEPATITIS B SURFACE ANTIGEN: Hepatitis B Surface Ag: NEGATIVE

## 2017-06-25 MED ORDER — SODIUM CHLORIDE 0.9 % IV SOLN: 100.0000 mL | INTRAVENOUS | Status: DC | PRN

## 2017-06-25 MED ORDER — LIDOCAINE HCL (PF) 1 % IJ SOLN: 5.0000 mL | INTRAMUSCULAR | Status: DC | PRN

## 2017-06-25 MED ORDER — HYDROMORPHONE HCL 2 MG PO TABS
2.0000 mg | ORAL_TABLET | ORAL | Status: DC | PRN
  Administered 2017-06-25 – 2017-06-28 (×14): 2 mg via ORAL
  Filled 2017-06-25 (×14): qty 1

## 2017-06-25 MED ORDER — LIDOCAINE-PRILOCAINE 2.5-2.5 % EX CREA
1.0000 "application " | TOPICAL_CREAM | CUTANEOUS | Status: DC | PRN
  Filled 2017-06-25: qty 5

## 2017-06-25 MED ORDER — HEPARIN SODIUM (PORCINE) 1000 UNIT/ML DIALYSIS
1000.0000 [IU] | INTRAMUSCULAR | Status: DC | PRN
  Filled 2017-06-25: qty 1

## 2017-06-25 MED ORDER — ALTEPLASE 2 MG IJ SOLR: 2.0000 mg | Freq: Once | INTRAMUSCULAR | Status: DC | PRN

## 2017-06-25 MED ORDER — AMLODIPINE BESYLATE 5 MG PO TABS
5.0000 mg | ORAL_TABLET | Freq: Every day | ORAL | Status: DC
  Administered 2017-06-26 – 2017-06-28 (×3): 5 mg via ORAL
  Filled 2017-06-25 (×3): qty 1

## 2017-06-25 MED ORDER — PENTAFLUOROPROP-TETRAFLUOROETH EX AERO: 1.0000 "application " | INHALATION_SPRAY | CUTANEOUS | Status: DC | PRN

## 2017-06-25 NOTE — Progress Notes (Signed)
[facility] Progress Note   Subjective: no c/o  Objective Vitals:   06/25/17 0018 06/25/17 0532 06/25/17 0759 06/25/17 0804  BP: 140/81 131/82 140/74   Pulse: 72 66 64   Resp:   16   Temp: 98.3 F (36.8 C) 98.4 F (36.9 C) 98 F (36.7 C)   TempSrc: Oral Oral Other (Comment)   SpO2: 100% 99% 100% 99%  Weight:      Height:       Physical Exam General: NAD.  Heart: S1,S2,can hear bruit radiated from AVF but no M/G/R Lungs: CTAB A/P Abdomen: Active BS Extremities: No LE edema. LLE ace wrap.  Dialysis Access: LUA AVF aneurysmal, + bruit  BMD meds -Calcium acetate 667 mg 3 caps PO TID AC -Renvela 800 mg PO TID AC and 1 with snack BID Last Phos 4.0 Ca 9.2 C Ca 9.4 PTH 359 (05/25/17)  Dialysis Orders: MWF    3.5h  63kg  2/2 bath  LUA AVF  Hep none -Mircera 225 mcg IV Q 2 week (last dose 05/25/17 Last HGB 8.3 06/16/17) -Venofer 50 mg IV (last dose 06/16/17 Last Fe 72 Ferritin 1051 Tsat 42% 05/25/17)   Assessment: 1. S/P I&D L leg post tibial tendon sheath abscess (06/12/17 & 06/14/17)- recurrent infection, was not taking any po abx at home.  SP I&D last night 8/24. Need more pain meds will d/w FP. 2. ESRD -MWF. HD today off sched , then Monday 3. Anemia - HGB down to 7.5; gave Aranesp 200 mcg IV and 2u prbc on 8/23.  No IV FE due to infection 4. Secondary hyperparathyroidism - Continue binders.  5. HTN/volume - on norvasc/ metoprolol here as at home; under dry wt, not a big gainer 6. Nutrition - renal diet, nepro renal vit   Plan - HD today off sched, then again Monday    Freddye Splinter MD [facility] pager [phone]   06/24/2017, 12:45 PM  Additional Objective Labs: Basic Metabolic Panel:  Recent Labs Lab 06/23/17 0431 06/24/17 0420  NA 139  138 139  K 4.0  3.9 3.8  CL 99*  99* 96*  CO2 30  29 31   GLUCOSE 77  77 101*  BUN 39*  38* 14  CREATININE 7.80*  7.64* 4.14*  CALCIUM 8.9  8.8* 9.4  PHOS 6.4*  --     Liver Function Tests:  Recent Labs Lab 06/23/17 0431  AST 12*  ALT 5*  ALKPHOS 77  BILITOT 0.9  PROT 5.5*  ALBUMIN 2.7*  2.7*   No results for input(s): LIPASE, AMYLASE in the last 168 hours. CBC:  Recent Labs Lab 06/23/17 0431 06/24/17 0420  WBC 5.9 7.0  HGB 7.5* 10.0*  HCT 24.6* 32.5*  MCV 99.6 93.9  PLT 271 273   Blood Culture    Component Value Date/Time   SDES ABSCESS 06/12/2017 1754   SPECREQUEST LEFT TIBIA DEEP 06/12/2017 1754   CULT FEW STAPHYLOCOCCUS AUREUS NO ANAEROBES ISOLATED  06/12/2017 1754   REPTSTATUS 06/17/2017 FINAL 06/12/2017 1754    Cardiac Enzymes: No results for input(s): CKTOTAL, CKMB, CKMBINDEX, TROPONINI in the last 168 hours. CBG: No results for input(s): GLUCAP in the last 168 hours. Iron Studies: No results for input(s): IRON, TIBC, TRANSFERRIN, FERRITIN in the last 72 hours. @lablastinr3 @ Studies/Results: No results found.   Medications: . sodium chloride    . sodium chloride 10 mL/hr at 06/24/17 1341  .  ceFAZolin (ANCEF) IV Stopped (06/23/17 2246)   . amLODipine  5  mg Oral Daily  . calcium acetate  2,001 mg Oral BID WC  . darbepoetin (ARANESP) injection - DIALYSIS  200 mcg Intravenous Q Thu-HD  . heparin  5,000 Units Subcutaneous Q8H  . metoprolol tartrate  25 mg Oral BID  . mometasone-formoterol  2 puff Inhalation BID  . multivitamin  1 tablet Oral Daily  . nicotine  14 mg Transdermal Daily  . oxyCODONE  40 mg Oral Q12H  . sevelamer carbonate  800 mg Oral BID WC  . vitamin C  250 mg Oral Daily

## 2017-06-25 NOTE — Progress Notes (Signed)
Family Medicine Teaching Service Daily Progress Note Intern Pager: [phone]  Patient name: [name] Medical record number: [id number] Date of birth: [birth year] Age: 46 y.o. Gender: female  Primary Care Provider: Greig Right, MD Consultants: ortho/nephro Code Status: full  Pt Overview and Major Events to Date:  [name] a 46 y.o.femalepresenting with left ankle cellulitis recurrence. PMH is significant for ESRD on HD, HTN, bronchitis, anxiety/depression, chronic low back pain.  Tranfused 2u during HD without complication on 8/85.  Assessment and Plan: [name] a 46 y.o.femalepresenting with left ankle cellulitis recurrence. PMH is significant for ESRD on HD, HTN, bronchitis, anxiety/depression, chronic low back pain.  Left ankle cellulitis - s/p washout 8/24, plan for additional washout 8/27. - ancef per ID - Ortho consulted, appreciate recs - vitals per unit - pain control with home oxycontin 40 mg BID, added dilaudid 1mg  q4hrs  ESRD on HD, MWF schedule normally. Nephrologist is Dr. Justin Mend, HD [place]. Last HD 8/23, next planned for 8/25 - Continue home Phoslo 2,001mg  bid with meals, Renvela 2,400mg  bid with meals, and Rena-vit daily - aranesp  HTN, hypertensive overnight, likely due to pain. Consider adding back reduced dose of norvasc to 5mg , but need to reassess BP after HD. -recheck BP after HD   Anemia on CKD - Baseline Hgb 9-10. Transfused 2u 8/23, followup Hgb 10. - daily CBC - aranesp per nephro  Anxiety Takes Xanax tid prn at home.  - Xanax QHS as needed for anxiety  Pulmonary disease Unclear if she has had PFTs in the past, but she smokes 1/2 ppd. Per patient, she does not have a diagnosis of COPD.  -continue Albuterol 2 puffs q6hrs prn, and Singulair 10mg  daily  Chronic low back pain - Takes Dilaudid 2mg  bid prn and Oxycodone 40mg  bid at home. Follows with a pain clinic in [place]. - continue home  oxycodone, dilaudid at increased dose as above  FEN/GI: renal/carb modified w/fluid restriction Prophylaxis: heparin  Disposition: med-surg  Subjective:  Patient feels well this morning, other than persistent pain. She would like more pain medicine, but understands why we need to stay with the current dosing.   Objective: Temp:  [97.3 F (36.3 C)-99.6 F (37.6 C)] 98.4 F (36.9 C) (08/25 0532) Pulse Rate:  [58-78] 66 (08/25 0532) Resp:  [11-25] 18 (08/24 2051) BP: (131-184)/(74-105) 131/82 (08/25 0532) SpO2:  [95 %-100 %] 99 % (08/25 0532) Weight:  [135 lb 9.3 oz (61.5 kg)] 135 lb 9.3 oz (61.5 kg) (08/24 1340) Physical Exam: General: NAD, pleasant, rests in bed Eyes: EOMI, no scleral icterus, no conjunctival pallor or injection Cardiovascular: RRR, no m/r/g, referred HD fistula sounds Respiratory: CTA bil, no W/R/R Gastrointestinal: soft and nontender, nondistended MSK: moves 4 extremities equally Derm: LLE wrapped in ACE, no leaking Psych: AAOx3, appropriate affect  Laboratory:  Recent Labs Lab 06/23/17 0431 06/24/17 0420  WBC 5.9 7.0  HGB 7.5* 10.0*  HCT 24.6* 32.5*  PLT 271 273    Recent Labs Lab 06/23/17 0431 06/24/17 0420  NA 139  138 139  K 4.0  3.9 3.8  CL 99*  99* 96*  CO2 30  29 31   BUN 39*  38* 14  CREATININE 7.80*  7.64* 4.14*  CALCIUM 8.9  8.8* 9.4  PROT 5.5*  --   BILITOT 0.9  --   ALKPHOS 77  --   ALT 5*  --   AST 12*  --   GLUCOSE 77  77 101*  Imaging/Diagnostic Tests: No results found.   Sela Hilding, MD 06/25/2017, 6:48 AM PGY-2, [place] Intern pager: [phone], text pages welcome

## 2017-06-25 NOTE — Progress Notes (Addendum)
Subjective: 1 Day Post-Op Procedure(s) (LRB): INCISION AND DRAINAGE ABSCESS LEFT TIBIA AND APPLICATION OF WOUND VAC (Left) APPLICATION OF WOUND VAC (Left) Patient reports pain as moderate.  In a fair amount of pain this am.  Objective: Vital signs in last 24 hours: Temp:  [97.3 F (36.3 C)-99.6 F (37.6 C)] 98 F (36.7 C) (08/25 0759) Pulse Rate:  [58-78] 64 (08/25 0759) Resp:  [11-25] 16 (08/25 0759) BP: (131-184)/(74-105) 140/74 (08/25 0759) SpO2:  [95 %-100 %] 99 % (08/25 0804) Weight:  [61.5 kg (135 lb 9.3 oz)] 61.5 kg (135 lb 9.3 oz) (08/24 1340)  Intake/Output from previous day: 08/24 0701 - 08/25 0700 In: 670.5 [P.O.:120; I.V.:500.5; IV Piggyback:50] Out: 21 [Urine:1; Blood:20] Intake/Output this shift: Total I/O In: 240 [P.O.:240] Out: -    Recent Labs  06/23/17 0431 06/24/17 0420  HGB 7.5* 10.0*    Recent Labs  06/23/17 0431 06/24/17 0420  WBC 5.9 7.0  RBC 2.47* 3.46*  HCT 24.6* 32.5*  PLT 271 273    Recent Labs  06/23/17 0431 06/24/17 0420  NA 139  138 139  K 4.0  3.9 3.8  CL 99*  99* 96*  CO2 30  29 31   BUN 39*  38* 14  CREATININE 7.80*  7.64* 4.14*  GLUCOSE 77  77 101*  CALCIUM 8.9  8.8* 9.4   No results for input(s): LABPT, INR in the last 72 hours.  Neurologically intact Neurovascular intact Sensation intact distally Intact pulses distally Dorsiflexion/Plantar flexion intact  Wound vac to LLE in place and draining serosang fluid  Assessment/Plan: 1 Day Post-Op Procedure(s) (LRB): INCISION AND DRAINAGE ABSCESS LEFT TIBIA AND APPLICATION OF WOUND VAC (Left) APPLICATION OF WOUND VAC (Left) Advance diet  TDWB LLE Continue abx per ID Continue plan per medicine Continue to monitor wound vac Plan on repeat I/D with likely wound closure Monday.  Will be NPO after midnight sunday   Fannie Knee 06/25/2017, 9:31 AM

## 2017-06-26 MED ORDER — CEFAZOLIN SODIUM-DEXTROSE 2-4 GM/100ML-% IV SOLN
2.0000 g | INTRAVENOUS | Status: DC
  Administered 2017-06-27 (×2): 2 g via INTRAVENOUS
  Filled 2017-06-26 (×2): qty 100

## 2017-06-26 MED ORDER — LORATADINE 10 MG PO TABS: 10.0000 mg | ORAL_TABLET | Freq: Every day | ORAL | Status: DC | PRN

## 2017-06-26 MED ORDER — DIPHENHYDRAMINE HCL 12.5 MG/5ML PO ELIX
12.5000 mg | ORAL_SOLUTION | Freq: Three times a day (TID) | ORAL | Status: DC | PRN
  Administered 2017-06-26 (×2): 25 mg via ORAL
  Filled 2017-06-26 (×2): qty 10

## 2017-06-26 MED ORDER — LORATADINE 10 MG PO TABS: 10.0000 mg | ORAL_TABLET | Freq: Every day | ORAL | Status: DC

## 2017-06-26 NOTE — Progress Notes (Signed)
[facility] Progress Note   Subjective: pain better. Back to OR tomorrow per pt.  Can't eat the food here.   Objective Vitals:   06/25/17 1630 06/25/17 1657 06/25/17 2021 06/26/17 0600  BP: 112/75 133/80 (!) 146/82 132/83  Pulse: 93 64 67 78  Resp: 18 18 18    Temp:  (!) 96.9 F (36.1 C) 99 F (37.2 C) 98.8 F (37.1 C)  TempSrc:  Oral Oral Oral  SpO2:  98% 100% 100%  Weight:      Height:       Physical Exam General: NAD.  Heart: S1,S2,can hear bruit radiated from AVF but no M/G/R Lungs: CTAB A/P Abdomen: Active BS Extremities: No LE edema. LLE ace wrap.  Dialysis Access: LUA AVF aneurysmal, + bruit  BMD meds -Calcium acetate 667 mg 3 caps PO TID AC -Renvela 800 mg PO TID AC and 1 with snack BID Last Phos 4.0 Ca 9.2 C Ca 9.4 PTH 359 (05/25/17)  Dialysis Orders: MWF [place]   3.5h  63kg  2/2 bath  LUA AVF  Hep none -Mircera 225 mcg IV Q 2 week (last dose 05/25/17 Last HGB 8.3 06/16/17) -Venofer 50 mg IV (last dose 06/16/17 Last Fe 72 Ferritin 1051 Tsat 42% 05/25/17)   Assessment: 1. S/P I&D L leg post tibial tendon sheath abscess (06/12/17 & 06/14/17)- recurrent infection, SP I&D again on 8/24. Possible another trip to OR tomorrow.  Will schedule Monday HD around the procedure. Pain is better controlled.  2. ESRD -MWF. HD Monday 3. Anemia - HGB down to 7.5; gave Aranesp 200 mcg IV and 2u prbc on 8/23.  No IV FE due to infection 4. Secondary hyperparathyroidism - Continue binders.  5. HTN/volume - on norvasc/ metoprolol here as at home; under dry wt, not a big gainer 6. Nutrition - changed to regular diet, she will avoid high K foods; renal vit   Plan - HD Monday    Adelita Splinter MD [facility] pager [phone]   06/24/2017, 12:45 PM  Additional Objective Labs: Basic Metabolic Panel:  Recent Labs Lab 06/23/17 0431 06/24/17 0420 06/25/17 1330  NA 139  138 139 137  K 4.0  3.9 3.8 3.7  CL 99*  99* 96* 96*  CO2 30  29 31 28    GLUCOSE 77  77 101* 107*  BUN 39*  38* 14 41*  CREATININE 7.80*  7.64* 4.14* 6.77*  CALCIUM 8.9  8.8* 9.4 9.3  PHOS 6.4*  --  5.8*   Liver Function Tests:  Recent Labs Lab 06/23/17 0431 06/25/17 1330  AST 12*  --   ALT 5*  --   ALKPHOS 77  --   BILITOT 0.9  --   PROT 5.5*  --   ALBUMIN 2.7*  2.7* 3.0*   No results for input(s): LIPASE, AMYLASE in the last 168 hours. CBC:  Recent Labs Lab 06/23/17 0431 06/24/17 0420 06/25/17 1330  WBC 5.9 7.0 6.8  NEUTROABS  --   --  4.3  HGB 7.5* 10.0* 9.3*  HCT 24.6* 32.5* 29.4*  MCV 99.6 93.9 93.3  PLT 271 273 226   Blood Culture    Component Value Date/Time   SDES ABSCESS 06/12/2017 1754   SPECREQUEST LEFT TIBIA DEEP 06/12/2017 1754   CULT FEW STAPHYLOCOCCUS AUREUS NO ANAEROBES ISOLATED  06/12/2017 1754   REPTSTATUS 06/17/2017 FINAL 06/12/2017 1754    Cardiac Enzymes: No results for input(s): CKTOTAL, CKMB, CKMBINDEX, TROPONINI in the last 168 hours. CBG: No results for  input(s): GLUCAP in the last 168 hours. Iron Studies: No results for input(s): IRON, TIBC, TRANSFERRIN, FERRITIN in the last 72 hours. @lablastinr3 @ Studies/Results: No results found.   Medications: . sodium chloride    . sodium chloride 10 mL/hr at 06/24/17 1341  .  ceFAZolin (ANCEF) IV Stopped (06/25/17 2252)   . amLODipine  5 mg Oral Daily  . calcium acetate  2,001 mg Oral BID WC  . darbepoetin (ARANESP) injection - DIALYSIS  200 mcg Intravenous Q Thu-HD  . heparin  5,000 Units Subcutaneous Q8H  . metoprolol tartrate  25 mg Oral BID  . mometasone-formoterol  2 puff Inhalation BID  . multivitamin  1 tablet Oral Daily  . nicotine  14 mg Transdermal Daily  . oxyCODONE  40 mg Oral Q12H  . sevelamer carbonate  800 mg Oral BID WC  . vitamin C  250 mg Oral Daily

## 2017-06-26 NOTE — Progress Notes (Signed)
Pharmacy Antibiotic Note  [name] is a 46 y.o. female admitted on 06/22/2017 with MSSA LLE cellulitis.  Pharmacy has been consulted for Cefazolin dosing.  Pt to resume MWF dialysis tomorrow.  Aranesp currently scheduled for Thursdays  Plan: Change Cefazolin to 2g IV qMWF with HD Change Aranesp to qFriday  Height: 5\' 4"  (162.6 cm) Weight:  (no bed scale) IBW/kg (Calculated) : 54.7  Temp (24hrs), Avg:98.2 F (36.8 C), Min:96.9 F (36.1 C), Max:99 F (37.2 C)   Recent Labs Lab 06/23/17 0431 06/24/17 0420 06/25/17 1330  WBC 5.9 7.0 6.8  CREATININE 7.80*  7.64* 4.14* 6.77*    Estimated Creatinine Clearance: 9.2 mL/min (A) (by C-G formula based on SCr of 6.77 mg/dL (H)).    Allergies  Allergen Reactions  . Nsaids Other (See Comments)    Renal failure  . Clindamycin/Lincomycin Hives and Rash  . Demerol Nausea And Vomiting and Other (See Comments)    Severe headaches  . Fentanyl Hives, Nausea And Vomiting, Rash and Other (See Comments)    Reaction to patches only  . Other Other (See Comments)    Seeds and nuts- has diverticulitis   . Vancomycin Hives and Other (See Comments)    ? 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  . Clarithromycin Nausea And Vomiting  . Morphine And Related Nausea And Vomiting and Other (See Comments)    Severe headache, Pt does not want it!    Thank you for allowing pharmacy to be a part of this patient's care.   Lewie Chamber., PharmD Clinical Pharmacist [facility]

## 2017-06-26 NOTE — Progress Notes (Signed)
Family Medicine Teaching Service Daily Progress Note Intern Pager: [phone]  Patient name: [name] Medical record number: [id number] Date of birth: 08-30-1971 Age: 46 y.o. Gender: female  Primary Care Provider: Greig Right, MD Consultants: ortho/nephro Code Status: full  Pt Overview and Major Events to Date:  [name] a 46 y.o.femalepresenting with left ankle cellulitis recurrence. PMH is significant for ESRD on HD, HTN, bronchitis, anxiety/depression, chronic low back pain.  Tranfused 2u during HD without complication on 1/32.  Assessment and Plan: [name] a 46 y.o.femalepresenting with left ankle cellulitis recurrence. PMH is significant for ESRD on HD, HTN, bronchitis, anxiety/depression, chronic low back pain.  Left ankle cellulitis - s/p washout 8/24, plan for additional washout 8/27. Afebrile overnight. VSS. No Leukocytosis. - Continue Ancef, day # 4, (8/23 - ) - Appreciate ortho recs - vitals per unit - pain control with home oxycontin 40 mg BID, added dilaudid 1mg  q4hrs - given hx non-adherence to abx at last hospital DC, consider Ancef with HD at DC  ESRD on HD, MWF schedule normally. Nephrologist is Dr. Justin Mend, HD [place]. - Continue home Phoslo 2,001mg  bid with meals, Renvela 2,400mg  bid with meals, and Rena-vit daily - aranesp - HD MWF  HTN, controlled overnight. Home regimen norvasc 10 mg daily. - 5 mg norvasc daily, titrate up to home dose as needed  Anemia 2/2 CKD, stable  - 9.3 this AM. Baseline Hgb 9-10. Transfused 2u 8/23 this admission. - daily CBC - aranesp per nephro  Anxiety Takes Xanax tid prn at home.  - Xanax at home dose was ordered  Pulmonary disease Unclear if she has had PFTs in the past, but she smokes 1/2 ppd. Per patient, she does not have a diagnosis of COPD.  -continue Albuterol 2 puffs q6hrs prn, and Singulair 10mg  daily  Chronic low back pain - Takes Dilaudid 2mg  bid prn and  Oxycodone 40mg  bid at home. Follows with a pain clinic in [place]. - continue home oxycodone, dilaudid at increased dose as above  FEN/GI: renal/carb modified w/fluid restriction Prophylaxis: heparin  Disposition: med-surg  Subjective:  Patient sitting up and eating breakfast this AM, doing well. No acute events overnight. No acute complaints. Agreeable to repeat washout tomorrow.   Objective: Temp:  [96.9 F (36.1 C)-99 F (37.2 C)] 98.8 F (37.1 C) (08/26 0600) Pulse Rate:  [64-93] 78 (08/26 0600) Resp:  [16-18] 18 (08/25 2021) BP: (112-159)/(73-90) 132/83 (08/26 0600) SpO2:  [97 %-100 %] 100 % (08/26 0600) Physical Exam: General: NAD, pleasant and conversant Cardiovascular: RRR, no m/r/g,+ note referred HD fistula sounds Respiratory: CTA bil, no w/r/r Gastrointestinal: soft, nt, nd Derm: LLE wrapped in ACE, bandage clean and dry  Laboratory:  Recent Labs Lab 06/23/17 0431 06/24/17 0420 06/25/17 1330  WBC 5.9 7.0 6.8  HGB 7.5* 10.0* 9.3*  HCT 24.6* 32.5* 29.4*  PLT 271 273 226    Recent Labs Lab 06/23/17 0431 06/24/17 0420 06/25/17 1330  NA 139  138 139 137  K 4.0  3.9 3.8 3.7  CL 99*  99* 96* 96*  CO2 30  29 31 28   BUN 39*  38* 14 41*  CREATININE 7.80*  7.64* 4.14* 6.77*  CALCIUM 8.9  8.8* 9.4 9.3  PROT 5.5*  --   --   BILITOT 0.9  --   --   ALKPHOS 77  --   --   ALT 5*  --   --   AST 12*  --   --  GLUCOSE 77  77 101* 107*     Imaging/Diagnostic Tests: No results found.   Everrett Coombe, MD 06/26/2017, 11:00 AM PGY-2, [place] Intern pager: [phone], text pages welcome

## 2017-06-26 NOTE — Progress Notes (Signed)
Subjective: 2 Days Post-Op Procedure(s) (LRB): INCISION AND DRAINAGE ABSCESS LEFT TIBIA AND APPLICATION OF WOUND VAC (Left) APPLICATION OF WOUND VAC (Left) Patient reports pain as moderate.  Appears to be doing a little better this am.  Still itching despite claritin.  Some nausea but no vomiting.    Objective: Vital signs in last 24 hours: Temp:  [96.9 F (36.1 C)-99 F (37.2 C)] 98.8 F (37.1 C) (08/26 0600) Pulse Rate:  [64-93] 78 (08/26 0600) Resp:  [16-18] 18 (08/25 2021) BP: (112-159)/(73-90) 132/83 (08/26 0600) SpO2:  [97 %-100 %] 100 % (08/26 0600)  Intake/Output from previous day: 08/25 0701 - 08/26 0700 In: 240 [P.O.:240] Out: 2038  Intake/Output this shift: No intake/output data recorded.   Recent Labs  06/24/17 0420 06/25/17 1330  HGB 10.0* 9.3*    Recent Labs  06/24/17 0420 06/25/17 1330  WBC 7.0 6.8  RBC 3.46* 3.15*  HCT 32.5* 29.4*  PLT 273 226    Recent Labs  06/24/17 0420 06/25/17 1330  NA 139 137  K 3.8 3.7  CL 96* 96*  CO2 31 28  BUN 14 41*  CREATININE 4.14* 6.77*  GLUCOSE 101* 107*  CALCIUM 9.4 9.3   No results for input(s): LABPT, INR in the last 72 hours.  Neurologically intact Neurovascular intact Sensation intact distally Intact pulses distally Dorsiflexion/Plantar flexion intact Compartment soft  Wound vac draining serosang fluid  Assessment/Plan: 2 Days Post-Op Procedure(s) (LRB): INCISION AND DRAINAGE ABSCESS LEFT TIBIA AND APPLICATION OF WOUND VAC (Left) APPLICATION OF WOUND VAC (Left) Advance diet  TDWB LLE Plan for repeat I/D and closure tomorrow NPO after midnight Continue wound vac   Fannie Knee 06/26/2017, 9:42 AM

## 2017-06-27 ENCOUNTER — Inpatient Hospital Stay (HOSPITAL_COMMUNITY): Payer: Medicare Other | Admitting: Anesthesiology

## 2017-06-27 ENCOUNTER — Encounter (HOSPITAL_COMMUNITY): Payer: Self-pay | Admitting: Anesthesiology

## 2017-06-27 ENCOUNTER — Encounter (HOSPITAL_COMMUNITY)
Admission: AD | Disposition: A | Discharge status: Home / Self Care | Payer: Self-pay | Source: Ambulatory Visit | Attending: Family Medicine

## 2017-06-27 DIAGNOSIS — G894 Chronic pain syndrome: Secondary | ICD-10-CM

## 2017-06-27 DIAGNOSIS — Z992 Dependence on renal dialysis: Secondary | ICD-10-CM

## 2017-06-27 DIAGNOSIS — N186 End stage renal disease: Secondary | ICD-10-CM

## 2017-06-27 HISTORY — PX: I&D EXTREMITY: SHX5045

## 2017-06-27 HISTORY — PX: I & D EXTREMITY: SHX5045

## 2017-06-27 LAB — RENAL FUNCTION PANEL
ALBUMIN: 3.1 g/dL — AB (ref 3.5–5.0)
Anion gap: 10 (ref 5–15)
BUN: 34 mg/dL — AB (ref 6–20)
CO2: 29 mmol/L (ref 22–32)
Calcium: 9.6 mg/dL (ref 8.9–10.3)
Chloride: 99 mmol/L — ABNORMAL LOW (ref 101–111)
Creatinine, Ser: 5.81 mg/dL — ABNORMAL HIGH (ref 0.44–1.00)
GFR calc Af Amer: 9 mL/min — ABNORMAL LOW (ref >60)
GFR calc non Af Amer: 8 mL/min — ABNORMAL LOW (ref >60)
GLUCOSE: 92 mg/dL (ref 65–99)
PHOSPHORUS: 5.8 mg/dL — AB (ref 2.5–4.6)
POTASSIUM: 4.5 mmol/L (ref 3.5–5.1)
Sodium: 138 mmol/L (ref 135–145)

## 2017-06-27 LAB — CBC
HEMATOCRIT: 32.9 % — AB (ref 36.0–46.0)
Hemoglobin: 10.1 g/dL — ABNORMAL LOW (ref 12.0–15.0)
MCH: 29.8 pg (ref 26.0–34.0)
MCHC: 30.7 g/dL (ref 30.0–36.0)
MCV: 97.1 fL (ref 78.0–100.0)
Platelets: 231 10*3/uL (ref 150–400)
RBC: 3.39 MIL/uL — ABNORMAL LOW (ref 3.87–5.11)
RDW: 17 % — AB (ref 11.5–15.5)
WBC: 4.8 10*3/uL (ref 4.0–10.5)

## 2017-06-27 SURGERY — IRRIGATION AND DEBRIDEMENT EXTREMITY
Anesthesia: General | Laterality: Left

## 2017-06-27 MED ORDER — PROPOFOL 10 MG/ML IV BOLUS
INTRAVENOUS | Status: DC | PRN
  Administered 2017-06-27: 170 mg via INTRAVENOUS

## 2017-06-27 MED ORDER — FENTANYL CITRATE (PF) 250 MCG/5ML IJ SOLN
INTRAMUSCULAR | Status: AC
  Filled 2017-06-27: qty 5

## 2017-06-27 MED ORDER — HEPARIN SODIUM (PORCINE) 1000 UNIT/ML DIALYSIS: 1000.0000 [IU] | INTRAMUSCULAR | Status: DC | PRN

## 2017-06-27 MED ORDER — SODIUM CHLORIDE 0.9 % IV SOLN: 100.0000 mL | INTRAVENOUS | Status: DC | PRN

## 2017-06-27 MED ORDER — EPHEDRINE 5 MG/ML INJ
INTRAVENOUS | Status: AC
  Filled 2017-06-27: qty 10

## 2017-06-27 MED ORDER — HYDROMORPHONE HCL 1 MG/ML IJ SOLN
0.2500 mg | INTRAMUSCULAR | Status: DC | PRN
  Administered 2017-06-27 (×4): 0.5 mg via INTRAVENOUS

## 2017-06-27 MED ORDER — LIDOCAINE HCL (PF) 1 % IJ SOLN: 5.0000 mL | INTRAMUSCULAR | Status: DC | PRN

## 2017-06-27 MED ORDER — 0.9 % SODIUM CHLORIDE (POUR BTL) OPTIME
TOPICAL | Status: DC | PRN
  Administered 2017-06-27: 1000 mL

## 2017-06-27 MED ORDER — LIDOCAINE 2% (20 MG/ML) 5 ML SYRINGE
INTRAMUSCULAR | Status: AC
  Filled 2017-06-27: qty 5

## 2017-06-27 MED ORDER — DEXAMETHASONE SODIUM PHOSPHATE 10 MG/ML IJ SOLN
INTRAMUSCULAR | Status: AC
  Filled 2017-06-27: qty 1

## 2017-06-27 MED ORDER — EPHEDRINE SULFATE 50 MG/ML IJ SOLN
INTRAMUSCULAR | Status: DC | PRN
  Administered 2017-06-27: 5 mg via INTRAVENOUS

## 2017-06-27 MED ORDER — PROMETHAZINE HCL 25 MG/ML IJ SOLN
6.2500 mg | INTRAMUSCULAR | Status: DC | PRN
  Administered 2017-06-27: 6.25 mg via INTRAVENOUS

## 2017-06-27 MED ORDER — ONDANSETRON HCL 4 MG/2ML IJ SOLN
INTRAMUSCULAR | Status: AC
  Filled 2017-06-27: qty 2

## 2017-06-27 MED ORDER — PROPOFOL 10 MG/ML IV BOLUS
INTRAVENOUS | Status: AC
  Filled 2017-06-27: qty 20

## 2017-06-27 MED ORDER — LIDOCAINE HCL (CARDIAC) 20 MG/ML IV SOLN
INTRAVENOUS | Status: DC | PRN
  Administered 2017-06-27: 40 mg via INTRAVENOUS

## 2017-06-27 MED ORDER — MIDAZOLAM HCL 5 MG/5ML IJ SOLN
INTRAMUSCULAR | Status: DC | PRN
  Administered 2017-06-27: 2 mg via INTRAVENOUS

## 2017-06-27 MED ORDER — OXYCODONE HCL 5 MG/5ML PO SOLN: 5.0000 mg | Freq: Once | ORAL | Status: DC | PRN

## 2017-06-27 MED ORDER — SODIUM CHLORIDE 0.9 % IR SOLN
Status: DC | PRN
  Administered 2017-06-27: 3000 mL

## 2017-06-27 MED ORDER — FENTANYL CITRATE (PF) 100 MCG/2ML IJ SOLN
INTRAMUSCULAR | Status: DC | PRN
  Administered 2017-06-27: 50 ug via INTRAVENOUS
  Administered 2017-06-27: 100 ug via INTRAVENOUS
  Administered 2017-06-27 (×2): 50 ug via INTRAVENOUS

## 2017-06-27 MED ORDER — ONDANSETRON HCL 4 MG/2ML IJ SOLN
INTRAMUSCULAR | Status: DC | PRN
  Administered 2017-06-27: 4 mg via INTRAVENOUS

## 2017-06-27 MED ORDER — HYDROMORPHONE HCL 1 MG/ML IJ SOLN
INTRAMUSCULAR | Status: AC
  Administered 2017-06-27: 16:00:00
  Filled 2017-06-27: qty 2

## 2017-06-27 MED ORDER — SODIUM CHLORIDE 0.9 % IV SOLN
INTRAVENOUS | Status: DC
  Administered 2017-06-27: 13:00:00 via INTRAVENOUS

## 2017-06-27 MED ORDER — PENTAFLUOROPROP-TETRAFLUOROETH EX AERO: 1.0000 "application " | INHALATION_SPRAY | CUTANEOUS | Status: DC | PRN

## 2017-06-27 MED ORDER — DEXAMETHASONE SODIUM PHOSPHATE 10 MG/ML IJ SOLN
INTRAMUSCULAR | Status: DC | PRN
  Administered 2017-06-27: 10 mg via INTRAVENOUS

## 2017-06-27 MED ORDER — OXYCODONE HCL 5 MG PO TABS: 5.0000 mg | ORAL_TABLET | Freq: Once | ORAL | Status: DC | PRN

## 2017-06-27 MED ORDER — LIDOCAINE-PRILOCAINE 2.5-2.5 % EX CREA: 1.0000 "application " | TOPICAL_CREAM | CUTANEOUS | Status: DC | PRN

## 2017-06-27 MED ORDER — ALTEPLASE 2 MG IJ SOLR: 2.0000 mg | Freq: Once | INTRAMUSCULAR | Status: DC | PRN

## 2017-06-27 MED ORDER — PROMETHAZINE HCL 25 MG/ML IJ SOLN
INTRAMUSCULAR | Status: AC
  Administered 2017-06-27: 16:00:00
  Filled 2017-06-27: qty 1

## 2017-06-27 MED ORDER — MIDAZOLAM HCL 2 MG/2ML IJ SOLN
INTRAMUSCULAR | Status: AC
  Filled 2017-06-27: qty 2

## 2017-06-27 SURGICAL SUPPLY — 56 items
BANDAGE ACE 4X5 VEL STRL LF (GAUZE/BANDAGES/DRESSINGS) ×3 IMPLANT
BANDAGE ACE 6X5 VEL STRL LF (GAUZE/BANDAGES/DRESSINGS) ×3 IMPLANT
BANDAGE ELASTIC 6 VELCRO ST LF (GAUZE/BANDAGES/DRESSINGS) ×3 IMPLANT
BANDAGE ESMARK 6X9 LF (GAUZE/BANDAGES/DRESSINGS) IMPLANT
BLADE SURG 10 STRL SS (BLADE) ×3 IMPLANT
BNDG COHESIVE 4X5 TAN STRL (GAUZE/BANDAGES/DRESSINGS) ×3 IMPLANT
BNDG ESMARK 4X9 LF (GAUZE/BANDAGES/DRESSINGS) IMPLANT
BNDG ESMARK 6X9 LF (GAUZE/BANDAGES/DRESSINGS)
BNDG GAUZE ELAST 4 BULKY (GAUZE/BANDAGES/DRESSINGS) ×3 IMPLANT
CONT SPEC 4OZ CLIKSEAL STRL BL (MISCELLANEOUS) IMPLANT
COVER SURGICAL LIGHT HANDLE (MISCELLANEOUS) ×3 IMPLANT
CUFF TOURN SGL LL 12 NO SLV (MISCELLANEOUS) IMPLANT
CUFF TOURNIQUET SINGLE 34IN LL (TOURNIQUET CUFF) IMPLANT
DRAPE SURG 17X23 STRL (DRAPES) IMPLANT
DRAPE U-SHAPE 47X51 STRL (DRAPES) IMPLANT
DRSG ADAPTIC 3X8 NADH LF (GAUZE/BANDAGES/DRESSINGS) ×3 IMPLANT
DRSG PAD ABDOMINAL 8X10 ST (GAUZE/BANDAGES/DRESSINGS) ×3 IMPLANT
DURAPREP 26ML APPLICATOR (WOUND CARE) ×3 IMPLANT
ELECT REM PT RETURN 9FT ADLT (ELECTROSURGICAL)
ELECTRODE REM PT RTRN 9FT ADLT (ELECTROSURGICAL) IMPLANT
EVACUATOR 1/8 PVC DRAIN (DRAIN) IMPLANT
FACESHIELD WRAPAROUND (MASK) IMPLANT
GAUZE SPONGE 4X4 12PLY STRL (GAUZE/BANDAGES/DRESSINGS) ×3 IMPLANT
GAUZE XEROFORM 1X8 LF (GAUZE/BANDAGES/DRESSINGS) ×3 IMPLANT
GLOVE BIO SURGEON STRL SZ7.5 (GLOVE) ×6 IMPLANT
GLOVE BIOGEL PI IND STRL 6.5 (GLOVE) ×1 IMPLANT
GLOVE BIOGEL PI IND STRL 7.0 (GLOVE) ×1 IMPLANT
GLOVE BIOGEL PI IND STRL 8 (GLOVE) ×2 IMPLANT
GLOVE BIOGEL PI INDICATOR 6.5 (GLOVE) ×2
GLOVE BIOGEL PI INDICATOR 7.0 (GLOVE) ×2
GLOVE BIOGEL PI INDICATOR 8 (GLOVE) ×4
GOWN STRL REUS W/ TWL LRG LVL3 (GOWN DISPOSABLE) ×3 IMPLANT
GOWN STRL REUS W/TWL LRG LVL3 (GOWN DISPOSABLE) ×6
HANDPIECE INTERPULSE COAX TIP (DISPOSABLE)
KIT BASIN OR (CUSTOM PROCEDURE TRAY) ×3 IMPLANT
KIT ROOM TURNOVER OR (KITS) ×3 IMPLANT
MANIFOLD NEPTUNE II (INSTRUMENTS) ×3 IMPLANT
NEEDLE 25GAX1.5 (MISCELLANEOUS) IMPLANT
NS IRRIG 1000ML POUR BTL (IV SOLUTION) ×3 IMPLANT
PACK ORTHO EXTREMITY (CUSTOM PROCEDURE TRAY) ×3 IMPLANT
PAD ARMBOARD 7.5X6 YLW CONV (MISCELLANEOUS) ×6 IMPLANT
SET CYSTO W/LG BORE CLAMP LF (SET/KITS/TRAYS/PACK) ×3 IMPLANT
SET HNDPC FAN SPRY TIP SCT (DISPOSABLE) IMPLANT
SPONGE LAP 18X18 X RAY DECT (DISPOSABLE) IMPLANT
STOCKINETTE IMPERVIOUS 9X36 MD (GAUZE/BANDAGES/DRESSINGS) ×3 IMPLANT
SUT ETHILON 3 0 PS 1 (SUTURE) ×9 IMPLANT
SUT PDS AB 2-0 CT1 27 (SUTURE) IMPLANT
SWAB CULTURE ESWAB REG 1ML (MISCELLANEOUS) IMPLANT
SYR CONTROL 10ML LL (SYRINGE) IMPLANT
TOWEL OR 17X24 6PK STRL BLUE (TOWEL DISPOSABLE) ×3 IMPLANT
TOWEL OR 17X26 10 PK STRL BLUE (TOWEL DISPOSABLE) ×3 IMPLANT
TUBE CONNECTING 12'X1/4 (SUCTIONS) ×1
TUBE CONNECTING 12X1/4 (SUCTIONS) ×2 IMPLANT
TUBING CYSTO DISP (UROLOGICAL SUPPLIES) IMPLANT
UNDERPAD 30X30 (UNDERPADS AND DIAPERS) ×3 IMPLANT
YANKAUER SUCT BULB TIP NO VENT (SUCTIONS) ×3 IMPLANT

## 2017-06-27 NOTE — Progress Notes (Signed)
Family Medicine Teaching Service Daily Progress Note Intern Pager: [phone]  Patient name: DESHANTE CASSELL Medical record number: [id number] Date of birth: 28-Jan-1971 Age: 46 y.o. Gender: female  Primary Care Provider: Greig Right, MD Consultants: ortho/nephro Code Status: full  Pt Overview and Major Events to Date:  BRINNA DIVELBISS a 46 y.o.femalepresenting with left ankle cellulitis recurrence. PMH is significant for ESRD on HD, HTN, bronchitis, anxiety/depression, chronic low back pain.  Tranfused 2u during HD without complication on 1/06.  Assessment and Plan: EVAGELIA KNACK a 46 y.o.femalepresenting with left ankle cellulitis recurrence. PMH is significant for ESRD on HD, HTN, bronchitis, anxiety/depression, chronic low back pain.  Left ankle cellulitis- s/p washout 8/24, plan for additional washout 8/27. Afebrile overnight. VSS. No Leukocytosis. - Continue Ancef, day # 5, (8/23 - ) - Appreciate ortho recs - vitals per unit - pain control with home oxycontin 40 mg BID, increased dilaudid 2mg  q4hrs - given hx non-adherence to abx at last hospital DC, consider Ancef with HD at DC  ESRD on HD, MWF schedule normally. Nephrologist is Dr. Justin Mend, HD [place]. - Continue home Phoslo 2,001mg  bid with meals, Renvela 2,400mg  bid with meals, and Rena-vit daily - aranesp - HD MWF  HTN, controlled overnight. Home regimen norvasc 10 mg daily. - 5 mg norvasc daily, titrate up to home dose as needed  Anemia 2/2 CKD, stable - 10.1 this AM. Baseline Hgb 9-10. Transfused 2u 8/23 this admission. - daily CBC - aranesp per nephro  Anxiety Takes Xanax tid prn at home.  - Xanax at home dose was ordered  Pulmonary disease Unclear if she has had PFTs in the past, but she smokes 1/2 ppd. Per patient, she does not have a diagnosis of COPD.  -continue Albuterol 2 puffs q6hrs prn, and Singulair 10mg  daily  Chronic low back pain - Takes Dilaudid 2mg  bid prn and  Oxycodone 40mg  bid at home. Follows with a pain clinic in [place]. - continue home oxycodone, dilaudid at increased dose as above  FEN/GI: renal/carb modified w/fluid restriction Prophylaxis: heparin  Disposition:med-surg  Subjective:  Was drowsy on HD, understands she will need antibiotics on discharge.  Pain is managed  Objective: Temp:  [98.4 F (36.9 C)] 98.4 F (36.9 C) (08/26 2046) Pulse Rate:  [73] 73 (08/26 2046) Resp:  [16] 16 (08/26 2046) BP: (126-131)/(74-78) 126/78 (08/26 2046) SpO2:  [100 %] 100 % (08/26 2046) Physical Exam: General: NAD, pleasant and conversant Cardiovascular: RRR, no m/r/g,+ note referred HD fistula sounds, LA fistula Respiratory: CTA bil, no w/r/r Gastrointestinal: soft, nt, nd Derm: LLE wrapped in ACE, bandage clean and dry, wound vac draining serosangionous  Laboratory:  Recent Labs Lab 06/24/17 0420 06/25/17 1330 06/27/17 0355  WBC 7.0 6.8 4.8  HGB 10.0* 9.3* 10.1*  HCT 32.5* 29.4* 32.9*  PLT 273 226 231    Recent Labs Lab 06/23/17 0431 06/24/17 0420 06/25/17 1330 06/27/17 0355  NA 139  138 139 137 138  K 4.0  3.9 3.8 3.7 4.5  CL 99*  99* 96* 96* 99*  CO2 30  29 31 28 29   BUN 39*  38* 14 41* 34*  CREATININE 7.80*  7.64* 4.14* 6.77* 5.81*  CALCIUM 8.9  8.8* 9.4 9.3 9.6  PROT 5.5*  --   --   --   BILITOT 0.9  --   --   --   ALKPHOS 77  --   --   --   ALT 5*  --   --   --  AST 12*  --   --   --   GLUCOSE 77  77 101* 107* 92      Imaging/Diagnostic Tests: No results found.   Sherene Sires, DO 06/27/2017, 6:32 AM PGY-1, [facility] Intern pager: [phone], text pages welcome

## 2017-06-27 NOTE — Progress Notes (Signed)
FPTS Interim Progress Note  Patient requested to be discharged tonight. Ms. Kuhlmann insisted nephro and ortho both told her she could go home today. Discussed that we wanted her to stay the night to ensure a safe DC planning including 1. Getting IV Ancef at HD in outpatient setting and 2.Good ortho follow up. Called Dr. Percell Miller who operated on her earlier today and discussed patient's request with him. He stated he already told her he wanted her to stay the night. Spoke with RN and patient over the phone about this. Will continue to monitor overnight and readdress DC plans in the morning. Appreciate excellent RN care.  Carlyle Dolly, MD 06/27/2017, 8:59 PM PGY-3, [facility] Service pager [phone]

## 2017-06-27 NOTE — H&P (View-Only) (Signed)
Subjective: 2 Days Post-Op Procedure(s) (LRB): INCISION AND DRAINAGE ABSCESS LEFT TIBIA AND APPLICATION OF WOUND VAC (Left) APPLICATION OF WOUND VAC (Left) Patient reports pain as moderate.  Appears to be doing a little better this am.  Still itching despite claritin.  Some nausea but no vomiting.    Objective: Vital signs in last 24 hours: Temp:  [96.9 F (36.1 C)-99 F (37.2 C)] 98.8 F (37.1 C) (08/26 0600) Pulse Rate:  [64-93] 78 (08/26 0600) Resp:  [16-18] 18 (08/25 2021) BP: (112-159)/(73-90) 132/83 (08/26 0600) SpO2:  [97 %-100 %] 100 % (08/26 0600)  Intake/Output from previous day: 08/25 0701 - 08/26 0700 In: 240 [P.O.:240] Out: 2038  Intake/Output this shift: No intake/output data recorded.   Recent Labs  06/24/17 0420 06/25/17 1330  HGB 10.0* 9.3*    Recent Labs  06/24/17 0420 06/25/17 1330  WBC 7.0 6.8  RBC 3.46* 3.15*  HCT 32.5* 29.4*  PLT 273 226    Recent Labs  06/24/17 0420 06/25/17 1330  NA 139 137  K 3.8 3.7  CL 96* 96*  CO2 31 28  BUN 14 41*  CREATININE 4.14* 6.77*  GLUCOSE 101* 107*  CALCIUM 9.4 9.3   No results for input(s): LABPT, INR in the last 72 hours.  Neurologically intact Neurovascular intact Sensation intact distally Intact pulses distally Dorsiflexion/Plantar flexion intact Compartment soft  Wound vac draining serosang fluid  Assessment/Plan: 2 Days Post-Op Procedure(s) (LRB): INCISION AND DRAINAGE ABSCESS LEFT TIBIA AND APPLICATION OF WOUND VAC (Left) APPLICATION OF WOUND VAC (Left) Advance diet  TDWB LLE Plan for repeat I/D and closure tomorrow NPO after midnight Continue wound vac   Fannie Knee 06/26/2017, 9:42 AM

## 2017-06-27 NOTE — Anesthesia Postprocedure Evaluation (Signed)
Anesthesia Post Note  Patient: [name]  Procedure(s) Performed: Procedure(s) (LRB): INCISION AND DRAINAGE ABSCESS LEFT TIBIA AND APPLICATION OF WOUND VAC (Left) APPLICATION OF WOUND VAC (Left)     Patient location during evaluation: PACU Anesthesia Type: General Level of consciousness: awake and alert Pain management: pain level controlled Vital Signs Assessment: post-procedure vital signs reviewed and stable Respiratory status: spontaneous breathing, nonlabored ventilation, respiratory function stable and patient connected to nasal cannula oxygen Cardiovascular status: blood pressure returned to baseline and stable Postop Assessment: no signs of nausea or vomiting Anesthetic complications: no    Last Vitals:  Vitals:   06/26/17 1519 06/26/17 2046  BP: 131/74 126/78  Pulse: 73 73  Resp:  16  Temp: 36.9 C 36.9 C  SpO2: 100% 100%    Last Pain:  Vitals:   06/27/17 0400  TempSrc:   PainSc: [place]

## 2017-06-27 NOTE — Procedures (Signed)
On HD in anticipation of ortho surgery today, stable hemodynamics, will support with out-pt Ancef, if needed post discharge.   [name] C

## 2017-06-27 NOTE — Op Note (Signed)
06/22/2017 - 06/27/2017  3:27 PM  PATIENT:  [name]    PRE-OPERATIVE DIAGNOSIS:  LEFT TIBIA ABSCESS  POST-OPERATIVE DIAGNOSIS:  Same  PROCEDURE:  IRRIGATION AND DEBRIDEMENT LEFT TIBIA  SURGEON:  Beretta Ginsberg D, MD  ASSISTANT: none  ANESTHESIA:   gen  PREOPERATIVE INDICATIONS:  [name] is a  46 y.o. female with a diagnosis of LEFT TIBIA ABSCESS who failed conservative measures and elected for surgical management.    The risks benefits and alternatives were discussed with the patient preoperatively including but not limited to the risks of infection, bleeding, nerve injury, cardiopulmonary complications, the need for revision surgery, among others, and the patient was willing to proceed.  OPERATIVE IMPLANTS: none  OPERATIVE FINDINGS: no purulence  BLOOD LOSS: 50  COMPLICATIONS: none  TOURNIQUET TIME: none  OPERATIVE PROCEDURE:  Patient was identified in the preoperative holding area and site was marked by me She was transported to the operating theater and placed on the table in supine position taking care to pad all bony prominences. After a preincinduction time out anesthesia was induced. The left lwoer extremity was prepped and draped in normal sterile fashion and a pre-incision timeout was performed. She received ancef for preoperative antibiotics.   I removed her retention stitches from her previous surgery I investigated her wound there was no necrotic tissue no purulence I performed a thorough irrigation I did debride some devitalized tissue this was an excisional debridement was scissors.  I thoroughly irrigated her wound  I was able to close her wound with a complex closure with minimal tension. It was roughly 13 cm. Sterile dressings were applied she was awoken and taken the PACU in stable condition  POST OPERATIVE PLAN: Continue abx, WBAT in boot

## 2017-06-27 NOTE — Anesthesia Preprocedure Evaluation (Addendum)
Anesthesia Evaluation  Patient identified by MRN, date of birth, ID band Patient awake    Reviewed: Allergy & Precautions, H&P , NPO status , Patient's Chart, lab work & pertinent test results, reviewed documented beta blocker date and time   Airway Mallampati: II  TM Distance: >3 FB Neck ROM: Full    Dental no notable dental hx. (+) Dental Advisory Given, Poor Dentition   Pulmonary asthma , sleep apnea , Current Smoker,    Pulmonary exam normal breath sounds clear to auscultation       Cardiovascular hypertension, Pt. on medications and Pt. on home beta blockers  Rhythm:Regular Rate:Normal     Neuro/Psych  Headaches, Anxiety Depression    GI/Hepatic Neg liver ROS, GERD  Controlled and Medicated,  Endo/Other  negative endocrine ROS  Renal/GU ESRF and DialysisRenal diseaseLast HD today Monday 8/27  negative genitourinary   Musculoskeletal  (+) Arthritis , Osteoarthritis,    Abdominal   Peds  Hematology negative hematology ROS (+) anemia ,   Anesthesia Other Findings   Reproductive/Obstetrics negative OB ROS                           Anesthesia Physical  Anesthesia Plan  ASA: III  Anesthesia Plan: General   Post-op Pain Management:    Induction: Intravenous  PONV Risk Score and Plan: 3 and Ondansetron, Dexamethasone and Midazolam  Airway Management Planned: LMA  Additional Equipment:   Intra-op Plan:   Post-operative Plan: Extubation in OR  Informed Consent: I have reviewed the patients History and Physical, chart, labs and discussed the procedure including the risks, benefits and alternatives for the proposed anesthesia with the patient or authorized representative who has indicated his/her understanding and acceptance.   Dental advisory given  Plan Discussed with: CRNA  Anesthesia Plan Comments:         Anesthesia Quick Evaluation

## 2017-06-27 NOTE — Transfer of Care (Signed)
Immediate Anesthesia Transfer of Care Note  Patient: [name]  Procedure(s) Performed: Procedure(s): IRRIGATION AND DEBRIDEMENT LEFT TIBIA (Left)  Patient Location: PACU  Anesthesia Type:General  Level of Consciousness: awake, oriented and patient cooperative  Airway & Oxygen Therapy: Patient Spontanous Breathing and Patient connected to nasal cannula oxygen  Post-op Assessment: Report given to RN and Post -op Vital signs reviewed and stable  Post vital signs: Reviewed  Last Vitals:  Vitals:   06/27/17 1038 06/27/17 1521  BP: 135/86 (!) 152/83  Pulse: 72 71  Resp: 15 19  Temp: 36.8 C 36.4 C  SpO2: 95% 100%    Last Pain:  Vitals:   06/27/17 1233  TempSrc:   PainSc: 4       Patients Stated Pain Goal: 1 (31/59/45 8592)  Complications: No apparent anesthesia complications

## 2017-06-27 NOTE — Care Management Important Message (Signed)
Important Message  Patient Details  Name: [name] MRN: [id number] Date of Birth: Feb 03, 1971   Medicare Important Message Given:  Yes    Orbie Pyo 06/27/2017, 2:03 PM

## 2017-06-27 NOTE — Progress Notes (Signed)
Vitals on arrival to 5n22 from PACU

## 2017-06-27 NOTE — Interval H&P Note (Signed)
History and Physical Interval Note:  06/27/2017 2:14 PM  [name]  has presented today for surgery, with the diagnosis of LEFT TIBIA ABSCESS  The various methods of treatment have been discussed with the patient and family. After consideration of risks, benefits and other options for treatment, the patient has consented to  Procedure(s): IRRIGATION AND DEBRIDEMENT LEFT TIBIA AND APPLICATION OF WOUND VAC (Left) as a surgical intervention .  The patient's history has been reviewed, patient examined, no change in status, stable for surgery.  I have reviewed the patient's chart and labs.  Questions were answered to the patient's satisfaction.     Jazae Gandolfi D

## 2017-06-27 NOTE — Progress Notes (Signed)
[facility] Brief Note  Rec'd call from [facility] that she is to be discharged and that primary team needed to know if could get her ATB's at HD Pt getting Ancef 2 gm with each HD  I spoke with RN Either primary team or surgeon just needs to specify the duration of therapy (which can be done via the discharge summary), dose will be 2 grams with each HD for the specified duration  Jamal Maes, MD [place] Pager 06/27/2017, 7:05 PM   .

## 2017-06-27 NOTE — Anesthesia Procedure Notes (Signed)
Procedure Name: LMA Insertion Date/Time: 06/27/2017 2:21 PM Performed by: Luciana Axe K Pre-anesthesia Checklist: Patient identified, Emergency Drugs available, Suction available and Patient being monitored Patient Re-evaluated:Patient Re-evaluated prior to induction Oxygen Delivery Method: Circle System Utilized Preoxygenation: Pre-oxygenation with 100% oxygen Induction Type: IV induction Ventilation: Mask ventilation without difficulty LMA: LMA inserted LMA Size: 4.0 Number of attempts: 1 Airway Equipment and Method: Bite block Placement Confirmation: positive ETCO2 and breath sounds checked- equal and bilateral Tube secured with: Tape Dental Injury: Teeth and Oropharynx as per pre-operative assessment

## 2017-06-28 ENCOUNTER — Encounter (HOSPITAL_COMMUNITY): Payer: Self-pay | Admitting: Orthopedic Surgery

## 2017-06-28 LAB — CBC
HCT: 31.3 % — ABNORMAL LOW (ref 36.0–46.0)
Hemoglobin: 9.6 g/dL — ABNORMAL LOW (ref 12.0–15.0)
MCH: 29.6 pg (ref 26.0–34.0)
MCHC: 30.7 g/dL (ref 30.0–36.0)
MCV: 96.6 fL (ref 78.0–100.0)
PLATELETS: 207 10*3/uL (ref 150–400)
RBC: 3.24 MIL/uL — ABNORMAL LOW (ref 3.87–5.11)
RDW: 16.3 % — AB (ref 11.5–15.5)
WBC: 6.6 10*3/uL (ref 4.0–10.5)

## 2017-06-28 LAB — BASIC METABOLIC PANEL
Anion gap: 8 (ref 5–15)
BUN: 23 mg/dL — AB (ref 6–20)
CO2: 31 mmol/L (ref 22–32)
CREATININE: 4.13 mg/dL — AB (ref 0.44–1.00)
Calcium: 9.7 mg/dL (ref 8.9–10.3)
Chloride: 97 mmol/L — ABNORMAL LOW (ref 101–111)
GFR calc Af Amer: 14 mL/min — ABNORMAL LOW (ref >60)
GFR, EST NON AFRICAN AMERICAN: 12 mL/min — AB (ref >60)
Glucose, Bld: 95 mg/dL (ref 65–99)
POTASSIUM: 4.3 mmol/L (ref 3.5–5.1)
Sodium: 136 mmol/L (ref 135–145)

## 2017-06-28 MED ORDER — CEFAZOLIN IV (FOR PTA / DISCHARGE USE ONLY): 2.0000 g | INTRAVENOUS | Status: AC

## 2017-06-28 MED ORDER — NICOTINE 14 MG/24HR TD PT24: 14.0000 mg | MEDICATED_PATCH | Freq: Every day | TRANSDERMAL | 0 refills | Status: DC

## 2017-06-28 MED ORDER — HYDROMORPHONE HCL 2 MG PO TABS: 1.0000 mg | ORAL_TABLET | Freq: Four times a day (QID) | ORAL | 0 refills | Status: DC | PRN

## 2017-06-28 NOTE — Progress Notes (Signed)
WHEELCHAIR ORDER:  Patient is non-weightbearing on the left foot and is unable to ambulate s/p repeated surgery to remove significant abscess around tendon of left foot.  She will require a wheelchair.  -Dr. Criss Rosales

## 2017-06-28 NOTE — Progress Notes (Signed)
Subjective:  "ready to go home" tolerated hd yest /  Await Ancef duration . Will notify OP kid center for dosing   Objective Vital signs in last 24 hours: Vitals:   06/27/17 1613 06/27/17 2124 06/28/17 0050 06/28/17 0408  BP: 126/78 133/60 (!) 157/74 (!) 166/74  Pulse: 65 70 64 64  Resp: 12 16 18 16   Temp: 97.9 F (36.6 C) 98.3 F (36.8 C) 98.3 F (36.8 C) 98.6 F (37 C)  TempSrc:  Oral Oral Oral  SpO2: 100% 100% 100% 100%  Weight:      Height:       Weight change: -1 kg (-2 lb 3.3 oz)  Physical Exam: General: Alert  Eating brk. NAD.  Heart:  RRR with  bruit radiated from AVF but no M/G/R Lungs: CTAB A/P Abdomen: soft  NTActive BS Extremities: No LE edema. LLE ace wrap.  Dialysis Access: LUA AVF aneurysmal, + bruit  BMD meds -Calcium acetate 667 mg 3 caps PO TID AC -Renvela 800 mg PO TID AC and 1 with snack BID Last Phos 4.0 Ca 9.2 C Ca 9.4 PTH 359 (05/25/17)  Dialysis Orders: MWF [place] [facility]   3.5h  63kg  2/2 bath  LUA AVF  Hep none -Mircera 225 mcg IV Q 2 week (last dose 05/25/17 Last HGB 8.3 06/16/17) -Venofer 50 mg IV (last dose 06/16/17 Last Fe 72 Ferritin 1051 Tsat 42% 05/25/17)   Problem/Plan: 1.Recurrent infection,  L leg post tibial tendon sheath abscess -sp 06/27/17  IRRIGATION AND DEBRIDEMENT LEFT TIBIA  / Ancef  At op Kid center/ I will notify center of dosing  Per ortho recommendations 2. ESRD -MWF. HD tomor op unit 3. Anemia - HGB 9.6  Given  Aranesp 200 mcg IV and 2u prbc on 8/23.  No IV FE due to infection 4. Secondary hyperparathyroidism - Continue binders.  5. HTN/volume - on norvasc/ metoprolol here as at home; under dry wt, not a big gainer/ no change in op edw  6. Nutrition -  In hospital changed to regular diet, she will avoid high K foods; renal vit  Ernest Haber, PA-C [place] [phone] 06/28/2017,8:49 AM  LOS: 6 days    Renal Attending: Pt will need antibiotics as OP and dialysis unit will facilitate but await  duration from ortho. Otherwise stable for d/c from renal perspective. Breindy Meadow C   Labs: Basic Metabolic Panel:  Recent Labs Lab 06/23/17 0431  06/25/17 1330 06/27/17 0355 06/28/17 0621  NA 139  138  < > 137 138 136  K 4.0  3.9  < > 3.7 4.5 4.3  CL 99*  99*  < > 96* 99* 97*  CO2 30  29  < > 28 29 31   GLUCOSE 77  77  < > 107* 92 95  BUN 39*  38*  < > 41* 34* 23*  CREATININE 7.80*  7.64*  < > 6.77* 5.81* 4.13*  CALCIUM 8.9  8.8*  < > 9.3 9.6 9.7  PHOS 6.4*  --  5.8* 5.8*  --   < > = values in this interval not displayed. Liver Function Tests:  Recent Labs Lab 06/23/17 0431 06/25/17 1330 06/27/17 0355  AST 12*  --   --   ALT 5*  --   --   ALKPHOS 77  --   --   BILITOT 0.9  --   --   PROT 5.5*  --   --   ALBUMIN 2.7*  2.7* 3.0* 3.1*  No results for input(s): LIPASE, AMYLASE in the last 168 hours. No results for input(s): AMMONIA in the last 168 hours. CBC:  Recent Labs Lab 06/23/17 0431 06/24/17 0420 06/25/17 1330 06/27/17 0355 06/28/17 0621  WBC 5.9 7.0 6.8 4.8 6.6  NEUTROABS  --   --  4.3  --   --   HGB 7.5* 10.0* 9.3* 10.1* 9.6*  HCT 24.6* 32.5* 29.4* 32.9* 31.3*  MCV 99.6 93.9 93.3 97.1 96.6  PLT 271 273 226 231 207   Cardiac Enzymes: No results for input(s): CKTOTAL, CKMB, CKMBINDEX, TROPONINI in the last 168 hours. CBG: No results for input(s): GLUCAP in the last 168 hours.  Studies/Results: No results found. Medications: . sodium chloride 10 mL/hr at 06/24/17 1341  . sodium chloride 10 mL/hr at 06/27/17 1242  .  ceFAZolin (ANCEF) IV 0 g (06/27/17 1038)   . amLODipine  5 mg Oral Daily  . calcium acetate  2,001 mg Oral BID WC  . darbepoetin (ARANESP) injection - DIALYSIS  200 mcg Intravenous Q Thu-HD  . heparin  5,000 Units Subcutaneous Q8H  . metoprolol tartrate  25 mg Oral BID  . mometasone-formoterol  2 puff Inhalation BID  . multivitamin  1 tablet Oral Daily  . nicotine  14 mg Transdermal Daily  . oxyCODONE  40 mg Oral Q12H   . sevelamer carbonate  800 mg Oral BID WC  . vitamin C  250 mg Oral Daily

## 2017-06-28 NOTE — Discharge Summary (Signed)
[facility] Discharge Summary  Patient name: [name] Medical record number: [id number] Date of birth: 10/27/1971 Age: 46 y.o. Gender: female Date of Admission: 06/22/2017  Date of Discharge: 06/28/17 Admitting Physician: Renette Butters, MD  Primary Care Provider: Greig Right, MD Consultants: nephro/ortho  Indication for Hospitalization: tenosynovitis/abscess s/p incision/drainage by ortho  Discharge Diagnoses/Problem List:  ESRD on HD (MWF) Tenosynovitis/abscess of left ankle s/p incision/drainage by ortho Chronic pain  Disposition: to home  Discharge Condition: stable  Discharge Exam: General: NAD, pleasant and conversant Cardiovascular: RRR, no m/r/g,+ notereferred HD fistula sounds, LA fistula Respiratory: CTA bil, no w/r/r Gastrointestinal: soft, nt, nd Derm: LLE wrapped in ACE, bandage clean and dry per ortho PA   Brief Hospital Course:  Patient was admitted with increased leg pain after being discharged a week prior with infection tonesynovitis and abscess of left foot requiring surgical incision and drainage.   She had not take the prescribed antibiotics and had not attended her followup appts.   She was given pain management and ortho surgery reopened and cleaned out the wound using a wound vac for 48hrs before reevaluating the wound and closing.   She was given Ancef 2G during her HD and arrangements have been made to get her abx via HD until 9/10.  Issues for Follow Up:  1. Needs ancef during HD.   2G per HD treatment until 9/10.   Patient has been made aware, the HD unit was called, it was discussed with nephrologist the day prior to D/C. 2. We prescribed 14 tablets of dilaudid 65m to augment her home regimen for breakthrough pain.  Please coordinate her return to her chronic doses. 3. Patient was instructed to call for ortho appt. 9/3 or 9/5.   Please help make sure they complete that followup.  She was further instructed by  the ortho PA to let ortho change the dressing, to keep it dry, and to call ortho if it got wet. 4. Patient was d/c'd with a wheelchair with strict instructions to not walk on it until cleared by ortho, please help reinforce this message  Significant Procedures: Incision/drainage of wound on left ankle  Significant Labs and Imaging:   Recent Labs Lab 06/25/17 1330 06/27/17 0355 06/28/17 0621  WBC 6.8 4.8 6.6  HGB 9.3* 10.1* 9.6*  HCT 29.4* 32.9* 31.3*  PLT 226 231 207    Recent Labs Lab 06/23/17 0431 06/24/17 0420 06/25/17 1330 06/27/17 0355 06/28/17 0621  NA 139  138 139 137 138 136  K 4.0  3.9 3.8 3.7 4.5 4.3  CL 99*  99* 96* 96* 99* 97*  CO2 _0 GLUCOSE 77  77 101* 107* 92 95  BUN 39*  38* 14 41* 34* 23*  CREATININE 7.80*  7.64* 4.14* 6.77* 5.81* 4.13*  CALCIUM 8.9  8.8* 9.4 9.3 9.6 9.7  PHOS 6.4*  --  5.8* 5.8*  --   ALKPHOS 77  --   --   --   --   AST 12*  --   --   --   --   ALT 5*  --   --   --   --   ALBUMIN 2.7*  2.7*  --  3.0* 3.1*  --     No results found.  Results/Tests Pending at Time of Discharge: N/A  Discharge Medications:  Allergies as of 06/28/2017      Reactions   Nsaids Other (See Comments)  Renal failure   Clindamycin/lincomycin Hives, Rash   Demerol Nausea And Vomiting, Other (See Comments)   Severe headaches   Fentanyl Hives, Nausea And Vomiting, Rash, Other (See Comments)   Reaction to patches only   Other Other (See Comments)   Seeds and nuts- has diverticulitis    Vancomycin Hives, Other (See Comments)   ? 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   Clarithromycin Nausea And Vomiting   Morphine And Related Nausea And Vomiting, Other (See Comments)   Severe headache, Pt does not want it!      Medication List    STOP taking these medications   doxycycline 100 MG tablet Commonly known as:  VIBRA-TABS   methocarbamol 500 MG tablet Commonly known as:  ROBAXIN   promethazine 25 MG tablet Commonly known as:  PHENERGAN      TAKE these medications   acetaminophen 325 MG tablet Commonly known as:  TYLENOL Take 2 tablets (650 mg total) by mouth every 8 (eight) hours as needed for mild pain, fever or headache.   albuterol 108 (90 Base) MCG/ACT inhaler Commonly known as:  PROVENTIL HFA;VENTOLIN HFA Inhale 2 puffs into the lungs every 6 (six) hours as needed for shortness of breath.   ALPRAZolam 1 MG tablet Commonly known as:  XANAX Take 1 mg by mouth 3 (three) times daily.   amLODipine 10 MG tablet Commonly known as:  NORVASC Take 10 mg by mouth at bedtime.   b complex vitamins tablet Take 1 tablet by mouth daily.   budesonide-formoterol 160-4.5 MCG/ACT inhaler Commonly known as:  SYMBICORT Inhale 2 puffs into the lungs 2 (two) times daily.   buPROPion 150 MG 12 hr tablet Commonly known as:  WELLBUTRIN SR Take 150 mg by mouth daily.   calcium acetate 667 MG capsule Commonly known as:  PHOSLO Take 2,001 mg by mouth 2 (two) times daily with a meal.   ceFAZolin IVPB Commonly known as:  ANCEF Inject 2 g into the vein every Monday, Wednesday, and Friday with hemodialysis. Indication:  Tenosynovitis/abscess left ankle s/p ID Last Day of Therapy:  07/12/17 (last dose on 9/10) Labs - Once weekly:  CBC/D and BMP, Labs - Every other week:  ESR and CRP   cyclobenzaprine 10 MG tablet Commonly known as:  FLEXERIL Take 20 mg by mouth at bedtime.   DULoxetine 60 MG capsule Commonly known as:  CYMBALTA Take 60 mg by mouth daily.   HYDROmorphone 2 MG tablet Commonly known as:  DILAUDID Take 0.5 tablets (1 mg total) by mouth every 6 (six) hours as needed for moderate pain or severe pain.   lidocaine-prilocaine cream Commonly known as:  EMLA Apply 1 application topically See admin instructions. Apply topically one hour prior to dialysis - Monday, Wednesday, Friday   Melatonin 5 MG Tabs Take 5 mg by mouth at bedtime.   metoprolol tartrate 25 MG tablet Commonly known as:  LOPRESSOR Take 1 tablet (25 mg  total) by mouth 2 (two) times daily.   montelukast 10 MG tablet Commonly known as:  SINGULAIR Take 10 mg by mouth daily.   multivitamin Tabs tablet Take 1 tablet by mouth daily.   nicotine 14 mg/24hr patch Commonly known as:  NICODERM CQ - dosed in mg/24 hours Place 1 patch (14 mg total) onto the skin daily.   oxyCODONE 40 mg 12 hr tablet Commonly known as:  OXYCONTIN Take 40 mg by mouth every 12 (twelve) hours.   sevelamer carbonate 800 MG tablet Commonly known as:  RENVELA Take 2,400  mg by mouth 2 (two) times daily with a meal.   SUMAtriptan 50 MG tablet Commonly known as:  IMITREX Take 50 mg by mouth daily as needed for migraine or headache.            Home Infusion Instuctions        Start     Ordered   06/28/17 0000  Home infusion instructions [facility] May follow [place] Dosing Protocol; May administer Cathflo as needed to maintain patency of vascular access device.; Flushing of vascular access device: per [facility] Protocol: 0.9% NaCl pre/post medica...    Question Answer Comment  Instructions May follow [place] Dosing Protocol   Instructions May administer Cathflo as needed to maintain patency of vascular access device.   Instructions Flushing of vascular access device: per [facility] Protocol: 0.9% NaCl pre/post medication administration and prn patency; Heparin 100 u/ml, 68m for implanted ports and Heparin 10u/ml, 549mfor all other central venous catheters.   Instructions May follow [facility] Anaphylaxis Protocol for First Dose Administration in the home: 0.9% NaCl at 25-50 ml/hr to maintain IV access for protocol meds. Epinephrine 0.3 ml IV/IM PRN and Benadryl 25-50 IV/IM PRN s/s of anaphylaxis.   Instructions [facility] Infusion Coordinator (RN) to assist per patient IV care needs in the home PRN.      06/28/17 1207       Durable Medical Equipment        Start     Ordered   06/28/17 1153  For home use only DME lightweight manual wheelchair with  seat cushion  Once    Comments:  Patient suffers from recent surgery for tenosynovitis and abscess drainage of left foot which impairs their ability to perform daily activities like bathing, feeding and toileting in the home.  A cane, crutch or walker will not resolve  issue with performing activities of daily living. A wheelchair will allow patient to safely perform daily activities. Patient is not able to propel themselves in the home using a standard weight wheelchair due to arm weakness and HD fistula in left arm. Patient can self propel in the lightweight wheelchair.  Accessories: elevating leg rests (ELRs), wheel locks, extensions and anti-tippers.   06/28/17 1154   06/25/17 1144  For home use only DME lightweight manual wheelchair with seat cushion  Once    Comments:  Patient suffers from left ankle wound, , abscess which impairs their ability to perform daily activities likeambulating  in the home.  A cane will not resolve  issue with performing activities of daily living. A wheelchair will allow patient to safely perform daily activities. Patient is not able to propel themselves in the home using a standard weight wheelchair due to generalized . Patient can self propel in the lightweight wheelchair.  Accessories: elevating leg rests (ELRs), wheel locks, extensions and anti-tippers.   06/25/17 1144   06/24/17 1813  For home use only DME standard manual wheelchair with seat cushion  Once    Comments:  Patient suffers from a left lower leg wound which impairs their ability to perform daily activities like mobilizing in the home.  A walker will not resolve  issue with performing activities of daily living. A wheelchair will allow patient to safely perform daily activities. Patient can safely propel the wheelchair in the home or has a caregiver who can provide assistance.  Accessories: elevating leg rests (ELRs), wheel locks, extensions and anti-tippers.   06/24/17 1812       Discharge  Care  Instructions        Start     Ordered   06/29/17 0000  ceFAZolin (ANCEF) IVPB  Every M-W-F (Hemodialysis)     06/28/17 1207   06/28/17 0000  nicotine (NICODERM CQ - DOSED IN MG/24 HOURS) 14 mg/24hr patch  Daily     06/28/17 1207   06/28/17 0000  HYDROmorphone (DILAUDID) 2 MG tablet  Every 6 hours PRN     06/28/17 1207   06/28/17 0000  Home infusion instructions [facility] May follow [place] Dosing Protocol; May administer Cathflo as needed to maintain patency of vascular access device.; Flushing of vascular access device: per [facility] Protocol: 0.9% NaCl pre/post medica...    Question Answer Comment  Instructions May follow [place] Dosing Protocol   Instructions May administer Cathflo as needed to maintain patency of vascular access device.   Instructions Flushing of vascular access device: per [facility] Protocol: 0.9% NaCl pre/post medication administration and prn patency; Heparin 100 u/ml, 53m for implanted ports and Heparin 10u/ml, 512mfor all other central venous catheters.   Instructions May follow [facility] Anaphylaxis Protocol for First Dose Administration in the home: 0.9% NaCl at 25-50 ml/hr to maintain IV access for protocol meds. Epinephrine 0.3 ml IV/IM PRN and Benadryl 25-50 IV/IM PRN s/s of anaphylaxis.   Instructions [facility] Infusion Coordinator (RN) to assist per patient IV care needs in the home PRN.      06/28/17 1207      Discharge Instructions: Please refer to Patient Instructions section of EMR for full details.  Patient was counseled important signs and symptoms that should prompt return to medical care, changes in medications, dietary instructions, activity restrictions, and follow up appointments.   Follow-Up Appointments:  Patient to call ortho and pcp for followup next week as her transportation allows   BlSherene SiresDO 06/28/2017, 3:26 PM PGY-1, [place]

## 2017-06-28 NOTE — Discharge Instructions (Signed)
Ma'am,  You were seen again in the hospital for an infection around the tendon of your left ankle.  It is very important that you followup with ortho as instructed either Monday or Wednesday next week.   You will be getting your antibiotics through your hemodialysis so please double check with them that is happening for the next 2 wks.   We have given you some breakthrough pain medication to get you through the next week and then you should be back to your chronic pain medications.  Please contact a doctor if you start developing a fever or lose sensation in your foot.   Please do not bear any weight on your foot until told to do so by ortho.

## 2017-06-28 NOTE — Anesthesia Postprocedure Evaluation (Signed)
Anesthesia Post Note  Patient: [name]  Procedure(s) Performed: Procedure(s) (LRB): IRRIGATION AND DEBRIDEMENT LEFT TIBIA (Left)     Anesthesia Type: General    Last Vitals:  Vitals:   06/28/17 0050 06/28/17 0408  BP: (!) 157/74 (!) 166/74  Pulse: 64 64  Resp: 18 16  Temp: 36.8 C 37 C  SpO2: 100% 100%    Last Pain:  Vitals:   06/28/17 0419  TempSrc:   PainSc: [place]

## 2017-06-28 NOTE — Progress Notes (Signed)
Family Medicine Teaching Service Daily Progress Note Intern Pager: [phone]  Patient name: [name] Medical record number: [id number] Date of birth: October 28, 1971 Age: 46 y.o. Gender: female  Primary Care Provider: Greig Right, MD Consultants: ortho/nephro Code Status: full  Pt Overview and Major Events to Date:  [name] a 46 y.o.femalepresenting with left ankle cellulitis recurrence. PMH is significant for ESRD on HD, HTN, bronchitis, anxiety/depression, chronic low back pain. Tranfused 2u during HD without complication on 2/29.  Assessment and Plan: [name] a 46 y.o.femalepresenting with left ankle cellulitis recurrence. PMH is significant for ESRD on HD, HTN, bronchitis, anxiety/depression, chronic low back pain.  Left ankle cellulitis- s/p washout 8/24, plan for additional washout 8/27. Afebrile overnight. VSS. No Leukocytosis. - Continue Ancef, day # 6, (8/23 - ) - Appreciate ortho recs - vitals per unit - pain control with home oxycontin 40 mg BID, increased dilaudid 2mg  q4hrs - given hx non-adherence to abx at last hospital DC,  Ancef with HD at DC (discuss duration) -patient told by ortho PA to not change dressing herself, needs to followup on mon/wed next week  ESRD on HD, MWF schedule normally. Nephrologist is Dr. Justin Mend, [place]. - Continue home Phoslo 2,001mg  bid with meals, Renvela 2,400mg  bid with meals, and Rena-vit daily - aranesp - HD MWF  HTN, 170s/60s. Home regimen norvasc 10mg  daily. - 5 mg norvasc daily, titrate up to home dose as needed 25 metroprolol tartrate BID  Anemia 2/2CKD, stable - 10.1 this AM. Baseline Hgb 9-10. Transfused 2u 8/23 this admission. - daily CBC - aranesp per nephro  Anxiety Takes Xanax tid prn at home.  - Xanax at home dose was ordered  Pulmonary disease Unclear if she has had PFTs in the past, but she smokes 1/2 ppd. Per patient, she does not have a diagnosis of COPD.   -continue Albuterol 2 puffs q6hrs prn, and Singulair 10mg  daily  Chronic low back pain - Takes Dilaudid 2mg  bid prn and Oxycodone 40mg  bid at home. Follows with a pain clinic in [place]. - continue home oxycodone, dilaudid at increased dose as above -discuss D/C pain meds  FEN/GI: renal/carb modified w/fluid restriction Prophylaxis: heparin  Disposition:med-surg  Subjective:  Patient felt good, fixated on D/C  Objective: Temp:  [97.6 F (36.4 C)-98.6 F (37 C)] 98.6 F (37 C) (08/28 0408) Pulse Rate:  [60-77] 64 (08/28 0408) Resp:  [7-19] 16 (08/28 0408) BP: (124-166)/(60-88) 166/74 (08/28 0408) SpO2:  [95 %-100 %] 100 % (08/28 0408) Weight:  [139 lb 15.9 oz (63.5 kg)-142 lb 3.2 oz (64.5 kg)] 139 lb 15.9 oz (63.5 kg) (08/27 1243) Physical Exam: General: NAD, pleasant and conversant Cardiovascular: RRR, no m/r/g,+ notereferred HD fistula sounds, LA fistula Respiratory: CTA bil, no w/r/r Gastrointestinal: soft, nt, nd Derm: LLE wrapped in ACE, bandage clean and dry per ortho PA   Laboratory:  Recent Labs Lab 06/24/17 0420 06/25/17 1330 06/27/17 0355  WBC 7.0 6.8 4.8  HGB 10.0* 9.3* 10.1*  HCT 32.5* 29.4* 32.9*  PLT 273 226 231    Recent Labs Lab 06/23/17 0431 06/24/17 0420 06/25/17 1330 06/27/17 0355  NA 139  138 139 137 138  K 4.0  3.9 3.8 3.7 4.5  CL 99*  99* 96* 96* 99*  CO2 30  29 31 28 29   BUN 39*  38* 14 41* 34*  CREATININE 7.80*  7.64* 4.14* 6.77* 5.81*  CALCIUM 8.9  8.8* 9.4 9.3 9.6  PROT 5.5*  --   --   --  BILITOT 0.9  --   --   --   ALKPHOS 77  --   --   --   ALT 5*  --   --   --   AST 12*  --   --   --   GLUCOSE 77  77 101* 107* 92      Imaging/Diagnostic Tests: No results found.   Sherene Sires, DO 06/28/2017, 6:00 AM PGY-1, [place] Intern pager: [phone], text pages welcome

## 2017-06-28 NOTE — Progress Notes (Signed)
   Assessment / Plan: 1 Day Post-Op  S/P Procedure(s) (LRB): IRRIGATION AND DEBRIDEMENT LEFT TIBIA (Left) by Dr. Ernesta Amble. Percell Miller on 06/27/17  Active Problems:   Abscess of tendon sheath, left ankle and foot   Cellulitis   Chronic pain syndrome  Left Ankle abscess Surgical site stable with dressings in place. Continue antibiotic therapy w/ HD Follow up in the office with Dr. Alain Marion in 1 weeks.  Please call with questions. TDWB - Walking boot   Subjective: Patient reports pain as moderate.   Objective:   VITALS:   Vitals:   06/27/17 2124 06/28/17 0050 06/28/17 0408 06/28/17 0938  BP: 133/60 (!) 157/74 (!) 166/74   Pulse: 70 64 64   Resp: 16 18 16    Temp: 98.3 F (36.8 C) 98.3 F (36.8 C) 98.6 F (37 C)   TempSrc: Oral Oral Oral   SpO2: 100% 100% 100% 97%  Weight:      Height:       CBC Latest Ref Rng & Units 06/28/2017 06/27/2017 06/25/2017  WBC 4.0 - 10.5 K/uL 6.6 4.8 6.8  Hemoglobin 12.0 - 15.0 g/dL 9.6(L) 10.1(L) 9.3(L)  Hematocrit 36.0 - 46.0 % 31.3(L) 32.9(L) 29.4(L)  Platelets 150 - 400 K/uL 207 231 226   BMP Latest Ref Rng & Units 06/28/2017 06/27/2017 06/25/2017  Glucose 65 - 99 mg/dL 95 92 107(H)  BUN 6 - 20 mg/dL 23(H) 34(H) 41(H)  Creatinine 0.44 - 1.00 mg/dL 4.13(H) 5.81(H) 6.77(H)  Sodium 135 - 145 mmol/L 136 138 137  Potassium 3.5 - 5.1 mmol/L 4.3 4.5 3.7  Chloride 101 - 111 mmol/L 97(L) 99(L) 96(L)  CO2 22 - 32 mmol/L 31 29 28   Calcium 8.9 - 10.3 mg/dL 9.7 9.6 9.3   Intake/Output      08/27 0701 - 08/28 0700 08/28 0701 - 08/29 0700   P.O. 240 240   I.V. (mL/kg) 1215 (19.1)    IV Piggyback 200    Total Intake(mL/kg) 1655 (26.1) 240 (3.8)   Other 1000    Blood 2    Total Output 1002     Net +653 +240          Physical Exam: General: NAD.   MSK Neurovascularly intact Sensation intact distally Intact pulses distally Dorsiflexion/Plantar flexion intact Incision: dressing C/D/I  Prudencio Burly III, PA-C 06/28/2017, 10:27 AM

## 2017-06-28 NOTE — Progress Notes (Signed)
Pt. refusing Heparin SQ- states that she's not suppose to take it.

## 2017-06-29 DIAGNOSIS — N2581 Secondary hyperparathyroidism of renal origin: Secondary | ICD-10-CM | POA: Diagnosis not present

## 2017-06-29 DIAGNOSIS — D509 Iron deficiency anemia, unspecified: Secondary | ICD-10-CM | POA: Diagnosis not present

## 2017-06-29 DIAGNOSIS — M65062 Abscess of tendon sheath, left lower leg: Secondary | ICD-10-CM | POA: Diagnosis not present

## 2017-06-29 DIAGNOSIS — N186 End stage renal disease: Secondary | ICD-10-CM | POA: Diagnosis not present

## 2017-07-01 DIAGNOSIS — D509 Iron deficiency anemia, unspecified: Secondary | ICD-10-CM | POA: Diagnosis not present

## 2017-07-01 DIAGNOSIS — Z992 Dependence on renal dialysis: Secondary | ICD-10-CM | POA: Diagnosis not present

## 2017-07-01 DIAGNOSIS — M65062 Abscess of tendon sheath, left lower leg: Secondary | ICD-10-CM | POA: Diagnosis not present

## 2017-07-01 DIAGNOSIS — I120 Hypertensive chronic kidney disease with stage 5 chronic kidney disease or end stage renal disease: Secondary | ICD-10-CM | POA: Diagnosis not present

## 2017-07-01 DIAGNOSIS — N186 End stage renal disease: Secondary | ICD-10-CM | POA: Diagnosis not present

## 2017-07-01 DIAGNOSIS — N2581 Secondary hyperparathyroidism of renal origin: Secondary | ICD-10-CM | POA: Diagnosis not present

## 2017-07-04 DIAGNOSIS — N186 End stage renal disease: Secondary | ICD-10-CM | POA: Diagnosis not present

## 2017-07-04 DIAGNOSIS — D631 Anemia in chronic kidney disease: Secondary | ICD-10-CM | POA: Diagnosis not present

## 2017-07-04 DIAGNOSIS — N2581 Secondary hyperparathyroidism of renal origin: Secondary | ICD-10-CM | POA: Diagnosis not present

## 2017-07-04 DIAGNOSIS — M65062 Abscess of tendon sheath, left lower leg: Secondary | ICD-10-CM | POA: Diagnosis not present

## 2017-07-05 DIAGNOSIS — N186 End stage renal disease: Secondary | ICD-10-CM | POA: Diagnosis not present

## 2017-07-05 DIAGNOSIS — M65062 Abscess of tendon sheath, left lower leg: Secondary | ICD-10-CM | POA: Diagnosis not present

## 2017-07-05 DIAGNOSIS — N2581 Secondary hyperparathyroidism of renal origin: Secondary | ICD-10-CM | POA: Diagnosis not present

## 2017-07-05 DIAGNOSIS — D631 Anemia in chronic kidney disease: Secondary | ICD-10-CM | POA: Diagnosis not present

## 2017-07-06 DIAGNOSIS — M549 Dorsalgia, unspecified: Secondary | ICD-10-CM | POA: Diagnosis not present

## 2017-07-06 DIAGNOSIS — G43819 Other migraine, intractable, without status migrainosus: Secondary | ICD-10-CM | POA: Diagnosis not present

## 2017-07-06 DIAGNOSIS — L03116 Cellulitis of left lower limb: Secondary | ICD-10-CM | POA: Diagnosis not present

## 2017-07-06 DIAGNOSIS — S81802D Unspecified open wound, left lower leg, subsequent encounter: Secondary | ICD-10-CM | POA: Diagnosis not present

## 2017-07-06 DIAGNOSIS — G5702 Lesion of sciatic nerve, left lower limb: Secondary | ICD-10-CM | POA: Diagnosis not present

## 2017-07-06 DIAGNOSIS — M5417 Radiculopathy, lumbosacral region: Secondary | ICD-10-CM | POA: Diagnosis not present

## 2017-07-06 DIAGNOSIS — N186 End stage renal disease: Secondary | ICD-10-CM | POA: Diagnosis not present

## 2017-07-06 DIAGNOSIS — Z79899 Other long term (current) drug therapy: Secondary | ICD-10-CM | POA: Diagnosis not present

## 2017-07-06 DIAGNOSIS — M791 Myalgia: Secondary | ICD-10-CM | POA: Diagnosis not present

## 2017-07-06 DIAGNOSIS — Z5181 Encounter for therapeutic drug level monitoring: Secondary | ICD-10-CM | POA: Diagnosis not present

## 2017-07-08 DIAGNOSIS — N186 End stage renal disease: Secondary | ICD-10-CM | POA: Diagnosis not present

## 2017-07-08 DIAGNOSIS — N2581 Secondary hyperparathyroidism of renal origin: Secondary | ICD-10-CM | POA: Diagnosis not present

## 2017-07-08 DIAGNOSIS — D631 Anemia in chronic kidney disease: Secondary | ICD-10-CM | POA: Diagnosis not present

## 2017-07-08 DIAGNOSIS — M65062 Abscess of tendon sheath, left lower leg: Secondary | ICD-10-CM | POA: Diagnosis not present

## 2017-07-11 DIAGNOSIS — L02818 Cutaneous abscess of other sites: Secondary | ICD-10-CM | POA: Diagnosis not present

## 2017-07-11 DIAGNOSIS — M65062 Abscess of tendon sheath, left lower leg: Secondary | ICD-10-CM | POA: Diagnosis not present

## 2017-07-11 DIAGNOSIS — D631 Anemia in chronic kidney disease: Secondary | ICD-10-CM | POA: Diagnosis not present

## 2017-07-11 DIAGNOSIS — R599 Enlarged lymph nodes, unspecified: Secondary | ICD-10-CM | POA: Diagnosis not present

## 2017-07-11 DIAGNOSIS — N2581 Secondary hyperparathyroidism of renal origin: Secondary | ICD-10-CM | POA: Diagnosis not present

## 2017-07-11 DIAGNOSIS — N186 End stage renal disease: Secondary | ICD-10-CM | POA: Diagnosis not present

## 2017-07-13 DIAGNOSIS — D631 Anemia in chronic kidney disease: Secondary | ICD-10-CM | POA: Diagnosis not present

## 2017-07-13 DIAGNOSIS — N186 End stage renal disease: Secondary | ICD-10-CM | POA: Diagnosis not present

## 2017-07-13 DIAGNOSIS — N2581 Secondary hyperparathyroidism of renal origin: Secondary | ICD-10-CM | POA: Diagnosis not present

## 2017-07-13 DIAGNOSIS — M65062 Abscess of tendon sheath, left lower leg: Secondary | ICD-10-CM | POA: Diagnosis not present

## 2017-07-15 DIAGNOSIS — N2581 Secondary hyperparathyroidism of renal origin: Secondary | ICD-10-CM | POA: Diagnosis not present

## 2017-07-15 DIAGNOSIS — N186 End stage renal disease: Secondary | ICD-10-CM | POA: Diagnosis not present

## 2017-07-15 DIAGNOSIS — M65062 Abscess of tendon sheath, left lower leg: Secondary | ICD-10-CM | POA: Diagnosis not present

## 2017-07-15 DIAGNOSIS — D631 Anemia in chronic kidney disease: Secondary | ICD-10-CM | POA: Diagnosis not present

## 2017-07-15 DIAGNOSIS — L03116 Cellulitis of left lower limb: Secondary | ICD-10-CM | POA: Diagnosis not present

## 2017-07-18 DIAGNOSIS — N186 End stage renal disease: Secondary | ICD-10-CM | POA: Diagnosis not present

## 2017-07-18 DIAGNOSIS — D631 Anemia in chronic kidney disease: Secondary | ICD-10-CM | POA: Diagnosis not present

## 2017-07-18 DIAGNOSIS — M65062 Abscess of tendon sheath, left lower leg: Secondary | ICD-10-CM | POA: Diagnosis not present

## 2017-07-18 DIAGNOSIS — N2581 Secondary hyperparathyroidism of renal origin: Secondary | ICD-10-CM | POA: Diagnosis not present

## 2017-07-21 DIAGNOSIS — M65062 Abscess of tendon sheath, left lower leg: Secondary | ICD-10-CM | POA: Diagnosis not present

## 2017-07-21 DIAGNOSIS — D631 Anemia in chronic kidney disease: Secondary | ICD-10-CM | POA: Diagnosis not present

## 2017-07-21 DIAGNOSIS — N186 End stage renal disease: Secondary | ICD-10-CM | POA: Diagnosis not present

## 2017-07-21 DIAGNOSIS — N2581 Secondary hyperparathyroidism of renal origin: Secondary | ICD-10-CM | POA: Diagnosis not present

## 2017-07-22 DIAGNOSIS — D631 Anemia in chronic kidney disease: Secondary | ICD-10-CM | POA: Diagnosis not present

## 2017-07-22 DIAGNOSIS — M65062 Abscess of tendon sheath, left lower leg: Secondary | ICD-10-CM | POA: Diagnosis not present

## 2017-07-22 DIAGNOSIS — N2581 Secondary hyperparathyroidism of renal origin: Secondary | ICD-10-CM | POA: Diagnosis not present

## 2017-07-22 DIAGNOSIS — N186 End stage renal disease: Secondary | ICD-10-CM | POA: Diagnosis not present

## 2017-07-23 DIAGNOSIS — J45909 Unspecified asthma, uncomplicated: Secondary | ICD-10-CM | POA: Diagnosis not present

## 2017-07-23 DIAGNOSIS — N186 End stage renal disease: Secondary | ICD-10-CM | POA: Diagnosis not present

## 2017-07-23 DIAGNOSIS — Z4800 Encounter for change or removal of nonsurgical wound dressing: Secondary | ICD-10-CM | POA: Diagnosis not present

## 2017-07-23 DIAGNOSIS — F17210 Nicotine dependence, cigarettes, uncomplicated: Secondary | ICD-10-CM | POA: Diagnosis not present

## 2017-07-23 DIAGNOSIS — M109 Gout, unspecified: Secondary | ICD-10-CM | POA: Diagnosis not present

## 2017-07-23 DIAGNOSIS — L03116 Cellulitis of left lower limb: Secondary | ICD-10-CM | POA: Diagnosis not present

## 2017-07-23 DIAGNOSIS — S80922D Unspecified superficial injury of left lower leg, subsequent encounter: Secondary | ICD-10-CM | POA: Diagnosis not present

## 2017-07-23 DIAGNOSIS — F329 Major depressive disorder, single episode, unspecified: Secondary | ICD-10-CM | POA: Diagnosis not present

## 2017-07-23 DIAGNOSIS — I120 Hypertensive chronic kidney disease with stage 5 chronic kidney disease or end stage renal disease: Secondary | ICD-10-CM | POA: Diagnosis not present

## 2017-07-23 DIAGNOSIS — Z79891 Long term (current) use of opiate analgesic: Secondary | ICD-10-CM | POA: Diagnosis not present

## 2017-07-23 DIAGNOSIS — G4733 Obstructive sleep apnea (adult) (pediatric): Secondary | ICD-10-CM | POA: Diagnosis not present

## 2017-07-23 DIAGNOSIS — Z992 Dependence on renal dialysis: Secondary | ICD-10-CM | POA: Diagnosis not present

## 2017-07-23 DIAGNOSIS — W19XXXD Unspecified fall, subsequent encounter: Secondary | ICD-10-CM | POA: Diagnosis not present

## 2017-07-23 DIAGNOSIS — F419 Anxiety disorder, unspecified: Secondary | ICD-10-CM | POA: Diagnosis not present

## 2017-07-23 DIAGNOSIS — K219 Gastro-esophageal reflux disease without esophagitis: Secondary | ICD-10-CM | POA: Diagnosis not present

## 2017-07-26 DIAGNOSIS — G4733 Obstructive sleep apnea (adult) (pediatric): Secondary | ICD-10-CM | POA: Diagnosis not present

## 2017-07-26 DIAGNOSIS — S80922D Unspecified superficial injury of left lower leg, subsequent encounter: Secondary | ICD-10-CM | POA: Diagnosis not present

## 2017-07-26 DIAGNOSIS — J45909 Unspecified asthma, uncomplicated: Secondary | ICD-10-CM | POA: Diagnosis not present

## 2017-07-26 DIAGNOSIS — L03116 Cellulitis of left lower limb: Secondary | ICD-10-CM | POA: Diagnosis not present

## 2017-07-26 DIAGNOSIS — N186 End stage renal disease: Secondary | ICD-10-CM | POA: Diagnosis not present

## 2017-07-26 DIAGNOSIS — I120 Hypertensive chronic kidney disease with stage 5 chronic kidney disease or end stage renal disease: Secondary | ICD-10-CM | POA: Diagnosis not present

## 2017-07-27 DIAGNOSIS — L03116 Cellulitis of left lower limb: Secondary | ICD-10-CM | POA: Diagnosis not present

## 2017-07-27 DIAGNOSIS — D631 Anemia in chronic kidney disease: Secondary | ICD-10-CM | POA: Diagnosis not present

## 2017-07-27 DIAGNOSIS — N186 End stage renal disease: Secondary | ICD-10-CM | POA: Diagnosis not present

## 2017-07-27 DIAGNOSIS — I120 Hypertensive chronic kidney disease with stage 5 chronic kidney disease or end stage renal disease: Secondary | ICD-10-CM | POA: Diagnosis not present

## 2017-07-27 DIAGNOSIS — J45909 Unspecified asthma, uncomplicated: Secondary | ICD-10-CM | POA: Diagnosis not present

## 2017-07-27 DIAGNOSIS — N2581 Secondary hyperparathyroidism of renal origin: Secondary | ICD-10-CM | POA: Diagnosis not present

## 2017-07-27 DIAGNOSIS — M65062 Abscess of tendon sheath, left lower leg: Secondary | ICD-10-CM | POA: Diagnosis not present

## 2017-07-27 DIAGNOSIS — G4733 Obstructive sleep apnea (adult) (pediatric): Secondary | ICD-10-CM | POA: Diagnosis not present

## 2017-07-27 DIAGNOSIS — S80922D Unspecified superficial injury of left lower leg, subsequent encounter: Secondary | ICD-10-CM | POA: Diagnosis not present

## 2017-07-28 DIAGNOSIS — L03116 Cellulitis of left lower limb: Secondary | ICD-10-CM | POA: Diagnosis not present

## 2017-07-28 DIAGNOSIS — N186 End stage renal disease: Secondary | ICD-10-CM | POA: Diagnosis not present

## 2017-07-28 DIAGNOSIS — S80922D Unspecified superficial injury of left lower leg, subsequent encounter: Secondary | ICD-10-CM | POA: Diagnosis not present

## 2017-07-28 DIAGNOSIS — I120 Hypertensive chronic kidney disease with stage 5 chronic kidney disease or end stage renal disease: Secondary | ICD-10-CM | POA: Diagnosis not present

## 2017-07-28 DIAGNOSIS — J45909 Unspecified asthma, uncomplicated: Secondary | ICD-10-CM | POA: Diagnosis not present

## 2017-07-28 DIAGNOSIS — G4733 Obstructive sleep apnea (adult) (pediatric): Secondary | ICD-10-CM | POA: Diagnosis not present

## 2017-07-29 DIAGNOSIS — D631 Anemia in chronic kidney disease: Secondary | ICD-10-CM | POA: Diagnosis not present

## 2017-07-29 DIAGNOSIS — M65062 Abscess of tendon sheath, left lower leg: Secondary | ICD-10-CM | POA: Diagnosis not present

## 2017-07-29 DIAGNOSIS — N2581 Secondary hyperparathyroidism of renal origin: Secondary | ICD-10-CM | POA: Diagnosis not present

## 2017-07-29 DIAGNOSIS — N186 End stage renal disease: Secondary | ICD-10-CM | POA: Diagnosis not present

## 2017-07-31 DIAGNOSIS — Z992 Dependence on renal dialysis: Secondary | ICD-10-CM | POA: Diagnosis not present

## 2017-07-31 DIAGNOSIS — N186 End stage renal disease: Secondary | ICD-10-CM | POA: Diagnosis not present

## 2017-07-31 DIAGNOSIS — I120 Hypertensive chronic kidney disease with stage 5 chronic kidney disease or end stage renal disease: Secondary | ICD-10-CM | POA: Diagnosis not present

## 2017-08-01 DIAGNOSIS — L03116 Cellulitis of left lower limb: Secondary | ICD-10-CM | POA: Diagnosis not present

## 2017-08-01 DIAGNOSIS — S80922D Unspecified superficial injury of left lower leg, subsequent encounter: Secondary | ICD-10-CM | POA: Diagnosis not present

## 2017-08-01 DIAGNOSIS — N2581 Secondary hyperparathyroidism of renal origin: Secondary | ICD-10-CM | POA: Diagnosis not present

## 2017-08-01 DIAGNOSIS — N186 End stage renal disease: Secondary | ICD-10-CM | POA: Diagnosis not present

## 2017-08-01 DIAGNOSIS — D631 Anemia in chronic kidney disease: Secondary | ICD-10-CM | POA: Diagnosis not present

## 2017-08-01 DIAGNOSIS — I120 Hypertensive chronic kidney disease with stage 5 chronic kidney disease or end stage renal disease: Secondary | ICD-10-CM | POA: Diagnosis not present

## 2017-08-01 DIAGNOSIS — Z23 Encounter for immunization: Secondary | ICD-10-CM | POA: Diagnosis not present

## 2017-08-02 DIAGNOSIS — I120 Hypertensive chronic kidney disease with stage 5 chronic kidney disease or end stage renal disease: Secondary | ICD-10-CM | POA: Diagnosis not present

## 2017-08-02 DIAGNOSIS — L03116 Cellulitis of left lower limb: Secondary | ICD-10-CM | POA: Diagnosis not present

## 2017-08-02 DIAGNOSIS — G4733 Obstructive sleep apnea (adult) (pediatric): Secondary | ICD-10-CM | POA: Diagnosis not present

## 2017-08-02 DIAGNOSIS — S80922D Unspecified superficial injury of left lower leg, subsequent encounter: Secondary | ICD-10-CM | POA: Diagnosis not present

## 2017-08-02 DIAGNOSIS — N186 End stage renal disease: Secondary | ICD-10-CM | POA: Diagnosis not present

## 2017-08-02 DIAGNOSIS — J45909 Unspecified asthma, uncomplicated: Secondary | ICD-10-CM | POA: Diagnosis not present

## 2017-08-03 DIAGNOSIS — Z23 Encounter for immunization: Secondary | ICD-10-CM | POA: Diagnosis not present

## 2017-08-03 DIAGNOSIS — N2581 Secondary hyperparathyroidism of renal origin: Secondary | ICD-10-CM | POA: Diagnosis not present

## 2017-08-03 DIAGNOSIS — N186 End stage renal disease: Secondary | ICD-10-CM | POA: Diagnosis not present

## 2017-08-03 DIAGNOSIS — D631 Anemia in chronic kidney disease: Secondary | ICD-10-CM | POA: Diagnosis not present

## 2017-08-04 DIAGNOSIS — G4733 Obstructive sleep apnea (adult) (pediatric): Secondary | ICD-10-CM | POA: Diagnosis not present

## 2017-08-04 DIAGNOSIS — S80922D Unspecified superficial injury of left lower leg, subsequent encounter: Secondary | ICD-10-CM | POA: Diagnosis not present

## 2017-08-04 DIAGNOSIS — N186 End stage renal disease: Secondary | ICD-10-CM | POA: Diagnosis not present

## 2017-08-04 DIAGNOSIS — J45909 Unspecified asthma, uncomplicated: Secondary | ICD-10-CM | POA: Diagnosis not present

## 2017-08-04 DIAGNOSIS — L03116 Cellulitis of left lower limb: Secondary | ICD-10-CM | POA: Diagnosis not present

## 2017-08-04 DIAGNOSIS — I120 Hypertensive chronic kidney disease with stage 5 chronic kidney disease or end stage renal disease: Secondary | ICD-10-CM | POA: Diagnosis not present

## 2017-08-05 DIAGNOSIS — Z23 Encounter for immunization: Secondary | ICD-10-CM | POA: Diagnosis not present

## 2017-08-05 DIAGNOSIS — N2581 Secondary hyperparathyroidism of renal origin: Secondary | ICD-10-CM | POA: Diagnosis not present

## 2017-08-05 DIAGNOSIS — D631 Anemia in chronic kidney disease: Secondary | ICD-10-CM | POA: Diagnosis not present

## 2017-08-05 DIAGNOSIS — N186 End stage renal disease: Secondary | ICD-10-CM | POA: Diagnosis not present

## 2017-08-08 DIAGNOSIS — N186 End stage renal disease: Secondary | ICD-10-CM | POA: Diagnosis not present

## 2017-08-08 DIAGNOSIS — Z23 Encounter for immunization: Secondary | ICD-10-CM | POA: Diagnosis not present

## 2017-08-08 DIAGNOSIS — N2581 Secondary hyperparathyroidism of renal origin: Secondary | ICD-10-CM | POA: Diagnosis not present

## 2017-08-08 DIAGNOSIS — D631 Anemia in chronic kidney disease: Secondary | ICD-10-CM | POA: Diagnosis not present

## 2017-08-09 DIAGNOSIS — L03116 Cellulitis of left lower limb: Secondary | ICD-10-CM | POA: Diagnosis not present

## 2017-08-09 DIAGNOSIS — J45909 Unspecified asthma, uncomplicated: Secondary | ICD-10-CM | POA: Diagnosis not present

## 2017-08-09 DIAGNOSIS — S80922D Unspecified superficial injury of left lower leg, subsequent encounter: Secondary | ICD-10-CM | POA: Diagnosis not present

## 2017-08-09 DIAGNOSIS — N186 End stage renal disease: Secondary | ICD-10-CM | POA: Diagnosis not present

## 2017-08-09 DIAGNOSIS — G4733 Obstructive sleep apnea (adult) (pediatric): Secondary | ICD-10-CM | POA: Diagnosis not present

## 2017-08-09 DIAGNOSIS — I120 Hypertensive chronic kidney disease with stage 5 chronic kidney disease or end stage renal disease: Secondary | ICD-10-CM | POA: Diagnosis not present

## 2017-08-11 DIAGNOSIS — D631 Anemia in chronic kidney disease: Secondary | ICD-10-CM | POA: Diagnosis not present

## 2017-08-11 DIAGNOSIS — N2581 Secondary hyperparathyroidism of renal origin: Secondary | ICD-10-CM | POA: Diagnosis not present

## 2017-08-11 DIAGNOSIS — N186 End stage renal disease: Secondary | ICD-10-CM | POA: Diagnosis not present

## 2017-08-11 DIAGNOSIS — Z23 Encounter for immunization: Secondary | ICD-10-CM | POA: Diagnosis not present

## 2017-08-12 DIAGNOSIS — N186 End stage renal disease: Secondary | ICD-10-CM | POA: Diagnosis not present

## 2017-08-12 DIAGNOSIS — N2581 Secondary hyperparathyroidism of renal origin: Secondary | ICD-10-CM | POA: Diagnosis not present

## 2017-08-12 DIAGNOSIS — Z23 Encounter for immunization: Secondary | ICD-10-CM | POA: Diagnosis not present

## 2017-08-12 DIAGNOSIS — D631 Anemia in chronic kidney disease: Secondary | ICD-10-CM | POA: Diagnosis not present

## 2017-08-15 DIAGNOSIS — D631 Anemia in chronic kidney disease: Secondary | ICD-10-CM | POA: Diagnosis not present

## 2017-08-15 DIAGNOSIS — N186 End stage renal disease: Secondary | ICD-10-CM | POA: Diagnosis not present

## 2017-08-15 DIAGNOSIS — Z23 Encounter for immunization: Secondary | ICD-10-CM | POA: Diagnosis not present

## 2017-08-15 DIAGNOSIS — N2581 Secondary hyperparathyroidism of renal origin: Secondary | ICD-10-CM | POA: Diagnosis not present

## 2017-08-16 DIAGNOSIS — J45909 Unspecified asthma, uncomplicated: Secondary | ICD-10-CM | POA: Diagnosis not present

## 2017-08-16 DIAGNOSIS — S80922D Unspecified superficial injury of left lower leg, subsequent encounter: Secondary | ICD-10-CM | POA: Diagnosis not present

## 2017-08-16 DIAGNOSIS — I120 Hypertensive chronic kidney disease with stage 5 chronic kidney disease or end stage renal disease: Secondary | ICD-10-CM | POA: Diagnosis not present

## 2017-08-16 DIAGNOSIS — N186 End stage renal disease: Secondary | ICD-10-CM | POA: Diagnosis not present

## 2017-08-16 DIAGNOSIS — L03116 Cellulitis of left lower limb: Secondary | ICD-10-CM | POA: Diagnosis not present

## 2017-08-16 DIAGNOSIS — G4733 Obstructive sleep apnea (adult) (pediatric): Secondary | ICD-10-CM | POA: Diagnosis not present

## 2017-08-17 DIAGNOSIS — Z23 Encounter for immunization: Secondary | ICD-10-CM | POA: Diagnosis not present

## 2017-08-17 DIAGNOSIS — D631 Anemia in chronic kidney disease: Secondary | ICD-10-CM | POA: Diagnosis not present

## 2017-08-17 DIAGNOSIS — N2581 Secondary hyperparathyroidism of renal origin: Secondary | ICD-10-CM | POA: Diagnosis not present

## 2017-08-17 DIAGNOSIS — N186 End stage renal disease: Secondary | ICD-10-CM | POA: Diagnosis not present

## 2017-08-19 DIAGNOSIS — N2581 Secondary hyperparathyroidism of renal origin: Secondary | ICD-10-CM | POA: Diagnosis not present

## 2017-08-19 DIAGNOSIS — D631 Anemia in chronic kidney disease: Secondary | ICD-10-CM | POA: Diagnosis not present

## 2017-08-19 DIAGNOSIS — N186 End stage renal disease: Secondary | ICD-10-CM | POA: Diagnosis not present

## 2017-08-19 DIAGNOSIS — Z23 Encounter for immunization: Secondary | ICD-10-CM | POA: Diagnosis not present

## 2017-08-25 DIAGNOSIS — L03116 Cellulitis of left lower limb: Secondary | ICD-10-CM | POA: Diagnosis not present

## 2017-08-25 DIAGNOSIS — I120 Hypertensive chronic kidney disease with stage 5 chronic kidney disease or end stage renal disease: Secondary | ICD-10-CM | POA: Diagnosis not present

## 2017-08-25 DIAGNOSIS — G4733 Obstructive sleep apnea (adult) (pediatric): Secondary | ICD-10-CM | POA: Diagnosis not present

## 2017-08-25 DIAGNOSIS — S80922D Unspecified superficial injury of left lower leg, subsequent encounter: Secondary | ICD-10-CM | POA: Diagnosis not present

## 2017-08-25 DIAGNOSIS — N186 End stage renal disease: Secondary | ICD-10-CM | POA: Diagnosis not present

## 2017-08-25 DIAGNOSIS — J45909 Unspecified asthma, uncomplicated: Secondary | ICD-10-CM | POA: Diagnosis not present

## 2017-08-26 DIAGNOSIS — N2581 Secondary hyperparathyroidism of renal origin: Secondary | ICD-10-CM | POA: Diagnosis not present

## 2017-08-26 DIAGNOSIS — D631 Anemia in chronic kidney disease: Secondary | ICD-10-CM | POA: Diagnosis not present

## 2017-08-26 DIAGNOSIS — E119 Type 2 diabetes mellitus without complications: Secondary | ICD-10-CM | POA: Diagnosis not present

## 2017-08-26 DIAGNOSIS — Z23 Encounter for immunization: Secondary | ICD-10-CM | POA: Diagnosis not present

## 2017-08-26 DIAGNOSIS — N186 End stage renal disease: Secondary | ICD-10-CM | POA: Diagnosis not present

## 2017-08-29 DIAGNOSIS — N186 End stage renal disease: Secondary | ICD-10-CM | POA: Diagnosis not present

## 2017-08-29 DIAGNOSIS — D631 Anemia in chronic kidney disease: Secondary | ICD-10-CM | POA: Diagnosis not present

## 2017-08-29 DIAGNOSIS — Z23 Encounter for immunization: Secondary | ICD-10-CM | POA: Diagnosis not present

## 2017-08-29 DIAGNOSIS — N2581 Secondary hyperparathyroidism of renal origin: Secondary | ICD-10-CM | POA: Diagnosis not present

## 2017-08-30 DIAGNOSIS — J45909 Unspecified asthma, uncomplicated: Secondary | ICD-10-CM | POA: Diagnosis not present

## 2017-08-30 DIAGNOSIS — L03116 Cellulitis of left lower limb: Secondary | ICD-10-CM | POA: Diagnosis not present

## 2017-08-30 DIAGNOSIS — I120 Hypertensive chronic kidney disease with stage 5 chronic kidney disease or end stage renal disease: Secondary | ICD-10-CM | POA: Diagnosis not present

## 2017-08-30 DIAGNOSIS — N186 End stage renal disease: Secondary | ICD-10-CM | POA: Diagnosis not present

## 2017-08-30 DIAGNOSIS — G4733 Obstructive sleep apnea (adult) (pediatric): Secondary | ICD-10-CM | POA: Diagnosis not present

## 2017-08-30 DIAGNOSIS — S80922D Unspecified superficial injury of left lower leg, subsequent encounter: Secondary | ICD-10-CM | POA: Diagnosis not present

## 2017-08-31 DIAGNOSIS — N2581 Secondary hyperparathyroidism of renal origin: Secondary | ICD-10-CM | POA: Diagnosis not present

## 2017-08-31 DIAGNOSIS — Z23 Encounter for immunization: Secondary | ICD-10-CM | POA: Diagnosis not present

## 2017-08-31 DIAGNOSIS — N033 Chronic nephritic syndrome with diffuse mesangial proliferative glomerulonephritis: Secondary | ICD-10-CM | POA: Diagnosis not present

## 2017-08-31 DIAGNOSIS — D631 Anemia in chronic kidney disease: Secondary | ICD-10-CM | POA: Diagnosis not present

## 2017-08-31 DIAGNOSIS — Z992 Dependence on renal dialysis: Secondary | ICD-10-CM | POA: Diagnosis not present

## 2017-08-31 DIAGNOSIS — N186 End stage renal disease: Secondary | ICD-10-CM | POA: Diagnosis not present

## 2017-09-01 DIAGNOSIS — M5136 Other intervertebral disc degeneration, lumbar region: Secondary | ICD-10-CM | POA: Diagnosis not present

## 2017-09-01 DIAGNOSIS — M791 Myalgia, unspecified site: Secondary | ICD-10-CM | POA: Diagnosis not present

## 2017-09-01 DIAGNOSIS — M549 Dorsalgia, unspecified: Secondary | ICD-10-CM | POA: Diagnosis not present

## 2017-09-01 DIAGNOSIS — M5417 Radiculopathy, lumbosacral region: Secondary | ICD-10-CM | POA: Diagnosis not present

## 2017-09-01 DIAGNOSIS — G5702 Lesion of sciatic nerve, left lower limb: Secondary | ICD-10-CM | POA: Diagnosis not present

## 2017-09-02 DIAGNOSIS — N186 End stage renal disease: Secondary | ICD-10-CM | POA: Diagnosis not present

## 2017-09-02 DIAGNOSIS — N2581 Secondary hyperparathyroidism of renal origin: Secondary | ICD-10-CM | POA: Diagnosis not present

## 2017-09-02 DIAGNOSIS — D631 Anemia in chronic kidney disease: Secondary | ICD-10-CM | POA: Diagnosis not present

## 2017-09-05 DIAGNOSIS — N2581 Secondary hyperparathyroidism of renal origin: Secondary | ICD-10-CM | POA: Diagnosis not present

## 2017-09-05 DIAGNOSIS — D631 Anemia in chronic kidney disease: Secondary | ICD-10-CM | POA: Diagnosis not present

## 2017-09-05 DIAGNOSIS — N186 End stage renal disease: Secondary | ICD-10-CM | POA: Diagnosis not present

## 2017-09-06 DIAGNOSIS — S80922D Unspecified superficial injury of left lower leg, subsequent encounter: Secondary | ICD-10-CM | POA: Diagnosis not present

## 2017-09-06 DIAGNOSIS — G4733 Obstructive sleep apnea (adult) (pediatric): Secondary | ICD-10-CM | POA: Diagnosis not present

## 2017-09-06 DIAGNOSIS — I120 Hypertensive chronic kidney disease with stage 5 chronic kidney disease or end stage renal disease: Secondary | ICD-10-CM | POA: Diagnosis not present

## 2017-09-06 DIAGNOSIS — J45909 Unspecified asthma, uncomplicated: Secondary | ICD-10-CM | POA: Diagnosis not present

## 2017-09-06 DIAGNOSIS — L03116 Cellulitis of left lower limb: Secondary | ICD-10-CM | POA: Diagnosis not present

## 2017-09-06 DIAGNOSIS — N186 End stage renal disease: Secondary | ICD-10-CM | POA: Diagnosis not present

## 2017-09-07 DIAGNOSIS — N2581 Secondary hyperparathyroidism of renal origin: Secondary | ICD-10-CM | POA: Diagnosis not present

## 2017-09-07 DIAGNOSIS — N186 End stage renal disease: Secondary | ICD-10-CM | POA: Diagnosis not present

## 2017-09-07 DIAGNOSIS — D631 Anemia in chronic kidney disease: Secondary | ICD-10-CM | POA: Diagnosis not present

## 2017-09-09 DIAGNOSIS — D631 Anemia in chronic kidney disease: Secondary | ICD-10-CM | POA: Diagnosis not present

## 2017-09-09 DIAGNOSIS — N2581 Secondary hyperparathyroidism of renal origin: Secondary | ICD-10-CM | POA: Diagnosis not present

## 2017-09-09 DIAGNOSIS — N186 End stage renal disease: Secondary | ICD-10-CM | POA: Diagnosis not present

## 2017-09-12 DIAGNOSIS — N186 End stage renal disease: Secondary | ICD-10-CM | POA: Diagnosis not present

## 2017-09-12 DIAGNOSIS — D631 Anemia in chronic kidney disease: Secondary | ICD-10-CM | POA: Diagnosis not present

## 2017-09-12 DIAGNOSIS — N2581 Secondary hyperparathyroidism of renal origin: Secondary | ICD-10-CM | POA: Diagnosis not present

## 2017-09-13 DIAGNOSIS — S80922D Unspecified superficial injury of left lower leg, subsequent encounter: Secondary | ICD-10-CM | POA: Diagnosis not present

## 2017-09-13 DIAGNOSIS — L03116 Cellulitis of left lower limb: Secondary | ICD-10-CM | POA: Diagnosis not present

## 2017-09-13 DIAGNOSIS — N186 End stage renal disease: Secondary | ICD-10-CM | POA: Diagnosis not present

## 2017-09-13 DIAGNOSIS — I120 Hypertensive chronic kidney disease with stage 5 chronic kidney disease or end stage renal disease: Secondary | ICD-10-CM | POA: Diagnosis not present

## 2017-09-13 DIAGNOSIS — G4733 Obstructive sleep apnea (adult) (pediatric): Secondary | ICD-10-CM | POA: Diagnosis not present

## 2017-09-13 DIAGNOSIS — J45909 Unspecified asthma, uncomplicated: Secondary | ICD-10-CM | POA: Diagnosis not present

## 2017-09-14 DIAGNOSIS — N2581 Secondary hyperparathyroidism of renal origin: Secondary | ICD-10-CM | POA: Diagnosis not present

## 2017-09-14 DIAGNOSIS — N186 End stage renal disease: Secondary | ICD-10-CM | POA: Diagnosis not present

## 2017-09-14 DIAGNOSIS — D631 Anemia in chronic kidney disease: Secondary | ICD-10-CM | POA: Diagnosis not present

## 2017-09-14 DIAGNOSIS — L03116 Cellulitis of left lower limb: Secondary | ICD-10-CM | POA: Diagnosis not present

## 2017-09-16 DIAGNOSIS — D631 Anemia in chronic kidney disease: Secondary | ICD-10-CM | POA: Diagnosis not present

## 2017-09-16 DIAGNOSIS — N2581 Secondary hyperparathyroidism of renal origin: Secondary | ICD-10-CM | POA: Diagnosis not present

## 2017-09-16 DIAGNOSIS — N186 End stage renal disease: Secondary | ICD-10-CM | POA: Diagnosis not present

## 2017-09-21 DIAGNOSIS — N2581 Secondary hyperparathyroidism of renal origin: Secondary | ICD-10-CM | POA: Diagnosis not present

## 2017-09-21 DIAGNOSIS — D631 Anemia in chronic kidney disease: Secondary | ICD-10-CM | POA: Diagnosis not present

## 2017-09-21 DIAGNOSIS — N186 End stage renal disease: Secondary | ICD-10-CM | POA: Diagnosis not present

## 2017-09-23 DIAGNOSIS — D631 Anemia in chronic kidney disease: Secondary | ICD-10-CM | POA: Diagnosis not present

## 2017-09-23 DIAGNOSIS — N2581 Secondary hyperparathyroidism of renal origin: Secondary | ICD-10-CM | POA: Diagnosis not present

## 2017-09-23 DIAGNOSIS — N186 End stage renal disease: Secondary | ICD-10-CM | POA: Diagnosis not present

## 2017-09-26 DIAGNOSIS — N186 End stage renal disease: Secondary | ICD-10-CM | POA: Diagnosis not present

## 2017-09-26 DIAGNOSIS — D631 Anemia in chronic kidney disease: Secondary | ICD-10-CM | POA: Diagnosis not present

## 2017-09-26 DIAGNOSIS — N2581 Secondary hyperparathyroidism of renal origin: Secondary | ICD-10-CM | POA: Diagnosis not present

## 2017-09-28 DIAGNOSIS — N186 End stage renal disease: Secondary | ICD-10-CM | POA: Diagnosis not present

## 2017-09-28 DIAGNOSIS — D631 Anemia in chronic kidney disease: Secondary | ICD-10-CM | POA: Diagnosis not present

## 2017-09-28 DIAGNOSIS — N2581 Secondary hyperparathyroidism of renal origin: Secondary | ICD-10-CM | POA: Diagnosis not present

## 2017-09-30 DIAGNOSIS — Z992 Dependence on renal dialysis: Secondary | ICD-10-CM | POA: Diagnosis not present

## 2017-09-30 DIAGNOSIS — N186 End stage renal disease: Secondary | ICD-10-CM | POA: Diagnosis not present

## 2017-09-30 DIAGNOSIS — N033 Chronic nephritic syndrome with diffuse mesangial proliferative glomerulonephritis: Secondary | ICD-10-CM | POA: Diagnosis not present

## 2017-09-30 DIAGNOSIS — D631 Anemia in chronic kidney disease: Secondary | ICD-10-CM | POA: Diagnosis not present

## 2017-09-30 DIAGNOSIS — N2581 Secondary hyperparathyroidism of renal origin: Secondary | ICD-10-CM | POA: Diagnosis not present

## 2017-10-03 DIAGNOSIS — N2581 Secondary hyperparathyroidism of renal origin: Secondary | ICD-10-CM | POA: Diagnosis not present

## 2017-10-03 DIAGNOSIS — N186 End stage renal disease: Secondary | ICD-10-CM | POA: Diagnosis not present

## 2017-10-03 DIAGNOSIS — D509 Iron deficiency anemia, unspecified: Secondary | ICD-10-CM | POA: Diagnosis not present

## 2017-10-03 DIAGNOSIS — D631 Anemia in chronic kidney disease: Secondary | ICD-10-CM | POA: Diagnosis not present

## 2017-10-05 DIAGNOSIS — N2581 Secondary hyperparathyroidism of renal origin: Secondary | ICD-10-CM | POA: Diagnosis not present

## 2017-10-05 DIAGNOSIS — D509 Iron deficiency anemia, unspecified: Secondary | ICD-10-CM | POA: Diagnosis not present

## 2017-10-05 DIAGNOSIS — N186 End stage renal disease: Secondary | ICD-10-CM | POA: Diagnosis not present

## 2017-10-05 DIAGNOSIS — D631 Anemia in chronic kidney disease: Secondary | ICD-10-CM | POA: Diagnosis not present

## 2017-10-06 DIAGNOSIS — Z79891 Long term (current) use of opiate analgesic: Secondary | ICD-10-CM | POA: Diagnosis not present

## 2017-10-06 DIAGNOSIS — M5136 Other intervertebral disc degeneration, lumbar region: Secondary | ICD-10-CM | POA: Diagnosis not present

## 2017-10-06 DIAGNOSIS — Z885 Allergy status to narcotic agent status: Secondary | ICD-10-CM | POA: Diagnosis not present

## 2017-10-06 DIAGNOSIS — G4733 Obstructive sleep apnea (adult) (pediatric): Secondary | ICD-10-CM | POA: Diagnosis not present

## 2017-10-06 DIAGNOSIS — Z886 Allergy status to analgesic agent status: Secondary | ICD-10-CM | POA: Diagnosis not present

## 2017-10-06 DIAGNOSIS — I120 Hypertensive chronic kidney disease with stage 5 chronic kidney disease or end stage renal disease: Secondary | ICD-10-CM | POA: Diagnosis not present

## 2017-10-06 DIAGNOSIS — J45909 Unspecified asthma, uncomplicated: Secondary | ICD-10-CM | POA: Diagnosis not present

## 2017-10-06 DIAGNOSIS — G5700 Lesion of sciatic nerve, unspecified lower limb: Secondary | ICD-10-CM | POA: Diagnosis not present

## 2017-10-06 DIAGNOSIS — M5412 Radiculopathy, cervical region: Secondary | ICD-10-CM | POA: Diagnosis not present

## 2017-10-06 DIAGNOSIS — Z87891 Personal history of nicotine dependence: Secondary | ICD-10-CM | POA: Diagnosis not present

## 2017-10-06 DIAGNOSIS — F329 Major depressive disorder, single episode, unspecified: Secondary | ICD-10-CM | POA: Diagnosis not present

## 2017-10-06 DIAGNOSIS — Z992 Dependence on renal dialysis: Secondary | ICD-10-CM | POA: Diagnosis not present

## 2017-10-06 DIAGNOSIS — Z794 Long term (current) use of insulin: Secondary | ICD-10-CM | POA: Diagnosis not present

## 2017-10-06 DIAGNOSIS — G894 Chronic pain syndrome: Secondary | ICD-10-CM | POA: Diagnosis not present

## 2017-10-06 DIAGNOSIS — N186 End stage renal disease: Secondary | ICD-10-CM | POA: Diagnosis not present

## 2017-10-06 DIAGNOSIS — M5417 Radiculopathy, lumbosacral region: Secondary | ICD-10-CM | POA: Diagnosis not present

## 2017-10-06 DIAGNOSIS — Z881 Allergy status to other antibiotic agents status: Secondary | ICD-10-CM | POA: Diagnosis not present

## 2017-10-06 DIAGNOSIS — Z79899 Other long term (current) drug therapy: Secondary | ICD-10-CM | POA: Diagnosis not present

## 2017-10-07 DIAGNOSIS — N2581 Secondary hyperparathyroidism of renal origin: Secondary | ICD-10-CM | POA: Diagnosis not present

## 2017-10-07 DIAGNOSIS — N186 End stage renal disease: Secondary | ICD-10-CM | POA: Diagnosis not present

## 2017-10-07 DIAGNOSIS — D631 Anemia in chronic kidney disease: Secondary | ICD-10-CM | POA: Diagnosis not present

## 2017-10-07 DIAGNOSIS — D509 Iron deficiency anemia, unspecified: Secondary | ICD-10-CM | POA: Diagnosis not present

## 2017-10-12 DIAGNOSIS — N2581 Secondary hyperparathyroidism of renal origin: Secondary | ICD-10-CM | POA: Diagnosis not present

## 2017-10-12 DIAGNOSIS — D631 Anemia in chronic kidney disease: Secondary | ICD-10-CM | POA: Diagnosis not present

## 2017-10-12 DIAGNOSIS — N186 End stage renal disease: Secondary | ICD-10-CM | POA: Diagnosis not present

## 2017-10-12 DIAGNOSIS — D509 Iron deficiency anemia, unspecified: Secondary | ICD-10-CM | POA: Diagnosis not present

## 2017-10-14 DIAGNOSIS — D509 Iron deficiency anemia, unspecified: Secondary | ICD-10-CM | POA: Diagnosis not present

## 2017-10-14 DIAGNOSIS — N186 End stage renal disease: Secondary | ICD-10-CM | POA: Diagnosis not present

## 2017-10-14 DIAGNOSIS — D631 Anemia in chronic kidney disease: Secondary | ICD-10-CM | POA: Diagnosis not present

## 2017-10-14 DIAGNOSIS — N2581 Secondary hyperparathyroidism of renal origin: Secondary | ICD-10-CM | POA: Diagnosis not present

## 2017-10-17 DIAGNOSIS — D631 Anemia in chronic kidney disease: Secondary | ICD-10-CM | POA: Diagnosis not present

## 2017-10-17 DIAGNOSIS — N186 End stage renal disease: Secondary | ICD-10-CM | POA: Diagnosis not present

## 2017-10-17 DIAGNOSIS — N2581 Secondary hyperparathyroidism of renal origin: Secondary | ICD-10-CM | POA: Diagnosis not present

## 2017-10-17 DIAGNOSIS — D509 Iron deficiency anemia, unspecified: Secondary | ICD-10-CM | POA: Diagnosis not present

## 2017-10-19 DIAGNOSIS — D631 Anemia in chronic kidney disease: Secondary | ICD-10-CM | POA: Diagnosis not present

## 2017-10-19 DIAGNOSIS — D509 Iron deficiency anemia, unspecified: Secondary | ICD-10-CM | POA: Diagnosis not present

## 2017-10-19 DIAGNOSIS — N186 End stage renal disease: Secondary | ICD-10-CM | POA: Diagnosis not present

## 2017-10-19 DIAGNOSIS — N2581 Secondary hyperparathyroidism of renal origin: Secondary | ICD-10-CM | POA: Diagnosis not present

## 2017-10-20 DIAGNOSIS — N186 End stage renal disease: Secondary | ICD-10-CM | POA: Diagnosis not present

## 2017-10-20 DIAGNOSIS — M549 Dorsalgia, unspecified: Secondary | ICD-10-CM | POA: Diagnosis not present

## 2017-10-20 DIAGNOSIS — M791 Myalgia, unspecified site: Secondary | ICD-10-CM | POA: Diagnosis not present

## 2017-10-20 DIAGNOSIS — M5417 Radiculopathy, lumbosacral region: Secondary | ICD-10-CM | POA: Diagnosis not present

## 2017-10-20 DIAGNOSIS — G5702 Lesion of sciatic nerve, left lower limb: Secondary | ICD-10-CM | POA: Diagnosis not present

## 2017-10-20 DIAGNOSIS — M5136 Other intervertebral disc degeneration, lumbar region: Secondary | ICD-10-CM | POA: Diagnosis not present

## 2017-10-21 DIAGNOSIS — D631 Anemia in chronic kidney disease: Secondary | ICD-10-CM | POA: Diagnosis not present

## 2017-10-21 DIAGNOSIS — D509 Iron deficiency anemia, unspecified: Secondary | ICD-10-CM | POA: Diagnosis not present

## 2017-10-21 DIAGNOSIS — N2581 Secondary hyperparathyroidism of renal origin: Secondary | ICD-10-CM | POA: Diagnosis not present

## 2017-10-21 DIAGNOSIS — N186 End stage renal disease: Secondary | ICD-10-CM | POA: Diagnosis not present

## 2017-10-23 DIAGNOSIS — N2581 Secondary hyperparathyroidism of renal origin: Secondary | ICD-10-CM | POA: Diagnosis not present

## 2017-10-23 DIAGNOSIS — D509 Iron deficiency anemia, unspecified: Secondary | ICD-10-CM | POA: Diagnosis not present

## 2017-10-23 DIAGNOSIS — N186 End stage renal disease: Secondary | ICD-10-CM | POA: Diagnosis not present

## 2017-10-23 DIAGNOSIS — D631 Anemia in chronic kidney disease: Secondary | ICD-10-CM | POA: Diagnosis not present

## 2017-10-29 DIAGNOSIS — N2581 Secondary hyperparathyroidism of renal origin: Secondary | ICD-10-CM | POA: Diagnosis not present

## 2017-10-29 DIAGNOSIS — D509 Iron deficiency anemia, unspecified: Secondary | ICD-10-CM | POA: Diagnosis not present

## 2017-10-29 DIAGNOSIS — D631 Anemia in chronic kidney disease: Secondary | ICD-10-CM | POA: Diagnosis not present

## 2017-10-29 DIAGNOSIS — N186 End stage renal disease: Secondary | ICD-10-CM | POA: Diagnosis not present

## 2017-10-30 DIAGNOSIS — D631 Anemia in chronic kidney disease: Secondary | ICD-10-CM | POA: Diagnosis not present

## 2017-10-30 DIAGNOSIS — N186 End stage renal disease: Secondary | ICD-10-CM | POA: Diagnosis not present

## 2017-10-30 DIAGNOSIS — D509 Iron deficiency anemia, unspecified: Secondary | ICD-10-CM | POA: Diagnosis not present

## 2017-10-30 DIAGNOSIS — N2581 Secondary hyperparathyroidism of renal origin: Secondary | ICD-10-CM | POA: Diagnosis not present

## 2017-10-31 DIAGNOSIS — N033 Chronic nephritic syndrome with diffuse mesangial proliferative glomerulonephritis: Secondary | ICD-10-CM | POA: Diagnosis not present

## 2017-10-31 DIAGNOSIS — Z992 Dependence on renal dialysis: Secondary | ICD-10-CM | POA: Diagnosis not present

## 2017-10-31 DIAGNOSIS — N186 End stage renal disease: Secondary | ICD-10-CM | POA: Diagnosis not present

## 2017-11-02 DIAGNOSIS — D631 Anemia in chronic kidney disease: Secondary | ICD-10-CM | POA: Diagnosis not present

## 2017-11-02 DIAGNOSIS — N186 End stage renal disease: Secondary | ICD-10-CM | POA: Diagnosis not present

## 2017-11-02 DIAGNOSIS — N2581 Secondary hyperparathyroidism of renal origin: Secondary | ICD-10-CM | POA: Diagnosis not present

## 2017-11-02 DIAGNOSIS — D509 Iron deficiency anemia, unspecified: Secondary | ICD-10-CM | POA: Diagnosis not present

## 2017-11-04 DIAGNOSIS — N186 End stage renal disease: Secondary | ICD-10-CM | POA: Diagnosis not present

## 2017-11-04 DIAGNOSIS — D631 Anemia in chronic kidney disease: Secondary | ICD-10-CM | POA: Diagnosis not present

## 2017-11-04 DIAGNOSIS — D509 Iron deficiency anemia, unspecified: Secondary | ICD-10-CM | POA: Diagnosis not present

## 2017-11-04 DIAGNOSIS — N2581 Secondary hyperparathyroidism of renal origin: Secondary | ICD-10-CM | POA: Diagnosis not present

## 2017-11-07 DIAGNOSIS — D631 Anemia in chronic kidney disease: Secondary | ICD-10-CM | POA: Diagnosis not present

## 2017-11-07 DIAGNOSIS — N186 End stage renal disease: Secondary | ICD-10-CM | POA: Diagnosis not present

## 2017-11-07 DIAGNOSIS — N2581 Secondary hyperparathyroidism of renal origin: Secondary | ICD-10-CM | POA: Diagnosis not present

## 2017-11-07 DIAGNOSIS — D509 Iron deficiency anemia, unspecified: Secondary | ICD-10-CM | POA: Diagnosis not present

## 2017-11-09 DIAGNOSIS — D631 Anemia in chronic kidney disease: Secondary | ICD-10-CM | POA: Diagnosis not present

## 2017-11-09 DIAGNOSIS — D509 Iron deficiency anemia, unspecified: Secondary | ICD-10-CM | POA: Diagnosis not present

## 2017-11-09 DIAGNOSIS — N2581 Secondary hyperparathyroidism of renal origin: Secondary | ICD-10-CM | POA: Diagnosis not present

## 2017-11-09 DIAGNOSIS — N186 End stage renal disease: Secondary | ICD-10-CM | POA: Diagnosis not present

## 2017-11-14 DIAGNOSIS — D631 Anemia in chronic kidney disease: Secondary | ICD-10-CM | POA: Diagnosis not present

## 2017-11-14 DIAGNOSIS — N2581 Secondary hyperparathyroidism of renal origin: Secondary | ICD-10-CM | POA: Diagnosis not present

## 2017-11-14 DIAGNOSIS — D509 Iron deficiency anemia, unspecified: Secondary | ICD-10-CM | POA: Diagnosis not present

## 2017-11-14 DIAGNOSIS — N186 End stage renal disease: Secondary | ICD-10-CM | POA: Diagnosis not present

## 2017-11-16 DIAGNOSIS — D509 Iron deficiency anemia, unspecified: Secondary | ICD-10-CM | POA: Diagnosis not present

## 2017-11-16 DIAGNOSIS — D631 Anemia in chronic kidney disease: Secondary | ICD-10-CM | POA: Diagnosis not present

## 2017-11-16 DIAGNOSIS — N2581 Secondary hyperparathyroidism of renal origin: Secondary | ICD-10-CM | POA: Diagnosis not present

## 2017-11-16 DIAGNOSIS — N186 End stage renal disease: Secondary | ICD-10-CM | POA: Diagnosis not present

## 2017-11-18 DIAGNOSIS — N186 End stage renal disease: Secondary | ICD-10-CM | POA: Diagnosis not present

## 2017-11-18 DIAGNOSIS — N2581 Secondary hyperparathyroidism of renal origin: Secondary | ICD-10-CM | POA: Diagnosis not present

## 2017-11-18 DIAGNOSIS — D509 Iron deficiency anemia, unspecified: Secondary | ICD-10-CM | POA: Diagnosis not present

## 2017-11-18 DIAGNOSIS — D631 Anemia in chronic kidney disease: Secondary | ICD-10-CM | POA: Diagnosis not present

## 2017-11-21 DIAGNOSIS — D509 Iron deficiency anemia, unspecified: Secondary | ICD-10-CM | POA: Diagnosis not present

## 2017-11-21 DIAGNOSIS — N2581 Secondary hyperparathyroidism of renal origin: Secondary | ICD-10-CM | POA: Diagnosis not present

## 2017-11-21 DIAGNOSIS — D631 Anemia in chronic kidney disease: Secondary | ICD-10-CM | POA: Diagnosis not present

## 2017-11-21 DIAGNOSIS — N186 End stage renal disease: Secondary | ICD-10-CM | POA: Diagnosis not present

## 2017-11-24 DIAGNOSIS — D631 Anemia in chronic kidney disease: Secondary | ICD-10-CM | POA: Diagnosis not present

## 2017-11-24 DIAGNOSIS — D509 Iron deficiency anemia, unspecified: Secondary | ICD-10-CM | POA: Diagnosis not present

## 2017-11-24 DIAGNOSIS — E119 Type 2 diabetes mellitus without complications: Secondary | ICD-10-CM | POA: Diagnosis not present

## 2017-11-24 DIAGNOSIS — N2581 Secondary hyperparathyroidism of renal origin: Secondary | ICD-10-CM | POA: Diagnosis not present

## 2017-11-24 DIAGNOSIS — N186 End stage renal disease: Secondary | ICD-10-CM | POA: Diagnosis not present

## 2017-11-25 DIAGNOSIS — N2581 Secondary hyperparathyroidism of renal origin: Secondary | ICD-10-CM | POA: Diagnosis not present

## 2017-11-25 DIAGNOSIS — N186 End stage renal disease: Secondary | ICD-10-CM | POA: Diagnosis not present

## 2017-11-25 DIAGNOSIS — D509 Iron deficiency anemia, unspecified: Secondary | ICD-10-CM | POA: Diagnosis not present

## 2017-11-25 DIAGNOSIS — D631 Anemia in chronic kidney disease: Secondary | ICD-10-CM | POA: Diagnosis not present

## 2017-11-28 DIAGNOSIS — D631 Anemia in chronic kidney disease: Secondary | ICD-10-CM | POA: Diagnosis not present

## 2017-11-28 DIAGNOSIS — N2581 Secondary hyperparathyroidism of renal origin: Secondary | ICD-10-CM | POA: Diagnosis not present

## 2017-11-28 DIAGNOSIS — D509 Iron deficiency anemia, unspecified: Secondary | ICD-10-CM | POA: Diagnosis not present

## 2017-11-28 DIAGNOSIS — N186 End stage renal disease: Secondary | ICD-10-CM | POA: Diagnosis not present

## 2017-11-30 DIAGNOSIS — N2581 Secondary hyperparathyroidism of renal origin: Secondary | ICD-10-CM | POA: Diagnosis not present

## 2017-11-30 DIAGNOSIS — D631 Anemia in chronic kidney disease: Secondary | ICD-10-CM | POA: Diagnosis not present

## 2017-11-30 DIAGNOSIS — N186 End stage renal disease: Secondary | ICD-10-CM | POA: Diagnosis not present

## 2017-11-30 DIAGNOSIS — D509 Iron deficiency anemia, unspecified: Secondary | ICD-10-CM | POA: Diagnosis not present

## 2017-12-01 DIAGNOSIS — N186 End stage renal disease: Secondary | ICD-10-CM | POA: Diagnosis not present

## 2017-12-01 DIAGNOSIS — N033 Chronic nephritic syndrome with diffuse mesangial proliferative glomerulonephritis: Secondary | ICD-10-CM | POA: Diagnosis not present

## 2017-12-01 DIAGNOSIS — Z992 Dependence on renal dialysis: Secondary | ICD-10-CM | POA: Diagnosis not present

## 2017-12-02 DIAGNOSIS — N186 End stage renal disease: Secondary | ICD-10-CM | POA: Diagnosis not present

## 2017-12-02 DIAGNOSIS — N2581 Secondary hyperparathyroidism of renal origin: Secondary | ICD-10-CM | POA: Diagnosis not present

## 2017-12-02 DIAGNOSIS — Z992 Dependence on renal dialysis: Secondary | ICD-10-CM | POA: Diagnosis not present

## 2017-12-02 DIAGNOSIS — D631 Anemia in chronic kidney disease: Secondary | ICD-10-CM | POA: Diagnosis not present

## 2017-12-02 DIAGNOSIS — N033 Chronic nephritic syndrome with diffuse mesangial proliferative glomerulonephritis: Secondary | ICD-10-CM | POA: Diagnosis not present

## 2017-12-05 DIAGNOSIS — D631 Anemia in chronic kidney disease: Secondary | ICD-10-CM | POA: Diagnosis not present

## 2017-12-05 DIAGNOSIS — N2581 Secondary hyperparathyroidism of renal origin: Secondary | ICD-10-CM | POA: Diagnosis not present

## 2017-12-05 DIAGNOSIS — N186 End stage renal disease: Secondary | ICD-10-CM | POA: Diagnosis not present

## 2017-12-08 DIAGNOSIS — I10 Essential (primary) hypertension: Secondary | ICD-10-CM | POA: Diagnosis not present

## 2017-12-08 DIAGNOSIS — F418 Other specified anxiety disorders: Secondary | ICD-10-CM | POA: Diagnosis not present

## 2017-12-08 DIAGNOSIS — R110 Nausea: Secondary | ICD-10-CM | POA: Diagnosis not present

## 2017-12-08 DIAGNOSIS — G43109 Migraine with aura, not intractable, without status migrainosus: Secondary | ICD-10-CM | POA: Diagnosis not present

## 2017-12-08 DIAGNOSIS — J4520 Mild intermittent asthma, uncomplicated: Secondary | ICD-10-CM | POA: Diagnosis not present

## 2017-12-09 DIAGNOSIS — N186 End stage renal disease: Secondary | ICD-10-CM | POA: Diagnosis not present

## 2017-12-09 DIAGNOSIS — N2581 Secondary hyperparathyroidism of renal origin: Secondary | ICD-10-CM | POA: Diagnosis not present

## 2017-12-09 DIAGNOSIS — D631 Anemia in chronic kidney disease: Secondary | ICD-10-CM | POA: Diagnosis not present

## 2017-12-14 DIAGNOSIS — N186 End stage renal disease: Secondary | ICD-10-CM | POA: Diagnosis not present

## 2017-12-14 DIAGNOSIS — N2581 Secondary hyperparathyroidism of renal origin: Secondary | ICD-10-CM | POA: Diagnosis not present

## 2017-12-14 DIAGNOSIS — D631 Anemia in chronic kidney disease: Secondary | ICD-10-CM | POA: Diagnosis not present

## 2017-12-16 DIAGNOSIS — N2581 Secondary hyperparathyroidism of renal origin: Secondary | ICD-10-CM | POA: Diagnosis not present

## 2017-12-16 DIAGNOSIS — N186 End stage renal disease: Secondary | ICD-10-CM | POA: Diagnosis not present

## 2017-12-16 DIAGNOSIS — D631 Anemia in chronic kidney disease: Secondary | ICD-10-CM | POA: Diagnosis not present

## 2017-12-19 DIAGNOSIS — N186 End stage renal disease: Secondary | ICD-10-CM | POA: Diagnosis not present

## 2017-12-19 DIAGNOSIS — N2581 Secondary hyperparathyroidism of renal origin: Secondary | ICD-10-CM | POA: Diagnosis not present

## 2017-12-19 DIAGNOSIS — D631 Anemia in chronic kidney disease: Secondary | ICD-10-CM | POA: Diagnosis not present

## 2017-12-21 DIAGNOSIS — D631 Anemia in chronic kidney disease: Secondary | ICD-10-CM | POA: Diagnosis not present

## 2017-12-21 DIAGNOSIS — N2581 Secondary hyperparathyroidism of renal origin: Secondary | ICD-10-CM | POA: Diagnosis not present

## 2017-12-21 DIAGNOSIS — N186 End stage renal disease: Secondary | ICD-10-CM | POA: Diagnosis not present

## 2017-12-22 DIAGNOSIS — G894 Chronic pain syndrome: Secondary | ICD-10-CM | POA: Diagnosis not present

## 2017-12-22 DIAGNOSIS — M5417 Radiculopathy, lumbosacral region: Secondary | ICD-10-CM | POA: Diagnosis not present

## 2017-12-22 DIAGNOSIS — N186 End stage renal disease: Secondary | ICD-10-CM | POA: Diagnosis not present

## 2017-12-22 DIAGNOSIS — M533 Sacrococcygeal disorders, not elsewhere classified: Secondary | ICD-10-CM | POA: Diagnosis not present

## 2017-12-26 DIAGNOSIS — N2581 Secondary hyperparathyroidism of renal origin: Secondary | ICD-10-CM | POA: Diagnosis not present

## 2017-12-26 DIAGNOSIS — D631 Anemia in chronic kidney disease: Secondary | ICD-10-CM | POA: Diagnosis not present

## 2017-12-26 DIAGNOSIS — N186 End stage renal disease: Secondary | ICD-10-CM | POA: Diagnosis not present

## 2017-12-28 DIAGNOSIS — D631 Anemia in chronic kidney disease: Secondary | ICD-10-CM | POA: Diagnosis not present

## 2017-12-28 DIAGNOSIS — N2581 Secondary hyperparathyroidism of renal origin: Secondary | ICD-10-CM | POA: Diagnosis not present

## 2017-12-28 DIAGNOSIS — N186 End stage renal disease: Secondary | ICD-10-CM | POA: Diagnosis not present

## 2017-12-30 DIAGNOSIS — N186 End stage renal disease: Secondary | ICD-10-CM | POA: Diagnosis not present

## 2017-12-30 DIAGNOSIS — N2581 Secondary hyperparathyroidism of renal origin: Secondary | ICD-10-CM | POA: Diagnosis not present

## 2017-12-30 DIAGNOSIS — D631 Anemia in chronic kidney disease: Secondary | ICD-10-CM | POA: Diagnosis not present

## 2017-12-30 DIAGNOSIS — D509 Iron deficiency anemia, unspecified: Secondary | ICD-10-CM | POA: Diagnosis not present

## 2017-12-30 DIAGNOSIS — Z992 Dependence on renal dialysis: Secondary | ICD-10-CM | POA: Diagnosis not present

## 2017-12-30 DIAGNOSIS — N033 Chronic nephritic syndrome with diffuse mesangial proliferative glomerulonephritis: Secondary | ICD-10-CM | POA: Diagnosis not present

## 2018-01-02 DIAGNOSIS — N186 End stage renal disease: Secondary | ICD-10-CM | POA: Diagnosis not present

## 2018-01-02 DIAGNOSIS — N2581 Secondary hyperparathyroidism of renal origin: Secondary | ICD-10-CM | POA: Diagnosis not present

## 2018-01-02 DIAGNOSIS — D631 Anemia in chronic kidney disease: Secondary | ICD-10-CM | POA: Diagnosis not present

## 2018-01-02 DIAGNOSIS — D509 Iron deficiency anemia, unspecified: Secondary | ICD-10-CM | POA: Diagnosis not present

## 2018-01-04 DIAGNOSIS — N2581 Secondary hyperparathyroidism of renal origin: Secondary | ICD-10-CM | POA: Diagnosis not present

## 2018-01-04 DIAGNOSIS — D509 Iron deficiency anemia, unspecified: Secondary | ICD-10-CM | POA: Diagnosis not present

## 2018-01-04 DIAGNOSIS — N186 End stage renal disease: Secondary | ICD-10-CM | POA: Diagnosis not present

## 2018-01-04 DIAGNOSIS — D631 Anemia in chronic kidney disease: Secondary | ICD-10-CM | POA: Diagnosis not present

## 2018-01-06 DIAGNOSIS — D509 Iron deficiency anemia, unspecified: Secondary | ICD-10-CM | POA: Diagnosis not present

## 2018-01-06 DIAGNOSIS — N186 End stage renal disease: Secondary | ICD-10-CM | POA: Diagnosis not present

## 2018-01-06 DIAGNOSIS — N2581 Secondary hyperparathyroidism of renal origin: Secondary | ICD-10-CM | POA: Diagnosis not present

## 2018-01-06 DIAGNOSIS — D631 Anemia in chronic kidney disease: Secondary | ICD-10-CM | POA: Diagnosis not present

## 2018-01-10 ENCOUNTER — Ambulatory Visit: Payer: Medicare Other | Admitting: Vascular Surgery

## 2018-01-10 DIAGNOSIS — D631 Anemia in chronic kidney disease: Secondary | ICD-10-CM | POA: Diagnosis not present

## 2018-01-10 DIAGNOSIS — D509 Iron deficiency anemia, unspecified: Secondary | ICD-10-CM | POA: Diagnosis not present

## 2018-01-10 DIAGNOSIS — N186 End stage renal disease: Secondary | ICD-10-CM | POA: Diagnosis not present

## 2018-01-10 DIAGNOSIS — N2581 Secondary hyperparathyroidism of renal origin: Secondary | ICD-10-CM | POA: Diagnosis not present

## 2018-01-11 ENCOUNTER — Ambulatory Visit: Payer: Medicare Other | Admitting: Vascular Surgery

## 2018-01-11 DIAGNOSIS — D631 Anemia in chronic kidney disease: Secondary | ICD-10-CM | POA: Diagnosis not present

## 2018-01-11 DIAGNOSIS — D509 Iron deficiency anemia, unspecified: Secondary | ICD-10-CM | POA: Diagnosis not present

## 2018-01-11 DIAGNOSIS — N2581 Secondary hyperparathyroidism of renal origin: Secondary | ICD-10-CM | POA: Diagnosis not present

## 2018-01-11 DIAGNOSIS — N186 End stage renal disease: Secondary | ICD-10-CM | POA: Diagnosis not present

## 2018-01-13 DIAGNOSIS — N186 End stage renal disease: Secondary | ICD-10-CM | POA: Diagnosis not present

## 2018-01-13 DIAGNOSIS — D509 Iron deficiency anemia, unspecified: Secondary | ICD-10-CM | POA: Diagnosis not present

## 2018-01-13 DIAGNOSIS — D631 Anemia in chronic kidney disease: Secondary | ICD-10-CM | POA: Diagnosis not present

## 2018-01-13 DIAGNOSIS — N2581 Secondary hyperparathyroidism of renal origin: Secondary | ICD-10-CM | POA: Diagnosis not present

## 2018-01-16 DIAGNOSIS — D631 Anemia in chronic kidney disease: Secondary | ICD-10-CM | POA: Diagnosis not present

## 2018-01-16 DIAGNOSIS — N186 End stage renal disease: Secondary | ICD-10-CM | POA: Diagnosis not present

## 2018-01-16 DIAGNOSIS — N2581 Secondary hyperparathyroidism of renal origin: Secondary | ICD-10-CM | POA: Diagnosis not present

## 2018-01-16 DIAGNOSIS — D509 Iron deficiency anemia, unspecified: Secondary | ICD-10-CM | POA: Diagnosis not present

## 2018-01-18 DIAGNOSIS — D631 Anemia in chronic kidney disease: Secondary | ICD-10-CM | POA: Diagnosis not present

## 2018-01-18 DIAGNOSIS — N2581 Secondary hyperparathyroidism of renal origin: Secondary | ICD-10-CM | POA: Diagnosis not present

## 2018-01-18 DIAGNOSIS — N186 End stage renal disease: Secondary | ICD-10-CM | POA: Diagnosis not present

## 2018-01-18 DIAGNOSIS — D509 Iron deficiency anemia, unspecified: Secondary | ICD-10-CM | POA: Diagnosis not present

## 2018-01-23 DIAGNOSIS — N186 End stage renal disease: Secondary | ICD-10-CM | POA: Diagnosis not present

## 2018-01-23 DIAGNOSIS — N2581 Secondary hyperparathyroidism of renal origin: Secondary | ICD-10-CM | POA: Diagnosis not present

## 2018-01-23 DIAGNOSIS — D509 Iron deficiency anemia, unspecified: Secondary | ICD-10-CM | POA: Diagnosis not present

## 2018-01-23 DIAGNOSIS — D631 Anemia in chronic kidney disease: Secondary | ICD-10-CM | POA: Diagnosis not present

## 2018-01-26 DIAGNOSIS — D631 Anemia in chronic kidney disease: Secondary | ICD-10-CM | POA: Diagnosis not present

## 2018-01-26 DIAGNOSIS — N186 End stage renal disease: Secondary | ICD-10-CM | POA: Diagnosis not present

## 2018-01-26 DIAGNOSIS — D509 Iron deficiency anemia, unspecified: Secondary | ICD-10-CM | POA: Diagnosis not present

## 2018-01-26 DIAGNOSIS — N2581 Secondary hyperparathyroidism of renal origin: Secondary | ICD-10-CM | POA: Diagnosis not present

## 2018-01-27 DIAGNOSIS — D509 Iron deficiency anemia, unspecified: Secondary | ICD-10-CM | POA: Diagnosis not present

## 2018-01-27 DIAGNOSIS — N186 End stage renal disease: Secondary | ICD-10-CM | POA: Diagnosis not present

## 2018-01-27 DIAGNOSIS — N2581 Secondary hyperparathyroidism of renal origin: Secondary | ICD-10-CM | POA: Diagnosis not present

## 2018-01-27 DIAGNOSIS — D631 Anemia in chronic kidney disease: Secondary | ICD-10-CM | POA: Diagnosis not present

## 2018-01-30 DIAGNOSIS — N033 Chronic nephritic syndrome with diffuse mesangial proliferative glomerulonephritis: Secondary | ICD-10-CM | POA: Diagnosis not present

## 2018-01-30 DIAGNOSIS — Z992 Dependence on renal dialysis: Secondary | ICD-10-CM | POA: Diagnosis not present

## 2018-01-30 DIAGNOSIS — D509 Iron deficiency anemia, unspecified: Secondary | ICD-10-CM | POA: Diagnosis not present

## 2018-01-30 DIAGNOSIS — N2581 Secondary hyperparathyroidism of renal origin: Secondary | ICD-10-CM | POA: Diagnosis not present

## 2018-01-30 DIAGNOSIS — D631 Anemia in chronic kidney disease: Secondary | ICD-10-CM | POA: Diagnosis not present

## 2018-01-30 DIAGNOSIS — N186 End stage renal disease: Secondary | ICD-10-CM | POA: Diagnosis not present

## 2018-01-31 ENCOUNTER — Ambulatory Visit: Payer: Medicare Other | Admitting: Neurology

## 2018-01-31 ENCOUNTER — Telehealth: Payer: Self-pay | Admitting: *Deleted

## 2018-01-31 DIAGNOSIS — R1084 Generalized abdominal pain: Secondary | ICD-10-CM | POA: Diagnosis not present

## 2018-01-31 NOTE — Telephone Encounter (Signed)
No showed new patient appointment. 

## 2018-02-01 ENCOUNTER — Encounter: Payer: Self-pay | Admitting: Neurology

## 2018-02-02 DIAGNOSIS — D631 Anemia in chronic kidney disease: Secondary | ICD-10-CM | POA: Diagnosis not present

## 2018-02-02 DIAGNOSIS — N2581 Secondary hyperparathyroidism of renal origin: Secondary | ICD-10-CM | POA: Diagnosis not present

## 2018-02-02 DIAGNOSIS — N186 End stage renal disease: Secondary | ICD-10-CM | POA: Diagnosis not present

## 2018-02-02 DIAGNOSIS — D509 Iron deficiency anemia, unspecified: Secondary | ICD-10-CM | POA: Diagnosis not present

## 2018-02-08 DIAGNOSIS — D509 Iron deficiency anemia, unspecified: Secondary | ICD-10-CM | POA: Diagnosis not present

## 2018-02-08 DIAGNOSIS — N2581 Secondary hyperparathyroidism of renal origin: Secondary | ICD-10-CM | POA: Diagnosis not present

## 2018-02-08 DIAGNOSIS — D631 Anemia in chronic kidney disease: Secondary | ICD-10-CM | POA: Diagnosis not present

## 2018-02-08 DIAGNOSIS — N186 End stage renal disease: Secondary | ICD-10-CM | POA: Diagnosis not present

## 2018-02-10 DIAGNOSIS — D509 Iron deficiency anemia, unspecified: Secondary | ICD-10-CM | POA: Diagnosis not present

## 2018-02-10 DIAGNOSIS — D631 Anemia in chronic kidney disease: Secondary | ICD-10-CM | POA: Diagnosis not present

## 2018-02-10 DIAGNOSIS — N2581 Secondary hyperparathyroidism of renal origin: Secondary | ICD-10-CM | POA: Diagnosis not present

## 2018-02-10 DIAGNOSIS — N186 End stage renal disease: Secondary | ICD-10-CM | POA: Diagnosis not present

## 2018-02-13 DIAGNOSIS — N2581 Secondary hyperparathyroidism of renal origin: Secondary | ICD-10-CM | POA: Diagnosis not present

## 2018-02-13 DIAGNOSIS — D631 Anemia in chronic kidney disease: Secondary | ICD-10-CM | POA: Diagnosis not present

## 2018-02-13 DIAGNOSIS — D509 Iron deficiency anemia, unspecified: Secondary | ICD-10-CM | POA: Diagnosis not present

## 2018-02-13 DIAGNOSIS — N186 End stage renal disease: Secondary | ICD-10-CM | POA: Diagnosis not present

## 2018-02-15 ENCOUNTER — Ambulatory Visit (INDEPENDENT_AMBULATORY_CARE_PROVIDER_SITE_OTHER): Payer: Medicare Other | Admitting: Physician Assistant

## 2018-02-15 ENCOUNTER — Other Ambulatory Visit: Payer: Self-pay

## 2018-02-15 ENCOUNTER — Encounter: Payer: Self-pay | Admitting: Vascular Surgery

## 2018-02-15 VITALS — BP 132/74 | HR 80 | Temp 98.5°F | Resp 14 | Ht 64.0 in | Wt 135.0 lb

## 2018-02-15 DIAGNOSIS — D509 Iron deficiency anemia, unspecified: Secondary | ICD-10-CM | POA: Diagnosis not present

## 2018-02-15 DIAGNOSIS — Z992 Dependence on renal dialysis: Secondary | ICD-10-CM

## 2018-02-15 DIAGNOSIS — N186 End stage renal disease: Secondary | ICD-10-CM | POA: Diagnosis not present

## 2018-02-15 DIAGNOSIS — D631 Anemia in chronic kidney disease: Secondary | ICD-10-CM | POA: Diagnosis not present

## 2018-02-15 DIAGNOSIS — N2581 Secondary hyperparathyroidism of renal origin: Secondary | ICD-10-CM | POA: Diagnosis not present

## 2018-02-15 NOTE — Progress Notes (Addendum)
Established Dialysis Access   History of Present Illness   [name] is a 47 y.o. (1971-08-24) female who presents for re-evaluation of her left arm brachiocephalic fistula.  She is status post left cephalic vein turndown and revision of left brachiocephalic AV fistula by Dr. Scot Dock in March 2018.  The patient felt strongly about salvaging her left arm fistula despite recommendations to create new permanent access in right arm and ligate left arm fistula due to the widespread aneurysmal dilatation.  Dr. Nicole Cella plan was to complete cephalic vein turned down then  plicate the aneurysmal area near her AC fossa at a later date.  She has now returned to office and would like to proceed with plication of left brachiocephalic fistula aneurysm.  She is dialyzing on a Monday Wednesday Friday schedule.  She does have some bleeding from needle sites after hemodialysis treatments however she denies any other complications with hemodialysis.  She is a current tobacco smoker.  The patient's PMH, PSH, SH, and FamHx were reviewed on 02/15/18 are unchanged from prior office visit.  Current Outpatient Medications  Medication Sig Dispense Refill  . acetaminophen (TYLENOL) 325 MG tablet Take 2 tablets (650 mg total) by mouth every 8 (eight) hours as needed for mild pain, fever or headache. 30 tablet 0  . albuterol (PROVENTIL HFA;VENTOLIN HFA) 108 (90 BASE) MCG/ACT inhaler Inhale 2 puffs into the lungs every 6 (six) hours as needed for shortness of breath.     . ALPRAZolam (XANAX) 1 MG tablet Take 1 mg by mouth 3 (three) times daily.    Marland Kitchen amLODipine (NORVASC) 10 MG tablet Take 10 mg by mouth at bedtime.     Marland Kitchen b complex vitamins tablet Take 1 tablet by mouth daily.    . budesonide-formoterol (SYMBICORT) 160-4.5 MCG/ACT inhaler Inhale 2 puffs into the lungs 2 (two) times daily.    Marland Kitchen buPROPion (WELLBUTRIN SR) 150 MG 12 hr tablet Take 150 mg by mouth daily.    . calcium acetate (PHOSLO) 667 MG capsule  Take 2,001 mg by mouth 2 (two) times daily with a meal.    . cyclobenzaprine (FLEXERIL) 10 MG tablet Take 20 mg by mouth at bedtime.     . DULoxetine (CYMBALTA) 60 MG capsule Take 60 mg by mouth daily.    Marland Kitchen HYDROmorphone (DILAUDID) 2 MG tablet Take 0.5 tablets (1 mg total) by mouth every 6 (six) hours as needed for moderate pain or severe pain. 14 tablet 0  . lidocaine-prilocaine (EMLA) cream Apply 1 application topically See admin instructions. Apply topically one hour prior to dialysis - Monday, Wednesday, Friday    . Melatonin 5 MG TABS Take 5 mg by mouth at bedtime.     . metoprolol tartrate (LOPRESSOR) 25 MG tablet Take 1 tablet (25 mg total) by mouth 2 (two) times daily. 60 tablet 0  . montelukast (SINGULAIR) 10 MG tablet Take 10 mg by mouth daily.     . multivitamin (RENA-VIT) TABS tablet Take 1 tablet by mouth daily.    . nicotine (NICODERM CQ - DOSED IN MG/24 HOURS) 14 mg/24hr patch Place 1 patch (14 mg total) onto the skin daily. 28 patch 0  . oxyCODONE ER (XTAMPZA ER) 36 MG C12A Take 36 mg by mouth 2 (two) times daily.    . sevelamer carbonate (RENVELA) 800 MG tablet Take 2,400 mg by mouth 2 (two) times daily with a meal.     . SUMAtriptan (IMITREX) 50 MG tablet Take 50 mg by mouth  daily as needed for migraine or headache.     . oxyCODONE (OXYCONTIN) 40 mg 12 hr tablet Take 40 mg by mouth every 12 (twelve) hours.     No current facility-administered medications for this visit.     On ROS today: 10 system ROS is negative unless otherwise noted in HPI.   Physical Examination   Vitals:   02/15/18 1625  BP: 132/74  Pulse: 80  Resp: 14  Temp: 98.5 F (36.9 C)  TempSrc: Oral  SpO2: 99%  Weight: 135 lb (61.2 kg)  Height: 5\' 4"  (1.626 m)   Body mass index is 23.17 kg/m.  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 female who presents with ESRD requiring hemodialysis.    Plan had been to plicate left brachiocephalic fistula aneurysmal dilatation having had left cephalic vein turndown and left upper arm  After discussion with Dr. Scot Dock during office visit today including length of incision, the patient has now decided against proceeding with plication surgery  Other than cosmesis, aneurysmal dilatation of left arm fistula is not bothersome to the patient and is not causing any pain  Encouraged to continue to avoid sticking area in question during dialysis  Patient states she will return to office if her fistula becomes symptomatic or she would like to proceed with plication surgery or new dialysis access   Dagoberto Ligas, PA-C [facility] Office: [phone]

## 2018-02-17 DIAGNOSIS — N186 End stage renal disease: Secondary | ICD-10-CM | POA: Diagnosis not present

## 2018-02-17 DIAGNOSIS — D509 Iron deficiency anemia, unspecified: Secondary | ICD-10-CM | POA: Diagnosis not present

## 2018-02-17 DIAGNOSIS — D631 Anemia in chronic kidney disease: Secondary | ICD-10-CM | POA: Diagnosis not present

## 2018-02-17 DIAGNOSIS — N2581 Secondary hyperparathyroidism of renal origin: Secondary | ICD-10-CM | POA: Diagnosis not present

## 2018-02-21 DIAGNOSIS — N186 End stage renal disease: Secondary | ICD-10-CM | POA: Diagnosis not present

## 2018-02-21 DIAGNOSIS — D631 Anemia in chronic kidney disease: Secondary | ICD-10-CM | POA: Diagnosis not present

## 2018-02-21 DIAGNOSIS — D509 Iron deficiency anemia, unspecified: Secondary | ICD-10-CM | POA: Diagnosis not present

## 2018-02-21 DIAGNOSIS — N2581 Secondary hyperparathyroidism of renal origin: Secondary | ICD-10-CM | POA: Diagnosis not present

## 2018-02-22 DIAGNOSIS — G894 Chronic pain syndrome: Secondary | ICD-10-CM | POA: Diagnosis not present

## 2018-02-22 DIAGNOSIS — E119 Type 2 diabetes mellitus without complications: Secondary | ICD-10-CM | POA: Diagnosis not present

## 2018-02-22 DIAGNOSIS — M533 Sacrococcygeal disorders, not elsewhere classified: Secondary | ICD-10-CM | POA: Diagnosis not present

## 2018-02-22 DIAGNOSIS — M5417 Radiculopathy, lumbosacral region: Secondary | ICD-10-CM | POA: Diagnosis not present

## 2018-02-22 DIAGNOSIS — D509 Iron deficiency anemia, unspecified: Secondary | ICD-10-CM | POA: Diagnosis not present

## 2018-02-22 DIAGNOSIS — N186 End stage renal disease: Secondary | ICD-10-CM | POA: Diagnosis not present

## 2018-02-22 DIAGNOSIS — Z5181 Encounter for therapeutic drug level monitoring: Secondary | ICD-10-CM | POA: Diagnosis not present

## 2018-02-22 DIAGNOSIS — M549 Dorsalgia, unspecified: Secondary | ICD-10-CM | POA: Diagnosis not present

## 2018-02-22 DIAGNOSIS — D631 Anemia in chronic kidney disease: Secondary | ICD-10-CM | POA: Diagnosis not present

## 2018-02-22 DIAGNOSIS — N2581 Secondary hyperparathyroidism of renal origin: Secondary | ICD-10-CM | POA: Diagnosis not present

## 2018-02-22 DIAGNOSIS — Z79899 Other long term (current) drug therapy: Secondary | ICD-10-CM | POA: Diagnosis not present

## 2018-02-24 DIAGNOSIS — D631 Anemia in chronic kidney disease: Secondary | ICD-10-CM | POA: Diagnosis not present

## 2018-02-24 DIAGNOSIS — D509 Iron deficiency anemia, unspecified: Secondary | ICD-10-CM | POA: Diagnosis not present

## 2018-02-24 DIAGNOSIS — N2581 Secondary hyperparathyroidism of renal origin: Secondary | ICD-10-CM | POA: Diagnosis not present

## 2018-02-24 DIAGNOSIS — N186 End stage renal disease: Secondary | ICD-10-CM | POA: Diagnosis not present

## 2018-02-27 DIAGNOSIS — N2581 Secondary hyperparathyroidism of renal origin: Secondary | ICD-10-CM | POA: Diagnosis not present

## 2018-02-27 DIAGNOSIS — D631 Anemia in chronic kidney disease: Secondary | ICD-10-CM | POA: Diagnosis not present

## 2018-02-27 DIAGNOSIS — N186 End stage renal disease: Secondary | ICD-10-CM | POA: Diagnosis not present

## 2018-02-27 DIAGNOSIS — D509 Iron deficiency anemia, unspecified: Secondary | ICD-10-CM | POA: Diagnosis not present

## 2018-03-01 ENCOUNTER — Other Ambulatory Visit: Payer: Self-pay

## 2018-03-01 ENCOUNTER — Encounter (HOSPITAL_COMMUNITY): Payer: Self-pay | Admitting: *Deleted

## 2018-03-01 ENCOUNTER — Other Ambulatory Visit: Payer: Self-pay | Admitting: *Deleted

## 2018-03-01 DIAGNOSIS — D509 Iron deficiency anemia, unspecified: Secondary | ICD-10-CM | POA: Diagnosis not present

## 2018-03-01 DIAGNOSIS — Z992 Dependence on renal dialysis: Secondary | ICD-10-CM | POA: Diagnosis not present

## 2018-03-01 DIAGNOSIS — Z9889 Other specified postprocedural states: Secondary | ICD-10-CM | POA: Diagnosis not present

## 2018-03-01 DIAGNOSIS — N2581 Secondary hyperparathyroidism of renal origin: Secondary | ICD-10-CM | POA: Diagnosis not present

## 2018-03-01 DIAGNOSIS — Z79899 Other long term (current) drug therapy: Secondary | ICD-10-CM | POA: Diagnosis not present

## 2018-03-01 DIAGNOSIS — I120 Hypertensive chronic kidney disease with stage 5 chronic kidney disease or end stage renal disease: Secondary | ICD-10-CM | POA: Diagnosis not present

## 2018-03-01 DIAGNOSIS — D631 Anemia in chronic kidney disease: Secondary | ICD-10-CM | POA: Diagnosis not present

## 2018-03-01 DIAGNOSIS — T82898A Other specified complication of vascular prosthetic devices, implants and grafts, initial encounter: Secondary | ICD-10-CM | POA: Diagnosis not present

## 2018-03-01 DIAGNOSIS — N033 Chronic nephritic syndrome with diffuse mesangial proliferative glomerulonephritis: Secondary | ICD-10-CM | POA: Diagnosis not present

## 2018-03-01 DIAGNOSIS — N186 End stage renal disease: Secondary | ICD-10-CM | POA: Diagnosis not present

## 2018-03-01 DIAGNOSIS — F17200 Nicotine dependence, unspecified, uncomplicated: Secondary | ICD-10-CM | POA: Diagnosis not present

## 2018-03-01 NOTE — Progress Notes (Signed)
Spoke with pt for pre-op call. Pt denies cardiac history or chest pain. States she is not a diabetic.

## 2018-03-02 ENCOUNTER — Encounter (HOSPITAL_COMMUNITY)
Admission: RE | Disposition: A | Discharge status: Home / Self Care | Payer: Self-pay | Source: Ambulatory Visit | Attending: Vascular Surgery

## 2018-03-02 ENCOUNTER — Telehealth: Payer: Self-pay | Admitting: Vascular Surgery

## 2018-03-02 ENCOUNTER — Ambulatory Visit (HOSPITAL_COMMUNITY): Payer: Medicare Other | Admitting: Anesthesiology

## 2018-03-02 ENCOUNTER — Ambulatory Visit (HOSPITAL_COMMUNITY)
Admission: RE | Admit: 2018-03-02 | Discharge: 2018-03-02 | Disposition: A | Discharge status: Home / Self Care | Payer: Medicare Other | Source: Ambulatory Visit | Attending: Vascular Surgery | Admitting: Vascular Surgery

## 2018-03-02 DIAGNOSIS — Z79899 Other long term (current) drug therapy: Secondary | ICD-10-CM | POA: Insufficient documentation

## 2018-03-02 DIAGNOSIS — Z9889 Other specified postprocedural states: Secondary | ICD-10-CM | POA: Insufficient documentation

## 2018-03-02 DIAGNOSIS — Z992 Dependence on renal dialysis: Secondary | ICD-10-CM | POA: Insufficient documentation

## 2018-03-02 DIAGNOSIS — T82898A Other specified complication of vascular prosthetic devices, implants and grafts, initial encounter: Secondary | ICD-10-CM | POA: Diagnosis not present

## 2018-03-02 DIAGNOSIS — F17200 Nicotine dependence, unspecified, uncomplicated: Secondary | ICD-10-CM | POA: Diagnosis not present

## 2018-03-02 DIAGNOSIS — I120 Hypertensive chronic kidney disease with stage 5 chronic kidney disease or end stage renal disease: Secondary | ICD-10-CM | POA: Diagnosis not present

## 2018-03-02 DIAGNOSIS — Y832 Surgical operation with anastomosis, bypass or graft as the cause of abnormal reaction of the patient, or of later complication, without mention of misadventure at the time of the procedure: Secondary | ICD-10-CM | POA: Insufficient documentation

## 2018-03-02 DIAGNOSIS — N186 End stage renal disease: Secondary | ICD-10-CM | POA: Diagnosis not present

## 2018-03-02 HISTORY — DX: Nausea with vomiting, unspecified: R11.2

## 2018-03-02 HISTORY — DX: Diverticulitis of intestine, part unspecified, without perforation or abscess without bleeding: K57.92

## 2018-03-02 HISTORY — DX: Pneumonia, unspecified organism: J18.9

## 2018-03-02 HISTORY — PX: REVISON OF ARTERIOVENOUS FISTULA: SHX6074

## 2018-03-02 HISTORY — DX: Other specified postprocedural states: Z98.890

## 2018-03-02 LAB — POCT I-STAT 4, (NA,K, GLUC, HGB,HCT)
Glucose, Bld: 92 mg/dL (ref 65–99)
HCT: 31 % — ABNORMAL LOW (ref 36.0–46.0)
Hemoglobin: 10.5 g/dL — ABNORMAL LOW (ref 12.0–15.0)
Potassium: 3.4 mmol/L — ABNORMAL LOW (ref 3.5–5.1)
Sodium: 141 mmol/L (ref 135–145)

## 2018-03-02 SURGERY — REVISON OF ARTERIOVENOUS FISTULA
Anesthesia: General | Site: Arm Upper | Laterality: Left

## 2018-03-02 MED ORDER — FENTANYL CITRATE (PF) 250 MCG/5ML IJ SOLN
INTRAMUSCULAR | Status: AC
Start: 2018-03-02 — End: 2018-03-02
  Filled 2018-03-02: qty 5

## 2018-03-02 MED ORDER — FENTANYL CITRATE (PF) 100 MCG/2ML IJ SOLN
INTRAMUSCULAR | Status: DC | PRN
  Administered 2018-03-02 (×2): 25 ug via INTRAVENOUS

## 2018-03-02 MED ORDER — HYDROMORPHONE HCL 2 MG/ML IJ SOLN: 0.3000 mg | INTRAMUSCULAR | Status: DC | PRN

## 2018-03-02 MED ORDER — DEXAMETHASONE SODIUM PHOSPHATE 10 MG/ML IJ SOLN
INTRAMUSCULAR | Status: AC
  Filled 2018-03-02: qty 1

## 2018-03-02 MED ORDER — HEPARIN SODIUM (PORCINE) 1000 UNIT/ML IJ SOLN
INTRAMUSCULAR | Status: DC | PRN
  Administered 2018-03-02: 7000 [IU] via INTRAVENOUS

## 2018-03-02 MED ORDER — HEPARIN SODIUM (PORCINE) 1000 UNIT/ML IJ SOLN
INTRAMUSCULAR | Status: AC
  Filled 2018-03-02: qty 1

## 2018-03-02 MED ORDER — EPHEDRINE SULFATE 50 MG/ML IJ SOLN
INTRAMUSCULAR | Status: AC
  Filled 2018-03-02: qty 1

## 2018-03-02 MED ORDER — PROPOFOL 10 MG/ML IV BOLUS
INTRAVENOUS | Status: DC | PRN
  Administered 2018-03-02: 150 mg via INTRAVENOUS

## 2018-03-02 MED ORDER — SODIUM CHLORIDE 0.9 % IV SOLN
INTRAVENOUS | Status: DC | PRN
  Administered 2018-03-02: 09:00:00

## 2018-03-02 MED ORDER — ONDANSETRON HCL 4 MG/2ML IJ SOLN
INTRAMUSCULAR | Status: DC | PRN
  Administered 2018-03-02: 4 mg via INTRAVENOUS

## 2018-03-02 MED ORDER — MEPERIDINE HCL 50 MG/ML IJ SOLN: 6.2500 mg | INTRAMUSCULAR | Status: DC | PRN

## 2018-03-02 MED ORDER — DEXAMETHASONE SODIUM PHOSPHATE 10 MG/ML IJ SOLN
INTRAMUSCULAR | Status: DC | PRN
  Administered 2018-03-02: 10 mg via INTRAVENOUS

## 2018-03-02 MED ORDER — SODIUM CHLORIDE 0.9 % IV SOLN
INTRAVENOUS | Status: AC
  Filled 2018-03-02: qty 1.2

## 2018-03-02 MED ORDER — SCOPOLAMINE 1 MG/3DAYS TD PT72
MEDICATED_PATCH | TRANSDERMAL | Status: AC
  Filled 2018-03-02: qty 1

## 2018-03-02 MED ORDER — LIDOCAINE-EPINEPHRINE (PF) 1 %-1:200000 IJ SOLN
INTRAMUSCULAR | Status: DC | PRN
  Administered 2018-03-02: 30 mL

## 2018-03-02 MED ORDER — SODIUM CHLORIDE 0.9 % IV SOLN
INTRAVENOUS | Status: DC
  Administered 2018-03-02: 10 mL/h via INTRAVENOUS
  Administered 2018-03-02: 10:00:00 via INTRAVENOUS

## 2018-03-02 MED ORDER — CEFAZOLIN SODIUM-DEXTROSE 2-4 GM/100ML-% IV SOLN
INTRAVENOUS | Status: AC
  Filled 2018-03-02: qty 100

## 2018-03-02 MED ORDER — 0.9 % SODIUM CHLORIDE (POUR BTL) OPTIME
TOPICAL | Status: DC | PRN
  Administered 2018-03-02: 1000 mL

## 2018-03-02 MED ORDER — ONDANSETRON HCL 4 MG/2ML IJ SOLN
INTRAMUSCULAR | Status: AC
  Filled 2018-03-02: qty 4

## 2018-03-02 MED ORDER — SODIUM CHLORIDE 0.9 % IJ SOLN
INTRAMUSCULAR | Status: AC
  Filled 2018-03-02: qty 10

## 2018-03-02 MED ORDER — PROTAMINE SULFATE 10 MG/ML IV SOLN
INTRAVENOUS | Status: DC | PRN
  Administered 2018-03-02: 10 mg via INTRAVENOUS
  Administered 2018-03-02 (×2): 20 mg via INTRAVENOUS

## 2018-03-02 MED ORDER — CEFAZOLIN SODIUM-DEXTROSE 2-4 GM/100ML-% IV SOLN
2.0000 g | INTRAVENOUS | Status: AC
  Administered 2018-03-02: 2 g via INTRAVENOUS

## 2018-03-02 MED ORDER — LIDOCAINE-EPINEPHRINE (PF) 1 %-1:200000 IJ SOLN
INTRAMUSCULAR | Status: AC
  Filled 2018-03-02: qty 30

## 2018-03-02 MED ORDER — SODIUM CHLORIDE 0.9 % IV SOLN: INTRAVENOUS | Status: DC

## 2018-03-02 MED ORDER — MIDAZOLAM HCL 2 MG/2ML IJ SOLN: 0.5000 mg | Freq: Once | INTRAMUSCULAR | Status: DC | PRN

## 2018-03-02 MED ORDER — LIDOCAINE 2% (20 MG/ML) 5 ML SYRINGE
INTRAMUSCULAR | Status: AC
  Filled 2018-03-02: qty 10

## 2018-03-02 MED ORDER — LIDOCAINE HCL (CARDIAC) PF 100 MG/5ML IV SOSY
PREFILLED_SYRINGE | INTRAVENOUS | Status: DC | PRN
  Administered 2018-03-02: 20 mg via INTRAVENOUS

## 2018-03-02 MED ORDER — SCOPOLAMINE 1 MG/3DAYS TD PT72
MEDICATED_PATCH | TRANSDERMAL | Status: DC | PRN
  Administered 2018-03-02: 1 via TRANSDERMAL

## 2018-03-02 MED ORDER — MIDAZOLAM HCL 2 MG/2ML IJ SOLN
INTRAMUSCULAR | Status: AC
  Filled 2018-03-02: qty 2

## 2018-03-02 MED ORDER — PROMETHAZINE HCL 25 MG/ML IJ SOLN: 6.2500 mg | INTRAMUSCULAR | Status: DC | PRN

## 2018-03-02 MED ORDER — HEMOSTATIC AGENTS (NO CHARGE) OPTIME
TOPICAL | Status: DC | PRN
  Administered 2018-03-02: 1 via TOPICAL

## 2018-03-02 SURGICAL SUPPLY — 39 items
ARMBAND PINK RESTRICT EXTREMIT (MISCELLANEOUS) ×3 IMPLANT
BANDAGE ELASTIC 4 VELCRO ST LF (GAUZE/BANDAGES/DRESSINGS) ×3 IMPLANT
CANISTER SUCT 3000ML PPV (MISCELLANEOUS) ×3 IMPLANT
CLIP VESOCCLUDE MED 6/CT (CLIP) ×3 IMPLANT
CLIP VESOCCLUDE SM WIDE 6/CT (CLIP) ×3 IMPLANT
COVER PROBE W GEL 5X96 (DRAPES) ×3 IMPLANT
DERMABOND ADVANCED (GAUZE/BANDAGES/DRESSINGS) ×2
DERMABOND ADVANCED .7 DNX12 (GAUZE/BANDAGES/DRESSINGS) ×1 IMPLANT
ELECT REM PT RETURN 9FT ADLT (ELECTROSURGICAL) ×3
ELECTRODE REM PT RTRN 9FT ADLT (ELECTROSURGICAL) ×1 IMPLANT
GLOVE BIO SURGEON STRL SZ 6.5 (GLOVE) ×6 IMPLANT
GLOVE BIO SURGEON STRL SZ7.5 (GLOVE) ×3 IMPLANT
GLOVE BIO SURGEONS STRL SZ 6.5 (GLOVE) ×3
GLOVE BIOGEL PI IND STRL 6.5 (GLOVE) ×1 IMPLANT
GLOVE BIOGEL PI IND STRL 7.0 (GLOVE) ×2 IMPLANT
GLOVE BIOGEL PI INDICATOR 6.5 (GLOVE) ×2
GLOVE BIOGEL PI INDICATOR 7.0 (GLOVE) ×4
GLOVE SURG SS PI 6.5 STRL IVOR (GLOVE) ×3 IMPLANT
GOWN STRL REUS W/ TWL LRG LVL3 (GOWN DISPOSABLE) ×4 IMPLANT
GOWN STRL REUS W/ TWL XL LVL3 (GOWN DISPOSABLE) ×1 IMPLANT
GOWN STRL REUS W/TWL LRG LVL3 (GOWN DISPOSABLE) ×8
GOWN STRL REUS W/TWL XL LVL3 (GOWN DISPOSABLE) ×2
GRAFT COLLAGEN VASCULAR 6X15 (Vascular Products) ×2 IMPLANT
GRAFT COLLAGEN VASCULAR 6X19 (Vascular Products) ×1 IMPLANT
HEMOSTAT SNOW SURGICEL 2X4 (HEMOSTASIS) ×3 IMPLANT
KIT BASIN OR (CUSTOM PROCEDURE TRAY) ×3 IMPLANT
KIT TURNOVER KIT B (KITS) ×3 IMPLANT
NS IRRIG 1000ML POUR BTL (IV SOLUTION) ×3 IMPLANT
PACK CV ACCESS (CUSTOM PROCEDURE TRAY) ×3 IMPLANT
PAD ARMBOARD 7.5X6 YLW CONV (MISCELLANEOUS) ×6 IMPLANT
SUT MNCRL AB 4-0 PS2 18 (SUTURE) ×6 IMPLANT
SUT PROLENE 5 0 C 1 24 (SUTURE) ×6 IMPLANT
SUT PROLENE 6 0 BV (SUTURE) ×3 IMPLANT
SUT VIC AB 3-0 SH 27 (SUTURE) ×6
SUT VIC AB 3-0 SH 27X BRD (SUTURE) ×2 IMPLANT
SYRINGE TOOMEY DISP (SYRINGE) ×3 IMPLANT
TOWEL GREEN STERILE (TOWEL DISPOSABLE) ×3 IMPLANT
UNDERPAD 30X30 (UNDERPADS AND DIAPERS) ×3 IMPLANT
WATER STERILE IRR 1000ML POUR (IV SOLUTION) ×3 IMPLANT

## 2018-03-02 NOTE — Transfer of Care (Signed)
Immediate Anesthesia Transfer of Care Note  Patient: [name]  Procedure(s) Performed: REVISION OF BRACHIOCEPHALIC ARTERIOVENOUS FISTULA LEFT UPPER ARM USING ARTEGRAFT (Left Arm Upper)  Patient Location: PACU  Anesthesia Type:General  Level of Consciousness: sedated  Airway & Oxygen Therapy: Patient Spontanous Breathing and Patient connected to face mask oxygen  Post-op Assessment: Report given to RN, Post -op Vital signs reviewed and stable and Patient moving all extremities X 4  Post vital signs: Reviewed and stable  Last Vitals:  Vitals Value Taken Time  BP 125/70 03/02/2018 10:20 AM  Temp    Pulse 71 03/02/2018 10:22 AM  Resp 12 03/02/2018 10:22 AM  SpO2 100 % 03/02/2018 10:22 AM  Vitals shown include unvalidated device data.  Last Pain:  Vitals:   03/02/18 0737  TempSrc:   PainSc: 8       Patients Stated Pain Goal: 3 (12/75/17 0017)  Complications: No apparent anesthesia complications

## 2018-03-02 NOTE — Anesthesia Postprocedure Evaluation (Signed)
Anesthesia Post Note  Patient: [name]  Procedure(s) Performed: REVISION OF BRACHIOCEPHALIC ARTERIOVENOUS FISTULA LEFT UPPER ARM USING ARTEGRAFT (Left Arm Upper)     Patient location during evaluation: PACU Anesthesia Type: General Level of consciousness: sedated, oriented and patient cooperative Pain management: pain level controlled Vital Signs Assessment: post-procedure vital signs reviewed and stable Respiratory status: spontaneous breathing, nonlabored ventilation, respiratory function stable and patient connected to nasal cannula oxygen Cardiovascular status: blood pressure returned to baseline and stable Postop Assessment: no apparent nausea or vomiting Anesthetic complications: no    Last Vitals:  Vitals:   03/02/18 1157 03/02/18 1215  BP:    Pulse: 72 72  Resp: (!) 9 (!) 9  Temp:  (!) 36.2 C  SpO2: 95% 95%    Last Pain:  Vitals:   03/02/18 1150  TempSrc:   PainSc: Asleep                 Raden Byington,E. Gunnar Hereford

## 2018-03-02 NOTE — Anesthesia Procedure Notes (Signed)
Procedure Name: LMA Insertion Date/Time: 03/02/2018 8:50 AM Performed by: Neldon Newport, CRNA Pre-anesthesia Checklist: Timeout performed, Patient being monitored, Suction available, Emergency Drugs available and Patient identified Patient Re-evaluated:Patient Re-evaluated prior to induction Oxygen Delivery Method: Circle system utilized Preoxygenation: Pre-oxygenation with 100% oxygen Induction Type: IV induction Ventilation: Mask ventilation without difficulty LMA: LMA inserted LMA Size: 4.0 Number of attempts: 1 Placement Confirmation: positive ETCO2 and breath sounds checked- equal and bilateral Tube secured with: Tape Dental Injury: Teeth and Oropharynx as per pre-operative assessment

## 2018-03-02 NOTE — Discharge Instructions (Signed)
[facility]  Discharge Instructions  AV Fistula or Graft Surgery for Dialysis Access  Please refer to the following instructions for your post-procedure care. Your surgeon or physician assistant will discuss any changes with you.  Activity  You may drive the day following your surgery, if you are comfortable and no longer taking prescription pain medication. Resume full activity as the soreness in your incision resolves.  Bathing/Showering  You may shower after you go home. Keep your incision dry for 48 hours. Do not soak in a bathtub, hot tub, or swim until the incision heals completely. You may not shower if you have a hemodialysis catheter.  Incision Care  Clean your incision with mild soap and water after 48 hours. Pat the area dry with a clean towel. You do not need a bandage unless otherwise instructed. Do not apply any ointments or creams to your incision. You may have skin glue on your incision. Do not peel it off. It will come off on its own in about one week. Your arm may swell a bit after surgery. To reduce swelling use pillows to elevate your arm so it is above your heart. Your doctor will tell you if you need to lightly wrap your arm with an ACE bandage.  Remove ACE bandage at next dialysis treatment.   Diet  Resume your normal diet. There are not special food restrictions following this procedure. In order to heal from your surgery, it is CRITICAL to get adequate nutrition. Your body requires vitamins, minerals, and protein. Vegetables are the best source of vitamins and minerals. Vegetables also provide the perfect balance of protein. Processed food has little nutritional value, so try to avoid this.  Medications  Resume taking all of your medications. If your incision is causing pain, you may take over-the counter pain relievers such as acetaminophen (Tylenol). If you were prescribed a stronger pain medication, please be aware these  medications can cause nausea and constipation. Prevent nausea by taking the medication with a snack or meal. Avoid constipation by drinking plenty of fluids and eating foods with high amount of fiber, such as fruits, vegetables, and grains.  Do not take Tylenol if you are taking prescription pain medications.  Follow up Your surgeon may want to see you in the office following your access surgery. If so, this will be arranged at the time of your surgery.  Please call us immediately for any of the following conditions:  Increased pain, redness, drainage (pus) from your incision site Fever of 101 degrees or higher Severe or worsening pain at your incision site Hand pain or numbness.  Reduce your risk of vascular disease:  Stop smoking. If you would like help, call [facility] at 1-800-QUIT-NOW 225 359 0841) or [place] at [place] your cholesterol Maintain a desired weight Control your diabetes Keep your blood pressure down  Dialysis  It will take several weeks to several months for your new dialysis access to be ready for use. Your surgeon will determine when it is okay to use it. Your nephrologist will continue to direct your dialysis. You can continue to use your Permcath until your new access is ready for use.   03/02/2018 [name] [id number] 09-Jan-1971  Surgeon(s): Waynetta Sandy, MD  Procedure(s): REVISION OF BRACHIOCEPHALIC ARTERIOVENOUS FISTULA LEFT UPPER ARM USING ARTEGRAFT    X May stick graft on designated area only:  Do NOT stick over incisions or between incisions for 4 weeks. May stick above  proximal incision now. SEE DIAGRAM.    If you have any questions, please call the office at [phone].

## 2018-03-02 NOTE — Anesthesia Preprocedure Evaluation (Addendum)
Anesthesia Evaluation  Patient identified by MRN, date of birth, ID band Patient awake    Reviewed: Allergy & Precautions, NPO status , Patient's Chart, lab work & pertinent test results  History of Anesthesia Complications (+) PONV  Airway Mallampati: II  TM Distance: >3 FB Neck ROM: Full    Dental  (+) Chipped, Dental Advisory Given   Pulmonary sleep apnea , COPD,  COPD inhaler, Current Smoker, former smoker,    breath sounds clear to auscultation       Cardiovascular hypertension, Pt. on medications and Pt. on home beta blockers (-) angina Rhythm:Regular Rate:Normal  '18 ECHO: EF 55-60%, valves OK   Neuro/Psych  Headaches, PSYCHIATRIC DISORDERS (ADD) Anxiety Depression    GI/Hepatic Neg liver ROS, GERD  Medicated and Controlled,  Endo/Other  negative endocrine ROS  Renal/GU Dialysis and ESRFRenal diseaseK+ 3.4     Musculoskeletal   Abdominal   Peds  Hematology  (+) anemia ,   Anesthesia Other Findings Patient appears very sleepy  Reproductive/Obstetrics                          Anesthesia Physical Anesthesia Plan  ASA: III  Anesthesia Plan: General   Post-op Pain Management:    Induction: Intravenous  PONV Risk Score and Plan: 4 or greater and Ondansetron, Dexamethasone and Scopolamine patch - Pre-op  Airway Management Planned: LMA  Additional Equipment:   Intra-op Plan:   Post-operative Plan:   Informed Consent: I have reviewed the patients History and Physical, chart, labs and discussed the procedure including the risks, benefits and alternatives for the proposed anesthesia with the patient or authorized representative who has indicated his/her understanding and acceptance.   Dental advisory given  Plan Discussed with: CRNA and Surgeon  Anesthesia Plan Comments: (Plan routine monitors, GA- LMA OK)        Anesthesia Quick Evaluation

## 2018-03-02 NOTE — Op Note (Signed)
    Patient name: [name] MRN: [id number] DOB: [birth year] Sex: female  03/02/2018 Pre-operative Diagnosis: End-stage renal disease, fusiform dilatation of left arm AV fistula Post-operative diagnosis:  Same Surgeon:  Eda Paschal. Donzetta Matters, MD  and Leontine Locket, [place] Procedure Performed: Revision of left arm AV fistula with 6 mm Artegraft interposition  Indications: 47 year old female with end-stage renal disease on dialysis via left upper arm AV fistula in 2013.  His fistula has had a cephalic vein turned down after cephalic vein balloon angioplasty failed.  She now has fusiform dilatation of the entire proximal segment of the fistula and this is not being cannulated for dialysis.  She also has pain in the site and is indicated for revision.  Findings: There is fusiform dilatation of the approximately 3-1/2 cm of the entire proximal segment of the fistula and this was removed entirely through 2 small counterincisions.  It was replaced with interposition 6 mm Artegraft   Procedure:  The patient was identified in the holding area and taken to the operating room where she was placed supine on the operating table and general anesthesia was induced.  She was sterilely prepped and draped in the left arm usual fashion given antibiotics and timeout was called.  We began with a longitudinal incision overlying the arterial anastomosis of the fistula.  We dissected down and got proximal control by placing a vessel loop around the fistula just past the anastomosis.  We made a counterincision at the distal aspect of the fusiform aneurysm on the lateral part of the arm was also longitudinal.  We dissected down to place a vessel loop around this fistula.  We then dissected out the entirety of the fistula using cautery through the 2 incisions.  Patient was then given 7000 units of heparin we clamped proximally and distally on the fistula and excised the entire fusiform segment.  We then tunneled 6 mm Artegraft  and sewed end-to-end first proximally and flushed to the graft and then and and distally and allowed flushing antegrade and retrograde prior to completing.  Upon completion there was strong thrill throughout the graft.  We irrigated obtained hemostasis and closed both incisions and 2 layers with Vicryl and Monocryl.  All counts were correct at completion.  There was a palpable thrill in the graft and a strong radial signal with Doppler at completion.  She was given 50 mg of protamine prior to closure of the skin.  She tolerated well without immediate comp occasion.  Next  EBL: 50 cc.  Sharise Lippy C. Donzetta Matters, MD [facility] [place] Office: [phone] Pager: [phone]

## 2018-03-02 NOTE — Telephone Encounter (Signed)
Sched appt 03/24/18 at 2:30. Lm on hm# to inform pt of appt.

## 2018-03-02 NOTE — Progress Notes (Signed)
Patient was looking for prescription for pain meds. since she was told pre-op she will have one. Called Dr. Donzetta Matters and per PA patient already on pain meds. that she don't need another one. Patient informed and said ok.

## 2018-03-02 NOTE — Telephone Encounter (Signed)
-----   Message from Mena Goes, RN sent at 03/02/2018 10:35 AM EDT ----- Regarding: 2-3 weeks post AVG    ----- Message ----- From: Gabriel Earing, PA-C Sent: 03/02/2018  10:10 AM To: [facility] Charge Pool  S/p revision of LUA AVF 03/02/18. See Dr. Donzetta Matters back in 2-3 weeks.  Thanks

## 2018-03-02 NOTE — H&P (Signed)
   History and Physical Update  The patient was interviewed and re-examined.  The patient's previous History and Physical has been reviewed and is unchanged from recent office visit. Will plan to revise proximal segment of of fusiform aneurysmal fistula likely with artegraft. This should leave her with usable avf more cephalad to avoid a catheter. Will likely need repeat fistulogram in the future and consideration of replacement of cephalad portion on existing avf where there has been a turndown previously. She demonstrates good understanding and will plan for OR today.  Amillion Scobee C. Donzetta Matters, MD [facility] [place] Office: [phone] Pager: [phone]   03/02/2018, 7:29 AM

## 2018-03-03 ENCOUNTER — Encounter (HOSPITAL_COMMUNITY): Payer: Self-pay | Admitting: Vascular Surgery

## 2018-03-03 DIAGNOSIS — N186 End stage renal disease: Secondary | ICD-10-CM | POA: Diagnosis not present

## 2018-03-03 DIAGNOSIS — I120 Hypertensive chronic kidney disease with stage 5 chronic kidney disease or end stage renal disease: Secondary | ICD-10-CM | POA: Diagnosis not present

## 2018-03-03 DIAGNOSIS — G894 Chronic pain syndrome: Secondary | ICD-10-CM | POA: Diagnosis not present

## 2018-03-03 DIAGNOSIS — T82510D Breakdown (mechanical) of surgically created arteriovenous fistula, subsequent encounter: Secondary | ICD-10-CM | POA: Diagnosis not present

## 2018-03-03 DIAGNOSIS — R011 Cardiac murmur, unspecified: Secondary | ICD-10-CM | POA: Diagnosis not present

## 2018-03-03 DIAGNOSIS — Z992 Dependence on renal dialysis: Secondary | ICD-10-CM | POA: Diagnosis not present

## 2018-03-07 ENCOUNTER — Ambulatory Visit: Payer: Medicare Other | Admitting: Vascular Surgery

## 2018-03-07 DIAGNOSIS — D631 Anemia in chronic kidney disease: Secondary | ICD-10-CM | POA: Diagnosis not present

## 2018-03-07 DIAGNOSIS — T82510D Breakdown (mechanical) of surgically created arteriovenous fistula, subsequent encounter: Secondary | ICD-10-CM | POA: Diagnosis not present

## 2018-03-07 DIAGNOSIS — G894 Chronic pain syndrome: Secondary | ICD-10-CM | POA: Diagnosis not present

## 2018-03-07 DIAGNOSIS — N2581 Secondary hyperparathyroidism of renal origin: Secondary | ICD-10-CM | POA: Diagnosis not present

## 2018-03-07 DIAGNOSIS — R011 Cardiac murmur, unspecified: Secondary | ICD-10-CM | POA: Diagnosis not present

## 2018-03-07 DIAGNOSIS — I120 Hypertensive chronic kidney disease with stage 5 chronic kidney disease or end stage renal disease: Secondary | ICD-10-CM | POA: Diagnosis not present

## 2018-03-07 DIAGNOSIS — Z992 Dependence on renal dialysis: Secondary | ICD-10-CM | POA: Diagnosis not present

## 2018-03-07 DIAGNOSIS — D509 Iron deficiency anemia, unspecified: Secondary | ICD-10-CM | POA: Diagnosis not present

## 2018-03-07 DIAGNOSIS — N186 End stage renal disease: Secondary | ICD-10-CM | POA: Diagnosis not present

## 2018-03-08 DIAGNOSIS — N2581 Secondary hyperparathyroidism of renal origin: Secondary | ICD-10-CM | POA: Diagnosis not present

## 2018-03-08 DIAGNOSIS — D631 Anemia in chronic kidney disease: Secondary | ICD-10-CM | POA: Diagnosis not present

## 2018-03-08 DIAGNOSIS — D509 Iron deficiency anemia, unspecified: Secondary | ICD-10-CM | POA: Diagnosis not present

## 2018-03-08 DIAGNOSIS — N186 End stage renal disease: Secondary | ICD-10-CM | POA: Diagnosis not present

## 2018-03-09 ENCOUNTER — Ambulatory Visit (INDEPENDENT_AMBULATORY_CARE_PROVIDER_SITE_OTHER): Payer: Medicare Other | Admitting: Family

## 2018-03-09 ENCOUNTER — Inpatient Hospital Stay (HOSPITAL_COMMUNITY)
Admission: EM | Admit: 2018-03-09 | Discharge: 2018-03-11 | DRG: 177 | Disposition: A | Discharge status: Home / Self Care | Payer: Medicare Other | Attending: Family Medicine | Admitting: Family Medicine

## 2018-03-09 ENCOUNTER — Emergency Department (HOSPITAL_COMMUNITY): Payer: Medicare Other

## 2018-03-09 ENCOUNTER — Other Ambulatory Visit: Payer: Self-pay

## 2018-03-09 ENCOUNTER — Encounter (HOSPITAL_COMMUNITY): Payer: Self-pay | Admitting: Emergency Medicine

## 2018-03-09 ENCOUNTER — Encounter: Payer: Self-pay | Admitting: Family

## 2018-03-09 VITALS — BP 185/97 | HR 87 | Temp 102.1°F | Resp 18 | Ht 64.0 in | Wt 137.0 lb

## 2018-03-09 DIAGNOSIS — M898X9 Other specified disorders of bone, unspecified site: Secondary | ICD-10-CM | POA: Diagnosis present

## 2018-03-09 DIAGNOSIS — M109 Gout, unspecified: Secondary | ICD-10-CM | POA: Diagnosis present

## 2018-03-09 DIAGNOSIS — Z8249 Family history of ischemic heart disease and other diseases of the circulatory system: Secondary | ICD-10-CM

## 2018-03-09 DIAGNOSIS — F419 Anxiety disorder, unspecified: Secondary | ICD-10-CM | POA: Diagnosis present

## 2018-03-09 DIAGNOSIS — G4733 Obstructive sleep apnea (adult) (pediatric): Secondary | ICD-10-CM | POA: Diagnosis present

## 2018-03-09 DIAGNOSIS — D631 Anemia in chronic kidney disease: Secondary | ICD-10-CM | POA: Diagnosis present

## 2018-03-09 DIAGNOSIS — M7989 Other specified soft tissue disorders: Secondary | ICD-10-CM | POA: Diagnosis present

## 2018-03-09 DIAGNOSIS — D649 Anemia, unspecified: Secondary | ICD-10-CM | POA: Diagnosis not present

## 2018-03-09 DIAGNOSIS — G894 Chronic pain syndrome: Secondary | ICD-10-CM | POA: Diagnosis present

## 2018-03-09 DIAGNOSIS — I10 Essential (primary) hypertension: Secondary | ICD-10-CM | POA: Diagnosis not present

## 2018-03-09 DIAGNOSIS — J690 Pneumonitis due to inhalation of food and vomit: Principal | ICD-10-CM | POA: Diagnosis present

## 2018-03-09 DIAGNOSIS — R609 Edema, unspecified: Secondary | ICD-10-CM | POA: Diagnosis not present

## 2018-03-09 DIAGNOSIS — Z881 Allergy status to other antibiotic agents status: Secondary | ICD-10-CM | POA: Diagnosis not present

## 2018-03-09 DIAGNOSIS — J168 Pneumonia due to other specified infectious organisms: Secondary | ICD-10-CM | POA: Diagnosis not present

## 2018-03-09 DIAGNOSIS — M79602 Pain in left arm: Secondary | ICD-10-CM

## 2018-03-09 DIAGNOSIS — I120 Hypertensive chronic kidney disease with stage 5 chronic kidney disease or end stage renal disease: Secondary | ICD-10-CM | POA: Diagnosis present

## 2018-03-09 DIAGNOSIS — Z79899 Other long term (current) drug therapy: Secondary | ICD-10-CM | POA: Diagnosis not present

## 2018-03-09 DIAGNOSIS — F988 Other specified behavioral and emotional disorders with onset usually occurring in childhood and adolescence: Secondary | ICD-10-CM | POA: Diagnosis present

## 2018-03-09 DIAGNOSIS — J40 Bronchitis, not specified as acute or chronic: Secondary | ICD-10-CM | POA: Diagnosis present

## 2018-03-09 DIAGNOSIS — Z992 Dependence on renal dialysis: Secondary | ICD-10-CM

## 2018-03-09 DIAGNOSIS — Z7951 Long term (current) use of inhaled steroids: Secondary | ICD-10-CM

## 2018-03-09 DIAGNOSIS — Z87891 Personal history of nicotine dependence: Secondary | ICD-10-CM

## 2018-03-09 DIAGNOSIS — Z5329 Procedure and treatment not carried out because of patient's decision for other reasons: Secondary | ICD-10-CM | POA: Diagnosis not present

## 2018-03-09 DIAGNOSIS — N186 End stage renal disease: Secondary | ICD-10-CM

## 2018-03-09 DIAGNOSIS — Z91018 Allergy to other foods: Secondary | ICD-10-CM | POA: Diagnosis not present

## 2018-03-09 DIAGNOSIS — Z5181 Encounter for therapeutic drug level monitoring: Secondary | ICD-10-CM | POA: Diagnosis not present

## 2018-03-09 DIAGNOSIS — J189 Pneumonia, unspecified organism: Secondary | ICD-10-CM | POA: Diagnosis not present

## 2018-03-09 DIAGNOSIS — Z885 Allergy status to narcotic agent status: Secondary | ICD-10-CM

## 2018-03-09 DIAGNOSIS — R0600 Dyspnea, unspecified: Secondary | ICD-10-CM | POA: Diagnosis not present

## 2018-03-09 DIAGNOSIS — F329 Major depressive disorder, single episode, unspecified: Secondary | ICD-10-CM | POA: Diagnosis present

## 2018-03-09 DIAGNOSIS — G8918 Other acute postprocedural pain: Secondary | ICD-10-CM | POA: Diagnosis not present

## 2018-03-09 DIAGNOSIS — Y95 Nosocomial condition: Secondary | ICD-10-CM | POA: Diagnosis present

## 2018-03-09 DIAGNOSIS — Z888 Allergy status to other drugs, medicaments and biological substances status: Secondary | ICD-10-CM | POA: Diagnosis not present

## 2018-03-09 DIAGNOSIS — J9601 Acute respiratory failure with hypoxia: Secondary | ICD-10-CM | POA: Diagnosis present

## 2018-03-09 DIAGNOSIS — D696 Thrombocytopenia, unspecified: Secondary | ICD-10-CM | POA: Diagnosis not present

## 2018-03-09 DIAGNOSIS — M533 Sacrococcygeal disorders, not elsewhere classified: Secondary | ICD-10-CM | POA: Diagnosis not present

## 2018-03-09 DIAGNOSIS — R5383 Other fatigue: Secondary | ICD-10-CM | POA: Diagnosis not present

## 2018-03-09 DIAGNOSIS — M5417 Radiculopathy, lumbosacral region: Secondary | ICD-10-CM | POA: Diagnosis not present

## 2018-03-09 DIAGNOSIS — I770 Arteriovenous fistula, acquired: Secondary | ICD-10-CM

## 2018-03-09 DIAGNOSIS — R509 Fever, unspecified: Secondary | ICD-10-CM

## 2018-03-09 DIAGNOSIS — R0789 Other chest pain: Secondary | ICD-10-CM | POA: Diagnosis not present

## 2018-03-09 DIAGNOSIS — R0602 Shortness of breath: Secondary | ICD-10-CM | POA: Diagnosis not present

## 2018-03-09 LAB — URINALYSIS, ROUTINE W REFLEX MICROSCOPIC
Glucose, UA: NEGATIVE mg/dL
Hgb urine dipstick: NEGATIVE
Ketones, ur: NEGATIVE mg/dL
Leukocytes, UA: NEGATIVE
Nitrite: NEGATIVE
PH: 9 — AB (ref 5.0–8.0)
Protein, ur: >=300 mg/dL — AB
SPECIFIC GRAVITY, URINE: 1.014 (ref 1.005–1.030)

## 2018-03-09 LAB — CBC WITH DIFFERENTIAL/PLATELET
BASOS PCT: 0 %
Basophils Absolute: 0 10*3/uL (ref 0.0–0.1)
EOS ABS: 0 10*3/uL (ref 0.0–0.7)
Eosinophils Relative: 0 %
HCT: 33.7 % — ABNORMAL LOW (ref 36.0–46.0)
Hemoglobin: 10.1 g/dL — ABNORMAL LOW (ref 12.0–15.0)
Lymphocytes Relative: 21 %
Lymphs Abs: 0.9 10*3/uL (ref 0.7–4.0)
MCH: 30.1 pg (ref 26.0–34.0)
MCHC: 30 g/dL (ref 30.0–36.0)
MCV: 100.6 fL — ABNORMAL HIGH (ref 78.0–100.0)
MONO ABS: 0.4 10*3/uL (ref 0.1–1.0)
Monocytes Relative: 9 %
Neutro Abs: 3.1 10*3/uL (ref 1.7–7.7)
Neutrophils Relative %: 70 %
PLATELETS: 106 10*3/uL — AB (ref 150–400)
RBC: 3.35 MIL/uL — ABNORMAL LOW (ref 3.87–5.11)
RDW: 15.7 % — AB (ref 11.5–15.5)
WBC: 4.4 10*3/uL (ref 4.0–10.5)

## 2018-03-09 LAB — COMPREHENSIVE METABOLIC PANEL
ALBUMIN: 3.5 g/dL (ref 3.5–5.0)
ALK PHOS: 119 U/L (ref 38–126)
ALT: 5 U/L — ABNORMAL LOW (ref 14–54)
ANION GAP: 12 (ref 5–15)
AST: 29 U/L (ref 15–41)
BILIRUBIN TOTAL: 0.9 mg/dL (ref 0.3–1.2)
BUN: 25 mg/dL — AB (ref 6–20)
CALCIUM: 8.9 mg/dL (ref 8.9–10.3)
CO2: 30 mmol/L (ref 22–32)
Chloride: 97 mmol/L — ABNORMAL LOW (ref 101–111)
Creatinine, Ser: 6.37 mg/dL — ABNORMAL HIGH (ref 0.44–1.00)
GFR calc Af Amer: 8 mL/min — ABNORMAL LOW (ref >60)
GFR, EST NON AFRICAN AMERICAN: 7 mL/min — AB (ref >60)
GLUCOSE: 112 mg/dL — AB (ref 65–99)
Potassium: 3.8 mmol/L (ref 3.5–5.1)
Sodium: 139 mmol/L (ref 135–145)
TOTAL PROTEIN: 6 g/dL — AB (ref 6.5–8.1)

## 2018-03-09 LAB — I-STAT TROPONIN, ED: TROPONIN I, POC: 0.02 ng/mL (ref 0.00–0.08)

## 2018-03-09 LAB — I-STAT CG4 LACTIC ACID, ED
LACTIC ACID, VENOUS: 0.67 mmol/L (ref 0.5–1.9)
Lactic Acid, Venous: 0.82 mmol/L (ref 0.5–1.9)

## 2018-03-09 LAB — I-STAT BETA HCG BLOOD, ED (MC, WL, AP ONLY): I-stat hCG, quantitative: <5 m[IU]/mL (ref <5)

## 2018-03-09 LAB — PROTIME-INR
INR: 1.08
PROTHROMBIN TIME: 13.9 s (ref 11.4–15.2)

## 2018-03-09 MED ORDER — ACETAMINOPHEN 650 MG RE SUPP: 650.0000 mg | Freq: Four times a day (QID) | RECTAL | Status: DC | PRN

## 2018-03-09 MED ORDER — CYCLOBENZAPRINE HCL 10 MG PO TABS
20.0000 mg | ORAL_TABLET | Freq: Every day | ORAL | Status: DC
  Administered 2018-03-09 – 2018-03-10 (×2): 20 mg via ORAL
  Filled 2018-03-09 (×2): qty 2

## 2018-03-09 MED ORDER — ONDANSETRON HCL 4 MG/2ML IJ SOLN
4.0000 mg | Freq: Once | INTRAMUSCULAR | Status: AC
  Administered 2018-03-09: 4 mg via INTRAVENOUS
  Filled 2018-03-09: qty 2

## 2018-03-09 MED ORDER — VANCOMYCIN HCL IN DEXTROSE 1-5 GM/200ML-% IV SOLN
1000.0000 mg | Freq: Once | INTRAVENOUS | Status: AC
  Administered 2018-03-09: 1000 mg via INTRAVENOUS
  Filled 2018-03-09: qty 200

## 2018-03-09 MED ORDER — SEVELAMER CARBONATE 800 MG PO TABS
2400.0000 mg | ORAL_TABLET | Freq: Two times a day (BID) | ORAL | Status: DC
  Administered 2018-03-10 – 2018-03-11 (×4): 2400 mg via ORAL
  Filled 2018-03-09 (×4): qty 3

## 2018-03-09 MED ORDER — IPRATROPIUM-ALBUTEROL 0.5-2.5 (3) MG/3ML IN SOLN
3.0000 mL | RESPIRATORY_TRACT | Status: DC
  Administered 2018-03-09 – 2018-03-11 (×7): 3 mL via RESPIRATORY_TRACT
  Filled 2018-03-09 (×8): qty 3

## 2018-03-09 MED ORDER — ONDANSETRON HCL 4 MG PO TABS: 4.0000 mg | ORAL_TABLET | Freq: Four times a day (QID) | ORAL | Status: DC | PRN

## 2018-03-09 MED ORDER — MORPHINE SULFATE (PF) 4 MG/ML IV SOLN: 4.0000 mg | Freq: Once | INTRAVENOUS | Status: DC

## 2018-03-09 MED ORDER — MOMETASONE FURO-FORMOTEROL FUM 200-5 MCG/ACT IN AERO
2.0000 | INHALATION_SPRAY | Freq: Two times a day (BID) | RESPIRATORY_TRACT | Status: DC
  Administered 2018-03-10 – 2018-03-11 (×3): 2 via RESPIRATORY_TRACT
  Filled 2018-03-09: qty 8.8

## 2018-03-09 MED ORDER — IOPAMIDOL (ISOVUE-370) INJECTION 76%
INTRAVENOUS | Status: AC
  Filled 2018-03-09: qty 100

## 2018-03-09 MED ORDER — RENA-VITE PO TABS
1.0000 | ORAL_TABLET | Freq: Every day | ORAL | Status: DC
  Administered 2018-03-09 – 2018-03-10 (×2): 1 via ORAL
  Filled 2018-03-09 (×2): qty 1

## 2018-03-09 MED ORDER — MONTELUKAST SODIUM 10 MG PO TABS
10.0000 mg | ORAL_TABLET | Freq: Every day | ORAL | Status: DC
  Administered 2018-03-10 – 2018-03-11 (×2): 10 mg via ORAL
  Filled 2018-03-09 (×2): qty 1

## 2018-03-09 MED ORDER — IPRATROPIUM-ALBUTEROL 0.5-2.5 (3) MG/3ML IN SOLN
3.0000 mL | RESPIRATORY_TRACT | Status: DC | PRN
  Administered 2018-03-10: 3 mL via RESPIRATORY_TRACT

## 2018-03-09 MED ORDER — CALCIUM ACETATE (PHOS BINDER) 667 MG PO CAPS
2001.0000 mg | ORAL_CAPSULE | Freq: Two times a day (BID) | ORAL | Status: DC
  Administered 2018-03-10 – 2018-03-11 (×4): 2001 mg via ORAL
  Filled 2018-03-09 (×4): qty 3

## 2018-03-09 MED ORDER — HYDRALAZINE HCL 20 MG/ML IJ SOLN
10.0000 mg | INTRAMUSCULAR | Status: DC | PRN
  Administered 2018-03-09: 10 mg via INTRAVENOUS
  Filled 2018-03-09: qty 1

## 2018-03-09 MED ORDER — ALPRAZOLAM 0.5 MG PO TABS
1.0000 mg | ORAL_TABLET | Freq: Three times a day (TID) | ORAL | Status: DC | PRN
  Administered 2018-03-10 – 2018-03-11 (×3): 1 mg via ORAL
  Filled 2018-03-09 (×3): qty 2

## 2018-03-09 MED ORDER — METOPROLOL TARTRATE 25 MG PO TABS
25.0000 mg | ORAL_TABLET | Freq: Two times a day (BID) | ORAL | Status: DC
  Administered 2018-03-09 – 2018-03-11 (×4): 25 mg via ORAL
  Filled 2018-03-09 (×4): qty 1

## 2018-03-09 MED ORDER — AMLODIPINE BESYLATE 10 MG PO TABS
10.0000 mg | ORAL_TABLET | Freq: Every day | ORAL | Status: DC
  Administered 2018-03-09 – 2018-03-10 (×2): 10 mg via ORAL
  Filled 2018-03-09 (×2): qty 1

## 2018-03-09 MED ORDER — ACETAMINOPHEN 325 MG PO TABS
650.0000 mg | ORAL_TABLET | Freq: Once | ORAL | Status: AC | PRN
  Administered 2018-03-09: 650 mg via ORAL
  Filled 2018-03-09: qty 2

## 2018-03-09 MED ORDER — BUPROPION HCL ER (SR) 150 MG PO TB12
150.0000 mg | ORAL_TABLET | Freq: Every day | ORAL | Status: DC
  Administered 2018-03-10 – 2018-03-11 (×2): 150 mg via ORAL
  Filled 2018-03-09 (×2): qty 1

## 2018-03-09 MED ORDER — IOPAMIDOL (ISOVUE-370) INJECTION 76%
100.0000 mL | Freq: Once | INTRAVENOUS | Status: AC | PRN
  Administered 2018-03-09: 100 mL via INTRAVENOUS

## 2018-03-09 MED ORDER — HYDROMORPHONE HCL 2 MG/ML IJ SOLN
0.5000 mg | INTRAMUSCULAR | Status: DC | PRN
  Administered 2018-03-09: 0.5 mg via INTRAVENOUS
  Filled 2018-03-09: qty 1

## 2018-03-09 MED ORDER — HEPARIN SODIUM (PORCINE) 5000 UNIT/ML IJ SOLN
5000.0000 [IU] | Freq: Three times a day (TID) | INTRAMUSCULAR | Status: DC
  Filled 2018-03-09 (×2): qty 1

## 2018-03-09 MED ORDER — ONDANSETRON HCL 4 MG/2ML IJ SOLN
4.0000 mg | Freq: Four times a day (QID) | INTRAMUSCULAR | Status: DC | PRN
  Administered 2018-03-10: 4 mg via INTRAVENOUS
  Filled 2018-03-09: qty 2

## 2018-03-09 MED ORDER — ACETAMINOPHEN 325 MG PO TABS
650.0000 mg | ORAL_TABLET | Freq: Four times a day (QID) | ORAL | Status: DC | PRN
  Administered 2018-03-09: 650 mg via ORAL
  Filled 2018-03-09: qty 2

## 2018-03-09 NOTE — ED Provider Notes (Signed)
I saw and evaluated the patient, reviewed the resident's note and I agree with the findings and plan.   EKG Interpretation  Date/Time:  Thursday Mar 09 2018 15:42:35 EDT Ventricular Rate:  88 PR Interval:  200 QRS Duration: 90 QT Interval:  364 QTC Calculation: 440 R Axis:   -34 Text Interpretation:  Normal sinus rhythm Left axis deviation Abnormal QRS-T angle, consider primary T wave abnormality Abnormal ECG Confirmed by Lacretia Leigh ([id number]) on 03/09/2018 4:53:57 PM      47 year old female here complaining of cough and shortness of breath x2 days.  Also notes left upper extremity swelling with fever as well.  Has thrill noted to her dialysis graft in her left upper extremity.  She is febrile here.  Patient be started on IV antibiotics will obtain ultrasound of patient's upper extremity as well as CTA chest to rule out PE versus pneumonia.   Lacretia Leigh, MD 03/09/18 [phone]

## 2018-03-09 NOTE — ED Notes (Signed)
Pulse ox 84-85% on room air while patient sleeping. Oxygen applied at 2 LPM via nasal cannula.

## 2018-03-09 NOTE — ED Notes (Signed)
Report toJasmina RN

## 2018-03-09 NOTE — ED Notes (Signed)
Patient transported to CT 

## 2018-03-09 NOTE — ED Provider Notes (Signed)
Patient placed in Quick Look pathway, seen and evaluated   Chief Complaint: Fever, cough, left arm pain  HPI:   Patient states that she had a dialysis fistula placed on May 2 on her left arm.  States that after that she developed fevers, worsening pain, redness and swelling to the site.  She went to dialysis today where she was noted to have a fever.  Patient reports a nonproductive cough for the past 2 days and some shortness of breath.  Denies any associated abdominal pain, diarrhea or urinary symptoms.  Patient has not taken Tylenol for her fever at home.  ROS: Fever, chills, left arm pain, cough  Negative for abdominal pain, urinary symptoms, change in bowel habits, headache, vision changes  Physical Exam:   Gen: No distress  Neuro: Awake and Alert  Skin: Warm    Focused Exam: Fistula to left upper arm with thrill palpated.  It is very tender to palpation.  No purulent drainage.  Site had some erythema around the incision with swelling and warmth noted.  Radial pulses are 2+ bilaterally.  Edema to the left arm.  Wheezing and rhonchorous sounds in the right lower lobe.  Otherwise lungs clear to auscultation.  Heart regular rate and rhythm.  No focal abdominal tenderness palpation.  Full range of motion of neck.   Initiation of care has begun. The patient has been counseled on the process, plan, and necessity for staying for the completion/evaluation, and the remainder of the medical screening examination   Discussed with the patient that exiting the department prior to completion of the work-up is AMA and there is no guarantee that there are no emergency medical conditions present.     Doristine Devoid, PA-C 03/09/18 1552    Carmin Muskrat, MD 03/09/18 2043

## 2018-03-09 NOTE — H&P (Addendum)
History and Physical    [name] QIO:[id number] DOB: [birth year] DOA: 03/09/2018  PCP: Enid Skeens., MD  Patient coming from: Home.  Chief Complaint: Left upper extremity swelling and shortness of breath.  HPI: [name] is a 47 y.o. female with history of ESRD on hemodialysis on Monday Wednesday Friday, hypertension, anemia, previous history of tobacco abuse presents to the ER with complaints of increasing pain and swelling in the left upper extremity over the last 1 week.  Patient had revision of the AV fistula done by Dr. Donzetta Matters last week on Mar 02, 2018.  Patient states that since then the swelling is mildly worsened.  Had seen occasionally some bloody discharge but no active discharge at this time.  Over the last 2 days has been become more short of breath with productive cough fever and chills and left-sided pleuritic chest pain.  Had some nausea vomiting but denies any abdominal pain.  No restriction of movement of the left upper extremity and no tingling or numbness.  ED Course: In the ER patient has good pulse on the left upper extremity.  Left upper extremity is swollen compared to right with some warmth.  Dopplers have been ordered which are pending to rule out any deep venous thrombosis.  CT angiogram of the chest done shows pneumonia.  Patient was started on antibiotics after blood cultures were obtained.  Patient was wheezing on exam.  Review of Systems: As per HPI, rest all negative.   Past Medical History:  Diagnosis Date  . Abscess of tendon sheath, left ankle and foot 02/14/2014  . ADD (attention deficit disorder)   . Anemia   . Anemia   . Anxiety   . Arthritis    knee, wrist, back   . Asthma   . Blood transfusion 06/2011   6 units transfused at [facility]  . Bronchitis   . Depression   . Diverticulitis   . Dizziness   . ESRD (end stage renal disease) ([facility])    Hemodialysis 3 times a  week - [place]  . Gastritis   . GERD (gastroesophageal  reflux disease)   . Gout   . Headache(784.0)    migraines  . Hypertension    sees Dr. Wendie Agreste  . Neuromuscular disorder (HCC)    carpal tunnel  . Obesity   . Pneumonia   . PONV (postoperative nausea and vomiting)   . Reflux   . Renal insufficiency   . Shortness of breath   . Sleep apnea    does not use CPAP    Past Surgical History:  Procedure Laterality Date  . APPLICATION OF WOUND VAC Left 06/24/2017   Procedure: APPLICATION OF WOUND VAC;  Surgeon: Renette Butters, MD;  Location: [place];  Service: Orthopedics;  Laterality: Left;  . AV FISTULA PLACEMENT  06/10/11   Left brachiocephalic AVF  . AV Fistula surgery  11/30/11   [facility] - redo fistula  . Carpel tunnel release     x 2; bilateral  . CESAREAN SECTION    . colonscopy    . DILATION AND CURETTAGE OF UTERUS    . HYSTEROSCOPY WITH THERMACHOICE  12/10/2011   Procedure: HYSTEROSCOPY WITH THERMACHOICE;  Surgeon: Betsy Coder, MD;  Location: [facility];  Service: Gynecology;  Laterality: N/A;  . I&D EXTREMITY Left 06/12/2017   Procedure: IRRIGATION AND DEBRIDEMENT FOOT;  Surgeon: Marchia Bond, MD;  Location: [place];  Service: Orthopedics;  Laterality: Left;  . I&D EXTREMITY Left  06/14/2017   Procedure: IRRIGATION AND DEBRIDEMENT EXTREMITY/WOUND CLOSURE;  Surgeon: Renette Butters, MD;  Location: [place];  Service: Orthopedics;  Laterality: Left;  . I&D EXTREMITY Left 06/27/2017   Procedure: IRRIGATION AND DEBRIDEMENT LEFT TIBIA;  Surgeon: Renette Butters, MD;  Location: [place];  Service: Orthopedics;  Laterality: Left;  . INCISION AND DRAINAGE ABSCESS Left 06/24/2017   Procedure: INCISION AND DRAINAGE ABSCESS LEFT TIBIA AND APPLICATION OF WOUND VAC;  Surgeon: Renette Butters, MD;  Location: [place];  Service: Orthopedics;  Laterality: Left;  . IRRIGATION AND DEBRIDEMENT ABSCESS Left 02/15/2014   Procedure: IRRIGATION AND DRAINAGE OF LOW BACK ABSCESS;  Surgeon: Imogene Burn. Georgette Dover, MD;  Location: [place];  Service: General;   Laterality: Left;  . microdiskectomy  04/2001   Herniated disk L5-S1  . RENAL BIOPSY  04/2004  . REVISON OF ARTERIOVENOUS FISTULA Left 01/04/2017   Procedure: REVISON OF ARTERIOVENOUS FISTULA; CEPHALIC VEIN TURNDOWN;  Surgeon: Angelia Mould, MD;  Location: [place];  Service: Vascular;  Laterality: Left;  . REVISON OF ARTERIOVENOUS FISTULA Left 03/02/2018   Procedure: REVISION OF BRACHIOCEPHALIC ARTERIOVENOUS FISTULA LEFT UPPER ARM USING ARTEGRAFT;  Surgeon: Waynetta Sandy, MD;  Location: [place];  Service: Vascular;  Laterality: Left;  . SHUNTOGRAM  Dec. 18, 2013   Left arm  . SHUNTOGRAM N/A 10/18/2012   Procedure: Earney Mallet;  Surgeon: Conrad Malvern, MD;  Location: [facility] CATH LAB;  Service: Cardiovascular;  Laterality: N/A;  . SHUNTOGRAM Left 04/18/2013   Procedure: Earney Mallet;  Surgeon: Serafina Mitchell, MD;  Location: [facility] C[facility];  Service: Cardiovascular;  Laterality: Left;  . SPINE SURGERY  2004   Lumbar diskectomy   . UPPER GASTROINTESTINAL ENDOSCOPY    . WISDOM TOOTH EXTRACTION       reports that she quit smoking about 5 months ago. Her smoking use included cigarettes. She has a 9.00 pack-year smoking history. She has never used smokeless tobacco. She reports that she drank alcohol. She reports that she does not use drugs.  Allergies  Allergen Reactions  . Nsaids Other (See Comments)    Renal failure  . Tolmetin Other (See Comments)    Renal failure  . Clindamycin/Lincomycin Hives and Rash  . Demerol Nausea And Vomiting and Other (See Comments)    Severe headaches  . Fentanyl Hives, Nausea And Vomiting, Rash and Other (See Comments)    Reaction to patches only  . Other Other (See Comments)    Seeds and nuts- has diverticulitis   . Vancomycin Hives and Other (See Comments)    ? 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  . Clarithromycin Nausea And Vomiting  . Morphine And Related Nausea And Vomiting and Other (See Comments)    Severe headache, Pt does not want it!    Family History    Problem Relation Age of Onset  . Hypertension Mother   . Heart disease Father        Heart Disease before age 75  . Hypertension Father   . Heart attack Father   . Hypertension Daughter     Prior to Admission medications   Medication Sig Start Date End Date Taking? Authorizing Provider  acetaminophen (TYLENOL) 325 MG tablet Take 2 tablets (650 mg total) by mouth every 8 (eight) hours as needed for mild pain, fever or headache. 06/14/17  Yes Everrett Coombe, MD  albuterol (PROVENTIL HFA;VENTOLIN HFA) 108 (90 BASE) MCG/ACT inhaler Inhale 2 puffs into the lungs every 6 (six) hours as needed for shortness of breath.  Yes [provider]  ALPRAZolam Duanne Moron) 1 MG tablet Take 1 mg by mouth 3 (three) times daily.   Yes [provider]  amLODipine (NORVASC) 10 MG tablet Take 10 mg by mouth at bedtime.  02/08/14  Yes [provider]  budesonide-formoterol (SYMBICORT) 160-4.5 MCG/ACT inhaler Inhale 2 puffs into the lungs 2 (two) times daily.   Yes [provider]  buPROPion (WELLBUTRIN SR) 150 MG 12 hr tablet Take 150 mg by mouth daily. 04/15/17  Yes [provider]  calcium acetate (PHOSLO) 667 MG capsule Take 2,001 mg by mouth 2 (two) times daily with a meal.   Yes [provider]  cyclobenzaprine (FLEXERIL) 10 MG tablet Take 20 mg by mouth at bedtime.    Yes [provider]  HYDROmorphone (DILAUDID) 2 MG tablet Take 0.5 tablets (1 mg total) by mouth every 6 (six) hours as needed for moderate pain or severe pain. 06/28/17  Yes Sherene Sires, DO  lidocaine-prilocaine (EMLA) cream Apply 1 application topically See admin instructions. Apply topically one hour prior to dialysis - Monday, Wednesday, Friday   Yes [provider]  Melatonin 5 MG TABS Take 5 mg by mouth at bedtime.    Yes [provider]  metoprolol tartrate (LOPRESSOR) 25 MG tablet Take 1 tablet (25 mg total) by mouth 2 (two) times daily. 06/14/17  Yes Everrett Coombe, MD   montelukast (SINGULAIR) 10 MG tablet Take 10 mg by mouth daily.    Yes [provider]  multivitamin (RENA-VIT) TABS tablet Take 1 tablet by mouth daily.   Yes [provider]  nicotine (NICODERM CQ - DOSED IN MG/24 HOURS) 14 mg/24hr patch Place 1 patch (14 mg total) onto the skin daily. 06/28/17  Yes Sherene Sires, DO  oxyCODONE ER (XTAMPZA ER) 36 MG C12A Take 36 mg by mouth 2 (two) times daily.   Yes [provider]  sevelamer carbonate (RENVELA) 800 MG tablet Take 2,400 mg by mouth 2 (two) times daily with a meal.    Yes [provider]  SUMAtriptan (IMITREX) 50 MG tablet Take 50 mg by mouth daily as needed for migraine or headache.  02/14/14  Yes [provider]    Physical Exam: Vitals:   03/09/18 2030 03/09/18 2130 03/09/18 2230 03/09/18 2255  BP: (!) 153/87 (!) 165/92 (!) 162/96 (!) 169/94  Pulse: 86 90 83 83  Resp: (!) 38 (!) 22  15  Temp:    100 F (37.8 C)  TempSrc:    Oral  SpO2: 99% 96% 96% 93%  Weight:    62.6 kg (138 lb)  Height:    5\' 4"  (1.626 m)      Constitutional: Moderately built and nourished. Vitals:   03/09/18 2030 03/09/18 2130 03/09/18 2230 03/09/18 2255  BP: (!) 153/87 (!) 165/92 (!) 162/96 (!) 169/94  Pulse: 86 90 83 83  Resp: (!) 38 (!) 22  15  Temp:    100 F (37.8 C)  TempSrc:    Oral  SpO2: 99% 96% 96% 93%  Weight:    62.6 kg (138 lb)  Height:    5\' 4"  (1.626 m)   Eyes: Anicteric no pallor. ENMT: No discharge from the ears eyes nose or mouth. Neck: No mass felt.  No neck rigidity.  No JVD appreciated. Respiratory: Mild expiratory wheezes no crepitations. Cardiovascular: S1-S2 heard no murmurs appreciated. Abdomen: Soft nontender bowel sounds present.  No guarding or rigidity. Musculoskeletal: Left upper extremity swelling seen.  Extending up to  the shoulder from the hand.  No restriction of movement.  No active discharge seen over the AV fistula. Skin: No active discharge seen over the AV  fistula. Neurologic: Alert awake oriented to time place and person.  Moves all extremities. Psychiatric: Appears normal.  Normal affect.   Labs on Admission: I have personally reviewed following labs and imaging studies  CBC: Recent Labs  Lab 03/09/18 1605  WBC 4.4  NEUTROABS 3.1  HGB 10.1*  HCT 33.7*  MCV 100.6*  PLT 740*   Basic Metabolic Panel: Recent Labs  Lab 03/09/18 1605  NA 139  K 3.8  CL 97*  CO2 30  GLUCOSE 112*  BUN 25*  CREATININE 6.37*  CALCIUM 8.9   GFR: Estimated Creatinine Clearance: 9.6 mL/min (A) (by C-G formula based on SCr of 6.37 mg/dL (H)). Liver Function Tests: Recent Labs  Lab 03/09/18 1605  AST 29  ALT 5*  ALKPHOS 119  BILITOT 0.9  PROT 6.0*  ALBUMIN 3.5   No results for input(s): LIPASE, AMYLASE in the last 168 hours. No results for input(s): AMMONIA in the last 168 hours. Coagulation Profile: Recent Labs  Lab 03/09/18 1605  INR 1.08   Cardiac Enzymes: No results for input(s): CKTOTAL, CKMB, CKMBINDEX, TROPONINI in the last 168 hours. BNP (last 3 results) No results for input(s): PROBNP in the last 8760 hours. HbA1C: No results for input(s): HGBA1C in the last 72 hours. CBG: No results for input(s): GLUCAP in the last 168 hours. Lipid Profile: No results for input(s): CHOL, HDL, LDLCALC, TRIG, CHOLHDL, LDLDIRECT in the last 72 hours. Thyroid Function Tests: No results for input(s): TSH, T4TOTAL, FREET4, T3FREE, THYROIDAB in the last 72 hours. Anemia Panel: No results for input(s): VITAMINB12, FOLATE, FERRITIN, TIBC, IRON, RETICCTPCT in the last 72 hours. Urine analysis:    Component Value Date/Time   COLORURINE YELLOW 06/22/2016 0500   APPEARANCEUR CLEAR 06/22/2016 0500   LABSPEC 1.011 06/22/2016 0500   PHURINE 8.0 06/22/2016 0500   GLUCOSEU NEGATIVE 06/22/2016 0500   HGBUR NEGATIVE 06/22/2016 0500   BILIRUBINUR NEGATIVE 06/22/2016 0500   KETONESUR NEGATIVE 06/22/2016 0500   PROTEINUR 100 (A) 06/22/2016 0500    UROBILINOGEN 0.2 02/01/2015 0021   NITRITE NEGATIVE 06/22/2016 0500   LEUKOCYTESUR NEGATIVE 06/22/2016 0500   Sepsis Labs: @LABRCNTIP (procalcitonin:4,lacticidven:4) )No results found for this or any previous visit (from the past 240 hour(s)).   Radiological Exams on Admission: Dg Chest 2 View  Result Date: 03/09/2018 CLINICAL DATA:  Shortness of breath EXAM: CHEST - 2 VIEW COMPARISON:  None. FINDINGS: Unchanged mild cardiomegaly. Both lungs are clear. The visualized skeletal structures are unremarkable. IMPRESSION: Unchanged mild cardiomegaly without pulmonary edema. Electronically Signed   By: Ulyses Jarred M.D.   On: 03/09/2018 16:33   Ct Angio Chest Pe W And/or Wo Contrast  Result Date: 03/09/2018 CLINICAL DATA:  Dyspnea x2 days.  Patient had surgery 8 days ago. EXAM: CT ANGIOGRAPHY CHEST WITH CONTRAST TECHNIQUE: Multidetector CT imaging of the chest was performed using the standard protocol during bolus administration of intravenous contrast. Multiplanar CT image reconstructions and MIPs were obtained to evaluate the vascular anatomy. CONTRAST:  67 cc ISOVUE-370 IOPAMIDOL (ISOVUE-370) INJECTION 76% COMPARISON:  CXR 03/09/2018 FINDINGS: Cardiovascular: Cardiomegaly with left main and three-vessel coronary arteriosclerosis. No pericardial effusion. Conventional branch pattern of the great vessels without significant stenosis. No aortic aneurysm. No acute pulmonary embolus to the segmental level. Mediastinum/Nodes: Mildly enlarged aorticopulmonary, paratracheal, subcarinal and bilateral hilar lymph nodes. Lungs/Pleura: Ill-defined, somewhat rounded acinar opacities/nodules  are seen bilaterally, multilobar in appearance, greatest in the left upper lobe, lingula and left lower lobe with lesser degrees in the right lower lobe and right upper lobe. These more likely represent stigmata of bronchopneumonia though can be seen in the setting of aspiration, vasculitis or less likely neoplasm. No effusion or  pneumothorax. Upper Abdomen: No acute abnormality. Musculoskeletal: No chest wall abnormality. No acute or significant osseous findings. Review of the MIP images confirms the above findings. IMPRESSION: 1. Multilobar acinar opacities more commonly associated with bronchopneumonia. Followup PA and lateral chest X-ray is recommended in 3-4 weeks following trial of antibiotic therapy to ensure resolution and exclude underlying malignancy. 2. Likely mild mediastinal and bilateral reactive adenopathy. 3. Coronary arteriosclerosis. 4. No acute pulmonary embolus. Electronically Signed   By: Ashley Royalty M.D.   On: 03/09/2018 19:51    EKG: Independently reviewed.  Normal sinus rhythm.  Nonspecific ST-T changes.  Assessment/Plan Principal Problem:   HCAP (healthcare-associated pneumonia) Active Problems:   HTN (hypertension)   Anemia   ESRD on dialysis ([place])   Left upper extremity swelling    1. Acute respiratory failure with hypoxia secondary to pneumonia and bronchitis -patient placed on empiric antibiotics for healthcare associated pneumonia.  Check influenza PCR urine for Legionella strep antigen sputum cultures and blood cultures.  Patient is placed on nebulizer treatment and Pulmicort for wheezing.  Pleuritic chest pain likely from pneumonia.  Check cardiac markers. 2. Left upper extremity swelling with pain around the AV fistula area -Dopplers are pending.  Will need to consult vascular surgeon.  No active discharge seen around AV fistula.  Patient on empiric antibiotics for now. Addendum -discussed with vascular surgeon Dr. Donzetta Matters.  Dr. Donzetta Matters stated that patient was already seen in the office yesterday there was concern for infection.  Dr. Donzetta Matters will be seeing patient in consult.  3. Thrombocytopenia appears to be new.  If there is any further worsening will need hemolytic work-up.  Closely follow CBC with differential. 4. Hypertension uncontrolled -in addition to patient's home medications including  metoprolol amlodipine I have placed patient on PRN IV hydralazine.  Patient states she also takes p.o. hydralazine along with lisinopril but does not remember the dose.  Will need to confirm with patient's nephrologist in a.m. 5. ESRD on hemodialysis -Monday Wednesday and Friday.  Please consult nephrologist for dialysis.  Not in fluid overload. 6. Anemia likely from ESRD.  Follow CBC. 7. History of sleep apnea per the chart.  Patient does not use CPAP.   DVT prophylaxis: Heparin.  Follow CBC. Code Status: Full code. Family Communication: Discussed with patient's husband. Disposition Plan: Home. Consults called: None. Admission status: Inpatient.   Rise Patience MD [facility] Pager [phone].  If 7PM-7AM, please contact night-coverage [email or web address] Password [email or web address]  03/09/2018, 11:20 PM

## 2018-03-09 NOTE — Progress Notes (Signed)
CC: Fever and chills, cough and dyspnea x 2 days; left arm swelling and pain since 03-02-18 with hx of AVF, ESRD  History of Present Illness  [name] is a 47 y.o. (04-Mar-1971) female who is status post left cephalic vein turndown and revision of left brachiocephalic AV fistula by Dr. Scot Dock in March 2018.  The patient felt strongly about salvaging her left arm fistula despite recommendations to create new permanent access in right arm and ligate left arm fistula due to the widespread aneurysmal dilatation.  Dr. Nicole Cella plan was to complete cephalic vein turn down then  plicate the aneurysmal area near her AC fossa at a later date.    She is also s/p revision of LUA AVF with 6 mm Artegraft on 03-02-18 by Dr. Donzetta Matters.   She has an appointment on 03-24-18 with Dr. Donzetta Matters for 2-3 week post op follow up.   She returns today with c/o swelling, pain in left arm since the 03-02-18 surgery. Cough started a couple of days ago with dyspnea., reports fever and chills for a couple of days.   She states she elevates left arm above her heart for a couple of hours daily.   She dialyzes M-W-F via left upper arm AVF.   She attends [employer] in [place] for chronic back pain.  She quit smoking on 10-01-17, smoked x 18 years. He husband smokes in their house.    Past Medical History:  Diagnosis Date  . Abscess of tendon sheath, left ankle and foot 02/14/2014  . ADD (attention deficit disorder)   . Anemia   . Anemia   . Anxiety   . Arthritis    knee, wrist, back   . Asthma   . Blood transfusion 06/2011   6 units transfused at [facility]  . Bronchitis   . Depression   . Diverticulitis   . Dizziness   . ESRD (end stage renal disease) ([place])    Hemodialysis 3 times a  week - [place]  . Gastritis   . GERD (gastroesophageal reflux disease)   . Gout   . Headache(784.0)    migraines  . Hypertension    sees Dr. Wendie Agreste  . Neuromuscular disorder (HCC)    carpal tunnel  .  Obesity   . Pneumonia   . PONV (postoperative nausea and vomiting)   . Reflux   . Renal insufficiency   . Shortness of breath   . Sleep apnea    does not use CPAP    Social History Social History   Tobacco Use  . Smoking status: Former Smoker    Packs/day: 0.50    Years: 18.00    Pack years: 9.00    Types: Cigarettes    Last attempt to quit: 10/01/2017    Years since quitting: 0.4  . Smokeless tobacco: Never Used  Substance Use Topics  . Alcohol use: Not Currently    Comment: occasional  . Drug use: No    Family History Family History  Problem Relation Age of Onset  . Hypertension Mother   . Heart disease Father        Heart Disease before age 5  . Hypertension Father   . Heart attack Father   . Hypertension Daughter     Surgical History Past Surgical History:  Procedure Laterality Date  . APPLICATION OF WOUND VAC Left 06/24/2017   Procedure: APPLICATION OF WOUND VAC;  Surgeon: Renette Butters, MD;  Location: [place];  Service: Orthopedics;  Laterality: Left;  . AV FISTULA PLACEMENT  06/10/11   Left brachiocephalic AVF  . AV Fistula surgery  11/30/11   [facility] - redo fistula  . Carpel tunnel release     x 2; bilateral  . CESAREAN SECTION    . colonscopy    . DILATION AND CURETTAGE OF UTERUS    . HYSTEROSCOPY WITH THERMACHOICE  12/10/2011   Procedure: HYSTEROSCOPY WITH THERMACHOICE;  Surgeon: Betsy Coder, MD;  Location: [facility];  Service: Gynecology;  Laterality: N/A;  . I&D EXTREMITY Left 06/12/2017   Procedure: IRRIGATION AND DEBRIDEMENT FOOT;  Surgeon: Marchia Bond, MD;  Location: [place];  Service: Orthopedics;  Laterality: Left;  . I&D EXTREMITY Left 06/14/2017   Procedure: IRRIGATION AND DEBRIDEMENT EXTREMITY/WOUND CLOSURE;  Surgeon: Renette Butters, MD;  Location: [place];  Service: Orthopedics;  Laterality: Left;  . I&D EXTREMITY Left 06/27/2017   Procedure: IRRIGATION AND DEBRIDEMENT LEFT TIBIA;  Surgeon: Renette Butters, MD;  Location: [place];   Service: Orthopedics;  Laterality: Left;  . INCISION AND DRAINAGE ABSCESS Left 06/24/2017   Procedure: INCISION AND DRAINAGE ABSCESS LEFT TIBIA AND APPLICATION OF WOUND VAC;  Surgeon: Renette Butters, MD;  Location: [place];  Service: Orthopedics;  Laterality: Left;  . IRRIGATION AND DEBRIDEMENT ABSCESS Left 02/15/2014   Procedure: IRRIGATION AND DRAINAGE OF LOW BACK ABSCESS;  Surgeon: Imogene Burn. Georgette Dover, MD;  Location: [place];  Service: General;  Laterality: Left;  . microdiskectomy  04/2001   Herniated disk L5-S1  . RENAL BIOPSY  04/2004  . REVISON OF ARTERIOVENOUS FISTULA Left 01/04/2017   Procedure: REVISON OF ARTERIOVENOUS FISTULA; CEPHALIC VEIN TURNDOWN;  Surgeon: Angelia Mould, MD;  Location: [place];  Service: Vascular;  Laterality: Left;  . REVISON OF ARTERIOVENOUS FISTULA Left 03/02/2018   Procedure: REVISION OF BRACHIOCEPHALIC ARTERIOVENOUS FISTULA LEFT UPPER ARM USING ARTEGRAFT;  Surgeon: Waynetta Sandy, MD;  Location: [place];  Service: Vascular;  Laterality: Left;  . SHUNTOGRAM  Dec. 18, 2013   Left arm  . SHUNTOGRAM N/A 10/18/2012   Procedure: Earney Mallet;  Surgeon: Conrad Libby, MD;  Location: [facility] CATH LAB;  Service: Cardiovascular;  Laterality: N/A;  . SHUNTOGRAM Left 04/18/2013   Procedure: Earney Mallet;  Surgeon: Serafina Mitchell, MD;  Location: [facility] CATH LAB;  Service: Cardiovascular;  Laterality: Left;  . SPINE SURGERY  2004   Lumbar diskectomy   . UPPER GASTROINTESTINAL ENDOSCOPY    . WISDOM TOOTH EXTRACTION      Allergies  Allergen Reactions  . Nsaids Other (See Comments)    Renal failure  . Clindamycin/Lincomycin Hives and Rash  . Demerol Nausea And Vomiting and Other (See Comments)    Severe headaches  . Fentanyl Hives, Nausea And Vomiting, Rash and Other (See Comments)    Reaction to patches only  . Other Other (See Comments)    Seeds and nuts- has diverticulitis   . Vancomycin Hives and Other (See Comments)    ? 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  . Clarithromycin Nausea  And Vomiting  . Morphine And Related Nausea And Vomiting and Other (See Comments)    Severe headache, Pt does not want it!    Current Outpatient Medications  Medication Sig Dispense Refill  . acetaminophen (TYLENOL) 325 MG tablet Take 2 tablets (650 mg total) by mouth every 8 (eight) hours as needed for mild pain, fever or headache. 30 tablet 0  . albuterol (PROVENTIL HFA;VENTOLIN HFA) 108 (90 BASE) MCG/ACT inhaler Inhale 2 puffs into the lungs  every 6 (six) hours as needed for shortness of breath.     . ALPRAZolam (XANAX) 1 MG tablet Take 1 mg by mouth 3 (three) times daily.    Marland Kitchen amLODipine (NORVASC) 10 MG tablet Take 10 mg by mouth at bedtime.     . budesonide-formoterol (SYMBICORT) 160-4.5 MCG/ACT inhaler Inhale 2 puffs into the lungs 2 (two) times daily.    Marland Kitchen buPROPion (WELLBUTRIN SR) 150 MG 12 hr tablet Take 150 mg by mouth daily.    . calcium acetate (PHOSLO) 667 MG capsule Take 2,001 mg by mouth 2 (two) times daily with a meal.    . cyclobenzaprine (FLEXERIL) 10 MG tablet Take 20 mg by mouth at bedtime.     Marland Kitchen HYDROmorphone (DILAUDID) 2 MG tablet Take 0.5 tablets (1 mg total) by mouth every 6 (six) hours as needed for moderate pain or severe pain. 14 tablet 0  . lidocaine-prilocaine (EMLA) cream Apply 1 application topically See admin instructions. Apply topically one hour prior to dialysis - Monday, Wednesday, Friday    . Melatonin 5 MG TABS Take 5 mg by mouth at bedtime.     . metoprolol tartrate (LOPRESSOR) 25 MG tablet Take 1 tablet (25 mg total) by mouth 2 (two) times daily. 60 tablet 0  . montelukast (SINGULAIR) 10 MG tablet Take 10 mg by mouth daily.     . multivitamin (RENA-VIT) TABS tablet Take 1 tablet by mouth daily.    . nicotine (NICODERM CQ - DOSED IN MG/24 HOURS) 14 mg/24hr patch Place 1 patch (14 mg total) onto the skin daily. 28 patch 0  . oxyCODONE ER (XTAMPZA ER) 36 MG C12A Take 36 mg by mouth 2 (two) times daily.    . sevelamer carbonate (RENVELA) 800 MG tablet Take  2,400 mg by mouth 2 (two) times daily with a meal.     . SUMAtriptan (IMITREX) 50 MG tablet Take 50 mg by mouth daily as needed for migraine or headache.      No current facility-administered medications for this visit.      REVIEW OF SYSTEMS: see HPI for pertinent positives and negatives    PHYSICAL EXAMINATION:  Vitals:   03/09/18 1436 03/09/18 1437  BP: (!) 184/99 (!) 185/97  Pulse: 87   Resp: 18   Temp: (!) 102.1 F (38.9 C)   TempSrc: Oral   SpO2: 91%   Weight: 137 lb (62.1 kg)   Height: 5\' 4"  (1.626 m)    Body mass index is 23.52 kg/m.  General: The patient appears her stated age.   HEENT:  No gross abnormalities Pulmonary: Respirations are slightly labored at rest, has fairly frequent non productive cough. + inspiratory and expiratory wheezes in all fields, no rales or rhonchi, limited air movement in all fields.  Abdomen: Soft and non-tender with normal pitched bowel sounds.  Musculoskeletal: There are no major deformities.   Neurologic: Left hand grip is 4/5, right is 5/5. Diminished sensation to touch in left hand compared to right.  Skin: There are no ulcer or rashes noted. Psychiatric: The patient has normal affect. Cardiovascular: There is a regular rate and rhythm without significant murmur appreciated.  Left radial pulse is 1+ palpable, right is 2+ palpable.  1-2+ non pitting edema in left hand and arm. Mild aneurysm dilation of left upper arm AVF.  Entire left upper extremity is painful to touch.     Medical Decision Making  [name] is a 47 y.o. female who is s/p revision of LUA  AVF with 6 mm Artegraft on 03-02-18 by Dr. Donzetta Matters.    Dr. Oneida Alar spoke with pt and husband, and examined pt.   I spoke with the charge nurse at [facility] ED re pt HPI, physical exam, suspicion for pneumonia and/or infection of left upper arm AV fistula.  Pt taken to [facility] ED by private vehicle, her husband drove her.    Clemon Chambers, RN, MSN, FNP-C [facility] [place] Office: [phone]  03/09/2018, 2:47 PM  Clinic MD: Oneida Alar

## 2018-03-09 NOTE — ED Provider Notes (Signed)
[facility] EMERGENCY DEPARTMENT Provider Note   CSN: [id number] Arrival date & time: 03/09/18  1533     History   Chief Complaint Chief Complaint  Patient presents with  . Vascular Access Problem    HPI [name] is a 47 y.o. female.  HPI Patient is a 47 year old female with a past medical history of ESRD currently on dialysis, last dialyzed yesterday who comes in today complaining of 2 days of shortness of breath as well as left-sided chest pain made worse by coughing.  Patient also endorses swelling and tenderness to the left upper extremity which she states has been present since her fistula graft was placed on the second of this month.  Patient endorses fevers and chills for the past 2 days as well as nausea and vomiting but states this is her baseline.  Patient denies any numbness or weakness.  Endorses tenderness about her fistula graft site as well as some drainage from the surgical incisions. Past Medical History:  Diagnosis Date  . Abscess of tendon sheath, left ankle and foot 02/14/2014  . ADD (attention deficit disorder)   . Anemia   . Anemia   . Anxiety   . Arthritis    knee, wrist, back   . Asthma   . Blood transfusion 06/2011   6 units transfused at [facility]  . Bronchitis   . Depression   . Diverticulitis   . Dizziness   . ESRD (end stage renal disease) ([place])    Hemodialysis 3 times a  week - [place]  . Gastritis   . GERD (gastroesophageal reflux disease)   . Gout   . Headache(784.0)    migraines  . Hypertension    sees Dr. Wendie Agreste  . Neuromuscular disorder (HCC)    carpal tunnel  . Obesity   . Pneumonia   . PONV (postoperative nausea and vomiting)   . Reflux   . Renal insufficiency   . Shortness of breath   . Sleep apnea    does not use CPAP    Patient Active Problem List   Diagnosis Date Noted  . HCAP (healthcare-associated pneumonia) 03/09/2018  . Left upper extremity swelling 03/09/2018  . Cellulitis  06/23/2017  . Chronic pain syndrome   . Cellulitis of left lower extremity   . Tenosynovitis of ankle   . Heart murmur   . Sepsis ([place]) 02/15/2014  . Abscess of tendon sheath, left ankle and foot 02/14/2014  . Thinning of skin 04/09/2013  . Mechanical complication of other vascular device, implant, and graft 04/09/2013  . Pain, limb, left 10/18/2012  . Swollen 10/18/2012  . Status post endometrial ablation 12/10/2011  . ESRD on dialysis ([place]) 11/24/2011  . Renal failure (ARF), acute on chronic (HCC) 10/19/2011  . HTN (hypertension) 10/19/2011  . Anemia 10/19/2011    Past Surgical History:  Procedure Laterality Date  . APPLICATION OF WOUND VAC Left 06/24/2017   Procedure: APPLICATION OF WOUND VAC;  Surgeon: Renette Butters, MD;  Location: [facility];  Service: Orthopedics;  Laterality: Left;  . AV FISTULA PLACEMENT  06/10/11   Left brachiocephalic AVF  . AV Fistula surgery  11/30/11   [facility] - redo fistula  . Carpel tunnel release     x 2; bilateral  . CESAREAN SECTION    . colonscopy    . DILATION AND CURETTAGE OF UTERUS    . HYSTEROSCOPY WITH THERMACHOICE  12/10/2011   Procedure: HYSTEROSCOPY WITH THERMACHOICE;  Surgeon: Betsy Coder,  MD;  Location: [facility];  Service: Gynecology;  Laterality: N/A;  . I&D EXTREMITY Left 06/12/2017   Procedure: IRRIGATION AND DEBRIDEMENT FOOT;  Surgeon: Marchia Bond, MD;  Location: [facility];  Service: Orthopedics;  Laterality: Left;  . I&D EXTREMITY Left 06/14/2017   Procedure: IRRIGATION AND DEBRIDEMENT EXTREMITY/WOUND CLOSURE;  Surgeon: Renette Butters, MD;  Location: [place] [facility];  Service: Orthopedics;  Laterality: Left;  . I&D EXTREMITY Left 06/27/2017   Procedure: IRRIGATION AND DEBRIDEMENT LEFT TIBIA;  Surgeon: Renette Butters, MD;  Location: [facility];  Service: Orthopedics;  Laterality: Left;  . INCISION AND DRAINAGE ABSCESS Left 06/24/2017   Procedure: INCISION AND DRAINAGE ABSCESS LEFT TIBIA AND APPLICATION OF WOUND VAC;  Surgeon: Renette Butters, MD;  Location: [facility];  Service: Orthopedics;  Laterality: Left;  . IRRIGATION AND DEBRIDEMENT ABSCESS Left 02/15/2014   Procedure: IRRIGATION AND DRAINAGE OF LOW BACK ABSCESS;  Surgeon: Imogene Burn. Georgette Dover, MD;  Location: [place];  Service: General;  Laterality: Left;  . microdiskectomy  04/2001   Herniated disk L5-S1  . RENAL BIOPSY  04/2004  . REVISON OF ARTERIOVENOUS FISTULA Left 01/04/2017   Procedure: REVISON OF ARTERIOVENOUS FISTULA; CEPHALIC VEIN TURNDOWN;  Surgeon: Angelia Mould, MD;  Location: [place];  Service: Vascular;  Laterality: Left;  . REVISON OF ARTERIOVENOUS FISTULA Left 03/02/2018   Procedure: REVISION OF BRACHIOCEPHALIC ARTERIOVENOUS FISTULA LEFT UPPER ARM USING ARTEGRAFT;  Surgeon: Waynetta Sandy, MD;  Location: [place];  Service: Vascular;  Laterality: Left;  . SHUNTOGRAM  Dec. 18, 2013   Left arm  . SHUNTOGRAM N/A 10/18/2012   Procedure: Earney Mallet;  Surgeon: Conrad North Sea, MD;  Location: [facility] CATH LAB;  Service: Cardiovascular;  Laterality: N/A;  . SHUNTOGRAM Left 04/18/2013   Procedure: Earney Mallet;  Surgeon: Serafina Mitchell, MD;  Location: [facility] CATH LAB;  Service: Cardiovascular;  Laterality: Left;  . SPINE SURGERY  2004   Lumbar diskectomy   . UPPER GASTROINTESTINAL ENDOSCOPY    . WISDOM TOOTH EXTRACTION       OB History   None      Home Medications    Prior to Admission medications   Medication Sig Start Date End Date Taking? Authorizing Provider  acetaminophen (TYLENOL) 325 MG tablet Take 2 tablets (650 mg total) by mouth every 8 (eight) hours as needed for mild pain, fever or headache. 06/14/17  Yes Everrett Coombe, MD  albuterol (PROVENTIL HFA;VENTOLIN HFA) 108 (90 BASE) MCG/ACT inhaler Inhale 2 puffs into the lungs every 6 (six) hours as needed for shortness of breath.    Yes [provider]  ALPRAZolam Duanne Moron) 1 MG tablet Take 1 mg by mouth 3 (three) times daily.   Yes [provider]  amLODipine (NORVASC) 10 MG tablet Take 10 mg by  mouth at bedtime.  02/08/14  Yes [provider]  budesonide-formoterol (SYMBICORT) 160-4.5 MCG/ACT inhaler Inhale 2 puffs into the lungs 2 (two) times daily.   Yes [provider]  buPROPion (WELLBUTRIN SR) 150 MG 12 hr tablet Take 150 mg by mouth daily. 04/15/17  Yes [provider]  calcium acetate (PHOSLO) 667 MG capsule Take 2,001 mg by mouth 2 (two) times daily with a meal.   Yes [provider]  cyclobenzaprine (FLEXERIL) 10 MG tablet Take 20 mg by mouth at bedtime.    Yes [provider]  HYDROmorphone (DILAUDID) 2 MG tablet Take 0.5 tablets (1 mg total) by mouth every 6 (six) hours as needed for moderate pain or  severe pain. 06/28/17  Yes Sherene Sires, DO  lidocaine-prilocaine (EMLA) cream Apply 1 application topically See admin instructions. Apply topically one hour prior to dialysis - Monday, Wednesday, Friday   Yes [provider]  Melatonin 5 MG TABS Take 5 mg by mouth at bedtime.    Yes [provider]  metoprolol tartrate (LOPRESSOR) 25 MG tablet Take 1 tablet (25 mg total) by mouth 2 (two) times daily. 06/14/17  Yes Everrett Coombe, MD  montelukast (SINGULAIR) 10 MG tablet Take 10 mg by mouth daily.    Yes [provider]  multivitamin (RENA-VIT) TABS tablet Take 1 tablet by mouth daily.   Yes [provider]  nicotine (NICODERM CQ - DOSED IN MG/24 HOURS) 14 mg/24hr patch Place 1 patch (14 mg total) onto the skin daily. 06/28/17  Yes Sherene Sires, DO  oxyCODONE ER (XTAMPZA ER) 36 MG C12A Take 36 mg by mouth 2 (two) times daily.   Yes [provider]  sevelamer carbonate (RENVELA) 800 MG tablet Take 2,400 mg by mouth 2 (two) times daily with a meal.    Yes [provider]  SUMAtriptan (IMITREX) 50 MG tablet Take 50 mg by mouth daily as needed for migraine or headache.  02/14/14  Yes [provider]    Family History Family History  Problem Relation Age of Onset  . Hypertension Mother    . Heart disease Father        Heart Disease before age 31  . Hypertension Father   . Heart attack Father   . Hypertension Daughter     Social History Social History   Tobacco Use  . Smoking status: Former Smoker    Packs/day: 0.50    Years: 18.00    Pack years: 9.00    Types: Cigarettes    Last attempt to quit: 10/01/2017    Years since quitting: 0.4  . Smokeless tobacco: Never Used  Substance Use Topics  . Alcohol use: Not Currently    Comment: occasional  . Drug use: No     Allergies   Nsaids; Tolmetin; Clindamycin/lincomycin; Demerol; Fentanyl; Other; Vancomycin; Clarithromycin; and Morphine and related   Review of Systems Review of Systems  Constitutional: Positive for chills, fatigue and fever.  Respiratory: Positive for cough, chest tightness and shortness of breath.   Cardiovascular: Positive for chest pain and leg swelling.  Gastrointestinal: Positive for nausea and vomiting.  Neurological: Negative for weakness and numbness.  All other systems reviewed and are negative.    Physical Exam Updated Vital Signs BP (!) 161/84   Pulse 82   Temp 100 F (37.8 C) (Oral)   Resp 15   Ht 5\' 4"  (1.626 m)   Wt 62.6 kg (138 lb)   LMP 06/27/2014   SpO2 98%   BMI 23.69 kg/m   Physical Exam  Constitutional: She appears well-developed and well-nourished. No distress.  HENT:  Head: Normocephalic and atraumatic.  Eyes: Conjunctivae are normal.  Neck: Neck supple.  Cardiovascular: Normal rate and regular rhythm.  No murmur heard. Pulmonary/Chest: Effort normal and breath sounds normal. No respiratory distress.  Abdominal: Soft. There is no tenderness.  Musculoskeletal: She exhibits edema and tenderness.  Diffuse swelling of left upper extremity with palpable thrill of her graft large varices, tenderness to palpation in the area over and surrounding graft site.  Surgical incisions are erythematous with clear drainage.  Neurological: She is alert.  Skin: Skin is  warm and dry.  Psychiatric: She has a normal mood and  affect.  Nursing note and vitals reviewed.    ED Treatments / Results  Labs (all labs ordered are listed, but only abnormal results are displayed) Labs Reviewed  COMPREHENSIVE METABOLIC PANEL - Abnormal; Notable for the following components:      Result Value   Chloride 97 (*)    Glucose, Bld 112 (*)    BUN 25 (*)    Creatinine, Ser 6.37 (*)    Total Protein 6.0 (*)    ALT 5 (*)    GFR calc non Af Amer 7 (*)    GFR calc Af Amer 8 (*)    All other components within normal limits  CBC WITH DIFFERENTIAL/PLATELET - Abnormal; Notable for the following components:   RBC 3.35 (*)    Hemoglobin 10.1 (*)    HCT 33.7 (*)    MCV 100.6 (*)    RDW 15.7 (*)    Platelets 106 (*)    All other components within normal limits  URINALYSIS, ROUTINE W REFLEX MICROSCOPIC - Abnormal; Notable for the following components:   pH 9.0 (*)    Bilirubin Urine MODERATE (*)    Protein, ur >=300 (*)    Bacteria, UA RARE (*)    All other components within normal limits  CULTURE, BLOOD (ROUTINE X 2)  CULTURE, BLOOD (ROUTINE X 2)  MRSA PCR SCREENING  CULTURE, EXPECTORATED SPUTUM-ASSESSMENT  GRAM STAIN  PROTIME-INR  STREP PNEUMONIAE URINARY ANTIGEN  BASIC METABOLIC PANEL  CBC  INFLUENZA PANEL BY PCR (TYPE A & B)  LEGIONELLA PNEUMOPHILA SEROGP 1 UR AG  I-STAT CG4 LACTIC ACID, ED  I-STAT BETA HCG BLOOD, ED ([facility], [facility], [facility] ONLY)  I-STAT TROPONIN, ED  I-STAT CG4 LACTIC ACID, ED    EKG EKG Interpretation  Date/Time:  Thursday Mar 09 2018 15:42:35 EDT Ventricular Rate:  88 PR Interval:  200 QRS Duration: 90 QT Interval:  364 QTC Calculation: 440 R Axis:   -34 Text Interpretation:  Normal sinus rhythm Left axis deviation Abnormal QRS-T angle, consider primary T wave abnormality Abnormal ECG Confirmed by Lacretia Leigh ([id number]) on 03/09/2018 4:53:35 PM   Radiology Dg Chest 2 View  Result Date: 03/09/2018 CLINICAL DATA:  Shortness of breath EXAM:  CHEST - 2 VIEW COMPARISON:  None. FINDINGS: Unchanged mild cardiomegaly. Both lungs are clear. The visualized skeletal structures are unremarkable. IMPRESSION: Unchanged mild cardiomegaly without pulmonary edema. Electronically Signed   By: Ulyses Jarred M.D.   On: 03/09/2018 16:33   Ct Angio Chest Pe W And/or Wo Contrast  Result Date: 03/09/2018 CLINICAL DATA:  Dyspnea x2 days.  Patient had surgery 8 days ago. EXAM: CT ANGIOGRAPHY CHEST WITH CONTRAST TECHNIQUE: Multidetector CT imaging of the chest was performed using the standard protocol during bolus administration of intravenous contrast. Multiplanar CT image reconstructions and MIPs were obtained to evaluate the vascular anatomy. CONTRAST:  67 cc ISOVUE-370 IOPAMIDOL (ISOVUE-370) INJECTION 76% COMPARISON:  CXR 03/09/2018 FINDINGS: Cardiovascular: Cardiomegaly with left main and three-vessel coronary arteriosclerosis. No pericardial effusion. Conventional branch pattern of the great vessels without significant stenosis. No aortic aneurysm. No acute pulmonary embolus to the segmental level. Mediastinum/Nodes: Mildly enlarged aorticopulmonary, paratracheal, subcarinal and bilateral hilar lymph nodes. Lungs/Pleura: Ill-defined, somewhat rounded acinar opacities/nodules are seen bilaterally, multilobar in appearance, greatest in the left upper lobe, lingula and left lower lobe with lesser degrees in the right lower lobe and right upper lobe. These more likely represent stigmata of bronchopneumonia though can be seen in the setting of aspiration, vasculitis or less likely neoplasm.  No effusion or pneumothorax. Upper Abdomen: No acute abnormality. Musculoskeletal: No chest wall abnormality. No acute or significant osseous findings. Review of the MIP images confirms the above findings. IMPRESSION: 1. Multilobar acinar opacities more commonly associated with bronchopneumonia. Followup PA and lateral chest X-ray is recommended in 3-4 weeks following trial of  antibiotic therapy to ensure resolution and exclude underlying malignancy. 2. Likely mild mediastinal and bilateral reactive adenopathy. 3. Coronary arteriosclerosis. 4. No acute pulmonary embolus. Electronically Signed   By: Ashley Royalty M.D.   On: 03/09/2018 19:51    Procedures Procedures (including critical care time)  Medications Ordered in ED Medications  iopamidol (ISOVUE-370) 76 % injection (has no administration in time range)  hydrALAZINE (APRESOLINE) injection 10 mg (10 mg Intravenous Given 03/09/18 2307)  HYDROmorphone (DILAUDID) injection 0.5 mg (0.5 mg Intravenous Given 03/09/18 2225)  ALPRAZolam (XANAX) tablet 1 mg (has no administration in time range)  amLODipine (NORVASC) tablet 10 mg (10 mg Oral Given 03/09/18 2350)  mometasone-formoterol (DULERA) 200-5 MCG/ACT inhaler 2 puff (2 puffs Inhalation Not Given 03/10/18 0000)  buPROPion (WELLBUTRIN SR) 12 hr tablet 150 mg (has no administration in time range)  calcium acetate (PHOSLO) capsule 2,001 mg (has no administration in time range)  cyclobenzaprine (FLEXERIL) tablet 20 mg (20 mg Oral Given 03/09/18 2350)  metoprolol tartrate (LOPRESSOR) tablet 25 mg (25 mg Oral Given 03/09/18 2351)  montelukast (SINGULAIR) tablet 10 mg (has no administration in time range)  multivitamin (RENA-VIT) tablet 1 tablet (1 tablet Oral Given 03/09/18 2351)  sevelamer carbonate (RENVELA) tablet 2,400 mg (has no administration in time range)  acetaminophen (TYLENOL) tablet 650 mg (650 mg Oral Given 03/09/18 2351)    Or  acetaminophen (TYLENOL) suppository 650 mg ( Rectal See Alternative 03/09/18 2351)  ondansetron (ZOFRAN) tablet 4 mg (has no administration in time range)    Or  ondansetron (ZOFRAN) injection 4 mg (has no administration in time range)  heparin injection 5,000 Units (5,000 Units Subcutaneous Not Given 03/09/18 2352)  ipratropium-albuterol (DUONEB) 0.5-2.5 (3) MG/3ML nebulizer solution 3 mL (3 mLs Nebulization Given 03/09/18 2356)  ipratropium-albuterol  (DUONEB) 0.5-2.5 (3) MG/3ML nebulizer solution 3 mL (has no administration in time range)  acetaminophen (TYLENOL) tablet 650 mg (650 mg Oral Given 03/09/18 1552)  vancomycin (VANCOCIN) IVPB 1000 mg/200 mL premix (0 mg Intravenous Stopped 03/09/18 1912)  iopamidol (ISOVUE-370) 76 % injection 100 mL (100 mLs Intravenous Contrast Given 03/09/18 1923)  ondansetron (ZOFRAN) injection 4 mg (4 mg Intravenous Given 03/09/18 2311)     Initial Impression / Assessment and Plan / ED Course  I have reviewed the triage vital signs and the nursing notes.  Pertinent labs & imaging results that were available during my care of the patient were reviewed by me and considered in my medical decision making (see chart for details).    Patient's laboratory work-up reveals multiple electrolyte abnormalities, however none to the severity requiring dialysis.  Concern for possible PE given patient's chest pain shortness of breath, CTA collected which revealed no evidence of pulmonary embolus but showed pneumonia.  Patient had previously had blood cultures drawn and given appropriate antibiotics.  Left upper extremity shows fistula graft with palpable thrill, however there is diffuse swelling with large varices.  Ultrasound of left upper extremity ordered, however not completed at this time.  But given patient's current clinical condition, will admit patient to the hospitalist for further evaluation and care   Final Clinical Impressions(s) / ED Diagnoses   Final diagnoses:  None  ED Discharge Orders    None       Chapman Moss, MD 03/10/18 [phone]

## 2018-03-09 NOTE — ED Triage Notes (Addendum)
Pt states she had dialysis fistula surgery on May 2nd on her left arm. She has been a dialysis pt for 6 years and had previous fistulas in the left arm.  She has been running fevers, SOB, had arm swelling and a lot of pain in that arm for the past few days. No drainage from the site.

## 2018-03-10 ENCOUNTER — Inpatient Hospital Stay (HOSPITAL_COMMUNITY): Payer: Medicare Other

## 2018-03-10 DIAGNOSIS — J189 Pneumonia, unspecified organism: Secondary | ICD-10-CM

## 2018-03-10 DIAGNOSIS — R609 Edema, unspecified: Secondary | ICD-10-CM

## 2018-03-10 DIAGNOSIS — M7989 Other specified soft tissue disorders: Secondary | ICD-10-CM

## 2018-03-10 LAB — CBC
HEMATOCRIT: 30.8 % — AB (ref 36.0–46.0)
Hemoglobin: 9.3 g/dL — ABNORMAL LOW (ref 12.0–15.0)
MCH: 30.4 pg (ref 26.0–34.0)
MCHC: 30.2 g/dL (ref 30.0–36.0)
MCV: 100.7 fL — ABNORMAL HIGH (ref 78.0–100.0)
Platelets: 106 10*3/uL — ABNORMAL LOW (ref 150–400)
RBC: 3.06 MIL/uL — AB (ref 3.87–5.11)
RDW: 15.6 % — ABNORMAL HIGH (ref 11.5–15.5)
WBC: 4 10*3/uL (ref 4.0–10.5)

## 2018-03-10 LAB — INFLUENZA PANEL BY PCR (TYPE A & B)
INFLBPCR: NEGATIVE
Influenza A By PCR: NEGATIVE

## 2018-03-10 LAB — BASIC METABOLIC PANEL
ANION GAP: 11 (ref 5–15)
BUN: 29 mg/dL — ABNORMAL HIGH (ref 6–20)
CO2: 28 mmol/L (ref 22–32)
Calcium: 8.5 mg/dL — ABNORMAL LOW (ref 8.9–10.3)
Chloride: 98 mmol/L — ABNORMAL LOW (ref 101–111)
Creatinine, Ser: 7.05 mg/dL — ABNORMAL HIGH (ref 0.44–1.00)
GFR, EST AFRICAN AMERICAN: 7 mL/min — AB (ref >60)
GFR, EST NON AFRICAN AMERICAN: 6 mL/min — AB (ref >60)
GLUCOSE: 89 mg/dL (ref 65–99)
Potassium: 4 mmol/L (ref 3.5–5.1)
Sodium: 137 mmol/L (ref 135–145)

## 2018-03-10 LAB — MRSA PCR SCREENING: MRSA by PCR: NEGATIVE

## 2018-03-10 LAB — TROPONIN I
Troponin I: <0.03 ng/mL (ref <0.03)
Troponin I: <0.03 ng/mL (ref <0.03)

## 2018-03-10 LAB — EXPECTORATED SPUTUM ASSESSMENT W GRAM STAIN, RFLX TO RESP C

## 2018-03-10 LAB — STREP PNEUMONIAE URINARY ANTIGEN: Strep Pneumo Urinary Antigen: NEGATIVE

## 2018-03-10 LAB — EXPECTORATED SPUTUM ASSESSMENT W REFEX TO RESP CULTURE

## 2018-03-10 LAB — PROCALCITONIN: Procalcitonin: 0.55 ng/mL

## 2018-03-10 MED ORDER — SODIUM CHLORIDE 0.9 % IV SOLN: 62.5000 mg | INTRAVENOUS | Status: DC

## 2018-03-10 MED ORDER — OXYCODONE ER 36 MG PO C12A: 36.0000 mg | EXTENDED_RELEASE_CAPSULE | Freq: Two times a day (BID) | ORAL | Status: DC

## 2018-03-10 MED ORDER — CALCITRIOL 0.5 MCG PO CAPS: 0.7500 ug | ORAL_CAPSULE | ORAL | Status: DC

## 2018-03-10 MED ORDER — OXYCODONE HCL ER 10 MG PO T12A
30.0000 mg | EXTENDED_RELEASE_TABLET | Freq: Two times a day (BID) | ORAL | Status: DC
  Administered 2018-03-10 – 2018-03-11 (×3): 30 mg via ORAL
  Filled 2018-03-10 (×3): qty 3

## 2018-03-10 MED ORDER — VANCOMYCIN HCL IN DEXTROSE 750-5 MG/150ML-% IV SOLN
750.0000 mg | INTRAVENOUS | Status: DC
  Filled 2018-03-10 (×2): qty 150

## 2018-03-10 MED ORDER — ORAL CARE MOUTH RINSE
15.0000 mL | Freq: Two times a day (BID) | OROMUCOSAL | Status: DC
  Administered 2018-03-10 – 2018-03-11 (×3): 15 mL via OROMUCOSAL

## 2018-03-10 MED ORDER — HYDROMORPHONE HCL 1 MG/ML IJ SOLN
0.5000 mg | INTRAMUSCULAR | Status: DC | PRN
  Administered 2018-03-10 – 2018-03-11 (×8): 0.5 mg via INTRAVENOUS
  Filled 2018-03-10 (×8): qty 0.5

## 2018-03-10 MED ORDER — VANCOMYCIN HCL IN DEXTROSE 750-5 MG/150ML-% IV SOLN: 750.0000 mg | INTRAVENOUS | Status: DC

## 2018-03-10 MED ORDER — SODIUM CHLORIDE 0.9 % IV SOLN
1.5000 g | Freq: Two times a day (BID) | INTRAVENOUS | Status: DC
  Administered 2018-03-10 – 2018-03-11 (×3): 1.5 g via INTRAVENOUS
  Filled 2018-03-10 (×4): qty 1.5

## 2018-03-10 MED ORDER — VANCOMYCIN HCL 500 MG IV SOLR
500.0000 mg | INTRAVENOUS | Status: AC
  Administered 2018-03-10: 500 mg via INTRAVENOUS
  Filled 2018-03-10: qty 500

## 2018-03-10 MED ORDER — DARBEPOETIN ALFA 100 MCG/0.5ML IJ SOSY: 100.0000 ug | PREFILLED_SYRINGE | INTRAMUSCULAR | Status: DC

## 2018-03-10 NOTE — Consult Note (Addendum)
[facility] Renal Consultation Note  Indication for Consultation:  Management of ESRD/hemodialysis; anemia, hypertension/volume and secondary hyperparathyroidism  HPI: [name] is a 47 y.o. female with ESRD Chronic HD MWF  presents to ER  Co Swelling /pain Left arm and sob. She is sp 03/02/18 Dr Donzetta Matters revision AVF (Had Fusiform Dilatation of proximal segment  AVF) ,with 6 mm Artegraft interposition .She missed HD Friday 3rd and Monday 6th secodary to arm discomfort. She did have op hd on 7th  And 8th. Has become more sob over last 2 days  With reported fevers, chills, L sided pleuritic  CP . In ER CT Angiogram showed PNA/dopplers of arm showed no thrombus.  Noted other problems include ho tobacco abuse "stopped smoking cigarettes 5 months ago."ADD. Chronic pain syndrome ,Anxiety , Depression,( on multiple meds followed by pain clinic in [place],)sleep apnea (does not use CPAP)      Past Medical History:  Diagnosis Date  . Abscess of tendon sheath, left ankle and foot 02/14/2014  . ADD (attention deficit disorder)   . Anemia   . Anemia   . Anxiety   . Arthritis    knee, wrist, back   . Asthma   . Blood transfusion 06/2011   6 units transfused at [facility]  . Bronchitis   . Depression   . Diverticulitis   . Dizziness   . ESRD (end stage renal disease) ([place])    Hemodialysis 3 times a  week - [place]  . Gastritis   . GERD (gastroesophageal reflux disease)   . Gout   . Headache(784.0)    migraines  . Hypertension    sees Dr. Wendie Agreste  . Neuromuscular disorder (HCC)    carpal tunnel  . Obesity   . Pneumonia   . PONV (postoperative nausea and vomiting)   . Reflux   . Renal insufficiency   . Shortness of breath   . Sleep apnea    does not use CPAP    Past Surgical History:  Procedure Laterality Date  . APPLICATION OF WOUND VAC Left 06/24/2017   Procedure: APPLICATION OF WOUND VAC;  Surgeon: Renette Butters, MD;  Location: [facility];  Service:  Orthopedics;  Laterality: Left;  . AV FISTULA PLACEMENT  06/10/11   Left brachiocephalic AVF  . AV Fistula surgery  11/30/11   [facility] - redo fistula  . Carpel tunnel release     x 2; bilateral  . CESAREAN SECTION    . colonscopy    . DILATION AND CURETTAGE OF UTERUS    . HYSTEROSCOPY WITH THERMACHOICE  12/10/2011   Procedure: HYSTEROSCOPY WITH THERMACHOICE;  Surgeon: Betsy Coder, MD;  Location: [facility];  Service: Gynecology;  Laterality: N/A;  . I&D EXTREMITY Left 06/12/2017   Procedure: IRRIGATION AND DEBRIDEMENT FOOT;  Surgeon: Marchia Bond, MD;  Location: [facility];  Service: Orthopedics;  Laterality: Left;  . I&D EXTREMITY Left 06/14/2017   Procedure: IRRIGATION AND DEBRIDEMENT EXTREMITY/WOUND CLOSURE;  Surgeon: Renette Butters, MD;  Location: [facility];  Service: Orthopedics;  Laterality: Left;  . I&D EXTREMITY Left 06/27/2017   Procedure: IRRIGATION AND DEBRIDEMENT LEFT TIBIA;  Surgeon: Renette Butters, MD;  Location: [facility];  Service: Orthopedics;  Laterality: Left;  . INCISION AND DRAINAGE ABSCESS Left 06/24/2017   Procedure: INCISION AND DRAINAGE ABSCESS LEFT TIBIA AND APPLICATION OF WOUND VAC;  Surgeon: Renette Butters, MD;  Location: [facility];  Service: Orthopedics;  Laterality: Left;  . IRRIGATION AND  DEBRIDEMENT ABSCESS Left 02/15/2014   Procedure: IRRIGATION AND DRAINAGE OF LOW BACK ABSCESS;  Surgeon: Imogene Burn. Georgette Dover, MD;  Location: [facility];  Service: General;  Laterality: Left;  . microdiskectomy  04/2001   Herniated disk L5-S1  . RENAL BIOPSY  04/2004  . REVISON OF ARTERIOVENOUS FISTULA Left 01/04/2017   Procedure: REVISON OF ARTERIOVENOUS FISTULA; CEPHALIC VEIN TURNDOWN;  Surgeon: Angelia Mould, MD;  Location: [facility];  Service: Vascular;  Laterality: Left;  . REVISON OF ARTERIOVENOUS FISTULA Left 03/02/2018   Procedure: REVISION OF BRACHIOCEPHALIC ARTERIOVENOUS FISTULA LEFT UPPER ARM USING ARTEGRAFT;  Surgeon: Waynetta Sandy, MD;  Location: [facility];  Service:  Vascular;  Laterality: Left;  . SHUNTOGRAM  Dec. 18, 2013   Left arm  . SHUNTOGRAM N/A 10/18/2012   Procedure: Earney Mallet;  Surgeon: Conrad , MD;  Location: [facility] CATH LAB;  Service: Cardiovascular;  Laterality: N/A;  . SHUNTOGRAM Left 04/18/2013   Procedure: Earney Mallet;  Surgeon: Serafina Mitchell, MD;  Location: [facility] CATH LAB;  Service: Cardiovascular;  Laterality: Left;  . SPINE SURGERY  2004   Lumbar diskectomy   . UPPER GASTROINTESTINAL ENDOSCOPY    . WISDOM TOOTH EXTRACTION        Family History  Problem Relation Age of Onset  . Hypertension Mother   . Heart disease Father        Heart Disease before age 96  . Hypertension Father   . Heart attack Father   . Hypertension Daughter       reports that she quit smoking about 5 months ago. Her smoking use included cigarettes. She has a 9.00 pack-year smoking history. She has never used smokeless tobacco. She reports that she drank alcohol. She reports that she does not use drugs.   Allergies  Allergen Reactions  . Nsaids Other (See Comments)    Renal failure  . Tolmetin Other (See Comments)    Renal failure  . Clindamycin/Lincomycin Hives and Rash  . Demerol Nausea And Vomiting and Other (See Comments)    Severe headaches  . Fentanyl Hives, Nausea And Vomiting, Rash and Other (See Comments)    Reaction to patches only  . Other Other (See Comments)    Seeds and nuts- has diverticulitis   . Vancomycin Hives and Other (See Comments)    ? 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  . Clarithromycin Nausea And Vomiting  . Morphine And Related Nausea And Vomiting and Other (See Comments)    Severe headache, Pt does not want it!    Prior to Admission medications   Medication Sig Start Date End Date Taking? Authorizing Provider  acetaminophen (TYLENOL) 325 MG tablet Take 2 tablets (650 mg total) by mouth every 8 (eight) hours as needed for mild pain, fever or headache. 06/14/17  Yes Everrett Coombe, MD  albuterol (PROVENTIL HFA;VENTOLIN HFA) 108 (90 BASE)  MCG/ACT inhaler Inhale 2 puffs into the lungs every 6 (six) hours as needed for shortness of breath.    Yes [provider]  ALPRAZolam Duanne Moron) 1 MG tablet Take 1 mg by mouth 3 (three) times daily.   Yes [provider]  amLODipine (NORVASC) 10 MG tablet Take 10 mg by mouth at bedtime.  02/08/14  Yes [provider]  budesonide-formoterol (SYMBICORT) 160-4.5 MCG/ACT inhaler Inhale 2 puffs into the lungs 2 (two) times daily.   Yes [provider]  buPROPion (WELLBUTRIN SR) 150 MG 12 hr tablet Take 150 mg by mouth daily. 04/15/17  Yes [provider]  calcium acetate (  PHOSLO) 667 MG capsule Take 2,001 mg by mouth 2 (two) times daily with a meal.   Yes [provider]  cyclobenzaprine (FLEXERIL) 10 MG tablet Take 20 mg by mouth at bedtime.    Yes [provider]  HYDROmorphone (DILAUDID) 2 MG tablet Take 0.5 tablets (1 mg total) by mouth every 6 (six) hours as needed for moderate pain or severe pain. 06/28/17  Yes Sherene Sires, DO  lidocaine-prilocaine (EMLA) cream Apply 1 application topically See admin instructions. Apply topically one hour prior to dialysis - Monday, Wednesday, Friday   Yes [provider]  Melatonin 5 MG TABS Take 5 mg by mouth at bedtime.    Yes [provider]  metoprolol tartrate (LOPRESSOR) 25 MG tablet Take 1 tablet (25 mg total) by mouth 2 (two) times daily. 06/14/17  Yes Everrett Coombe, MD  montelukast (SINGULAIR) 10 MG tablet Take 10 mg by mouth daily.    Yes [provider]  multivitamin (RENA-VIT) TABS tablet Take 1 tablet by mouth daily.   Yes [provider]  nicotine (NICODERM CQ - DOSED IN MG/24 HOURS) 14 mg/24hr patch Place 1 patch (14 mg total) onto the skin daily. 06/28/17  Yes Sherene Sires, DO  oxyCODONE ER (XTAMPZA ER) 36 MG C12A Take 36 mg by mouth 2 (two) times daily.   Yes [provider]  sevelamer carbonate (RENVELA) 800 MG tablet Take 2,400 mg by mouth 2 (two)  times daily with a meal.    Yes [provider]  SUMAtriptan (IMITREX) 50 MG tablet Take 50 mg by mouth daily as needed for migraine or headache.  02/14/14  Yes [provider]     Anti-infectives (From admission, onward)   Start     Dose/Rate Route Frequency Ordered Stop   03/13/18 1200  vancomycin (VANCOCIN) IVPB 750 mg/150 ml premix  Status:  Discontinued     750 mg 75 mL/hr over 120 Minutes Intravenous Every M-W-F (Hemodialysis) 03/10/18 1232 03/10/18 1233   03/10/18 1245  vancomycin (VANCOCIN) IVPB 750 mg/150 ml premix     750 mg 75 mL/hr over 120 Minutes Intravenous Every M-W-F (Hemodialysis) 03/10/18 1233     03/10/18 0830  ampicillin-sulbactam (UNASYN) 1.5 g in sodium chloride 0.9 % 100 mL IVPB    Note to Pharmacy:  Unasyn 1.5 g IV q12h in ESRD on HD   1.5 g 200 mL/hr over 30 Minutes Intravenous 2 times daily 03/10/18 0744     03/10/18 0830  vancomycin (VANCOCIN) 500 mg in sodium chloride 0.9 % 100 mL IVPB     500 mg 50 mL/hr over 120 Minutes Intravenous NOW 03/10/18 0829 03/10/18 1255   03/09/18 1715  vancomycin (VANCOCIN) IVPB 1000 mg/200 mL premix    Note to Pharmacy:  Please infuse over 1hr. Hx of redman   1,000 mg 200 mL/hr over 60 Minutes Intravenous  Once 03/09/18 1713 03/09/18 1912      Results for orders placed or performed during the hospital encounter of 03/09/18 (from the past 48 hour(s))  I-Stat beta hCG blood, ED     Status: None   Collection Time: 03/09/18  4:01 PM  Result Value Ref Range   I-stat hCG, quantitative <5.0 <5 mIU/mL   Comment 3            Comment:   GEST. AGE      CONC.  (mIU/mL)   <=1 WEEK        5 - 50     2 WEEKS  50 - 500     3 WEEKS       100 - 10,000     4 WEEKS     1,000 - 30,000        FEMALE AND NON-PREGNANT FEMALE:     LESS THAN 5 mIU/mL   I-Stat CG4 Lactic Acid, ED     Status: None   Collection Time: 03/09/18  4:04 PM  Result Value Ref Range   Lactic Acid, Venous 0.82 0.5 - 1.9 mmol/L  Comprehensive  metabolic panel     Status: Abnormal   Collection Time: 03/09/18  4:05 PM  Result Value Ref Range   Sodium 139 135 - 145 mmol/L   Potassium 3.8 3.5 - 5.1 mmol/L   Chloride 97 (L) 101 - 111 mmol/L   CO2 30 22 - 32 mmol/L   Glucose, Bld 112 (H) 65 - 99 mg/dL   BUN 25 (H) 6 - 20 mg/dL   Creatinine, Ser 6.37 (H) 0.44 - 1.00 mg/dL   Calcium 8.9 8.9 - 10.3 mg/dL   Total Protein 6.0 (L) 6.5 - 8.1 g/dL   Albumin 3.5 3.5 - 5.0 g/dL   AST 29 15 - 41 U/L   ALT 5 (L) 14 - 54 U/L   Alkaline Phosphatase 119 38 - 126 U/L   Total Bilirubin 0.9 0.3 - 1.2 mg/dL   GFR calc non Af Amer 7 (L) >60 mL/min   GFR calc Af Amer 8 (L) >60 mL/min    Comment: (NOTE) The eGFR has been calculated using the CKD EPI equation. This calculation has not been validated in all clinical situations. eGFR's persistently <60 mL/min signify possible Chronic Kidney Disease.    Anion gap 12 5 - 15    Comment: Performed at [facility] [address] [place], [place] [address]  CBC with Differential     Status: Abnormal   Collection Time: 03/09/18  4:05 PM  Result Value Ref Range   WBC 4.4 4.0 - 10.5 K/uL   RBC 3.35 (L) 3.87 - 5.11 MIL/uL   Hemoglobin 10.1 (L) 12.0 - 15.0 g/dL   HCT 33.7 (L) 36.0 - 46.0 %   MCV 100.6 (H) 78.0 - 100.0 fL   MCH 30.1 26.0 - 34.0 pg   MCHC 30.0 30.0 - 36.0 g/dL   RDW 15.7 (H) 11.5 - 15.5 %   Platelets 106 (L) 150 - 400 K/uL    Comment: REPEATED TO VERIFY SPECIMEN CHECKED FOR CLOTS PLATELET COUNT CONFIRMED BY SMEAR    Neutrophils Relative % 70 %   Neutro Abs 3.1 1.7 - 7.7 K/uL   Lymphocytes Relative 21 %   Lymphs Abs 0.9 0.7 - 4.0 K/uL   Monocytes Relative 9 %   Monocytes Absolute 0.4 0.1 - 1.0 K/uL   Eosinophils Relative 0 %   Eosinophils Absolute 0.0 0.0 - 0.7 K/uL   Basophils Relative 0 %   Basophils Absolute 0.0 0.0 - 0.1 K/uL    Comment: Performed at [facility], [place] [address], [place], [place] [address]  Protime-INR     Status: None   Collection Time:  03/09/18  4:05 PM  Result Value Ref Range   Prothrombin Time 13.9 11.4 - 15.2 seconds   INR 1.08     Comment: Performed at [facility] [address], [place],  [address]  Culture, blood (Routine x 2)     Status: None (Preliminary result)   Collection Time: 03/09/18  4:05 PM  Result Value Ref Range   Specimen Description BLOOD RIGHT ARM    Special Requests      BOTTLES DRAWN AEROBIC AND ANAEROBIC Blood Culture results may not be optimal due to an excessive volume of blood received in culture bottles   Culture      NO GROWTH < 24 HOURS Performed at [facility] [address], [place], [place] [address]    Report Status PENDING   Culture, blood (Routine x 2)     Status: None (Preliminary result)   Collection Time: 03/09/18  4:06 PM  Result Value Ref Range   Specimen Description BLOOD RIGHT ARM    Special Requests      BOTTLES DRAWN AEROBIC AND ANAEROBIC Blood Culture adequate volume   Culture      NO GROWTH < 24 HOURS Performed at [facility], [place] [address], [place], [place] [address]    Report Status PENDING   I-Stat Troponin, ED (not at [facility])     Status: None   Collection Time: 03/09/18  6:22 PM  Result Value Ref Range   Troponin i, poc 0.02 0.00 - 0.08 ng/mL   Comment 3            Comment: Due to the release kinetics of cTnI, a negative result within the first hours of the onset of symptoms does not rule out myocardial infarction with certainty. If myocardial infarction is still suspected, repeat the test at appropriate intervals.   I-Stat CG4 Lactic Acid, ED     Status: None   Collection Time: 03/09/18  6:24 PM  Result Value Ref Range   Lactic Acid, Venous 0.67 0.5 - 1.9 mmol/L  MRSA PCR Screening     Status: None   Collection Time: 03/09/18 11:01 PM  Result Value Ref Range   MRSA by PCR NEGATIVE NEGATIVE    Comment:        The GeneXpert MRSA Assay (FDA approved for NASAL specimens only), is one component of a comprehensive MRSA  colonization surveillance program. It is not intended to diagnose MRSA infection nor to guide or monitor treatment for MRSA infections. Performed at [facility], [place] [address], [place], [place] [address]   Urinalysis, Routine w reflex microscopic     Status: Abnormal   Collection Time: 03/09/18 11:28 PM  Result Value Ref Range   Color, Urine YELLOW YELLOW   APPearance CLEAR CLEAR   Specific Gravity, Urine 1.014 1.005 - 1.030   pH 9.0 (H) 5.0 - 8.0   Glucose, UA NEGATIVE NEGATIVE mg/dL   Hgb urine dipstick NEGATIVE NEGATIVE   Bilirubin Urine MODERATE (A) NEGATIVE   Ketones, ur NEGATIVE NEGATIVE mg/dL   Protein, ur >=300 (A) NEGATIVE mg/dL   Nitrite NEGATIVE NEGATIVE   Leukocytes, UA NEGATIVE NEGATIVE   RBC / HPF 0-5 0 - 5 RBC/hpf   WBC, UA 0-5 0 - 5 WBC/hpf   Bacteria, UA RARE (A) NONE SEEN   Squamous Epithelial / LPF 6-10 0 - 5    Comment: Performed at [facility], [address], [place],  [address]  Strep pneumoniae urinary antigen     Status: None   Collection Time: 03/09/18 11:28 PM  Result Value Ref Range   Strep Pneumo Urinary Antigen NEGATIVE NEGATIVE    Comment:        Infection due to S. pneumoniae cannot be absolutely ruled out since the antigen present may be below the detection limit of the test.  Influenza panel by PCR (type A & B)     Status: None   Collection Time: 03/09/18 11:28 PM  Result Value Ref Range   Influenza A By PCR NEGATIVE NEGATIVE   Influenza B By PCR NEGATIVE NEGATIVE    Comment: (NOTE) The Xpert Xpress Flu assay is intended as an aid in the diagnosis of  influenza and should not be used as a sole basis for treatment.  This  assay is FDA approved for nasopharyngeal swab specimens only. Nasal  washings and aspirates are unacceptable for Xpert Xpress Flu testing. Performed at [facility], [place] [address] [place], [place] [address]   Basic metabolic panel     Status: Abnormal   Collection Time: 03/10/18  2:47  AM  Result Value Ref Range   Sodium 137 135 - 145 mmol/L   Potassium 4.0 3.5 - 5.1 mmol/L   Chloride 98 (L) 101 - 111 mmol/L   CO2 28 22 - 32 mmol/L   Glucose, Bld 89 65 - 99 mg/dL   BUN 29 (H) 6 - 20 mg/dL   Creatinine, Ser 7.05 (H) 0.44 - 1.00 mg/dL   Calcium 8.5 (L) 8.9 - 10.3 mg/dL   GFR calc non Af Amer 6 (L) >60 mL/min   GFR calc Af Amer 7 (L) >60 mL/min    Comment: (NOTE) The eGFR has been calculated using the CKD EPI equation. This calculation has not been validated in all clinical situations. eGFR's persistently <60 mL/min signify possible Chronic Kidney Disease.    Anion gap 11 5 - 15    Comment: Performed at [facility] [address] [place], [place] [address]  CBC     Status: Abnormal   Collection Time: 03/10/18  2:47 AM  Result Value Ref Range   WBC 4.0 4.0 - 10.5 K/uL   RBC 3.06 (L) 3.87 - 5.11 MIL/uL   Hemoglobin 9.3 (L) 12.0 - 15.0 g/dL   HCT 30.8 (L) 36.0 - 46.0 %   MCV 100.7 (H) 78.0 - 100.0 fL   MCH 30.4 26.0 - 34.0 pg   MCHC 30.2 30.0 - 36.0 g/dL   RDW 15.6 (H) 11.5 - 15.5 %   Platelets 106 (L) 150 - 400 K/uL    Comment: CONSISTENT WITH PREVIOUS RESULT Performed at [facility], [place] [address] [place], [place] [address]   Troponin I     Status: None   Collection Time: 03/10/18  2:47 AM  Result Value Ref Range   Troponin I <0.03 <0.03 ng/mL    Comment: Performed at [facility] [address] [place], [place] [address]  Troponin I (q 6hr x 3)     Status: None   Collection Time: 03/10/18  7:51 AM  Result Value Ref Range   Troponin I <0.03 <0.03 ng/mL    Comment: Performed at [facility] [address], [place], [place] [address]  Procalcitonin - Baseline     Status: None   Collection Time: 03/10/18  7:51 AM  Result Value Ref Range   Procalcitonin 0.55 ng/mL    Comment:        Interpretation: PCT > 0.5 ng/mL and <= 2 ng/mL: Systemic infection (sepsis) is possible, but other conditions are known to elevate PCT as  well. (NOTE)       Sepsis PCT Algorithm           Lower Respiratory Tract  Infection PCT Algorithm    ----------------------------     ----------------------------         PCT < 0.25 ng/mL                PCT < 0.10 ng/mL         Strongly encourage             Strongly discourage   discontinuation of antibiotics    initiation of antibiotics    ----------------------------     -----------------------------       PCT 0.25 - 0.50 ng/mL            PCT 0.10 - 0.25 ng/mL               OR       >80% decrease in PCT            Discourage initiation of                                            antibiotics      Encourage discontinuation           of antibiotics    ----------------------------     -----------------------------         PCT >= 0.50 ng/mL              PCT 0.26 - 0.50 ng/mL                AND       <80% decrease in PCT             Encourage initiation of                                             antibiotics       Encourage continuation           of antibiotics    ----------------------------     -----------------------------        PCT >= 0.50 ng/mL                  PCT > 0.50 ng/mL               AND         increase in PCT                  Strongly encourage                                      initiation of antibiotics    Strongly encourage escalation           of antibiotics                                     -----------------------------                                           PCT <= 0.25 ng/mL  OR                                        > 80% decrease in PCT                                     Discontinue / Do not initiate                                             antibiotics Performed at [facility], [place] [address] [place], [place] [address]   Culture, sputum-assessment     Status: None   Collection Time: 03/10/18 11:48 AM  Result Value Ref Range   Specimen Description  SPUTUM    Special Requests NONE    Sputum evaluation      Sputum specimen not acceptable for testing.  Please recollect.   Gram Stain Report Called to,Read Back By and Verified With: R ARYAL,RN AT 1320 03/10/18 BY L BENFIELD Performed at [facility], [place] [address] [place], [place] [address]    Report Status 03/10/2018 FINAL     ROS: see above in hpi  Physical Exam: Vitals:   03/10/18 1234 03/10/18 1428  BP: 132/80   Pulse: 84   Resp:    Temp: 98.4 F (36.9 C)   SpO2: 97% 94%     General: alert Female chronically ill appearing, NAD  But anxious  HEENT:  , EOMI, non icteric , mmm  Neck: supple ,no jvd Heart: RRR, no rub, mur, gallop Lungs: faint exp wheezing scattered,nonlabored breathing  Abdomen: bs pos soft NT, ND Extremities: no pedal edema  Skin: warm ,no overt rash , healed surgical area of LU Arm AVF revision no dc noted / L hand warm to touch = normal temp +edema of left hand/arm with ecchymoses around revision site of AVF Neuro: alert , OX3, no acute focal deficits  Dialysis Access: Pos bruit  LUA  AVF with some swelling   Dialysis Orders: Center: [facility]   on MWF . EDW 63.5 kg HD Bath 2k 2ca  Time 3.5 hr Heparin none. Access LUA AVF    Calcitriol 0.75 mcg po HD MIrcera 75 mcg Last on   03/08/18    Venofer  '50mg'$  q hd   Assessment/Plan 1. Pneumonia/ Bronchitis = per admit team RX 2. ESRD -  HD MWF =k and vol ok  Today and multple In pt cases this afternoon ,can wait till tomor for hd  And also Dr Donzetta Matters eval avf  Tonight for use 3. Left upper extrem /Avf swelling  Sp revision , Dr Donzetta Matters to eval (appear post op nl swelling ,neg dopplers noted)   4. Hypertension/volume  - b p ok and vol stable / op meds Amlodipine 10 mg hs and Lopressor 25 mg bid  5. Anemia  - hgb 9.3  esa next week  6. Metabolic bone disease - Renvela ac po vit d on hd  7. Anxiety / ADD/ Depression meds pewr admit 8. Chronic pain syndrome - meds per admit  9. Nutrition -  Alb 3.5  renal diet /  renal vit   Ernest Haber, PA-C [facility] [phone] 03/10/2018, 2:34 PM  I have seen and examined this patient and agree with plan and assessment in the above note with renal recommendations/intervention highlighted.  Pt's left arm is swollen and bruised from her recent surgical revision, however there is no evidence of infection.  She may be having a goretex reaction from the replaced segment.  Await [facility] evaluation.  Will plan for HD after she is evaluated by [facility].  Volume status and electrolytes are stable and can wait for HD tomorrow if needed.  Governor Rooks Daiden Coltrane,MD 03/10/2018 4:38 PM

## 2018-03-10 NOTE — Progress Notes (Signed)
Pt family called upset regarding no results for ultrasound at this time. Informed that pt is alert and oriented and results will be communicated with the pt. Family understanding at this time. Paged MD to inform pt with results when possible. Primary RN aware

## 2018-03-10 NOTE — Progress Notes (Addendum)
Pharmacy Antibiotic Note  [name] is a 47 y.o. female admitted on 03/09/2018 with cellulitis.  Pharmacy has been consulted for vancomycin dosing. Also on Unasyn per MD for PNA. Noted, ESRD on HD MWF. Pt received vancomycin 1g IV x 1 on 5/9, no HD so far this admit, awaiting Renal consult. Per discussion with [facility], confirmed patient will go to HD today. Noted hx of possible Red Man syndrome with vancomycin documented in the past but has tolerated vancomycin here so far this admit - will lengthen infusion time to help patient tolerate doses.  Plan: Vancomycin additional 500mg  IV x 1 to complete 1500mg  IV x load; then 750mg  IV qHD MWF Unasyn 1.5mg  IV q12h Monitor clinical progress, c/s, abx plan/LOT Pre-HD vancomycin level as indicated F/u HD schedule/tolerance inpatient   Height: 5\' 4"  (162.6 cm) Weight: 137 lb 12.6 oz (62.5 kg) IBW/kg (Calculated) : 54.7  Temp (24hrs), Avg:100.6 F (38.1 C), Min:98.5 F (36.9 C), Max:102.1 F (38.9 C)  Recent Labs  Lab 03/09/18 1604 03/09/18 1605 03/09/18 1824 03/10/18 0247  WBC  --  4.4  --  4.0  CREATININE  --  6.37*  --  7.05*  LATICACIDVEN 0.82  --  0.67  --     Estimated Creatinine Clearance: 8.7 mL/min (A) (by C-G formula based on SCr of 7.05 mg/dL (H)).    Allergies  Allergen Reactions  . Nsaids Other (See Comments)    Renal failure  . Tolmetin Other (See Comments)    Renal failure  . Clindamycin/Lincomycin Hives and Rash  . Demerol Nausea And Vomiting and Other (See Comments)    Severe headaches  . Fentanyl Hives, Nausea And Vomiting, Rash and Other (See Comments)    Reaction to patches only  . Other Other (See Comments)    Seeds and nuts- has diverticulitis   . Vancomycin Hives and Other (See Comments)    ? 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  . Clarithromycin Nausea And Vomiting  . Morphine And Related Nausea And Vomiting and Other (See Comments)    Severe headache, Pt does not want it!   Elicia Lamp, PharmD, BCPS Clinical  Pharmacist Clinical phone for 03/10/2018 until 3:30pm: [phone] If after 3:30pm, please call main pharmacy at: [phone] 03/10/2018 8:32 AM

## 2018-03-10 NOTE — Progress Notes (Signed)
MD talked with patient regarding the status of her VAS USG of heft hand  Called HD which is not due until tomorrow, vancomycin first does given this morning, HD dose of Vancomycin will rescheduled  Palma Holter, RN

## 2018-03-10 NOTE — Progress Notes (Signed)
Patient is high fall risk due to previous falls. Patient was educated about importance of bed alarm during the night, however patient refuses to be on bed alarm. She stated "I will call for assistance".   Will continue to monitor.  Trino Higinbotham, RN

## 2018-03-10 NOTE — Progress Notes (Signed)
PROGRESS NOTE    [name]  XHB:[id number] DOB: 1971-03-25 DOA: 03/09/2018 PCP: Enid Skeens., MD      Brief Narrative:  [name] is a 47 y.o. F with ESRD on HD MWF, HTN, and recent AV graft revision who presents with fever and chills in the setting of new cough as well as new arm redness and pain.   Assessment & Plan:  Acute hypoxic respiratory failure Healthcare associated pneumonia CT scan shows scattered bronchopneumonia, consistent with aspiration, airspace disease in bilateral lungs.  Influenza negative, strep urine antigen negative, troponin negative.  Legionella urine antigen pending.  Lactate negative.  Blood cultures pending. -We will start with Unasyn for aspiration coverage - If fever worsening, will broaden to include pseudomonas coverage   Possible graft infection/cellulitis versus Goretex reaction -IV Vancomycin for now, low threshold to de-escalate if VVS believe this is not infected graft -Consult vascular surgery -Follow-up left upper extremity ultrasound  Thrombocytopenia -Trend CBC  Hypertension -Continue metoprolol, amlodipine -Continue PRN IV hydralazine -Hold home lisinopril until dose can be confirmed  End-stage renal disease -Consulted nephrology, appreciate cares  Anemia of chronic renal disease Stable  Reactive airway disease -Continue Singulair, mometasone-formoterol, PRN nebs  OSA Not on CPAP  Chronic pain -Continue home OxyContin, Wellbutrin, cyclobenzaprine   DVT prophylaxis: Heparin Code Status: Full code Family Communication: Mother at the bedside MDM and disposition Plan: The below labs and imaging reports were reviewed above.  The patient was admitted with fever chills and infection, suspected pneumonia.  In the meantime she has left upper extremity swelling and pain near her recent graft revision.  We will treat for healthcare associated pneumonia as well as consult vascular surgery  nephrology.   Consultants:   Vascular surgery  Nephrology   Procedures:   LUE [place]  Antimicrobials:   Vancomycin 5/9 >>  Unasyn 5/10 >>    Subjective: Still with a lot of pain in her left arm, very anxious.  There is swelling around the hematoma in her upper arm.  Still with chills, cough, moderate dyspnea.  No confusion, weakness, dizziness.  Objective: Vitals:   03/10/18 1000 03/10/18 1020 03/10/18 1234 03/10/18 1428  BP:   132/80   Pulse:   84   Resp:      Temp:   98.4 F (36.9 C)   TempSrc:   Oral   SpO2: (!) 86% 94% 97% 94%  Weight:      Height:        Intake/Output Summary (Last 24 hours) at 03/10/2018 1716 Last data filed at 03/10/2018 1500 Gross per 24 hour  Intake 980 ml  Output 300 ml  Net 680 ml   Filed Weights   03/09/18 2255 03/10/18 0459  Weight: 62.6 kg (138 lb) 62.5 kg (137 lb 12.6 oz)    Examination: General appearance: Thin adult female, alert and in moderate distress from anxiety/pain.   HEENT: Anicteric, conjunctiva pink, lids and lashes normal. No nasal deformity, discharge, epistaxis.  Lips moist, upper teeth missing, no oral lesions, OP moist.   Skin: Warm and dry.  No jaundice.  No suspicious rashes or lesions.  In the left uper arm there is some mild bruising, no tissue swelling or induration.  It is exquisitely tender. Cardiac: RRR, nl S1-S2, no murmurs appreciated.  Capillary refill is brisk.  JVP normal.  No LE edema.  Radial pulses 2+ and symmetric. Respiratory: Tachypneic, on nasal cannula, nasal flaring noted.  Coarse throughout, actually diminished on the left. Abdomen: Abdomen  soft.  No TTP. No ascites, distension, hepatosplenomegaly.   MSK: No deformities or effusions. Neuro: Awake and alert.  EOMI, moves all extremities. Speech fluent.    Psych: Sensorium intact and responding to questions, attention normal. Affect anxious, extremely.  Judgment and insight appear normal.    Data Reviewed: I have personally reviewed  following labs and imaging studies:  CBC: Recent Labs  Lab 03/09/18 1605 03/10/18 0247  WBC 4.4 4.0  NEUTROABS 3.1  --   HGB 10.1* 9.3*  HCT 33.7* 30.8*  MCV 100.6* 100.7*  PLT 106* 448*   Basic Metabolic Panel: Recent Labs  Lab 03/09/18 1605 03/10/18 0247  NA 139 137  K 3.8 4.0  CL 97* 98*  CO2 30 28  GLUCOSE 112* 89  BUN 25* 29*  CREATININE 6.37* 7.05*  CALCIUM 8.9 8.5*   GFR: Estimated Creatinine Clearance: 8.7 mL/min (A) (by C-G formula based on SCr of 7.05 mg/dL (H)). Liver Function Tests: Recent Labs  Lab 03/09/18 1605  AST 29  ALT 5*  ALKPHOS 119  BILITOT 0.9  PROT 6.0*  ALBUMIN 3.5   No results for input(s): LIPASE, AMYLASE in the last 168 hours. No results for input(s): AMMONIA in the last 168 hours. Coagulation Profile: Recent Labs  Lab 03/09/18 1605  INR 1.08   Cardiac Enzymes: Recent Labs  Lab 03/10/18 0247 03/10/18 0751 03/10/18 1418  TROPONINI <0.03 <0.03 <0.03   BNP (last 3 results) No results for input(s): PROBNP in the last 8760 hours. HbA1C: No results for input(s): HGBA1C in the last 72 hours. CBG: No results for input(s): GLUCAP in the last 168 hours. Lipid Profile: No results for input(s): CHOL, HDL, LDLCALC, TRIG, CHOLHDL, LDLDIRECT in the last 72 hours. Thyroid Function Tests: No results for input(s): TSH, T4TOTAL, FREET4, T3FREE, THYROIDAB in the last 72 hours. Anemia Panel: No results for input(s): VITAMINB12, FOLATE, FERRITIN, TIBC, IRON, RETICCTPCT in the last 72 hours. Urine analysis:    Component Value Date/Time   COLORURINE YELLOW 03/09/2018 [place] 03/09/2018 2328   LABSPEC 1.014 03/09/2018 2328   PHURINE 9.0 (H) 03/09/2018 2328   GLUCOSEU NEGATIVE 03/09/2018 2328   HGBUR NEGATIVE 03/09/2018 2328   BILIRUBINUR MODERATE (A) 03/09/2018 2328   KETONESUR NEGATIVE 03/09/2018 2328   PROTEINUR >=300 (A) 03/09/2018 2328   UROBILINOGEN 0.2 02/01/2015 0021   NITRITE NEGATIVE 03/09/2018 2328    LEUKOCYTESUR NEGATIVE 03/09/2018 2328   Sepsis Labs: @LABRCNTIP (procalcitonin:4,lacticacidven:4)  ) Recent Results (from the past 240 hour(s))  Culture, blood (Routine x 2)     Status: None (Preliminary result)   Collection Time: 03/09/18  4:05 PM  Result Value Ref Range Status   Specimen Description BLOOD RIGHT ARM  Final   Special Requests   Final    BOTTLES DRAWN AEROBIC AND ANAEROBIC Blood Culture results may not be optimal due to an excessive volume of blood received in culture bottles   Culture   Final    NO GROWTH < 24 HOURS Performed at [facility], [place] [address], [place], [place] [address]    Report Status PENDING  Incomplete  Culture, blood (Routine x 2)     Status: None (Preliminary result)   Collection Time: 03/09/18  4:06 PM  Result Value Ref Range Status   Specimen Description BLOOD RIGHT ARM  Final   Special Requests   Final    BOTTLES DRAWN AEROBIC AND ANAEROBIC Blood Culture adequate volume   Culture   Final    NO  GROWTH < 24 HOURS Performed at [facility], [place] [address] [place], [place] [address]    Report Status PENDING  Incomplete  MRSA PCR Screening     Status: None   Collection Time: 03/09/18 11:01 PM  Result Value Ref Range Status   MRSA by PCR NEGATIVE NEGATIVE Final    Comment:        The GeneXpert MRSA Assay (FDA approved for NASAL specimens only), is one component of a comprehensive MRSA colonization surveillance program. It is not intended to diagnose MRSA infection nor to guide or monitor treatment for MRSA infections. Performed at [facility], [place] [address] [place], [place] [address]   Culture, sputum-assessment     Status: None   Collection Time: 03/10/18 11:48 AM  Result Value Ref Range Status   Specimen Description SPUTUM  Final   Special Requests NONE  Final   Sputum evaluation   Final    Sputum specimen not acceptable for testing.  Please recollect.   Gram Stain Report Called to,Read Back By and Verified  With: R ARYAL,RN AT 1320 03/10/18 BY L BENFIELD Performed at [facility], [place] [address], [place], [place] [address]    Report Status 03/10/2018 FINAL  Final         Radiology Studies: Dg Chest 2 View  Result Date: 03/09/2018 CLINICAL DATA:  Shortness of breath EXAM: CHEST - 2 VIEW COMPARISON:  None. FINDINGS: Unchanged mild cardiomegaly. Both lungs are clear. The visualized skeletal structures are unremarkable. IMPRESSION: Unchanged mild cardiomegaly without pulmonary edema. Electronically Signed   By: Ulyses Jarred M.D.   On: 03/09/2018 16:33   Ct Angio Chest Pe W And/or Wo Contrast  Result Date: 03/09/2018 CLINICAL DATA:  Dyspnea x2 days.  Patient had surgery 8 days ago. EXAM: CT ANGIOGRAPHY CHEST WITH CONTRAST TECHNIQUE: Multidetector CT imaging of the chest was performed using the standard protocol during bolus administration of intravenous contrast. Multiplanar CT image reconstructions and MIPs were obtained to evaluate the vascular anatomy. CONTRAST:  67 cc ISOVUE-370 IOPAMIDOL (ISOVUE-370) INJECTION 76% COMPARISON:  CXR 03/09/2018 FINDINGS: Cardiovascular: Cardiomegaly with left main and three-vessel coronary arteriosclerosis. No pericardial effusion. Conventional branch pattern of the great vessels without significant stenosis. No aortic aneurysm. No acute pulmonary embolus to the segmental level. Mediastinum/Nodes: Mildly enlarged aorticopulmonary, paratracheal, subcarinal and bilateral hilar lymph nodes. Lungs/Pleura: Ill-defined, somewhat rounded acinar opacities/nodules are seen bilaterally, multilobar in appearance, greatest in the left upper lobe, lingula and left lower lobe with lesser degrees in the right lower lobe and right upper lobe. These more likely represent stigmata of bronchopneumonia though can be seen in the setting of aspiration, vasculitis or less likely neoplasm. No effusion or pneumothorax. Upper Abdomen: No acute abnormality. Musculoskeletal: No chest wall  abnormality. No acute or significant osseous findings. Review of the MIP images confirms the above findings. IMPRESSION: 1. Multilobar acinar opacities more commonly associated with bronchopneumonia. Followup PA and lateral chest X-ray is recommended in 3-4 weeks following trial of antibiotic therapy to ensure resolution and exclude underlying malignancy. 2. Likely mild mediastinal and bilateral reactive adenopathy. 3. Coronary arteriosclerosis. 4. No acute pulmonary embolus. Electronically Signed   By: Ashley Royalty M.D.   On: 03/09/2018 19:51        Scheduled Meds: . amLODipine  10 mg Oral QHS  . buPROPion  150 mg Oral Daily  . [START ON 03/13/2018] calcitRIOL  0.75 mcg Oral Q M,W,F-HD  . calcium acetate  2,001 mg Oral BID  WC  . cyclobenzaprine  20 mg Oral QHS  . [START ON 03/15/2018] darbepoetin (ARANESP) injection - DIALYSIS  100 mcg Intravenous Q Wed-HD  . heparin  5,000 Units Subcutaneous Q8H  . ipratropium-albuterol  3 mL Nebulization Q4H  . mouth rinse  15 mL Mouth Rinse BID  . metoprolol tartrate  25 mg Oral BID  . mometasone-formoterol  2 puff Inhalation BID  . montelukast  10 mg Oral Daily  . multivitamin  1 tablet Oral QHS  . oxyCODONE  30 mg Oral Q12H  . sevelamer carbonate  2,400 mg Oral BID WC   Continuous Infusions: . ampicillin-sulbactam (UNASYN) IV Stopped (03/10/18 1428)  . [START ON 03/15/2018] ferric gluconate (FERRLECIT/NULECIT) IV    . vancomycin       LOS: 1 day    Time spent: 25 minutes    Edwin Dada, MD [facility] 03/10/2018, 10:30 AM     Pager [phone] --- please page though AMION:  [email or web address] If 7PM-7AM, please contact night-coverage

## 2018-03-10 NOTE — Progress Notes (Signed)
REVISION OF BRACHIOCEPHALIC ARTERIOVENOUS FISTULA LEFT UPPER ARM USING ARTEGRAFT        Preliminary results by tech - Left Upper Ext. Venous Duplex Completed. Technically limited evaluation of the venous system due to s/p revision of AVF in the left arm. The internal jugular and radial and ulnar veins are patent without evidence of thrombus. There is a patent AVF noted. The subclavian, axillary and brachial veins not obtainable. There is a heterogeneous structure anterior to AVF in the proximal arm noted, most likely a hematoma. Oda Cogan, BS, RDMS, RVT

## 2018-03-10 NOTE — Consult Note (Signed)
Hospital Consult    Reason for Consult:  Left arm swelling Referring Physician:  hospitalist MRN #:  [id number]  History of Present Illness: This is a 47 y.o. female with esrd recently underwent revision of left arm avf and also had an infiltration event. She continues to dialyze via the upper part of the avf. Was evaluated in office yesterday for fever to 102 now found to have bilateral pneumonia as a likely source on CT. She also has associated soa. Left arm is edematous and also causing significant pain and is limited in mobility at this point.  Past Medical History:  Diagnosis Date  . Abscess of tendon sheath, left ankle and foot 02/14/2014  . ADD (attention deficit disorder)   . Anemia   . Anemia   . Anxiety   . Arthritis    knee, wrist, back   . Asthma   . Blood transfusion 06/2011   6 units transfused at [facility]  . Bronchitis   . Depression   . Diverticulitis   . Dizziness   . ESRD (end stage renal disease) ([place])    Hemodialysis 3 times a  week - [place]  . Gastritis   . GERD (gastroesophageal reflux disease)   . Gout   . Headache(784.0)    migraines  . Hypertension    sees Dr. Wendie Agreste  . Neuromuscular disorder (HCC)    carpal tunnel  . Obesity   . Pneumonia   . PONV (postoperative nausea and vomiting)   . Reflux   . Renal insufficiency   . Shortness of breath   . Sleep apnea    does not use CPAP    Past Surgical History:  Procedure Laterality Date  . APPLICATION OF WOUND VAC Left 06/24/2017   Procedure: APPLICATION OF WOUND VAC;  Surgeon: Renette Butters, MD;  Location: [place];  Service: Orthopedics;  Laterality: Left;  . AV FISTULA PLACEMENT  06/10/11   Left brachiocephalic AVF  . AV Fistula surgery  11/30/11   [facility] - redo fistula  . Carpel tunnel release     x 2; bilateral  . CESAREAN SECTION    . colonscopy    . DILATION AND CURETTAGE OF UTERUS    . HYSTEROSCOPY WITH THERMACHOICE  12/10/2011   Procedure: HYSTEROSCOPY WITH THERMACHOICE;   Surgeon: Betsy Coder, MD;  Location: [facility];  Service: Gynecology;  Laterality: N/A;  . I&D EXTREMITY Left 06/12/2017   Procedure: IRRIGATION AND DEBRIDEMENT FOOT;  Surgeon: Marchia Bond, MD;  Location: [place];  Service: Orthopedics;  Laterality: Left;  . I&D EXTREMITY Left 06/14/2017   Procedure: IRRIGATION AND DEBRIDEMENT EXTREMITY/WOUND CLOSURE;  Surgeon: Renette Butters, MD;  Location: [place];  Service: Orthopedics;  Laterality: Left;  . I&D EXTREMITY Left 06/27/2017   Procedure: IRRIGATION AND DEBRIDEMENT LEFT TIBIA;  Surgeon: Renette Butters, MD;  Location: [place];  Service: Orthopedics;  Laterality: Left;  . INCISION AND DRAINAGE ABSCESS Left 06/24/2017   Procedure: INCISION AND DRAINAGE ABSCESS LEFT TIBIA AND APPLICATION OF WOUND VAC;  Surgeon: Renette Butters, MD;  Location: [place];  Service: Orthopedics;  Laterality: Left;  . IRRIGATION AND DEBRIDEMENT ABSCESS Left 02/15/2014   Procedure: IRRIGATION AND DRAINAGE OF LOW BACK ABSCESS;  Surgeon: Imogene Burn. Georgette Dover, MD;  Location: [place];  Service: General;  Laterality: Left;  . microdiskectomy  04/2001   Herniated disk L5-S1  . RENAL BIOPSY  04/2004  . REVISON OF ARTERIOVENOUS FISTULA Left 01/04/2017   Procedure: REVISON OF  ARTERIOVENOUS FISTULA; CEPHALIC VEIN TURNDOWN;  Surgeon: Angelia Mould, MD;  Location: [place];  Service: Vascular;  Laterality: Left;  . REVISON OF ARTERIOVENOUS FISTULA Left 03/02/2018   Procedure: REVISION OF BRACHIOCEPHALIC ARTERIOVENOUS FISTULA LEFT UPPER ARM USING ARTEGRAFT;  Surgeon: Waynetta Sandy, MD;  Location: [place];  Service: Vascular;  Laterality: Left;  . SHUNTOGRAM  Dec. 18, 2013   Left arm  . SHUNTOGRAM N/A 10/18/2012   Procedure: Earney Mallet;  Surgeon: Conrad Clyde, MD;  Location[facility] C[facility] LAB;  Service: Cardiovascular;  Laterality: N/A;  . SHUNTOGRAM Left 04/18/2013   Procedure: Earney Mallet;  Surgeon: Serafina Mitchell, MD;  Location: [facility] CATH LAB;  Service: Cardiovascular;  Laterality: Left;    . SPINE SURGERY  2004   Lumbar diskectomy   . UPPER GASTROINTESTINAL ENDOSCOPY    . WISDOM TOOTH EXTRACTION      Allergies  Allergen Reactions  . Nsaids Other (See Comments)    Renal failure  . Tolmetin Other (See Comments)    Renal failure  . Clindamycin/Lincomycin Hives and Rash  . Demerol Nausea And Vomiting and Other (See Comments)    Severe headaches  . Fentanyl Hives, Nausea And Vomiting, Rash and Other (See Comments)    Reaction to patches only  . Other Other (See Comments)    Seeds and nuts- has diverticulitis   . Vancomycin Hives and Other (See Comments)    ? 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  . Clarithromycin Nausea And Vomiting  . Morphine And Related Nausea And Vomiting and Other (See Comments)    Severe headache, Pt does not want it!    Prior to Admission medications   Medication Sig Start Date End Date Taking? Authorizing Provider  acetaminophen (TYLENOL) 325 MG tablet Take 2 tablets (650 mg total) by mouth every 8 (eight) hours as needed for mild pain, fever or headache. 06/14/17  Yes Everrett Coombe, MD  albuterol (PROVENTIL HFA;VENTOLIN HFA) 108 (90 BASE) MCG/ACT inhaler Inhale 2 puffs into the lungs every 6 (six) hours as needed for shortness of breath.    Yes [provider]  ALPRAZolam Duanne Moron) 1 MG tablet Take 1 mg by mouth 3 (three) times daily.   Yes [provider]  amLODipine (NORVASC) 10 MG tablet Take 10 mg by mouth at bedtime.  02/08/14  Yes [provider]  budesonide-formoterol (SYMBICORT) 160-4.5 MCG/ACT inhaler Inhale 2 puffs into the lungs 2 (two) times daily.   Yes [provider]  buPROPion (WELLBUTRIN SR) 150 MG 12 hr tablet Take 150 mg by mouth daily. 04/15/17  Yes [provider]  calcium acetate (PHOSLO) 667 MG capsule Take 2,001 mg by mouth 2 (two) times daily with a meal.   Yes [provider]  cyclobenzaprine (FLEXERIL) 10 MG tablet Take 20 mg by mouth at bedtime.    Yes [provider]   HYDROmorphone (DILAUDID) 2 MG tablet Take 0.5 tablets (1 mg total) by mouth every 6 (six) hours as needed for moderate pain or severe pain. 06/28/17  Yes Sherene Sires, DO  lidocaine-prilocaine (EMLA) cream Apply 1 application topically See admin instructions. Apply topically one hour prior to dialysis - Monday, Wednesday, Friday   Yes [provider]  Melatonin 5 MG TABS Take 5 mg by mouth at bedtime.    Yes [provider]  metoprolol tartrate (LOPRESSOR) 25 MG tablet Take 1 tablet (25 mg total) by mouth 2 (two) times daily. 06/14/17  Yes Everrett Coombe, MD  montelukast (SINGULAIR) 10 MG tablet Take 10  mg by mouth daily.    Yes [provider]  multivitamin (RENA-VIT) TABS tablet Take 1 tablet by mouth daily.   Yes [provider]  nicotine (NICODERM CQ - DOSED IN MG/24 HOURS) 14 mg/24hr patch Place 1 patch (14 mg total) onto the skin daily. 06/28/17  Yes Sherene Sires, DO  oxyCODONE ER (XTAMPZA ER) 36 MG C12A Take 36 mg by mouth 2 (two) times daily.   Yes [provider]  sevelamer carbonate (RENVELA) 800 MG tablet Take 2,400 mg by mouth 2 (two) times daily with a meal.    Yes [provider]  SUMAtriptan (IMITREX) 50 MG tablet Take 50 mg by mouth daily as needed for migraine or headache.  02/14/14  Yes [provider]    Social History   Socioeconomic History  . Marital status: Married    Spouse name: Not on file  . Number of children: Not on file  . Years of education: Not on file  . Highest education level: Not on file  Occupational History  . Not on file  Social Needs  . Financial resource strain: Not on file  . Food insecurity:    Worry: Not on file    Inability: Not on file  . Transportation needs:    Medical: Not on file    Non-medical: Not on file  Tobacco Use  . Smoking status: Former Smoker    Packs/day: 0.50    Years: 18.00    Pack years: 9.00    Types: Cigarettes    Last attempt to quit: 10/01/2017    Years  since quitting: 0.4  . Smokeless tobacco: Never Used  Substance and Sexual Activity  . Alcohol use: Not Currently    Comment: occasional  . Drug use: No  . Sexual activity: Not Currently    Birth control/protection: Condom  Lifestyle  . Physical activity:    Days per week: Not on file    Minutes per session: Not on file  . Stress: Not on file  Relationships  . Social connections:    Talks on phone: Not on file    Gets together: Not on file    Attends religious service: Not on file    Active member of club or organization: Not on file    Attends meetings of clubs or organizations: Not on file    Relationship status: Not on file  . Intimate partner violence:    Fear of current or ex partner: Not on file    Emotionally abused: Not on file    Physically abused: Not on file    Forced sexual activity: Not on file  Other Topics Concern  . Not on file  Social History Narrative  . Not on file     Family History  Problem Relation Age of Onset  . Hypertension Mother   . Heart disease Father        Heart Disease before age 51  . Hypertension Father   . Heart attack Father   . Hypertension Daughter     ROS: Left arm edema, pain Shortness breath.  Physical Examination  Vitals:   03/10/18 1234 03/10/18 1428  BP: 132/80   Pulse: 84   Resp:    Temp: 98.4 F (36.9 C)   SpO2: 97% 94%   Body mass index is 23.65 kg/m.  aaox3 Non labored respirations today Left upper arm is edematous and ttp with hematoma that is settling out The incisions are cdi with expected post op appearnce  Left forearm has edema, hand is sensorimotor in tact   CBC    Component Value Date/Time   WBC 4.0 03/10/2018 0247   RBC 3.06 (L) 03/10/2018 0247   HGB 9.3 (L) 03/10/2018 0247   HCT 30.8 (L) 03/10/2018 0247   PLT 106 (L) 03/10/2018 0247   MCV 100.7 (H) 03/10/2018 0247   MCH 30.4 03/10/2018 0247   MCHC 30.2 03/10/2018 0247   RDW 15.6 (H) 03/10/2018 0247   LYMPHSABS 0.9 03/09/2018 1605     MONOABS 0.4 03/09/2018 1605   EOSABS 0.0 03/09/2018 1605   BASOSABS 0.0 03/09/2018 1605    BMET    Component Value Date/Time   NA 137 03/10/2018 0247   K 4.0 03/10/2018 0247   CL 98 (L) 03/10/2018 0247   CO2 28 03/10/2018 0247   GLUCOSE 89 03/10/2018 0247   BUN 29 (H) 03/10/2018 0247   CREATININE 7.05 (H) 03/10/2018 0247   CALCIUM 8.5 (L) 03/10/2018 0247   CALCIUM 9.3 01/03/2012 1109   GFRNONAA 6 (L) 03/10/2018 0247   GFRAA 7 (L) 03/10/2018 0247    COAGS: Lab Results  Component Value Date   INR 1.08 03/09/2018   INR 0.97 04/12/2012   INR 1.03 06/15/2011     Non-Invasive Vascular Imaging:    REVISION OF BRACHIOCEPHALIC ARTERIOVENOUS FISTULA LEFT UPPER ARM USING ARTEGRAFT        Preliminary results by tech - Left Upper Ext. Venous Duplex Completed. Technically limited evaluation of the venous system due to s/p revision of AVF in the left arm. The internal jugular and radial and ulnar veins are patent without evidence of thrombus. There is a patent AVF noted. The subclavian, axillary and brachial veins not obtainable. There is a heterogeneous structure anterior to AVF in the proximal arm noted, most likely a hematoma. Oda Cogan, BS, RDMS, RVT        ASSESSMENT/PLAN: This is a 47 y.o. female here with fever s/p revision of left arm avf. The upper arm is edematous but does not appear to be infected at this time. Unfortunately, besides expected post op pain and swelling she also has had an infiltration event and this is contributing to the pain and edema. Wound not intervene on arm at this time but if is causing significant pain we can consider tunneled catheter for a short period to rest the avf and allow the arm to return to normal size from both the expected post op course and the infiltration event. Will re-evaluate tomorrow.   Jazzlin Clements C. Donzetta Matters, MD [facility] Office: [phone] Pager: [phone]

## 2018-03-11 DIAGNOSIS — D649 Anemia, unspecified: Secondary | ICD-10-CM

## 2018-03-11 DIAGNOSIS — Z992 Dependence on renal dialysis: Secondary | ICD-10-CM

## 2018-03-11 DIAGNOSIS — M7989 Other specified soft tissue disorders: Secondary | ICD-10-CM

## 2018-03-11 DIAGNOSIS — D696 Thrombocytopenia, unspecified: Secondary | ICD-10-CM

## 2018-03-11 DIAGNOSIS — N186 End stage renal disease: Secondary | ICD-10-CM

## 2018-03-11 DIAGNOSIS — I10 Essential (primary) hypertension: Secondary | ICD-10-CM

## 2018-03-11 LAB — RENAL FUNCTION PANEL
Albumin: 2.8 g/dL — ABNORMAL LOW (ref 3.5–5.0)
Anion gap: 12 (ref 5–15)
BUN: 38 mg/dL — AB (ref 6–20)
CHLORIDE: 97 mmol/L — AB (ref 101–111)
CO2: 29 mmol/L (ref 22–32)
CREATININE: 8.13 mg/dL — AB (ref 0.44–1.00)
Calcium: 8.4 mg/dL — ABNORMAL LOW (ref 8.9–10.3)
GFR calc Af Amer: 6 mL/min — ABNORMAL LOW (ref >60)
GFR, EST NON AFRICAN AMERICAN: 5 mL/min — AB (ref >60)
GLUCOSE: 86 mg/dL (ref 65–99)
POTASSIUM: 3.8 mmol/L (ref 3.5–5.1)
Phosphorus: 4.7 mg/dL — ABNORMAL HIGH (ref 2.5–4.6)
Sodium: 138 mmol/L (ref 135–145)

## 2018-03-11 LAB — CBC
HEMATOCRIT: 26.8 % — AB (ref 36.0–46.0)
Hemoglobin: 8.3 g/dL — ABNORMAL LOW (ref 12.0–15.0)
MCH: 30.4 pg (ref 26.0–34.0)
MCHC: 31 g/dL (ref 30.0–36.0)
MCV: 98.2 fL (ref 78.0–100.0)
PLATELETS: 117 10*3/uL — AB (ref 150–400)
RBC: 2.73 MIL/uL — ABNORMAL LOW (ref 3.87–5.11)
RDW: 15.5 % (ref 11.5–15.5)
WBC: 2.2 10*3/uL — AB (ref 4.0–10.5)

## 2018-03-11 LAB — LEGIONELLA PNEUMOPHILA SEROGP 1 UR AG: L. pneumophila Serogp 1 Ur Ag: NEGATIVE

## 2018-03-11 MED ORDER — VANCOMYCIN HCL IN DEXTROSE 750-5 MG/150ML-% IV SOLN: 750.0000 mg | Freq: Once | INTRAVENOUS | Status: DC

## 2018-03-11 MED ORDER — ALBUTEROL SULFATE HFA 108 (90 BASE) MCG/ACT IN AERS: 2.0000 | INHALATION_SPRAY | Freq: Four times a day (QID) | RESPIRATORY_TRACT | 2 refills | Status: AC | PRN

## 2018-03-11 MED ORDER — ALBUTEROL SULFATE HFA 108 (90 BASE) MCG/ACT IN AERS: 2.0000 | INHALATION_SPRAY | Freq: Four times a day (QID) | RESPIRATORY_TRACT | 2 refills | Status: DC | PRN

## 2018-03-11 MED ORDER — AMOXICILLIN-POT CLAVULANATE 250-62.5 MG/5ML PO SUSR: 500.0000 mg | Freq: Every day | ORAL | 0 refills | Status: AC

## 2018-03-11 MED ORDER — HYDROMORPHONE HCL 1 MG/ML IJ SOLN
INTRAMUSCULAR | Status: AC
  Filled 2018-03-11: qty 0.5

## 2018-03-11 MED ORDER — IPRATROPIUM-ALBUTEROL 0.5-2.5 (3) MG/3ML IN SOLN: 3.0000 mL | Freq: Three times a day (TID) | RESPIRATORY_TRACT | Status: DC

## 2018-03-11 MED ORDER — AMOXICILLIN-POT CLAVULANATE 875-125 MG PO TABS: 1.0000 | ORAL_TABLET | Freq: Every day | ORAL | 0 refills | Status: DC

## 2018-03-11 MED ORDER — AMOXICILLIN-POT CLAVULANATE 250-62.5 MG/5ML PO SUSR
500.0000 mg | ORAL | Status: DC
  Filled 2018-03-11: qty 10

## 2018-03-11 NOTE — Progress Notes (Signed)
Pt is off the floor this time, left for HD around 11.45pm, RN gave her pain medicine and Xanax this am.  Palma Holter, RN

## 2018-03-11 NOTE — Discharge Summary (Signed)
Physician Discharge Summary  [name] ZWC:[id number] DOB: [birth year] DOA: 03/09/2018  PCP: Enid Skeens., MD  Admit date: 03/09/2018 Discharge date: 03/11/2018  Admitted From: Home  Disposition:  Home   Recommendations for Outpatient Follow-up:  1. Follow up with PCP in 1-2 weeks  Home Health: Yes  Equipment/Devices: None  Discharge Condition: Good  CODE STATUS: FULL Diet recommendation: Renal  Brief/Interim Summary: [name] is a 47 y.o. F with ESRD on HD MWF, HTN, and recent AV graft revision who presents with fever and chills in the setting of new cough as well as new arm redness and pain.      Acute hypoxic respiratory failure Healthcare associated pneumonia CT scan showed scattered bronchopneumonia, consistent with aspiration pneumonia in bilateral lungs.  Vomiting was reported.  Influenza negative, strep and legionella urine antigens negative, troponin negative.  Lactate negative.  Cultures NG.  Treated with Unasyn and no new fever.  Patient was afebrile for 48 hours, HR and RR normalized, she was mentating well, weaned off O2.  I recommended 24 further hours IV antibiotics and reconsideration of TDC, but patient and husband both requested discharge to home.    Discharged with 4 more days Augmentin 250-62.5 mg/5 mL suspension, to take 500 mg amoxicillin nightly for 4 more days.    Arm pain [place] of arm shows no thrombosis.  Vancomycin stopped.  No evidence for infection.  I reviewed relative benefits of sparing left arm and using tunneled catheter vs quite low risk of introducing infection by placement of TDC; I recommended in clear terms that she get [facility] due to risks of repeat infiltration/bleeding/possible loss of graft, but she refused emphatically.    Thrombocytopenia Improving -Repeat CBC in 2 weeks  Hypertension  End-stage renal disease  Anemia of chronic renal disease  Reactive airway disease  OSA  Chronic  pain          Discharge Diagnoses:  Principal Problem:   HCAP (healthcare-associated pneumonia) Active Problems:   HTN (hypertension)   Anemia   ESRD on dialysis [facility])   Left upper extremity swelling    Discharge Instructions  Discharge Instructions    Discharge instructions   Complete by:  As directed    From Dr. Loleta Books: You were admitted for fever. This turned out to be from a pneumonia, so you were started on antibiotics. Take Augmentin solution 10 mL (500-125 mg) once daily at night starting tomorrow night for the next 4 nights, then stop There may be a little left over in the bottle (because there was enough in the prescription I sent for 5 doses, although you only need 4).  The arm does not appear infected at this time, and the ultrasound final report came back Saturday and showed exactly what we talked about in the preliminary report: no clots, just the hematoma that we knew about.  I am glad the swelling is better.  Continue to care for that arm as you have been.  Call your primary care doctor for a follow up in the next 2 weeks   Increase activity slowly   Complete by:  As directed      Allergies as of 03/11/2018      Reactions   Nsaids Other (See Comments)   Renal failure   Tolmetin Other (See Comments)   Renal failure   Clindamycin/lincomycin Hives, Rash   Demerol Nausea And Vomiting, Other (See Comments)   Severe headaches   Fentanyl Hives, Nausea And Vomiting, Rash, Other (See Comments)  Reaction to patches only   Other Other (See Comments)   Seeds and nuts- has diverticulitis    Vancomycin Hives, Other (See Comments)   ? 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   Clarithromycin Nausea And Vomiting   Morphine And Related Nausea And Vomiting, Other (See Comments)   Severe headache, Pt does not want it!      Medication List    TAKE these medications   acetaminophen 325 MG tablet Commonly known as:  TYLENOL Take 2 tablets (650 mg total) by mouth every 8 (eight)  hours as needed for mild pain, fever or headache.   albuterol 108 (90 Base) MCG/ACT inhaler Commonly known as:  PROVENTIL HFA;VENTOLIN HFA Inhale 2 puffs into the lungs every 6 (six) hours as needed for shortness of breath.   ALPRAZolam 1 MG tablet Commonly known as:  XANAX Take 1 mg by mouth 3 (three) times daily.   amLODipine 10 MG tablet Commonly known as:  NORVASC Take 10 mg by mouth at bedtime.   amoxicillin-clavulanate 250-62.5 MG/5ML suspension Commonly known as:  AUGMENTIN Take 10 mLs (500 mg total) by mouth daily at 8 pm for 5 days.   budesonide-formoterol 160-4.5 MCG/ACT inhaler Commonly known as:  SYMBICORT Inhale 2 puffs into the lungs 2 (two) times daily.   buPROPion 150 MG 12 hr tablet Commonly known as:  WELLBUTRIN SR Take 150 mg by mouth daily.   calcium acetate 667 MG capsule Commonly known as:  PHOSLO Take 2,001 mg by mouth 2 (two) times daily with a meal.   cyclobenzaprine 10 MG tablet Commonly known as:  FLEXERIL Take 20 mg by mouth at bedtime.   HYDROmorphone 2 MG tablet Commonly known as:  DILAUDID Take 0.5 tablets (1 mg total) by mouth every 6 (six) hours as needed for moderate pain or severe pain.   lidocaine-prilocaine cream Commonly known as:  EMLA Apply 1 application topically See admin instructions. Apply topically one hour prior to dialysis - Monday, Wednesday, Friday   Melatonin 5 MG Tabs Take 5 mg by mouth at bedtime.   metoprolol tartrate 25 MG tablet Commonly known as:  LOPRESSOR Take 1 tablet (25 mg total) by mouth 2 (two) times daily.   montelukast 10 MG tablet Commonly known as:  SINGULAIR Take 10 mg by mouth daily.   multivitamin Tabs tablet Take 1 tablet by mouth daily.   nicotine 14 mg/24hr patch Commonly known as:  NICODERM CQ - dosed in mg/24 hours Place 1 patch (14 mg total) onto the skin daily.   sevelamer carbonate 800 MG tablet Commonly known as:  RENVELA Take 2,400 mg by mouth 2 (two) times daily with a  meal.   SUMAtriptan 50 MG tablet Commonly known as:  IMITREX Take 50 mg by mouth daily as needed for migraine or headache.   XTAMPZA ER 36 MG C12a Generic drug:  oxyCODONE ER Take 36 mg by mouth 2 (two) times daily.       Allergies  Allergen Reactions  . Nsaids Other (See Comments)    Renal failure  . Tolmetin Other (See Comments)    Renal failure  . Clindamycin/Lincomycin Hives and Rash  . Demerol Nausea And Vomiting and Other (See Comments)    Severe headaches  . Fentanyl Hives, Nausea And Vomiting, Rash and Other (See Comments)    Reaction to patches only  . Other Other (See Comments)    Seeds and nuts- has diverticulitis   . Vancomycin Hives and Other (See Comments)    ? 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  Marland Kitchen  Clarithromycin Nausea And Vomiting  . Morphine And Related Nausea And Vomiting and Other (See Comments)    Severe headache, Pt does not want it!    Consultations:  Nephrology  Vascular surgery   Procedures/Studies: Dg Chest 2 View  Result Date: 03/09/2018 CLINICAL DATA:  Shortness of breath EXAM: CHEST - 2 VIEW COMPARISON:  None. FINDINGS: Unchanged mild cardiomegaly. Both lungs are clear. The visualized skeletal structures are unremarkable. IMPRESSION: Unchanged mild cardiomegaly without pulmonary edema. Electronically Signed   By: Ulyses Jarred M.D.   On: 03/09/2018 16:33   Ct Angio Chest Pe W And/or Wo Contrast  Result Date: 03/09/2018 CLINICAL DATA:  Dyspnea x2 days.  Patient had surgery 8 days ago. EXAM: CT ANGIOGRAPHY CHEST WITH CONTRAST TECHNIQUE: Multidetector CT imaging of the chest was performed using the standard protocol during bolus administration of intravenous contrast. Multiplanar CT image reconstructions and MIPs were obtained to evaluate the vascular anatomy. CONTRAST:  67 cc ISOVUE-370 IOPAMIDOL (ISOVUE-370) INJECTION 76% COMPARISON:  CXR 03/09/2018 FINDINGS: Cardiovascular: Cardiomegaly with left main and three-vessel coronary arteriosclerosis. No pericardial  effusion. Conventional branch pattern of the great vessels without significant stenosis. No aortic aneurysm. No acute pulmonary embolus to the segmental level. Mediastinum/Nodes: Mildly enlarged aorticopulmonary, paratracheal, subcarinal and bilateral hilar lymph nodes. Lungs/Pleura: Ill-defined, somewhat rounded acinar opacities/nodules are seen bilaterally, multilobar in appearance, greatest in the left upper lobe, lingula and left lower lobe with lesser degrees in the right lower lobe and right upper lobe. These more likely represent stigmata of bronchopneumonia though can be seen in the setting of aspiration, vasculitis or less likely neoplasm. No effusion or pneumothorax. Upper Abdomen: No acute abnormality. Musculoskeletal: No chest wall abnormality. No acute or significant osseous findings. Review of the MIP images confirms the above findings. IMPRESSION: 1. Multilobar acinar opacities more commonly associated with bronchopneumonia. Followup PA and lateral chest X-ray is recommended in 3-4 weeks following trial of antibiotic therapy to ensure resolution and exclude underlying malignancy. 2. Likely mild mediastinal and bilateral reactive adenopathy. 3. Coronary arteriosclerosis. 4. No acute pulmonary embolus. Electronically Signed   By: Ashley Royalty M.D.   On: 03/09/2018 19:51       Subjective: Arm pain and swelling much better this afternoon after dialysis.  No more fever.  Still with cough, dyspnea completely resolved. Still wheezing.      Discharge Exam: Vitals:   03/11/18 1600 03/11/18 1656  BP: 125/76 120/70  Pulse: 80   Resp: 18   Temp: 98 F (36.7 C)   SpO2: 100%    Vitals:   03/11/18 1500 03/11/18 1530 03/11/18 1600 03/11/18 1656  BP: 132/80 (!) 130/43 125/76 120/70  Pulse: 74 73 80   Resp:   18   Temp:   98 F (36.7 C)   TempSrc:   Oral   SpO2:   100%   Weight:   62.5 kg (137 lb 12.6 oz)   Height:        General appearance: Thin adult female, sitting on side of bed,  appears comfortable, NAD, interactive   Skin: Skin warm and dry. tthe upper left arm has a well healed scar from her recent revision, without surroudnding signs of cellulitis.  Left arm edema is mostly resolved this afternoon.  There is a painful fluid collection in the left axilla, but I do not suspect infected hematoma. Cardiac: RRR, systolic murmur is loud.  No JVP elevation.  No LE edema.  Left arm edema noted. Respiratory: Respiratory effort normal.  Coarse breath  sounds bilaterally, diminished throughout. Psych: Sensorium intact and responding to questions, attention normal. Affect anxious, extremely.  Judgment and insight appear normal.      The results of significant diagnostics from this hospitalization (including imaging, microbiology, ancillary and laboratory) are listed below for reference.     Microbiology: Recent Results (from the past 240 hour(s))  Culture, blood (Routine x 2)     Status: None (Preliminary result)   Collection Time: 03/09/18  4:05 PM  Result Value Ref Range Status   Specimen Description BLOOD RIGHT ARM  Final   Special Requests   Final    BOTTLES DRAWN AEROBIC AND ANAEROBIC Blood Culture results may not be optimal due to an excessive volume of blood received in culture bottles   Culture   Final    NO GROWTH 2 DAYS Performed at [facility], [place] [address] [place], [place] [address]    Report Status PENDING  Incomplete  Culture, blood (Routine x 2)     Status: None (Preliminary result)   Collection Time: 03/09/18  4:06 PM  Result Value Ref Range Status   Specimen Description BLOOD RIGHT ARM  Final   Special Requests   Final    BOTTLES DRAWN AEROBIC AND ANAEROBIC Blood Culture adequate volume   Culture   Final    NO GROWTH 2 DAYS Performed at [facility], [address] [place], [place] [address]    Report Status PENDING  Incomplete  MRSA PCR Screening     Status: None   Collection Time: 03/09/18 11:01 PM  Result Value Ref Range  Status   MRSA by PCR NEGATIVE NEGATIVE Final    Comment:        The GeneXpert MRSA Assay (FDA approved for NASAL specimens only), is one component of a comprehensive MRSA colonization surveillance program. It is not intended to diagnose MRSA infection nor to guide or monitor treatment for MRSA infections. Performed at [facility], [place] [address] [place], [place] [address]   Culture, sputum-assessment     Status: None   Collection Time: 03/10/18 11:48 AM  Result Value Ref Range Status   Specimen Description SPUTUM  Final   Special Requests NONE  Final   Sputum evaluation   Final    Sputum specimen not acceptable for testing.  Please recollect.   Gram Stain Report Called to,Read Back By and Verified With: R ARYAL,RN AT 1320 03/10/18 BY L BENFIELD Performed at [facility], [place] [address], [place], [place] [address]    Report Status 03/10/2018 FINAL  Final     Labs: BNP (last 3 results) No results for input(s): BNP in the last 8760 hours. Basic Metabolic Panel: Recent Labs  Lab 03/09/18 1605 03/10/18 0247 03/11/18 1413  NA 139 137 138  K 3.8 4.0 3.8  CL 97* 98* 97*  CO2 30 28 29   GLUCOSE 112* 89 86  BUN 25* 29* 38*  CREATININE 6.37* 7.05* 8.13*  CALCIUM 8.9 8.5* 8.4*  PHOS  --   --  4.7*   Liver Function Tests: Recent Labs  Lab 03/09/18 1605 03/11/18 1413  AST 29  --   ALT 5*  --   ALKPHOS 119  --   BILITOT 0.9  --   PROT 6.0*  --   ALBUMIN 3.5 2.8*   No results for input(s): LIPASE, AMYLASE in the last 168 hours. No results for input(s): AMMONIA in the last 168 hours. CBC: Recent Labs  Lab 03/09/18  1605 03/10/18 0247 03/11/18 1413  WBC 4.4 4.0 2.2*  NEUTROABS 3.1  --   --   HGB 10.1* 9.3* 8.3*  HCT 33.7* 30.8* 26.8*  MCV 100.6* 100.7* 98.2  PLT 106* 106* 117*   Cardiac Enzymes: Recent Labs  Lab 03/10/18 0247 03/10/18 0751 03/10/18 1418  TROPONINI <0.03 <0.03 <0.03   BNP: Invalid input(s): POCBNP CBG: No results for  input(s): GLUCAP in the last 168 hours. D-Dimer No results for input(s): DDIMER in the last 72 hours. Hgb A1c No results for input(s): HGBA1C in the last 72 hours. Lipid Profile No results for input(s): CHOL, HDL, LDLCALC, TRIG, CHOLHDL, LDLDIRECT in the last 72 hours. Thyroid function studies No results for input(s): TSH, T4TOTAL, T3FREE, THYROIDAB in the last 72 hours.  Invalid input(s): FREET3 Anemia work up No results for input(s): VITAMINB12, FOLATE, FERRITIN, TIBC, IRON, RETICCTPCT in the last 72 hours. Urinalysis    Component Value Date/Time   COLORURINE YELLOW 03/09/2018 [place] 03/09/2018 2328   LABSPEC 1.014 03/09/2018 2328   PHURINE 9.0 (H) 03/09/2018 2328   GLUCOSEU NEGATIVE 03/09/2018 2328   HGBUR NEGATIVE 03/09/2018 2328   BILIRUBINUR MODERATE (A) 03/09/2018 2328   KETONESUR NEGATIVE 03/09/2018 2328   PROTEINUR >=300 (A) 03/09/2018 2328   UROBILINOGEN 0.2 02/01/2015 0021   NITRITE NEGATIVE 03/09/2018 2328   LEUKOCYTESUR NEGATIVE 03/09/2018 2328   Sepsis Labs Invalid input(s): PROCALCITONIN,  WBC,  LACTICIDVEN Microbiology Recent Results (from the past 240 hour(s))  Culture, blood (Routine x 2)     Status: None (Preliminary result)   Collection Time: 03/09/18  4:05 PM  Result Value Ref Range Status   Specimen Description BLOOD RIGHT ARM  Final   Special Requests   Final    BOTTLES DRAWN AEROBIC AND ANAEROBIC Blood Culture results may not be optimal due to an excessive volume of blood received in culture bottles   Culture   Final    NO GROWTH 2 DAYS Performed at [facility], [place] [address], [place], [place] [address]    Report Status PENDING  Incomplete  Culture, blood (Routine x 2)     Status: None (Preliminary result)   Collection Time: 03/09/18  4:06 PM  Result Value Ref Range Status   Specimen Description BLOOD RIGHT ARM  Final   Special Requests   Final    BOTTLES DRAWN AEROBIC AND ANAEROBIC Blood Culture adequate volume    Culture   Final    NO GROWTH 2 DAYS Performed at [facility], [address] [place], [place] [address]    Report Status PENDING  Incomplete  MRSA PCR Screening     Status: None   Collection Time: 03/09/18 11:01 PM  Result Value Ref Range Status   MRSA by PCR NEGATIVE NEGATIVE Final    Comment:        The GeneXpert MRSA Assay (FDA approved for NASAL specimens only), is one component of a comprehensive MRSA colonization surveillance program. It is not intended to diagnose MRSA infection nor to guide or monitor treatment for MRSA infections. Performed at [facility], [place] [address], [place], [place] [address]   Culture, sputum-assessment     Status: None   Collection Time: 03/10/18 11:48 AM  Result Value Ref Range Status   Specimen Description SPUTUM  Final   Special Requests NONE  Final   Sputum evaluation   Final    Sputum specimen not acceptable for testing.  Please recollect.  Gram Stain Report Called to,Read Back By and Verified With: R ARYAL,RN AT 1320 03/10/18 BY L BENFIELD Performed at [facility], [place] [address] [place], [place] [address]    Report Status 03/10/2018 FINAL  Final     Time coordinating discharge: 45 minutes  SIGNED:   Edwin Dada, MD  [facility] 03/11/2018, 6:18 PM

## 2018-03-11 NOTE — Progress Notes (Signed)
Pt got discharged to home, discharge instructions provided and patient showed understanding to it, IV taken out,Telemonitor DC,pt left unit in wheelchair with all of the belongings accompanied with a family member (Husband) Layn Kye,RN  

## 2018-03-11 NOTE — Progress Notes (Signed)
S: Upset this morning as she is concerned about the persistent swelling and discomfort of her left arm O:BP 130/80   Pulse 76   Temp 98.5 F (36.9 C) (Oral)   Resp 16   Ht 5\' 4"  (1.626 m)   Wt 63.5 kg (139 lb 14.4 oz)   LMP 06/27/2014   SpO2 92%   BMI 24.01 kg/m   Intake/Output Summary (Last 24 hours) at 03/11/2018 1123 Last data filed at 03/11/2018 1005 Gross per 24 hour  Intake 1140 ml  Output 500 ml  Net 640 ml   Intake/Output: I/O last 3 completed shifts: In: 4 [P.O.:1260; IV Piggyback:400] Out: 800 [Urine:800]  Intake/Output this shift:  Total I/O In: 120 [P.O.:120] Out: -  Weight change: 0.862 kg (1 lb 14.4 oz) Gen: mild distress CVS: no rub Resp: cta Abd: benign Ext: LUE AVF +T/B, + ecchymoses and edema, fingers warm to touch, mild erythema at suture site, no drainage  Recent Labs  Lab 03/09/18 1605 03/10/18 0247  NA 139 137  K 3.8 4.0  CL 97* 98*  CO2 30 28  GLUCOSE 112* 89  BUN 25* 29*  CREATININE 6.37* 7.05*  ALBUMIN 3.5  --   CALCIUM 8.9 8.5*  AST 29  --   ALT 5*  --    Liver Function Tests: Recent Labs  Lab 03/09/18 1605  AST 29  ALT 5*  ALKPHOS 119  BILITOT 0.9  PROT 6.0*  ALBUMIN 3.5   No results for input(s): LIPASE, AMYLASE in the last 168 hours. No results for input(s): AMMONIA in the last 168 hours. CBC: Recent Labs  Lab 03/09/18 1605 03/10/18 0247  WBC 4.4 4.0  NEUTROABS 3.1  --   HGB 10.1* 9.3*  HCT 33.7* 30.8*  MCV 100.6* 100.7*  PLT 106* 106*   Cardiac Enzymes: Recent Labs  Lab 03/10/18 0247 03/10/18 0751 03/10/18 1418  TROPONINI <0.03 <0.03 <0.03   CBG: No results for input(s): GLUCAP in the last 168 hours.  Iron Studies: No results for input(s): IRON, TIBC, TRANSFERRIN, FERRITIN in the last 72 hours. Studies/Results: Dg Chest 2 View  Result Date: 03/09/2018 CLINICAL DATA:  Shortness of breath EXAM: CHEST - 2 VIEW COMPARISON:  None. FINDINGS: Unchanged mild cardiomegaly. Both lungs are clear. The  visualized skeletal structures are unremarkable. IMPRESSION: Unchanged mild cardiomegaly without pulmonary edema. Electronically Signed   By: Ulyses Jarred M.D.   On: 03/09/2018 16:33   Ct Angio Chest Pe W And/or Wo Contrast  Result Date: 03/09/2018 CLINICAL DATA:  Dyspnea x2 days.  Patient had surgery 8 days ago. EXAM: CT ANGIOGRAPHY CHEST WITH CONTRAST TECHNIQUE: Multidetector CT imaging of the chest was performed using the standard protocol during bolus administration of intravenous contrast. Multiplanar CT image reconstructions and MIPs were obtained to evaluate the vascular anatomy. CONTRAST:  67 cc ISOVUE-370 IOPAMIDOL (ISOVUE-370) INJECTION 76% COMPARISON:  CXR 03/09/2018 FINDINGS: Cardiovascular: Cardiomegaly with left main and three-vessel coronary arteriosclerosis. No pericardial effusion. Conventional branch pattern of the great vessels without significant stenosis. No aortic aneurysm. No acute pulmonary embolus to the segmental level. Mediastinum/Nodes: Mildly enlarged aorticopulmonary, paratracheal, subcarinal and bilateral hilar lymph nodes. Lungs/Pleura: Ill-defined, somewhat rounded acinar opacities/nodules are seen bilaterally, multilobar in appearance, greatest in the left upper lobe, lingula and left lower lobe with lesser degrees in the right lower lobe and right upper lobe. These more likely represent stigmata of bronchopneumonia though can be seen in the setting of aspiration, vasculitis or less likely neoplasm. No effusion or pneumothorax. Upper  Abdomen: No acute abnormality. Musculoskeletal: No chest wall abnormality. No acute or significant osseous findings. Review of the MIP images confirms the above findings. IMPRESSION: 1. Multilobar acinar opacities more commonly associated with bronchopneumonia. Followup PA and lateral chest X-ray is recommended in 3-4 weeks following trial of antibiotic therapy to ensure resolution and exclude underlying malignancy. 2. Likely mild mediastinal and  bilateral reactive adenopathy. 3. Coronary arteriosclerosis. 4. No acute pulmonary embolus. Electronically Signed   By: Ashley Royalty M.D.   On: 03/09/2018 19:51   . amLODipine  10 mg Oral QHS  . buPROPion  150 mg Oral Daily  . [START ON 03/13/2018] calcitRIOL  0.75 mcg Oral Q M,W,F-HD  . calcium acetate  2,001 mg Oral BID WC  . cyclobenzaprine  20 mg Oral QHS  . [START ON 03/15/2018] darbepoetin (ARANESP) injection - DIALYSIS  100 mcg Intravenous Q Wed-HD  . heparin  5,000 Units Subcutaneous Q8H  . ipratropium-albuterol  3 mL Nebulization TID  . mouth rinse  15 mL Mouth Rinse BID  . metoprolol tartrate  25 mg Oral BID  . mometasone-formoterol  2 puff Inhalation BID  . montelukast  10 mg Oral Daily  . multivitamin  1 tablet Oral QHS  . oxyCODONE  30 mg Oral Q12H  . sevelamer carbonate  2,400 mg Oral BID WC    BMET    Component Value Date/Time   NA 137 03/10/2018 0247   K 4.0 03/10/2018 0247   CL 98 (L) 03/10/2018 0247   CO2 28 03/10/2018 0247   GLUCOSE 89 03/10/2018 0247   BUN 29 (H) 03/10/2018 0247   CREATININE 7.05 (H) 03/10/2018 0247   CALCIUM 8.5 (L) 03/10/2018 0247   CALCIUM 9.3 01/03/2012 1109   GFRNONAA 6 (L) 03/10/2018 0247   GFRAA 7 (L) 03/10/2018 0247   CBC    Component Value Date/Time   WBC 4.0 03/10/2018 0247   RBC 3.06 (L) 03/10/2018 0247   HGB 9.3 (L) 03/10/2018 0247   HCT 30.8 (L) 03/10/2018 0247   PLT 106 (L) 03/10/2018 0247   MCV 100.7 (H) 03/10/2018 0247   MCH 30.4 03/10/2018 0247   MCHC 30.2 03/10/2018 0247   RDW 15.6 (H) 03/10/2018 0247   LYMPHSABS 0.9 03/09/2018 1605   MONOABS 0.4 03/09/2018 1605   EOSABS 0.0 03/09/2018 1605   BASOSABS 0.0 03/09/2018 1605   Dialysis Orders: Center: [facility]   on MWF . EDW 63.5 kg HD Bath 2k 2ca  Time 3.5 hr Heparin none. Access LUA AVF    Calcitriol 0.75 mcg po HD MIrcera 75 mcg Last on   03/08/18    Venofer  50mg  q hd   Assessment/Plan 1. Pneumonia/ Bronchitis = per admit team RX.  CTA c/w bronchopneumonia,  negative for PE 2. ESRD -  HD MWF =k and vol ok  was seen by Dr.Cain who offered placement of TDC, however she declined and we will attempt HD with distal aspect of avf above suture 3. Left upper extrem /Avf swelling  Sp revision , Dr Donzetta Matters has evaluated pt and offered HD cath however she did not want another procedure.  Will attempt cannulation however I told her that she may require a TDC if it is unsuccessful given limited area for cannulation and prior infiltration.   4. Hypertension/volume  - b p ok and vol stable / op meds Amlodipine 10 mg hs and Lopressor 25 mg bid  5. Anemia  - hgb 9.3  esa next week  6. Metabolic bone disease -  Renvela ac po vit d on hd  7. Anxiety / ADD/ Depression meds pewr admit 8. Chronic pain syndrome - meds per admit  9. Nutrition -  Alb 3.5  renal diet / renal vit    Donetta Potts, MD [facility] [phone]

## 2018-03-11 NOTE — Progress Notes (Signed)
Pharmacy Antibiotic Note  [name] is a 47 y.o. female admitted on 03/09/2018 with cellulitis.  Pharmacy has been consulted for vancomycin dosing. Also on Unasyn per MD for PNA. ESRD on HD MWF, no HD on 5/10, currently in HD off-schedule 5/11 - will enter vancomycin dose for post-HD today.  Noted hx of possible Red Man syndrome with vancomycin documented in the past but has tolerated vancomycin here so far this admit - will lengthen infusion time to help patient tolerate doses.  Plan: Vancomycin 750mg  IV x 1 today for off-schedule HD Vancomycin 750mg  IV qHD MWF Unasyn 1.5mg  IV q12h Monitor clinical progress, c/s, abx plan/LOT Pre-HD vancomycin level as indicated F/u HD schedule/tolerance inpatient   Height: 5\' 4"  (162.6 cm) Weight: 140 lb 10.5 oz (63.8 kg) IBW/kg (Calculated) : 54.7  Temp (24hrs), Avg:98.4 F (36.9 C), Min:98 F (36.7 C), Max:99.1 F (37.3 C)  Recent Labs  Lab 03/09/18 1604 03/09/18 1605 03/09/18 1824 03/10/18 0247 03/11/18 1413  WBC  --  4.4  --  4.0 2.2*  CREATININE  --  6.37*  --  7.05* 8.13*  LATICACIDVEN 0.82  --  0.67  --   --     Estimated Creatinine Clearance: 7.5 mL/min (A) (by C-G formula based on SCr of 8.13 mg/dL (H)).    Allergies  Allergen Reactions  . Nsaids Other (See Comments)    Renal failure  . Tolmetin Other (See Comments)    Renal failure  . Clindamycin/Lincomycin Hives and Rash  . Demerol Nausea And Vomiting and Other (See Comments)    Severe headaches  . Fentanyl Hives, Nausea And Vomiting, Rash and Other (See Comments)    Reaction to patches only  . Other Other (See Comments)    Seeds and nuts- has diverticulitis   . Vancomycin Hives and Other (See Comments)    ? 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  . Clarithromycin Nausea And Vomiting  . Morphine And Related Nausea And Vomiting and Other (See Comments)    Severe headache, Pt does not want it!   Elicia Lamp, PharmD, BCPS Clinical Pharmacist Clinical phone for 03/11/2018 until  3:30pm: [phone] If after 3:30pm, please call main pharmacy at: [phone] 03/11/2018 3:36 PM

## 2018-03-11 NOTE — Progress Notes (Signed)
PROGRESS NOTE    [name]  PPJ:[id number] DOB: 09/10/1971 DOA: 03/09/2018 PCP: Enid Skeens., MD      Brief Narrative:  [name] is a 47 y.o. F with ESRD on HD MWF, HTN, and recent AV graft revision who presents with fever and chills in the setting of new cough as well as new arm redness and pain.   Assessment & Plan:  Acute hypoxic respiratory failure Healthcare associated pneumonia CT scan shows scattered bronchopneumonia, consistent with aspiration, airspace disease in bilateral lungs.  Influenza negative, strep urine antigen negative, troponin negative.  Legionella urine antigen negative.  Lactate negative.  Cultures NG.  No new fever. -Continue Unasyn    Arm pain [place] of arm shows no thrombosis. -Stop vancomycin, doubt infection -I reviewed relative benefits of sparing left arm and using tunneled catheter vs quite low risk of introducing infection by placement of TDC; I recommended that she allow Dr. Donzetta Matters to place [facility] as he had offered, but she has fears of catheter infection, and refuses emphatically -Appreciate vascular surgery cares   Thrombocytopenia Improving -Trend CBC  Hypertension -Continue BB and amlodipine -Continue PRN IV hydralazine -Resume home lisinopril on d/c  End-stage renal disease -Consulted nephrology, appreciate cares  Anemia of chronic renal disease Stable  Reactive airway disease -Continue singular, Dulera, nebs  OSA Not on CPAP  Chronic pain -Continue home Oxycodone, bupropion, flexeril    DVT prophylaxis: Heparin Code Status: Full code Family Communication: Mother at the bedside MDM and disposition Plan: The below imaginga nd labs were reviewed.  She was admitted with hypoxic respiratory failure.  Her O2 has been weaned off and she appears to be improving. Given the multifocal nature of her infection and her ESRD, I will maintain her as an inpatietn for more IV antibiotics, discharge tomorrow likely if  cleared by Vascular surgery for finishing a course of Augmentin.      Consultants:   Vascular surgery  Nephrology   Procedures:   LUE [place]  Antimicrobials:   Vancomycin 5/9 >>  Unasyn 5/10 >>    Subjective: Arm still severe pain, no other changes.  Left arm swelling unchanged.  No more fever.  Still with cough and dyspnea, although this is better.  No confusion, weakness, loss of consciousness.  Objective: Vitals:   03/11/18 1300 03/11/18 1330 03/11/18 1400 03/11/18 1430  BP: (!) 149/62 (!) 149/80 (!) 149/87 (!) 144/87  Pulse: 72 80 81 76  Resp:      Temp:      TempSrc:      SpO2:      Weight:      Height:        Intake/Output Summary (Last 24 hours) at 03/11/2018 1553 Last data filed at 03/11/2018 1005 Gross per 24 hour  Intake 800 ml  Output 500 ml  Net 300 ml   Filed Weights   03/10/18 0459 03/11/18 0440 03/11/18 1220  Weight: 62.5 kg (137 lb 12.6 oz) 63.5 kg (139 lb 14.4 oz) 63.8 kg (140 lb 10.5 oz)    Examination: General appearance: Thin adult female, sitting on side of bed, holding arm, clearly in pain and distressed   HEENT: Anicteric, conjunctiva pink, lids and lashes normal. No nasal deformity, discharge, epistaxis.  Lips moist, upper teeth missing, no oral lesions, OP moist.   Skin: Skin warm and dry. tthe upper left arm has a well healed scar from her recent revision, without surroudnding signs of cellulitis.  There is overall edema to that  arm, mild.  There is a painful fluid collection in the left axilla, but I do not suspect infected hematoma. Cardiac: RRR, systolic murmur is loud.  No JVP elevation.  No LE edema.  Left arm edema noted. Respiratory: Respiratory effort normal.  Coarse breath sounds bilaterally, diminished throughout. Abdomen: Abdomen soft.  No TTP. MSK: No deformities or effusions. Neuro: Awake and alert.  EOMI, moves all extremities. Speech fluent.    Psych: Sensorium intact and responding to questions, attention normal. Affect  anxious, extremely.  Judgment and insight appear normal.    Data Reviewed: I have personally reviewed following labs and imaging studies:  CBC: Recent Labs  Lab 03/09/18 1605 03/10/18 0247 03/11/18 1413  WBC 4.4 4.0 2.2*  NEUTROABS 3.1  --   --   HGB 10.1* 9.3* 8.3*  HCT 33.7* 30.8* 26.8*  MCV 100.6* 100.7* 98.2  PLT 106* 106* 151*   Basic Metabolic Panel: Recent Labs  Lab 03/09/18 1605 03/10/18 0247 03/11/18 1413  NA 139 137 138  K 3.8 4.0 3.8  CL 97* 98* 97*  CO2 30 28 29   GLUCOSE 112* 89 86  BUN 25* 29* 38*  CREATININE 6.37* 7.05* 8.13*  CALCIUM 8.9 8.5* 8.4*  PHOS  --   --  4.7*   GFR: Estimated Creatinine Clearance: 7.5 mL/min (A) (by C-G formula based on SCr of 8.13 mg/dL (H)). Liver Function Tests: Recent Labs  Lab 03/09/18 1605 03/11/18 1413  AST 29  --   ALT 5*  --   ALKPHOS 119  --   BILITOT 0.9  --   PROT 6.0*  --   ALBUMIN 3.5 2.8*   No results for input(s): LIPASE, AMYLASE in the last 168 hours. No results for input(s): AMMONIA in the last 168 hours. Coagulation Profile: Recent Labs  Lab 03/09/18 1605  INR 1.08   Cardiac Enzymes: Recent Labs  Lab 03/10/18 0247 03/10/18 0751 03/10/18 1418  TROPONINI <0.03 <0.03 <0.03   BNP (last 3 results) No results for input(s): PROBNP in the last 8760 hours. HbA1C: No results for input(s): HGBA1C in the last 72 hours. CBG: No results for input(s): GLUCAP in the last 168 hours. Lipid Profile: No results for input(s): CHOL, HDL, LDLCALC, TRIG, CHOLHDL, LDLDIRECT in the last 72 hours. Thyroid Function Tests: No results for input(s): TSH, T4TOTAL, FREET4, T3FREE, THYROIDAB in the last 72 hours. Anemia Panel: No results for input(s): VITAMINB12, FOLATE, FERRITIN, TIBC, IRON, RETICCTPCT in the last 72 hours. Urine analysis:    Component Value Date/Time   COLORURINE YELLOW 03/09/2018 2328   APPEARANCEUR CLEAR 03/09/2018 2328   LABSPEC 1.014 03/09/2018 2328   PHURINE 9.0 (H) 03/09/2018 2328    GLUCOSEU NEGATIVE 03/09/2018 2328   HGBUR NEGATIVE 03/09/2018 2328   BILIRUBINUR MODERATE (A) 03/09/2018 2328   KETONESUR NEGATIVE 03/09/2018 2328   PROTEINUR >=300 (A) 03/09/2018 2328   UROBILINOGEN 0.2 02/01/2015 0021   NITRITE NEGATIVE 03/09/2018 2328   LEUKOCYTESUR NEGATIVE 03/09/2018 2328   Sepsis Labs: @LABRCNTIP (procalcitonin:4,lacticacidven:4)  ) Recent Results (from the past 240 hour(s))  Culture, blood (Routine x 2)     Status: None (Preliminary result)   Collection Time: 03/09/18  4:05 PM  Result Value Ref Range Status   Specimen Description BLOOD RIGHT ARM  Final   Special Requests   Final    BOTTLES DRAWN AEROBIC AND ANAEROBIC Blood Culture results may not be optimal due to an excessive volume of blood received in culture bottles   Culture   Final  NO GROWTH 2 DAYS Performed at [facility], [place] [address] [place], [place] [address]    Report Status PENDING  Incomplete  Culture, blood (Routine x 2)     Status: None (Preliminary result)   Collection Time: 03/09/18  4:06 PM  Result Value Ref Range Status   Specimen Description BLOOD RIGHT ARM  Final   Special Requests   Final    BOTTLES DRAWN AEROBIC AND ANAEROBIC Blood Culture adequate volume   Culture   Final    NO GROWTH 2 DAYS Performed at [facility], [address] [place], [place] [address]    Report Status PENDING  Incomplete  MRSA PCR Screening     Status: None   Collection Time: 03/09/18 11:01 PM  Result Value Ref Range Status   MRSA by PCR NEGATIVE NEGATIVE Final    Comment:        The GeneXpert MRSA Assay (FDA approved for NASAL specimens only), is one component of a comprehensive MRSA colonization surveillance program. It is not intended to diagnose MRSA infection nor to guide or monitor treatment for MRSA infections. Performed at [facility], [place] [address] [place], [place] [address]   Culture, sputum-assessment     Status: None   Collection Time: 03/10/18 11:48 AM   Result Value Ref Range Status   Specimen Description SPUTUM  Final   Special Requests NONE  Final   Sputum evaluation   Final    Sputum specimen not acceptable for testing.  Please recollect.   Gram Stain Report Called to,Read Back By and Verified With: R ARYAL,RN AT 1320 03/10/18 BY L BENFIELD Performed at [facility], [place] [address], [place], [place] [address]    Report Status 03/10/2018 FINAL  Final         Radiology Studies: Dg Chest 2 View  Result Date: 03/09/2018 CLINICAL DATA:  Shortness of breath EXAM: CHEST - 2 VIEW COMPARISON:  None. FINDINGS: Unchanged mild cardiomegaly. Both lungs are clear. The visualized skeletal structures are unremarkable. IMPRESSION: Unchanged mild cardiomegaly without pulmonary edema. Electronically Signed   By: Ulyses Jarred M.D.   On: 03/09/2018 16:33   Ct Angio Chest Pe W And/or Wo Contrast  Result Date: 03/09/2018 CLINICAL DATA:  Dyspnea x2 days.  Patient had surgery 8 days ago. EXAM: CT ANGIOGRAPHY CHEST WITH CONTRAST TECHNIQUE: Multidetector CT imaging of the chest was performed using the standard protocol during bolus administration of intravenous contrast. Multiplanar CT image reconstructions and MIPs were obtained to evaluate the vascular anatomy. CONTRAST:  67 cc ISOVUE-370 IOPAMIDOL (ISOVUE-370) INJECTION 76% COMPARISON:  CXR 03/09/2018 FINDINGS: Cardiovascular: Cardiomegaly with left main and three-vessel coronary arteriosclerosis. No pericardial effusion. Conventional branch pattern of the great vessels without significant stenosis. No aortic aneurysm. No acute pulmonary embolus to the segmental level. Mediastinum/Nodes: Mildly enlarged aorticopulmonary, paratracheal, subcarinal and bilateral hilar lymph nodes. Lungs/Pleura: Ill-defined, somewhat rounded acinar opacities/nodules are seen bilaterally, multilobar in appearance, greatest in the left upper lobe, lingula and left lower lobe with lesser degrees in the right lower lobe and right  upper lobe. These more likely represent stigmata of bronchopneumonia though can be seen in the setting of aspiration, vasculitis or less likely neoplasm. No effusion or pneumothorax. Upper Abdomen: No acute abnormality. Musculoskeletal: No chest wall abnormality. No acute or significant osseous findings. Review of the MIP images confirms the above findings. IMPRESSION: 1. Multilobar acinar opacities more commonly associated with bronchopneumonia. Followup PA and lateral chest X-ray is recommended  in 3-4 weeks following trial of antibiotic therapy to ensure resolution and exclude underlying malignancy. 2. Likely mild mediastinal and bilateral reactive adenopathy. 3. Coronary arteriosclerosis. 4. No acute pulmonary embolus. Electronically Signed   By: Ashley Royalty M.D.   On: 03/09/2018 19:51        Scheduled Meds: . amLODipine  10 mg Oral QHS  . buPROPion  150 mg Oral Daily  . [START ON 03/13/2018] calcitRIOL  0.75 mcg Oral Q M,W,F-HD  . calcium acetate  2,001 mg Oral BID WC  . cyclobenzaprine  20 mg Oral QHS  . [START ON 03/15/2018] darbepoetin (ARANESP) injection - DIALYSIS  100 mcg Intravenous Q Wed-HD  . heparin  5,000 Units Subcutaneous Q8H  . ipratropium-albuterol  3 mL Nebulization TID  . mouth rinse  15 mL Mouth Rinse BID  . metoprolol tartrate  25 mg Oral BID  . mometasone-formoterol  2 puff Inhalation BID  . montelukast  10 mg Oral Daily  . multivitamin  1 tablet Oral QHS  . oxyCODONE  30 mg Oral Q12H  . sevelamer carbonate  2,400 mg Oral BID WC   Continuous Infusions: . ampicillin-sulbactam (UNASYN) IV Stopped (03/11/18 0921)  . [START ON 03/15/2018] ferric gluconate (FERRLECIT/NULECIT) IV    . vancomycin       LOS: 2 days    Time spent: 25 minutes    Edwin Dada, MD [facility] 03/11/2018, 1015    Pager [phone] --- please page though AMION:  [email or web address] If 7PM-7AM, please contact night-coverage

## 2018-03-11 NOTE — Progress Notes (Signed)
  Progress Note    03/11/2018 9:12 AM * No surgery found *  Subjective: Complains of significant pain and swelling in the entire left upper extremity  Vitals:   03/11/18 0440 03/11/18 0845  BP: 134/81 130/80  Pulse: 72 76  Resp: 16   Temp: 98.5 F (36.9 C)   SpO2: 98%     Physical Exam: Awake alert and oriented, tearful on exam Incisions are healing well without erythema Ecchymosis of the upper part of the left upper arm settling on the medial aspect with associated swelling from previous infiltration of event  CBC    Component Value Date/Time   WBC 4.0 03/10/2018 0247   RBC 3.06 (L) 03/10/2018 0247   HGB 9.3 (L) 03/10/2018 0247   HCT 30.8 (L) 03/10/2018 0247   PLT 106 (L) 03/10/2018 0247   MCV 100.7 (H) 03/10/2018 0247   MCH 30.4 03/10/2018 0247   MCHC 30.2 03/10/2018 0247   RDW 15.6 (H) 03/10/2018 0247   LYMPHSABS 0.9 03/09/2018 1605   MONOABS 0.4 03/09/2018 1605   EOSABS 0.0 03/09/2018 1605   BASOSABS 0.0 03/09/2018 1605    BMET    Component Value Date/Time   NA 137 03/10/2018 0247   K 4.0 03/10/2018 0247   CL 98 (L) 03/10/2018 0247   CO2 28 03/10/2018 0247   GLUCOSE 89 03/10/2018 0247   BUN 29 (H) 03/10/2018 0247   CREATININE 7.05 (H) 03/10/2018 0247   CALCIUM 8.5 (L) 03/10/2018 0247   CALCIUM 9.3 01/03/2012 1109   GFRNONAA 6 (L) 03/10/2018 0247   GFRAA 7 (L) 03/10/2018 0247    INR    Component Value Date/Time   INR 1.08 03/09/2018 1605     Intake/Output Summary (Last 24 hours) at 03/11/2018 0912 Last data filed at 03/11/2018 7106 Gross per 24 hour  Intake 1020 ml  Output 600 ml  Net 420 ml     Assessment:  47 y.o. female is s/p left upper arm fistula revision with interposition Artegraft placement.  She is admitted with pneumonia.  She has significant pain in the entire left upper extremity.  Plan: Does not need antibiotics from a fistula standpoint as it does not appear infected I have offered her tunneled dialysis catheter for a few  weeks to rest the left upper arm but at this point she is more interested in going home.  There is an area on the most lateral aspect of the fistula that can still be cannulated for dialysis I am just concerned that it will infiltrate as well or will continue to cause pain and delayed the healing process.  If she changes her mind I am happy to place a tunnel catheter this weekend.   Brandon C. Donzetta Matters, MD [facility] Office: [phone] Pager: [phone]  03/11/2018 9:12 AM

## 2018-03-14 LAB — CULTURE, BLOOD (ROUTINE X 2)
Culture: NO GROWTH
Culture: NO GROWTH
SPECIAL REQUESTS: ADEQUATE

## 2018-03-15 DIAGNOSIS — N2581 Secondary hyperparathyroidism of renal origin: Secondary | ICD-10-CM | POA: Diagnosis not present

## 2018-03-15 DIAGNOSIS — D631 Anemia in chronic kidney disease: Secondary | ICD-10-CM | POA: Diagnosis not present

## 2018-03-15 DIAGNOSIS — D509 Iron deficiency anemia, unspecified: Secondary | ICD-10-CM | POA: Diagnosis not present

## 2018-03-15 DIAGNOSIS — N186 End stage renal disease: Secondary | ICD-10-CM | POA: Diagnosis not present

## 2018-03-17 DIAGNOSIS — D631 Anemia in chronic kidney disease: Secondary | ICD-10-CM | POA: Diagnosis not present

## 2018-03-17 DIAGNOSIS — N2581 Secondary hyperparathyroidism of renal origin: Secondary | ICD-10-CM | POA: Diagnosis not present

## 2018-03-17 DIAGNOSIS — D509 Iron deficiency anemia, unspecified: Secondary | ICD-10-CM | POA: Diagnosis not present

## 2018-03-17 DIAGNOSIS — N186 End stage renal disease: Secondary | ICD-10-CM | POA: Diagnosis not present

## 2018-03-20 DIAGNOSIS — G894 Chronic pain syndrome: Secondary | ICD-10-CM | POA: Diagnosis not present

## 2018-03-20 DIAGNOSIS — D631 Anemia in chronic kidney disease: Secondary | ICD-10-CM | POA: Diagnosis not present

## 2018-03-20 DIAGNOSIS — R011 Cardiac murmur, unspecified: Secondary | ICD-10-CM | POA: Diagnosis not present

## 2018-03-20 DIAGNOSIS — I120 Hypertensive chronic kidney disease with stage 5 chronic kidney disease or end stage renal disease: Secondary | ICD-10-CM | POA: Diagnosis not present

## 2018-03-20 DIAGNOSIS — N186 End stage renal disease: Secondary | ICD-10-CM | POA: Diagnosis not present

## 2018-03-20 DIAGNOSIS — T82510D Breakdown (mechanical) of surgically created arteriovenous fistula, subsequent encounter: Secondary | ICD-10-CM | POA: Diagnosis not present

## 2018-03-20 DIAGNOSIS — D509 Iron deficiency anemia, unspecified: Secondary | ICD-10-CM | POA: Diagnosis not present

## 2018-03-20 DIAGNOSIS — Z992 Dependence on renal dialysis: Secondary | ICD-10-CM | POA: Diagnosis not present

## 2018-03-20 DIAGNOSIS — N2581 Secondary hyperparathyroidism of renal origin: Secondary | ICD-10-CM | POA: Diagnosis not present

## 2018-03-22 DIAGNOSIS — D509 Iron deficiency anemia, unspecified: Secondary | ICD-10-CM | POA: Diagnosis not present

## 2018-03-22 DIAGNOSIS — N186 End stage renal disease: Secondary | ICD-10-CM | POA: Diagnosis not present

## 2018-03-22 DIAGNOSIS — D631 Anemia in chronic kidney disease: Secondary | ICD-10-CM | POA: Diagnosis not present

## 2018-03-22 DIAGNOSIS — N2581 Secondary hyperparathyroidism of renal origin: Secondary | ICD-10-CM | POA: Diagnosis not present

## 2018-03-23 DIAGNOSIS — G894 Chronic pain syndrome: Secondary | ICD-10-CM | POA: Diagnosis not present

## 2018-03-23 DIAGNOSIS — M5417 Radiculopathy, lumbosacral region: Secondary | ICD-10-CM | POA: Diagnosis not present

## 2018-03-23 DIAGNOSIS — N186 End stage renal disease: Secondary | ICD-10-CM | POA: Diagnosis not present

## 2018-03-23 DIAGNOSIS — M549 Dorsalgia, unspecified: Secondary | ICD-10-CM | POA: Diagnosis not present

## 2018-03-23 DIAGNOSIS — M533 Sacrococcygeal disorders, not elsewhere classified: Secondary | ICD-10-CM | POA: Diagnosis not present

## 2018-03-24 ENCOUNTER — Ambulatory Visit (INDEPENDENT_AMBULATORY_CARE_PROVIDER_SITE_OTHER): Payer: Self-pay | Admitting: Vascular Surgery

## 2018-03-24 ENCOUNTER — Encounter: Payer: Self-pay | Admitting: Vascular Surgery

## 2018-03-24 ENCOUNTER — Other Ambulatory Visit: Payer: Self-pay

## 2018-03-24 VITALS — BP 151/83 | HR 93 | Temp 99.7°F | Resp 18 | Ht 64.0 in | Wt 144.8 lb

## 2018-03-24 DIAGNOSIS — M79602 Pain in left arm: Secondary | ICD-10-CM

## 2018-03-24 DIAGNOSIS — I770 Arteriovenous fistula, acquired: Secondary | ICD-10-CM

## 2018-03-24 NOTE — Progress Notes (Signed)
  Subjective:     Patient ID: [name], female   DOB: January 20, 1971, 47 y.o.   MRN: [id number]  HPI 47 year old female recently underwent revision with Artegraft of her left upper arm AV fistula in the proximal segment.  She was then hospitalized with fever and swelling of her left arm was offered tunnel catheter but refused.  She now returns with persistent swelling and pain in the left upper arm as well as swelling of her bilateral lower extremities she is very frustrated and tearful on exam.  She states that she has been rejected for transplant and does not understand this.  She is having pain in the left arm but they continue to cannulate it as it is working well.  She is worried that she is having more fevers although she is not febrile on evaluation today.  Review of Systems Fever Shortness of breath with activity Left arm and leg swelling Wheezing Dizziness Major depression Issues with clotting    Objective:   Physical Exam She is awake and alert Left arm has edema throughout and palpable left radial pulse There is a strong thrill in the left upper arm AV fistula Incisions are healing well without erythema or drainage    Assessment/plan       47 year old female follows up after revision of her left upper arm AV fistula.  She is quite frustrated with her progress. I have again offered her a tunneled catheter but she continues to refuse based on the risk of infection.  At some point I think she will need a fistulogram but I would not perform this at this time given the ongoing swelling.  If she changes her mind and wants a catheter she can be scheduled without evaluation in the office again.  She demonstrates good understanding.  [name] C. Donzetta Matters, MD [facility] Office: [phone] Pager: [phone]

## 2018-03-27 DIAGNOSIS — N186 End stage renal disease: Secondary | ICD-10-CM | POA: Diagnosis not present

## 2018-03-27 DIAGNOSIS — D509 Iron deficiency anemia, unspecified: Secondary | ICD-10-CM | POA: Diagnosis not present

## 2018-03-27 DIAGNOSIS — N2581 Secondary hyperparathyroidism of renal origin: Secondary | ICD-10-CM | POA: Diagnosis not present

## 2018-03-27 DIAGNOSIS — D631 Anemia in chronic kidney disease: Secondary | ICD-10-CM | POA: Diagnosis not present

## 2018-03-28 DIAGNOSIS — Z992 Dependence on renal dialysis: Secondary | ICD-10-CM | POA: Diagnosis not present

## 2018-03-28 DIAGNOSIS — T82510D Breakdown (mechanical) of surgically created arteriovenous fistula, subsequent encounter: Secondary | ICD-10-CM | POA: Diagnosis not present

## 2018-03-28 DIAGNOSIS — I120 Hypertensive chronic kidney disease with stage 5 chronic kidney disease or end stage renal disease: Secondary | ICD-10-CM | POA: Diagnosis not present

## 2018-03-28 DIAGNOSIS — R011 Cardiac murmur, unspecified: Secondary | ICD-10-CM | POA: Diagnosis not present

## 2018-03-28 DIAGNOSIS — G894 Chronic pain syndrome: Secondary | ICD-10-CM | POA: Diagnosis not present

## 2018-03-28 DIAGNOSIS — N186 End stage renal disease: Secondary | ICD-10-CM | POA: Diagnosis not present

## 2018-03-30 DIAGNOSIS — I120 Hypertensive chronic kidney disease with stage 5 chronic kidney disease or end stage renal disease: Secondary | ICD-10-CM | POA: Diagnosis not present

## 2018-03-30 DIAGNOSIS — Z992 Dependence on renal dialysis: Secondary | ICD-10-CM | POA: Diagnosis not present

## 2018-03-30 DIAGNOSIS — G894 Chronic pain syndrome: Secondary | ICD-10-CM | POA: Diagnosis not present

## 2018-03-30 DIAGNOSIS — N186 End stage renal disease: Secondary | ICD-10-CM | POA: Diagnosis not present

## 2018-03-30 DIAGNOSIS — R011 Cardiac murmur, unspecified: Secondary | ICD-10-CM | POA: Diagnosis not present

## 2018-03-30 DIAGNOSIS — T82510D Breakdown (mechanical) of surgically created arteriovenous fistula, subsequent encounter: Secondary | ICD-10-CM | POA: Diagnosis not present

## 2018-04-01 DIAGNOSIS — N2581 Secondary hyperparathyroidism of renal origin: Secondary | ICD-10-CM | POA: Diagnosis not present

## 2018-04-01 DIAGNOSIS — Z992 Dependence on renal dialysis: Secondary | ICD-10-CM | POA: Diagnosis not present

## 2018-04-01 DIAGNOSIS — D696 Thrombocytopenia, unspecified: Secondary | ICD-10-CM | POA: Diagnosis not present

## 2018-04-01 DIAGNOSIS — N186 End stage renal disease: Secondary | ICD-10-CM | POA: Diagnosis not present

## 2018-04-01 DIAGNOSIS — D631 Anemia in chronic kidney disease: Secondary | ICD-10-CM | POA: Diagnosis not present

## 2018-04-01 DIAGNOSIS — N033 Chronic nephritic syndrome with diffuse mesangial proliferative glomerulonephritis: Secondary | ICD-10-CM | POA: Diagnosis not present

## 2018-04-03 DIAGNOSIS — N186 End stage renal disease: Secondary | ICD-10-CM | POA: Diagnosis not present

## 2018-04-03 DIAGNOSIS — N2581 Secondary hyperparathyroidism of renal origin: Secondary | ICD-10-CM | POA: Diagnosis not present

## 2018-04-03 DIAGNOSIS — D631 Anemia in chronic kidney disease: Secondary | ICD-10-CM | POA: Diagnosis not present

## 2018-04-03 DIAGNOSIS — D696 Thrombocytopenia, unspecified: Secondary | ICD-10-CM | POA: Diagnosis not present

## 2018-04-05 DIAGNOSIS — D696 Thrombocytopenia, unspecified: Secondary | ICD-10-CM | POA: Diagnosis not present

## 2018-04-05 DIAGNOSIS — N186 End stage renal disease: Secondary | ICD-10-CM | POA: Diagnosis not present

## 2018-04-05 DIAGNOSIS — D631 Anemia in chronic kidney disease: Secondary | ICD-10-CM | POA: Diagnosis not present

## 2018-04-05 DIAGNOSIS — N2581 Secondary hyperparathyroidism of renal origin: Secondary | ICD-10-CM | POA: Diagnosis not present

## 2018-04-07 DIAGNOSIS — D696 Thrombocytopenia, unspecified: Secondary | ICD-10-CM | POA: Diagnosis not present

## 2018-04-07 DIAGNOSIS — N2581 Secondary hyperparathyroidism of renal origin: Secondary | ICD-10-CM | POA: Diagnosis not present

## 2018-04-07 DIAGNOSIS — D631 Anemia in chronic kidney disease: Secondary | ICD-10-CM | POA: Diagnosis not present

## 2018-04-07 DIAGNOSIS — N186 End stage renal disease: Secondary | ICD-10-CM | POA: Diagnosis not present

## 2018-04-10 DIAGNOSIS — D696 Thrombocytopenia, unspecified: Secondary | ICD-10-CM | POA: Diagnosis not present

## 2018-04-10 DIAGNOSIS — N2581 Secondary hyperparathyroidism of renal origin: Secondary | ICD-10-CM | POA: Diagnosis not present

## 2018-04-10 DIAGNOSIS — D631 Anemia in chronic kidney disease: Secondary | ICD-10-CM | POA: Diagnosis not present

## 2018-04-10 DIAGNOSIS — N186 End stage renal disease: Secondary | ICD-10-CM | POA: Diagnosis not present

## 2018-04-12 DIAGNOSIS — D631 Anemia in chronic kidney disease: Secondary | ICD-10-CM | POA: Diagnosis not present

## 2018-04-12 DIAGNOSIS — N2581 Secondary hyperparathyroidism of renal origin: Secondary | ICD-10-CM | POA: Diagnosis not present

## 2018-04-12 DIAGNOSIS — N186 End stage renal disease: Secondary | ICD-10-CM | POA: Diagnosis not present

## 2018-04-12 DIAGNOSIS — D696 Thrombocytopenia, unspecified: Secondary | ICD-10-CM | POA: Diagnosis not present

## 2018-04-14 DIAGNOSIS — N186 End stage renal disease: Secondary | ICD-10-CM | POA: Diagnosis not present

## 2018-04-14 DIAGNOSIS — D696 Thrombocytopenia, unspecified: Secondary | ICD-10-CM | POA: Diagnosis not present

## 2018-04-14 DIAGNOSIS — D631 Anemia in chronic kidney disease: Secondary | ICD-10-CM | POA: Diagnosis not present

## 2018-04-14 DIAGNOSIS — N2581 Secondary hyperparathyroidism of renal origin: Secondary | ICD-10-CM | POA: Diagnosis not present

## 2018-04-17 DIAGNOSIS — N186 End stage renal disease: Secondary | ICD-10-CM | POA: Diagnosis not present

## 2018-04-17 DIAGNOSIS — D696 Thrombocytopenia, unspecified: Secondary | ICD-10-CM | POA: Diagnosis not present

## 2018-04-17 DIAGNOSIS — N2581 Secondary hyperparathyroidism of renal origin: Secondary | ICD-10-CM | POA: Diagnosis not present

## 2018-04-17 DIAGNOSIS — D631 Anemia in chronic kidney disease: Secondary | ICD-10-CM | POA: Diagnosis not present

## 2018-04-19 DIAGNOSIS — N2581 Secondary hyperparathyroidism of renal origin: Secondary | ICD-10-CM | POA: Diagnosis not present

## 2018-04-19 DIAGNOSIS — D696 Thrombocytopenia, unspecified: Secondary | ICD-10-CM | POA: Diagnosis not present

## 2018-04-19 DIAGNOSIS — N186 End stage renal disease: Secondary | ICD-10-CM | POA: Diagnosis not present

## 2018-04-19 DIAGNOSIS — D631 Anemia in chronic kidney disease: Secondary | ICD-10-CM | POA: Diagnosis not present

## 2018-04-21 DIAGNOSIS — D696 Thrombocytopenia, unspecified: Secondary | ICD-10-CM | POA: Diagnosis not present

## 2018-04-21 DIAGNOSIS — N2581 Secondary hyperparathyroidism of renal origin: Secondary | ICD-10-CM | POA: Diagnosis not present

## 2018-04-21 DIAGNOSIS — D631 Anemia in chronic kidney disease: Secondary | ICD-10-CM | POA: Diagnosis not present

## 2018-04-21 DIAGNOSIS — N186 End stage renal disease: Secondary | ICD-10-CM | POA: Diagnosis not present

## 2018-04-24 ENCOUNTER — Ambulatory Visit: Payer: Medicare Other

## 2018-04-24 DIAGNOSIS — N2581 Secondary hyperparathyroidism of renal origin: Secondary | ICD-10-CM | POA: Diagnosis not present

## 2018-04-24 DIAGNOSIS — D696 Thrombocytopenia, unspecified: Secondary | ICD-10-CM | POA: Diagnosis not present

## 2018-04-24 DIAGNOSIS — N186 End stage renal disease: Secondary | ICD-10-CM | POA: Diagnosis not present

## 2018-04-24 DIAGNOSIS — D631 Anemia in chronic kidney disease: Secondary | ICD-10-CM | POA: Diagnosis not present

## 2018-04-26 DIAGNOSIS — D631 Anemia in chronic kidney disease: Secondary | ICD-10-CM | POA: Diagnosis not present

## 2018-04-26 DIAGNOSIS — N186 End stage renal disease: Secondary | ICD-10-CM | POA: Diagnosis not present

## 2018-04-26 DIAGNOSIS — D696 Thrombocytopenia, unspecified: Secondary | ICD-10-CM | POA: Diagnosis not present

## 2018-04-26 DIAGNOSIS — N2581 Secondary hyperparathyroidism of renal origin: Secondary | ICD-10-CM | POA: Diagnosis not present

## 2018-04-27 ENCOUNTER — Encounter: Payer: Self-pay | Admitting: Physician Assistant

## 2018-04-27 ENCOUNTER — Other Ambulatory Visit: Payer: Self-pay

## 2018-04-27 ENCOUNTER — Ambulatory Visit (INDEPENDENT_AMBULATORY_CARE_PROVIDER_SITE_OTHER): Payer: Self-pay | Admitting: Physician Assistant

## 2018-04-27 VITALS — BP 152/85 | HR 80 | Temp 100.1°F | Resp 16 | Ht 64.0 in | Wt 145.0 lb

## 2018-04-27 DIAGNOSIS — G894 Chronic pain syndrome: Secondary | ICD-10-CM | POA: Diagnosis not present

## 2018-04-27 DIAGNOSIS — M7989 Other specified soft tissue disorders: Secondary | ICD-10-CM

## 2018-04-27 DIAGNOSIS — N186 End stage renal disease: Secondary | ICD-10-CM

## 2018-04-27 DIAGNOSIS — M533 Sacrococcygeal disorders, not elsewhere classified: Secondary | ICD-10-CM | POA: Diagnosis not present

## 2018-04-27 DIAGNOSIS — M5417 Radiculopathy, lumbosacral region: Secondary | ICD-10-CM | POA: Diagnosis not present

## 2018-04-27 DIAGNOSIS — Z992 Dependence on renal dialysis: Secondary | ICD-10-CM

## 2018-04-27 DIAGNOSIS — M549 Dorsalgia, unspecified: Secondary | ICD-10-CM | POA: Diagnosis not present

## 2018-04-27 DIAGNOSIS — M62830 Muscle spasm of back: Secondary | ICD-10-CM | POA: Diagnosis not present

## 2018-04-27 NOTE — Progress Notes (Signed)
Established Dialysis Access   History of Present Illness   [name] is a 47 y.o. (1971/10/31) female who presents for re-evaluation of permanent access.  She is status post left cephalic vein turndown and revision of left arm fistula by Dr. Scot Dock in March 2018.  She subsequently underwent revision of the proximal segment of her left AV fistula with Artegraft by Dr. Donzetta Matters on 03/02/2018.  She has experienced edema of her left arm since last revision.  Patient does admit edema has improved notably since last office visit.  Patient also still complaining of pain in the area of the Artegraft and has not let the dialysis unit use this area of her fistula.  She is emotional today because she is not happy with how her arm is feeling and upset about her health and need for dialysis.  She does not want to entertain the idea of having a new dialysis access however also does not want any more surgery or procedures on the current fistula of her left arm.  She does however state that they are able to cannulate the remaining segment of her fistula without difficulty.  She is dialyzing on a Monday Wednesday Friday schedule.  Patient is also having some edema of bilateral lower extremities however she plans to discuss this with her nephrologist.    Current Outpatient Medications  Medication Sig Dispense Refill  . acetaminophen (TYLENOL) 325 MG tablet Take 2 tablets (650 mg total) by mouth every 8 (eight) hours as needed for mild pain, fever or headache. 30 tablet 0  . albuterol (PROVENTIL HFA;VENTOLIN HFA) 108 (90 Base) MCG/ACT inhaler Inhale 2 puffs into the lungs every 6 (six) hours as needed for shortness of breath. 1 Inhaler 2  . ALPRAZolam (XANAX) 1 MG tablet Take 1 mg by mouth 3 (three) times daily.    Marland Kitchen amLODipine (NORVASC) 10 MG tablet Take 10 mg by mouth at bedtime.     . budesonide-formoterol (SYMBICORT) 160-4.5 MCG/ACT inhaler Inhale 2 puffs into the lungs 2 (two) times daily.    Marland Kitchen  buPROPion (WELLBUTRIN SR) 150 MG 12 hr tablet Take 150 mg by mouth daily.    . calcium acetate (PHOSLO) 667 MG capsule Take 2,001 mg by mouth 2 (two) times daily with a meal.    . cyclobenzaprine (FLEXERIL) 10 MG tablet Take 20 mg by mouth at bedtime.     Marland Kitchen HYDROmorphone (DILAUDID) 2 MG tablet Take 0.5 tablets (1 mg total) by mouth every 6 (six) hours as needed for moderate pain or severe pain. 14 tablet 0  . lidocaine-prilocaine (EMLA) cream Apply 1 application topically See admin instructions. Apply topically one hour prior to dialysis - Monday, Wednesday, Friday    . Melatonin 5 MG TABS Take 5 mg by mouth at bedtime.     . metoprolol tartrate (LOPRESSOR) 25 MG tablet Take 1 tablet (25 mg total) by mouth 2 (two) times daily. 60 tablet 0  . montelukast (SINGULAIR) 10 MG tablet Take 10 mg by mouth daily.     . multivitamin (RENA-VIT) TABS tablet Take 1 tablet by mouth daily.    . nicotine (NICODERM CQ - DOSED IN MG/24 HOURS) 14 mg/24hr patch Place 1 patch (14 mg total) onto the skin daily. 28 patch 0  . oxyCODONE ER (XTAMPZA ER) 36 MG C12A Take 36 mg by mouth 2 (two) times daily.    . sevelamer carbonate (RENVELA) 800 MG tablet Take 2,400 mg by mouth 2 (two) times daily with a  meal.     . SUMAtriptan (IMITREX) 50 MG tablet Take 50 mg by mouth daily as needed for migraine or headache.      No current facility-administered medications for this visit.     10 system ROS is negative unless otherwise noted in HPI   Physical Examination   Vitals:   04/27/18 1512  BP: (!) 152/85  Pulse: 80  Resp: 16  Temp: 100.1 F (37.8 C)  TempSrc: Oral  SpO2: 96%  Weight: 145 lb (65.8 kg)  Height: 5\' 4"  (1.626 m)   Body mass index is 24.89 kg/m.  General Alert, O x 3, WD, NAD  Pulmonary Sym exp, good B air movt,   Cardiac RRR, Nl S1, S2,   Vascular Vessel Right Left  Radial Palpable Palpable  Brachial Palpable Palpable  Ulnar Palpable Not palpable    Musculo- skeletal Generalized edema of L  forearm; palpable thrill and audible bruit throughout length of AV fistula; tenderness to touch in area of Artegraft; no palpable areas of fluctuance around artegraft M/S 5/5 throughout  , Extremities without ischemic changes    Neurologic A&O; CN grossly intact      Medical Decision Making   [name] is a 47 y.o. female who presents with persistent pain and edema LUE s/p revision of AV fistula with Artegraft 03/02/18   The patient has a patent left arm fistula with a palpable thrill and audible bruit throughout its length.  She however still complains of pain around the new Artegraft segment as well as generalized edema of her left arm.  Edema has notably improved since last office visit.  I discussed with the patient that the area of the Artegraft is now ready for use during dialysis however the patient would like to wait until the pain improves.  We also discussed patient's options going forward.  One option would be to continue using left arm fistula as well as new Artegraft segment.  Another option would be to perform a fistulogram to see if there is any outflow stenosis that could be contributing to some of the ongoing edema in her left arm.  This option has already been offered to the patient by Dr. Donzetta Matters pending improvement of left arm edema.  A third option would be to ligate patient's fistula given patient's persistent pain and frustration with cosmesis of aneurysmal disease, place a tunneled dialysis catheter, and pursue a new access in her right arm.  Patient states she is against this option at this point and I would agree that this would probably not be a reasonable choice at this time.  As for now she would like to allow more time for pain to subside in the area of the Artegraft and continue cannulation of the remaining segment of her fistula.  She would also like time to think about possibly performing a fistulogram.  Patient will call the office to schedule fistulogram if she so  chooses.  She will follow up on an as needed basis.  Dagoberto Ligas PA-C [facility] Office: [phone]

## 2018-04-28 DIAGNOSIS — D631 Anemia in chronic kidney disease: Secondary | ICD-10-CM | POA: Diagnosis not present

## 2018-04-28 DIAGNOSIS — D696 Thrombocytopenia, unspecified: Secondary | ICD-10-CM | POA: Diagnosis not present

## 2018-04-28 DIAGNOSIS — N186 End stage renal disease: Secondary | ICD-10-CM | POA: Diagnosis not present

## 2018-04-28 DIAGNOSIS — N2581 Secondary hyperparathyroidism of renal origin: Secondary | ICD-10-CM | POA: Diagnosis not present

## 2018-05-01 DIAGNOSIS — Z992 Dependence on renal dialysis: Secondary | ICD-10-CM | POA: Diagnosis not present

## 2018-05-01 DIAGNOSIS — N033 Chronic nephritic syndrome with diffuse mesangial proliferative glomerulonephritis: Secondary | ICD-10-CM | POA: Diagnosis not present

## 2018-05-01 DIAGNOSIS — N186 End stage renal disease: Secondary | ICD-10-CM | POA: Diagnosis not present

## 2018-05-01 DIAGNOSIS — N2581 Secondary hyperparathyroidism of renal origin: Secondary | ICD-10-CM | POA: Diagnosis not present

## 2018-05-01 DIAGNOSIS — D631 Anemia in chronic kidney disease: Secondary | ICD-10-CM | POA: Diagnosis not present

## 2018-05-01 DIAGNOSIS — D509 Iron deficiency anemia, unspecified: Secondary | ICD-10-CM | POA: Diagnosis not present

## 2018-05-03 DIAGNOSIS — D631 Anemia in chronic kidney disease: Secondary | ICD-10-CM | POA: Diagnosis not present

## 2018-05-03 DIAGNOSIS — N186 End stage renal disease: Secondary | ICD-10-CM | POA: Diagnosis not present

## 2018-05-03 DIAGNOSIS — N2581 Secondary hyperparathyroidism of renal origin: Secondary | ICD-10-CM | POA: Diagnosis not present

## 2018-05-03 DIAGNOSIS — D509 Iron deficiency anemia, unspecified: Secondary | ICD-10-CM | POA: Diagnosis not present

## 2018-05-05 DIAGNOSIS — D631 Anemia in chronic kidney disease: Secondary | ICD-10-CM | POA: Diagnosis not present

## 2018-05-05 DIAGNOSIS — N186 End stage renal disease: Secondary | ICD-10-CM | POA: Diagnosis not present

## 2018-05-05 DIAGNOSIS — N2581 Secondary hyperparathyroidism of renal origin: Secondary | ICD-10-CM | POA: Diagnosis not present

## 2018-05-05 DIAGNOSIS — D509 Iron deficiency anemia, unspecified: Secondary | ICD-10-CM | POA: Diagnosis not present

## 2018-05-09 DIAGNOSIS — N186 End stage renal disease: Secondary | ICD-10-CM | POA: Diagnosis not present

## 2018-05-09 DIAGNOSIS — N2581 Secondary hyperparathyroidism of renal origin: Secondary | ICD-10-CM | POA: Diagnosis not present

## 2018-05-09 DIAGNOSIS — D631 Anemia in chronic kidney disease: Secondary | ICD-10-CM | POA: Diagnosis not present

## 2018-05-09 DIAGNOSIS — D509 Iron deficiency anemia, unspecified: Secondary | ICD-10-CM | POA: Diagnosis not present

## 2018-05-10 DIAGNOSIS — D631 Anemia in chronic kidney disease: Secondary | ICD-10-CM | POA: Diagnosis not present

## 2018-05-10 DIAGNOSIS — D509 Iron deficiency anemia, unspecified: Secondary | ICD-10-CM | POA: Diagnosis not present

## 2018-05-10 DIAGNOSIS — N186 End stage renal disease: Secondary | ICD-10-CM | POA: Diagnosis not present

## 2018-05-10 DIAGNOSIS — N2581 Secondary hyperparathyroidism of renal origin: Secondary | ICD-10-CM | POA: Diagnosis not present

## 2018-05-12 DIAGNOSIS — D631 Anemia in chronic kidney disease: Secondary | ICD-10-CM | POA: Diagnosis not present

## 2018-05-12 DIAGNOSIS — D509 Iron deficiency anemia, unspecified: Secondary | ICD-10-CM | POA: Diagnosis not present

## 2018-05-12 DIAGNOSIS — N186 End stage renal disease: Secondary | ICD-10-CM | POA: Diagnosis not present

## 2018-05-12 DIAGNOSIS — N2581 Secondary hyperparathyroidism of renal origin: Secondary | ICD-10-CM | POA: Diagnosis not present

## 2018-05-15 DIAGNOSIS — D509 Iron deficiency anemia, unspecified: Secondary | ICD-10-CM | POA: Diagnosis not present

## 2018-05-15 DIAGNOSIS — N186 End stage renal disease: Secondary | ICD-10-CM | POA: Diagnosis not present

## 2018-05-15 DIAGNOSIS — N2581 Secondary hyperparathyroidism of renal origin: Secondary | ICD-10-CM | POA: Diagnosis not present

## 2018-05-15 DIAGNOSIS — D631 Anemia in chronic kidney disease: Secondary | ICD-10-CM | POA: Diagnosis not present

## 2018-05-17 DIAGNOSIS — N2581 Secondary hyperparathyroidism of renal origin: Secondary | ICD-10-CM | POA: Diagnosis not present

## 2018-05-17 DIAGNOSIS — D509 Iron deficiency anemia, unspecified: Secondary | ICD-10-CM | POA: Diagnosis not present

## 2018-05-17 DIAGNOSIS — N186 End stage renal disease: Secondary | ICD-10-CM | POA: Diagnosis not present

## 2018-05-17 DIAGNOSIS — D631 Anemia in chronic kidney disease: Secondary | ICD-10-CM | POA: Diagnosis not present

## 2018-05-19 DIAGNOSIS — N2581 Secondary hyperparathyroidism of renal origin: Secondary | ICD-10-CM | POA: Diagnosis not present

## 2018-05-19 DIAGNOSIS — N186 End stage renal disease: Secondary | ICD-10-CM | POA: Diagnosis not present

## 2018-05-19 DIAGNOSIS — D509 Iron deficiency anemia, unspecified: Secondary | ICD-10-CM | POA: Diagnosis not present

## 2018-05-19 DIAGNOSIS — D631 Anemia in chronic kidney disease: Secondary | ICD-10-CM | POA: Diagnosis not present

## 2018-05-22 DIAGNOSIS — D631 Anemia in chronic kidney disease: Secondary | ICD-10-CM | POA: Diagnosis not present

## 2018-05-22 DIAGNOSIS — D509 Iron deficiency anemia, unspecified: Secondary | ICD-10-CM | POA: Diagnosis not present

## 2018-05-22 DIAGNOSIS — N186 End stage renal disease: Secondary | ICD-10-CM | POA: Diagnosis not present

## 2018-05-22 DIAGNOSIS — N2581 Secondary hyperparathyroidism of renal origin: Secondary | ICD-10-CM | POA: Diagnosis not present

## 2018-05-24 DIAGNOSIS — N2581 Secondary hyperparathyroidism of renal origin: Secondary | ICD-10-CM | POA: Diagnosis not present

## 2018-05-24 DIAGNOSIS — N186 End stage renal disease: Secondary | ICD-10-CM | POA: Diagnosis not present

## 2018-05-24 DIAGNOSIS — E119 Type 2 diabetes mellitus without complications: Secondary | ICD-10-CM | POA: Diagnosis not present

## 2018-05-24 DIAGNOSIS — D631 Anemia in chronic kidney disease: Secondary | ICD-10-CM | POA: Diagnosis not present

## 2018-05-24 DIAGNOSIS — D509 Iron deficiency anemia, unspecified: Secondary | ICD-10-CM | POA: Diagnosis not present

## 2018-05-26 DIAGNOSIS — N2581 Secondary hyperparathyroidism of renal origin: Secondary | ICD-10-CM | POA: Diagnosis not present

## 2018-05-26 DIAGNOSIS — D509 Iron deficiency anemia, unspecified: Secondary | ICD-10-CM | POA: Diagnosis not present

## 2018-05-26 DIAGNOSIS — D631 Anemia in chronic kidney disease: Secondary | ICD-10-CM | POA: Diagnosis not present

## 2018-05-26 DIAGNOSIS — N186 End stage renal disease: Secondary | ICD-10-CM | POA: Diagnosis not present

## 2018-05-29 DIAGNOSIS — D631 Anemia in chronic kidney disease: Secondary | ICD-10-CM | POA: Diagnosis not present

## 2018-05-29 DIAGNOSIS — N2581 Secondary hyperparathyroidism of renal origin: Secondary | ICD-10-CM | POA: Diagnosis not present

## 2018-05-29 DIAGNOSIS — N186 End stage renal disease: Secondary | ICD-10-CM | POA: Diagnosis not present

## 2018-05-29 DIAGNOSIS — D509 Iron deficiency anemia, unspecified: Secondary | ICD-10-CM | POA: Diagnosis not present

## 2018-05-31 DIAGNOSIS — N2581 Secondary hyperparathyroidism of renal origin: Secondary | ICD-10-CM | POA: Diagnosis not present

## 2018-05-31 DIAGNOSIS — D631 Anemia in chronic kidney disease: Secondary | ICD-10-CM | POA: Diagnosis not present

## 2018-05-31 DIAGNOSIS — N186 End stage renal disease: Secondary | ICD-10-CM | POA: Diagnosis not present

## 2018-05-31 DIAGNOSIS — D509 Iron deficiency anemia, unspecified: Secondary | ICD-10-CM | POA: Diagnosis not present

## 2018-06-01 DIAGNOSIS — M533 Sacrococcygeal disorders, not elsewhere classified: Secondary | ICD-10-CM | POA: Diagnosis not present

## 2018-06-01 DIAGNOSIS — I120 Hypertensive chronic kidney disease with stage 5 chronic kidney disease or end stage renal disease: Secondary | ICD-10-CM | POA: Diagnosis not present

## 2018-06-01 DIAGNOSIS — D631 Anemia in chronic kidney disease: Secondary | ICD-10-CM | POA: Diagnosis not present

## 2018-06-01 DIAGNOSIS — Z87891 Personal history of nicotine dependence: Secondary | ICD-10-CM | POA: Diagnosis not present

## 2018-06-01 DIAGNOSIS — Z6841 Body Mass Index (BMI) 40.0 and over, adult: Secondary | ICD-10-CM | POA: Diagnosis not present

## 2018-06-01 DIAGNOSIS — G894 Chronic pain syndrome: Secondary | ICD-10-CM | POA: Diagnosis not present

## 2018-06-01 DIAGNOSIS — G4733 Obstructive sleep apnea (adult) (pediatric): Secondary | ICD-10-CM | POA: Diagnosis not present

## 2018-06-01 DIAGNOSIS — M1711 Unilateral primary osteoarthritis, right knee: Secondary | ICD-10-CM | POA: Diagnosis not present

## 2018-06-01 DIAGNOSIS — D509 Iron deficiency anemia, unspecified: Secondary | ICD-10-CM | POA: Diagnosis not present

## 2018-06-01 DIAGNOSIS — Z881 Allergy status to other antibiotic agents status: Secondary | ICD-10-CM | POA: Diagnosis not present

## 2018-06-01 DIAGNOSIS — N2581 Secondary hyperparathyroidism of renal origin: Secondary | ICD-10-CM | POA: Diagnosis not present

## 2018-06-01 DIAGNOSIS — F909 Attention-deficit hyperactivity disorder, unspecified type: Secondary | ICD-10-CM | POA: Diagnosis not present

## 2018-06-01 DIAGNOSIS — M545 Low back pain: Secondary | ICD-10-CM | POA: Diagnosis not present

## 2018-06-01 DIAGNOSIS — J45909 Unspecified asthma, uncomplicated: Secondary | ICD-10-CM | POA: Diagnosis not present

## 2018-06-01 DIAGNOSIS — Z79891 Long term (current) use of opiate analgesic: Secondary | ICD-10-CM | POA: Diagnosis not present

## 2018-06-01 DIAGNOSIS — N033 Chronic nephritic syndrome with diffuse mesangial proliferative glomerulonephritis: Secondary | ICD-10-CM | POA: Diagnosis not present

## 2018-06-01 DIAGNOSIS — F329 Major depressive disorder, single episode, unspecified: Secondary | ICD-10-CM | POA: Diagnosis not present

## 2018-06-01 DIAGNOSIS — F419 Anxiety disorder, unspecified: Secondary | ICD-10-CM | POA: Diagnosis not present

## 2018-06-01 DIAGNOSIS — Z992 Dependence on renal dialysis: Secondary | ICD-10-CM | POA: Diagnosis not present

## 2018-06-01 DIAGNOSIS — N186 End stage renal disease: Secondary | ICD-10-CM | POA: Diagnosis not present

## 2018-06-01 DIAGNOSIS — Z885 Allergy status to narcotic agent status: Secondary | ICD-10-CM | POA: Diagnosis not present

## 2018-06-01 DIAGNOSIS — Z886 Allergy status to analgesic agent status: Secondary | ICD-10-CM | POA: Diagnosis not present

## 2018-06-02 DIAGNOSIS — D509 Iron deficiency anemia, unspecified: Secondary | ICD-10-CM | POA: Diagnosis not present

## 2018-06-02 DIAGNOSIS — N2581 Secondary hyperparathyroidism of renal origin: Secondary | ICD-10-CM | POA: Diagnosis not present

## 2018-06-02 DIAGNOSIS — N186 End stage renal disease: Secondary | ICD-10-CM | POA: Diagnosis not present

## 2018-06-02 DIAGNOSIS — D631 Anemia in chronic kidney disease: Secondary | ICD-10-CM | POA: Diagnosis not present

## 2018-06-05 DIAGNOSIS — D631 Anemia in chronic kidney disease: Secondary | ICD-10-CM | POA: Diagnosis not present

## 2018-06-05 DIAGNOSIS — D509 Iron deficiency anemia, unspecified: Secondary | ICD-10-CM | POA: Diagnosis not present

## 2018-06-05 DIAGNOSIS — N186 End stage renal disease: Secondary | ICD-10-CM | POA: Diagnosis not present

## 2018-06-05 DIAGNOSIS — N2581 Secondary hyperparathyroidism of renal origin: Secondary | ICD-10-CM | POA: Diagnosis not present

## 2018-06-07 DIAGNOSIS — N2581 Secondary hyperparathyroidism of renal origin: Secondary | ICD-10-CM | POA: Diagnosis not present

## 2018-06-07 DIAGNOSIS — D509 Iron deficiency anemia, unspecified: Secondary | ICD-10-CM | POA: Diagnosis not present

## 2018-06-07 DIAGNOSIS — N186 End stage renal disease: Secondary | ICD-10-CM | POA: Diagnosis not present

## 2018-06-07 DIAGNOSIS — D631 Anemia in chronic kidney disease: Secondary | ICD-10-CM | POA: Diagnosis not present

## 2018-06-09 DIAGNOSIS — N186 End stage renal disease: Secondary | ICD-10-CM | POA: Diagnosis not present

## 2018-06-09 DIAGNOSIS — D631 Anemia in chronic kidney disease: Secondary | ICD-10-CM | POA: Diagnosis not present

## 2018-06-09 DIAGNOSIS — D509 Iron deficiency anemia, unspecified: Secondary | ICD-10-CM | POA: Diagnosis not present

## 2018-06-09 DIAGNOSIS — N2581 Secondary hyperparathyroidism of renal origin: Secondary | ICD-10-CM | POA: Diagnosis not present

## 2018-06-12 DIAGNOSIS — D509 Iron deficiency anemia, unspecified: Secondary | ICD-10-CM | POA: Diagnosis not present

## 2018-06-12 DIAGNOSIS — N186 End stage renal disease: Secondary | ICD-10-CM | POA: Diagnosis not present

## 2018-06-12 DIAGNOSIS — N2581 Secondary hyperparathyroidism of renal origin: Secondary | ICD-10-CM | POA: Diagnosis not present

## 2018-06-12 DIAGNOSIS — D631 Anemia in chronic kidney disease: Secondary | ICD-10-CM | POA: Diagnosis not present

## 2018-06-14 DIAGNOSIS — N2581 Secondary hyperparathyroidism of renal origin: Secondary | ICD-10-CM | POA: Diagnosis not present

## 2018-06-14 DIAGNOSIS — D631 Anemia in chronic kidney disease: Secondary | ICD-10-CM | POA: Diagnosis not present

## 2018-06-14 DIAGNOSIS — N186 End stage renal disease: Secondary | ICD-10-CM | POA: Diagnosis not present

## 2018-06-14 DIAGNOSIS — D509 Iron deficiency anemia, unspecified: Secondary | ICD-10-CM | POA: Diagnosis not present

## 2018-06-19 DIAGNOSIS — D509 Iron deficiency anemia, unspecified: Secondary | ICD-10-CM | POA: Diagnosis not present

## 2018-06-19 DIAGNOSIS — D631 Anemia in chronic kidney disease: Secondary | ICD-10-CM | POA: Diagnosis not present

## 2018-06-19 DIAGNOSIS — N186 End stage renal disease: Secondary | ICD-10-CM | POA: Diagnosis not present

## 2018-06-19 DIAGNOSIS — N2581 Secondary hyperparathyroidism of renal origin: Secondary | ICD-10-CM | POA: Diagnosis not present

## 2018-06-21 DIAGNOSIS — D631 Anemia in chronic kidney disease: Secondary | ICD-10-CM | POA: Diagnosis not present

## 2018-06-21 DIAGNOSIS — N2581 Secondary hyperparathyroidism of renal origin: Secondary | ICD-10-CM | POA: Diagnosis not present

## 2018-06-21 DIAGNOSIS — D509 Iron deficiency anemia, unspecified: Secondary | ICD-10-CM | POA: Diagnosis not present

## 2018-06-21 DIAGNOSIS — N186 End stage renal disease: Secondary | ICD-10-CM | POA: Diagnosis not present

## 2018-06-23 DIAGNOSIS — M533 Sacrococcygeal disorders, not elsewhere classified: Secondary | ICD-10-CM | POA: Diagnosis not present

## 2018-06-23 DIAGNOSIS — N2581 Secondary hyperparathyroidism of renal origin: Secondary | ICD-10-CM | POA: Diagnosis not present

## 2018-06-23 DIAGNOSIS — M5417 Radiculopathy, lumbosacral region: Secondary | ICD-10-CM | POA: Diagnosis not present

## 2018-06-23 DIAGNOSIS — G894 Chronic pain syndrome: Secondary | ICD-10-CM | POA: Diagnosis not present

## 2018-06-23 DIAGNOSIS — M549 Dorsalgia, unspecified: Secondary | ICD-10-CM | POA: Diagnosis not present

## 2018-06-23 DIAGNOSIS — D509 Iron deficiency anemia, unspecified: Secondary | ICD-10-CM | POA: Diagnosis not present

## 2018-06-23 DIAGNOSIS — D631 Anemia in chronic kidney disease: Secondary | ICD-10-CM | POA: Diagnosis not present

## 2018-06-23 DIAGNOSIS — N186 End stage renal disease: Secondary | ICD-10-CM | POA: Diagnosis not present

## 2018-06-26 DIAGNOSIS — N2581 Secondary hyperparathyroidism of renal origin: Secondary | ICD-10-CM | POA: Diagnosis not present

## 2018-06-26 DIAGNOSIS — N186 End stage renal disease: Secondary | ICD-10-CM | POA: Diagnosis not present

## 2018-06-26 DIAGNOSIS — D509 Iron deficiency anemia, unspecified: Secondary | ICD-10-CM | POA: Diagnosis not present

## 2018-06-26 DIAGNOSIS — D631 Anemia in chronic kidney disease: Secondary | ICD-10-CM | POA: Diagnosis not present

## 2018-07-01 DIAGNOSIS — D631 Anemia in chronic kidney disease: Secondary | ICD-10-CM | POA: Diagnosis not present

## 2018-07-01 DIAGNOSIS — N186 End stage renal disease: Secondary | ICD-10-CM | POA: Diagnosis not present

## 2018-07-01 DIAGNOSIS — N2581 Secondary hyperparathyroidism of renal origin: Secondary | ICD-10-CM | POA: Diagnosis not present

## 2018-07-01 DIAGNOSIS — D509 Iron deficiency anemia, unspecified: Secondary | ICD-10-CM | POA: Diagnosis not present

## 2018-07-02 DIAGNOSIS — N186 End stage renal disease: Secondary | ICD-10-CM | POA: Diagnosis not present

## 2018-07-02 DIAGNOSIS — Z992 Dependence on renal dialysis: Secondary | ICD-10-CM | POA: Diagnosis not present

## 2018-07-02 DIAGNOSIS — N033 Chronic nephritic syndrome with diffuse mesangial proliferative glomerulonephritis: Secondary | ICD-10-CM | POA: Diagnosis not present

## 2018-07-03 DIAGNOSIS — N186 End stage renal disease: Secondary | ICD-10-CM | POA: Diagnosis not present

## 2018-07-03 DIAGNOSIS — N2581 Secondary hyperparathyroidism of renal origin: Secondary | ICD-10-CM | POA: Diagnosis not present

## 2018-07-03 DIAGNOSIS — D631 Anemia in chronic kidney disease: Secondary | ICD-10-CM | POA: Diagnosis not present

## 2018-07-05 DIAGNOSIS — N2581 Secondary hyperparathyroidism of renal origin: Secondary | ICD-10-CM | POA: Diagnosis not present

## 2018-07-05 DIAGNOSIS — N186 End stage renal disease: Secondary | ICD-10-CM | POA: Diagnosis not present

## 2018-07-05 DIAGNOSIS — D631 Anemia in chronic kidney disease: Secondary | ICD-10-CM | POA: Diagnosis not present

## 2018-07-07 DIAGNOSIS — N2581 Secondary hyperparathyroidism of renal origin: Secondary | ICD-10-CM | POA: Diagnosis not present

## 2018-07-07 DIAGNOSIS — N186 End stage renal disease: Secondary | ICD-10-CM | POA: Diagnosis not present

## 2018-07-07 DIAGNOSIS — D631 Anemia in chronic kidney disease: Secondary | ICD-10-CM | POA: Diagnosis not present

## 2018-07-11 DIAGNOSIS — D631 Anemia in chronic kidney disease: Secondary | ICD-10-CM | POA: Diagnosis not present

## 2018-07-11 DIAGNOSIS — N186 End stage renal disease: Secondary | ICD-10-CM | POA: Diagnosis not present

## 2018-07-11 DIAGNOSIS — N2581 Secondary hyperparathyroidism of renal origin: Secondary | ICD-10-CM | POA: Diagnosis not present

## 2018-07-12 DIAGNOSIS — N2581 Secondary hyperparathyroidism of renal origin: Secondary | ICD-10-CM | POA: Diagnosis not present

## 2018-07-12 DIAGNOSIS — D631 Anemia in chronic kidney disease: Secondary | ICD-10-CM | POA: Diagnosis not present

## 2018-07-12 DIAGNOSIS — N186 End stage renal disease: Secondary | ICD-10-CM | POA: Diagnosis not present

## 2018-07-14 DIAGNOSIS — N2581 Secondary hyperparathyroidism of renal origin: Secondary | ICD-10-CM | POA: Diagnosis not present

## 2018-07-14 DIAGNOSIS — D631 Anemia in chronic kidney disease: Secondary | ICD-10-CM | POA: Diagnosis not present

## 2018-07-14 DIAGNOSIS — N186 End stage renal disease: Secondary | ICD-10-CM | POA: Diagnosis not present

## 2018-07-17 DIAGNOSIS — N186 End stage renal disease: Secondary | ICD-10-CM | POA: Diagnosis not present

## 2018-07-17 DIAGNOSIS — N2581 Secondary hyperparathyroidism of renal origin: Secondary | ICD-10-CM | POA: Diagnosis not present

## 2018-07-17 DIAGNOSIS — D631 Anemia in chronic kidney disease: Secondary | ICD-10-CM | POA: Diagnosis not present

## 2018-07-19 DIAGNOSIS — D631 Anemia in chronic kidney disease: Secondary | ICD-10-CM | POA: Diagnosis not present

## 2018-07-19 DIAGNOSIS — N2581 Secondary hyperparathyroidism of renal origin: Secondary | ICD-10-CM | POA: Diagnosis not present

## 2018-07-19 DIAGNOSIS — N186 End stage renal disease: Secondary | ICD-10-CM | POA: Diagnosis not present

## 2018-07-21 DIAGNOSIS — D631 Anemia in chronic kidney disease: Secondary | ICD-10-CM | POA: Diagnosis not present

## 2018-07-21 DIAGNOSIS — N2581 Secondary hyperparathyroidism of renal origin: Secondary | ICD-10-CM | POA: Diagnosis not present

## 2018-07-21 DIAGNOSIS — N186 End stage renal disease: Secondary | ICD-10-CM | POA: Diagnosis not present

## 2018-07-24 DIAGNOSIS — N186 End stage renal disease: Secondary | ICD-10-CM | POA: Diagnosis not present

## 2018-07-24 DIAGNOSIS — N2581 Secondary hyperparathyroidism of renal origin: Secondary | ICD-10-CM | POA: Diagnosis not present

## 2018-07-24 DIAGNOSIS — D631 Anemia in chronic kidney disease: Secondary | ICD-10-CM | POA: Diagnosis not present

## 2018-07-26 DIAGNOSIS — D631 Anemia in chronic kidney disease: Secondary | ICD-10-CM | POA: Diagnosis not present

## 2018-07-26 DIAGNOSIS — N186 End stage renal disease: Secondary | ICD-10-CM | POA: Diagnosis not present

## 2018-07-26 DIAGNOSIS — N2581 Secondary hyperparathyroidism of renal origin: Secondary | ICD-10-CM | POA: Diagnosis not present

## 2018-07-27 DIAGNOSIS — G894 Chronic pain syndrome: Secondary | ICD-10-CM | POA: Diagnosis not present

## 2018-07-27 DIAGNOSIS — M5417 Radiculopathy, lumbosacral region: Secondary | ICD-10-CM | POA: Diagnosis not present

## 2018-07-27 DIAGNOSIS — N186 End stage renal disease: Secondary | ICD-10-CM | POA: Diagnosis not present

## 2018-07-28 DIAGNOSIS — D631 Anemia in chronic kidney disease: Secondary | ICD-10-CM | POA: Diagnosis not present

## 2018-07-28 DIAGNOSIS — N186 End stage renal disease: Secondary | ICD-10-CM | POA: Diagnosis not present

## 2018-07-28 DIAGNOSIS — N2581 Secondary hyperparathyroidism of renal origin: Secondary | ICD-10-CM | POA: Diagnosis not present

## 2018-07-31 DIAGNOSIS — D631 Anemia in chronic kidney disease: Secondary | ICD-10-CM | POA: Diagnosis not present

## 2018-07-31 DIAGNOSIS — N2581 Secondary hyperparathyroidism of renal origin: Secondary | ICD-10-CM | POA: Diagnosis not present

## 2018-07-31 DIAGNOSIS — N186 End stage renal disease: Secondary | ICD-10-CM | POA: Diagnosis not present

## 2018-08-01 DIAGNOSIS — Z992 Dependence on renal dialysis: Secondary | ICD-10-CM | POA: Diagnosis not present

## 2018-08-01 DIAGNOSIS — N033 Chronic nephritic syndrome with diffuse mesangial proliferative glomerulonephritis: Secondary | ICD-10-CM | POA: Diagnosis not present

## 2018-08-01 DIAGNOSIS — N186 End stage renal disease: Secondary | ICD-10-CM | POA: Diagnosis not present

## 2018-08-02 DIAGNOSIS — D631 Anemia in chronic kidney disease: Secondary | ICD-10-CM | POA: Diagnosis not present

## 2018-08-02 DIAGNOSIS — N186 End stage renal disease: Secondary | ICD-10-CM | POA: Diagnosis not present

## 2018-08-02 DIAGNOSIS — Z23 Encounter for immunization: Secondary | ICD-10-CM | POA: Diagnosis not present

## 2018-08-02 DIAGNOSIS — E875 Hyperkalemia: Secondary | ICD-10-CM | POA: Diagnosis not present

## 2018-08-02 DIAGNOSIS — N2581 Secondary hyperparathyroidism of renal origin: Secondary | ICD-10-CM | POA: Diagnosis not present

## 2018-08-04 DIAGNOSIS — E875 Hyperkalemia: Secondary | ICD-10-CM | POA: Diagnosis not present

## 2018-08-04 DIAGNOSIS — D631 Anemia in chronic kidney disease: Secondary | ICD-10-CM | POA: Diagnosis not present

## 2018-08-04 DIAGNOSIS — N186 End stage renal disease: Secondary | ICD-10-CM | POA: Diagnosis not present

## 2018-08-04 DIAGNOSIS — Z23 Encounter for immunization: Secondary | ICD-10-CM | POA: Diagnosis not present

## 2018-08-04 DIAGNOSIS — N2581 Secondary hyperparathyroidism of renal origin: Secondary | ICD-10-CM | POA: Diagnosis not present

## 2018-08-09 DIAGNOSIS — N2581 Secondary hyperparathyroidism of renal origin: Secondary | ICD-10-CM | POA: Diagnosis not present

## 2018-08-09 DIAGNOSIS — Z23 Encounter for immunization: Secondary | ICD-10-CM | POA: Diagnosis not present

## 2018-08-09 DIAGNOSIS — D631 Anemia in chronic kidney disease: Secondary | ICD-10-CM | POA: Diagnosis not present

## 2018-08-09 DIAGNOSIS — N186 End stage renal disease: Secondary | ICD-10-CM | POA: Diagnosis not present

## 2018-08-09 DIAGNOSIS — E875 Hyperkalemia: Secondary | ICD-10-CM | POA: Diagnosis not present

## 2018-08-10 DIAGNOSIS — J209 Acute bronchitis, unspecified: Secondary | ICD-10-CM | POA: Diagnosis not present

## 2018-08-10 DIAGNOSIS — F418 Other specified anxiety disorders: Secondary | ICD-10-CM | POA: Diagnosis not present

## 2018-08-11 DIAGNOSIS — Z23 Encounter for immunization: Secondary | ICD-10-CM | POA: Diagnosis not present

## 2018-08-11 DIAGNOSIS — D631 Anemia in chronic kidney disease: Secondary | ICD-10-CM | POA: Diagnosis not present

## 2018-08-11 DIAGNOSIS — N2581 Secondary hyperparathyroidism of renal origin: Secondary | ICD-10-CM | POA: Diagnosis not present

## 2018-08-11 DIAGNOSIS — E875 Hyperkalemia: Secondary | ICD-10-CM | POA: Diagnosis not present

## 2018-08-11 DIAGNOSIS — N186 End stage renal disease: Secondary | ICD-10-CM | POA: Diagnosis not present

## 2018-08-14 DIAGNOSIS — E875 Hyperkalemia: Secondary | ICD-10-CM | POA: Diagnosis not present

## 2018-08-14 DIAGNOSIS — N2581 Secondary hyperparathyroidism of renal origin: Secondary | ICD-10-CM | POA: Diagnosis not present

## 2018-08-14 DIAGNOSIS — N186 End stage renal disease: Secondary | ICD-10-CM | POA: Diagnosis not present

## 2018-08-14 DIAGNOSIS — Z23 Encounter for immunization: Secondary | ICD-10-CM | POA: Diagnosis not present

## 2018-08-14 DIAGNOSIS — D631 Anemia in chronic kidney disease: Secondary | ICD-10-CM | POA: Diagnosis not present

## 2018-08-16 DIAGNOSIS — N186 End stage renal disease: Secondary | ICD-10-CM | POA: Diagnosis not present

## 2018-08-16 DIAGNOSIS — N2581 Secondary hyperparathyroidism of renal origin: Secondary | ICD-10-CM | POA: Diagnosis not present

## 2018-08-16 DIAGNOSIS — Z23 Encounter for immunization: Secondary | ICD-10-CM | POA: Diagnosis not present

## 2018-08-16 DIAGNOSIS — E875 Hyperkalemia: Secondary | ICD-10-CM | POA: Diagnosis not present

## 2018-08-16 DIAGNOSIS — D631 Anemia in chronic kidney disease: Secondary | ICD-10-CM | POA: Diagnosis not present

## 2018-08-18 DIAGNOSIS — E875 Hyperkalemia: Secondary | ICD-10-CM | POA: Diagnosis not present

## 2018-08-18 DIAGNOSIS — D631 Anemia in chronic kidney disease: Secondary | ICD-10-CM | POA: Diagnosis not present

## 2018-08-18 DIAGNOSIS — N2581 Secondary hyperparathyroidism of renal origin: Secondary | ICD-10-CM | POA: Diagnosis not present

## 2018-08-18 DIAGNOSIS — N186 End stage renal disease: Secondary | ICD-10-CM | POA: Diagnosis not present

## 2018-08-18 DIAGNOSIS — Z23 Encounter for immunization: Secondary | ICD-10-CM | POA: Diagnosis not present

## 2018-08-21 DIAGNOSIS — Z23 Encounter for immunization: Secondary | ICD-10-CM | POA: Diagnosis not present

## 2018-08-21 DIAGNOSIS — N186 End stage renal disease: Secondary | ICD-10-CM | POA: Diagnosis not present

## 2018-08-21 DIAGNOSIS — N2581 Secondary hyperparathyroidism of renal origin: Secondary | ICD-10-CM | POA: Diagnosis not present

## 2018-08-21 DIAGNOSIS — E875 Hyperkalemia: Secondary | ICD-10-CM | POA: Diagnosis not present

## 2018-08-21 DIAGNOSIS — D631 Anemia in chronic kidney disease: Secondary | ICD-10-CM | POA: Diagnosis not present

## 2018-08-25 DIAGNOSIS — N186 End stage renal disease: Secondary | ICD-10-CM | POA: Diagnosis not present

## 2018-08-25 DIAGNOSIS — Z23 Encounter for immunization: Secondary | ICD-10-CM | POA: Diagnosis not present

## 2018-08-25 DIAGNOSIS — E875 Hyperkalemia: Secondary | ICD-10-CM | POA: Diagnosis not present

## 2018-08-25 DIAGNOSIS — N2581 Secondary hyperparathyroidism of renal origin: Secondary | ICD-10-CM | POA: Diagnosis not present

## 2018-08-25 DIAGNOSIS — E119 Type 2 diabetes mellitus without complications: Secondary | ICD-10-CM | POA: Diagnosis not present

## 2018-08-25 DIAGNOSIS — D631 Anemia in chronic kidney disease: Secondary | ICD-10-CM | POA: Diagnosis not present

## 2018-08-28 DIAGNOSIS — N186 End stage renal disease: Secondary | ICD-10-CM | POA: Diagnosis not present

## 2018-08-28 DIAGNOSIS — N2581 Secondary hyperparathyroidism of renal origin: Secondary | ICD-10-CM | POA: Diagnosis not present

## 2018-08-28 DIAGNOSIS — Z23 Encounter for immunization: Secondary | ICD-10-CM | POA: Diagnosis not present

## 2018-08-28 DIAGNOSIS — E875 Hyperkalemia: Secondary | ICD-10-CM | POA: Diagnosis not present

## 2018-08-28 DIAGNOSIS — D631 Anemia in chronic kidney disease: Secondary | ICD-10-CM | POA: Diagnosis not present

## 2018-08-29 DIAGNOSIS — Z9889 Other specified postprocedural states: Secondary | ICD-10-CM | POA: Diagnosis not present

## 2018-08-29 DIAGNOSIS — M5126 Other intervertebral disc displacement, lumbar region: Secondary | ICD-10-CM | POA: Diagnosis not present

## 2018-08-29 DIAGNOSIS — M47816 Spondylosis without myelopathy or radiculopathy, lumbar region: Secondary | ICD-10-CM | POA: Diagnosis not present

## 2018-08-29 DIAGNOSIS — M4807 Spinal stenosis, lumbosacral region: Secondary | ICD-10-CM | POA: Diagnosis not present

## 2018-08-29 DIAGNOSIS — M5127 Other intervertebral disc displacement, lumbosacral region: Secondary | ICD-10-CM | POA: Diagnosis not present

## 2018-08-30 DIAGNOSIS — Z23 Encounter for immunization: Secondary | ICD-10-CM | POA: Diagnosis not present

## 2018-08-30 DIAGNOSIS — N2581 Secondary hyperparathyroidism of renal origin: Secondary | ICD-10-CM | POA: Diagnosis not present

## 2018-08-30 DIAGNOSIS — N186 End stage renal disease: Secondary | ICD-10-CM | POA: Diagnosis not present

## 2018-08-30 DIAGNOSIS — D631 Anemia in chronic kidney disease: Secondary | ICD-10-CM | POA: Diagnosis not present

## 2018-08-30 DIAGNOSIS — E875 Hyperkalemia: Secondary | ICD-10-CM | POA: Diagnosis not present

## 2018-08-31 DIAGNOSIS — M62830 Muscle spasm of back: Secondary | ICD-10-CM | POA: Diagnosis not present

## 2018-08-31 DIAGNOSIS — G894 Chronic pain syndrome: Secondary | ICD-10-CM | POA: Diagnosis not present

## 2018-08-31 DIAGNOSIS — N186 End stage renal disease: Secondary | ICD-10-CM | POA: Diagnosis not present

## 2018-08-31 DIAGNOSIS — M549 Dorsalgia, unspecified: Secondary | ICD-10-CM | POA: Diagnosis not present

## 2018-08-31 DIAGNOSIS — M5417 Radiculopathy, lumbosacral region: Secondary | ICD-10-CM | POA: Diagnosis not present

## 2018-08-31 DIAGNOSIS — M5136 Other intervertebral disc degeneration, lumbar region: Secondary | ICD-10-CM | POA: Diagnosis not present

## 2018-09-01 DIAGNOSIS — Z992 Dependence on renal dialysis: Secondary | ICD-10-CM | POA: Diagnosis not present

## 2018-09-01 DIAGNOSIS — N186 End stage renal disease: Secondary | ICD-10-CM | POA: Diagnosis not present

## 2018-09-01 DIAGNOSIS — D631 Anemia in chronic kidney disease: Secondary | ICD-10-CM | POA: Diagnosis not present

## 2018-09-01 DIAGNOSIS — N033 Chronic nephritic syndrome with diffuse mesangial proliferative glomerulonephritis: Secondary | ICD-10-CM | POA: Diagnosis not present

## 2018-09-01 DIAGNOSIS — N2581 Secondary hyperparathyroidism of renal origin: Secondary | ICD-10-CM | POA: Diagnosis not present

## 2018-09-04 DIAGNOSIS — N186 End stage renal disease: Secondary | ICD-10-CM | POA: Diagnosis not present

## 2018-09-04 DIAGNOSIS — N2581 Secondary hyperparathyroidism of renal origin: Secondary | ICD-10-CM | POA: Diagnosis not present

## 2018-09-04 DIAGNOSIS — D631 Anemia in chronic kidney disease: Secondary | ICD-10-CM | POA: Diagnosis not present

## 2018-09-06 DIAGNOSIS — D631 Anemia in chronic kidney disease: Secondary | ICD-10-CM | POA: Diagnosis not present

## 2018-09-06 DIAGNOSIS — N186 End stage renal disease: Secondary | ICD-10-CM | POA: Diagnosis not present

## 2018-09-06 DIAGNOSIS — N2581 Secondary hyperparathyroidism of renal origin: Secondary | ICD-10-CM | POA: Diagnosis not present

## 2018-09-08 DIAGNOSIS — N2581 Secondary hyperparathyroidism of renal origin: Secondary | ICD-10-CM | POA: Diagnosis not present

## 2018-09-08 DIAGNOSIS — N186 End stage renal disease: Secondary | ICD-10-CM | POA: Diagnosis not present

## 2018-09-08 DIAGNOSIS — D631 Anemia in chronic kidney disease: Secondary | ICD-10-CM | POA: Diagnosis not present

## 2018-09-11 DIAGNOSIS — N2581 Secondary hyperparathyroidism of renal origin: Secondary | ICD-10-CM | POA: Diagnosis not present

## 2018-09-11 DIAGNOSIS — N186 End stage renal disease: Secondary | ICD-10-CM | POA: Diagnosis not present

## 2018-09-11 DIAGNOSIS — D631 Anemia in chronic kidney disease: Secondary | ICD-10-CM | POA: Diagnosis not present

## 2018-09-13 DIAGNOSIS — N186 End stage renal disease: Secondary | ICD-10-CM | POA: Diagnosis not present

## 2018-09-13 DIAGNOSIS — D631 Anemia in chronic kidney disease: Secondary | ICD-10-CM | POA: Diagnosis not present

## 2018-09-13 DIAGNOSIS — N2581 Secondary hyperparathyroidism of renal origin: Secondary | ICD-10-CM | POA: Diagnosis not present

## 2018-09-15 DIAGNOSIS — N186 End stage renal disease: Secondary | ICD-10-CM | POA: Diagnosis not present

## 2018-09-15 DIAGNOSIS — D631 Anemia in chronic kidney disease: Secondary | ICD-10-CM | POA: Diagnosis not present

## 2018-09-15 DIAGNOSIS — N2581 Secondary hyperparathyroidism of renal origin: Secondary | ICD-10-CM | POA: Diagnosis not present

## 2018-09-18 DIAGNOSIS — N2581 Secondary hyperparathyroidism of renal origin: Secondary | ICD-10-CM | POA: Diagnosis not present

## 2018-09-18 DIAGNOSIS — N186 End stage renal disease: Secondary | ICD-10-CM | POA: Diagnosis not present

## 2018-09-18 DIAGNOSIS — D631 Anemia in chronic kidney disease: Secondary | ICD-10-CM | POA: Diagnosis not present

## 2018-09-19 DIAGNOSIS — R937 Abnormal findings on diagnostic imaging of other parts of musculoskeletal system: Secondary | ICD-10-CM | POA: Diagnosis not present

## 2018-09-19 DIAGNOSIS — M160 Bilateral primary osteoarthritis of hip: Secondary | ICD-10-CM | POA: Diagnosis not present

## 2018-09-19 DIAGNOSIS — M76891 Other specified enthesopathies of right lower limb, excluding foot: Secondary | ICD-10-CM | POA: Diagnosis not present

## 2018-09-19 DIAGNOSIS — M76892 Other specified enthesopathies of left lower limb, excluding foot: Secondary | ICD-10-CM | POA: Diagnosis not present

## 2018-09-20 DIAGNOSIS — D631 Anemia in chronic kidney disease: Secondary | ICD-10-CM | POA: Diagnosis not present

## 2018-09-20 DIAGNOSIS — N2581 Secondary hyperparathyroidism of renal origin: Secondary | ICD-10-CM | POA: Diagnosis not present

## 2018-09-20 DIAGNOSIS — N186 End stage renal disease: Secondary | ICD-10-CM | POA: Diagnosis not present

## 2018-09-25 DIAGNOSIS — N186 End stage renal disease: Secondary | ICD-10-CM | POA: Diagnosis not present

## 2018-09-25 DIAGNOSIS — D631 Anemia in chronic kidney disease: Secondary | ICD-10-CM | POA: Diagnosis not present

## 2018-09-25 DIAGNOSIS — N2581 Secondary hyperparathyroidism of renal origin: Secondary | ICD-10-CM | POA: Diagnosis not present

## 2018-09-26 ENCOUNTER — Ambulatory Visit: Payer: Medicare Other | Admitting: Family

## 2018-09-27 ENCOUNTER — Encounter: Payer: Self-pay | Admitting: Family

## 2018-09-27 DIAGNOSIS — N186 End stage renal disease: Secondary | ICD-10-CM | POA: Diagnosis not present

## 2018-09-27 DIAGNOSIS — D631 Anemia in chronic kidney disease: Secondary | ICD-10-CM | POA: Diagnosis not present

## 2018-09-27 DIAGNOSIS — N2581 Secondary hyperparathyroidism of renal origin: Secondary | ICD-10-CM | POA: Diagnosis not present

## 2018-09-29 DIAGNOSIS — N2581 Secondary hyperparathyroidism of renal origin: Secondary | ICD-10-CM | POA: Diagnosis not present

## 2018-09-29 DIAGNOSIS — N186 End stage renal disease: Secondary | ICD-10-CM | POA: Diagnosis not present

## 2018-09-29 DIAGNOSIS — D631 Anemia in chronic kidney disease: Secondary | ICD-10-CM | POA: Diagnosis not present

## 2018-10-01 DIAGNOSIS — N186 End stage renal disease: Secondary | ICD-10-CM | POA: Diagnosis not present

## 2018-10-01 DIAGNOSIS — Z992 Dependence on renal dialysis: Secondary | ICD-10-CM | POA: Diagnosis not present

## 2018-10-01 DIAGNOSIS — N033 Chronic nephritic syndrome with diffuse mesangial proliferative glomerulonephritis: Secondary | ICD-10-CM | POA: Diagnosis not present

## 2018-10-02 DIAGNOSIS — D631 Anemia in chronic kidney disease: Secondary | ICD-10-CM | POA: Diagnosis not present

## 2018-10-02 DIAGNOSIS — D509 Iron deficiency anemia, unspecified: Secondary | ICD-10-CM | POA: Diagnosis not present

## 2018-10-02 DIAGNOSIS — N2581 Secondary hyperparathyroidism of renal origin: Secondary | ICD-10-CM | POA: Diagnosis not present

## 2018-10-02 DIAGNOSIS — N186 End stage renal disease: Secondary | ICD-10-CM | POA: Diagnosis not present

## 2018-10-06 DIAGNOSIS — D631 Anemia in chronic kidney disease: Secondary | ICD-10-CM | POA: Diagnosis not present

## 2018-10-06 DIAGNOSIS — D509 Iron deficiency anemia, unspecified: Secondary | ICD-10-CM | POA: Diagnosis not present

## 2018-10-06 DIAGNOSIS — N186 End stage renal disease: Secondary | ICD-10-CM | POA: Diagnosis not present

## 2018-10-06 DIAGNOSIS — N2581 Secondary hyperparathyroidism of renal origin: Secondary | ICD-10-CM | POA: Diagnosis not present

## 2018-10-09 DIAGNOSIS — D509 Iron deficiency anemia, unspecified: Secondary | ICD-10-CM | POA: Diagnosis not present

## 2018-10-09 DIAGNOSIS — N2581 Secondary hyperparathyroidism of renal origin: Secondary | ICD-10-CM | POA: Diagnosis not present

## 2018-10-09 DIAGNOSIS — D631 Anemia in chronic kidney disease: Secondary | ICD-10-CM | POA: Diagnosis not present

## 2018-10-09 DIAGNOSIS — N186 End stage renal disease: Secondary | ICD-10-CM | POA: Diagnosis not present

## 2018-10-11 DIAGNOSIS — N2581 Secondary hyperparathyroidism of renal origin: Secondary | ICD-10-CM | POA: Diagnosis not present

## 2018-10-11 DIAGNOSIS — N186 End stage renal disease: Secondary | ICD-10-CM | POA: Diagnosis not present

## 2018-10-11 DIAGNOSIS — D509 Iron deficiency anemia, unspecified: Secondary | ICD-10-CM | POA: Diagnosis not present

## 2018-10-11 DIAGNOSIS — D631 Anemia in chronic kidney disease: Secondary | ICD-10-CM | POA: Diagnosis not present

## 2018-10-13 DIAGNOSIS — D631 Anemia in chronic kidney disease: Secondary | ICD-10-CM | POA: Diagnosis not present

## 2018-10-13 DIAGNOSIS — N186 End stage renal disease: Secondary | ICD-10-CM | POA: Diagnosis not present

## 2018-10-13 DIAGNOSIS — N2581 Secondary hyperparathyroidism of renal origin: Secondary | ICD-10-CM | POA: Diagnosis not present

## 2018-10-13 DIAGNOSIS — D509 Iron deficiency anemia, unspecified: Secondary | ICD-10-CM | POA: Diagnosis not present

## 2018-10-16 DIAGNOSIS — N2581 Secondary hyperparathyroidism of renal origin: Secondary | ICD-10-CM | POA: Diagnosis not present

## 2018-10-16 DIAGNOSIS — N186 End stage renal disease: Secondary | ICD-10-CM | POA: Diagnosis not present

## 2018-10-16 DIAGNOSIS — D509 Iron deficiency anemia, unspecified: Secondary | ICD-10-CM | POA: Diagnosis not present

## 2018-10-16 DIAGNOSIS — D631 Anemia in chronic kidney disease: Secondary | ICD-10-CM | POA: Diagnosis not present

## 2018-10-18 DIAGNOSIS — N186 End stage renal disease: Secondary | ICD-10-CM | POA: Diagnosis not present

## 2018-10-18 DIAGNOSIS — D509 Iron deficiency anemia, unspecified: Secondary | ICD-10-CM | POA: Diagnosis not present

## 2018-10-18 DIAGNOSIS — N2581 Secondary hyperparathyroidism of renal origin: Secondary | ICD-10-CM | POA: Diagnosis not present

## 2018-10-18 DIAGNOSIS — D631 Anemia in chronic kidney disease: Secondary | ICD-10-CM | POA: Diagnosis not present

## 2018-10-22 DIAGNOSIS — N2581 Secondary hyperparathyroidism of renal origin: Secondary | ICD-10-CM | POA: Diagnosis not present

## 2018-10-22 DIAGNOSIS — N186 End stage renal disease: Secondary | ICD-10-CM | POA: Diagnosis not present

## 2018-10-22 DIAGNOSIS — D631 Anemia in chronic kidney disease: Secondary | ICD-10-CM | POA: Diagnosis not present

## 2018-10-22 DIAGNOSIS — D509 Iron deficiency anemia, unspecified: Secondary | ICD-10-CM | POA: Diagnosis not present

## 2018-10-24 DIAGNOSIS — D509 Iron deficiency anemia, unspecified: Secondary | ICD-10-CM | POA: Diagnosis not present

## 2018-10-24 DIAGNOSIS — N186 End stage renal disease: Secondary | ICD-10-CM | POA: Diagnosis not present

## 2018-10-24 DIAGNOSIS — N2581 Secondary hyperparathyroidism of renal origin: Secondary | ICD-10-CM | POA: Diagnosis not present

## 2018-10-24 DIAGNOSIS — D631 Anemia in chronic kidney disease: Secondary | ICD-10-CM | POA: Diagnosis not present

## 2018-10-26 DIAGNOSIS — Z79899 Other long term (current) drug therapy: Secondary | ICD-10-CM | POA: Diagnosis not present

## 2018-10-26 DIAGNOSIS — Z5181 Encounter for therapeutic drug level monitoring: Secondary | ICD-10-CM | POA: Diagnosis not present

## 2018-10-26 DIAGNOSIS — M5417 Radiculopathy, lumbosacral region: Secondary | ICD-10-CM | POA: Diagnosis not present

## 2018-10-26 DIAGNOSIS — G894 Chronic pain syndrome: Secondary | ICD-10-CM | POA: Diagnosis not present

## 2018-10-26 DIAGNOSIS — N186 End stage renal disease: Secondary | ICD-10-CM | POA: Diagnosis not present

## 2018-10-27 DIAGNOSIS — D509 Iron deficiency anemia, unspecified: Secondary | ICD-10-CM | POA: Diagnosis not present

## 2018-10-27 DIAGNOSIS — N2581 Secondary hyperparathyroidism of renal origin: Secondary | ICD-10-CM | POA: Diagnosis not present

## 2018-10-27 DIAGNOSIS — D631 Anemia in chronic kidney disease: Secondary | ICD-10-CM | POA: Diagnosis not present

## 2018-10-27 DIAGNOSIS — N186 End stage renal disease: Secondary | ICD-10-CM | POA: Diagnosis not present

## 2018-10-29 DIAGNOSIS — N186 End stage renal disease: Secondary | ICD-10-CM | POA: Diagnosis not present

## 2018-10-29 DIAGNOSIS — D509 Iron deficiency anemia, unspecified: Secondary | ICD-10-CM | POA: Diagnosis not present

## 2018-10-29 DIAGNOSIS — N2581 Secondary hyperparathyroidism of renal origin: Secondary | ICD-10-CM | POA: Diagnosis not present

## 2018-10-29 DIAGNOSIS — D631 Anemia in chronic kidney disease: Secondary | ICD-10-CM | POA: Diagnosis not present

## 2018-10-31 DIAGNOSIS — N186 End stage renal disease: Secondary | ICD-10-CM | POA: Diagnosis not present

## 2018-10-31 DIAGNOSIS — D631 Anemia in chronic kidney disease: Secondary | ICD-10-CM | POA: Diagnosis not present

## 2018-10-31 DIAGNOSIS — N2581 Secondary hyperparathyroidism of renal origin: Secondary | ICD-10-CM | POA: Diagnosis not present

## 2018-10-31 DIAGNOSIS — D509 Iron deficiency anemia, unspecified: Secondary | ICD-10-CM | POA: Diagnosis not present

## 2018-11-01 DIAGNOSIS — N033 Chronic nephritic syndrome with diffuse mesangial proliferative glomerulonephritis: Secondary | ICD-10-CM | POA: Diagnosis not present

## 2018-11-01 DIAGNOSIS — Z992 Dependence on renal dialysis: Secondary | ICD-10-CM | POA: Diagnosis not present

## 2018-11-01 DIAGNOSIS — N186 End stage renal disease: Secondary | ICD-10-CM | POA: Diagnosis not present

## 2018-11-03 DIAGNOSIS — N2581 Secondary hyperparathyroidism of renal origin: Secondary | ICD-10-CM | POA: Diagnosis not present

## 2018-11-03 DIAGNOSIS — D631 Anemia in chronic kidney disease: Secondary | ICD-10-CM | POA: Diagnosis not present

## 2018-11-03 DIAGNOSIS — D509 Iron deficiency anemia, unspecified: Secondary | ICD-10-CM | POA: Diagnosis not present

## 2018-11-03 DIAGNOSIS — E119 Type 2 diabetes mellitus without complications: Secondary | ICD-10-CM | POA: Diagnosis not present

## 2018-11-03 DIAGNOSIS — N186 End stage renal disease: Secondary | ICD-10-CM | POA: Diagnosis not present

## 2018-11-07 DIAGNOSIS — D509 Iron deficiency anemia, unspecified: Secondary | ICD-10-CM | POA: Diagnosis not present

## 2018-11-07 DIAGNOSIS — N2581 Secondary hyperparathyroidism of renal origin: Secondary | ICD-10-CM | POA: Diagnosis not present

## 2018-11-07 DIAGNOSIS — D631 Anemia in chronic kidney disease: Secondary | ICD-10-CM | POA: Diagnosis not present

## 2018-11-07 DIAGNOSIS — E119 Type 2 diabetes mellitus without complications: Secondary | ICD-10-CM | POA: Diagnosis not present

## 2018-11-07 DIAGNOSIS — N186 End stage renal disease: Secondary | ICD-10-CM | POA: Diagnosis not present

## 2018-11-08 DIAGNOSIS — D509 Iron deficiency anemia, unspecified: Secondary | ICD-10-CM | POA: Diagnosis not present

## 2018-11-08 DIAGNOSIS — N186 End stage renal disease: Secondary | ICD-10-CM | POA: Diagnosis not present

## 2018-11-08 DIAGNOSIS — N2581 Secondary hyperparathyroidism of renal origin: Secondary | ICD-10-CM | POA: Diagnosis not present

## 2018-11-08 DIAGNOSIS — E119 Type 2 diabetes mellitus without complications: Secondary | ICD-10-CM | POA: Diagnosis not present

## 2018-11-08 DIAGNOSIS — D631 Anemia in chronic kidney disease: Secondary | ICD-10-CM | POA: Diagnosis not present

## 2018-11-10 DIAGNOSIS — E119 Type 2 diabetes mellitus without complications: Secondary | ICD-10-CM | POA: Diagnosis not present

## 2018-11-10 DIAGNOSIS — D509 Iron deficiency anemia, unspecified: Secondary | ICD-10-CM | POA: Diagnosis not present

## 2018-11-10 DIAGNOSIS — D631 Anemia in chronic kidney disease: Secondary | ICD-10-CM | POA: Diagnosis not present

## 2018-11-10 DIAGNOSIS — N186 End stage renal disease: Secondary | ICD-10-CM | POA: Diagnosis not present

## 2018-11-10 DIAGNOSIS — N2581 Secondary hyperparathyroidism of renal origin: Secondary | ICD-10-CM | POA: Diagnosis not present

## 2018-11-13 DIAGNOSIS — D509 Iron deficiency anemia, unspecified: Secondary | ICD-10-CM | POA: Diagnosis not present

## 2018-11-13 DIAGNOSIS — D631 Anemia in chronic kidney disease: Secondary | ICD-10-CM | POA: Diagnosis not present

## 2018-11-13 DIAGNOSIS — N2581 Secondary hyperparathyroidism of renal origin: Secondary | ICD-10-CM | POA: Diagnosis not present

## 2018-11-13 DIAGNOSIS — N186 End stage renal disease: Secondary | ICD-10-CM | POA: Diagnosis not present

## 2018-11-13 DIAGNOSIS — E119 Type 2 diabetes mellitus without complications: Secondary | ICD-10-CM | POA: Diagnosis not present

## 2018-11-15 DIAGNOSIS — D631 Anemia in chronic kidney disease: Secondary | ICD-10-CM | POA: Diagnosis not present

## 2018-11-15 DIAGNOSIS — N186 End stage renal disease: Secondary | ICD-10-CM | POA: Diagnosis not present

## 2018-11-15 DIAGNOSIS — N2581 Secondary hyperparathyroidism of renal origin: Secondary | ICD-10-CM | POA: Diagnosis not present

## 2018-11-15 DIAGNOSIS — E119 Type 2 diabetes mellitus without complications: Secondary | ICD-10-CM | POA: Diagnosis not present

## 2018-11-15 DIAGNOSIS — D509 Iron deficiency anemia, unspecified: Secondary | ICD-10-CM | POA: Diagnosis not present

## 2018-11-17 DIAGNOSIS — N186 End stage renal disease: Secondary | ICD-10-CM | POA: Diagnosis not present

## 2018-11-17 DIAGNOSIS — E119 Type 2 diabetes mellitus without complications: Secondary | ICD-10-CM | POA: Diagnosis not present

## 2018-11-17 DIAGNOSIS — N2581 Secondary hyperparathyroidism of renal origin: Secondary | ICD-10-CM | POA: Diagnosis not present

## 2018-11-17 DIAGNOSIS — D509 Iron deficiency anemia, unspecified: Secondary | ICD-10-CM | POA: Diagnosis not present

## 2018-11-17 DIAGNOSIS — D631 Anemia in chronic kidney disease: Secondary | ICD-10-CM | POA: Diagnosis not present

## 2018-11-20 DIAGNOSIS — N186 End stage renal disease: Secondary | ICD-10-CM | POA: Diagnosis not present

## 2018-11-20 DIAGNOSIS — D509 Iron deficiency anemia, unspecified: Secondary | ICD-10-CM | POA: Diagnosis not present

## 2018-11-20 DIAGNOSIS — N2581 Secondary hyperparathyroidism of renal origin: Secondary | ICD-10-CM | POA: Diagnosis not present

## 2018-11-20 DIAGNOSIS — D631 Anemia in chronic kidney disease: Secondary | ICD-10-CM | POA: Diagnosis not present

## 2018-11-20 DIAGNOSIS — E119 Type 2 diabetes mellitus without complications: Secondary | ICD-10-CM | POA: Diagnosis not present

## 2018-11-22 DIAGNOSIS — D631 Anemia in chronic kidney disease: Secondary | ICD-10-CM | POA: Diagnosis not present

## 2018-11-22 DIAGNOSIS — E119 Type 2 diabetes mellitus without complications: Secondary | ICD-10-CM | POA: Diagnosis not present

## 2018-11-22 DIAGNOSIS — N2581 Secondary hyperparathyroidism of renal origin: Secondary | ICD-10-CM | POA: Diagnosis not present

## 2018-11-22 DIAGNOSIS — N186 End stage renal disease: Secondary | ICD-10-CM | POA: Diagnosis not present

## 2018-11-22 DIAGNOSIS — D509 Iron deficiency anemia, unspecified: Secondary | ICD-10-CM | POA: Diagnosis not present

## 2018-11-24 DIAGNOSIS — N186 End stage renal disease: Secondary | ICD-10-CM | POA: Diagnosis not present

## 2018-11-24 DIAGNOSIS — E119 Type 2 diabetes mellitus without complications: Secondary | ICD-10-CM | POA: Diagnosis not present

## 2018-11-24 DIAGNOSIS — D631 Anemia in chronic kidney disease: Secondary | ICD-10-CM | POA: Diagnosis not present

## 2018-11-24 DIAGNOSIS — D509 Iron deficiency anemia, unspecified: Secondary | ICD-10-CM | POA: Diagnosis not present

## 2018-11-24 DIAGNOSIS — N2581 Secondary hyperparathyroidism of renal origin: Secondary | ICD-10-CM | POA: Diagnosis not present

## 2018-11-27 DIAGNOSIS — D631 Anemia in chronic kidney disease: Secondary | ICD-10-CM | POA: Diagnosis not present

## 2018-11-27 DIAGNOSIS — E119 Type 2 diabetes mellitus without complications: Secondary | ICD-10-CM | POA: Diagnosis not present

## 2018-11-27 DIAGNOSIS — D509 Iron deficiency anemia, unspecified: Secondary | ICD-10-CM | POA: Diagnosis not present

## 2018-11-27 DIAGNOSIS — N2581 Secondary hyperparathyroidism of renal origin: Secondary | ICD-10-CM | POA: Diagnosis not present

## 2018-11-27 DIAGNOSIS — N186 End stage renal disease: Secondary | ICD-10-CM | POA: Diagnosis not present

## 2018-11-29 DIAGNOSIS — D509 Iron deficiency anemia, unspecified: Secondary | ICD-10-CM | POA: Diagnosis not present

## 2018-11-29 DIAGNOSIS — N2581 Secondary hyperparathyroidism of renal origin: Secondary | ICD-10-CM | POA: Diagnosis not present

## 2018-11-29 DIAGNOSIS — N186 End stage renal disease: Secondary | ICD-10-CM | POA: Diagnosis not present

## 2018-11-29 DIAGNOSIS — E119 Type 2 diabetes mellitus without complications: Secondary | ICD-10-CM | POA: Diagnosis not present

## 2018-11-29 DIAGNOSIS — D631 Anemia in chronic kidney disease: Secondary | ICD-10-CM | POA: Diagnosis not present

## 2018-12-01 DIAGNOSIS — N2581 Secondary hyperparathyroidism of renal origin: Secondary | ICD-10-CM | POA: Diagnosis not present

## 2018-12-01 DIAGNOSIS — E119 Type 2 diabetes mellitus without complications: Secondary | ICD-10-CM | POA: Diagnosis not present

## 2018-12-01 DIAGNOSIS — D509 Iron deficiency anemia, unspecified: Secondary | ICD-10-CM | POA: Diagnosis not present

## 2018-12-01 DIAGNOSIS — D631 Anemia in chronic kidney disease: Secondary | ICD-10-CM | POA: Diagnosis not present

## 2018-12-01 DIAGNOSIS — N186 End stage renal disease: Secondary | ICD-10-CM | POA: Diagnosis not present

## 2018-12-02 DIAGNOSIS — N033 Chronic nephritic syndrome with diffuse mesangial proliferative glomerulonephritis: Secondary | ICD-10-CM | POA: Diagnosis not present

## 2018-12-02 DIAGNOSIS — Z992 Dependence on renal dialysis: Secondary | ICD-10-CM | POA: Diagnosis not present

## 2018-12-02 DIAGNOSIS — N186 End stage renal disease: Secondary | ICD-10-CM | POA: Diagnosis not present

## 2018-12-04 DIAGNOSIS — E119 Type 2 diabetes mellitus without complications: Secondary | ICD-10-CM | POA: Diagnosis not present

## 2018-12-04 DIAGNOSIS — N2581 Secondary hyperparathyroidism of renal origin: Secondary | ICD-10-CM | POA: Diagnosis not present

## 2018-12-04 DIAGNOSIS — D509 Iron deficiency anemia, unspecified: Secondary | ICD-10-CM | POA: Diagnosis not present

## 2018-12-04 DIAGNOSIS — N186 End stage renal disease: Secondary | ICD-10-CM | POA: Diagnosis not present

## 2018-12-04 DIAGNOSIS — D631 Anemia in chronic kidney disease: Secondary | ICD-10-CM | POA: Diagnosis not present

## 2018-12-06 DIAGNOSIS — D631 Anemia in chronic kidney disease: Secondary | ICD-10-CM | POA: Diagnosis not present

## 2018-12-06 DIAGNOSIS — N186 End stage renal disease: Secondary | ICD-10-CM | POA: Diagnosis not present

## 2018-12-06 DIAGNOSIS — D509 Iron deficiency anemia, unspecified: Secondary | ICD-10-CM | POA: Diagnosis not present

## 2018-12-06 DIAGNOSIS — E119 Type 2 diabetes mellitus without complications: Secondary | ICD-10-CM | POA: Diagnosis not present

## 2018-12-06 DIAGNOSIS — N2581 Secondary hyperparathyroidism of renal origin: Secondary | ICD-10-CM | POA: Diagnosis not present

## 2018-12-08 DIAGNOSIS — D509 Iron deficiency anemia, unspecified: Secondary | ICD-10-CM | POA: Diagnosis not present

## 2018-12-08 DIAGNOSIS — N186 End stage renal disease: Secondary | ICD-10-CM | POA: Diagnosis not present

## 2018-12-08 DIAGNOSIS — E119 Type 2 diabetes mellitus without complications: Secondary | ICD-10-CM | POA: Diagnosis not present

## 2018-12-08 DIAGNOSIS — D631 Anemia in chronic kidney disease: Secondary | ICD-10-CM | POA: Diagnosis not present

## 2018-12-08 DIAGNOSIS — N2581 Secondary hyperparathyroidism of renal origin: Secondary | ICD-10-CM | POA: Diagnosis not present

## 2018-12-11 DIAGNOSIS — N2581 Secondary hyperparathyroidism of renal origin: Secondary | ICD-10-CM | POA: Diagnosis not present

## 2018-12-11 DIAGNOSIS — E119 Type 2 diabetes mellitus without complications: Secondary | ICD-10-CM | POA: Diagnosis not present

## 2018-12-11 DIAGNOSIS — D509 Iron deficiency anemia, unspecified: Secondary | ICD-10-CM | POA: Diagnosis not present

## 2018-12-11 DIAGNOSIS — D631 Anemia in chronic kidney disease: Secondary | ICD-10-CM | POA: Diagnosis not present

## 2018-12-11 DIAGNOSIS — N186 End stage renal disease: Secondary | ICD-10-CM | POA: Diagnosis not present

## 2018-12-13 DIAGNOSIS — E119 Type 2 diabetes mellitus without complications: Secondary | ICD-10-CM | POA: Diagnosis not present

## 2018-12-13 DIAGNOSIS — N2581 Secondary hyperparathyroidism of renal origin: Secondary | ICD-10-CM | POA: Diagnosis not present

## 2018-12-13 DIAGNOSIS — D631 Anemia in chronic kidney disease: Secondary | ICD-10-CM | POA: Diagnosis not present

## 2018-12-13 DIAGNOSIS — N186 End stage renal disease: Secondary | ICD-10-CM | POA: Diagnosis not present

## 2018-12-13 DIAGNOSIS — D509 Iron deficiency anemia, unspecified: Secondary | ICD-10-CM | POA: Diagnosis not present

## 2018-12-14 ENCOUNTER — Other Ambulatory Visit: Payer: Self-pay | Admitting: Nurse Practitioner

## 2018-12-14 DIAGNOSIS — Z7682 Awaiting organ transplant status: Secondary | ICD-10-CM

## 2018-12-15 DIAGNOSIS — D631 Anemia in chronic kidney disease: Secondary | ICD-10-CM | POA: Diagnosis not present

## 2018-12-15 DIAGNOSIS — N186 End stage renal disease: Secondary | ICD-10-CM | POA: Diagnosis not present

## 2018-12-15 DIAGNOSIS — E119 Type 2 diabetes mellitus without complications: Secondary | ICD-10-CM | POA: Diagnosis not present

## 2018-12-15 DIAGNOSIS — N2581 Secondary hyperparathyroidism of renal origin: Secondary | ICD-10-CM | POA: Diagnosis not present

## 2018-12-15 DIAGNOSIS — D509 Iron deficiency anemia, unspecified: Secondary | ICD-10-CM | POA: Diagnosis not present

## 2018-12-18 DIAGNOSIS — N186 End stage renal disease: Secondary | ICD-10-CM | POA: Diagnosis not present

## 2018-12-18 DIAGNOSIS — E119 Type 2 diabetes mellitus without complications: Secondary | ICD-10-CM | POA: Diagnosis not present

## 2018-12-18 DIAGNOSIS — N2581 Secondary hyperparathyroidism of renal origin: Secondary | ICD-10-CM | POA: Diagnosis not present

## 2018-12-18 DIAGNOSIS — D509 Iron deficiency anemia, unspecified: Secondary | ICD-10-CM | POA: Diagnosis not present

## 2018-12-18 DIAGNOSIS — D631 Anemia in chronic kidney disease: Secondary | ICD-10-CM | POA: Diagnosis not present

## 2018-12-20 DIAGNOSIS — N2581 Secondary hyperparathyroidism of renal origin: Secondary | ICD-10-CM | POA: Diagnosis not present

## 2018-12-20 DIAGNOSIS — D509 Iron deficiency anemia, unspecified: Secondary | ICD-10-CM | POA: Diagnosis not present

## 2018-12-20 DIAGNOSIS — D631 Anemia in chronic kidney disease: Secondary | ICD-10-CM | POA: Diagnosis not present

## 2018-12-20 DIAGNOSIS — E119 Type 2 diabetes mellitus without complications: Secondary | ICD-10-CM | POA: Diagnosis not present

## 2018-12-20 DIAGNOSIS — N186 End stage renal disease: Secondary | ICD-10-CM | POA: Diagnosis not present

## 2018-12-21 ENCOUNTER — Other Ambulatory Visit: Payer: Medicare Other

## 2018-12-21 DIAGNOSIS — R110 Nausea: Secondary | ICD-10-CM | POA: Diagnosis not present

## 2018-12-21 DIAGNOSIS — J209 Acute bronchitis, unspecified: Secondary | ICD-10-CM | POA: Diagnosis not present

## 2018-12-21 DIAGNOSIS — F418 Other specified anxiety disorders: Secondary | ICD-10-CM | POA: Diagnosis not present

## 2018-12-21 DIAGNOSIS — G43109 Migraine with aura, not intractable, without status migrainosus: Secondary | ICD-10-CM | POA: Diagnosis not present

## 2018-12-21 DIAGNOSIS — I10 Essential (primary) hypertension: Secondary | ICD-10-CM | POA: Diagnosis not present

## 2018-12-22 DIAGNOSIS — N186 End stage renal disease: Secondary | ICD-10-CM | POA: Diagnosis not present

## 2018-12-22 DIAGNOSIS — D631 Anemia in chronic kidney disease: Secondary | ICD-10-CM | POA: Diagnosis not present

## 2018-12-22 DIAGNOSIS — N2581 Secondary hyperparathyroidism of renal origin: Secondary | ICD-10-CM | POA: Diagnosis not present

## 2018-12-22 DIAGNOSIS — D509 Iron deficiency anemia, unspecified: Secondary | ICD-10-CM | POA: Diagnosis not present

## 2018-12-22 DIAGNOSIS — E119 Type 2 diabetes mellitus without complications: Secondary | ICD-10-CM | POA: Diagnosis not present

## 2018-12-25 DIAGNOSIS — D509 Iron deficiency anemia, unspecified: Secondary | ICD-10-CM | POA: Diagnosis not present

## 2018-12-25 DIAGNOSIS — N186 End stage renal disease: Secondary | ICD-10-CM | POA: Diagnosis not present

## 2018-12-25 DIAGNOSIS — E119 Type 2 diabetes mellitus without complications: Secondary | ICD-10-CM | POA: Diagnosis not present

## 2018-12-25 DIAGNOSIS — D631 Anemia in chronic kidney disease: Secondary | ICD-10-CM | POA: Diagnosis not present

## 2018-12-25 DIAGNOSIS — N2581 Secondary hyperparathyroidism of renal origin: Secondary | ICD-10-CM | POA: Diagnosis not present

## 2018-12-27 DIAGNOSIS — E119 Type 2 diabetes mellitus without complications: Secondary | ICD-10-CM | POA: Diagnosis not present

## 2018-12-27 DIAGNOSIS — D509 Iron deficiency anemia, unspecified: Secondary | ICD-10-CM | POA: Diagnosis not present

## 2018-12-27 DIAGNOSIS — N186 End stage renal disease: Secondary | ICD-10-CM | POA: Diagnosis not present

## 2018-12-27 DIAGNOSIS — D631 Anemia in chronic kidney disease: Secondary | ICD-10-CM | POA: Diagnosis not present

## 2018-12-27 DIAGNOSIS — N2581 Secondary hyperparathyroidism of renal origin: Secondary | ICD-10-CM | POA: Diagnosis not present

## 2018-12-28 ENCOUNTER — Other Ambulatory Visit: Payer: Medicare Other

## 2018-12-28 DIAGNOSIS — D689 Coagulation defect, unspecified: Secondary | ICD-10-CM | POA: Diagnosis not present

## 2018-12-28 DIAGNOSIS — M5417 Radiculopathy, lumbosacral region: Secondary | ICD-10-CM | POA: Diagnosis not present

## 2018-12-28 DIAGNOSIS — N186 End stage renal disease: Secondary | ICD-10-CM | POA: Diagnosis not present

## 2018-12-28 DIAGNOSIS — G894 Chronic pain syndrome: Secondary | ICD-10-CM | POA: Diagnosis not present

## 2018-12-29 DIAGNOSIS — D631 Anemia in chronic kidney disease: Secondary | ICD-10-CM | POA: Diagnosis not present

## 2018-12-29 DIAGNOSIS — N2581 Secondary hyperparathyroidism of renal origin: Secondary | ICD-10-CM | POA: Diagnosis not present

## 2018-12-29 DIAGNOSIS — D509 Iron deficiency anemia, unspecified: Secondary | ICD-10-CM | POA: Diagnosis not present

## 2018-12-29 DIAGNOSIS — E119 Type 2 diabetes mellitus without complications: Secondary | ICD-10-CM | POA: Diagnosis not present

## 2018-12-29 DIAGNOSIS — N186 End stage renal disease: Secondary | ICD-10-CM | POA: Diagnosis not present

## 2018-12-31 DIAGNOSIS — N033 Chronic nephritic syndrome with diffuse mesangial proliferative glomerulonephritis: Secondary | ICD-10-CM | POA: Diagnosis not present

## 2018-12-31 DIAGNOSIS — N186 End stage renal disease: Secondary | ICD-10-CM | POA: Diagnosis not present

## 2018-12-31 DIAGNOSIS — Z992 Dependence on renal dialysis: Secondary | ICD-10-CM | POA: Diagnosis not present

## 2019-01-01 ENCOUNTER — Ambulatory Visit: Payer: Medicare Other | Admitting: Cardiology

## 2019-01-01 DIAGNOSIS — N186 End stage renal disease: Secondary | ICD-10-CM | POA: Diagnosis not present

## 2019-01-01 DIAGNOSIS — E119 Type 2 diabetes mellitus without complications: Secondary | ICD-10-CM | POA: Diagnosis not present

## 2019-01-01 DIAGNOSIS — L03119 Cellulitis of unspecified part of limb: Secondary | ICD-10-CM | POA: Diagnosis not present

## 2019-01-01 DIAGNOSIS — S61401D Unspecified open wound of right hand, subsequent encounter: Secondary | ICD-10-CM | POA: Diagnosis not present

## 2019-01-01 DIAGNOSIS — A4902 Methicillin resistant Staphylococcus aureus infection, unspecified site: Secondary | ICD-10-CM | POA: Diagnosis not present

## 2019-01-01 DIAGNOSIS — D631 Anemia in chronic kidney disease: Secondary | ICD-10-CM | POA: Diagnosis not present

## 2019-01-01 DIAGNOSIS — N2581 Secondary hyperparathyroidism of renal origin: Secondary | ICD-10-CM | POA: Diagnosis not present

## 2019-01-01 DIAGNOSIS — E8770 Fluid overload, unspecified: Secondary | ICD-10-CM | POA: Diagnosis not present

## 2019-01-03 DIAGNOSIS — L03119 Cellulitis of unspecified part of limb: Secondary | ICD-10-CM | POA: Diagnosis not present

## 2019-01-03 DIAGNOSIS — E119 Type 2 diabetes mellitus without complications: Secondary | ICD-10-CM | POA: Diagnosis not present

## 2019-01-03 DIAGNOSIS — N186 End stage renal disease: Secondary | ICD-10-CM | POA: Diagnosis not present

## 2019-01-03 DIAGNOSIS — S61401D Unspecified open wound of right hand, subsequent encounter: Secondary | ICD-10-CM | POA: Diagnosis not present

## 2019-01-03 DIAGNOSIS — A4902 Methicillin resistant Staphylococcus aureus infection, unspecified site: Secondary | ICD-10-CM | POA: Diagnosis not present

## 2019-01-03 DIAGNOSIS — N2581 Secondary hyperparathyroidism of renal origin: Secondary | ICD-10-CM | POA: Diagnosis not present

## 2019-01-04 ENCOUNTER — Ambulatory Visit
Admission: RE | Admit: 2019-01-04 | Discharge: 2019-01-04 | Disposition: A | Discharge status: Home / Self Care | Payer: Medicare Other | Source: Ambulatory Visit | Attending: Nurse Practitioner | Admitting: Nurse Practitioner

## 2019-01-04 DIAGNOSIS — N261 Atrophy of kidney (terminal): Secondary | ICD-10-CM | POA: Diagnosis not present

## 2019-01-04 DIAGNOSIS — Z87448 Personal history of other diseases of urinary system: Secondary | ICD-10-CM | POA: Diagnosis not present

## 2019-01-04 DIAGNOSIS — Z7682 Awaiting organ transplant status: Secondary | ICD-10-CM

## 2019-01-05 DIAGNOSIS — S61401D Unspecified open wound of right hand, subsequent encounter: Secondary | ICD-10-CM | POA: Diagnosis not present

## 2019-01-05 DIAGNOSIS — N2581 Secondary hyperparathyroidism of renal origin: Secondary | ICD-10-CM | POA: Diagnosis not present

## 2019-01-05 DIAGNOSIS — N186 End stage renal disease: Secondary | ICD-10-CM | POA: Diagnosis not present

## 2019-01-05 DIAGNOSIS — L03119 Cellulitis of unspecified part of limb: Secondary | ICD-10-CM | POA: Diagnosis not present

## 2019-01-05 DIAGNOSIS — E119 Type 2 diabetes mellitus without complications: Secondary | ICD-10-CM | POA: Diagnosis not present

## 2019-01-05 DIAGNOSIS — A4902 Methicillin resistant Staphylococcus aureus infection, unspecified site: Secondary | ICD-10-CM | POA: Diagnosis not present

## 2019-01-06 DIAGNOSIS — R030 Elevated blood-pressure reading, without diagnosis of hypertension: Secondary | ICD-10-CM | POA: Diagnosis not present

## 2019-01-06 DIAGNOSIS — Z6822 Body mass index (BMI) 22.0-22.9, adult: Secondary | ICD-10-CM | POA: Diagnosis not present

## 2019-01-06 DIAGNOSIS — S60211A Contusion of right wrist, initial encounter: Secondary | ICD-10-CM | POA: Diagnosis not present

## 2019-01-06 DIAGNOSIS — G894 Chronic pain syndrome: Secondary | ICD-10-CM | POA: Diagnosis not present

## 2019-01-06 DIAGNOSIS — N186 End stage renal disease: Secondary | ICD-10-CM | POA: Diagnosis not present

## 2019-01-08 ENCOUNTER — Other Ambulatory Visit: Payer: Self-pay

## 2019-01-08 ENCOUNTER — Emergency Department (HOSPITAL_COMMUNITY)
Admission: EM | Admit: 2019-01-08 | Discharge: 2019-01-09 | Disposition: A | Discharge status: Home / Self Care | Payer: Medicare Other | Attending: Emergency Medicine | Admitting: Emergency Medicine

## 2019-01-08 ENCOUNTER — Emergency Department (HOSPITAL_COMMUNITY): Payer: Medicare Other

## 2019-01-08 DIAGNOSIS — Z87891 Personal history of nicotine dependence: Secondary | ICD-10-CM | POA: Insufficient documentation

## 2019-01-08 DIAGNOSIS — I120 Hypertensive chronic kidney disease with stage 5 chronic kidney disease or end stage renal disease: Secondary | ICD-10-CM | POA: Insufficient documentation

## 2019-01-08 DIAGNOSIS — Z5321 Procedure and treatment not carried out due to patient leaving prior to being seen by health care provider: Secondary | ICD-10-CM | POA: Diagnosis not present

## 2019-01-08 DIAGNOSIS — R2231 Localized swelling, mass and lump, right upper limb: Secondary | ICD-10-CM | POA: Diagnosis present

## 2019-01-08 DIAGNOSIS — J45909 Unspecified asthma, uncomplicated: Secondary | ICD-10-CM | POA: Diagnosis not present

## 2019-01-08 DIAGNOSIS — L02511 Cutaneous abscess of right hand: Secondary | ICD-10-CM | POA: Insufficient documentation

## 2019-01-08 DIAGNOSIS — R200 Anesthesia of skin: Secondary | ICD-10-CM | POA: Diagnosis not present

## 2019-01-08 DIAGNOSIS — Z992 Dependence on renal dialysis: Secondary | ICD-10-CM | POA: Insufficient documentation

## 2019-01-08 DIAGNOSIS — M7989 Other specified soft tissue disorders: Secondary | ICD-10-CM | POA: Diagnosis not present

## 2019-01-08 DIAGNOSIS — R52 Pain, unspecified: Secondary | ICD-10-CM | POA: Diagnosis not present

## 2019-01-08 DIAGNOSIS — S6991XA Unspecified injury of right wrist, hand and finger(s), initial encounter: Secondary | ICD-10-CM | POA: Diagnosis not present

## 2019-01-08 DIAGNOSIS — Z79899 Other long term (current) drug therapy: Secondary | ICD-10-CM | POA: Insufficient documentation

## 2019-01-08 DIAGNOSIS — L0291 Cutaneous abscess, unspecified: Secondary | ICD-10-CM

## 2019-01-08 DIAGNOSIS — N186 End stage renal disease: Secondary | ICD-10-CM | POA: Insufficient documentation

## 2019-01-08 DIAGNOSIS — M79641 Pain in right hand: Secondary | ICD-10-CM | POA: Diagnosis not present

## 2019-01-08 LAB — COMPREHENSIVE METABOLIC PANEL
ALT: 19 U/L (ref 0–44)
AST: 15 U/L (ref 15–41)
Albumin: 3.2 g/dL — ABNORMAL LOW (ref 3.5–5.0)
Alkaline Phosphatase: 46 U/L (ref 38–126)
Anion gap: 19 — ABNORMAL HIGH (ref 5–15)
BUN: 78 mg/dL — ABNORMAL HIGH (ref 6–20)
CHLORIDE: 96 mmol/L — AB (ref 98–111)
CO2: 22 mmol/L (ref 22–32)
CREATININE: 10.16 mg/dL — AB (ref 0.44–1.00)
Calcium: 9.2 mg/dL (ref 8.9–10.3)
GFR calc non Af Amer: 4 mL/min — ABNORMAL LOW (ref >60)
GFR, EST AFRICAN AMERICAN: 5 mL/min — AB (ref >60)
Glucose, Bld: 129 mg/dL — ABNORMAL HIGH (ref 70–99)
POTASSIUM: 4.7 mmol/L (ref 3.5–5.1)
SODIUM: 137 mmol/L (ref 135–145)
Total Bilirubin: 0.9 mg/dL (ref 0.3–1.2)
Total Protein: 5.6 g/dL — ABNORMAL LOW (ref 6.5–8.1)

## 2019-01-08 LAB — CBC WITH DIFFERENTIAL/PLATELET
ABS IMMATURE GRANULOCYTES: 0.12 10*3/uL — AB (ref 0.00–0.07)
BASOS ABS: 0 10*3/uL (ref 0.0–0.1)
Basophils Relative: 0 %
EOS PCT: 0 %
Eosinophils Absolute: 0 10*3/uL (ref 0.0–0.5)
HEMATOCRIT: 29.8 % — AB (ref 36.0–46.0)
HEMOGLOBIN: 9.3 g/dL — AB (ref 12.0–15.0)
Immature Granulocytes: 1 %
LYMPHS ABS: 3.7 10*3/uL (ref 0.7–4.0)
LYMPHS PCT: 23 %
MCH: 32.4 pg (ref 26.0–34.0)
MCHC: 31.2 g/dL (ref 30.0–36.0)
MCV: 103.8 fL — ABNORMAL HIGH (ref 80.0–100.0)
Monocytes Absolute: 0.7 10*3/uL (ref 0.1–1.0)
Monocytes Relative: 4 %
NEUTROS ABS: 11.2 10*3/uL — AB (ref 1.7–7.7)
NRBC: 0 % (ref 0.0–0.2)
Neutrophils Relative %: 72 %
Platelets: 240 10*3/uL (ref 150–400)
RBC: 2.87 MIL/uL — ABNORMAL LOW (ref 3.87–5.11)
RDW: 16.3 % — ABNORMAL HIGH (ref 11.5–15.5)
WBC: 15.8 10*3/uL — ABNORMAL HIGH (ref 4.0–10.5)

## 2019-01-08 LAB — LACTIC ACID, PLASMA
LACTIC ACID, VENOUS: 0.6 mmol/L (ref 0.5–1.9)
Lactic Acid, Venous: 0.9 mmol/L (ref 0.5–1.9)

## 2019-01-08 MED ORDER — FENTANYL CITRATE (PF) 100 MCG/2ML IJ SOLN
50.0000 ug | Freq: Once | INTRAMUSCULAR | Status: AC
  Administered 2019-01-08: 50 ug via INTRAVENOUS
  Filled 2019-01-08: qty 2

## 2019-01-08 MED ORDER — SODIUM CHLORIDE 0.9% FLUSH: 3.0000 mL | Freq: Once | INTRAVENOUS | Status: DC

## 2019-01-08 MED ORDER — HYDROMORPHONE HCL 1 MG/ML IJ SOLN
0.5000 mg | Freq: Once | INTRAMUSCULAR | Status: AC
  Administered 2019-01-08: 0.5 mg via INTRAVENOUS
  Filled 2019-01-08: qty 1

## 2019-01-08 MED ORDER — OXYCODONE-ACETAMINOPHEN 5-325 MG PO TABS
1.0000 | ORAL_TABLET | ORAL | Status: DC | PRN
  Administered 2019-01-08: 1 via ORAL
  Filled 2019-01-08: qty 1

## 2019-01-08 MED ORDER — SODIUM CHLORIDE 0.9 % IV SOLN
1.0000 g | Freq: Once | INTRAVENOUS | Status: AC
  Administered 2019-01-08: 1 g via INTRAVENOUS
  Filled 2019-01-08: qty 10

## 2019-01-08 MED ORDER — LIDOCAINE-EPINEPHRINE 2 %-1:200000 IJ SOLN
20.0000 mL | Freq: Once | INTRAMUSCULAR | Status: AC
Start: 2019-01-08 — End: 2019-01-08
  Administered 2019-01-08: 20 mL
  Filled 2019-01-08: qty 20

## 2019-01-08 NOTE — Discharge Instructions (Addendum)
Hand surgery instructions:  1.  Leave the current bandage in place until tomorrow night after 6:00. 2.  At that time, remove the bandage, pull the packing out of your hand, and cleanse the wound with soap and running water, either under the sink or shower, trying to clean all of the dried blood and body fluids from the wound, keeping the wound open so that it can drain. 3.  After cleansing it, place bacitracin ointment or Polysporin ointment or Neosporin ointment into the wound, covered with a light bandage. 4.  Repeat the cleansing with re-bandaging 3 times daily 5.  Feel free to address all questions related to wound care or need for therapy with Dr. Biagio Borg office 6.  Address all questions/concerns related to your infection management with whichever physician you were instructed by the emergency room physician who would be providing on-going infection management for you outside of the emergency department.    I have discussed your antibiotic choices with nephrology. They will give you doses of antibiotics with your dialysis treatment.

## 2019-01-08 NOTE — Consult Note (Signed)
ORTHOPAEDIC CONSULTATION HISTORY & PHYSICAL REQUESTING PHYSICIAN: Mesner, Corene Cornea, MD  Chief Complaint: right hand abscess  HPI: [name] is a 48 y.o. female, presently on dialysis, who presents for evaluation of right hand problem.  She reports that she struck the hand on a table a few days ago, and has developed progressive pain and swelling on the palmar and radial aspect of the midportion of the thenar eminence.  She developed drainage from one area of broken integument.  She presented to the ED having skipped dialysis today, afebrile, but with WBC t of 15.8.  She has had a history of different abscesses, some of which required operative treatment.  Past Medical History:  Diagnosis Date  . Abscess of tendon sheath, left ankle and foot 02/14/2014  . ADD (attention deficit disorder)   . Anemia   . Anemia   . Anxiety   . Arthritis    knee, wrist, back   . Asthma   . Blood transfusion 06/2011   6 units transfused at [facility]  . Bronchitis   . Depression   . Diverticulitis   . Dizziness   . ESRD (end stage renal disease) ([place])    Hemodialysis 3 times a  week - [place]  . Gastritis   . GERD (gastroesophageal reflux disease)   . Gout   . Headache(784.0)    migraines  . Hypertension    sees Dr. Wendie Agreste  . Neuromuscular disorder ([facility])    carpal tunnel  . Obesity   . Pneumonia   . PONV (postoperative nausea and vomiting)   . Reflux   . Renal insufficiency   . Shortness of breath   . Sleep apnea    does not use CPAP   Past Surgical History:  Procedure Laterality Date  . APPLICATION OF WOUND VAC Left 06/24/2017   Procedure: APPLICATION OF WOUND VAC;  Surgeon: Renette Butters, MD;  Location: [place];  Service: Orthopedics;  Laterality: Left;  . AV FISTULA PLACEMENT  06/10/11   Left brachiocephalic AVF  . AV Fistula surgery  11/30/11   [facility] - redo fistula  . Carpel tunnel release     x 2; bilateral  . CESAREAN SECTION    . colonscopy    . DILATION AND  CURETTAGE OF UTERUS    . HYSTEROSCOPY WITH THERMACHOICE  12/10/2011   Procedure: HYSTEROSCOPY WITH THERMACHOICE;  Surgeon: Betsy Coder, MD;  Location: [facility];  Service: Gynecology;  Laterality: N/A;  . I&D EXTREMITY Left 06/12/2017   Procedure: IRRIGATION AND DEBRIDEMENT FOOT;  Surgeon: Marchia Bond, MD;  Location: [place];  Service: Orthopedics;  Laterality: Left;  . I&D EXTREMITY Left 06/14/2017   Procedure: IRRIGATION AND DEBRIDEMENT EXTREMITY/WOUND CLOSURE;  Surgeon: Renette Butters, MD;  Location: [place];  Service: Orthopedics;  Laterality: Left;  . I&D EXTREMITY Left 06/27/2017   Procedure: IRRIGATION AND DEBRIDEMENT LEFT TIBIA;  Surgeon: Renette Butters, MD;  Location: [place];  Service: Orthopedics;  Laterality: Left;  . INCISION AND DRAINAGE ABSCESS Left 06/24/2017   Procedure: INCISION AND DRAINAGE ABSCESS LEFT TIBIA AND APPLICATION OF WOUND VAC;  Surgeon: Renette Butters, MD;  Location: [place];  Service: Orthopedics;  Laterality: Left;  . IRRIGATION AND DEBRIDEMENT ABSCESS Left 02/15/2014   Procedure: IRRIGATION AND DRAINAGE OF LOW BACK ABSCESS;  Surgeon: Imogene Burn. Georgette Dover, MD;  Location: [place];  Service: General;  Laterality: Left;  . microdiskectomy  04/2001   Herniated disk L5-S1  . RENAL BIOPSY  04/2004  .  REVISON OF ARTERIOVENOUS FISTULA Left 01/04/2017   Procedure: REVISON OF ARTERIOVENOUS FISTULA; CEPHALIC VEIN TURNDOWN;  Surgeon: Angelia Mould, MD;  Location: [place];  Service: Vascular;  Laterality: Left;  . REVISON OF ARTERIOVENOUS FISTULA Left 03/02/2018   Procedure: REVISION OF BRACHIOCEPHALIC ARTERIOVENOUS FISTULA LEFT UPPER ARM USING ARTEGRAFT;  Surgeon: Waynetta Sandy, MD;  Location: [place];  Service: Vascular;  Laterality: Left;  . SHUNTOGRAM  Dec. 18, 2013   Left arm  . SHUNTOGRAM N/A 10/18/2012   Procedure: Earney Mallet;  Surgeon: Conrad Hornbrook, MD;  Location: [facility] CATH LAB;  Service: Cardiovascular;  Laterality: N/A;  . SHUNTOGRAM Left 04/18/2013   Procedure:  Earney Mallet;  Surgeon: Serafina Mitchell, MD;  Location: [facility] CATH LAB;  Service: Cardiovascular;  Laterality: Left;  . SPINE SURGERY  2004   Lumbar diskectomy   . UPPER GASTROINTESTINAL ENDOSCOPY    . WISDOM TOOTH EXTRACTION     Social History   Socioeconomic History  . Marital status: Married    Spouse name: Not on file  . Number of children: Not on file  . Years of education: Not on file  . Highest education level: Not on file  Occupational History  . Not on file  Social Needs  . Financial resource strain: Not on file  . Food insecurity:    Worry: Not on file    Inability: Not on file  . Transportation needs:    Medical: Not on file    Non-medical: Not on file  Tobacco Use  . Smoking status: Former Smoker    Packs/day: 0.50    Years: 18.00    Pack years: 9.00    Types: Cigarettes    Last attempt to quit: 10/01/2017    Years since quitting: 1.2  . Smokeless tobacco: Never Used  Substance and Sexual Activity  . Alcohol use: Not Currently    Comment: occasional  . Drug use: No  . Sexual activity: Not Currently    Birth control/protection: Condom  Lifestyle  . Physical activity:    Days per week: Not on file    Minutes per session: Not on file  . Stress: Not on file  Relationships  . Social connections:    Talks on phone: Not on file    Gets together: Not on file    Attends religious service: Not on file    Active member of club or organization: Not on file    Attends meetings of clubs or organizations: Not on file    Relationship status: Not on file  Other Topics Concern  . Not on file  Social History Narrative  . Not on file   Family History  Problem Relation Age of Onset  . Hypertension Mother   . Heart disease Father        Heart Disease before age 4  . Hypertension Father   . Heart attack Father   . Hypertension Daughter    Allergies  Allergen Reactions  . Nsaids Other (See Comments)    Renal failure  . Tolmetin Other (See Comments)    Renal  failure  . Clindamycin/Lincomycin Hives and Rash  . Demerol Nausea And Vomiting and Other (See Comments)    Severe headaches  . Fentanyl Hives, Nausea And Vomiting, Rash and Other (See Comments)    Reaction to patches only  . Other Other (See Comments)    Seeds and nuts- has diverticulitis   . Vancomycin Hives and Other (See Comments)    ? 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  Marland Kitchen  Clarithromycin Nausea And Vomiting  . Morphine And Related Nausea And Vomiting and Other (See Comments)    Severe headache, Pt does not want it!   Prior to Admission medications   Medication Sig Start Date End Date Taking? Authorizing Provider  acetaminophen (TYLENOL) 325 MG tablet Take 2 tablets (650 mg total) by mouth every 8 (eight) hours as needed for mild pain, fever or headache. 06/14/17   Everrett Coombe, MD  albuterol (PROVENTIL HFA;VENTOLIN HFA) 108 (90 Base) MCG/ACT inhaler Inhale 2 puffs into the lungs every 6 (six) hours as needed for shortness of breath. 03/11/18   Danford, Suann Larry, MD  ALPRAZolam Duanne Moron) 1 MG tablet Take 1 mg by mouth 3 (three) times daily.    [provider]  amLODipine (NORVASC) 10 MG tablet Take 10 mg by mouth at bedtime.  02/08/14   [provider]  budesonide-formoterol (SYMBICORT) 160-4.5 MCG/ACT inhaler Inhale 2 puffs into the lungs 2 (two) times daily.    [provider]  buPROPion (WELLBUTRIN SR) 150 MG 12 hr tablet Take 150 mg by mouth daily. 04/15/17   [provider]  calcium acetate (PHOSLO) 667 MG capsule Take 2,001 mg by mouth 2 (two) times daily with a meal.    [provider]  cyclobenzaprine (FLEXERIL) 10 MG tablet Take 20 mg by mouth at bedtime.     [provider]  HYDROmorphone (DILAUDID) 2 MG tablet Take 0.5 tablets (1 mg total) by mouth every 6 (six) hours as needed for moderate pain or severe pain. 06/28/17   Sherene Sires, DO  lidocaine-prilocaine (EMLA) cream Apply 1 application topically See admin instructions. Apply topically  one hour prior to dialysis - Monday, Wednesday, Friday    [provider]  Melatonin 5 MG TABS Take 5 mg by mouth at bedtime.     [provider]  metoprolol tartrate (LOPRESSOR) 25 MG tablet Take 1 tablet (25 mg total) by mouth 2 (two) times daily. 06/14/17   Everrett Coombe, MD  montelukast (SINGULAIR) 10 MG tablet Take 10 mg by mouth daily.     [provider]  multivitamin (RENA-VIT) TABS tablet Take 1 tablet by mouth daily.    [provider]  nicotine (NICODERM CQ - DOSED IN MG/24 HOURS) 14 mg/24hr patch Place 1 patch (14 mg total) onto the skin daily. 06/28/17   Sherene Sires, DO  oxyCODONE ER (XTAMPZA ER) 36 MG C12A Take 36 mg by mouth 2 (two) times daily.    [provider]  sevelamer carbonate (RENVELA) 800 MG tablet Take 2,400 mg by mouth 2 (two) times daily with a meal.     [provider]  SUMAtriptan (IMITREX) 50 MG tablet Take 50 mg by mouth daily as needed for migraine or headache.  02/14/14   [provider]   Dg Wrist Complete Right  Result Date: 01/08/2019 CLINICAL DATA:  48 y/o F; blister on the radial sided wrist between the first MCP joint and the wrist. Evaluate for osteomyelitis. EXAM: RIGHT WRIST - COMPLETE 3+ VIEW COMPARISON:  None. FINDINGS: There is no evidence of fracture or dislocation. There is no evidence of arthropathy or other focal bone abnormality. Soft tissue swelling at the radial side of the hand overlying first carpometacarpal joint. IMPRESSION: No radiographic evidence of osteomyelitis. Soft tissue swelling at the radial side of the hand overlying first carpometacarpal joint. Electronically Signed   By: Kristine Garbe M.D.   On: 01/08/2019 18:07    Positive ROS: All other systems have  been reviewed and were otherwise negative with the exception of those mentioned in the HPI and as above.  Physical Exam: Vitals: Refer to EMR. Constitutional:  WD, WN, NAD HEENT:  NCAT, EOMI Neuro/Psych:  Alert  & oriented to person, place, and time; appropriate mood & affect Lymphatic: No generalized extremity edema or lymphadenopathy Extremities / MSK:  The extremities are normal with respect to appearance, ranges of motion, joint stability, muscle strength/tone, sensation, & perfusion except as otherwise noted:  There is a curvilinear shaped area of swelling about 1 to 2 cm in width, but 4 cm in length coursing in a slight curve on the thenar eminence, largely overlying the midportion of the first metacarpal, and wrapping from volar to radial onto the dorsum.  At first it appeared as if this could be a intradermal abscess, but when unroofing the epidermis, it was apparent that there was deeper involvement.  Flexor extensor tendons appear to be intact  Assessment: Right hand subcutaneous abscess, in patient on renal dialysis.  Plan/Procedure: I discussed these findings with her and recommended bedside incision and drainage.  The EDP had previously indicated he was comfortable performing this, but I volunteered to do it instead.  After obtaining verbal consent, I anesthetized the region with local infiltration of lidocaine bearing epinephrine, prepped with Betadine, and started with spreading dissection through the small break in the integument.  I then extended along the course of the abscess in a slight curvilinear fashion, ultimately though with a 1 to 2 inch opening in the skin.  There was some scant flocculent material encountered and cultures were obtained, but there was not an abundance of liquid purulence.  The tissues were indurated, and spreading dissection was carried out throughout the course to ensure that there were no loculated pockets, etc.  The skin edges were then excisionally debrided as well to include skin and subcutaneous tissues and the wound copiously irrigated.  Packing was placed consisting of iodoform and a dressing was applied. Although I had previously agreed with a plan for bedside  I&D with admission for antibiotics, patient strongly prefers not to be admitted, so Dr. Dayna Barker will confer with nephrology and/or infectious diseases regarding the infection management beyond the bedside I&D, particularly as it relates to antibiotic selection and plan for administration given her dialysis status.  I defer all aspects regarding management of her infection to the plan that they devise.  However, with regard to primarily the surgical aspects of her care, I have provided instructions to her regarding pulling the packing, and wound care to be initiated at that point.  I will need to evaluate her in the office 10-15 days to check upon the healing of the wound itself and any need for formal therapy.  Rayvon Char [address], [place] [address] [place], [place]  [address] Office: [phone] Mobile: [phone]  01/08/2019, 9:45 PM

## 2019-01-08 NOTE — ED Provider Notes (Signed)
Emergency Department Provider Note   I have reviewed the triage vital signs and the nursing notes.   HISTORY  Chief Complaint No chief complaint on file.   HPI [name] is a 48 y.o. female with multiple medical problems as documented below to include dialysis and history of osteo-myelitis who presents the emergency department today with a right hand wound.  Patient states she hit the lateral portion of her right thumb on a table few days ago and has had progressively worsening pain and now having redness and swelling to the area with purulent drainage.  No fevers.  She did not get dialysis today.  States symptoms are progressively worsening but localized to the area of concern.  No fevers. No other associated or modifying symptoms.    Past Medical History:  Diagnosis Date  . Abscess of tendon sheath, left ankle and foot 02/14/2014  . ADD (attention deficit disorder)   . Anemia   . Anemia   . Anxiety   . Arthritis    knee, wrist, back   . Asthma   . Blood transfusion 06/2011   6 units transfused at [facility]  . Bronchitis   . Depression   . Diverticulitis   . Dizziness   . ESRD (end stage renal disease) ([place])    Hemodialysis 3 times a  week - [place]  . Gastritis   . GERD (gastroesophageal reflux disease)   . Gout   . Headache(784.0)    migraines  . Hypertension    sees Dr. Wendie Agreste  . Neuromuscular disorder (HCC)    carpal tunnel  . Obesity   . Pneumonia   . PONV (postoperative nausea and vomiting)   . Reflux   . Renal insufficiency   . Shortness of breath   . Sleep apnea    does not use CPAP    Patient Active Problem List   Diagnosis Date Noted  . HCAP (healthcare-associated pneumonia) 03/09/2018  . Left upper extremity swelling 03/09/2018  . Cellulitis 06/23/2017  . Chronic pain syndrome   . Cellulitis of left lower extremity   . Tenosynovitis of ankle   . Heart murmur   . Sepsis ([place]) 02/15/2014  . Abscess of tendon sheath, left  ankle and foot 02/14/2014  . Thinning of skin 04/09/2013  . Mechanical complication of other vascular device, implant, and graft 04/09/2013  . Pain, limb, left 10/18/2012  . Swollen 10/18/2012  . Status post endometrial ablation 12/10/2011  . ESRD on dialysis ([place]) 11/24/2011  . Renal failure (ARF), acute on chronic (HCC) 10/19/2011  . HTN (hypertension) 10/19/2011  . Anemia 10/19/2011    Past Surgical History:  Procedure Laterality Date  . APPLICATION OF WOUND VAC Left 06/24/2017   Procedure: APPLICATION OF WOUND VAC;  Surgeon: Renette Butters, MD;  Location: [place];  Service: Orthopedics;  Laterality: Left;  . AV FISTULA PLACEMENT  06/10/11   Left brachiocephalic AVF  . AV Fistula surgery  11/30/11   [facility] - redo fistula  . Carpel tunnel release     x 2; bilateral  . CESAREAN SECTION    . colonscopy    . DILATION AND CURETTAGE OF UTERUS    . HYSTEROSCOPY WITH THERMACHOICE  12/10/2011   Procedure: HYSTEROSCOPY WITH THERMACHOICE;  Surgeon: Betsy Coder, MD;  Location: [facility];  Service: Gynecology;  Laterality: N/A;  . I&D EXTREMITY Left 06/12/2017   Procedure: IRRIGATION AND DEBRIDEMENT FOOT;  Surgeon: Marchia Bond, MD;  Location: [place];  Service: Orthopedics;  Laterality: Left;  . I&D EXTREMITY Left 06/14/2017   Procedure: IRRIGATION AND DEBRIDEMENT EXTREMITY/WOUND CLOSURE;  Surgeon: Renette Butters, MD;  Location: [place];  Service: Orthopedics;  Laterality: Left;  . I&D EXTREMITY Left 06/27/2017   Procedure: IRRIGATION AND DEBRIDEMENT LEFT TIBIA;  Surgeon: Renette Butters, MD;  Location: [place];  Service: Orthopedics;  Laterality: Left;  . INCISION AND DRAINAGE ABSCESS Left 06/24/2017   Procedure: INCISION AND DRAINAGE ABSCESS LEFT TIBIA AND APPLICATION OF WOUND VAC;  Surgeon: Renette Butters, MD;  Location: [place];  Service: Orthopedics;  Laterality: Left;  . IRRIGATION AND DEBRIDEMENT ABSCESS Left 02/15/2014   Procedure: IRRIGATION AND DRAINAGE OF LOW BACK ABSCESS;   Surgeon: Imogene Burn. Georgette Dover, MD;  Location: [place];  Service: General;  Laterality: Left;  . microdiskectomy  04/2001   Herniated disk L5-S1  . RENAL BIOPSY  04/2004  . REVISON OF ARTERIOVENOUS FISTULA Left 01/04/2017   Procedure: REVISON OF ARTERIOVENOUS FISTULA; CEPHALIC VEIN TURNDOWN;  Surgeon: Angelia Mould, MD;  Location: [place];  Service: Vascular;  Laterality: Left;  . REVISON OF ARTERIOVENOUS FISTULA Left 03/02/2018   Procedure: REVISION OF BRACHIOCEPHALIC ARTERIOVENOUS FISTULA LEFT UPPER ARM USING ARTEGRAFT;  Surgeon: Waynetta Sandy, MD;  Location: [place];  Service: Vascular;  Laterality: Left;  . SHUNTOGRAM  Dec. 18, 2013   Left arm  . SHUNTOGRAM N/A 10/18/2012   Procedure: Earney Mallet;  Surgeon: [place] Holiday, MD;  Location: [facility] C[facility];  Service: Cardiovascular;  Laterality: N/A;  . SHUNTOGRAM Left 04/18/2013   Procedure: Earney Mallet;  Surgeon: Serafina Mitchell, MD;  Location: [facility] CATH LAB;  Service: Cardiovascular;  Laterality: Left;  . SPINE SURGERY  2004   Lumbar diskectomy   . UPPER GASTROINTESTINAL ENDOSCOPY    . WISDOM TOOTH EXTRACTION      Current Outpatient Rx  . Order #: [id number] Class: Normal  . Order #: [id number] Class: Normal  . Order #: [id number] Class: Historical Med  . Order #: [id number] Class: Historical Med  . Order #: [id number] Class: Historical Med  . Order #: [id number] Class: Historical Med  . Order #: [id number] Class: Historical Med  . Order #: [id number] Class: Historical Med  . Order #: [id number] Class: Print  . Order #: [id number] Class: Historical Med  . Order #: [id number] Class: Historical Med  . Order #: [id number] Class: Normal  . Order #: [id number] Class: Historical Med  . Order #: [id number] Class: Historical Med  . Order #: [id number] Class: Normal  . Order #: [id number] Class: Historical Med  . Order #: [id number] Class: Historical Med  . Order #: [id number] Class: Historical Med    Allergies Nsaids; Tolmetin; Clindamycin/lincomycin; Demerol; Fentanyl; Other;  Vancomycin; Clarithromycin; and Morphine and related  Family History  Problem Relation Age of Onset  . Hypertension Mother   . Heart disease Father        Heart Disease before age 66  . Hypertension Father   . Heart attack Father   . Hypertension Daughter     Social History Social History   Tobacco Use  . Smoking status: Former Smoker    Packs/day: 0.50    Years: 18.00    Pack years: 9.00    Types: Cigarettes    Last attempt to quit: 10/01/2017    Years since quitting: 1.2  . Smokeless tobacco: Never Used  Substance Use Topics  . Alcohol use: Not Currently    Comment: occasional  . Drug use: No    Review of Systems  All other systems negative except as documented in the HPI.  All pertinent positives and negatives as reviewed in the HPI. ____________________________________________   PHYSICAL EXAM:  VITAL SIGNS: ED Triage Vitals [01/08/19 1203]  Enc Vitals Group     BP (!) 144/86     Pulse Rate 97     Resp 20     Temp (!) 97.5 F (36.4 C)     Temp Source Oral     SpO2 97 %    Constitutional: Alert and oriented. Well appearing and in no acute distress. Eyes: Conjunctivae are normal. PERRL. EOMI. Head: Atraumatic. Nose: No congestion/rhinnorhea. Mouth/Throat: Mucous membranes are moist.  Oropharynx non-erythematous. Neck: No stridor.  No meningeal signs.   Cardiovascular: Normal rate, regular rhythm. Good peripheral circulation. Grossly normal heart sounds.   Respiratory: Normal respiratory effort.  No retractions. Lungs CTAB. Gastrointestinal: Soft and nontender. No distention.  Musculoskeletal: No lower extremity tenderness nor edema. No gross deformities of extremities. Neurologic:  Normal speech and language. No gross focal neurologic deficits are appreciated.  Skin:  5x4 cm area of erythematous ring and pale skin inside with 1x3 cm area of edema and fluctuance with purulent drainage.  ____________________________________________   LABS (all labs  ordered are listed, but only abnormal results are displayed)  Labs Reviewed  COMPREHENSIVE METABOLIC PANEL - Abnormal; Notable for the following components:      Result Value   Chloride 96 (*)    Glucose, Bld 129 (*)    BUN 78 (*)    Creatinine, Ser 10.16 (*)    Total Protein 5.6 (*)    Albumin 3.2 (*)    GFR calc non Af Amer 4 (*)    GFR calc Af Amer 5 (*)    Anion gap 19 (*)    All other components within normal limits  CBC WITH DIFFERENTIAL/PLATELET - Abnormal; Notable for the following components:   WBC 15.8 (*)    RBC 2.87 (*)    Hemoglobin 9.3 (*)    HCT 29.8 (*)    MCV 103.8 (*)    RDW 16.3 (*)    Neutro Abs 11.2 (*)    Abs Immature Granulocytes 0.12 (*)    All other components within normal limits  AEROBIC/ANAEROBIC CULTURE (SURGICAL/DEEP WOUND)  GRAM STAIN  LACTIC ACID, PLASMA  LACTIC ACID, PLASMA  URINALYSIS, ROUTINE W REFLEX MICROSCOPIC   ____________________________________________  RADIOLOGY  Dg Wrist Complete Right  Result Date: 01/08/2019 CLINICAL DATA:  48 y/o F; blister on the radial sided wrist between the first MCP joint and the wrist. Evaluate for osteomyelitis. EXAM: RIGHT WRIST - COMPLETE 3+ VIEW COMPARISON:  None. FINDINGS: There is no evidence of fracture or dislocation. There is no evidence of arthropathy or other focal bone abnormality. Soft tissue swelling at the radial side of the hand overlying first carpometacarpal joint. IMPRESSION: No radiographic evidence of osteomyelitis. Soft tissue swelling at the radial side of the hand overlying first carpometacarpal joint. Electronically Signed   By: Kristine Garbe M.D.   On: 01/08/2019 18:07    ____________________________________________   INITIAL IMPRESSION / ASSESSMENT AND PLAN / ED COURSE  Patient with likely abscess of her right hand with surrounding cellulitis.  Will work on pain control, antibiotics.  X-ray to make sure no fractures or obvious evidence of early osteomyelitis.  If  this is fine with discussed with hand surgery, Dr. Grandville Silos, to ensure he is okay with [place] doing incision and drainage and have him follow-up with him.  Decide on admission based on pain control and consultant.  Dr. Grandville Silos  initially was okay with bedside incision and drainage but ultimately he had to come see another patient so performed himself as per his note.  He will follow-up as an outpatient.  Recommends antibiotics.  I discussed with Dr. Moshe Cipro who will put in an order for IV antibiotics with dialysis.  Antibiotics started here.  Patient was discharged and will be discharged with same.   Pertinent labs & imaging results that were available during my care of the patient were reviewed by me and considered in my medical decision making (see chart for details).  ____________________________________________  FINAL CLINICAL IMPRESSION(S) / ED DIAGNOSES  Final diagnoses:  Abscess    MEDICATIONS GIVEN DURING THIS VISIT:  Medications  oxyCODONE-acetaminophen (PERCOCET/ROXICET) 5-325 MG per tablet 1 tablet (1 tablet Oral Given 01/08/19 1214)  sodium chloride flush (NS) 0.9 % injection 3 mL (3 mLs Intravenous Not Given 01/08/19 1706)  fentaNYL (SUBLIMAZE) injection 50 mcg (50 mcg Intravenous Given 01/08/19 1700)  cefTRIAXone (ROCEPHIN) 1 g in sodium chloride 0.9 % 100 mL IVPB (0 g Intravenous Stopped 01/08/19 1718)  HYDROmorphone (DILAUDID) injection 0.5 mg (0.5 mg Intravenous Given 01/08/19 1858)  lidocaine-EPINEPHrine (XYLOCAINE W/EPI) 2 %-1:200000 (PF) injection 20 mL (20 mLs Infiltration Given by Other 01/08/19 2225)     NEW OUTPATIENT MEDICATIONS STARTED DURING THIS VISIT:  New Prescriptions   No medications on file    Note:  This note was prepared with assistance of Dragon voice recognition software. Occasional wrong-word or sound-a-like substitutions may have occurred due to the inherent limitations of voice recognition software.   Layloni Fahrner, Corene Cornea, MD 01/09/19 [phone]

## 2019-01-08 NOTE — ED Triage Notes (Signed)
Patient reports R hand pain since Thursday after hitting it up against something. States it is infected now - hot to touch, swollen, and red. Denies fevers/chills, but is dialysis patient (MWF, has not had it today) and reports last time this happened, she had to have surgery for infection.

## 2019-01-10 ENCOUNTER — Telehealth: Payer: Self-pay | Admitting: *Deleted

## 2019-01-10 NOTE — Telephone Encounter (Signed)
Contacted Dr Milly Jakob office to update on pt's call to ED NCM. Office will attempt to reach out to pt to schedule appt. Jonnie Finner RN CCM Case Mgmt phone [phone]

## 2019-01-10 NOTE — Telephone Encounter (Signed)
ED received call from pt's family requesting call back. States pt did not receive any pain medication after her dc from ED. NCM contacted pt via phone, states she was recently seen in ED but did not have anything to help with pain. States she is having pain and swelling. Explained to pt that if experiencing any complications to seek medical attention in ED or contact, surgeon, Dr Grandville Silos (provided contact number). Pt states she called her PCP but they requested she follow up with surgeon or ED. Pt states she will follow up at [facility] ED and/or call surgeon's office. Jonnie Finner RN CCM Case Mgmt phone [phone]

## 2019-01-11 ENCOUNTER — Other Ambulatory Visit: Payer: Self-pay

## 2019-01-11 ENCOUNTER — Emergency Department (HOSPITAL_COMMUNITY): Payer: Medicare Other

## 2019-01-11 ENCOUNTER — Encounter (HOSPITAL_COMMUNITY): Payer: Self-pay

## 2019-01-11 ENCOUNTER — Emergency Department (HOSPITAL_COMMUNITY)
Admission: EM | Admit: 2019-01-11 | Discharge: 2019-01-12 | Disposition: A | Discharge status: Home / Self Care | Payer: Medicare Other | Attending: Emergency Medicine | Admitting: Emergency Medicine

## 2019-01-11 DIAGNOSIS — L03113 Cellulitis of right upper limb: Secondary | ICD-10-CM | POA: Diagnosis not present

## 2019-01-11 DIAGNOSIS — E119 Type 2 diabetes mellitus without complications: Secondary | ICD-10-CM | POA: Diagnosis not present

## 2019-01-11 DIAGNOSIS — N186 End stage renal disease: Secondary | ICD-10-CM | POA: Diagnosis not present

## 2019-01-11 DIAGNOSIS — N2581 Secondary hyperparathyroidism of renal origin: Secondary | ICD-10-CM | POA: Diagnosis not present

## 2019-01-11 DIAGNOSIS — Z79899 Other long term (current) drug therapy: Secondary | ICD-10-CM | POA: Insufficient documentation

## 2019-01-11 DIAGNOSIS — Z87891 Personal history of nicotine dependence: Secondary | ICD-10-CM | POA: Insufficient documentation

## 2019-01-11 DIAGNOSIS — I120 Hypertensive chronic kidney disease with stage 5 chronic kidney disease or end stage renal disease: Secondary | ICD-10-CM | POA: Diagnosis not present

## 2019-01-11 DIAGNOSIS — B9562 Methicillin resistant Staphylococcus aureus infection as the cause of diseases classified elsewhere: Secondary | ICD-10-CM | POA: Diagnosis not present

## 2019-01-11 DIAGNOSIS — Z22322 Carrier or suspected carrier of Methicillin resistant Staphylococcus aureus: Secondary | ICD-10-CM

## 2019-01-11 DIAGNOSIS — L03119 Cellulitis of unspecified part of limb: Secondary | ICD-10-CM | POA: Diagnosis not present

## 2019-01-11 DIAGNOSIS — S61501A Unspecified open wound of right wrist, initial encounter: Secondary | ICD-10-CM | POA: Diagnosis not present

## 2019-01-11 DIAGNOSIS — Z992 Dependence on renal dialysis: Secondary | ICD-10-CM | POA: Insufficient documentation

## 2019-01-11 DIAGNOSIS — S61401D Unspecified open wound of right hand, subsequent encounter: Secondary | ICD-10-CM | POA: Diagnosis not present

## 2019-01-11 DIAGNOSIS — A4902 Methicillin resistant Staphylococcus aureus infection, unspecified site: Secondary | ICD-10-CM | POA: Diagnosis not present

## 2019-01-11 LAB — CBC WITH DIFFERENTIAL/PLATELET
Abs Immature Granulocytes: 0.06 10*3/uL (ref 0.00–0.07)
Basophils Absolute: 0 10*3/uL (ref 0.0–0.1)
Basophils Relative: 0 %
EOS PCT: 0 %
Eosinophils Absolute: 0 10*3/uL (ref 0.0–0.5)
HCT: 28.9 % — ABNORMAL LOW (ref 36.0–46.0)
Hemoglobin: 9.4 g/dL — ABNORMAL LOW (ref 12.0–15.0)
Immature Granulocytes: 1 %
Lymphocytes Relative: 18 %
Lymphs Abs: 1.6 10*3/uL (ref 0.7–4.0)
MCH: 32.5 pg (ref 26.0–34.0)
MCHC: 32.5 g/dL (ref 30.0–36.0)
MCV: 100 fL (ref 80.0–100.0)
MONOS PCT: 6 %
Monocytes Absolute: 0.5 10*3/uL (ref 0.1–1.0)
Neutro Abs: 6.8 10*3/uL (ref 1.7–7.7)
Neutrophils Relative %: 75 %
Platelets: 231 10*3/uL (ref 150–400)
RBC: 2.89 MIL/uL — ABNORMAL LOW (ref 3.87–5.11)
RDW: 16.5 % — ABNORMAL HIGH (ref 11.5–15.5)
WBC: 8.9 10*3/uL (ref 4.0–10.5)
nRBC: 0 % (ref 0.0–0.2)

## 2019-01-11 LAB — COMPREHENSIVE METABOLIC PANEL
ALT: 13 U/L (ref 0–44)
AST: 16 U/L (ref 15–41)
Albumin: 3.2 g/dL — ABNORMAL LOW (ref 3.5–5.0)
Alkaline Phosphatase: 62 U/L (ref 38–126)
Anion gap: 15 (ref 5–15)
BUN: 43 mg/dL — AB (ref 6–20)
CALCIUM: 8.7 mg/dL — AB (ref 8.9–10.3)
CO2: 28 mmol/L (ref 22–32)
Chloride: 94 mmol/L — ABNORMAL LOW (ref 98–111)
Creatinine, Ser: 7.55 mg/dL — ABNORMAL HIGH (ref 0.44–1.00)
GFR calc Af Amer: 7 mL/min — ABNORMAL LOW (ref >60)
GFR calc non Af Amer: 6 mL/min — ABNORMAL LOW (ref >60)
Glucose, Bld: 83 mg/dL (ref 70–99)
Potassium: 4.4 mmol/L (ref 3.5–5.1)
Sodium: 137 mmol/L (ref 135–145)
Total Bilirubin: 0.8 mg/dL (ref 0.3–1.2)
Total Protein: 6.1 g/dL — ABNORMAL LOW (ref 6.5–8.1)

## 2019-01-11 LAB — PROTIME-INR
INR: 1 (ref 0.8–1.2)
Prothrombin Time: 12.8 seconds (ref 11.4–15.2)

## 2019-01-11 LAB — AEROBIC CULTURE W GRAM STAIN (SUPERFICIAL SPECIMEN)

## 2019-01-11 LAB — LACTIC ACID, PLASMA: Lactic Acid, Venous: 0.6 mmol/L (ref 0.5–1.9)

## 2019-01-11 LAB — I-STAT BETA HCG BLOOD, ED (MC, WL, AP ONLY): I-stat hCG, quantitative: 5.1 m[IU]/mL — ABNORMAL HIGH (ref <5)

## 2019-01-11 LAB — AEROBIC CULTURE  (SUPERFICIAL SPECIMEN)

## 2019-01-11 MED ORDER — SODIUM CHLORIDE 0.9% FLUSH
3.0000 mL | Freq: Once | INTRAVENOUS | Status: AC
  Administered 2019-01-11: 3 mL via INTRAVENOUS

## 2019-01-11 MED ORDER — HYDROMORPHONE HCL 1 MG/ML IJ SOLN
1.0000 mg | Freq: Once | INTRAMUSCULAR | Status: AC
  Administered 2019-01-11: 1 mg via INTRAVENOUS
  Filled 2019-01-11: qty 1

## 2019-01-11 MED ORDER — VANCOMYCIN HCL 10 G IV SOLR
1500.0000 mg | Freq: Once | INTRAVENOUS | Status: DC
  Filled 2019-01-11: qty 1500

## 2019-01-11 NOTE — ED Provider Notes (Signed)
[facility] EMERGENCY DEPARTMENT Provider Note   CSN: [id number] Arrival date & time: 01/11/19  2024    History   Chief Complaint Chief Complaint  Patient presents with  . Abscess    HPI [name] is a 48 y.o. female who presents with R hand infection. PMH significant for ESRD on dialysis M/W/F, hx of osteomyelitis, chronic pain syndrome, narcotic dependence. She takes extended release Oxycodone and Hydromorphone daily. She originally cut her hand several days ago. She came to the ED three days ago and abscess was I&D'd by Dr. Grandville Silos. She has been receiving antibiotics at dialysis but has also missed two sessions because of the pain so has only had one dose. She goes to [facility] in [place] [employer]. She went for a full session today because they allowed her to come on Thursday. She states she still plans to go tomorrow for a session. She doesn't feel like the wound is getting better at all and pain is uncontrolled despite taking her home meds. She called Dr. Biagio Borg office but was unhappy with his care so decieded to come to the ED. Culture grew out MRSA.     HPI  Past Medical History:  Diagnosis Date  . Abscess of tendon sheath, left ankle and foot 02/14/2014  . ADD (attention deficit disorder)   . Anemia   . Anemia   . Anxiety   . Arthritis    knee, wrist, back   . Asthma   . Blood transfusion 06/2011   6 units transfused at [facility]  . Bronchitis   . Depression   . Diverticulitis   . Dizziness   . ESRD (end stage renal disease) ([place])    Hemodialysis 3 times a  week - [place]  . Gastritis   . GERD (gastroesophageal reflux disease)   . Gout   . Headache(784.0)    migraines  . Hypertension    sees Dr. Wendie Agreste  . Neuromuscular disorder (HCC)    carpal tunnel  . Obesity   . Pneumonia   . PONV (postoperative nausea and vomiting)   . Reflux   . Renal insufficiency   . Shortness of breath   . Sleep apnea    does not use CPAP     Patient Active Problem List   Diagnosis Date Noted  . HCAP (healthcare-associated pneumonia) 03/09/2018  . Left upper extremity swelling 03/09/2018  . Cellulitis 06/23/2017  . Chronic pain syndrome   . Cellulitis of left lower extremity   . Tenosynovitis of ankle   . Heart murmur   . Sepsis ([place]) 02/15/2014  . Abscess of tendon sheath, left ankle and foot 02/14/2014  . Thinning of skin 04/09/2013  . Mechanical complication of other vascular device, implant, and graft 04/09/2013  . Pain, limb, left 10/18/2012  . Swollen 10/18/2012  . Status post endometrial ablation 12/10/2011  . ESRD on dialysis ([place]) 11/24/2011  . Renal failure (ARF), acute on chronic (HCC) 10/19/2011  . HTN (hypertension) 10/19/2011  . Anemia 10/19/2011    Past Surgical History:  Procedure Laterality Date  . APPLICATION OF WOUND VAC Left 06/24/2017   Procedure: APPLICATION OF WOUND VAC;  Surgeon: Renette Butters, MD;  Location: [place];  Service: Orthopedics;  Laterality: Left;  . AV FISTULA PLACEMENT  06/10/11   Left brachiocephalic AVF  . AV Fistula surgery  11/30/11   [facility]ac[facility] Pontes - redo fistula  . Carpel tunnel release     x 2; bilateral  . CESAREAN SECTION    .  colonscopy    . DILATION AND CURETTAGE OF UTERUS    . HYSTEROSCOPY WITH THERMACHOICE  12/10/2011   Procedure: HYSTEROSCOPY WITH THERMACHOICE;  Surgeon: Betsy Coder, MD;  Location: [facility];  Service: Gynecology;  Laterality: N/A;  . I&D EXTREMITY Left 06/12/2017   Procedure: IRRIGATION AND DEBRIDEMENT FOOT;  Surgeon: Marchia Bond, MD;  Location: [place];  Service: Orthopedics;  Laterality: Left;  . I&D EXTREMITY Left 06/14/2017   Procedure: IRRIGATION AND DEBRIDEMENT EXTREMITY/WOUND CLOSURE;  Surgeon: Renette Butters, MD;  Location: [place];  Service: Orthopedics;  Laterality: Left;  . I&D EXTREMITY Left 06/27/2017   Procedure: IRRIGATION AND DEBRIDEMENT LEFT TIBIA;  Surgeon: Renette Butters, MD;  Location: [place];  Service: Orthopedics;   Laterality: Left;  . INCISION AND DRAINAGE ABSCESS Left 06/24/2017   Procedure: INCISION AND DRAINAGE ABSCESS LEFT TIBIA AND APPLICATION OF WOUND VAC;  Surgeon: Renette Butters, MD;  Location: [place];  Service: Orthopedics;  Laterality: Left;  . IRRIGATION AND DEBRIDEMENT ABSCESS Left 02/15/2014   Procedure: IRRIGATION AND DRAINAGE OF LOW BACK ABSCESS;  Surgeon: Imogene Burn. Georgette Dover, MD;  Location: [place];  Service: General;  Laterality: Left;  . microdiskectomy  04/2001   Herniated disk L5-S1  . RENAL BIOPSY  04/2004  . REVISON OF ARTERIOVENOUS FISTULA Left 01/04/2017   Procedure: REVISON OF ARTERIOVENOUS FISTULA; CEPHALIC VEIN TURNDOWN;  Surgeon: Angelia Mould, MD;  Location: [place];  Service: Vascular;  Laterality: Left;  . REVISON OF ARTERIOVENOUS FISTULA Left 03/02/2018   Procedure: REVISION OF BRACHIOCEPHALIC ARTERIOVENOUS FISTULA LEFT UPPER ARM USING ARTEGRAFT;  Surgeon: Waynetta Sandy, MD;  Location: [place];  Service: Vascular;  Laterality: Left;  . SHUNTOGRAM  Dec. 18, 2013   Left arm  . SHUNTOGRAM N/A 10/18/2012   Procedure: Earney Mallet;  Surgeon: Conrad Pomeroy, MD;  Location: [facility] CATH LAB;  Service: Cardiovascular;  Laterality: N/A;  . SHUNTOGRAM Left 04/18/2013   Procedure: Earney Mallet;  Surgeon: Serafina Mitchell, MD;  Location: [facility] CATH LAB;  Service: Cardiovascular;  Laterality: Left;  . SPINE SURGERY  2004   Lumbar diskectomy   . UPPER GASTROINTESTINAL ENDOSCOPY    . WISDOM TOOTH EXTRACTION       OB History   No obstetric history on file.      Home Medications    Prior to Admission medications   Medication Sig Start Date End Date Taking? Authorizing Provider  acetaminophen (TYLENOL) 325 MG tablet Take 2 tablets (650 mg total) by mouth every 8 (eight) hours as needed for mild pain, fever or headache. 06/14/17   Everrett Coombe, MD  albuterol (PROVENTIL HFA;VENTOLIN HFA) 108 (90 Base) MCG/ACT inhaler Inhale 2 puffs into the lungs every 6 (six) hours as needed for shortness  of breath. 03/11/18   Danford, Suann Larry, MD  ALPRAZolam Duanne Moron) 1 MG tablet Take 1 mg by mouth 3 (three) times daily.    [provider]  amLODipine (NORVASC) 10 MG tablet Take 10 mg by mouth at bedtime.  02/08/14   [provider]  budesonide-formoterol (SYMBICORT) 160-4.5 MCG/ACT inhaler Inhale 2 puffs into the lungs 2 (two) times daily.    [provider]  buPROPion (WELLBUTRIN SR) 150 MG 12 hr tablet Take 150 mg by mouth daily. 04/15/17   [provider]  calcium acetate (PHOSLO) 667 MG capsule Take 2,001 mg by mouth 2 (two) times daily with a meal.    [provider]  cyclobenzaprine (FLEXERIL) 10 MG tablet Take 20 mg by  mouth at bedtime.     [provider]  HYDROmorphone (DILAUDID) 2 MG tablet Take 0.5 tablets (1 mg total) by mouth every 6 (six) hours as needed for moderate pain or severe pain. 06/28/17   Sherene Sires, DO  lidocaine-prilocaine (EMLA) cream Apply 1 application topically See admin instructions. Apply topically one hour prior to dialysis - Monday, Wednesday, Friday    [provider]  Melatonin 5 MG TABS Take 5 mg by mouth at bedtime.     [provider]  metoprolol tartrate (LOPRESSOR) 25 MG tablet Take 1 tablet (25 mg total) by mouth 2 (two) times daily. 06/14/17   Everrett Coombe, MD  montelukast (SINGULAIR) 10 MG tablet Take 10 mg by mouth daily.     [provider]  multivitamin (RENA-VIT) TABS tablet Take 1 tablet by mouth daily.    [provider]  nicotine (NICODERM CQ - DOSED IN MG/24 HOURS) 14 mg/24hr patch Place 1 patch (14 mg total) onto the skin daily. 06/28/17   Sherene Sires, DO  oxyCODONE ER (XTAMPZA ER) 36 MG C12A Take 36 mg by mouth 2 (two) times daily.    [provider]  sevelamer carbonate (RENVELA) 800 MG tablet Take 2,400 mg by mouth 2 (two) times daily with a meal.     [provider]  SUMAtriptan (IMITREX) 50 MG tablet Take 50 mg by mouth daily as needed  for migraine or headache.  02/14/14   [provider]    Family History Family History  Problem Relation Age of Onset  . Hypertension Mother   . Heart disease Father        Heart Disease before age 43  . Hypertension Father   . Heart attack Father   . Hypertension Daughter     Social History Social History   Tobacco Use  . Smoking status: Former Smoker    Packs/day: 0.50    Years: 18.00    Pack years: 9.00    Types: Cigarettes    Last attempt to quit: 10/01/2017    Years since quitting: 1.2  . Smokeless tobacco: Never Used  Substance Use Topics  . Alcohol use: Not Currently    Comment: occasional  . Drug use: No     Allergies   Nsaids; Tolmetin; Clindamycin/lincomycin; Demerol; Fentanyl; Other; Vancomycin; Clarithromycin; and Morphine and related   Review of Systems Review of Systems  Constitutional: Negative for fever.  Musculoskeletal: Positive for arthralgias.  Skin: Positive for wound.  Allergic/Immunologic: Positive for immunocompromised state.     Physical Exam Updated Vital Signs BP (!) 145/97   Pulse 72   Temp 98.4 F (36.9 C) (Oral)   Resp 19   LMP 06/27/2014   SpO2 100%   Physical Exam Vitals signs and nursing note reviewed.  Constitutional:      General: She is not in acute distress.    Appearance: Normal appearance. She is well-developed. She is ill-appearing (chronically).     Comments: Crying, tearful  HENT:     Head: Normocephalic and atraumatic.  Eyes:     General: No scleral icterus.       Right eye: No discharge.        Left eye: No discharge.     Conjunctiva/sclera: Conjunctivae normal.     Pupils: Pupils are equal, round, and reactive to light.  Neck:     Musculoskeletal: Normal range of motion.  Cardiovascular:     Rate and Rhythm: Normal rate and regular rhythm.  Pulmonary:  Effort: Pulmonary effort is normal. No respiratory distress.     Breath sounds: Normal breath sounds.  Abdominal:     General: There is  no distension.  Musculoskeletal:     Comments: Right hand: I&D wound over the lateral aspect of the hand with surrounding erythema. Significant tenderness to palpation. 2+ radial pulse  Skin:    General: Skin is warm and dry.  Neurological:     Mental Status: She is alert and oriented to person, place, and time.  Psychiatric:        Behavior: Behavior normal.        ED Treatments / Results  Labs (all labs ordered are listed, but only abnormal results are displayed) Labs Reviewed  COMPREHENSIVE METABOLIC PANEL - Abnormal; Notable for the following components:      Result Value   Chloride 94 (*)    BUN 43 (*)    Creatinine, Ser 7.55 (*)    Calcium 8.7 (*)    Total Protein 6.1 (*)    Albumin 3.2 (*)    GFR calc non Af Amer 6 (*)    GFR calc Af Amer 7 (*)    All other components within normal limits  CBC WITH DIFFERENTIAL/PLATELET - Abnormal; Notable for the following components:   RBC 2.89 (*)    Hemoglobin 9.4 (*)    HCT 28.9 (*)    RDW 16.5 (*)    All other components within normal limits  I-STAT BETA HCG BLOOD, ED (MC, WL, AP ONLY) - Abnormal; Notable for the following components:   I-stat hCG, quantitative 5.1 (*)    All other components within normal limits  CULTURE, BLOOD (ROUTINE X 2)  CULTURE, BLOOD (ROUTINE X 2)  LACTIC ACID, PLASMA  PROTIME-INR  VANCOMYCIN, RANDOM    EKG None  Radiology Dg Chest 2 View  Result Date: 01/11/2019 CLINICAL DATA:  Painful wound on the right wrist, possibly infected. Symptoms were since Monday. Dialysis patient. Smoker. EXAM: CHEST - 2 VIEW COMPARISON:  03/09/2018 FINDINGS: The heart size and mediastinal contours are within normal limits. Both lungs are clear. The visualized skeletal structures are unremarkable. Surgical clips in the left axilla. IMPRESSION: No active cardiopulmonary disease. Electronically Signed   By: Lucienne Capers M.D.   On: 01/11/2019 22:09   Dg Wrist Complete Right  Result Date: 01/11/2019 CLINICAL  DATA:  Painful wound on the right wrist, possibly infected. EXAM: RIGHT WRIST - COMPLETE 3+ VIEW COMPARISON:  01/08/2019 FINDINGS: Persistent soft tissue swelling and small soft tissue defect consistent with laceration along the radial side of the right wrist at the level of the proximal first metacarpal. Soft tissue swelling is less prominent than on previous study but the defect is new. No radiopaque foreign bodies are demonstrated. Underlying bones appear intact. No evidence of bone sclerosis or cortical loss to suggest osteomyelitis. No evidence of acute fracture or dislocation. Benign-appearing cysts in the capitate, likely bone cysts. IMPRESSION: Soft tissue swelling and laceration along the radial side of the right wrist. No acute bony abnormalities. Electronically Signed   By: Lucienne Capers M.D.   On: 01/11/2019 22:11    Procedures Procedures (including critical care time)  Medications Ordered in ED Medications  sodium chloride flush (NS) 0.9 % injection 3 mL (3 mLs Intravenous Given 01/11/19 2242)  HYDROmorphone (DILAUDID) injection 1 mg (1 mg Intravenous Given 01/11/19 2239)  doxycycline (VIBRA-TABS) tablet 100 mg (100 mg Oral Given 01/12/19 0030)  oxyCODONE-acetaminophen (PERCOCET/ROXICET) 5-325 MG per tablet 2 tablet (2  tablets Oral Given 01/12/19 0030)     Initial Impression / Assessment and Plan / ED Course  I have reviewed the triage vital signs and the nursing notes.  Pertinent labs & imaging results that were available during my care of the patient were reviewed by me and considered in my medical decision making (see chart for details).  48 year old female with wound infection of the R hand. BP is elevated here but she is afebrile and not tachycardic. Wound appears infected but she is not taking abx. Culture grew out MRSA which is sensitive to Doxy. Labs are at baseline and she was dialyzed today. No leukocytosis. Xray of the hand is negative for any bony abnormality and CXR does  not show any volume overload. Shared visit with Dr. Gilford Raid who recommended admission. Discussed with Dr. Myna Hidalgo who declines admission. Discussed with pt. She is agreeable to outpatient tx. She was given a dose of pain medicine and Doxy here and rx for the same. She will go to dialysis tomorrow. Return if worsening  Final Clinical Impressions(s) / ED Diagnoses   Final diagnoses:  MRSA (methicillin resistant staph aureus) culture positive  Cellulitis of hand, right    ED Discharge Orders    None       Chrysa, Rampy 01/12/19 2117    Isla Pence, MD 01/15/19 [phone]

## 2019-01-11 NOTE — ED Triage Notes (Signed)
Pt here for right hand pain.  Pt states had abscess drained there Monday and now pain is worse.  Wrist is swollen and red.  Pus like oozing from wound.  Last dressing changed last night. A&Ox4, tearful in triage. On dialysis with last tx last Friday, unable to have tx due to wrist pain.

## 2019-01-12 ENCOUNTER — Telehealth: Payer: Self-pay | Admitting: Emergency Medicine

## 2019-01-12 ENCOUNTER — Telehealth: Payer: Self-pay | Admitting: *Deleted

## 2019-01-12 DIAGNOSIS — N186 End stage renal disease: Secondary | ICD-10-CM | POA: Diagnosis not present

## 2019-01-12 DIAGNOSIS — N2581 Secondary hyperparathyroidism of renal origin: Secondary | ICD-10-CM | POA: Diagnosis not present

## 2019-01-12 DIAGNOSIS — E119 Type 2 diabetes mellitus without complications: Secondary | ICD-10-CM | POA: Diagnosis not present

## 2019-01-12 DIAGNOSIS — L03119 Cellulitis of unspecified part of limb: Secondary | ICD-10-CM | POA: Diagnosis not present

## 2019-01-12 DIAGNOSIS — A4902 Methicillin resistant Staphylococcus aureus infection, unspecified site: Secondary | ICD-10-CM | POA: Diagnosis not present

## 2019-01-12 DIAGNOSIS — S61401D Unspecified open wound of right hand, subsequent encounter: Secondary | ICD-10-CM | POA: Diagnosis not present

## 2019-01-12 LAB — VANCOMYCIN, RANDOM

## 2019-01-12 MED ORDER — OXYCODONE-ACETAMINOPHEN 5-325 MG PO TABS
2.0000 | ORAL_TABLET | Freq: Once | ORAL | Status: AC
  Administered 2019-01-12: 2 via ORAL
  Filled 2019-01-12: qty 2

## 2019-01-12 MED ORDER — DOXYCYCLINE HYCLATE 100 MG PO TABS
100.0000 mg | ORAL_TABLET | Freq: Once | ORAL | Status: AC
  Administered 2019-01-12: 100 mg via ORAL
  Filled 2019-01-12: qty 1

## 2019-01-12 MED ORDER — DOXYCYCLINE HYCLATE 100 MG PO CAPS: 100.0000 mg | ORAL_CAPSULE | Freq: Two times a day (BID) | ORAL | 0 refills | Status: DC

## 2019-01-12 MED ORDER — OXYCODONE-ACETAMINOPHEN 5-325 MG PO TABS: 1.0000 | ORAL_TABLET | ORAL | 0 refills | Status: DC | PRN

## 2019-01-12 NOTE — Progress Notes (Addendum)
Pharmacy Antibiotic Note  [name] is a 48 y.o. female admitted on 01/11/2019 with cellulitis/wound infection.  Pharmacy has been consulted for Vancomycin dosing. WBC 8.9. ESRD on HD MWF.   Noted vancomycin on allergy list, likely red-man type reaction. Pt tolerated vancomycin in May 2019.  Plan: Pt has been taking anti-biotics at HD Will clarify which anti-biotic  Will check a random vancomycin level if she is getting vancomycin  Temp (24hrs), Avg:98.4 F (36.9 C), Min:98.4 F (36.9 C), Max:98.4 F (36.9 C)  Recent Labs  Lab 01/08/19 1210 01/08/19 1211 01/08/19 1651 01/11/19 2129  WBC  --  15.8*  --  8.9  CREATININE  --  10.16*  --  7.55*  LATICACIDVEN 0.9  --  0.6 0.6    Estimated Creatinine Clearance: 8 mL/min (A) (by C-G formula based on SCr of 7.55 mg/dL (H)).    Allergies  Allergen Reactions  . Nsaids Other (See Comments)    Renal failure  . Tolmetin Other (See Comments)    Renal failure  . Clindamycin/Lincomycin Hives and Rash  . Demerol Nausea And Vomiting and Other (See Comments)    Severe headaches  . Fentanyl Hives, Nausea And Vomiting, Rash and Other (See Comments)    Reaction to patches only  . Other Other (See Comments)    Seeds and nuts- has diverticulitis   . Vancomycin Hives and Other (See Comments)    ? 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  . Clarithromycin Nausea And Vomiting  . Morphine And Related Nausea And Vomiting and Other (See Comments)    Severe headache, Pt does not want it!    Narda Bonds 01/12/2019 12:18 AM

## 2019-01-12 NOTE — Telephone Encounter (Signed)
Post ED Visit - Positive Culture Follow-up  Culture report reviewed by antimicrobial stewardship pharmacist: [name] []  [name], Pharm.D. []  [name], Pharm.D., BCPS AQ-ID []  [name], Pharm.D., BCPS []  [name], Pharm.D., BCPS []  [name], Pharm.D., BCPS, AAHIVP []  [name], Pharm.D., BCPS, AAHIVP []  [name], PharmD, BCPS []  [name], PharmD, BCPS [x]  [name], PharmD, BCPS []  [name], PharmD []  [name], PharmD, BCPS []  [name], PharmD  [name] []  [name], PharmD []  [name], PharmD []  [name], PharmD []  [name], Rph []  [name], PharmD []  [name], PharmD []  [name], PharmD []  [name], PharmD []  [name], PharmD []  [name], PharmD []  [name], PharmD []  [name], PharmD []  [name], PharmD   Positive aerobic culture Treated with Doxycycline, organism sensitive to the same and no further patient follow-up is required at this time.  [name] 01/12/2019, 11:33 AM

## 2019-01-12 NOTE — Telephone Encounter (Signed)
Pt and husband called regarding Rx sent to incorrect pharmacy.  EDCM reviewed chart to find that Rx includes narcotic.  EDCM informed that changing pharmacy would have to come from prescribing EDP  Since it includes a narcotic.  EDCM left in-basket message for prescribing EDP.

## 2019-01-12 NOTE — ED Notes (Signed)
Patient verbalizes understanding of discharge instructions. Opportunity for questioning and answers were provided. Armband removed by staff, pt discharged from ED in wheelchair.  

## 2019-01-12 NOTE — Discharge Instructions (Signed)
Take Doxycycline twice a day for one week. Keep wound clean and dry Take pain medicine as prescribed for break through pain Return if worsening

## 2019-01-16 LAB — CULTURE, BLOOD (ROUTINE X 2)
Culture: NO GROWTH
Culture: NO GROWTH
Special Requests: ADEQUATE
Special Requests: ADEQUATE

## 2019-01-17 ENCOUNTER — Emergency Department (HOSPITAL_COMMUNITY)
Admission: EM | Admit: 2019-01-17 | Discharge: 2019-01-17 | Disposition: A | Discharge status: Home / Self Care | Payer: Medicare Other | Attending: Emergency Medicine | Admitting: Emergency Medicine

## 2019-01-17 ENCOUNTER — Emergency Department (HOSPITAL_COMMUNITY): Payer: Medicare Other

## 2019-01-17 ENCOUNTER — Encounter (HOSPITAL_COMMUNITY): Payer: Self-pay

## 2019-01-17 DIAGNOSIS — N186 End stage renal disease: Secondary | ICD-10-CM | POA: Diagnosis not present

## 2019-01-17 DIAGNOSIS — Z992 Dependence on renal dialysis: Secondary | ICD-10-CM | POA: Insufficient documentation

## 2019-01-17 DIAGNOSIS — L03119 Cellulitis of unspecified part of limb: Secondary | ICD-10-CM | POA: Diagnosis not present

## 2019-01-17 DIAGNOSIS — Z87891 Personal history of nicotine dependence: Secondary | ICD-10-CM | POA: Insufficient documentation

## 2019-01-17 DIAGNOSIS — J45909 Unspecified asthma, uncomplicated: Secondary | ICD-10-CM | POA: Diagnosis not present

## 2019-01-17 DIAGNOSIS — A4902 Methicillin resistant Staphylococcus aureus infection, unspecified site: Secondary | ICD-10-CM | POA: Diagnosis not present

## 2019-01-17 DIAGNOSIS — I120 Hypertensive chronic kidney disease with stage 5 chronic kidney disease or end stage renal disease: Secondary | ICD-10-CM | POA: Diagnosis not present

## 2019-01-17 DIAGNOSIS — Z79899 Other long term (current) drug therapy: Secondary | ICD-10-CM | POA: Diagnosis not present

## 2019-01-17 DIAGNOSIS — N2581 Secondary hyperparathyroidism of renal origin: Secondary | ICD-10-CM | POA: Diagnosis not present

## 2019-01-17 DIAGNOSIS — S61401D Unspecified open wound of right hand, subsequent encounter: Secondary | ICD-10-CM | POA: Diagnosis not present

## 2019-01-17 DIAGNOSIS — R5383 Other fatigue: Secondary | ICD-10-CM | POA: Diagnosis not present

## 2019-01-17 DIAGNOSIS — E119 Type 2 diabetes mellitus without complications: Secondary | ICD-10-CM | POA: Diagnosis not present

## 2019-01-17 DIAGNOSIS — R4182 Altered mental status, unspecified: Secondary | ICD-10-CM | POA: Diagnosis not present

## 2019-01-17 LAB — COMPREHENSIVE METABOLIC PANEL
ALT: 6 U/L (ref 0–44)
ANION GAP: 12 (ref 5–15)
AST: 19 U/L (ref 15–41)
Albumin: 2.9 g/dL — ABNORMAL LOW (ref 3.5–5.0)
Alkaline Phosphatase: 58 U/L (ref 38–126)
BUN: 36 mg/dL — ABNORMAL HIGH (ref 6–20)
CALCIUM: 8.3 mg/dL — AB (ref 8.9–10.3)
CO2: 29 mmol/L (ref 22–32)
Chloride: 98 mmol/L (ref 98–111)
Creatinine, Ser: 6.53 mg/dL — ABNORMAL HIGH (ref 0.44–1.00)
GFR calc Af Amer: 8 mL/min — ABNORMAL LOW (ref >60)
GFR calc non Af Amer: 7 mL/min — ABNORMAL LOW (ref >60)
Glucose, Bld: 89 mg/dL (ref 70–99)
Potassium: 3.8 mmol/L (ref 3.5–5.1)
Sodium: 139 mmol/L (ref 135–145)
Total Bilirubin: 0.9 mg/dL (ref 0.3–1.2)
Total Protein: 5.6 g/dL — ABNORMAL LOW (ref 6.5–8.1)

## 2019-01-17 LAB — URINALYSIS, ROUTINE W REFLEX MICROSCOPIC
Bilirubin Urine: NEGATIVE
Glucose, UA: NEGATIVE mg/dL
Hgb urine dipstick: NEGATIVE
Ketones, ur: NEGATIVE mg/dL
Leukocytes,Ua: NEGATIVE
Nitrite: NEGATIVE
Protein, ur: 100 mg/dL — AB
SPECIFIC GRAVITY, URINE: 1.008 (ref 1.005–1.030)
pH: 8 (ref 5.0–8.0)

## 2019-01-17 LAB — RAPID URINE DRUG SCREEN, HOSP PERFORMED
Amphetamines: NOT DETECTED
Barbiturates: NOT DETECTED
Benzodiazepines: NOT DETECTED
COCAINE: NOT DETECTED
Opiates: POSITIVE — AB
Tetrahydrocannabinol: NOT DETECTED

## 2019-01-17 LAB — CBC WITH DIFFERENTIAL/PLATELET
Abs Immature Granulocytes: 0.04 10*3/uL (ref 0.00–0.07)
BASOS ABS: 0 10*3/uL (ref 0.0–0.1)
Basophils Relative: 0 %
Eosinophils Absolute: 0 10*3/uL (ref 0.0–0.5)
Eosinophils Relative: 0 %
HEMATOCRIT: 26.8 % — AB (ref 36.0–46.0)
Hemoglobin: 8.2 g/dL — ABNORMAL LOW (ref 12.0–15.0)
Immature Granulocytes: 1 %
LYMPHS ABS: 1.7 10*3/uL (ref 0.7–4.0)
LYMPHS PCT: 21 %
MCH: 31.7 pg (ref 26.0–34.0)
MCHC: 30.6 g/dL (ref 30.0–36.0)
MCV: 103.5 fL — ABNORMAL HIGH (ref 80.0–100.0)
Monocytes Absolute: 0.5 10*3/uL (ref 0.1–1.0)
Monocytes Relative: 6 %
NRBC: 0 % (ref 0.0–0.2)
Neutro Abs: 5.8 10*3/uL (ref 1.7–7.7)
Neutrophils Relative %: 72 %
Platelets: 173 10*3/uL (ref 150–400)
RBC: 2.59 MIL/uL — AB (ref 3.87–5.11)
RDW: 15.8 % — ABNORMAL HIGH (ref 11.5–15.5)
WBC: 8.1 10*3/uL (ref 4.0–10.5)

## 2019-01-17 LAB — APTT: aPTT: 29 seconds (ref 24–36)

## 2019-01-17 LAB — CBG MONITORING, ED: Glucose-Capillary: 89 mg/dL (ref 70–99)

## 2019-01-17 LAB — ETHANOL: Alcohol, Ethyl (B): <10 mg/dL (ref <10)

## 2019-01-17 LAB — PROTIME-INR
INR: 0.9 (ref 0.8–1.2)
Prothrombin Time: 12.2 seconds (ref 11.4–15.2)

## 2019-01-17 LAB — AMMONIA: Ammonia: 27 umol/L (ref 9–35)

## 2019-01-17 LAB — LACTIC ACID, PLASMA: Lactic Acid, Venous: 0.5 mmol/L (ref 0.5–1.9)

## 2019-01-17 MED ORDER — VANCOMYCIN HCL IN DEXTROSE 750-5 MG/150ML-% IV SOLN: 750.0000 mg | INTRAVENOUS | Status: DC

## 2019-01-17 MED ORDER — VANCOMYCIN HCL 10 G IV SOLR
1500.0000 mg | Freq: Once | INTRAVENOUS | Status: DC
  Administered 2019-01-17: 1500 mg via INTRAVENOUS
  Filled 2019-01-17: qty 1500

## 2019-01-17 MED ORDER — SODIUM CHLORIDE 0.9 % IV BOLUS
500.0000 mL | Freq: Once | INTRAVENOUS | Status: AC
  Administered 2019-01-17: 500 mL via INTRAVENOUS

## 2019-01-17 NOTE — Discharge Instructions (Addendum)
You have been diagnosed today with fatigue.  At this time there does not appear to be the presence of an emergent medical condition, however there is always the potential for conditions to change. Please read and follow the below instructions.  Please return to the Emergency Department immediately for any new or worsening symptoms. Please be sure to follow up with your Primary Care Provider within one week regarding your visit today; please call their office to schedule an appointment even if you are feeling better for a follow-up visit. Please be sure to contact your primary care provider as well as your nephrologist regarding your visit today.  Call them tomorrow morning to schedule follow-up appointments. Please attempt to go to your dialysis as scheduled and not miss treatments.  Additionally speaking to a counselor may help with stress-levels.  Get help right away if: You feel confused. Your vision is blurry. You feel faint or you pass out. You have a severe headache. You have severe pain in your abdomen, your back, or the area between your waist and hips (pelvis). You have chest pain, shortness of breath, or an irregular or fast heartbeat. You are unable to urinate, or you urinate less than normal. You have abnormal bleeding, such as bleeding from the rectum, vagina, nose, lungs, or nipples. You vomit blood. You have thoughts about hurting yourself or others. Any new or concerning symptoms  Please read the additional information packets attached to your discharge summary.

## 2019-01-17 NOTE — ED Triage Notes (Signed)
Per EMS pt arrived to dialysis and was "not herself". Pt lethargic and non vocal. Only nodding to yes or no questions. Was able to complete dialysis today. Staff states that pt in MWF dialysis days but last dialysis was 01/11/19. Denies pain or discomfort

## 2019-01-17 NOTE — ED Notes (Signed)
Called EDPA to see if he wanted second lactic on pt. EDPA said no

## 2019-01-17 NOTE — Progress Notes (Signed)
Pharmacy Antibiotic Note  [name] is a 48 y.o. female admitted on 01/17/2019 with cellulitis.  Pharmacy has been consulted for vancomycin dosing. Pt is afebrile and WBC is WNL. Pt with history of ESRD on HD. Of note, pt with documented allergy to vancomycin but she has tolerated recently.   Plan: Vancomycin 1500mg  IV x 1 then 750mg  post-HD F/u renal plans, C&S, clinical status and pre-HD vanc level when appropriate     Temp (24hrs), Avg:98.8 F (37.1 C), Min:98.8 F (37.1 C), Max:98.8 F (37.1 C)  Recent Labs  Lab 01/11/19 2129 01/12/19 0043 01/17/19 1316  WBC 8.9  --  8.1  CREATININE 7.55*  --  6.53*  LATICACIDVEN 0.6  --  0.5  VANCORANDOM  --  <4  --     Estimated Creatinine Clearance: 9.3 mL/min (A) (by C-G formula based on SCr of 6.53 mg/dL (H)).    Allergies  Allergen Reactions  . Nsaids Other (See Comments)    Renal failure  . Tolmetin Other (See Comments)    Renal failure  . Clindamycin/Lincomycin Hives and Rash  . Demerol Nausea And Vomiting and Other (See Comments)    Severe headaches  . Fentanyl Hives, Nausea And Vomiting, Rash and Other (See Comments)    Reaction to patches only  . Other Other (See Comments)    Seeds and nuts- has diverticulitis   . Vancomycin Hives and Other (See Comments)    ? 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  . Clarithromycin Nausea And Vomiting  . Morphine And Related Nausea And Vomiting and Other (See Comments)    Severe headache, Pt does not want it!    Antimicrobials this admission: Vanc 3/18>>  Dose adjustments this admission: N/A  Microbiology results: Pending  Thank you for allowing pharmacy to be a part of this patient's care.  Norwood Quezada, Rande Lawman 01/17/2019 2:22 PM

## 2019-01-17 NOTE — ED Notes (Signed)
Urine culture sent to lab.

## 2019-01-17 NOTE — ED Provider Notes (Signed)
  Face-to-face evaluation   History: Patient was noted to be "not herself at dialysis today however they were able to continue and complete her dialysis.  Patient is currently somewhat disoriented and cannot remember that she had dialysis today until I reminded her.  She denies use of alcohol or drugs at home.  She denies problems at home.  She is being actively treated for infection in the right hand with daptomycin, at dialysis.  Physical exam: Disheveled patient appears older than her stated age.  Heart regular rate and rhythm without murmur lungs clear anteriorly.  Abdomen soft nontender.  Right hand wound, healing with mild erythema associated no active drainage or discharge.  Fair range of motion right hand and wrist.  1555-I discussed situation with patient's husband who is in the room with her.  He states that the dialysis unit was worried that she was "shaking."  Currently started shaking 2 days ago when she was traveling with some relatives, during which time she missed several dialysis treatments.  Patient admits that she is upset about her ongoing dialysis treatment and her chronic kidney disease.  She began to cry while we are talking about that.  I encouraged patient and her husband to help her get some counseling.  Medical screening examination/treatment/procedure(s) were conducted as a shared visit with non-physician practitioner(s) and myself.  I personally evaluated the patient during the encounter    Daleen Bo, MD 01/17/19 1731

## 2019-01-17 NOTE — ED Provider Notes (Addendum)
[facility] EMERGENCY DEPARTMENT Provider Note   CSN: [id number] Arrival date & time: 01/17/19  1249    History   Chief Complaint Chief Complaint  Patient presents with  . Altered Mental Status    HPI [name] is a 48 y.o. female presenting today for altered mental status.  Patient lethargic on arrival, will arouse to loud voice and touch however quickly goes back to sleep.  During these brief periods she denies any and all pain and states that she "does not feel well".  Patient denies recent fever or illness.  Per nursing note patient arrives from dialysis for not acting herself today.  Patient did complete dialysis today.  Monday Wednesday Friday dialysis.  Approximately 6 days since previous dialysis session prior to today.  Of note patient with cellulitis of the right hand, recently seen here and prescribed doxycycline, she does report that she has been compliant with this medication.     HPI  Past Medical History:  Diagnosis Date  . Abscess of tendon sheath, left ankle and foot 02/14/2014  . ADD (attention deficit disorder)   . Anemia   . Anemia   . Anxiety   . Arthritis    knee, wrist, back   . Asthma   . Blood transfusion 06/2011   6 units transfused at [facility]  . Bronchitis   . Depression   . Diverticulitis   . Dizziness   . ESRD (end stage renal disease) ([place])    Hemodialysis 3 times a  week - [place]  . Gastritis   . GERD (gastroesophageal reflux disease)   . Gout   . Headache(784.0)    migraines  . Hypertension    sees Dr. Wendie Agreste  . Neuromuscular disorder (HCC)    carpal tunnel  . Obesity   . Pneumonia   . PONV (postoperative nausea and vomiting)   . Reflux   . Renal insufficiency   . Shortness of breath   . Sleep apnea    does not use CPAP    Patient Active Problem List   Diagnosis Date Noted  . HCAP (healthcare-associated pneumonia) 03/09/2018  . Left upper extremity swelling 03/09/2018  . Cellulitis  06/23/2017  . Chronic pain syndrome   . Cellulitis of left lower extremity   . Tenosynovitis of ankle   . Heart murmur   . Sepsis ([place]) 02/15/2014  . Abscess of tendon sheath, left ankle and foot 02/14/2014  . Thinning of skin 04/09/2013  . Mechanical complication of other vascular device, implant, and graft 04/09/2013  . Pain, limb, left 10/18/2012  . Swollen 10/18/2012  . Status post endometrial ablation 12/10/2011  . ESRD on dialysis ([place]) 11/24/2011  . Renal failure (ARF), acute on chronic (HCC) 10/19/2011  . HTN (hypertension) 10/19/2011  . Anemia 10/19/2011    Past Surgical History:  Procedure Laterality Date  . APPLICATION OF WOUND VAC Left 06/24/2017   Procedure: APPLICATION OF WOUND VAC;  Surgeon: Renette Butters, MD;  Location: [facility];  Service: Orthopedics;  Laterality: Left;  . AV FISTULA PLACEMENT  06/10/11   Left brachiocephalic AVF  . AV Fistula surgery  11/30/11   [facility] - redo fistula  . Carpel tunnel release     x 2; bilateral  . CESAREAN SECTION    . colonscopy    . DILATION AND CURETTAGE OF UTERUS    . HYSTEROSCOPY WITH THERMACHOICE  12/10/2011   Procedure: HYSTEROSCOPY WITH THERMACHOICE;  Surgeon: Betsy Coder, MD;  Location: [facility];  Service: Gynecology;  Laterality: N/A;  . I&D EXTREMITY Left 06/12/2017   Procedure: IRRIGATION AND DEBRIDEMENT FOOT;  Surgeon: Marchia Bond, MD;  Location: [facility];  Service: Orthopedics;  Laterality: Left;  . I&D EXTREMITY Left 06/14/2017   Procedure: IRRIGATION AND DEBRIDEMENT EXTREMITY/WOUND CLOSURE;  Surgeon: Renette Butters, MD;  Location: [place];  Service: Orthopedics;  Laterality: Left;  . I&D EXTREMITY Left 06/27/2017   Procedure: IRRIGATION AND DEBRIDEMENT LEFT TIBIA;  Surgeon: Renette Butters, MD;  Location: [facility];  Service: Orthopedics;  Laterality: Left;  . INCISION AND DRAINAGE ABSCESS Left 06/24/2017   Procedure: INCISION AND DRAINAGE ABSCESS LEFT TIBIA AND APPLICATION OF WOUND VAC;  Surgeon: Renette Butters, MD;  Location: [facility];  Service: Orthopedics;  Laterality: Left;  . IRRIGATION AND DEBRIDEMENT ABSCESS Left 02/15/2014   Procedure: IRRIGATION AND DRAINAGE OF LOW BACK ABSCESS;  Surgeon: Imogene Burn. Georgette Dover, MD;  Location: [facility];  Service: General;  Laterality: Left;  . microdiskectomy  04/2001   Herniated disk L5-S1  . RENAL BIOPSY  04/2004  . REVISON OF ARTERIOVENOUS FISTULA Left 01/04/2017   Procedure: REVISON OF ARTERIOVENOUS FISTULA; CEPHALIC VEIN TURNDOWN;  Surgeon: Angelia Mould, MD;  Location: [facility];  Service: Vascular;  Laterality: Left;  . REVISON OF ARTERIOVENOUS FISTULA Left 03/02/2018   Procedure: REVISION OF BRACHIOCEPHALIC ARTERIOVENOUS FISTULA LEFT UPPER ARM USING ARTEGRAFT;  Surgeon: Waynetta Sandy, MD;  Location: [facility];  Service: Vascular;  Laterality: Left;  . SHUNTOGRAM  Dec. 18, 2013   Left arm  . SHUNTOGRAM N/A 10/18/2012   Procedure: Earney Mallet;  Surgeon: Conrad Martin, MD;  Location: [facility] C[facility] LAB;  Service: Cardiovascular;  Laterality: N/A;  . SHUNTOGRAM Left 04/18/2013   Procedure: Earney Mallet;  Surgeon: Serafina Mitchell, MD;  Location: [facility] CATH LAB;  Service: Cardiovascular;  Laterality: Left;  . SPINE SURGERY  2004   Lumbar diskectomy   . UPPER GASTROINTESTINAL ENDOSCOPY    . WISDOM TOOTH EXTRACTION       OB History   No obstetric history on file.      Home Medications    Prior to Admission medications   Medication Sig Start Date End Date Taking? Authorizing Provider  acetaminophen (TYLENOL) 325 MG tablet Take 2 tablets (650 mg total) by mouth every 8 (eight) hours as needed for mild pain, fever or headache. 06/14/17   Everrett Coombe, MD  albuterol (PROVENTIL HFA;VENTOLIN HFA) 108 (90 Base) MCG/ACT inhaler Inhale 2 puffs into the lungs every 6 (six) hours as needed for shortness of breath. 03/11/18   Danford, Suann Larry, MD  ALPRAZolam Duanne Moron) 1 MG tablet Take 1 mg by mouth 3 (three) times daily.    [provider]  amLODipine (NORVASC) 10 MG  tablet Take 10 mg by mouth at bedtime.  02/08/14   [provider]  budesonide-formoterol (SYMBICORT) 160-4.5 MCG/ACT inhaler Inhale 2 puffs into the lungs 2 (two) times daily.    [provider]  buPROPion (WELLBUTRIN SR) 150 MG 12 hr tablet Take 150 mg by mouth daily. 04/15/17   [provider]  calcium acetate (PHOSLO) 667 MG capsule Take 1,334-2,001 mg by mouth daily as needed (when eating). 3 capsules with meals and 2 capsules with snacks    [provider]  cyclobenzaprine (FLEXERIL) 10 MG tablet Take 20 mg by mouth at bedtime.     [provider]  doxycycline (VIBRAMYCIN) 100 MG capsule Take 1 capsule (100 mg total) by mouth 2 (  two) times daily. 01/12/19   Recardo Evangelist, PA-C  furosemide (LASIX) 80 MG tablet Take 160 mg by mouth daily. 12/04/18   [provider]  hydrALAZINE (APRESOLINE) 50 MG tablet Take 50 mg by mouth 3 (three) times daily. 12/04/18   [provider]  HYDROmorphone (DILAUDID) 2 MG tablet Take 0.5 tablets (1 mg total) by mouth every 6 (six) hours as needed for moderate pain or severe pain. 06/28/17   Sherene Sires, DO  lidocaine-prilocaine (EMLA) cream Apply 1 application topically See admin instructions. Apply topically one hour prior to dialysis - Monday, Wednesday, Friday    [provider]  lisinopril (PRINIVIL,ZESTRIL) 40 MG tablet Take 40 mg by mouth daily. 12/04/18   [provider]  Melatonin 5 MG TABS Take 5 mg by mouth at bedtime.     [provider]  methocarbamol (ROBAXIN) 500 MG tablet Take 500 mg by mouth 2 (two) times daily. 12/29/18   [provider]  metoprolol tartrate (LOPRESSOR) 25 MG tablet Take 1 tablet (25 mg total) by mouth 2 (two) times daily. 06/14/17   Everrett Coombe, MD  montelukast (SINGULAIR) 10 MG tablet Take 10 mg by mouth daily.     [provider]  multivitamin (RENA-VIT) TABS tablet Take 1 tablet by mouth daily.    [provider]   naloxone [facility]) nasal spray 4 mg/0.1 mL Place 1 spray into the nose daily as needed (overdose).    [provider]  nicotine (NICODERM CQ - DOSED IN MG/24 HOURS) 14 mg/24hr patch Place 1 patch (14 mg total) onto the skin daily. 06/28/17   Sherene Sires, DO  oxyCODONE ER (XTAMPZA ER) 36 MG C12A Take 36 mg by mouth 2 (two) times daily.    [provider]  oxyCODONE-acetaminophen (PERCOCET/ROXICET) 5-325 MG tablet Take 1 tablet by mouth every 4 (four) hours as needed for severe pain. 01/12/19   Recardo Evangelist, PA-C  sevelamer carbonate (RENVELA) 800 MG tablet Take 1,600-2,400 mg by mouth daily as needed (when eating). Take 3 tablets with meals and 2 tablets with snacks    [provider]  SUMAtriptan (IMITREX) 50 MG tablet Take 50 mg by mouth daily as needed for migraine or headache.  02/14/14   [provider]  topiramate (TOPAMAX) 100 MG tablet Take 100 mg by mouth every morning. 12/01/18   [provider]    Family History Family History  Problem Relation Age of Onset  . Hypertension Mother   . Heart disease Father        Heart Disease before age 55  . Hypertension Father   . Heart attack Father   . Hypertension Daughter     Social History Social History   Tobacco Use  . Smoking status: Former Smoker    Packs/day: 0.50    Years: 18.00    Pack years: 9.00    Types: Cigarettes    Last attempt to quit: 10/01/2017    Years since quitting: 1.2  . Smokeless tobacco: Never Used  Substance Use Topics  . Alcohol use: Not Currently    Comment: occasional  . Drug use: No     Allergies   Nsaids; Tolmetin; Clindamycin/lincomycin; Demerol; Fentanyl; Other; Vancomycin; Clarithromycin; and Morphine and related   Review of Systems Review of Systems  Unable to perform ROS: Mental status change   Physical Exam Updated Vital Signs BP (!) 172/97   Pulse 72   Temp 98.9 F (37.2 C) (Oral)   Resp 13  LMP 06/27/2014   SpO2 97%   Physical  Exam Constitutional:      General: She is not in acute distress.    Appearance: Normal appearance. She is well-developed. She is not ill-appearing or diaphoretic.  HENT:     Head: Normocephalic and atraumatic. No raccoon eyes or Battle's sign.     Jaw: There is normal jaw occlusion.     Right Ear: Tympanic membrane, ear canal and external ear normal. No hemotympanum.     Left Ear: Tympanic membrane, ear canal and external ear normal. No hemotympanum.     Nose: Nose normal.  Eyes:     General: Vision grossly intact. Gaze aligned appropriately.     Extraocular Movements: Extraocular movements intact.     Pupils: Pupils are equal, round, and reactive to light.  Neck:     Musculoskeletal: Normal range of motion and neck supple.     Trachea: Trachea and phonation normal. No tracheal tenderness or tracheal deviation.  Cardiovascular:     Rate and Rhythm: Normal rate and regular rhythm.     Pulses:          Dorsalis pedis pulses are 2+ on the right side and 2+ on the left side.       Posterior tibial pulses are 2+ on the right side and 2+ on the left side.     Heart sounds: Normal heart sounds.  Pulmonary:     Effort: Pulmonary effort is normal. No accessory muscle usage or respiratory distress.     Breath sounds: Normal breath sounds and air entry.  Abdominal:     General: There is no distension.     Palpations: Abdomen is soft.     Tenderness: There is no abdominal tenderness. There is no guarding or rebound.  Musculoskeletal: Normal range of motion.     Right lower leg: Normal. She exhibits no tenderness. No edema.     Left lower leg: Normal. She exhibits no tenderness. No edema.     Comments: No midline C/T/L spinal tenderness to palpation, no paraspinal muscle tenderness, no deformity, crepitus, or step-off noted. No sign of injury to the neck or back.  No pain with palpation of the hips bilaterally.  Patient able bring knees to chest bilaterally without pain.  She is able to pull  herself up to a seated position without difficulty. - Patient with laceration and cellulitis to the right thumb base, appears to be healing compared to previous ED picture.  Feet:     Right foot:     Protective Sensation: 5 sites tested. 5 sites sensed.     Left foot:     Protective Sensation: 5 sites tested. 5 sites sensed.  Skin:    General: Skin is warm and dry.     Capillary Refill: Capillary refill takes less than 2 seconds.  Neurological:     General: No focal deficit present.     Mental Status: She is lethargic.     GCS: GCS eye subscore is 4. GCS verbal subscore is 5. GCS motor subscore is 6.     Comments: Mental Status: Alert but slightly lethargic , oriented, thought content appropriate, able to give a coherent history. Speech fluent without evidence of aphasia. Able to follow 2 step commands without difficulty. Cranial Nerves: II: Peripheral visual fields grossly normal, pupils equal, round, reactive to light III,IV, VI: ptosis not present, extra-ocular motions intact bilaterally V,VII: smile symmetric, eyebrows raise symmetric, facial light touch sensation equal VIII: hearing grossly  normal to voice X: uvula elevates symmetrically XI: bilateral shoulder shrug symmetric and strong XII: midline tongue extension without fassiculations Motor: Normal tone. 5/5 strength in upper and lower extremities bilaterally including strong and equal grip strength and dorsiflexion/plantar flexion Sensory: Sensation intact to light touch in all extremities. Deep Tendon Reflexes: 2+ and symmetric patella Cerebellar: normal finger-to-nose with bilateral upper extremities. No pronator drift.  CV: distal pulses palpable throughout Gait: Patient ambulated around room with me without assistance or difficulty.  Psychiatric:        Behavior: Behavior normal.    ED Treatments / Results  Labs (all labs ordered are listed, but only abnormal results are displayed) Labs Reviewed  CBC WITH  DIFFERENTIAL/PLATELET - Abnormal; Notable for the following components:      Result Value   RBC 2.59 (*)    Hemoglobin 8.2 (*)    HCT 26.8 (*)    MCV 103.5 (*)    RDW 15.8 (*)    All other components within normal limits  COMPREHENSIVE METABOLIC PANEL - Abnormal; Notable for the following components:   BUN 36 (*)    Creatinine, Ser 6.53 (*)    Calcium 8.3 (*)    Total Protein 5.6 (*)    Albumin 2.9 (*)    GFR calc non Af Amer 7 (*)    GFR calc Af Amer 8 (*)    All other components within normal limits  URINALYSIS, ROUTINE W REFLEX MICROSCOPIC - Abnormal; Notable for the following components:   Protein, ur 100 (*)    Bacteria, UA RARE (*)    All other components within normal limits  RAPID URINE DRUG SCREEN, HOSP PERFORMED - Abnormal; Notable for the following components:   Opiates POSITIVE (*)    All other components within normal limits  CULTURE, BLOOD (ROUTINE X 2)  CULTURE, BLOOD (ROUTINE X 2)  LACTIC ACID, PLASMA  AMMONIA  ETHANOL  PROTIME-INR  APTT  CBG MONITORING, ED  CBG MONITORING, ED    EKG EKG Interpretation  Date/Time:  Wednesday January 17 2019 12:56:24 EDT Ventricular Rate:  73 PR Interval:    QRS Duration: 95 QT Interval:  420 QTC Calculation: 463 R Axis:   -6 Text Interpretation:  Sinus rhythm Prolonged PR interval Borderline T wave abnormalities since last tracing no significant change Confirmed by Daleen Bo [phone]) on 01/17/2019 1:05:55 PM   Radiology Ct Head Wo Contrast  Result Date: 01/17/2019 CLINICAL DATA:  Altered mental status. EXAM: CT HEAD WITHOUT CONTRAST TECHNIQUE: Contiguous axial images were obtained from the base of the skull through the vertex without intravenous contrast. COMPARISON:  Brain MR dated 06/22/2016 and head CT dated 06/22/2016. FINDINGS: Brain: Normal appearing cerebral hemispheres and posterior fossa structures. Normal size and position of the ventricles. No intracranial hemorrhage, mass lesion or CT evidence of acute  infarction. Vascular: No hyperdense vessel or unexpected calcification. Skull: Normal. Negative for fracture or focal lesion. Sinuses/Orbits: Unremarkable. Other: None. IMPRESSION: Normal examination. Electronically Signed   By: Claudie Revering M.D.   On: 01/17/2019 13:57    Procedures Procedures (including critical care time)  Medications Ordered in ED Medications  sodium chloride 0.9 % bolus 500 mL (0 mLs Intravenous Stopped 01/17/19 1609)     Initial Impression / Assessment and Plan / ED Course  I have reviewed the triage vital signs and the nursing notes.  Pertinent labs & imaging results that were available during my care of the patient were reviewed by me and considered in my medical decision  making (see chart for details).  Clinical Course as of Jan 16 1614  Wed Jan 17, 2019  1527 On reassessment patient is now more alert she is able to sit up and hold conversation with me.  Patient states that she has been feeling more tired over the past few days however she is not sure why.  Patient states that she has been taking her antibiotic doxycycline as prescribed for her right hand cellulitis however did not take her dose today.  Patient denies recent fever or illness she denies any and all pain states that she is tired and no other complaints.  She denies any drug or alcohol use.   [BM]    Clinical Course User Index [BM] Deliah Boston, PA-C      48 year old female presenting from dialysis center for lethargy today.  She was initially difficult to arouse leading to large work-up.  UDS positive for opiates Urinalysis unremarkable Ethanol level negative Ammonia level within normal limits Lactic within normal limits APTT within normal limits PT/INR within normal limits CMP appears baseline for dialysis patient CBC appears baseline CBG 89 EKG without acute abnormalities reviewed with Dr. Eulis Foster CT head:  IMPRESSION:  Normal examination.  - Physical examination only remarkable  for cellulitis of the right hand.  Empirically patient was given vancomycin and fluid bolus today. - Patient began to become more responsive and as above: "On reassessment patient is now more alert she is able to sit up and hold conversation with me.  Patient states that she has been feeling more tired over the past few days however she is not sure why.  Patient states that she has been taking her antibiotic doxycycline as prescribed for her right hand cellulitis however did not take her dose today.  Patient denies recent fever or illness she denies any and all pain states that she is tired and no other complaints.  She denies any drug or alcohol use." - Patient is alert and oriented, suspect patient's fatigue earlier secondary to dialysis versus stress.  Patient was seen and evaluated with Dr. Eulis Foster, please see his note for more details.  No indication for admission at this time.  Discussed with patient need for maintaining regular dialysis appointments as well as antibiotics for cellulitis.  Also advised patient see a counselor and PCP follow-up along with nephrology follow-up.  Of note patient with paperwork from dialysis center which show she received daptomycin today, our current vancomycin has been discontinued.  Patient is now ambulatory around room without assistance or difficulty.  At this time there does not appear to be any evidence of an acute emergency medical condition and the patient appears stable for discharge with appropriate outpatient follow up. Diagnosis was discussed with patient who verbalizes understanding of care plan and is agreeable to discharge. I have discussed return precautions with patient who verbalizes understanding of return precautions. Patient strongly encouraged to follow-up with their PCP and nephrology. All questions answered.  Patient has been discharged in good condition.  Patient seen and evaluated by Dr. Eulis Foster today who agrees with plan of care, discharge with  outpatient follow-up.  Note: Portions of this report may have been transcribed using voice recognition software. Every effort was made to ensure accuracy; however, inadvertent computerized transcription errors may still be present.  Final Clinical Impressions(s) / ED Diagnoses   Final diagnoses:  Fatigue, unspecified type    ED Discharge Orders    None       Deliah Boston, PA-C  01/17/19 1638    Deliah Boston, PA-C 01/17/19 1639    Daleen Bo, MD 01/17/19 1731

## 2019-01-18 DIAGNOSIS — E119 Type 2 diabetes mellitus without complications: Secondary | ICD-10-CM | POA: Diagnosis not present

## 2019-01-18 DIAGNOSIS — A4902 Methicillin resistant Staphylococcus aureus infection, unspecified site: Secondary | ICD-10-CM | POA: Diagnosis not present

## 2019-01-18 DIAGNOSIS — L03119 Cellulitis of unspecified part of limb: Secondary | ICD-10-CM | POA: Diagnosis not present

## 2019-01-18 DIAGNOSIS — N2581 Secondary hyperparathyroidism of renal origin: Secondary | ICD-10-CM | POA: Diagnosis not present

## 2019-01-18 DIAGNOSIS — S61401D Unspecified open wound of right hand, subsequent encounter: Secondary | ICD-10-CM | POA: Diagnosis not present

## 2019-01-18 DIAGNOSIS — N186 End stage renal disease: Secondary | ICD-10-CM | POA: Diagnosis not present

## 2019-01-19 DIAGNOSIS — A4902 Methicillin resistant Staphylococcus aureus infection, unspecified site: Secondary | ICD-10-CM | POA: Diagnosis not present

## 2019-01-19 DIAGNOSIS — N186 End stage renal disease: Secondary | ICD-10-CM | POA: Diagnosis not present

## 2019-01-19 DIAGNOSIS — L03119 Cellulitis of unspecified part of limb: Secondary | ICD-10-CM | POA: Diagnosis not present

## 2019-01-19 DIAGNOSIS — S61401D Unspecified open wound of right hand, subsequent encounter: Secondary | ICD-10-CM | POA: Diagnosis not present

## 2019-01-19 DIAGNOSIS — E119 Type 2 diabetes mellitus without complications: Secondary | ICD-10-CM | POA: Diagnosis not present

## 2019-01-19 DIAGNOSIS — N2581 Secondary hyperparathyroidism of renal origin: Secondary | ICD-10-CM | POA: Diagnosis not present

## 2019-01-22 ENCOUNTER — Telehealth: Payer: Self-pay | Admitting: Emergency Medicine

## 2019-01-22 DIAGNOSIS — E119 Type 2 diabetes mellitus without complications: Secondary | ICD-10-CM | POA: Diagnosis not present

## 2019-01-22 DIAGNOSIS — N2581 Secondary hyperparathyroidism of renal origin: Secondary | ICD-10-CM | POA: Diagnosis not present

## 2019-01-22 DIAGNOSIS — S61401D Unspecified open wound of right hand, subsequent encounter: Secondary | ICD-10-CM | POA: Diagnosis not present

## 2019-01-22 DIAGNOSIS — A4902 Methicillin resistant Staphylococcus aureus infection, unspecified site: Secondary | ICD-10-CM | POA: Diagnosis not present

## 2019-01-22 DIAGNOSIS — L03119 Cellulitis of unspecified part of limb: Secondary | ICD-10-CM | POA: Diagnosis not present

## 2019-01-22 DIAGNOSIS — N186 End stage renal disease: Secondary | ICD-10-CM | POA: Diagnosis not present

## 2019-01-22 LAB — CULTURE, BLOOD (ROUTINE X 2)
Culture: NO GROWTH
Culture: NO GROWTH
Special Requests: ADEQUATE

## 2019-01-22 NOTE — Telephone Encounter (Signed)
Spoke with patient's mother she will have patient call us back.

## 2019-01-22 NOTE — Telephone Encounter (Signed)
   Primary Cardiologist:  No primary care provider on file.   Patient contacted.  History reviewed.  No symptoms to suggest any unstable cardiac conditions.  Based on discussion, with current pandemic situation, we will be postponing this appointment for [name]. If symptoms change, she has been instructed to contact our office.  Dr. Agustin Cree spoke with Dr. Hassell Done (referring) and he agrees it is acceptable to postpone.   Routing to C19 CANCEL pool for tracking (P CV DIV CV19 CANCEL) and assigning priority (1 = 4-6 wks, 2 = 6-12 wks, 3 = >12 wks).  Ashok Norris, RN  01/22/2019 3:03 PM         .

## 2019-01-23 ENCOUNTER — Ambulatory Visit: Payer: Medicare Other | Admitting: Cardiology

## 2019-01-23 NOTE — Telephone Encounter (Signed)
Patient called back and informed of cancellation. She was informed to call us with any other concerns.

## 2019-01-24 DIAGNOSIS — A4902 Methicillin resistant Staphylococcus aureus infection, unspecified site: Secondary | ICD-10-CM | POA: Diagnosis not present

## 2019-01-24 DIAGNOSIS — S61401D Unspecified open wound of right hand, subsequent encounter: Secondary | ICD-10-CM | POA: Diagnosis not present

## 2019-01-24 DIAGNOSIS — N186 End stage renal disease: Secondary | ICD-10-CM | POA: Diagnosis not present

## 2019-01-24 DIAGNOSIS — N2581 Secondary hyperparathyroidism of renal origin: Secondary | ICD-10-CM | POA: Diagnosis not present

## 2019-01-24 DIAGNOSIS — L03119 Cellulitis of unspecified part of limb: Secondary | ICD-10-CM | POA: Diagnosis not present

## 2019-01-24 DIAGNOSIS — E119 Type 2 diabetes mellitus without complications: Secondary | ICD-10-CM | POA: Diagnosis not present

## 2019-01-26 DIAGNOSIS — N186 End stage renal disease: Secondary | ICD-10-CM | POA: Diagnosis not present

## 2019-01-26 DIAGNOSIS — S61401D Unspecified open wound of right hand, subsequent encounter: Secondary | ICD-10-CM | POA: Diagnosis not present

## 2019-01-26 DIAGNOSIS — E119 Type 2 diabetes mellitus without complications: Secondary | ICD-10-CM | POA: Diagnosis not present

## 2019-01-26 DIAGNOSIS — L03119 Cellulitis of unspecified part of limb: Secondary | ICD-10-CM | POA: Diagnosis not present

## 2019-01-26 DIAGNOSIS — N2581 Secondary hyperparathyroidism of renal origin: Secondary | ICD-10-CM | POA: Diagnosis not present

## 2019-01-26 DIAGNOSIS — A4902 Methicillin resistant Staphylococcus aureus infection, unspecified site: Secondary | ICD-10-CM | POA: Diagnosis not present

## 2019-01-29 DIAGNOSIS — E119 Type 2 diabetes mellitus without complications: Secondary | ICD-10-CM | POA: Diagnosis not present

## 2019-01-29 DIAGNOSIS — A4902 Methicillin resistant Staphylococcus aureus infection, unspecified site: Secondary | ICD-10-CM | POA: Diagnosis not present

## 2019-01-29 DIAGNOSIS — N2581 Secondary hyperparathyroidism of renal origin: Secondary | ICD-10-CM | POA: Diagnosis not present

## 2019-01-29 DIAGNOSIS — L03119 Cellulitis of unspecified part of limb: Secondary | ICD-10-CM | POA: Diagnosis not present

## 2019-01-29 DIAGNOSIS — N186 End stage renal disease: Secondary | ICD-10-CM | POA: Diagnosis not present

## 2019-01-29 DIAGNOSIS — S61401D Unspecified open wound of right hand, subsequent encounter: Secondary | ICD-10-CM | POA: Diagnosis not present

## 2019-01-31 DIAGNOSIS — E119 Type 2 diabetes mellitus without complications: Secondary | ICD-10-CM | POA: Diagnosis not present

## 2019-01-31 DIAGNOSIS — D631 Anemia in chronic kidney disease: Secondary | ICD-10-CM | POA: Diagnosis not present

## 2019-01-31 DIAGNOSIS — Z992 Dependence on renal dialysis: Secondary | ICD-10-CM | POA: Diagnosis not present

## 2019-01-31 DIAGNOSIS — N186 End stage renal disease: Secondary | ICD-10-CM | POA: Diagnosis not present

## 2019-01-31 DIAGNOSIS — N033 Chronic nephritic syndrome with diffuse mesangial proliferative glomerulonephritis: Secondary | ICD-10-CM | POA: Diagnosis not present

## 2019-01-31 DIAGNOSIS — D509 Iron deficiency anemia, unspecified: Secondary | ICD-10-CM | POA: Diagnosis not present

## 2019-01-31 DIAGNOSIS — N2581 Secondary hyperparathyroidism of renal origin: Secondary | ICD-10-CM | POA: Diagnosis not present

## 2019-02-02 DIAGNOSIS — E119 Type 2 diabetes mellitus without complications: Secondary | ICD-10-CM | POA: Diagnosis not present

## 2019-02-02 DIAGNOSIS — D631 Anemia in chronic kidney disease: Secondary | ICD-10-CM | POA: Diagnosis not present

## 2019-02-02 DIAGNOSIS — D509 Iron deficiency anemia, unspecified: Secondary | ICD-10-CM | POA: Diagnosis not present

## 2019-02-02 DIAGNOSIS — N2581 Secondary hyperparathyroidism of renal origin: Secondary | ICD-10-CM | POA: Diagnosis not present

## 2019-02-02 DIAGNOSIS — N186 End stage renal disease: Secondary | ICD-10-CM | POA: Diagnosis not present

## 2019-02-05 DIAGNOSIS — E119 Type 2 diabetes mellitus without complications: Secondary | ICD-10-CM | POA: Diagnosis not present

## 2019-02-05 DIAGNOSIS — N2581 Secondary hyperparathyroidism of renal origin: Secondary | ICD-10-CM | POA: Diagnosis not present

## 2019-02-05 DIAGNOSIS — D509 Iron deficiency anemia, unspecified: Secondary | ICD-10-CM | POA: Diagnosis not present

## 2019-02-05 DIAGNOSIS — N186 End stage renal disease: Secondary | ICD-10-CM | POA: Diagnosis not present

## 2019-02-05 DIAGNOSIS — D631 Anemia in chronic kidney disease: Secondary | ICD-10-CM | POA: Diagnosis not present

## 2019-02-07 DIAGNOSIS — E119 Type 2 diabetes mellitus without complications: Secondary | ICD-10-CM | POA: Diagnosis not present

## 2019-02-07 DIAGNOSIS — D509 Iron deficiency anemia, unspecified: Secondary | ICD-10-CM | POA: Diagnosis not present

## 2019-02-07 DIAGNOSIS — N2581 Secondary hyperparathyroidism of renal origin: Secondary | ICD-10-CM | POA: Diagnosis not present

## 2019-02-07 DIAGNOSIS — D631 Anemia in chronic kidney disease: Secondary | ICD-10-CM | POA: Diagnosis not present

## 2019-02-07 DIAGNOSIS — N186 End stage renal disease: Secondary | ICD-10-CM | POA: Diagnosis not present

## 2019-02-09 DIAGNOSIS — D631 Anemia in chronic kidney disease: Secondary | ICD-10-CM | POA: Diagnosis not present

## 2019-02-09 DIAGNOSIS — N2581 Secondary hyperparathyroidism of renal origin: Secondary | ICD-10-CM | POA: Diagnosis not present

## 2019-02-09 DIAGNOSIS — E119 Type 2 diabetes mellitus without complications: Secondary | ICD-10-CM | POA: Diagnosis not present

## 2019-02-09 DIAGNOSIS — N186 End stage renal disease: Secondary | ICD-10-CM | POA: Diagnosis not present

## 2019-02-09 DIAGNOSIS — D509 Iron deficiency anemia, unspecified: Secondary | ICD-10-CM | POA: Diagnosis not present

## 2019-02-12 DIAGNOSIS — N2581 Secondary hyperparathyroidism of renal origin: Secondary | ICD-10-CM | POA: Diagnosis not present

## 2019-02-12 DIAGNOSIS — D631 Anemia in chronic kidney disease: Secondary | ICD-10-CM | POA: Diagnosis not present

## 2019-02-12 DIAGNOSIS — N186 End stage renal disease: Secondary | ICD-10-CM | POA: Diagnosis not present

## 2019-02-12 DIAGNOSIS — D509 Iron deficiency anemia, unspecified: Secondary | ICD-10-CM | POA: Diagnosis not present

## 2019-02-12 DIAGNOSIS — E119 Type 2 diabetes mellitus without complications: Secondary | ICD-10-CM | POA: Diagnosis not present

## 2019-02-13 NOTE — Telephone Encounter (Signed)
Left message for patient to return call to be rescheduled for a televisit.  

## 2019-02-14 DIAGNOSIS — N2581 Secondary hyperparathyroidism of renal origin: Secondary | ICD-10-CM | POA: Diagnosis not present

## 2019-02-14 DIAGNOSIS — N186 End stage renal disease: Secondary | ICD-10-CM | POA: Diagnosis not present

## 2019-02-14 DIAGNOSIS — D509 Iron deficiency anemia, unspecified: Secondary | ICD-10-CM | POA: Diagnosis not present

## 2019-02-14 DIAGNOSIS — E119 Type 2 diabetes mellitus without complications: Secondary | ICD-10-CM | POA: Diagnosis not present

## 2019-02-14 DIAGNOSIS — D631 Anemia in chronic kidney disease: Secondary | ICD-10-CM | POA: Diagnosis not present

## 2019-02-16 DIAGNOSIS — N186 End stage renal disease: Secondary | ICD-10-CM | POA: Diagnosis not present

## 2019-02-16 DIAGNOSIS — D631 Anemia in chronic kidney disease: Secondary | ICD-10-CM | POA: Diagnosis not present

## 2019-02-16 DIAGNOSIS — E119 Type 2 diabetes mellitus without complications: Secondary | ICD-10-CM | POA: Diagnosis not present

## 2019-02-16 DIAGNOSIS — N2581 Secondary hyperparathyroidism of renal origin: Secondary | ICD-10-CM | POA: Diagnosis not present

## 2019-02-16 DIAGNOSIS — D509 Iron deficiency anemia, unspecified: Secondary | ICD-10-CM | POA: Diagnosis not present

## 2019-02-19 DIAGNOSIS — D631 Anemia in chronic kidney disease: Secondary | ICD-10-CM | POA: Diagnosis not present

## 2019-02-19 DIAGNOSIS — N186 End stage renal disease: Secondary | ICD-10-CM | POA: Diagnosis not present

## 2019-02-19 DIAGNOSIS — N2581 Secondary hyperparathyroidism of renal origin: Secondary | ICD-10-CM | POA: Diagnosis not present

## 2019-02-19 DIAGNOSIS — D509 Iron deficiency anemia, unspecified: Secondary | ICD-10-CM | POA: Diagnosis not present

## 2019-02-19 DIAGNOSIS — E119 Type 2 diabetes mellitus without complications: Secondary | ICD-10-CM | POA: Diagnosis not present

## 2019-02-21 DIAGNOSIS — E119 Type 2 diabetes mellitus without complications: Secondary | ICD-10-CM | POA: Diagnosis not present

## 2019-02-21 DIAGNOSIS — D631 Anemia in chronic kidney disease: Secondary | ICD-10-CM | POA: Diagnosis not present

## 2019-02-21 DIAGNOSIS — D509 Iron deficiency anemia, unspecified: Secondary | ICD-10-CM | POA: Diagnosis not present

## 2019-02-21 DIAGNOSIS — N2581 Secondary hyperparathyroidism of renal origin: Secondary | ICD-10-CM | POA: Diagnosis not present

## 2019-02-21 DIAGNOSIS — N186 End stage renal disease: Secondary | ICD-10-CM | POA: Diagnosis not present

## 2019-02-22 DIAGNOSIS — G894 Chronic pain syndrome: Secondary | ICD-10-CM | POA: Diagnosis not present

## 2019-02-22 DIAGNOSIS — N186 End stage renal disease: Secondary | ICD-10-CM | POA: Diagnosis not present

## 2019-02-22 DIAGNOSIS — M5417 Radiculopathy, lumbosacral region: Secondary | ICD-10-CM | POA: Diagnosis not present

## 2019-02-22 DIAGNOSIS — M5418 Radiculopathy, sacral and sacrococcygeal region: Secondary | ICD-10-CM | POA: Diagnosis not present

## 2019-02-23 DIAGNOSIS — E119 Type 2 diabetes mellitus without complications: Secondary | ICD-10-CM | POA: Diagnosis not present

## 2019-02-23 DIAGNOSIS — N186 End stage renal disease: Secondary | ICD-10-CM | POA: Diagnosis not present

## 2019-02-23 DIAGNOSIS — N2581 Secondary hyperparathyroidism of renal origin: Secondary | ICD-10-CM | POA: Diagnosis not present

## 2019-02-23 DIAGNOSIS — D631 Anemia in chronic kidney disease: Secondary | ICD-10-CM | POA: Diagnosis not present

## 2019-02-23 DIAGNOSIS — D509 Iron deficiency anemia, unspecified: Secondary | ICD-10-CM | POA: Diagnosis not present

## 2019-02-26 DIAGNOSIS — D509 Iron deficiency anemia, unspecified: Secondary | ICD-10-CM | POA: Diagnosis not present

## 2019-02-26 DIAGNOSIS — N186 End stage renal disease: Secondary | ICD-10-CM | POA: Diagnosis not present

## 2019-02-26 DIAGNOSIS — N2581 Secondary hyperparathyroidism of renal origin: Secondary | ICD-10-CM | POA: Diagnosis not present

## 2019-02-26 DIAGNOSIS — D631 Anemia in chronic kidney disease: Secondary | ICD-10-CM | POA: Diagnosis not present

## 2019-02-26 DIAGNOSIS — E119 Type 2 diabetes mellitus without complications: Secondary | ICD-10-CM | POA: Diagnosis not present

## 2019-02-28 DIAGNOSIS — N186 End stage renal disease: Secondary | ICD-10-CM | POA: Diagnosis not present

## 2019-02-28 DIAGNOSIS — N2581 Secondary hyperparathyroidism of renal origin: Secondary | ICD-10-CM | POA: Diagnosis not present

## 2019-02-28 DIAGNOSIS — D631 Anemia in chronic kidney disease: Secondary | ICD-10-CM | POA: Diagnosis not present

## 2019-02-28 DIAGNOSIS — D509 Iron deficiency anemia, unspecified: Secondary | ICD-10-CM | POA: Diagnosis not present

## 2019-02-28 DIAGNOSIS — E119 Type 2 diabetes mellitus without complications: Secondary | ICD-10-CM | POA: Diagnosis not present

## 2019-03-02 DIAGNOSIS — N186 End stage renal disease: Secondary | ICD-10-CM | POA: Diagnosis not present

## 2019-03-02 DIAGNOSIS — N033 Chronic nephritic syndrome with diffuse mesangial proliferative glomerulonephritis: Secondary | ICD-10-CM | POA: Diagnosis not present

## 2019-03-02 DIAGNOSIS — D509 Iron deficiency anemia, unspecified: Secondary | ICD-10-CM | POA: Diagnosis not present

## 2019-03-02 DIAGNOSIS — N2581 Secondary hyperparathyroidism of renal origin: Secondary | ICD-10-CM | POA: Diagnosis not present

## 2019-03-02 DIAGNOSIS — Z992 Dependence on renal dialysis: Secondary | ICD-10-CM | POA: Diagnosis not present

## 2019-03-02 DIAGNOSIS — E119 Type 2 diabetes mellitus without complications: Secondary | ICD-10-CM | POA: Diagnosis not present

## 2019-03-02 DIAGNOSIS — D631 Anemia in chronic kidney disease: Secondary | ICD-10-CM | POA: Diagnosis not present

## 2019-03-06 DIAGNOSIS — D509 Iron deficiency anemia, unspecified: Secondary | ICD-10-CM | POA: Diagnosis not present

## 2019-03-06 DIAGNOSIS — N2581 Secondary hyperparathyroidism of renal origin: Secondary | ICD-10-CM | POA: Diagnosis not present

## 2019-03-06 DIAGNOSIS — E119 Type 2 diabetes mellitus without complications: Secondary | ICD-10-CM | POA: Diagnosis not present

## 2019-03-06 DIAGNOSIS — D631 Anemia in chronic kidney disease: Secondary | ICD-10-CM | POA: Diagnosis not present

## 2019-03-06 DIAGNOSIS — N186 End stage renal disease: Secondary | ICD-10-CM | POA: Diagnosis not present

## 2019-03-07 DIAGNOSIS — N2581 Secondary hyperparathyroidism of renal origin: Secondary | ICD-10-CM | POA: Diagnosis not present

## 2019-03-07 DIAGNOSIS — D509 Iron deficiency anemia, unspecified: Secondary | ICD-10-CM | POA: Diagnosis not present

## 2019-03-07 DIAGNOSIS — D631 Anemia in chronic kidney disease: Secondary | ICD-10-CM | POA: Diagnosis not present

## 2019-03-07 DIAGNOSIS — N186 End stage renal disease: Secondary | ICD-10-CM | POA: Diagnosis not present

## 2019-03-07 DIAGNOSIS — E119 Type 2 diabetes mellitus without complications: Secondary | ICD-10-CM | POA: Diagnosis not present

## 2019-03-12 DIAGNOSIS — D509 Iron deficiency anemia, unspecified: Secondary | ICD-10-CM | POA: Diagnosis not present

## 2019-03-12 DIAGNOSIS — E119 Type 2 diabetes mellitus without complications: Secondary | ICD-10-CM | POA: Diagnosis not present

## 2019-03-12 DIAGNOSIS — N2581 Secondary hyperparathyroidism of renal origin: Secondary | ICD-10-CM | POA: Diagnosis not present

## 2019-03-12 DIAGNOSIS — D631 Anemia in chronic kidney disease: Secondary | ICD-10-CM | POA: Diagnosis not present

## 2019-03-12 DIAGNOSIS — N186 End stage renal disease: Secondary | ICD-10-CM | POA: Diagnosis not present

## 2019-03-14 DIAGNOSIS — N2581 Secondary hyperparathyroidism of renal origin: Secondary | ICD-10-CM | POA: Diagnosis not present

## 2019-03-14 DIAGNOSIS — N186 End stage renal disease: Secondary | ICD-10-CM | POA: Diagnosis not present

## 2019-03-14 DIAGNOSIS — D631 Anemia in chronic kidney disease: Secondary | ICD-10-CM | POA: Diagnosis not present

## 2019-03-14 DIAGNOSIS — D509 Iron deficiency anemia, unspecified: Secondary | ICD-10-CM | POA: Diagnosis not present

## 2019-03-14 DIAGNOSIS — E119 Type 2 diabetes mellitus without complications: Secondary | ICD-10-CM | POA: Diagnosis not present

## 2019-03-16 DIAGNOSIS — E119 Type 2 diabetes mellitus without complications: Secondary | ICD-10-CM | POA: Diagnosis not present

## 2019-03-16 DIAGNOSIS — D631 Anemia in chronic kidney disease: Secondary | ICD-10-CM | POA: Diagnosis not present

## 2019-03-16 DIAGNOSIS — D509 Iron deficiency anemia, unspecified: Secondary | ICD-10-CM | POA: Diagnosis not present

## 2019-03-16 DIAGNOSIS — N2581 Secondary hyperparathyroidism of renal origin: Secondary | ICD-10-CM | POA: Diagnosis not present

## 2019-03-16 DIAGNOSIS — N186 End stage renal disease: Secondary | ICD-10-CM | POA: Diagnosis not present

## 2019-03-19 DIAGNOSIS — D509 Iron deficiency anemia, unspecified: Secondary | ICD-10-CM | POA: Diagnosis not present

## 2019-03-19 DIAGNOSIS — N186 End stage renal disease: Secondary | ICD-10-CM | POA: Diagnosis not present

## 2019-03-19 DIAGNOSIS — E119 Type 2 diabetes mellitus without complications: Secondary | ICD-10-CM | POA: Diagnosis not present

## 2019-03-19 DIAGNOSIS — D631 Anemia in chronic kidney disease: Secondary | ICD-10-CM | POA: Diagnosis not present

## 2019-03-19 DIAGNOSIS — N2581 Secondary hyperparathyroidism of renal origin: Secondary | ICD-10-CM | POA: Diagnosis not present

## 2019-03-21 DIAGNOSIS — D631 Anemia in chronic kidney disease: Secondary | ICD-10-CM | POA: Diagnosis not present

## 2019-03-21 DIAGNOSIS — D509 Iron deficiency anemia, unspecified: Secondary | ICD-10-CM | POA: Diagnosis not present

## 2019-03-21 DIAGNOSIS — N2581 Secondary hyperparathyroidism of renal origin: Secondary | ICD-10-CM | POA: Diagnosis not present

## 2019-03-21 DIAGNOSIS — N186 End stage renal disease: Secondary | ICD-10-CM | POA: Diagnosis not present

## 2019-03-21 DIAGNOSIS — E119 Type 2 diabetes mellitus without complications: Secondary | ICD-10-CM | POA: Diagnosis not present

## 2019-03-22 ENCOUNTER — Encounter: Payer: Self-pay | Admitting: Nephrology

## 2019-03-22 DIAGNOSIS — T82858A Stenosis of vascular prosthetic devices, implants and grafts, initial encounter: Secondary | ICD-10-CM | POA: Diagnosis not present

## 2019-03-22 DIAGNOSIS — N186 End stage renal disease: Secondary | ICD-10-CM | POA: Diagnosis not present

## 2019-03-22 DIAGNOSIS — Z992 Dependence on renal dialysis: Secondary | ICD-10-CM | POA: Diagnosis not present

## 2019-03-22 DIAGNOSIS — I871 Compression of vein: Secondary | ICD-10-CM | POA: Diagnosis not present

## 2019-03-24 DIAGNOSIS — D631 Anemia in chronic kidney disease: Secondary | ICD-10-CM | POA: Diagnosis not present

## 2019-03-24 DIAGNOSIS — E119 Type 2 diabetes mellitus without complications: Secondary | ICD-10-CM | POA: Diagnosis not present

## 2019-03-24 DIAGNOSIS — D509 Iron deficiency anemia, unspecified: Secondary | ICD-10-CM | POA: Diagnosis not present

## 2019-03-24 DIAGNOSIS — N2581 Secondary hyperparathyroidism of renal origin: Secondary | ICD-10-CM | POA: Diagnosis not present

## 2019-03-24 DIAGNOSIS — N186 End stage renal disease: Secondary | ICD-10-CM | POA: Diagnosis not present

## 2019-03-26 DIAGNOSIS — N2581 Secondary hyperparathyroidism of renal origin: Secondary | ICD-10-CM | POA: Diagnosis not present

## 2019-03-26 DIAGNOSIS — N186 End stage renal disease: Secondary | ICD-10-CM | POA: Diagnosis not present

## 2019-03-26 DIAGNOSIS — D631 Anemia in chronic kidney disease: Secondary | ICD-10-CM | POA: Diagnosis not present

## 2019-03-26 DIAGNOSIS — E119 Type 2 diabetes mellitus without complications: Secondary | ICD-10-CM | POA: Diagnosis not present

## 2019-03-26 DIAGNOSIS — D509 Iron deficiency anemia, unspecified: Secondary | ICD-10-CM | POA: Diagnosis not present

## 2019-03-28 DIAGNOSIS — N2581 Secondary hyperparathyroidism of renal origin: Secondary | ICD-10-CM | POA: Diagnosis not present

## 2019-03-28 DIAGNOSIS — N186 End stage renal disease: Secondary | ICD-10-CM | POA: Diagnosis not present

## 2019-03-28 DIAGNOSIS — D631 Anemia in chronic kidney disease: Secondary | ICD-10-CM | POA: Diagnosis not present

## 2019-03-28 DIAGNOSIS — D509 Iron deficiency anemia, unspecified: Secondary | ICD-10-CM | POA: Diagnosis not present

## 2019-03-28 DIAGNOSIS — E119 Type 2 diabetes mellitus without complications: Secondary | ICD-10-CM | POA: Diagnosis not present

## 2019-03-30 DIAGNOSIS — D509 Iron deficiency anemia, unspecified: Secondary | ICD-10-CM | POA: Diagnosis not present

## 2019-03-30 DIAGNOSIS — E119 Type 2 diabetes mellitus without complications: Secondary | ICD-10-CM | POA: Diagnosis not present

## 2019-03-30 DIAGNOSIS — N186 End stage renal disease: Secondary | ICD-10-CM | POA: Diagnosis not present

## 2019-03-30 DIAGNOSIS — N2581 Secondary hyperparathyroidism of renal origin: Secondary | ICD-10-CM | POA: Diagnosis not present

## 2019-03-30 DIAGNOSIS — D631 Anemia in chronic kidney disease: Secondary | ICD-10-CM | POA: Diagnosis not present

## 2019-04-02 DIAGNOSIS — M5418 Radiculopathy, sacral and sacrococcygeal region: Secondary | ICD-10-CM | POA: Diagnosis not present

## 2019-04-02 DIAGNOSIS — N2581 Secondary hyperparathyroidism of renal origin: Secondary | ICD-10-CM | POA: Diagnosis not present

## 2019-04-02 DIAGNOSIS — Z992 Dependence on renal dialysis: Secondary | ICD-10-CM | POA: Diagnosis not present

## 2019-04-02 DIAGNOSIS — E119 Type 2 diabetes mellitus without complications: Secondary | ICD-10-CM | POA: Diagnosis not present

## 2019-04-02 DIAGNOSIS — N033 Chronic nephritic syndrome with diffuse mesangial proliferative glomerulonephritis: Secondary | ICD-10-CM | POA: Diagnosis not present

## 2019-04-02 DIAGNOSIS — D509 Iron deficiency anemia, unspecified: Secondary | ICD-10-CM | POA: Diagnosis not present

## 2019-04-02 DIAGNOSIS — G894 Chronic pain syndrome: Secondary | ICD-10-CM | POA: Diagnosis not present

## 2019-04-02 DIAGNOSIS — N186 End stage renal disease: Secondary | ICD-10-CM | POA: Diagnosis not present

## 2019-04-02 DIAGNOSIS — D631 Anemia in chronic kidney disease: Secondary | ICD-10-CM | POA: Diagnosis not present

## 2019-04-02 DIAGNOSIS — M5417 Radiculopathy, lumbosacral region: Secondary | ICD-10-CM | POA: Diagnosis not present

## 2019-04-04 DIAGNOSIS — E119 Type 2 diabetes mellitus without complications: Secondary | ICD-10-CM | POA: Diagnosis not present

## 2019-04-04 DIAGNOSIS — N186 End stage renal disease: Secondary | ICD-10-CM | POA: Diagnosis not present

## 2019-04-04 DIAGNOSIS — D509 Iron deficiency anemia, unspecified: Secondary | ICD-10-CM | POA: Diagnosis not present

## 2019-04-04 DIAGNOSIS — D631 Anemia in chronic kidney disease: Secondary | ICD-10-CM | POA: Diagnosis not present

## 2019-04-04 DIAGNOSIS — N2581 Secondary hyperparathyroidism of renal origin: Secondary | ICD-10-CM | POA: Diagnosis not present

## 2019-04-09 DIAGNOSIS — D631 Anemia in chronic kidney disease: Secondary | ICD-10-CM | POA: Diagnosis not present

## 2019-04-09 DIAGNOSIS — E119 Type 2 diabetes mellitus without complications: Secondary | ICD-10-CM | POA: Diagnosis not present

## 2019-04-09 DIAGNOSIS — D509 Iron deficiency anemia, unspecified: Secondary | ICD-10-CM | POA: Diagnosis not present

## 2019-04-09 DIAGNOSIS — N2581 Secondary hyperparathyroidism of renal origin: Secondary | ICD-10-CM | POA: Diagnosis not present

## 2019-04-09 DIAGNOSIS — N186 End stage renal disease: Secondary | ICD-10-CM | POA: Diagnosis not present

## 2019-04-10 DIAGNOSIS — N61 Mastitis without abscess: Secondary | ICD-10-CM | POA: Diagnosis not present

## 2019-04-11 DIAGNOSIS — N2581 Secondary hyperparathyroidism of renal origin: Secondary | ICD-10-CM | POA: Diagnosis not present

## 2019-04-11 DIAGNOSIS — D631 Anemia in chronic kidney disease: Secondary | ICD-10-CM | POA: Diagnosis not present

## 2019-04-11 DIAGNOSIS — N186 End stage renal disease: Secondary | ICD-10-CM | POA: Diagnosis not present

## 2019-04-11 DIAGNOSIS — D509 Iron deficiency anemia, unspecified: Secondary | ICD-10-CM | POA: Diagnosis not present

## 2019-04-11 DIAGNOSIS — E119 Type 2 diabetes mellitus without complications: Secondary | ICD-10-CM | POA: Diagnosis not present

## 2019-04-13 DIAGNOSIS — D631 Anemia in chronic kidney disease: Secondary | ICD-10-CM | POA: Diagnosis not present

## 2019-04-13 DIAGNOSIS — N186 End stage renal disease: Secondary | ICD-10-CM | POA: Diagnosis not present

## 2019-04-13 DIAGNOSIS — D509 Iron deficiency anemia, unspecified: Secondary | ICD-10-CM | POA: Diagnosis not present

## 2019-04-13 DIAGNOSIS — N2581 Secondary hyperparathyroidism of renal origin: Secondary | ICD-10-CM | POA: Diagnosis not present

## 2019-04-13 DIAGNOSIS — E119 Type 2 diabetes mellitus without complications: Secondary | ICD-10-CM | POA: Diagnosis not present

## 2019-04-16 DIAGNOSIS — D509 Iron deficiency anemia, unspecified: Secondary | ICD-10-CM | POA: Diagnosis not present

## 2019-04-16 DIAGNOSIS — D631 Anemia in chronic kidney disease: Secondary | ICD-10-CM | POA: Diagnosis not present

## 2019-04-16 DIAGNOSIS — E119 Type 2 diabetes mellitus without complications: Secondary | ICD-10-CM | POA: Diagnosis not present

## 2019-04-16 DIAGNOSIS — N186 End stage renal disease: Secondary | ICD-10-CM | POA: Diagnosis not present

## 2019-04-16 DIAGNOSIS — N2581 Secondary hyperparathyroidism of renal origin: Secondary | ICD-10-CM | POA: Diagnosis not present

## 2019-04-18 DIAGNOSIS — E119 Type 2 diabetes mellitus without complications: Secondary | ICD-10-CM | POA: Diagnosis not present

## 2019-04-18 DIAGNOSIS — D631 Anemia in chronic kidney disease: Secondary | ICD-10-CM | POA: Diagnosis not present

## 2019-04-18 DIAGNOSIS — N2581 Secondary hyperparathyroidism of renal origin: Secondary | ICD-10-CM | POA: Diagnosis not present

## 2019-04-18 DIAGNOSIS — D509 Iron deficiency anemia, unspecified: Secondary | ICD-10-CM | POA: Diagnosis not present

## 2019-04-18 DIAGNOSIS — N186 End stage renal disease: Secondary | ICD-10-CM | POA: Diagnosis not present

## 2019-04-20 DIAGNOSIS — N186 End stage renal disease: Secondary | ICD-10-CM | POA: Diagnosis not present

## 2019-04-20 DIAGNOSIS — D631 Anemia in chronic kidney disease: Secondary | ICD-10-CM | POA: Diagnosis not present

## 2019-04-20 DIAGNOSIS — D509 Iron deficiency anemia, unspecified: Secondary | ICD-10-CM | POA: Diagnosis not present

## 2019-04-20 DIAGNOSIS — N2581 Secondary hyperparathyroidism of renal origin: Secondary | ICD-10-CM | POA: Diagnosis not present

## 2019-04-20 DIAGNOSIS — E119 Type 2 diabetes mellitus without complications: Secondary | ICD-10-CM | POA: Diagnosis not present

## 2019-04-23 DIAGNOSIS — N186 End stage renal disease: Secondary | ICD-10-CM | POA: Diagnosis not present

## 2019-04-23 DIAGNOSIS — D509 Iron deficiency anemia, unspecified: Secondary | ICD-10-CM | POA: Diagnosis not present

## 2019-04-23 DIAGNOSIS — N2581 Secondary hyperparathyroidism of renal origin: Secondary | ICD-10-CM | POA: Diagnosis not present

## 2019-04-23 DIAGNOSIS — E119 Type 2 diabetes mellitus without complications: Secondary | ICD-10-CM | POA: Diagnosis not present

## 2019-04-23 DIAGNOSIS — D631 Anemia in chronic kidney disease: Secondary | ICD-10-CM | POA: Diagnosis not present

## 2019-04-24 DIAGNOSIS — G894 Chronic pain syndrome: Secondary | ICD-10-CM | POA: Diagnosis not present

## 2019-04-24 DIAGNOSIS — M549 Dorsalgia, unspecified: Secondary | ICD-10-CM | POA: Diagnosis not present

## 2019-04-24 DIAGNOSIS — M5417 Radiculopathy, lumbosacral region: Secondary | ICD-10-CM | POA: Diagnosis not present

## 2019-04-24 DIAGNOSIS — M62830 Muscle spasm of back: Secondary | ICD-10-CM | POA: Diagnosis not present

## 2019-04-25 DIAGNOSIS — D631 Anemia in chronic kidney disease: Secondary | ICD-10-CM | POA: Diagnosis not present

## 2019-04-25 DIAGNOSIS — D509 Iron deficiency anemia, unspecified: Secondary | ICD-10-CM | POA: Diagnosis not present

## 2019-04-25 DIAGNOSIS — N186 End stage renal disease: Secondary | ICD-10-CM | POA: Diagnosis not present

## 2019-04-25 DIAGNOSIS — N2581 Secondary hyperparathyroidism of renal origin: Secondary | ICD-10-CM | POA: Diagnosis not present

## 2019-04-25 DIAGNOSIS — E119 Type 2 diabetes mellitus without complications: Secondary | ICD-10-CM | POA: Diagnosis not present

## 2019-04-27 DIAGNOSIS — N2581 Secondary hyperparathyroidism of renal origin: Secondary | ICD-10-CM | POA: Diagnosis not present

## 2019-04-27 DIAGNOSIS — E119 Type 2 diabetes mellitus without complications: Secondary | ICD-10-CM | POA: Diagnosis not present

## 2019-04-27 DIAGNOSIS — D509 Iron deficiency anemia, unspecified: Secondary | ICD-10-CM | POA: Diagnosis not present

## 2019-04-27 DIAGNOSIS — N186 End stage renal disease: Secondary | ICD-10-CM | POA: Diagnosis not present

## 2019-04-27 DIAGNOSIS — D631 Anemia in chronic kidney disease: Secondary | ICD-10-CM | POA: Diagnosis not present

## 2019-05-01 DIAGNOSIS — D509 Iron deficiency anemia, unspecified: Secondary | ICD-10-CM | POA: Diagnosis not present

## 2019-05-01 DIAGNOSIS — N2581 Secondary hyperparathyroidism of renal origin: Secondary | ICD-10-CM | POA: Diagnosis not present

## 2019-05-01 DIAGNOSIS — D631 Anemia in chronic kidney disease: Secondary | ICD-10-CM | POA: Diagnosis not present

## 2019-05-01 DIAGNOSIS — N186 End stage renal disease: Secondary | ICD-10-CM | POA: Diagnosis not present

## 2019-05-01 DIAGNOSIS — E119 Type 2 diabetes mellitus without complications: Secondary | ICD-10-CM | POA: Diagnosis not present

## 2019-05-02 DIAGNOSIS — E119 Type 2 diabetes mellitus without complications: Secondary | ICD-10-CM | POA: Diagnosis not present

## 2019-05-02 DIAGNOSIS — N186 End stage renal disease: Secondary | ICD-10-CM | POA: Diagnosis not present

## 2019-05-02 DIAGNOSIS — Z992 Dependence on renal dialysis: Secondary | ICD-10-CM | POA: Diagnosis not present

## 2019-05-02 DIAGNOSIS — D631 Anemia in chronic kidney disease: Secondary | ICD-10-CM | POA: Diagnosis not present

## 2019-05-02 DIAGNOSIS — N2581 Secondary hyperparathyroidism of renal origin: Secondary | ICD-10-CM | POA: Diagnosis not present

## 2019-05-02 DIAGNOSIS — N033 Chronic nephritic syndrome with diffuse mesangial proliferative glomerulonephritis: Secondary | ICD-10-CM | POA: Diagnosis not present

## 2019-05-02 DIAGNOSIS — D509 Iron deficiency anemia, unspecified: Secondary | ICD-10-CM | POA: Diagnosis not present

## 2019-05-04 DIAGNOSIS — N186 End stage renal disease: Secondary | ICD-10-CM | POA: Diagnosis not present

## 2019-05-04 DIAGNOSIS — E119 Type 2 diabetes mellitus without complications: Secondary | ICD-10-CM | POA: Diagnosis not present

## 2019-05-04 DIAGNOSIS — N2581 Secondary hyperparathyroidism of renal origin: Secondary | ICD-10-CM | POA: Diagnosis not present

## 2019-05-04 DIAGNOSIS — D631 Anemia in chronic kidney disease: Secondary | ICD-10-CM | POA: Diagnosis not present

## 2019-05-04 DIAGNOSIS — D509 Iron deficiency anemia, unspecified: Secondary | ICD-10-CM | POA: Diagnosis not present

## 2019-05-07 DIAGNOSIS — D631 Anemia in chronic kidney disease: Secondary | ICD-10-CM | POA: Diagnosis not present

## 2019-05-07 DIAGNOSIS — E119 Type 2 diabetes mellitus without complications: Secondary | ICD-10-CM | POA: Diagnosis not present

## 2019-05-07 DIAGNOSIS — N2581 Secondary hyperparathyroidism of renal origin: Secondary | ICD-10-CM | POA: Diagnosis not present

## 2019-05-07 DIAGNOSIS — D509 Iron deficiency anemia, unspecified: Secondary | ICD-10-CM | POA: Diagnosis not present

## 2019-05-07 DIAGNOSIS — N186 End stage renal disease: Secondary | ICD-10-CM | POA: Diagnosis not present

## 2019-05-09 DIAGNOSIS — N2581 Secondary hyperparathyroidism of renal origin: Secondary | ICD-10-CM | POA: Diagnosis not present

## 2019-05-09 DIAGNOSIS — N186 End stage renal disease: Secondary | ICD-10-CM | POA: Diagnosis not present

## 2019-05-09 DIAGNOSIS — D509 Iron deficiency anemia, unspecified: Secondary | ICD-10-CM | POA: Diagnosis not present

## 2019-05-09 DIAGNOSIS — E119 Type 2 diabetes mellitus without complications: Secondary | ICD-10-CM | POA: Diagnosis not present

## 2019-05-09 DIAGNOSIS — D631 Anemia in chronic kidney disease: Secondary | ICD-10-CM | POA: Diagnosis not present

## 2019-05-11 DIAGNOSIS — N2581 Secondary hyperparathyroidism of renal origin: Secondary | ICD-10-CM | POA: Diagnosis not present

## 2019-05-11 DIAGNOSIS — E119 Type 2 diabetes mellitus without complications: Secondary | ICD-10-CM | POA: Diagnosis not present

## 2019-05-11 DIAGNOSIS — D631 Anemia in chronic kidney disease: Secondary | ICD-10-CM | POA: Diagnosis not present

## 2019-05-11 DIAGNOSIS — D509 Iron deficiency anemia, unspecified: Secondary | ICD-10-CM | POA: Diagnosis not present

## 2019-05-11 DIAGNOSIS — N186 End stage renal disease: Secondary | ICD-10-CM | POA: Diagnosis not present

## 2019-05-14 DIAGNOSIS — N186 End stage renal disease: Secondary | ICD-10-CM | POA: Diagnosis not present

## 2019-05-14 DIAGNOSIS — E119 Type 2 diabetes mellitus without complications: Secondary | ICD-10-CM | POA: Diagnosis not present

## 2019-05-14 DIAGNOSIS — D509 Iron deficiency anemia, unspecified: Secondary | ICD-10-CM | POA: Diagnosis not present

## 2019-05-14 DIAGNOSIS — N2581 Secondary hyperparathyroidism of renal origin: Secondary | ICD-10-CM | POA: Diagnosis not present

## 2019-05-14 DIAGNOSIS — D631 Anemia in chronic kidney disease: Secondary | ICD-10-CM | POA: Diagnosis not present

## 2019-05-18 DIAGNOSIS — N186 End stage renal disease: Secondary | ICD-10-CM | POA: Diagnosis not present

## 2019-05-18 DIAGNOSIS — D509 Iron deficiency anemia, unspecified: Secondary | ICD-10-CM | POA: Diagnosis not present

## 2019-05-18 DIAGNOSIS — N2581 Secondary hyperparathyroidism of renal origin: Secondary | ICD-10-CM | POA: Diagnosis not present

## 2019-05-18 DIAGNOSIS — E119 Type 2 diabetes mellitus without complications: Secondary | ICD-10-CM | POA: Diagnosis not present

## 2019-05-18 DIAGNOSIS — D631 Anemia in chronic kidney disease: Secondary | ICD-10-CM | POA: Diagnosis not present

## 2019-05-21 DIAGNOSIS — D509 Iron deficiency anemia, unspecified: Secondary | ICD-10-CM | POA: Diagnosis not present

## 2019-05-21 DIAGNOSIS — N2581 Secondary hyperparathyroidism of renal origin: Secondary | ICD-10-CM | POA: Diagnosis not present

## 2019-05-21 DIAGNOSIS — N186 End stage renal disease: Secondary | ICD-10-CM | POA: Diagnosis not present

## 2019-05-21 DIAGNOSIS — E119 Type 2 diabetes mellitus without complications: Secondary | ICD-10-CM | POA: Diagnosis not present

## 2019-05-21 DIAGNOSIS — D631 Anemia in chronic kidney disease: Secondary | ICD-10-CM | POA: Diagnosis not present

## 2019-05-23 DIAGNOSIS — N2581 Secondary hyperparathyroidism of renal origin: Secondary | ICD-10-CM | POA: Diagnosis not present

## 2019-05-23 DIAGNOSIS — D631 Anemia in chronic kidney disease: Secondary | ICD-10-CM | POA: Diagnosis not present

## 2019-05-23 DIAGNOSIS — N186 End stage renal disease: Secondary | ICD-10-CM | POA: Diagnosis not present

## 2019-05-23 DIAGNOSIS — E119 Type 2 diabetes mellitus without complications: Secondary | ICD-10-CM | POA: Diagnosis not present

## 2019-05-23 DIAGNOSIS — D509 Iron deficiency anemia, unspecified: Secondary | ICD-10-CM | POA: Diagnosis not present

## 2019-05-25 DIAGNOSIS — E119 Type 2 diabetes mellitus without complications: Secondary | ICD-10-CM | POA: Diagnosis not present

## 2019-05-25 DIAGNOSIS — N2581 Secondary hyperparathyroidism of renal origin: Secondary | ICD-10-CM | POA: Diagnosis not present

## 2019-05-25 DIAGNOSIS — D509 Iron deficiency anemia, unspecified: Secondary | ICD-10-CM | POA: Diagnosis not present

## 2019-05-25 DIAGNOSIS — D631 Anemia in chronic kidney disease: Secondary | ICD-10-CM | POA: Diagnosis not present

## 2019-05-25 DIAGNOSIS — N186 End stage renal disease: Secondary | ICD-10-CM | POA: Diagnosis not present

## 2019-05-28 DIAGNOSIS — D509 Iron deficiency anemia, unspecified: Secondary | ICD-10-CM | POA: Diagnosis not present

## 2019-05-28 DIAGNOSIS — N186 End stage renal disease: Secondary | ICD-10-CM | POA: Diagnosis not present

## 2019-05-28 DIAGNOSIS — E119 Type 2 diabetes mellitus without complications: Secondary | ICD-10-CM | POA: Diagnosis not present

## 2019-05-28 DIAGNOSIS — D631 Anemia in chronic kidney disease: Secondary | ICD-10-CM | POA: Diagnosis not present

## 2019-05-28 DIAGNOSIS — N2581 Secondary hyperparathyroidism of renal origin: Secondary | ICD-10-CM | POA: Diagnosis not present

## 2019-05-30 DIAGNOSIS — N186 End stage renal disease: Secondary | ICD-10-CM | POA: Diagnosis not present

## 2019-05-30 DIAGNOSIS — E119 Type 2 diabetes mellitus without complications: Secondary | ICD-10-CM | POA: Diagnosis not present

## 2019-05-30 DIAGNOSIS — N2581 Secondary hyperparathyroidism of renal origin: Secondary | ICD-10-CM | POA: Diagnosis not present

## 2019-05-30 DIAGNOSIS — D631 Anemia in chronic kidney disease: Secondary | ICD-10-CM | POA: Diagnosis not present

## 2019-05-30 DIAGNOSIS — D509 Iron deficiency anemia, unspecified: Secondary | ICD-10-CM | POA: Diagnosis not present

## 2019-06-01 DIAGNOSIS — E119 Type 2 diabetes mellitus without complications: Secondary | ICD-10-CM | POA: Diagnosis not present

## 2019-06-01 DIAGNOSIS — D631 Anemia in chronic kidney disease: Secondary | ICD-10-CM | POA: Diagnosis not present

## 2019-06-01 DIAGNOSIS — D509 Iron deficiency anemia, unspecified: Secondary | ICD-10-CM | POA: Diagnosis not present

## 2019-06-01 DIAGNOSIS — N2581 Secondary hyperparathyroidism of renal origin: Secondary | ICD-10-CM | POA: Diagnosis not present

## 2019-06-01 DIAGNOSIS — N186 End stage renal disease: Secondary | ICD-10-CM | POA: Diagnosis not present

## 2019-06-02 DIAGNOSIS — N186 End stage renal disease: Secondary | ICD-10-CM | POA: Diagnosis not present

## 2019-06-02 DIAGNOSIS — Z992 Dependence on renal dialysis: Secondary | ICD-10-CM | POA: Diagnosis not present

## 2019-06-02 DIAGNOSIS — N033 Chronic nephritic syndrome with diffuse mesangial proliferative glomerulonephritis: Secondary | ICD-10-CM | POA: Diagnosis not present

## 2019-06-04 DIAGNOSIS — Z992 Dependence on renal dialysis: Secondary | ICD-10-CM | POA: Diagnosis not present

## 2019-06-04 DIAGNOSIS — D509 Iron deficiency anemia, unspecified: Secondary | ICD-10-CM | POA: Diagnosis not present

## 2019-06-04 DIAGNOSIS — E119 Type 2 diabetes mellitus without complications: Secondary | ICD-10-CM | POA: Diagnosis not present

## 2019-06-04 DIAGNOSIS — N186 End stage renal disease: Secondary | ICD-10-CM | POA: Diagnosis not present

## 2019-06-04 DIAGNOSIS — D631 Anemia in chronic kidney disease: Secondary | ICD-10-CM | POA: Diagnosis not present

## 2019-06-04 DIAGNOSIS — N2581 Secondary hyperparathyroidism of renal origin: Secondary | ICD-10-CM | POA: Diagnosis not present

## 2019-06-06 DIAGNOSIS — D509 Iron deficiency anemia, unspecified: Secondary | ICD-10-CM | POA: Diagnosis not present

## 2019-06-06 DIAGNOSIS — D631 Anemia in chronic kidney disease: Secondary | ICD-10-CM | POA: Diagnosis not present

## 2019-06-06 DIAGNOSIS — Z992 Dependence on renal dialysis: Secondary | ICD-10-CM | POA: Diagnosis not present

## 2019-06-06 DIAGNOSIS — N186 End stage renal disease: Secondary | ICD-10-CM | POA: Diagnosis not present

## 2019-06-06 DIAGNOSIS — E119 Type 2 diabetes mellitus without complications: Secondary | ICD-10-CM | POA: Diagnosis not present

## 2019-06-06 DIAGNOSIS — N2581 Secondary hyperparathyroidism of renal origin: Secondary | ICD-10-CM | POA: Diagnosis not present

## 2019-06-07 DIAGNOSIS — G4733 Obstructive sleep apnea (adult) (pediatric): Secondary | ICD-10-CM | POA: Diagnosis not present

## 2019-06-07 DIAGNOSIS — G894 Chronic pain syndrome: Secondary | ICD-10-CM | POA: Diagnosis not present

## 2019-06-07 DIAGNOSIS — F419 Anxiety disorder, unspecified: Secondary | ICD-10-CM | POA: Diagnosis not present

## 2019-06-07 DIAGNOSIS — Z87891 Personal history of nicotine dependence: Secondary | ICD-10-CM | POA: Diagnosis not present

## 2019-06-07 DIAGNOSIS — M533 Sacrococcygeal disorders, not elsewhere classified: Secondary | ICD-10-CM | POA: Diagnosis not present

## 2019-06-07 DIAGNOSIS — M5136 Other intervertebral disc degeneration, lumbar region: Secondary | ICD-10-CM | POA: Diagnosis not present

## 2019-06-07 DIAGNOSIS — N186 End stage renal disease: Secondary | ICD-10-CM | POA: Diagnosis not present

## 2019-06-07 DIAGNOSIS — M545 Low back pain: Secondary | ICD-10-CM | POA: Diagnosis not present

## 2019-06-07 DIAGNOSIS — J45909 Unspecified asthma, uncomplicated: Secondary | ICD-10-CM | POA: Diagnosis not present

## 2019-06-07 DIAGNOSIS — Z885 Allergy status to narcotic agent status: Secondary | ICD-10-CM | POA: Diagnosis not present

## 2019-06-07 DIAGNOSIS — Z7951 Long term (current) use of inhaled steroids: Secondary | ICD-10-CM | POA: Diagnosis not present

## 2019-06-07 DIAGNOSIS — Z888 Allergy status to other drugs, medicaments and biological substances status: Secondary | ICD-10-CM | POA: Diagnosis not present

## 2019-06-07 DIAGNOSIS — Z992 Dependence on renal dialysis: Secondary | ICD-10-CM | POA: Diagnosis not present

## 2019-06-07 DIAGNOSIS — F329 Major depressive disorder, single episode, unspecified: Secondary | ICD-10-CM | POA: Diagnosis not present

## 2019-06-07 DIAGNOSIS — I120 Hypertensive chronic kidney disease with stage 5 chronic kidney disease or end stage renal disease: Secondary | ICD-10-CM | POA: Diagnosis not present

## 2019-06-07 DIAGNOSIS — Z881 Allergy status to other antibiotic agents status: Secondary | ICD-10-CM | POA: Diagnosis not present

## 2019-06-07 DIAGNOSIS — M7918 Myalgia, other site: Secondary | ICD-10-CM | POA: Diagnosis not present

## 2019-06-08 DIAGNOSIS — E119 Type 2 diabetes mellitus without complications: Secondary | ICD-10-CM | POA: Diagnosis not present

## 2019-06-08 DIAGNOSIS — D631 Anemia in chronic kidney disease: Secondary | ICD-10-CM | POA: Diagnosis not present

## 2019-06-08 DIAGNOSIS — Z992 Dependence on renal dialysis: Secondary | ICD-10-CM | POA: Diagnosis not present

## 2019-06-08 DIAGNOSIS — D509 Iron deficiency anemia, unspecified: Secondary | ICD-10-CM | POA: Diagnosis not present

## 2019-06-08 DIAGNOSIS — N186 End stage renal disease: Secondary | ICD-10-CM | POA: Diagnosis not present

## 2019-06-08 DIAGNOSIS — N2581 Secondary hyperparathyroidism of renal origin: Secondary | ICD-10-CM | POA: Diagnosis not present

## 2019-06-13 DIAGNOSIS — N2581 Secondary hyperparathyroidism of renal origin: Secondary | ICD-10-CM | POA: Diagnosis not present

## 2019-06-13 DIAGNOSIS — Z992 Dependence on renal dialysis: Secondary | ICD-10-CM | POA: Diagnosis not present

## 2019-06-13 DIAGNOSIS — D509 Iron deficiency anemia, unspecified: Secondary | ICD-10-CM | POA: Diagnosis not present

## 2019-06-13 DIAGNOSIS — E119 Type 2 diabetes mellitus without complications: Secondary | ICD-10-CM | POA: Diagnosis not present

## 2019-06-13 DIAGNOSIS — D631 Anemia in chronic kidney disease: Secondary | ICD-10-CM | POA: Diagnosis not present

## 2019-06-13 DIAGNOSIS — N186 End stage renal disease: Secondary | ICD-10-CM | POA: Diagnosis not present

## 2019-06-14 DIAGNOSIS — D509 Iron deficiency anemia, unspecified: Secondary | ICD-10-CM | POA: Diagnosis not present

## 2019-06-14 DIAGNOSIS — N2581 Secondary hyperparathyroidism of renal origin: Secondary | ICD-10-CM | POA: Diagnosis not present

## 2019-06-14 DIAGNOSIS — D631 Anemia in chronic kidney disease: Secondary | ICD-10-CM | POA: Diagnosis not present

## 2019-06-14 DIAGNOSIS — Z992 Dependence on renal dialysis: Secondary | ICD-10-CM | POA: Diagnosis not present

## 2019-06-14 DIAGNOSIS — E119 Type 2 diabetes mellitus without complications: Secondary | ICD-10-CM | POA: Diagnosis not present

## 2019-06-14 DIAGNOSIS — N186 End stage renal disease: Secondary | ICD-10-CM | POA: Diagnosis not present

## 2019-06-15 DIAGNOSIS — E119 Type 2 diabetes mellitus without complications: Secondary | ICD-10-CM | POA: Diagnosis not present

## 2019-06-15 DIAGNOSIS — D631 Anemia in chronic kidney disease: Secondary | ICD-10-CM | POA: Diagnosis not present

## 2019-06-15 DIAGNOSIS — N2581 Secondary hyperparathyroidism of renal origin: Secondary | ICD-10-CM | POA: Diagnosis not present

## 2019-06-15 DIAGNOSIS — Z992 Dependence on renal dialysis: Secondary | ICD-10-CM | POA: Diagnosis not present

## 2019-06-15 DIAGNOSIS — N186 End stage renal disease: Secondary | ICD-10-CM | POA: Diagnosis not present

## 2019-06-15 DIAGNOSIS — D509 Iron deficiency anemia, unspecified: Secondary | ICD-10-CM | POA: Diagnosis not present

## 2019-06-18 DIAGNOSIS — E119 Type 2 diabetes mellitus without complications: Secondary | ICD-10-CM | POA: Diagnosis not present

## 2019-06-18 DIAGNOSIS — Z992 Dependence on renal dialysis: Secondary | ICD-10-CM | POA: Diagnosis not present

## 2019-06-18 DIAGNOSIS — N186 End stage renal disease: Secondary | ICD-10-CM | POA: Diagnosis not present

## 2019-06-18 DIAGNOSIS — N2581 Secondary hyperparathyroidism of renal origin: Secondary | ICD-10-CM | POA: Diagnosis not present

## 2019-06-18 DIAGNOSIS — D631 Anemia in chronic kidney disease: Secondary | ICD-10-CM | POA: Diagnosis not present

## 2019-06-18 DIAGNOSIS — D509 Iron deficiency anemia, unspecified: Secondary | ICD-10-CM | POA: Diagnosis not present

## 2019-06-19 DIAGNOSIS — M5418 Radiculopathy, sacral and sacrococcygeal region: Secondary | ICD-10-CM | POA: Diagnosis not present

## 2019-06-19 DIAGNOSIS — N186 End stage renal disease: Secondary | ICD-10-CM | POA: Diagnosis not present

## 2019-06-19 DIAGNOSIS — M549 Dorsalgia, unspecified: Secondary | ICD-10-CM | POA: Diagnosis not present

## 2019-06-19 DIAGNOSIS — G8918 Other acute postprocedural pain: Secondary | ICD-10-CM | POA: Diagnosis not present

## 2019-06-19 DIAGNOSIS — Z5181 Encounter for therapeutic drug level monitoring: Secondary | ICD-10-CM | POA: Diagnosis not present

## 2019-06-19 DIAGNOSIS — Z79899 Other long term (current) drug therapy: Secondary | ICD-10-CM | POA: Diagnosis not present

## 2019-06-20 DIAGNOSIS — D509 Iron deficiency anemia, unspecified: Secondary | ICD-10-CM | POA: Diagnosis not present

## 2019-06-20 DIAGNOSIS — N2581 Secondary hyperparathyroidism of renal origin: Secondary | ICD-10-CM | POA: Diagnosis not present

## 2019-06-20 DIAGNOSIS — Z992 Dependence on renal dialysis: Secondary | ICD-10-CM | POA: Diagnosis not present

## 2019-06-20 DIAGNOSIS — N186 End stage renal disease: Secondary | ICD-10-CM | POA: Diagnosis not present

## 2019-06-20 DIAGNOSIS — E119 Type 2 diabetes mellitus without complications: Secondary | ICD-10-CM | POA: Diagnosis not present

## 2019-06-20 DIAGNOSIS — D631 Anemia in chronic kidney disease: Secondary | ICD-10-CM | POA: Diagnosis not present

## 2019-06-22 DIAGNOSIS — Z992 Dependence on renal dialysis: Secondary | ICD-10-CM | POA: Diagnosis not present

## 2019-06-22 DIAGNOSIS — D509 Iron deficiency anemia, unspecified: Secondary | ICD-10-CM | POA: Diagnosis not present

## 2019-06-22 DIAGNOSIS — N186 End stage renal disease: Secondary | ICD-10-CM | POA: Diagnosis not present

## 2019-06-22 DIAGNOSIS — D631 Anemia in chronic kidney disease: Secondary | ICD-10-CM | POA: Diagnosis not present

## 2019-06-22 DIAGNOSIS — E119 Type 2 diabetes mellitus without complications: Secondary | ICD-10-CM | POA: Diagnosis not present

## 2019-06-22 DIAGNOSIS — N2581 Secondary hyperparathyroidism of renal origin: Secondary | ICD-10-CM | POA: Diagnosis not present

## 2019-06-25 DIAGNOSIS — N186 End stage renal disease: Secondary | ICD-10-CM | POA: Diagnosis not present

## 2019-06-25 DIAGNOSIS — D631 Anemia in chronic kidney disease: Secondary | ICD-10-CM | POA: Diagnosis not present

## 2019-06-25 DIAGNOSIS — E119 Type 2 diabetes mellitus without complications: Secondary | ICD-10-CM | POA: Diagnosis not present

## 2019-06-25 DIAGNOSIS — D509 Iron deficiency anemia, unspecified: Secondary | ICD-10-CM | POA: Diagnosis not present

## 2019-06-25 DIAGNOSIS — N2581 Secondary hyperparathyroidism of renal origin: Secondary | ICD-10-CM | POA: Diagnosis not present

## 2019-06-25 DIAGNOSIS — Z992 Dependence on renal dialysis: Secondary | ICD-10-CM | POA: Diagnosis not present

## 2019-06-27 DIAGNOSIS — Z992 Dependence on renal dialysis: Secondary | ICD-10-CM | POA: Diagnosis not present

## 2019-06-27 DIAGNOSIS — N186 End stage renal disease: Secondary | ICD-10-CM | POA: Diagnosis not present

## 2019-06-27 DIAGNOSIS — N2581 Secondary hyperparathyroidism of renal origin: Secondary | ICD-10-CM | POA: Diagnosis not present

## 2019-06-27 DIAGNOSIS — D631 Anemia in chronic kidney disease: Secondary | ICD-10-CM | POA: Diagnosis not present

## 2019-06-27 DIAGNOSIS — E119 Type 2 diabetes mellitus without complications: Secondary | ICD-10-CM | POA: Diagnosis not present

## 2019-06-27 DIAGNOSIS — D509 Iron deficiency anemia, unspecified: Secondary | ICD-10-CM | POA: Diagnosis not present

## 2019-06-29 DIAGNOSIS — N2581 Secondary hyperparathyroidism of renal origin: Secondary | ICD-10-CM | POA: Diagnosis not present

## 2019-06-29 DIAGNOSIS — D509 Iron deficiency anemia, unspecified: Secondary | ICD-10-CM | POA: Diagnosis not present

## 2019-06-29 DIAGNOSIS — E119 Type 2 diabetes mellitus without complications: Secondary | ICD-10-CM | POA: Diagnosis not present

## 2019-06-29 DIAGNOSIS — N186 End stage renal disease: Secondary | ICD-10-CM | POA: Diagnosis not present

## 2019-06-29 DIAGNOSIS — Z992 Dependence on renal dialysis: Secondary | ICD-10-CM | POA: Diagnosis not present

## 2019-06-29 DIAGNOSIS — D631 Anemia in chronic kidney disease: Secondary | ICD-10-CM | POA: Diagnosis not present

## 2019-07-03 DIAGNOSIS — N033 Chronic nephritic syndrome with diffuse mesangial proliferative glomerulonephritis: Secondary | ICD-10-CM | POA: Diagnosis not present

## 2019-07-03 DIAGNOSIS — N186 End stage renal disease: Secondary | ICD-10-CM | POA: Diagnosis not present

## 2019-07-03 DIAGNOSIS — Z992 Dependence on renal dialysis: Secondary | ICD-10-CM | POA: Diagnosis not present

## 2019-07-04 DIAGNOSIS — E119 Type 2 diabetes mellitus without complications: Secondary | ICD-10-CM | POA: Diagnosis not present

## 2019-07-04 DIAGNOSIS — N186 End stage renal disease: Secondary | ICD-10-CM | POA: Diagnosis not present

## 2019-07-04 DIAGNOSIS — N2581 Secondary hyperparathyroidism of renal origin: Secondary | ICD-10-CM | POA: Diagnosis not present

## 2019-07-04 DIAGNOSIS — D631 Anemia in chronic kidney disease: Secondary | ICD-10-CM | POA: Diagnosis not present

## 2019-07-04 DIAGNOSIS — Z992 Dependence on renal dialysis: Secondary | ICD-10-CM | POA: Diagnosis not present

## 2019-07-06 DIAGNOSIS — E119 Type 2 diabetes mellitus without complications: Secondary | ICD-10-CM | POA: Diagnosis not present

## 2019-07-06 DIAGNOSIS — N2581 Secondary hyperparathyroidism of renal origin: Secondary | ICD-10-CM | POA: Diagnosis not present

## 2019-07-06 DIAGNOSIS — D631 Anemia in chronic kidney disease: Secondary | ICD-10-CM | POA: Diagnosis not present

## 2019-07-06 DIAGNOSIS — N186 End stage renal disease: Secondary | ICD-10-CM | POA: Diagnosis not present

## 2019-07-06 DIAGNOSIS — Z992 Dependence on renal dialysis: Secondary | ICD-10-CM | POA: Diagnosis not present

## 2019-07-09 DIAGNOSIS — Z992 Dependence on renal dialysis: Secondary | ICD-10-CM | POA: Diagnosis not present

## 2019-07-09 DIAGNOSIS — N186 End stage renal disease: Secondary | ICD-10-CM | POA: Diagnosis not present

## 2019-07-09 DIAGNOSIS — N2581 Secondary hyperparathyroidism of renal origin: Secondary | ICD-10-CM | POA: Diagnosis not present

## 2019-07-09 DIAGNOSIS — E119 Type 2 diabetes mellitus without complications: Secondary | ICD-10-CM | POA: Diagnosis not present

## 2019-07-09 DIAGNOSIS — D631 Anemia in chronic kidney disease: Secondary | ICD-10-CM | POA: Diagnosis not present

## 2019-07-11 DIAGNOSIS — D631 Anemia in chronic kidney disease: Secondary | ICD-10-CM | POA: Diagnosis not present

## 2019-07-11 DIAGNOSIS — N186 End stage renal disease: Secondary | ICD-10-CM | POA: Diagnosis not present

## 2019-07-11 DIAGNOSIS — E119 Type 2 diabetes mellitus without complications: Secondary | ICD-10-CM | POA: Diagnosis not present

## 2019-07-11 DIAGNOSIS — Z992 Dependence on renal dialysis: Secondary | ICD-10-CM | POA: Diagnosis not present

## 2019-07-11 DIAGNOSIS — N2581 Secondary hyperparathyroidism of renal origin: Secondary | ICD-10-CM | POA: Diagnosis not present

## 2019-07-13 DIAGNOSIS — N2581 Secondary hyperparathyroidism of renal origin: Secondary | ICD-10-CM | POA: Diagnosis not present

## 2019-07-13 DIAGNOSIS — E119 Type 2 diabetes mellitus without complications: Secondary | ICD-10-CM | POA: Diagnosis not present

## 2019-07-13 DIAGNOSIS — N186 End stage renal disease: Secondary | ICD-10-CM | POA: Diagnosis not present

## 2019-07-13 DIAGNOSIS — Z992 Dependence on renal dialysis: Secondary | ICD-10-CM | POA: Diagnosis not present

## 2019-07-13 DIAGNOSIS — D631 Anemia in chronic kidney disease: Secondary | ICD-10-CM | POA: Diagnosis not present

## 2019-07-16 DIAGNOSIS — D631 Anemia in chronic kidney disease: Secondary | ICD-10-CM | POA: Diagnosis not present

## 2019-07-16 DIAGNOSIS — Z992 Dependence on renal dialysis: Secondary | ICD-10-CM | POA: Diagnosis not present

## 2019-07-16 DIAGNOSIS — N2581 Secondary hyperparathyroidism of renal origin: Secondary | ICD-10-CM | POA: Diagnosis not present

## 2019-07-16 DIAGNOSIS — N186 End stage renal disease: Secondary | ICD-10-CM | POA: Diagnosis not present

## 2019-07-16 DIAGNOSIS — E119 Type 2 diabetes mellitus without complications: Secondary | ICD-10-CM | POA: Diagnosis not present

## 2019-07-20 DIAGNOSIS — N186 End stage renal disease: Secondary | ICD-10-CM | POA: Diagnosis not present

## 2019-07-20 DIAGNOSIS — N2581 Secondary hyperparathyroidism of renal origin: Secondary | ICD-10-CM | POA: Diagnosis not present

## 2019-07-20 DIAGNOSIS — D631 Anemia in chronic kidney disease: Secondary | ICD-10-CM | POA: Diagnosis not present

## 2019-07-20 DIAGNOSIS — E119 Type 2 diabetes mellitus without complications: Secondary | ICD-10-CM | POA: Diagnosis not present

## 2019-07-20 DIAGNOSIS — Z992 Dependence on renal dialysis: Secondary | ICD-10-CM | POA: Diagnosis not present

## 2019-07-23 DIAGNOSIS — N2581 Secondary hyperparathyroidism of renal origin: Secondary | ICD-10-CM | POA: Diagnosis not present

## 2019-07-23 DIAGNOSIS — Z992 Dependence on renal dialysis: Secondary | ICD-10-CM | POA: Diagnosis not present

## 2019-07-23 DIAGNOSIS — D631 Anemia in chronic kidney disease: Secondary | ICD-10-CM | POA: Diagnosis not present

## 2019-07-23 DIAGNOSIS — E119 Type 2 diabetes mellitus without complications: Secondary | ICD-10-CM | POA: Diagnosis not present

## 2019-07-23 DIAGNOSIS — N186 End stage renal disease: Secondary | ICD-10-CM | POA: Diagnosis not present

## 2019-07-25 DIAGNOSIS — D631 Anemia in chronic kidney disease: Secondary | ICD-10-CM | POA: Diagnosis not present

## 2019-07-25 DIAGNOSIS — E119 Type 2 diabetes mellitus without complications: Secondary | ICD-10-CM | POA: Diagnosis not present

## 2019-07-25 DIAGNOSIS — N2581 Secondary hyperparathyroidism of renal origin: Secondary | ICD-10-CM | POA: Diagnosis not present

## 2019-07-25 DIAGNOSIS — Z992 Dependence on renal dialysis: Secondary | ICD-10-CM | POA: Diagnosis not present

## 2019-07-25 DIAGNOSIS — N186 End stage renal disease: Secondary | ICD-10-CM | POA: Diagnosis not present

## 2019-07-27 DIAGNOSIS — N2581 Secondary hyperparathyroidism of renal origin: Secondary | ICD-10-CM | POA: Diagnosis not present

## 2019-07-27 DIAGNOSIS — E119 Type 2 diabetes mellitus without complications: Secondary | ICD-10-CM | POA: Diagnosis not present

## 2019-07-27 DIAGNOSIS — D631 Anemia in chronic kidney disease: Secondary | ICD-10-CM | POA: Diagnosis not present

## 2019-07-27 DIAGNOSIS — Z992 Dependence on renal dialysis: Secondary | ICD-10-CM | POA: Diagnosis not present

## 2019-07-27 DIAGNOSIS — N186 End stage renal disease: Secondary | ICD-10-CM | POA: Diagnosis not present

## 2019-07-30 DIAGNOSIS — E119 Type 2 diabetes mellitus without complications: Secondary | ICD-10-CM | POA: Diagnosis not present

## 2019-07-30 DIAGNOSIS — N186 End stage renal disease: Secondary | ICD-10-CM | POA: Diagnosis not present

## 2019-07-30 DIAGNOSIS — Z992 Dependence on renal dialysis: Secondary | ICD-10-CM | POA: Diagnosis not present

## 2019-07-30 DIAGNOSIS — D631 Anemia in chronic kidney disease: Secondary | ICD-10-CM | POA: Diagnosis not present

## 2019-07-30 DIAGNOSIS — N2581 Secondary hyperparathyroidism of renal origin: Secondary | ICD-10-CM | POA: Diagnosis not present

## 2019-08-01 DIAGNOSIS — E119 Type 2 diabetes mellitus without complications: Secondary | ICD-10-CM | POA: Diagnosis not present

## 2019-08-01 DIAGNOSIS — N2581 Secondary hyperparathyroidism of renal origin: Secondary | ICD-10-CM | POA: Diagnosis not present

## 2019-08-01 DIAGNOSIS — N186 End stage renal disease: Secondary | ICD-10-CM | POA: Diagnosis not present

## 2019-08-01 DIAGNOSIS — D631 Anemia in chronic kidney disease: Secondary | ICD-10-CM | POA: Diagnosis not present

## 2019-08-01 DIAGNOSIS — Z992 Dependence on renal dialysis: Secondary | ICD-10-CM | POA: Diagnosis not present

## 2019-08-02 DIAGNOSIS — M545 Low back pain: Secondary | ICD-10-CM | POA: Diagnosis not present

## 2019-08-02 DIAGNOSIS — Z91018 Allergy to other foods: Secondary | ICD-10-CM | POA: Diagnosis not present

## 2019-08-02 DIAGNOSIS — I120 Hypertensive chronic kidney disease with stage 5 chronic kidney disease or end stage renal disease: Secondary | ICD-10-CM | POA: Diagnosis not present

## 2019-08-02 DIAGNOSIS — Z992 Dependence on renal dialysis: Secondary | ICD-10-CM | POA: Diagnosis not present

## 2019-08-02 DIAGNOSIS — N186 End stage renal disease: Secondary | ICD-10-CM | POA: Diagnosis not present

## 2019-08-02 DIAGNOSIS — G894 Chronic pain syndrome: Secondary | ICD-10-CM | POA: Diagnosis not present

## 2019-08-02 DIAGNOSIS — M7918 Myalgia, other site: Secondary | ICD-10-CM | POA: Diagnosis not present

## 2019-08-02 DIAGNOSIS — E119 Type 2 diabetes mellitus without complications: Secondary | ICD-10-CM | POA: Diagnosis not present

## 2019-08-02 DIAGNOSIS — D631 Anemia in chronic kidney disease: Secondary | ICD-10-CM | POA: Diagnosis not present

## 2019-08-02 DIAGNOSIS — N033 Chronic nephritic syndrome with diffuse mesangial proliferative glomerulonephritis: Secondary | ICD-10-CM | POA: Diagnosis not present

## 2019-08-02 DIAGNOSIS — F329 Major depressive disorder, single episode, unspecified: Secondary | ICD-10-CM | POA: Diagnosis not present

## 2019-08-02 DIAGNOSIS — Z881 Allergy status to other antibiotic agents status: Secondary | ICD-10-CM | POA: Diagnosis not present

## 2019-08-02 DIAGNOSIS — Z23 Encounter for immunization: Secondary | ICD-10-CM | POA: Diagnosis not present

## 2019-08-02 DIAGNOSIS — Z884 Allergy status to anesthetic agent status: Secondary | ICD-10-CM | POA: Diagnosis not present

## 2019-08-02 DIAGNOSIS — J45909 Unspecified asthma, uncomplicated: Secondary | ICD-10-CM | POA: Diagnosis not present

## 2019-08-02 DIAGNOSIS — G4733 Obstructive sleep apnea (adult) (pediatric): Secondary | ICD-10-CM | POA: Diagnosis not present

## 2019-08-02 DIAGNOSIS — Z885 Allergy status to narcotic agent status: Secondary | ICD-10-CM | POA: Diagnosis not present

## 2019-08-02 DIAGNOSIS — N2581 Secondary hyperparathyroidism of renal origin: Secondary | ICD-10-CM | POA: Diagnosis not present

## 2019-08-02 DIAGNOSIS — Z87891 Personal history of nicotine dependence: Secondary | ICD-10-CM | POA: Diagnosis not present

## 2019-08-02 DIAGNOSIS — M5116 Intervertebral disc disorders with radiculopathy, lumbar region: Secondary | ICD-10-CM | POA: Diagnosis not present

## 2019-08-03 DIAGNOSIS — Z992 Dependence on renal dialysis: Secondary | ICD-10-CM | POA: Diagnosis not present

## 2019-08-03 DIAGNOSIS — N2581 Secondary hyperparathyroidism of renal origin: Secondary | ICD-10-CM | POA: Diagnosis not present

## 2019-08-03 DIAGNOSIS — Z23 Encounter for immunization: Secondary | ICD-10-CM | POA: Diagnosis not present

## 2019-08-03 DIAGNOSIS — D631 Anemia in chronic kidney disease: Secondary | ICD-10-CM | POA: Diagnosis not present

## 2019-08-03 DIAGNOSIS — N186 End stage renal disease: Secondary | ICD-10-CM | POA: Diagnosis not present

## 2019-08-03 DIAGNOSIS — E119 Type 2 diabetes mellitus without complications: Secondary | ICD-10-CM | POA: Diagnosis not present

## 2019-08-06 DIAGNOSIS — N186 End stage renal disease: Secondary | ICD-10-CM | POA: Diagnosis not present

## 2019-08-06 DIAGNOSIS — N2581 Secondary hyperparathyroidism of renal origin: Secondary | ICD-10-CM | POA: Diagnosis not present

## 2019-08-06 DIAGNOSIS — Z992 Dependence on renal dialysis: Secondary | ICD-10-CM | POA: Diagnosis not present

## 2019-08-06 DIAGNOSIS — D631 Anemia in chronic kidney disease: Secondary | ICD-10-CM | POA: Diagnosis not present

## 2019-08-06 DIAGNOSIS — E119 Type 2 diabetes mellitus without complications: Secondary | ICD-10-CM | POA: Diagnosis not present

## 2019-08-06 DIAGNOSIS — Z23 Encounter for immunization: Secondary | ICD-10-CM | POA: Diagnosis not present

## 2019-08-10 DIAGNOSIS — Z992 Dependence on renal dialysis: Secondary | ICD-10-CM | POA: Diagnosis not present

## 2019-08-10 DIAGNOSIS — Z23 Encounter for immunization: Secondary | ICD-10-CM | POA: Diagnosis not present

## 2019-08-10 DIAGNOSIS — D631 Anemia in chronic kidney disease: Secondary | ICD-10-CM | POA: Diagnosis not present

## 2019-08-10 DIAGNOSIS — N2581 Secondary hyperparathyroidism of renal origin: Secondary | ICD-10-CM | POA: Diagnosis not present

## 2019-08-10 DIAGNOSIS — E119 Type 2 diabetes mellitus without complications: Secondary | ICD-10-CM | POA: Diagnosis not present

## 2019-08-10 DIAGNOSIS — N186 End stage renal disease: Secondary | ICD-10-CM | POA: Diagnosis not present

## 2019-08-13 DIAGNOSIS — D631 Anemia in chronic kidney disease: Secondary | ICD-10-CM | POA: Diagnosis not present

## 2019-08-13 DIAGNOSIS — Z23 Encounter for immunization: Secondary | ICD-10-CM | POA: Diagnosis not present

## 2019-08-13 DIAGNOSIS — Z992 Dependence on renal dialysis: Secondary | ICD-10-CM | POA: Diagnosis not present

## 2019-08-13 DIAGNOSIS — N186 End stage renal disease: Secondary | ICD-10-CM | POA: Diagnosis not present

## 2019-08-13 DIAGNOSIS — N2581 Secondary hyperparathyroidism of renal origin: Secondary | ICD-10-CM | POA: Diagnosis not present

## 2019-08-13 DIAGNOSIS — E119 Type 2 diabetes mellitus without complications: Secondary | ICD-10-CM | POA: Diagnosis not present

## 2019-08-14 DIAGNOSIS — I10 Essential (primary) hypertension: Secondary | ICD-10-CM | POA: Diagnosis not present

## 2019-08-14 DIAGNOSIS — F418 Other specified anxiety disorders: Secondary | ICD-10-CM | POA: Diagnosis not present

## 2019-08-14 DIAGNOSIS — G43109 Migraine with aura, not intractable, without status migrainosus: Secondary | ICD-10-CM | POA: Diagnosis not present

## 2019-08-15 DIAGNOSIS — E119 Type 2 diabetes mellitus without complications: Secondary | ICD-10-CM | POA: Diagnosis not present

## 2019-08-15 DIAGNOSIS — N186 End stage renal disease: Secondary | ICD-10-CM | POA: Diagnosis not present

## 2019-08-15 DIAGNOSIS — N2581 Secondary hyperparathyroidism of renal origin: Secondary | ICD-10-CM | POA: Diagnosis not present

## 2019-08-15 DIAGNOSIS — Z23 Encounter for immunization: Secondary | ICD-10-CM | POA: Diagnosis not present

## 2019-08-15 DIAGNOSIS — Z992 Dependence on renal dialysis: Secondary | ICD-10-CM | POA: Diagnosis not present

## 2019-08-15 DIAGNOSIS — D631 Anemia in chronic kidney disease: Secondary | ICD-10-CM | POA: Diagnosis not present

## 2019-08-16 DIAGNOSIS — M5417 Radiculopathy, lumbosacral region: Secondary | ICD-10-CM | POA: Diagnosis not present

## 2019-08-16 DIAGNOSIS — G894 Chronic pain syndrome: Secondary | ICD-10-CM | POA: Diagnosis not present

## 2019-08-22 DIAGNOSIS — Z992 Dependence on renal dialysis: Secondary | ICD-10-CM | POA: Diagnosis not present

## 2019-08-22 DIAGNOSIS — E119 Type 2 diabetes mellitus without complications: Secondary | ICD-10-CM | POA: Diagnosis not present

## 2019-08-22 DIAGNOSIS — N186 End stage renal disease: Secondary | ICD-10-CM | POA: Diagnosis not present

## 2019-08-22 DIAGNOSIS — D631 Anemia in chronic kidney disease: Secondary | ICD-10-CM | POA: Diagnosis not present

## 2019-08-22 DIAGNOSIS — N2581 Secondary hyperparathyroidism of renal origin: Secondary | ICD-10-CM | POA: Diagnosis not present

## 2019-08-22 DIAGNOSIS — Z23 Encounter for immunization: Secondary | ICD-10-CM | POA: Diagnosis not present

## 2019-08-27 DIAGNOSIS — Z23 Encounter for immunization: Secondary | ICD-10-CM | POA: Diagnosis not present

## 2019-08-27 DIAGNOSIS — N186 End stage renal disease: Secondary | ICD-10-CM | POA: Diagnosis not present

## 2019-08-27 DIAGNOSIS — D631 Anemia in chronic kidney disease: Secondary | ICD-10-CM | POA: Diagnosis not present

## 2019-08-27 DIAGNOSIS — Z992 Dependence on renal dialysis: Secondary | ICD-10-CM | POA: Diagnosis not present

## 2019-08-27 DIAGNOSIS — N2581 Secondary hyperparathyroidism of renal origin: Secondary | ICD-10-CM | POA: Diagnosis not present

## 2019-08-27 DIAGNOSIS — E119 Type 2 diabetes mellitus without complications: Secondary | ICD-10-CM | POA: Diagnosis not present

## 2019-08-29 DIAGNOSIS — D631 Anemia in chronic kidney disease: Secondary | ICD-10-CM | POA: Diagnosis not present

## 2019-08-29 DIAGNOSIS — N2581 Secondary hyperparathyroidism of renal origin: Secondary | ICD-10-CM | POA: Diagnosis not present

## 2019-08-29 DIAGNOSIS — E119 Type 2 diabetes mellitus without complications: Secondary | ICD-10-CM | POA: Diagnosis not present

## 2019-08-29 DIAGNOSIS — Z23 Encounter for immunization: Secondary | ICD-10-CM | POA: Diagnosis not present

## 2019-08-29 DIAGNOSIS — N186 End stage renal disease: Secondary | ICD-10-CM | POA: Diagnosis not present

## 2019-08-29 DIAGNOSIS — Z992 Dependence on renal dialysis: Secondary | ICD-10-CM | POA: Diagnosis not present

## 2019-08-31 DIAGNOSIS — E119 Type 2 diabetes mellitus without complications: Secondary | ICD-10-CM | POA: Diagnosis not present

## 2019-08-31 DIAGNOSIS — Z992 Dependence on renal dialysis: Secondary | ICD-10-CM | POA: Diagnosis not present

## 2019-08-31 DIAGNOSIS — Z23 Encounter for immunization: Secondary | ICD-10-CM | POA: Diagnosis not present

## 2019-08-31 DIAGNOSIS — N2581 Secondary hyperparathyroidism of renal origin: Secondary | ICD-10-CM | POA: Diagnosis not present

## 2019-08-31 DIAGNOSIS — D631 Anemia in chronic kidney disease: Secondary | ICD-10-CM | POA: Diagnosis not present

## 2019-08-31 DIAGNOSIS — N186 End stage renal disease: Secondary | ICD-10-CM | POA: Diagnosis not present

## 2019-09-02 DIAGNOSIS — N033 Chronic nephritic syndrome with diffuse mesangial proliferative glomerulonephritis: Secondary | ICD-10-CM | POA: Diagnosis not present

## 2019-09-02 DIAGNOSIS — Z992 Dependence on renal dialysis: Secondary | ICD-10-CM | POA: Diagnosis not present

## 2019-09-02 DIAGNOSIS — N186 End stage renal disease: Secondary | ICD-10-CM | POA: Diagnosis not present

## 2019-09-03 DIAGNOSIS — N186 End stage renal disease: Secondary | ICD-10-CM | POA: Diagnosis not present

## 2019-09-03 DIAGNOSIS — D631 Anemia in chronic kidney disease: Secondary | ICD-10-CM | POA: Diagnosis not present

## 2019-09-03 DIAGNOSIS — Z992 Dependence on renal dialysis: Secondary | ICD-10-CM | POA: Diagnosis not present

## 2019-09-03 DIAGNOSIS — E119 Type 2 diabetes mellitus without complications: Secondary | ICD-10-CM | POA: Diagnosis not present

## 2019-09-03 DIAGNOSIS — N2581 Secondary hyperparathyroidism of renal origin: Secondary | ICD-10-CM | POA: Diagnosis not present

## 2019-09-05 DIAGNOSIS — N2581 Secondary hyperparathyroidism of renal origin: Secondary | ICD-10-CM | POA: Diagnosis not present

## 2019-09-05 DIAGNOSIS — Z992 Dependence on renal dialysis: Secondary | ICD-10-CM | POA: Diagnosis not present

## 2019-09-05 DIAGNOSIS — E119 Type 2 diabetes mellitus without complications: Secondary | ICD-10-CM | POA: Diagnosis not present

## 2019-09-05 DIAGNOSIS — N186 End stage renal disease: Secondary | ICD-10-CM | POA: Diagnosis not present

## 2019-09-05 DIAGNOSIS — D631 Anemia in chronic kidney disease: Secondary | ICD-10-CM | POA: Diagnosis not present

## 2019-09-07 DIAGNOSIS — N2581 Secondary hyperparathyroidism of renal origin: Secondary | ICD-10-CM | POA: Diagnosis not present

## 2019-09-07 DIAGNOSIS — N186 End stage renal disease: Secondary | ICD-10-CM | POA: Diagnosis not present

## 2019-09-07 DIAGNOSIS — E119 Type 2 diabetes mellitus without complications: Secondary | ICD-10-CM | POA: Diagnosis not present

## 2019-09-07 DIAGNOSIS — D631 Anemia in chronic kidney disease: Secondary | ICD-10-CM | POA: Diagnosis not present

## 2019-09-07 DIAGNOSIS — Z992 Dependence on renal dialysis: Secondary | ICD-10-CM | POA: Diagnosis not present

## 2019-09-10 DIAGNOSIS — D631 Anemia in chronic kidney disease: Secondary | ICD-10-CM | POA: Diagnosis not present

## 2019-09-10 DIAGNOSIS — E119 Type 2 diabetes mellitus without complications: Secondary | ICD-10-CM | POA: Diagnosis not present

## 2019-09-10 DIAGNOSIS — Z992 Dependence on renal dialysis: Secondary | ICD-10-CM | POA: Diagnosis not present

## 2019-09-10 DIAGNOSIS — N186 End stage renal disease: Secondary | ICD-10-CM | POA: Diagnosis not present

## 2019-09-10 DIAGNOSIS — N2581 Secondary hyperparathyroidism of renal origin: Secondary | ICD-10-CM | POA: Diagnosis not present

## 2019-09-14 DIAGNOSIS — E119 Type 2 diabetes mellitus without complications: Secondary | ICD-10-CM | POA: Diagnosis not present

## 2019-09-14 DIAGNOSIS — N186 End stage renal disease: Secondary | ICD-10-CM | POA: Diagnosis not present

## 2019-09-14 DIAGNOSIS — Z992 Dependence on renal dialysis: Secondary | ICD-10-CM | POA: Diagnosis not present

## 2019-09-14 DIAGNOSIS — D631 Anemia in chronic kidney disease: Secondary | ICD-10-CM | POA: Diagnosis not present

## 2019-09-14 DIAGNOSIS — N2581 Secondary hyperparathyroidism of renal origin: Secondary | ICD-10-CM | POA: Diagnosis not present

## 2019-09-17 DIAGNOSIS — E119 Type 2 diabetes mellitus without complications: Secondary | ICD-10-CM | POA: Diagnosis not present

## 2019-09-17 DIAGNOSIS — N186 End stage renal disease: Secondary | ICD-10-CM | POA: Diagnosis not present

## 2019-09-17 DIAGNOSIS — Z992 Dependence on renal dialysis: Secondary | ICD-10-CM | POA: Diagnosis not present

## 2019-09-17 DIAGNOSIS — N2581 Secondary hyperparathyroidism of renal origin: Secondary | ICD-10-CM | POA: Diagnosis not present

## 2019-09-17 DIAGNOSIS — D631 Anemia in chronic kidney disease: Secondary | ICD-10-CM | POA: Diagnosis not present

## 2019-09-19 DIAGNOSIS — N2581 Secondary hyperparathyroidism of renal origin: Secondary | ICD-10-CM | POA: Diagnosis not present

## 2019-09-19 DIAGNOSIS — N186 End stage renal disease: Secondary | ICD-10-CM | POA: Diagnosis not present

## 2019-09-19 DIAGNOSIS — E119 Type 2 diabetes mellitus without complications: Secondary | ICD-10-CM | POA: Diagnosis not present

## 2019-09-19 DIAGNOSIS — Z992 Dependence on renal dialysis: Secondary | ICD-10-CM | POA: Diagnosis not present

## 2019-09-19 DIAGNOSIS — D631 Anemia in chronic kidney disease: Secondary | ICD-10-CM | POA: Diagnosis not present

## 2019-09-21 DIAGNOSIS — E119 Type 2 diabetes mellitus without complications: Secondary | ICD-10-CM | POA: Diagnosis not present

## 2019-09-21 DIAGNOSIS — Z992 Dependence on renal dialysis: Secondary | ICD-10-CM | POA: Diagnosis not present

## 2019-09-21 DIAGNOSIS — N2581 Secondary hyperparathyroidism of renal origin: Secondary | ICD-10-CM | POA: Diagnosis not present

## 2019-09-21 DIAGNOSIS — D631 Anemia in chronic kidney disease: Secondary | ICD-10-CM | POA: Diagnosis not present

## 2019-09-21 DIAGNOSIS — N186 End stage renal disease: Secondary | ICD-10-CM | POA: Diagnosis not present

## 2019-09-26 DIAGNOSIS — E119 Type 2 diabetes mellitus without complications: Secondary | ICD-10-CM | POA: Diagnosis not present

## 2019-09-26 DIAGNOSIS — Z992 Dependence on renal dialysis: Secondary | ICD-10-CM | POA: Diagnosis not present

## 2019-09-26 DIAGNOSIS — R05 Cough: Secondary | ICD-10-CM | POA: Diagnosis not present

## 2019-09-26 DIAGNOSIS — D631 Anemia in chronic kidney disease: Secondary | ICD-10-CM | POA: Diagnosis not present

## 2019-09-26 DIAGNOSIS — R112 Nausea with vomiting, unspecified: Secondary | ICD-10-CM | POA: Diagnosis not present

## 2019-09-26 DIAGNOSIS — N2581 Secondary hyperparathyroidism of renal origin: Secondary | ICD-10-CM | POA: Diagnosis not present

## 2019-09-26 DIAGNOSIS — N186 End stage renal disease: Secondary | ICD-10-CM | POA: Diagnosis not present

## 2019-09-28 DIAGNOSIS — E119 Type 2 diabetes mellitus without complications: Secondary | ICD-10-CM | POA: Diagnosis not present

## 2019-09-28 DIAGNOSIS — R1110 Vomiting, unspecified: Secondary | ICD-10-CM | POA: Diagnosis not present

## 2019-09-28 DIAGNOSIS — R05 Cough: Secondary | ICD-10-CM | POA: Diagnosis not present

## 2019-09-28 DIAGNOSIS — Z992 Dependence on renal dialysis: Secondary | ICD-10-CM | POA: Diagnosis not present

## 2019-09-28 DIAGNOSIS — N186 End stage renal disease: Secondary | ICD-10-CM | POA: Diagnosis not present

## 2019-09-28 DIAGNOSIS — D631 Anemia in chronic kidney disease: Secondary | ICD-10-CM | POA: Diagnosis not present

## 2019-09-28 DIAGNOSIS — N2581 Secondary hyperparathyroidism of renal origin: Secondary | ICD-10-CM | POA: Diagnosis not present

## 2019-10-11 DIAGNOSIS — M549 Dorsalgia, unspecified: Secondary | ICD-10-CM | POA: Diagnosis not present

## 2019-10-11 DIAGNOSIS — M5417 Radiculopathy, lumbosacral region: Secondary | ICD-10-CM | POA: Diagnosis not present

## 2019-10-11 DIAGNOSIS — G8918 Other acute postprocedural pain: Secondary | ICD-10-CM | POA: Diagnosis not present

## 2019-10-11 DIAGNOSIS — G894 Chronic pain syndrome: Secondary | ICD-10-CM | POA: Diagnosis not present

## 2020-01-29 ENCOUNTER — Other Ambulatory Visit: Payer: Self-pay

## 2020-01-29 ENCOUNTER — Encounter (HOSPITAL_COMMUNITY): Payer: Self-pay | Admitting: Emergency Medicine

## 2020-01-29 ENCOUNTER — Emergency Department (HOSPITAL_COMMUNITY)
Admission: EM | Admit: 2020-01-29 | Discharge: 2020-01-29 | Disposition: A | Discharge status: Home / Self Care | Payer: Medicare Other | Attending: Emergency Medicine | Admitting: Emergency Medicine

## 2020-01-29 ENCOUNTER — Emergency Department (HOSPITAL_COMMUNITY): Payer: Medicare Other

## 2020-01-29 DIAGNOSIS — R0789 Other chest pain: Secondary | ICD-10-CM | POA: Diagnosis not present

## 2020-01-29 DIAGNOSIS — Z87891 Personal history of nicotine dependence: Secondary | ICD-10-CM | POA: Diagnosis not present

## 2020-01-29 DIAGNOSIS — J45909 Unspecified asthma, uncomplicated: Secondary | ICD-10-CM | POA: Diagnosis not present

## 2020-01-29 DIAGNOSIS — R0602 Shortness of breath: Secondary | ICD-10-CM | POA: Insufficient documentation

## 2020-01-29 DIAGNOSIS — Z992 Dependence on renal dialysis: Secondary | ICD-10-CM | POA: Insufficient documentation

## 2020-01-29 DIAGNOSIS — Z79899 Other long term (current) drug therapy: Secondary | ICD-10-CM | POA: Insufficient documentation

## 2020-01-29 DIAGNOSIS — I120 Hypertensive chronic kidney disease with stage 5 chronic kidney disease or end stage renal disease: Secondary | ICD-10-CM | POA: Insufficient documentation

## 2020-01-29 DIAGNOSIS — N186 End stage renal disease: Secondary | ICD-10-CM | POA: Diagnosis not present

## 2020-01-29 DIAGNOSIS — R079 Chest pain, unspecified: Secondary | ICD-10-CM

## 2020-01-29 LAB — BASIC METABOLIC PANEL
Anion gap: 22 — ABNORMAL HIGH (ref 5–15)
BUN: 86 mg/dL — ABNORMAL HIGH (ref 6–20)
CO2: 21 mmol/L — ABNORMAL LOW (ref 22–32)
Calcium: 8.7 mg/dL — ABNORMAL LOW (ref 8.9–10.3)
Chloride: 96 mmol/L — ABNORMAL LOW (ref 98–111)
Creatinine, Ser: 10.26 mg/dL — ABNORMAL HIGH (ref 0.44–1.00)
GFR calc Af Amer: 5 mL/min — ABNORMAL LOW (ref >60)
GFR calc non Af Amer: 4 mL/min — ABNORMAL LOW (ref >60)
Glucose, Bld: 127 mg/dL — ABNORMAL HIGH (ref 70–99)
Potassium: 4.4 mmol/L (ref 3.5–5.1)
Sodium: 139 mmol/L (ref 135–145)

## 2020-01-29 LAB — CBC
HCT: 23.8 % — ABNORMAL LOW (ref 36.0–46.0)
Hemoglobin: 7.3 g/dL — ABNORMAL LOW (ref 12.0–15.0)
MCH: 32.9 pg (ref 26.0–34.0)
MCHC: 30.7 g/dL (ref 30.0–36.0)
MCV: 107.2 fL — ABNORMAL HIGH (ref 80.0–100.0)
Platelets: 234 10*3/uL (ref 150–400)
RBC: 2.22 MIL/uL — ABNORMAL LOW (ref 3.87–5.11)
RDW: 14.8 % (ref 11.5–15.5)
WBC: 8.4 10*3/uL (ref 4.0–10.5)
nRBC: 0 % (ref 0.0–0.2)

## 2020-01-29 LAB — BRAIN NATRIURETIC PEPTIDE: B Natriuretic Peptide: 780.2 pg/mL — ABNORMAL HIGH (ref 0.0–100.0)

## 2020-01-29 LAB — TROPONIN I (HIGH SENSITIVITY)
Troponin I (High Sensitivity): 8 ng/L (ref <18)
Troponin I (High Sensitivity): 7 ng/L (ref <18)

## 2020-01-29 MED ORDER — CHLORHEXIDINE GLUCONATE CLOTH 2 % EX PADS: 6.0000 | MEDICATED_PAD | Freq: Every day | CUTANEOUS | Status: DC

## 2020-01-29 MED ORDER — PROCHLORPERAZINE EDISYLATE 10 MG/2ML IJ SOLN
10.0000 mg | Freq: Once | INTRAMUSCULAR | Status: AC
  Administered 2020-01-29: 10 mg via INTRAVENOUS
  Filled 2020-01-29: qty 2

## 2020-01-29 MED ORDER — ALBUTEROL SULFATE HFA 108 (90 BASE) MCG/ACT IN AERS
8.0000 | INHALATION_SPRAY | Freq: Once | RESPIRATORY_TRACT | Status: AC
  Administered 2020-01-29: 12:00:00 8 via RESPIRATORY_TRACT
  Filled 2020-01-29: qty 6.7

## 2020-01-29 MED ORDER — AEROCHAMBER PLUS FLO-VU LARGE MISC: 1.0000 | Freq: Once | Status: DC

## 2020-01-29 NOTE — ED Notes (Signed)
Pt family member came to front desk saying pt is [facility]. Pt SpO2 sat was noted to be at 86% on room air. Pt endorses that when she is in dialysis, they place her on oxygen but denies regular O2 dependence. Pt was placed on 2L [place] and O2 sat is currently 93%. RN notified.

## 2020-01-29 NOTE — ED Triage Notes (Signed)
Pt endorses migraine, CP and SOB since yesterday. Last HD Friday.

## 2020-01-29 NOTE — ED Provider Notes (Signed)
Pt signed out to me by C Aberman, PA-C. Please see previous notes for further history.   In brief, patient presented for evaluation chest pain, shortness of breath, migraine.  Migraine resolved with medications.  Patient's chest pain and shortness of breath is thought to be due to needing dialysis.  Patient goes Monday, Wednesday, Friday, she missed yesterday, so her last session was 4 days ago.  Nephrology consulted, recommends dialysis tonight in the hospital with plan for discharge afterwards.  She is pending a Covid test.  Patient requesting to leave.  She states she can go to dialysis tomorrow.  Nephrology was consulted, they are agreeable with discharge as long as patient goes to dialysis tomorrow.  Stressed importance that she goes, and prompt return to the ER with any worsening symptoms.  She is not on oxygen, satting at 98% in no distress.  As such, I am agreeable to discharge.  Patient states he understands and agrees plan.     Franchot Heidelberg, PA-C 01/29/20 1644    Noemi Chapel, MD 01/30/20 1158

## 2020-01-29 NOTE — ED Notes (Signed)
Pt requesting to go home, states she has a dialysis appointment tomorrow morning. Paged nephrology, states pt is okay for d/c at this time. [place], PA aware.

## 2020-01-29 NOTE — ED Provider Notes (Signed)
[facility] EMERGENCY DEPARTMENT Provider Note   CSN: [id number] Arrival date & time: 01/29/20  4098     History Chief Complaint  Patient presents with  . Chest Pain    [name] is a 49 y.o. female with a past medical history significant for anemia, depression, ESRD on dialysis Monday/Wednesday/Friday, hypertension, and history of migraines who presents to the ED due to central, nonradiating, constant chest pain that started yesterday associated with shortness of breath worse with exertion.  Patient notes chest pain feels like chest tightness and chest pressure.  Chest pain is worse with deep inspiration, different positions, and movement.  Admits to a dry cough.  Denies fever and chills.  Has a history of asthma.  Has tried albuterol without relief.  Notes this feels different than her typical asthma exacerbations.  She also admits to a bilateral frontal throbbing headache x1 day.  Headache associated with photophobia and nausea.  Notes this feels similar to her normal migraines.  Denies sudden onset and worse intensity at onset.  Denies visual changes, changes to speech, and unilateral weakness.  Last dialysis was Friday.  Denies sick contacts and Covid exposures.  She smokes roughly a pack a day.  History obtained from patient and past medical records. No interpreter used during encounter.      Past Medical History:  Diagnosis Date  . Abscess of tendon sheath, left ankle and foot 02/14/2014  . ADD (attention deficit disorder)   . Anemia   . Anemia   . Anxiety   . Arthritis    knee, wrist, back   . Asthma   . Blood transfusion 06/2011   6 units transfused at [facility]  . Bronchitis   . Depression   . Diverticulitis   . Dizziness   . ESRD (end stage renal disease) ([place])    Hemodialysis 3 times a  week - [place]  . Gastritis   . GERD (gastroesophageal reflux disease)   . Gout   . Headache(784.0)    migraines  . Hypertension    sees Dr.  Wendie Agreste  . Neuromuscular disorder (HCC)    carpal tunnel  . Obesity   . Pneumonia   . PONV (postoperative nausea and vomiting)   . Reflux   . Renal insufficiency   . Shortness of breath   . Sleep apnea    does not use CPAP    Patient Active Problem List   Diagnosis Date Noted  . HCAP (healthcare-associated pneumonia) 03/09/2018  . Left upper extremity swelling 03/09/2018  . Cellulitis 06/23/2017  . Chronic pain syndrome   . Cellulitis of left lower extremity   . Tenosynovitis of ankle   . Heart murmur   . Sepsis ([place]) 02/15/2014  . Abscess of tendon sheath, left ankle and foot 02/14/2014  . Thinning of skin 04/09/2013  . Mechanical complication of other vascular device, implant, and graft 04/09/2013  . Pain, limb, left 10/18/2012  . Swollen 10/18/2012  . Status post endometrial ablation 12/10/2011  . ESRD on dialysis ([place]) 11/24/2011  . Renal failure (ARF), acute on chronic (HCC) 10/19/2011  . HTN (hypertension) 10/19/2011  . Anemia 10/19/2011    Past Surgical History:  Procedure Laterality Date  . APPLICATION OF WOUND VAC Left 06/24/2017   Procedure: APPLICATION OF WOUND VAC;  Surgeon: Renette Butters, MD;  Location: [place];  Service: Orthopedics;  Laterality: Left;  . AV FISTULA PLACEMENT  06/10/11   Left brachiocephalic AVF  . AV Fistula surgery  11/30/11   [place] - redo fistula  . Carpel tunnel release     x 2; bilateral  . CESAREAN SECTION    . colonscopy    . DILATION AND CURETTAGE OF UTERUS    . HYSTEROSCOPY WITH THERMACHOICE  12/10/2011   Procedure: HYSTEROSCOPY WITH THERMACHOICE;  Surgeon: Betsy Coder, MD;  Location: [facility];  Service: Gynecology;  Laterality: N/A;  . I & D EXTREMITY Left 06/12/2017   Procedure: IRRIGATION AND DEBRIDEMENT FOOT;  Surgeon: Marchia Bond, MD;  Location: [place];  Service: Orthopedics;  Laterality: Left;  . I & D EXTREMITY Left 06/14/2017   Procedure: IRRIGATION AND DEBRIDEMENT EXTREMITY/WOUND CLOSURE;  Surgeon: Renette Butters, MD;  Location: [place];  Service: Orthopedics;  Laterality: Left;  . I & D EXTREMITY Left 06/27/2017   Procedure: IRRIGATION AND DEBRIDEMENT LEFT TIBIA;  Surgeon: Renette Butters, MD;  Location: [place];  Service: Orthopedics;  Laterality: Left;  . INCISION AND DRAINAGE ABSCESS Left 06/24/2017   Procedure: INCISION AND DRAINAGE ABSCESS LEFT TIBIA AND APPLICATION OF WOUND VAC;  Surgeon: Renette Butters, MD;  Location: [place];  Service: Orthopedics;  Laterality: Left;  . IRRIGATION AND DEBRIDEMENT ABSCESS Left 02/15/2014   Procedure: IRRIGATION AND DRAINAGE OF LOW BACK ABSCESS;  Surgeon: Imogene Burn. Georgette Dover, MD;  Location: [place];  Service: General;  Laterality: Left;  . microdiskectomy  04/2001   Herniated disk L5-S1  . RENAL BIOPSY  04/2004  . REVISON OF ARTERIOVENOUS FISTULA Left 01/04/2017   Procedure: REVISON OF ARTERIOVENOUS FISTULA; CEPHALIC VEIN TURNDOWN;  Surgeon: Angelia Mould, MD;  Location: [place];  Service: Vascular;  Laterality: Left;  . REVISON OF ARTERIOVENOUS FISTULA Left 03/02/2018   Procedure: REVISION OF BRACHIOCEPHALIC ARTERIOVENOUS FISTULA LEFT UPPER ARM USING ARTEGRAFT;  Surgeon: Waynetta Sandy, MD;  Location: [place];  Service: Vascular;  Laterality: Left;  . SHUNTOGRAM  Dec. 18, 2013   Left arm  . SHUNTOGRAM N/A 10/18/2012   Procedure: Earney Mallet;  Surgeon: Conrad Big River, MD;  Location: [facility] CATH LAB;  Service: Cardiovascular;  Laterality: N/A;  . SHUNTOGRAM Left 04/18/2013   Procedure: Earney Mallet;  Surgeon: Serafina Mitchell, MD;  Location: [facility] CATH LAB;  Service: Cardiovascular;  Laterality: Left;  . SPINE SURGERY  2004   Lumbar diskectomy   . UPPER GASTROINTESTINAL ENDOSCOPY    . WISDOM TOOTH EXTRACTION       OB History   No obstetric history on file.     Family History  Problem Relation Age of Onset  . Hypertension Mother   . Heart disease Father        Heart Disease before age 38  . Hypertension Father   . Heart attack Father   . Hypertension  Daughter     Social History   Tobacco Use  . Smoking status: Former Smoker    Packs/day: 0.50    Years: 18.00    Pack years: 9.00    Types: Cigarettes    Quit date: 10/01/2017    Years since quitting: 2.3  . Smokeless tobacco: Never Used  Substance Use Topics  . Alcohol use: Not Currently    Comment: occasional  . Drug use: No    Home Medications Prior to Admission medications   Medication Sig Start Date End Date Taking? Authorizing Provider  acetaminophen (TYLENOL) 325 MG tablet Take 2 tablets (650 mg total) by mouth every 8 (eight) hours as needed for mild pain, fever or headache. 06/14/17   Burr Medico,  Lauren, MD  albuterol (PROVENTIL HFA;VENTOLIN HFA) 108 (90 Base) MCG/ACT inhaler Inhale 2 puffs into the lungs every 6 (six) hours as needed for shortness of breath. 03/11/18   Danford, Suann Larry, MD  ALPRAZolam Duanne Moron) 1 MG tablet Take 1 mg by mouth 3 (three) times daily.    [provider]  amLODipine (NORVASC) 10 MG tablet Take 10 mg by mouth at bedtime.  02/08/14   [provider]  budesonide-formoterol (SYMBICORT) 160-4.5 MCG/ACT inhaler Inhale 2 puffs into the lungs 2 (two) times daily.    [provider]  buPROPion (WELLBUTRIN SR) 150 MG 12 hr tablet Take 150 mg by mouth daily. 04/15/17   [provider]  calcium acetate (PHOSLO) 667 MG capsule Take 1,334-2,001 mg by mouth daily as needed (when eating). 3 capsules with meals and 2 capsules with snacks    [provider]  cyclobenzaprine (FLEXERIL) 10 MG tablet Take 20 mg by mouth at bedtime.     [provider]  doxycycline (VIBRAMYCIN) 100 MG capsule Take 1 capsule (100 mg total) by mouth 2 (two) times daily. 01/12/19   Recardo Evangelist, PA-C  furosemide (LASIX) 80 MG tablet Take 160 mg by mouth daily. 12/04/18   [provider]  hydrALAZINE (APRESOLINE) 50 MG tablet Take 50 mg by mouth 3 (three) times daily. 12/04/18   [provider]  HYDROmorphone (DILAUDID) 2  MG tablet Take 0.5 tablets (1 mg total) by mouth every 6 (six) hours as needed for moderate pain or severe pain. 06/28/17   Sherene Sires, DO  lidocaine-prilocaine (EMLA) cream Apply 1 application topically See admin instructions. Apply topically one hour prior to dialysis - Monday, Wednesday, Friday    [provider]  lisinopril (PRINIVIL,ZESTRIL) 40 MG tablet Take 40 mg by mouth daily. 12/04/18   [provider]  Melatonin 5 MG TABS Take 5 mg by mouth at bedtime.     [provider]  methocarbamol (ROBAXIN) 500 MG tablet Take 500 mg by mouth 2 (two) times daily. 12/29/18   [provider]  metoprolol tartrate (LOPRESSOR) 25 MG tablet Take 1 tablet (25 mg total) by mouth 2 (two) times daily. 06/14/17   Everrett Coombe, MD  montelukast (SINGULAIR) 10 MG tablet Take 10 mg by mouth daily.     [provider]  multivitamin (RENA-VIT) TABS tablet Take 1 tablet by mouth daily.    [provider]  naloxone [facility]) nasal spray 4 mg/0.1 mL Place 1 spray into the nose daily as needed (overdose).    [provider]  nicotine (NICODERM CQ - DOSED IN MG/24 HOURS) 14 mg/24hr patch Place 1 patch (14 mg total) onto the skin daily. 06/28/17   Sherene Sires, DO  oxyCODONE ER (XTAMPZA ER) 36 MG C12A Take 36 mg by mouth 2 (two) times daily.    [provider]  oxyCODONE-acetaminophen (PERCOCET/ROXICET) 5-325 MG tablet Take 1 tablet by mouth every 4 (four) hours as needed for severe pain. 01/12/19   Recardo Evangelist, PA-C  sevelamer carbonate (RENVELA) 800 MG tablet Take 1,600-2,400 mg by mouth daily as needed (when eating). Take 3 tablets with meals and 2 tablets with snacks    [provider]  SUMAtriptan (IMITREX) 50 MG tablet Take 50 mg by mouth daily as needed for migraine or headache.  02/14/14   [provider]  topiramate (TOPAMAX) 100 MG tablet Take 100 mg by mouth every morning. 12/01/18   [provider]    Allergies  Nsaids, Tolmetin, Clindamycin/lincomycin, Demerol, Fentanyl, Other, Vancomycin, Clarithromycin, and Morphine and related  Review of Systems   Review of Systems  Constitutional: Negative for chills and fever.  Eyes: Positive for photophobia. Negative for visual disturbance.  Respiratory: Positive for cough and shortness of breath.   Cardiovascular: Positive for chest pain. Negative for leg swelling.  Gastrointestinal: Positive for nausea. Negative for abdominal pain, diarrhea and vomiting.  Neurological: Positive for headaches. Negative for speech difficulty and numbness.  All other systems reviewed and are negative.   Physical Exam Updated Vital Signs BP (!) 194/105   Pulse 87   Temp 97.7 F (36.5 C) (Oral)   Resp (!) 25   Ht 5\' 4"  (1.626 m)   Wt 61.7 kg   LMP 06/27/2014   SpO2 97%   BMI 23.34 kg/m   Physical Exam Vitals and nursing note reviewed.  Constitutional:      General: She is not in acute distress. HENT:     Head: Normocephalic.  Eyes:     Pupils: Pupils are equal, round, and reactive to light.  Cardiovascular:     Rate and Rhythm: Normal rate and regular rhythm.     Pulses: Normal pulses.     Heart sounds: Normal heart sounds. No murmur. No friction rub. No gallop.   Pulmonary:     Effort: Pulmonary effort is normal.     Breath sounds: Wheezing present.     Comments: Speaking in full sentences. Wheeze heard throughout. No accessory muscle usage.  Chest:     Comments: Reproducible chest wall tenderness. Abdominal:     General: Abdomen is flat. Bowel sounds are normal. There is no distension.     Palpations: Abdomen is soft.     Tenderness: There is no abdominal tenderness. There is no guarding or rebound.  Musculoskeletal:     Cervical back: Neck supple.     Comments: Able to move all 4 extremities without difficulty. No lower extremity edema.   Skin:    General: Skin is warm and dry.  Neurological:     General: No focal deficit present.      Comments: Speech is clear, able to follow commands CN III-XII intact Normal strength in upper and lower extremities bilaterally including dorsiflexion and plantar flexion, strong and equal grip strength Sensation grossly intact throughout Moves extremities without ataxia, coordination intact No pronator drift   Psychiatric:        Mood and Affect: Mood normal.        Behavior: Behavior normal.     ED Results / Procedures / Treatments   Labs (all labs ordered are listed, but only abnormal results are displayed) Labs Reviewed  BASIC METABOLIC PANEL - Abnormal; Notable for the following components:      Result Value   Chloride 96 (*)    CO2 21 (*)    Glucose, Bld 127 (*)    BUN 86 (*)    Creatinine, Ser 10.26 (*)    Calcium 8.7 (*)    GFR calc non Af Amer 4 (*)    GFR calc Af Amer 5 (*)    Anion gap 22 (*)    All other components within normal limits  CBC - Abnormal; Notable for the following components:   RBC 2.22 (*)    Hemoglobin 7.3 (*)    HCT 23.8 (*)    MCV 107.2 (*)    All other components within normal limits  BRAIN NATRIURETIC PEPTIDE - Abnormal; Notable for the following components:  B Natriuretic Peptide 780.2 (*)    All other components within normal limits  RESPIRATORY PANEL BY RT PCR (FLU A&B, COVID)  TROPONIN I (HIGH SENSITIVITY)  TROPONIN I (HIGH SENSITIVITY)    EKG EKG Interpretation  Date/Time:  Tuesday January 29 2020 09:41:02 EDT Ventricular Rate:  82 PR Interval:  208 QRS Duration: 88 QT Interval:  404 QTC Calculation: 472 R Axis:   110 Text Interpretation: Normal sinus rhythm Right axis deviation Cannot rule out Anterior infarct , age undetermined Abnormal ECG new flipped t wave in aVF flipped t wave in III seen on prior Otherwise no significant change Confirmed by Deno Etienne [phone]) on 01/29/2020 11:06:22 AM   Radiology DG Chest 2 View  Result Date: 01/29/2020 CLINICAL DATA:  Chest pain, shortness of breath EXAM: CHEST - 2 VIEW COMPARISON:   01/11/2019 FINDINGS: Stable mild cardiomegaly. Pulmonary vascular congestion with mild interstitial edema. No pleural effusion or pneumothorax. IMPRESSION: Findings suggestive of CHF with mild interstitial edema. Electronically Signed   By: Davina Poke D.O.   On: 01/29/2020 10:38    Procedures Procedures (including critical care time)  Medications Ordered in ED Medications  AeroChamber Plus Flo-Vu Large MISC 1 each (has no administration in time range)  Chlorhexidine Gluconate Cloth 2 % PADS 6 each (has no administration in time range)  albuterol (VENTOLIN HFA) 108 (90 Base) MCG/ACT inhaler 8 puff (8 puffs Inhalation Given 01/29/20 1222)  prochlorperazine (COMPAZINE) injection 10 mg (10 mg Intravenous Given 01/29/20 1223)    ED Course  I have reviewed the triage vital signs and the nursing notes.  Pertinent labs & imaging results that were available during my care of the patient were reviewed by me and considered in my medical decision making (see chart for details).  Clinical Course as of Jan 28 1517  Tue Jan 29, 2020  1242 B Natriuretic Peptide(!): 780.2 [CA]  1322 Hemoglobin(!): 7.3 [CA]  1410 Spoke to Dr. Royce Macadamia with nephrology who agrees to evaluated patient for further treatment.    [CA]    Clinical Course User Index [CA] Suzy Bouchard, PA-C   MDM Rules/Calculators/A&P                     49 year old female presents to the ED due to shortness of breath, chest pain, and migraine.  Patient has a history of migraines and notes this feels similar to her previous episode.  Patient has end-stage renal disease and missed dialysis yesterday.  Last full dialysis was Friday.  Patient is afebrile, not tachycardic or hypoxic upon arrival.  BP elevated 188/99 however it appears patient normally has elevated BP while in the ED. patient in no acute distress and nontoxic-appearing.  Expiratory wheeze heard throughout.  Reproducible anterior chest wall tenderness.  Suspect chest pain  related to MSK etiology given reproducible nature.  Low suspicion for ACS. Normal neurological exam. Doubt hypertensive emergency/urgency.   CBC significant for hemoglobin at 7.3 which appears lower than baseline.  BNP elevated at 780.  BMP significant for worsening renal function fattening at 10.26 and BUN at 86 with an anion gap of 22 likely due to uremia from missed dialysis.  X-ray personally reviewed which demonstrates mild interstitial edema consistent with congestive heart failure.  EKG personally reviewed which demonstrates normal sinus rhythm with no signs of acute ischemia.  Delta troponin flat.  Low suspicion for ACS given normal troponins and EKG.  During patient's ED stay her O2 saturation dropped down to 86% on room  air.  Patient was placed on 2 L nasal cannula which increased her O2 saturation to 93%.  Suspect patient's shortness of breath is related to fluid overload from missed dialysis.  Will consult nephrology for further treatment options.  Spoke to Dr. Royce Macadamia with nephrology who agrees to evaluate patient for emergent dialysis given her new O2 requirement.  Patient will receive dialysis here in the hospital and most likely will be discharge pending improvement in shortness of breath. Upon reassessment, patient notes her headache has completely resolved.   Patient handed off to [facility], PA-C at shift change pending emergent dialysis. Patient will go to dialysis and will return to the ER for possible discharge. Patient may be discharge home is her shortness of breath improved after dialysis. If not, patient will most likely need medical admission.  Final Clinical Impression(s) / ED Diagnoses Final diagnoses:  Nonspecific chest pain  Shortness of breath    Rx / DC Orders ED Discharge Orders    None       Suzy Bouchard, PA-C 01/29/20 [place], DO 01/29/20 1547

## 2020-01-29 NOTE — Progress Notes (Signed)
Notified that patient presented to ED this am with SOB. Per triage notes spO2 93% on 2L [place].  CXR with mild interstitial edema. K+ 4.4,   Last dialysis 01/25/20. Due for her usual dialysis today.   Dialysis orders:  [place] MWF 3.5h 400/A1.5x EDW 60.5kg UFP 2 AVF No heparin   - Will write orders for dialysis today and should be stable for discharge from the ED after dialysis. Dialysis CN aware.   -Full consult if requires inpatient admission.   Lynnda Child PA-C [facility] Pager [phone] 01/29/2020,2:38 PM

## 2020-01-29 NOTE — Discharge Instructions (Signed)
It is extremely important that you go to dialysis tomorrow.  If you are unable to go or cannot for any reason, or if your symptoms worsen, return to the emergency room immediately. Return with any new, worsening, concerning symptoms.

## 2020-01-29 NOTE — ED Notes (Signed)
Room air sat 98%

## 2020-04-03 ENCOUNTER — Institutional Professional Consult (permissible substitution): Payer: Medicare Other | Admitting: Critical Care Medicine

## 2020-05-02 DIAGNOSIS — R519 Headache, unspecified: Secondary | ICD-10-CM | POA: Diagnosis not present

## 2020-05-02 DIAGNOSIS — E8779 Other fluid overload: Secondary | ICD-10-CM | POA: Diagnosis not present

## 2020-05-02 DIAGNOSIS — N186 End stage renal disease: Secondary | ICD-10-CM | POA: Diagnosis not present

## 2020-05-02 DIAGNOSIS — D631 Anemia in chronic kidney disease: Secondary | ICD-10-CM | POA: Diagnosis not present

## 2020-05-02 DIAGNOSIS — R52 Pain, unspecified: Secondary | ICD-10-CM | POA: Diagnosis not present

## 2020-05-02 DIAGNOSIS — E119 Type 2 diabetes mellitus without complications: Secondary | ICD-10-CM | POA: Diagnosis not present

## 2020-05-02 DIAGNOSIS — N2581 Secondary hyperparathyroidism of renal origin: Secondary | ICD-10-CM | POA: Diagnosis not present

## 2020-05-02 DIAGNOSIS — Z992 Dependence on renal dialysis: Secondary | ICD-10-CM | POA: Diagnosis not present

## 2020-05-05 DIAGNOSIS — R52 Pain, unspecified: Secondary | ICD-10-CM | POA: Diagnosis not present

## 2020-05-05 DIAGNOSIS — E8779 Other fluid overload: Secondary | ICD-10-CM | POA: Diagnosis not present

## 2020-05-05 DIAGNOSIS — E119 Type 2 diabetes mellitus without complications: Secondary | ICD-10-CM | POA: Diagnosis not present

## 2020-05-05 DIAGNOSIS — N186 End stage renal disease: Secondary | ICD-10-CM | POA: Diagnosis not present

## 2020-05-05 DIAGNOSIS — R519 Headache, unspecified: Secondary | ICD-10-CM | POA: Diagnosis not present

## 2020-05-05 DIAGNOSIS — D631 Anemia in chronic kidney disease: Secondary | ICD-10-CM | POA: Diagnosis not present

## 2020-05-05 DIAGNOSIS — N2581 Secondary hyperparathyroidism of renal origin: Secondary | ICD-10-CM | POA: Diagnosis not present

## 2020-05-05 DIAGNOSIS — Z992 Dependence on renal dialysis: Secondary | ICD-10-CM | POA: Diagnosis not present

## 2020-05-07 DIAGNOSIS — E8779 Other fluid overload: Secondary | ICD-10-CM | POA: Diagnosis not present

## 2020-05-07 DIAGNOSIS — N186 End stage renal disease: Secondary | ICD-10-CM | POA: Diagnosis not present

## 2020-05-07 DIAGNOSIS — N2581 Secondary hyperparathyroidism of renal origin: Secondary | ICD-10-CM | POA: Diagnosis not present

## 2020-05-07 DIAGNOSIS — E119 Type 2 diabetes mellitus without complications: Secondary | ICD-10-CM | POA: Diagnosis not present

## 2020-05-07 DIAGNOSIS — D631 Anemia in chronic kidney disease: Secondary | ICD-10-CM | POA: Diagnosis not present

## 2020-05-07 DIAGNOSIS — Z992 Dependence on renal dialysis: Secondary | ICD-10-CM | POA: Diagnosis not present

## 2020-05-07 DIAGNOSIS — M533 Sacrococcygeal disorders, not elsewhere classified: Secondary | ICD-10-CM | POA: Diagnosis not present

## 2020-05-07 DIAGNOSIS — R519 Headache, unspecified: Secondary | ICD-10-CM | POA: Diagnosis not present

## 2020-05-07 DIAGNOSIS — R52 Pain, unspecified: Secondary | ICD-10-CM | POA: Diagnosis not present

## 2020-05-09 DIAGNOSIS — E8779 Other fluid overload: Secondary | ICD-10-CM | POA: Diagnosis not present

## 2020-05-09 DIAGNOSIS — D631 Anemia in chronic kidney disease: Secondary | ICD-10-CM | POA: Diagnosis not present

## 2020-05-09 DIAGNOSIS — N2581 Secondary hyperparathyroidism of renal origin: Secondary | ICD-10-CM | POA: Diagnosis not present

## 2020-05-09 DIAGNOSIS — E119 Type 2 diabetes mellitus without complications: Secondary | ICD-10-CM | POA: Diagnosis not present

## 2020-05-09 DIAGNOSIS — R52 Pain, unspecified: Secondary | ICD-10-CM | POA: Diagnosis not present

## 2020-05-09 DIAGNOSIS — Z992 Dependence on renal dialysis: Secondary | ICD-10-CM | POA: Diagnosis not present

## 2020-05-09 DIAGNOSIS — N186 End stage renal disease: Secondary | ICD-10-CM | POA: Diagnosis not present

## 2020-05-09 DIAGNOSIS — R519 Headache, unspecified: Secondary | ICD-10-CM | POA: Diagnosis not present

## 2020-05-12 DIAGNOSIS — Z992 Dependence on renal dialysis: Secondary | ICD-10-CM | POA: Diagnosis not present

## 2020-05-12 DIAGNOSIS — R519 Headache, unspecified: Secondary | ICD-10-CM | POA: Diagnosis not present

## 2020-05-12 DIAGNOSIS — D631 Anemia in chronic kidney disease: Secondary | ICD-10-CM | POA: Diagnosis not present

## 2020-05-12 DIAGNOSIS — N2581 Secondary hyperparathyroidism of renal origin: Secondary | ICD-10-CM | POA: Diagnosis not present

## 2020-05-12 DIAGNOSIS — R52 Pain, unspecified: Secondary | ICD-10-CM | POA: Diagnosis not present

## 2020-05-12 DIAGNOSIS — E8779 Other fluid overload: Secondary | ICD-10-CM | POA: Diagnosis not present

## 2020-05-12 DIAGNOSIS — E119 Type 2 diabetes mellitus without complications: Secondary | ICD-10-CM | POA: Diagnosis not present

## 2020-05-12 DIAGNOSIS — N186 End stage renal disease: Secondary | ICD-10-CM | POA: Diagnosis not present

## 2020-05-13 DIAGNOSIS — E8770 Fluid overload, unspecified: Secondary | ICD-10-CM | POA: Diagnosis not present

## 2020-05-13 DIAGNOSIS — N2581 Secondary hyperparathyroidism of renal origin: Secondary | ICD-10-CM | POA: Diagnosis not present

## 2020-05-13 DIAGNOSIS — D631 Anemia in chronic kidney disease: Secondary | ICD-10-CM | POA: Diagnosis not present

## 2020-05-13 DIAGNOSIS — E8779 Other fluid overload: Secondary | ICD-10-CM | POA: Diagnosis not present

## 2020-05-13 DIAGNOSIS — R52 Pain, unspecified: Secondary | ICD-10-CM | POA: Diagnosis not present

## 2020-05-13 DIAGNOSIS — R519 Headache, unspecified: Secondary | ICD-10-CM | POA: Diagnosis not present

## 2020-05-13 DIAGNOSIS — N186 End stage renal disease: Secondary | ICD-10-CM | POA: Diagnosis not present

## 2020-05-13 DIAGNOSIS — Z992 Dependence on renal dialysis: Secondary | ICD-10-CM | POA: Diagnosis not present

## 2020-05-13 DIAGNOSIS — E119 Type 2 diabetes mellitus without complications: Secondary | ICD-10-CM | POA: Diagnosis not present

## 2020-05-14 DIAGNOSIS — N2581 Secondary hyperparathyroidism of renal origin: Secondary | ICD-10-CM | POA: Diagnosis not present

## 2020-05-14 DIAGNOSIS — R52 Pain, unspecified: Secondary | ICD-10-CM | POA: Diagnosis not present

## 2020-05-14 DIAGNOSIS — E8779 Other fluid overload: Secondary | ICD-10-CM | POA: Diagnosis not present

## 2020-05-14 DIAGNOSIS — R519 Headache, unspecified: Secondary | ICD-10-CM | POA: Diagnosis not present

## 2020-05-14 DIAGNOSIS — D631 Anemia in chronic kidney disease: Secondary | ICD-10-CM | POA: Diagnosis not present

## 2020-05-14 DIAGNOSIS — Z992 Dependence on renal dialysis: Secondary | ICD-10-CM | POA: Diagnosis not present

## 2020-05-14 DIAGNOSIS — N186 End stage renal disease: Secondary | ICD-10-CM | POA: Diagnosis not present

## 2020-05-14 DIAGNOSIS — E119 Type 2 diabetes mellitus without complications: Secondary | ICD-10-CM | POA: Diagnosis not present

## 2020-05-16 DIAGNOSIS — D631 Anemia in chronic kidney disease: Secondary | ICD-10-CM | POA: Diagnosis not present

## 2020-05-16 DIAGNOSIS — R52 Pain, unspecified: Secondary | ICD-10-CM | POA: Diagnosis not present

## 2020-05-16 DIAGNOSIS — N2581 Secondary hyperparathyroidism of renal origin: Secondary | ICD-10-CM | POA: Diagnosis not present

## 2020-05-16 DIAGNOSIS — Z992 Dependence on renal dialysis: Secondary | ICD-10-CM | POA: Diagnosis not present

## 2020-05-16 DIAGNOSIS — N186 End stage renal disease: Secondary | ICD-10-CM | POA: Diagnosis not present

## 2020-05-16 DIAGNOSIS — E8779 Other fluid overload: Secondary | ICD-10-CM | POA: Diagnosis not present

## 2020-05-16 DIAGNOSIS — E119 Type 2 diabetes mellitus without complications: Secondary | ICD-10-CM | POA: Diagnosis not present

## 2020-05-16 DIAGNOSIS — R519 Headache, unspecified: Secondary | ICD-10-CM | POA: Diagnosis not present

## 2020-05-19 DIAGNOSIS — R519 Headache, unspecified: Secondary | ICD-10-CM | POA: Diagnosis not present

## 2020-05-19 DIAGNOSIS — E8779 Other fluid overload: Secondary | ICD-10-CM | POA: Diagnosis not present

## 2020-05-19 DIAGNOSIS — R52 Pain, unspecified: Secondary | ICD-10-CM | POA: Diagnosis not present

## 2020-05-19 DIAGNOSIS — N186 End stage renal disease: Secondary | ICD-10-CM | POA: Diagnosis not present

## 2020-05-19 DIAGNOSIS — Z992 Dependence on renal dialysis: Secondary | ICD-10-CM | POA: Diagnosis not present

## 2020-05-19 DIAGNOSIS — E119 Type 2 diabetes mellitus without complications: Secondary | ICD-10-CM | POA: Diagnosis not present

## 2020-05-19 DIAGNOSIS — N2581 Secondary hyperparathyroidism of renal origin: Secondary | ICD-10-CM | POA: Diagnosis not present

## 2020-05-19 DIAGNOSIS — D631 Anemia in chronic kidney disease: Secondary | ICD-10-CM | POA: Diagnosis not present

## 2020-05-20 DIAGNOSIS — G4701 Insomnia due to medical condition: Secondary | ICD-10-CM | POA: Diagnosis not present

## 2020-05-20 DIAGNOSIS — M961 Postlaminectomy syndrome, not elsewhere classified: Secondary | ICD-10-CM | POA: Diagnosis not present

## 2020-05-20 DIAGNOSIS — G5700 Lesion of sciatic nerve, unspecified lower limb: Secondary | ICD-10-CM | POA: Diagnosis not present

## 2020-05-20 DIAGNOSIS — G8929 Other chronic pain: Secondary | ICD-10-CM | POA: Diagnosis not present

## 2020-05-20 DIAGNOSIS — N186 End stage renal disease: Secondary | ICD-10-CM | POA: Diagnosis not present

## 2020-05-21 DIAGNOSIS — E8779 Other fluid overload: Secondary | ICD-10-CM | POA: Diagnosis not present

## 2020-05-21 DIAGNOSIS — N2581 Secondary hyperparathyroidism of renal origin: Secondary | ICD-10-CM | POA: Diagnosis not present

## 2020-05-21 DIAGNOSIS — R52 Pain, unspecified: Secondary | ICD-10-CM | POA: Diagnosis not present

## 2020-05-21 DIAGNOSIS — R519 Headache, unspecified: Secondary | ICD-10-CM | POA: Diagnosis not present

## 2020-05-21 DIAGNOSIS — Z992 Dependence on renal dialysis: Secondary | ICD-10-CM | POA: Diagnosis not present

## 2020-05-21 DIAGNOSIS — N186 End stage renal disease: Secondary | ICD-10-CM | POA: Diagnosis not present

## 2020-05-21 DIAGNOSIS — E119 Type 2 diabetes mellitus without complications: Secondary | ICD-10-CM | POA: Diagnosis not present

## 2020-05-21 DIAGNOSIS — D631 Anemia in chronic kidney disease: Secondary | ICD-10-CM | POA: Diagnosis not present

## 2020-05-22 DIAGNOSIS — N2581 Secondary hyperparathyroidism of renal origin: Secondary | ICD-10-CM | POA: Diagnosis not present

## 2020-05-22 DIAGNOSIS — Z992 Dependence on renal dialysis: Secondary | ICD-10-CM | POA: Diagnosis not present

## 2020-05-22 DIAGNOSIS — N186 End stage renal disease: Secondary | ICD-10-CM | POA: Diagnosis not present

## 2020-05-22 DIAGNOSIS — E8770 Fluid overload, unspecified: Secondary | ICD-10-CM | POA: Diagnosis not present

## 2020-05-22 DIAGNOSIS — E8779 Other fluid overload: Secondary | ICD-10-CM | POA: Diagnosis not present

## 2020-05-23 DIAGNOSIS — Z992 Dependence on renal dialysis: Secondary | ICD-10-CM | POA: Diagnosis not present

## 2020-05-23 DIAGNOSIS — D631 Anemia in chronic kidney disease: Secondary | ICD-10-CM | POA: Diagnosis not present

## 2020-05-23 DIAGNOSIS — N186 End stage renal disease: Secondary | ICD-10-CM | POA: Diagnosis not present

## 2020-05-23 DIAGNOSIS — R519 Headache, unspecified: Secondary | ICD-10-CM | POA: Diagnosis not present

## 2020-05-23 DIAGNOSIS — E119 Type 2 diabetes mellitus without complications: Secondary | ICD-10-CM | POA: Diagnosis not present

## 2020-05-23 DIAGNOSIS — N2581 Secondary hyperparathyroidism of renal origin: Secondary | ICD-10-CM | POA: Diagnosis not present

## 2020-05-23 DIAGNOSIS — R52 Pain, unspecified: Secondary | ICD-10-CM | POA: Diagnosis not present

## 2020-05-23 DIAGNOSIS — E8779 Other fluid overload: Secondary | ICD-10-CM | POA: Diagnosis not present

## 2020-05-26 DIAGNOSIS — D631 Anemia in chronic kidney disease: Secondary | ICD-10-CM | POA: Diagnosis not present

## 2020-05-26 DIAGNOSIS — E8779 Other fluid overload: Secondary | ICD-10-CM | POA: Diagnosis not present

## 2020-05-26 DIAGNOSIS — R52 Pain, unspecified: Secondary | ICD-10-CM | POA: Diagnosis not present

## 2020-05-26 DIAGNOSIS — E119 Type 2 diabetes mellitus without complications: Secondary | ICD-10-CM | POA: Diagnosis not present

## 2020-05-26 DIAGNOSIS — R519 Headache, unspecified: Secondary | ICD-10-CM | POA: Diagnosis not present

## 2020-05-26 DIAGNOSIS — Z992 Dependence on renal dialysis: Secondary | ICD-10-CM | POA: Diagnosis not present

## 2020-05-26 DIAGNOSIS — N2581 Secondary hyperparathyroidism of renal origin: Secondary | ICD-10-CM | POA: Diagnosis not present

## 2020-05-26 DIAGNOSIS — N186 End stage renal disease: Secondary | ICD-10-CM | POA: Diagnosis not present

## 2020-05-28 DIAGNOSIS — R52 Pain, unspecified: Secondary | ICD-10-CM | POA: Diagnosis not present

## 2020-05-28 DIAGNOSIS — E8779 Other fluid overload: Secondary | ICD-10-CM | POA: Diagnosis not present

## 2020-05-28 DIAGNOSIS — R519 Headache, unspecified: Secondary | ICD-10-CM | POA: Diagnosis not present

## 2020-05-28 DIAGNOSIS — N2581 Secondary hyperparathyroidism of renal origin: Secondary | ICD-10-CM | POA: Diagnosis not present

## 2020-05-28 DIAGNOSIS — N186 End stage renal disease: Secondary | ICD-10-CM | POA: Diagnosis not present

## 2020-05-28 DIAGNOSIS — D631 Anemia in chronic kidney disease: Secondary | ICD-10-CM | POA: Diagnosis not present

## 2020-05-28 DIAGNOSIS — Z992 Dependence on renal dialysis: Secondary | ICD-10-CM | POA: Diagnosis not present

## 2020-05-28 DIAGNOSIS — E119 Type 2 diabetes mellitus without complications: Secondary | ICD-10-CM | POA: Diagnosis not present

## 2020-05-29 DIAGNOSIS — R519 Headache, unspecified: Secondary | ICD-10-CM | POA: Diagnosis not present

## 2020-05-29 DIAGNOSIS — N2581 Secondary hyperparathyroidism of renal origin: Secondary | ICD-10-CM | POA: Diagnosis not present

## 2020-05-29 DIAGNOSIS — N186 End stage renal disease: Secondary | ICD-10-CM | POA: Diagnosis not present

## 2020-05-29 DIAGNOSIS — R52 Pain, unspecified: Secondary | ICD-10-CM | POA: Diagnosis not present

## 2020-05-29 DIAGNOSIS — E8779 Other fluid overload: Secondary | ICD-10-CM | POA: Diagnosis not present

## 2020-05-29 DIAGNOSIS — E119 Type 2 diabetes mellitus without complications: Secondary | ICD-10-CM | POA: Diagnosis not present

## 2020-05-29 DIAGNOSIS — D631 Anemia in chronic kidney disease: Secondary | ICD-10-CM | POA: Diagnosis not present

## 2020-05-29 DIAGNOSIS — Z992 Dependence on renal dialysis: Secondary | ICD-10-CM | POA: Diagnosis not present

## 2020-05-30 DIAGNOSIS — R519 Headache, unspecified: Secondary | ICD-10-CM | POA: Diagnosis not present

## 2020-05-30 DIAGNOSIS — Z992 Dependence on renal dialysis: Secondary | ICD-10-CM | POA: Diagnosis not present

## 2020-05-30 DIAGNOSIS — R52 Pain, unspecified: Secondary | ICD-10-CM | POA: Diagnosis not present

## 2020-05-30 DIAGNOSIS — E8779 Other fluid overload: Secondary | ICD-10-CM | POA: Diagnosis not present

## 2020-05-30 DIAGNOSIS — E119 Type 2 diabetes mellitus without complications: Secondary | ICD-10-CM | POA: Diagnosis not present

## 2020-05-30 DIAGNOSIS — D631 Anemia in chronic kidney disease: Secondary | ICD-10-CM | POA: Diagnosis not present

## 2020-05-30 DIAGNOSIS — N2581 Secondary hyperparathyroidism of renal origin: Secondary | ICD-10-CM | POA: Diagnosis not present

## 2020-05-30 DIAGNOSIS — N186 End stage renal disease: Secondary | ICD-10-CM | POA: Diagnosis not present

## 2020-05-31 DIAGNOSIS — N186 End stage renal disease: Secondary | ICD-10-CM | POA: Diagnosis not present

## 2020-05-31 DIAGNOSIS — Z992 Dependence on renal dialysis: Secondary | ICD-10-CM | POA: Diagnosis not present

## 2020-05-31 DIAGNOSIS — N033 Chronic nephritic syndrome with diffuse mesangial proliferative glomerulonephritis: Secondary | ICD-10-CM | POA: Diagnosis not present

## 2020-10-16 ENCOUNTER — Other Ambulatory Visit: Payer: Self-pay | Admitting: Family Medicine

## 2020-10-16 DIAGNOSIS — Z1231 Encounter for screening mammogram for malignant neoplasm of breast: Secondary | ICD-10-CM

## 2021-11-02 DIAGNOSIS — D631 Anemia in chronic kidney disease: Secondary | ICD-10-CM | POA: Diagnosis not present

## 2021-11-02 DIAGNOSIS — L299 Pruritus, unspecified: Secondary | ICD-10-CM | POA: Diagnosis not present

## 2021-11-02 DIAGNOSIS — E119 Type 2 diabetes mellitus without complications: Secondary | ICD-10-CM | POA: Diagnosis not present

## 2021-11-02 DIAGNOSIS — Z992 Dependence on renal dialysis: Secondary | ICD-10-CM | POA: Diagnosis not present

## 2021-11-02 DIAGNOSIS — N186 End stage renal disease: Secondary | ICD-10-CM | POA: Diagnosis not present

## 2021-11-02 DIAGNOSIS — R52 Pain, unspecified: Secondary | ICD-10-CM | POA: Diagnosis not present

## 2021-11-02 DIAGNOSIS — N2581 Secondary hyperparathyroidism of renal origin: Secondary | ICD-10-CM | POA: Diagnosis not present

## 2021-11-02 DIAGNOSIS — R519 Headache, unspecified: Secondary | ICD-10-CM | POA: Diagnosis not present

## 2021-11-04 DIAGNOSIS — D631 Anemia in chronic kidney disease: Secondary | ICD-10-CM | POA: Diagnosis not present

## 2021-11-04 DIAGNOSIS — L299 Pruritus, unspecified: Secondary | ICD-10-CM | POA: Diagnosis not present

## 2021-11-04 DIAGNOSIS — R52 Pain, unspecified: Secondary | ICD-10-CM | POA: Diagnosis not present

## 2021-11-04 DIAGNOSIS — E119 Type 2 diabetes mellitus without complications: Secondary | ICD-10-CM | POA: Diagnosis not present

## 2021-11-04 DIAGNOSIS — Z992 Dependence on renal dialysis: Secondary | ICD-10-CM | POA: Diagnosis not present

## 2021-11-04 DIAGNOSIS — N2581 Secondary hyperparathyroidism of renal origin: Secondary | ICD-10-CM | POA: Diagnosis not present

## 2021-11-04 DIAGNOSIS — R519 Headache, unspecified: Secondary | ICD-10-CM | POA: Diagnosis not present

## 2021-11-04 DIAGNOSIS — N186 End stage renal disease: Secondary | ICD-10-CM | POA: Diagnosis not present

## 2021-11-06 DIAGNOSIS — E119 Type 2 diabetes mellitus without complications: Secondary | ICD-10-CM | POA: Diagnosis not present

## 2021-11-06 DIAGNOSIS — L299 Pruritus, unspecified: Secondary | ICD-10-CM | POA: Diagnosis not present

## 2021-11-06 DIAGNOSIS — D631 Anemia in chronic kidney disease: Secondary | ICD-10-CM | POA: Diagnosis not present

## 2021-11-06 DIAGNOSIS — N2581 Secondary hyperparathyroidism of renal origin: Secondary | ICD-10-CM | POA: Diagnosis not present

## 2021-11-06 DIAGNOSIS — R519 Headache, unspecified: Secondary | ICD-10-CM | POA: Diagnosis not present

## 2021-11-06 DIAGNOSIS — R52 Pain, unspecified: Secondary | ICD-10-CM | POA: Diagnosis not present

## 2021-11-06 DIAGNOSIS — N186 End stage renal disease: Secondary | ICD-10-CM | POA: Diagnosis not present

## 2021-11-06 DIAGNOSIS — Z992 Dependence on renal dialysis: Secondary | ICD-10-CM | POA: Diagnosis not present

## 2021-11-09 DIAGNOSIS — N186 End stage renal disease: Secondary | ICD-10-CM | POA: Diagnosis not present

## 2021-11-09 DIAGNOSIS — Z992 Dependence on renal dialysis: Secondary | ICD-10-CM | POA: Diagnosis not present

## 2021-11-09 DIAGNOSIS — N2581 Secondary hyperparathyroidism of renal origin: Secondary | ICD-10-CM | POA: Diagnosis not present

## 2021-11-09 DIAGNOSIS — E119 Type 2 diabetes mellitus without complications: Secondary | ICD-10-CM | POA: Diagnosis not present

## 2021-11-09 DIAGNOSIS — R519 Headache, unspecified: Secondary | ICD-10-CM | POA: Diagnosis not present

## 2021-11-09 DIAGNOSIS — L299 Pruritus, unspecified: Secondary | ICD-10-CM | POA: Diagnosis not present

## 2021-11-09 DIAGNOSIS — D631 Anemia in chronic kidney disease: Secondary | ICD-10-CM | POA: Diagnosis not present

## 2021-11-09 DIAGNOSIS — R52 Pain, unspecified: Secondary | ICD-10-CM | POA: Diagnosis not present

## 2021-11-11 DIAGNOSIS — L299 Pruritus, unspecified: Secondary | ICD-10-CM | POA: Diagnosis not present

## 2021-11-11 DIAGNOSIS — Z992 Dependence on renal dialysis: Secondary | ICD-10-CM | POA: Diagnosis not present

## 2021-11-11 DIAGNOSIS — N186 End stage renal disease: Secondary | ICD-10-CM | POA: Diagnosis not present

## 2021-11-11 DIAGNOSIS — N2581 Secondary hyperparathyroidism of renal origin: Secondary | ICD-10-CM | POA: Diagnosis not present

## 2021-11-11 DIAGNOSIS — D631 Anemia in chronic kidney disease: Secondary | ICD-10-CM | POA: Diagnosis not present

## 2021-11-11 DIAGNOSIS — E119 Type 2 diabetes mellitus without complications: Secondary | ICD-10-CM | POA: Diagnosis not present

## 2021-11-11 DIAGNOSIS — R52 Pain, unspecified: Secondary | ICD-10-CM | POA: Diagnosis not present

## 2021-11-11 DIAGNOSIS — R519 Headache, unspecified: Secondary | ICD-10-CM | POA: Diagnosis not present

## 2021-11-13 DIAGNOSIS — L299 Pruritus, unspecified: Secondary | ICD-10-CM | POA: Diagnosis not present

## 2021-11-13 DIAGNOSIS — N186 End stage renal disease: Secondary | ICD-10-CM | POA: Diagnosis not present

## 2021-11-13 DIAGNOSIS — E119 Type 2 diabetes mellitus without complications: Secondary | ICD-10-CM | POA: Diagnosis not present

## 2021-11-13 DIAGNOSIS — N2581 Secondary hyperparathyroidism of renal origin: Secondary | ICD-10-CM | POA: Diagnosis not present

## 2021-11-13 DIAGNOSIS — R519 Headache, unspecified: Secondary | ICD-10-CM | POA: Diagnosis not present

## 2021-11-13 DIAGNOSIS — Z992 Dependence on renal dialysis: Secondary | ICD-10-CM | POA: Diagnosis not present

## 2021-11-13 DIAGNOSIS — D631 Anemia in chronic kidney disease: Secondary | ICD-10-CM | POA: Diagnosis not present

## 2021-11-13 DIAGNOSIS — R52 Pain, unspecified: Secondary | ICD-10-CM | POA: Diagnosis not present

## 2021-11-16 ENCOUNTER — Encounter (HOSPITAL_COMMUNITY): Payer: Self-pay | Admitting: *Deleted

## 2021-11-16 DIAGNOSIS — N2581 Secondary hyperparathyroidism of renal origin: Secondary | ICD-10-CM | POA: Diagnosis not present

## 2021-11-16 DIAGNOSIS — D631 Anemia in chronic kidney disease: Secondary | ICD-10-CM | POA: Diagnosis not present

## 2021-11-16 DIAGNOSIS — Z992 Dependence on renal dialysis: Secondary | ICD-10-CM | POA: Diagnosis not present

## 2021-11-16 DIAGNOSIS — R52 Pain, unspecified: Secondary | ICD-10-CM | POA: Diagnosis not present

## 2021-11-16 DIAGNOSIS — E119 Type 2 diabetes mellitus without complications: Secondary | ICD-10-CM | POA: Diagnosis not present

## 2021-11-16 DIAGNOSIS — L299 Pruritus, unspecified: Secondary | ICD-10-CM | POA: Diagnosis not present

## 2021-11-16 DIAGNOSIS — R519 Headache, unspecified: Secondary | ICD-10-CM | POA: Diagnosis not present

## 2021-11-16 DIAGNOSIS — N186 End stage renal disease: Secondary | ICD-10-CM | POA: Diagnosis not present

## 2021-11-18 DIAGNOSIS — D631 Anemia in chronic kidney disease: Secondary | ICD-10-CM | POA: Diagnosis not present

## 2021-11-18 DIAGNOSIS — L299 Pruritus, unspecified: Secondary | ICD-10-CM | POA: Diagnosis not present

## 2021-11-18 DIAGNOSIS — Z992 Dependence on renal dialysis: Secondary | ICD-10-CM | POA: Diagnosis not present

## 2021-11-18 DIAGNOSIS — R52 Pain, unspecified: Secondary | ICD-10-CM | POA: Diagnosis not present

## 2021-11-18 DIAGNOSIS — R519 Headache, unspecified: Secondary | ICD-10-CM | POA: Diagnosis not present

## 2021-11-18 DIAGNOSIS — N2581 Secondary hyperparathyroidism of renal origin: Secondary | ICD-10-CM | POA: Diagnosis not present

## 2021-11-18 DIAGNOSIS — N186 End stage renal disease: Secondary | ICD-10-CM | POA: Diagnosis not present

## 2021-11-18 DIAGNOSIS — E119 Type 2 diabetes mellitus without complications: Secondary | ICD-10-CM | POA: Diagnosis not present

## 2021-11-20 DIAGNOSIS — L299 Pruritus, unspecified: Secondary | ICD-10-CM | POA: Diagnosis not present

## 2021-11-20 DIAGNOSIS — N186 End stage renal disease: Secondary | ICD-10-CM | POA: Diagnosis not present

## 2021-11-20 DIAGNOSIS — N2581 Secondary hyperparathyroidism of renal origin: Secondary | ICD-10-CM | POA: Diagnosis not present

## 2021-11-20 DIAGNOSIS — R52 Pain, unspecified: Secondary | ICD-10-CM | POA: Diagnosis not present

## 2021-11-20 DIAGNOSIS — E119 Type 2 diabetes mellitus without complications: Secondary | ICD-10-CM | POA: Diagnosis not present

## 2021-11-20 DIAGNOSIS — D631 Anemia in chronic kidney disease: Secondary | ICD-10-CM | POA: Diagnosis not present

## 2021-11-20 DIAGNOSIS — Z992 Dependence on renal dialysis: Secondary | ICD-10-CM | POA: Diagnosis not present

## 2021-11-20 DIAGNOSIS — R519 Headache, unspecified: Secondary | ICD-10-CM | POA: Diagnosis not present

## 2021-11-24 DIAGNOSIS — N2581 Secondary hyperparathyroidism of renal origin: Secondary | ICD-10-CM | POA: Diagnosis not present

## 2021-11-24 DIAGNOSIS — R52 Pain, unspecified: Secondary | ICD-10-CM | POA: Diagnosis not present

## 2021-11-24 DIAGNOSIS — R519 Headache, unspecified: Secondary | ICD-10-CM | POA: Diagnosis not present

## 2021-11-24 DIAGNOSIS — N186 End stage renal disease: Secondary | ICD-10-CM | POA: Diagnosis not present

## 2021-11-24 DIAGNOSIS — L299 Pruritus, unspecified: Secondary | ICD-10-CM | POA: Diagnosis not present

## 2021-11-24 DIAGNOSIS — Z992 Dependence on renal dialysis: Secondary | ICD-10-CM | POA: Diagnosis not present

## 2021-11-24 DIAGNOSIS — D631 Anemia in chronic kidney disease: Secondary | ICD-10-CM | POA: Diagnosis not present

## 2021-11-24 DIAGNOSIS — E119 Type 2 diabetes mellitus without complications: Secondary | ICD-10-CM | POA: Diagnosis not present

## 2021-11-25 DIAGNOSIS — R519 Headache, unspecified: Secondary | ICD-10-CM | POA: Diagnosis not present

## 2021-11-25 DIAGNOSIS — L299 Pruritus, unspecified: Secondary | ICD-10-CM | POA: Diagnosis not present

## 2021-11-25 DIAGNOSIS — Z992 Dependence on renal dialysis: Secondary | ICD-10-CM | POA: Diagnosis not present

## 2021-11-25 DIAGNOSIS — D631 Anemia in chronic kidney disease: Secondary | ICD-10-CM | POA: Diagnosis not present

## 2021-11-25 DIAGNOSIS — E119 Type 2 diabetes mellitus without complications: Secondary | ICD-10-CM | POA: Diagnosis not present

## 2021-11-25 DIAGNOSIS — N2581 Secondary hyperparathyroidism of renal origin: Secondary | ICD-10-CM | POA: Diagnosis not present

## 2021-11-25 DIAGNOSIS — R52 Pain, unspecified: Secondary | ICD-10-CM | POA: Diagnosis not present

## 2021-11-25 DIAGNOSIS — N186 End stage renal disease: Secondary | ICD-10-CM | POA: Diagnosis not present

## 2021-11-27 DIAGNOSIS — Z992 Dependence on renal dialysis: Secondary | ICD-10-CM | POA: Diagnosis not present

## 2021-11-27 DIAGNOSIS — D631 Anemia in chronic kidney disease: Secondary | ICD-10-CM | POA: Diagnosis not present

## 2021-11-27 DIAGNOSIS — R52 Pain, unspecified: Secondary | ICD-10-CM | POA: Diagnosis not present

## 2021-11-27 DIAGNOSIS — N2581 Secondary hyperparathyroidism of renal origin: Secondary | ICD-10-CM | POA: Diagnosis not present

## 2021-11-27 DIAGNOSIS — L299 Pruritus, unspecified: Secondary | ICD-10-CM | POA: Diagnosis not present

## 2021-11-27 DIAGNOSIS — E119 Type 2 diabetes mellitus without complications: Secondary | ICD-10-CM | POA: Diagnosis not present

## 2021-11-27 DIAGNOSIS — R519 Headache, unspecified: Secondary | ICD-10-CM | POA: Diagnosis not present

## 2021-11-27 DIAGNOSIS — N186 End stage renal disease: Secondary | ICD-10-CM | POA: Diagnosis not present

## 2021-11-30 DIAGNOSIS — R519 Headache, unspecified: Secondary | ICD-10-CM | POA: Diagnosis not present

## 2021-11-30 DIAGNOSIS — D631 Anemia in chronic kidney disease: Secondary | ICD-10-CM | POA: Diagnosis not present

## 2021-11-30 DIAGNOSIS — E119 Type 2 diabetes mellitus without complications: Secondary | ICD-10-CM | POA: Diagnosis not present

## 2021-11-30 DIAGNOSIS — R52 Pain, unspecified: Secondary | ICD-10-CM | POA: Diagnosis not present

## 2021-11-30 DIAGNOSIS — N186 End stage renal disease: Secondary | ICD-10-CM | POA: Diagnosis not present

## 2021-11-30 DIAGNOSIS — L299 Pruritus, unspecified: Secondary | ICD-10-CM | POA: Diagnosis not present

## 2021-11-30 DIAGNOSIS — Z992 Dependence on renal dialysis: Secondary | ICD-10-CM | POA: Diagnosis not present

## 2021-11-30 DIAGNOSIS — N2581 Secondary hyperparathyroidism of renal origin: Secondary | ICD-10-CM | POA: Diagnosis not present

## 2021-12-01 DIAGNOSIS — N033 Chronic nephritic syndrome with diffuse mesangial proliferative glomerulonephritis: Secondary | ICD-10-CM | POA: Diagnosis not present

## 2021-12-01 DIAGNOSIS — N186 End stage renal disease: Secondary | ICD-10-CM | POA: Diagnosis not present

## 2021-12-01 DIAGNOSIS — Z992 Dependence on renal dialysis: Secondary | ICD-10-CM | POA: Diagnosis not present

## 2021-12-02 DIAGNOSIS — N186 End stage renal disease: Secondary | ICD-10-CM | POA: Diagnosis not present

## 2021-12-02 DIAGNOSIS — L299 Pruritus, unspecified: Secondary | ICD-10-CM | POA: Diagnosis not present

## 2021-12-02 DIAGNOSIS — R52 Pain, unspecified: Secondary | ICD-10-CM | POA: Diagnosis not present

## 2021-12-02 DIAGNOSIS — D631 Anemia in chronic kidney disease: Secondary | ICD-10-CM | POA: Diagnosis not present

## 2021-12-02 DIAGNOSIS — Z992 Dependence on renal dialysis: Secondary | ICD-10-CM | POA: Diagnosis not present

## 2021-12-02 DIAGNOSIS — N2581 Secondary hyperparathyroidism of renal origin: Secondary | ICD-10-CM | POA: Diagnosis not present

## 2021-12-02 DIAGNOSIS — R519 Headache, unspecified: Secondary | ICD-10-CM | POA: Diagnosis not present

## 2021-12-03 ENCOUNTER — Encounter: Payer: Self-pay | Admitting: Nurse Practitioner

## 2021-12-03 ENCOUNTER — Other Ambulatory Visit: Payer: Self-pay

## 2021-12-03 ENCOUNTER — Ambulatory Visit (INDEPENDENT_AMBULATORY_CARE_PROVIDER_SITE_OTHER): Payer: Medicare Other | Admitting: Nurse Practitioner

## 2021-12-03 VITALS — BP 145/88 | HR 76 | Temp 97.6°F | Ht 64.0 in | Wt 173.6 lb

## 2021-12-03 DIAGNOSIS — R110 Nausea: Secondary | ICD-10-CM

## 2021-12-03 DIAGNOSIS — J4520 Mild intermittent asthma, uncomplicated: Secondary | ICD-10-CM

## 2021-12-03 DIAGNOSIS — Z992 Dependence on renal dialysis: Secondary | ICD-10-CM

## 2021-12-03 DIAGNOSIS — N186 End stage renal disease: Secondary | ICD-10-CM

## 2021-12-03 DIAGNOSIS — Z6829 Body mass index (BMI) 29.0-29.9, adult: Secondary | ICD-10-CM

## 2021-12-03 DIAGNOSIS — G894 Chronic pain syndrome: Secondary | ICD-10-CM

## 2021-12-03 DIAGNOSIS — L03116 Cellulitis of left lower limb: Secondary | ICD-10-CM

## 2021-12-03 DIAGNOSIS — F411 Generalized anxiety disorder: Secondary | ICD-10-CM

## 2021-12-03 DIAGNOSIS — Z7689 Persons encountering health services in other specified circumstances: Secondary | ICD-10-CM

## 2021-12-03 MED ORDER — BUSPIRONE HCL 10 MG PO TABS: 10.0000 mg | ORAL_TABLET | Freq: Three times a day (TID) | ORAL | 3 refills | Status: DC

## 2021-12-03 MED ORDER — MONTELUKAST SODIUM 10 MG PO TABS: 10.0000 mg | ORAL_TABLET | Freq: Every day | ORAL | 5 refills | Status: AC

## 2021-12-03 MED ORDER — PROMETHAZINE HCL 25 MG PO TABS: 25.0000 mg | ORAL_TABLET | Freq: Two times a day (BID) | ORAL | 2 refills | Status: AC | PRN

## 2021-12-03 MED ORDER — MUPIROCIN 2 % EX OINT: TOPICAL_OINTMENT | CUTANEOUS | 3 refills | Status: DC

## 2021-12-03 MED ORDER — BUDESONIDE-FORMOTEROL FUMARATE 160-4.5 MCG/ACT IN AERO: 2.0000 | INHALATION_SPRAY | Freq: Two times a day (BID) | RESPIRATORY_TRACT | 5 refills | Status: AC

## 2021-12-03 MED ORDER — ALPRAZOLAM 1 MG PO TABS: 1.0000 mg | ORAL_TABLET | Freq: Three times a day (TID) | ORAL | 1 refills | Status: DC

## 2021-12-03 NOTE — Patient Instructions (Signed)
Fat and Cholesterol Restricted Eating Plan Getting too much fat and cholesterol in your diet may cause health problems. Choosing the right foods helps keep your fat and cholesterol at normal levels. This can keep you from getting certain diseases. Your doctor may recommend an eating plan that includes: Total fat: ______% or less of total calories a day. This is ______g of fat a day. Saturated fat: ______% or less of total calories a day. This is ______g of saturated fat a day. Cholesterol: less than _________mg a day. Fiber: ______g a day. What are tips for following this plan? General tips Work with your doctor to lose weight if you need to. Avoid: Foods with added sugar. Fried foods. Foods with trans fat or partially hydrogenated oils. This includes some margarines and baked goods. If you drink alcohol: Limit how much you have to: 0-1 drink a day for women who are not pregnant. 0-2 drinks a day for men. Know how much alcohol is in a drink. In the [place] one drink equals one 12 oz bottle of beer (355 mL), one 5 oz glass of wine (148 mL), or one 1 oz glass of hard liquor (44 mL). Reading food labels Check food labels for: Trans fats. Partially hydrogenated oils. Saturated fat (g) in each serving. Cholesterol (mg) in each serving. Fiber (g) in each serving. Choose foods with healthy fats, such as: Monounsaturated fats and polyunsaturated fats. These include olive and canola oil, flaxseeds, walnuts, almonds, and seeds. Omega-3 fats. These are found in certain fish, flaxseed oil, and ground flaxseeds. Choose grain products that have whole grains. Look for the word "whole" as the first word in the ingredient list. Cooking Cook foods using low-fat methods. These include baking, boiling, grilling, and broiling. Eat more home-cooked foods. Eat at restaurants and buffets less often. Eat less fast food. Avoid cooking using saturated fats, such as butter, cream, palm oil, palm kernel oil, and  coconut oil. Meal planning  At meals, divide your plate into four equal parts: Fill one-half of your plate with vegetables, green salads, and fruit. Fill one-fourth of your plate with whole grains. Fill one-fourth of your plate with low-fat (lean) protein foods. Eat fish that is high in omega-3 fats at least two times a week. This includes mackerel, tuna, sardines, and salmon. Eat foods that are high in fiber, such as whole grains, beans, apples, pears, berries, broccoli, carrots, peas, and barley. What foods should I eat? Fruits All fresh, canned (in natural juice), or frozen fruits. Vegetables Fresh or frozen vegetables (raw, steamed, roasted, or grilled). Green salads. Grains Whole grains, such as whole wheat or whole grain breads, crackers, cereals, and pasta. Unsweetened oatmeal, bulgur, barley, quinoa, or brown rice. Corn or whole wheat flour tortillas. Meats and other protein foods Ground beef (85% or leaner), grass-fed beef, or beef trimmed of fat. Skinless chicken or turkey. Ground chicken or turkey. Pork trimmed of fat. All fish and seafood. Egg whites. Dried beans, peas, or lentils. Unsalted nuts or seeds. Unsalted canned beans. Nut butters without added sugar or oil. Dairy Low-fat or nonfat dairy products, such as skim or 1% milk, 2% or reduced-fat cheeses, low-fat and fat-free ricotta or cottage cheese, or plain low-fat and nonfat yogurt. Fats and oils Tub margarine without trans fats. Light or reduced-fat mayonnaise and salad dressings. Avocado. Olive, canola, sesame, or safflower oils. The items listed above may not be a complete list of foods and beverages you can eat. Contact a dietitian for more information. What foods   should I avoid? Fruits Canned fruit in heavy syrup. Fruit in cream or butter sauce. Fried fruit. Vegetables Vegetables cooked in cheese, cream, or butter sauce. Fried vegetables. Grains White bread. White pasta. White rice. Cornbread. Bagels, pastries,  and croissants. Crackers and snack foods that contain trans fat and hydrogenated oils. Meats and other protein foods Fatty cuts of meat. Ribs, chicken wings, bacon, sausage, bologna, salami, chitterlings, fatback, hot dogs, bratwurst, and packaged lunch meats. Liver and organ meats. Whole eggs and egg yolks. Chicken and turkey with skin. Fried meat. Dairy Whole or 2% milk, cream, half-and-half, and cream cheese. Whole milk cheeses. Whole-fat or sweetened yogurt. Full-fat cheeses. Nondairy creamers and whipped toppings. Processed cheese, cheese spreads, and cheese curds. Fats and oils Butter, stick margarine, lard, shortening, ghee, or bacon fat. Coconut, palm kernel, and palm oils. Beverages Alcohol. Sugar-sweetened drinks such as sodas, lemonade, and fruit drinks. Sweets and desserts Corn syrup, sugars, honey, and molasses. Candy. Jam and jelly. Syrup. Sweetened cereals. Cookies, pies, cakes, donuts, muffins, and ice cream. The items listed above may not be a complete list of foods and beverages you should avoid. Contact a dietitian for more information. Summary Choosing the right foods helps keep your fat and cholesterol at normal levels. This can keep you from getting certain diseases. At meals, fill one-half of your plate with vegetables, green salads, and fruits. Eat high fiber foods, like whole grains, beans, apples, pears, berries, carrots, peas, and barley. Limit added sugar, saturated fats, alcohol, and fried foods. This information is not intended to replace advice given to you by your health care provider. Make sure you discuss any questions you have with your health care provider. Document Revised: 02/27/2021 Document Reviewed: 02/27/2021 Elsevier Patient Education  2022 [employer]  

## 2021-12-03 NOTE — Progress Notes (Signed)
New Patient Office Visit  Subjective:  Patient ID: [name], female    DOB: 05/29/1971  Age: 50 y.o. MRN: [id number]  CC:  Chief Complaint  Patient presents with   New Patient (Initial Visit)    HPI [name] presents to establish new primary care provider. She has ESRD and is on hemodialysis on Monday, Wednesday, and Friday. She sees pain management provider and has moderate anxiety. She is currently on citalopram daily and takes alprazolam 1mg  three times daily. She states that she has also tried hydroxyzine. She states that this has been provided for her from her pain management provider. She has not tried other medication to treat panic attacks. She states that these only started recently, after the death of her father and sister.  She is due to have routine, fasting labs.  She is due to have mammogram.   Past Medical History:  Diagnosis Date   Abscess of tendon sheath, left ankle and foot 02/14/2014   ADD (attention deficit disorder)    Anemia    Anemia    Anxiety    Arthritis    knee, wrist, back    Asthma    Blood transfusion 06/2011   6 units transfused at [facility]   Bronchitis    Depression    Diverticulitis    Dizziness    ESRD (end stage renal disease) ([place])    Hemodialysis 3 times a  week - [place]   Gastritis    GERD (gastroesophageal reflux disease)    Gout    Headache(784.0)    migraines   Hypertension    sees Dr. Wendie Agreste   Neuromuscular disorder [facility])    carpal tunnel   Obesity    Pneumonia    PONV (postoperative nausea and vomiting)    Reflux    Renal insufficiency    Shortness of breath    Sleep apnea    does not use CPAP    Past Surgical History:  Procedure Laterality Date   APPLICATION OF WOUND VAC Left 06/24/2017   Procedure: APPLICATION OF WOUND VAC;  Surgeon: Renette Butters, MD;  Location: [place];  Service: Orthopedics;  Laterality: Left;   AV FISTULA PLACEMENT  06/10/11   Left brachiocephalic AVF   AV  Fistula surgery  11/30/11   [facility] - redo fistula   Carpel tunnel release     x 2; bilateral   CESAREAN SECTION     colonscopy     DILATION AND CURETTAGE OF UTERUS     HYSTEROSCOPY WITH THERMACHOICE  12/10/2011   Procedure: HYSTEROSCOPY WITH THERMACHOICE;  Surgeon: Betsy Coder, MD;  Location: [place] [facility];  Service: Gynecology;  Laterality: N/A;   I & D EXTREMITY Left 06/12/2017   Procedure: IRRIGATION AND DEBRIDEMENT FOOT;  Surgeon: Marchia Bond, MD;  Location: [place];  Service: Orthopedics;  Laterality: Left;   I & D EXTREMITY Left 06/14/2017   Procedure: IRRIGATION AND DEBRIDEMENT EXTREMITY/WOUND CLOSURE;  Surgeon: Renette Butters, MD;  Location: [place];  Service: Orthopedics;  Laterality: Left;   I & D EXTREMITY Left 06/27/2017   Procedure: IRRIGATION AND DEBRIDEMENT LEFT TIBIA;  Surgeon: Renette Butters, MD;  Location: [place];  Service: Orthopedics;  Laterality: Left;   INCISION AND DRAINAGE ABSCESS Left 06/24/2017   Procedure: INCISION AND DRAINAGE ABSCESS LEFT TIBIA AND APPLICATION OF WOUND VAC;  Surgeon: Renette Butters, MD;  Location: [place];  Service: Orthopedics;  Laterality: Left;   IRRIGATION AND DEBRIDEMENT  ABSCESS Left 02/15/2014   Procedure: IRRIGATION AND DRAINAGE OF LOW BACK ABSCESS;  Surgeon: Imogene Burn. Georgette Dover, MD;  Location: [place];  Service: General;  Laterality: Left;   microdiskectomy  04/2001   Herniated disk L5-S1   RENAL BIOPSY  04/2004   REVISON OF ARTERIOVENOUS FISTULA Left 01/04/2017   Procedure: REVISON OF ARTERIOVENOUS FISTULA; CEPHALIC VEIN TURNDOWN;  Surgeon: Angelia Mould, MD;  Location: [place];  Service: Vascular;  Laterality: Left;   REVISON OF ARTERIOVENOUS FISTULA Left 03/02/2018   Procedure: REVISION OF BRACHIOCEPHALIC ARTERIOVENOUS FISTULA LEFT UPPER ARM USING ARTEGRAFT;  Surgeon: Waynetta Sandy, MD;  Location: [place];  Service: Vascular;  Laterality: Left;   SHUNTOGRAM  Dec. 18, 2013   Left arm   SHUNTOGRAM N/A 10/18/2012   Procedure:  Earney Mallet;  Surgeon: Conrad Raiford, MD;  Location: [facility] C[facility] LAB;  Service: Cardiovascular;  Laterality: N/A;   SHUNTOGRAM Left 04/18/2013   Procedure: Earney Mallet;  Surgeon: Serafina Mitchell, MD;  Location: [facility] CATH LAB;  Service: Cardiovascular;  Laterality: Left;   SPINE SURGERY  2004   Lumbar diskectomy    UPPER GASTROINTESTINAL ENDOSCOPY     WISDOM TOOTH EXTRACTION      Family History  Problem Relation Age of Onset   Hypertension Mother    Heart disease Father        Heart Disease before age 29   Hypertension Father    Heart attack Father    Hypertension Daughter     Social History   Socioeconomic History   Marital status: Married    Spouse name: Not on file   Number of children: Not on file   Years of education: Not on file   Highest education level: Not on file  Occupational History   Not on file  Tobacco Use   Smoking status: Former    Packs/day: 0.50    Years: 18.00    Pack years: 9.00    Types: Cigarettes    Quit date: 10/01/2017    Years since quitting: 4.1   Smokeless tobacco: Never  Vaping Use   Vaping Use: Former  Substance and Sexual Activity   Alcohol use: Not Currently    Comment: occasional   Drug use: No   Sexual activity: Not Currently    Birth control/protection: Condom  Other Topics Concern   Not on file  Social History Narrative   Not on file   Social Determinants of Health   Financial Resource Strain: Not on file  Food Insecurity: Not on file  Transportation Needs: Not on file  Physical Activity: Not on file  Stress: Not on file  Social Connections: Not on file  Intimate Partner Violence: Not on file    ROS Review of Systems  Constitutional:  Positive for fatigue. Negative for activity change, appetite change, chills and fever.  HENT:  Negative for congestion, postnasal drip, rhinorrhea, sinus pressure, sinus pain, sneezing and sore throat.   Eyes: Negative.   Respiratory:  Negative for cough, chest tightness, shortness of breath and  wheezing.   Cardiovascular:  Negative for chest pain and palpitations.  Gastrointestinal:  Positive for nausea. Negative for abdominal pain, constipation, diarrhea and vomiting.       Nausea is more severe when she has dialysis. Alternates between zofran and promethazine to keep  nausea at [place]. Needs new prescription for promethazine.   Endocrine: Negative for cold intolerance, heat intolerance, polydipsia and polyuria.  Genitourinary:  Negative for dyspareunia, dysuria, flank pain, frequency and urgency.  Musculoskeletal:  Negative for arthralgias, back pain and myalgias.  Skin:  Negative for rash.  Allergic/Immunologic: Negative for environmental allergies.  Neurological:  Negative for dizziness, weakness and headaches.  Hematological:  Negative for adenopathy.  Psychiatric/Behavioral:  Positive for dysphoric mood and sleep disturbance. The patient is nervous/anxious.    Objective:   Today's Vitals   12/03/21 1105 12/03/21 1126  BP: (!) 155/87 (!) 145/88  Pulse: 76   Temp: 97.6 F (36.4 C)   SpO2: 97%   Weight: 173 lb 9 oz (78.7 kg)   Height: 5\' 4"  (1.626 m)    Body mass index is 29.79 kg/m.   Physical Exam Vitals and nursing note reviewed.  Constitutional:      Appearance: Normal appearance. She is well-developed.  HENT:     Head: Normocephalic and atraumatic.     Nose: Nose normal.     Mouth/Throat:     Mouth: Mucous membranes are moist.     Pharynx: Oropharynx is clear.  Eyes:     Extraocular Movements: Extraocular movements intact.     Conjunctiva/sclera: Conjunctivae normal.     Pupils: Pupils are equal, round, and reactive to light.  Cardiovascular:     Rate and Rhythm: Normal rate and regular rhythm.     Pulses: Normal pulses.     Heart sounds: Normal heart sounds.  Pulmonary:     Effort: Pulmonary effort is normal.     Breath sounds: Normal breath sounds.  Abdominal:     Palpations: Abdomen is soft.  Musculoskeletal:        General: Normal range of  motion.     Cervical back: Normal range of motion and neck supple.  Lymphadenopathy:     Cervical: No cervical adenopathy.  Skin:    General: Skin is warm and dry.     Capillary Refill: Capillary refill takes less than 2 seconds.  Neurological:     General: No focal deficit present.     Mental Status: She is alert and oriented to person, place, and time.  Psychiatric:        Attention and Perception: Attention and perception normal.        Mood and Affect: Affect normal. Mood is depressed.        Speech: Speech normal.        Behavior: Behavior normal. Behavior is cooperative.        Thought Content: Thought content normal.        Cognition and Memory: Cognition and memory normal.        Judgment: Judgment normal.    Assessment & Plan:  1. Encounter to establish care Appointment today to establish new primary care provider    2. Mild intermittent asthma without complication Continue to use symbicort twice daily. Use rescue inhaler as needed and as prescribed. Continue to take singulair daily  - budesonide-formoterol (SYMBICORT) 160-4.5 MCG/ACT inhaler; Inhale 2 puffs into the lungs 2 (two) times daily.  Dispense: 1 each; Refill: 5 - montelukast (SINGULAIR) 10 MG tablet; Take 1 tablet (10 mg total) by mouth daily.  Dispense: 30 tablet; Refill: 5  3. Chronic nausea Renew promethazine 25mg  which may be taken twice daily when needed.  - promethazine (PHENERGAN) 25 MG tablet; Take 1 tablet (25 mg total) by mouth 2 (two) times daily as needed for nausea or vomiting.  Dispense: 60 tablet; Refill: 2  4. ESRD on dialysis [facility]) Patient on Monday, Wednesday, Friday dialysis schedule.   5. Cellulitis of left lower extremity Area  of vein extraction frequently getting superficial infection. May use bactroban ointment twice daily when needed.  - mupirocin ointment (BACTROBAN) 2 %; Apply te effected areas twice daily  Dispense: 22 g; Refill: 3  6. Generalized anxiety disorder Continue  citalopram daily. Discussed weaning down dosing of alprazolam. Added buspirone 10mg  up to three times daily. Recommended she try this medicine prior to taking alprazolam. May also cut alprazolam in half if needed. Consider referral to psychiatry if no improvement over next few visits.  - ALPRAZolam (XANAX) 1 MG tablet; Take 1 tablet (1 mg total) by mouth 3 (three) times daily.  Dispense: 90 tablet; Refill: 1 - busPIRone (BUSPAR) 10 MG tablet; Take 1 tablet (10 mg total) by mouth 3 (three) times daily.  Dispense: 90 tablet; Refill: 3  7. Body mass index 29.0-29.9, adult Discussed lowering calorie intake to 1500 calories per day and incorporating exercise into daily routine to help lose weight.   8. Chronic pain syndrome Conitnue regular visits with pain management as scheduled.     Problem List Items Addressed This Visit       Respiratory   Mild intermittent asthma without complication   Relevant Medications   budesonide-formoterol (SYMBICORT) 160-4.5 MCG/ACT inhaler   montelukast (SINGULAIR) 10 MG tablet     Genitourinary   ESRD on dialysis ([place])     Other   Cellulitis of left lower extremity   Relevant Medications   mupirocin ointment (BACTROBAN) 2 %   Chronic pain syndrome   Chronic nausea   Relevant Medications   promethazine (PHENERGAN) 25 MG tablet   Generalized anxiety disorder   Relevant Medications   ALPRAZolam (XANAX) 1 MG tablet   busPIRone (BUSPAR) 10 MG tablet   Body mass index 29.0-29.9, adult   Other Visit Diagnoses     Encounter to establish care    -  Primary       Outpatient Encounter Medications as of 12/03/2021  Medication Sig   busPIRone (BUSPAR) 10 MG tablet Take 1 tablet (10 mg total) by mouth 3 (three) times daily.   mupirocin ointment (BACTROBAN) 2 % Apply te effected areas twice daily   promethazine (PHENERGAN) 25 MG tablet Take 1 tablet (25 mg total) by mouth 2 (two) times daily as needed for nausea or vomiting.   acetaminophen (TYLENOL) 325 MG  tablet Take 2 tablets (650 mg total) by mouth every 8 (eight) hours as needed for mild pain, fever or headache.   albuterol (PROVENTIL HFA;VENTOLIN HFA) 108 (90 Base) MCG/ACT inhaler Inhale 2 puffs into the lungs every 6 (six) hours as needed for shortness of breath.   ALPRAZolam (XANAX) 1 MG tablet Take 1 tablet (1 mg total) by mouth 3 (three) times daily.   amLODipine (NORVASC) 10 MG tablet Take 10 mg by mouth at bedtime.    budesonide-formoterol (SYMBICORT) 160-4.5 MCG/ACT inhaler Inhale 2 puffs into the lungs 2 (two) times daily.   calcium acetate (PHOSLO) 667 MG capsule Take 1,334-2,001 mg by mouth daily as needed (when eating). 3 capsules with meals and 2 capsules with snacks   cyclobenzaprine (FLEXERIL) 10 MG tablet Take 20 mg by mouth at bedtime.    doxycycline (VIBRAMYCIN) 100 MG capsule Take 1 capsule (100 mg total) by mouth 2 (two) times daily.   furosemide (LASIX) 80 MG tablet Take 160 mg by mouth daily.   hydrALAZINE (APRESOLINE) 50 MG tablet Take 50 mg by mouth 3 (three) times daily.   HYDROmorphone (DILAUDID) 2 MG tablet Take 0.5 tablets (1 mg total)  by mouth every 6 (six) hours as needed for moderate pain or severe pain.   lidocaine-prilocaine (EMLA) cream Apply 1 application topically See admin instructions. Apply topically one hour prior to dialysis - Monday, Wednesday, Friday   lisinopril (PRINIVIL,ZESTRIL) 40 MG tablet Take 40 mg by mouth daily.   Melatonin 5 MG TABS Take 5 mg by mouth at bedtime.    methocarbamol (ROBAXIN) 500 MG tablet Take 500 mg by mouth 2 (two) times daily.   metoprolol tartrate (LOPRESSOR) 25 MG tablet Take 1 tablet (25 mg total) by mouth 2 (two) times daily.   montelukast (SINGULAIR) 10 MG tablet Take 1 tablet (10 mg total) by mouth daily.   multivitamin (RENA-VIT) TABS tablet Take 1 tablet by mouth daily.   naloxone (NARCAN) nasal spray 4 mg/0.1 mL Place 1 spray into the nose daily as needed (overdose).   nicotine (NICODERM CQ - DOSED IN MG/24 HOURS) 14  mg/24hr patch Place 1 patch (14 mg total) onto the skin daily.   sevelamer carbonate (RENVELA) 800 MG tablet Take 1,600-2,400 mg by mouth daily as needed (when eating). Take 3 tablets with meals and 2 tablets with snacks   SUMAtriptan (IMITREX) 50 MG tablet Take 50 mg by mouth daily as needed for migraine or headache.    [DISCONTINUED] ALPRAZolam (XANAX) 1 MG tablet Take 1 mg by mouth 3 (three) times daily.   [DISCONTINUED] budesonide-formoterol (SYMBICORT) 160-4.5 MCG/ACT inhaler Inhale 2 puffs into the lungs 2 (two) times daily.   [DISCONTINUED] buPROPion (WELLBUTRIN SR) 150 MG 12 hr tablet Take 150 mg by mouth daily.   [DISCONTINUED] montelukast (SINGULAIR) 10 MG tablet Take 10 mg by mouth daily.    [DISCONTINUED] oxyCODONE ER (XTAMPZA ER) 36 MG C12A Take 36 mg by mouth 2 (two) times daily.   [DISCONTINUED] oxyCODONE-acetaminophen (PERCOCET/ROXICET) 5-325 MG tablet Take 1 tablet by mouth every 4 (four) hours as needed for severe pain.   [DISCONTINUED] topiramate (TOPAMAX) 100 MG tablet Take 100 mg by mouth every morning.   No facility-administered encounter medications on file as of 12/03/2021.    Follow-up: Return in about 4 weeks (around 12/31/2021) for health maintenance exam, with pap - please get lab results from  [facility] .   Ronnell Freshwater, NP

## 2021-12-04 DIAGNOSIS — D631 Anemia in chronic kidney disease: Secondary | ICD-10-CM | POA: Diagnosis not present

## 2021-12-04 DIAGNOSIS — L299 Pruritus, unspecified: Secondary | ICD-10-CM | POA: Diagnosis not present

## 2021-12-04 DIAGNOSIS — N2581 Secondary hyperparathyroidism of renal origin: Secondary | ICD-10-CM | POA: Diagnosis not present

## 2021-12-04 DIAGNOSIS — R52 Pain, unspecified: Secondary | ICD-10-CM | POA: Diagnosis not present

## 2021-12-04 DIAGNOSIS — Z992 Dependence on renal dialysis: Secondary | ICD-10-CM | POA: Diagnosis not present

## 2021-12-04 DIAGNOSIS — N186 End stage renal disease: Secondary | ICD-10-CM | POA: Diagnosis not present

## 2021-12-04 DIAGNOSIS — R519 Headache, unspecified: Secondary | ICD-10-CM | POA: Diagnosis not present

## 2021-12-07 DIAGNOSIS — Z992 Dependence on renal dialysis: Secondary | ICD-10-CM | POA: Diagnosis not present

## 2021-12-07 DIAGNOSIS — R52 Pain, unspecified: Secondary | ICD-10-CM | POA: Diagnosis not present

## 2021-12-07 DIAGNOSIS — D631 Anemia in chronic kidney disease: Secondary | ICD-10-CM | POA: Diagnosis not present

## 2021-12-07 DIAGNOSIS — N2581 Secondary hyperparathyroidism of renal origin: Secondary | ICD-10-CM | POA: Diagnosis not present

## 2021-12-07 DIAGNOSIS — L299 Pruritus, unspecified: Secondary | ICD-10-CM | POA: Diagnosis not present

## 2021-12-07 DIAGNOSIS — R519 Headache, unspecified: Secondary | ICD-10-CM | POA: Diagnosis not present

## 2021-12-07 DIAGNOSIS — N186 End stage renal disease: Secondary | ICD-10-CM | POA: Diagnosis not present

## 2021-12-09 DIAGNOSIS — N2581 Secondary hyperparathyroidism of renal origin: Secondary | ICD-10-CM | POA: Diagnosis not present

## 2021-12-09 DIAGNOSIS — L299 Pruritus, unspecified: Secondary | ICD-10-CM | POA: Diagnosis not present

## 2021-12-09 DIAGNOSIS — Z992 Dependence on renal dialysis: Secondary | ICD-10-CM | POA: Diagnosis not present

## 2021-12-09 DIAGNOSIS — D631 Anemia in chronic kidney disease: Secondary | ICD-10-CM | POA: Diagnosis not present

## 2021-12-09 DIAGNOSIS — R52 Pain, unspecified: Secondary | ICD-10-CM | POA: Diagnosis not present

## 2021-12-09 DIAGNOSIS — R519 Headache, unspecified: Secondary | ICD-10-CM | POA: Diagnosis not present

## 2021-12-09 DIAGNOSIS — N186 End stage renal disease: Secondary | ICD-10-CM | POA: Diagnosis not present

## 2021-12-11 DIAGNOSIS — N2581 Secondary hyperparathyroidism of renal origin: Secondary | ICD-10-CM | POA: Diagnosis not present

## 2021-12-11 DIAGNOSIS — D631 Anemia in chronic kidney disease: Secondary | ICD-10-CM | POA: Diagnosis not present

## 2021-12-11 DIAGNOSIS — N186 End stage renal disease: Secondary | ICD-10-CM | POA: Diagnosis not present

## 2021-12-11 DIAGNOSIS — Z992 Dependence on renal dialysis: Secondary | ICD-10-CM | POA: Diagnosis not present

## 2021-12-11 DIAGNOSIS — R519 Headache, unspecified: Secondary | ICD-10-CM | POA: Diagnosis not present

## 2021-12-11 DIAGNOSIS — L299 Pruritus, unspecified: Secondary | ICD-10-CM | POA: Diagnosis not present

## 2021-12-11 DIAGNOSIS — R52 Pain, unspecified: Secondary | ICD-10-CM | POA: Diagnosis not present

## 2021-12-14 DIAGNOSIS — L299 Pruritus, unspecified: Secondary | ICD-10-CM | POA: Diagnosis not present

## 2021-12-14 DIAGNOSIS — N186 End stage renal disease: Secondary | ICD-10-CM | POA: Diagnosis not present

## 2021-12-14 DIAGNOSIS — N2581 Secondary hyperparathyroidism of renal origin: Secondary | ICD-10-CM | POA: Diagnosis not present

## 2021-12-14 DIAGNOSIS — Z992 Dependence on renal dialysis: Secondary | ICD-10-CM | POA: Diagnosis not present

## 2021-12-14 DIAGNOSIS — R519 Headache, unspecified: Secondary | ICD-10-CM | POA: Diagnosis not present

## 2021-12-14 DIAGNOSIS — D631 Anemia in chronic kidney disease: Secondary | ICD-10-CM | POA: Diagnosis not present

## 2021-12-14 DIAGNOSIS — R52 Pain, unspecified: Secondary | ICD-10-CM | POA: Diagnosis not present

## 2021-12-15 DIAGNOSIS — G8929 Other chronic pain: Secondary | ICD-10-CM | POA: Diagnosis not present

## 2021-12-15 DIAGNOSIS — M47816 Spondylosis without myelopathy or radiculopathy, lumbar region: Secondary | ICD-10-CM | POA: Diagnosis not present

## 2021-12-15 DIAGNOSIS — M25561 Pain in right knee: Secondary | ICD-10-CM | POA: Diagnosis not present

## 2021-12-15 DIAGNOSIS — M5417 Radiculopathy, lumbosacral region: Secondary | ICD-10-CM | POA: Diagnosis not present

## 2021-12-15 DIAGNOSIS — M533 Sacrococcygeal disorders, not elsewhere classified: Secondary | ICD-10-CM | POA: Diagnosis not present

## 2021-12-15 DIAGNOSIS — Z79899 Other long term (current) drug therapy: Secondary | ICD-10-CM | POA: Diagnosis not present

## 2021-12-15 DIAGNOSIS — M5418 Radiculopathy, sacral and sacrococcygeal region: Secondary | ICD-10-CM | POA: Diagnosis not present

## 2021-12-15 DIAGNOSIS — M47812 Spondylosis without myelopathy or radiculopathy, cervical region: Secondary | ICD-10-CM | POA: Diagnosis not present

## 2021-12-15 DIAGNOSIS — M25511 Pain in right shoulder: Secondary | ICD-10-CM | POA: Diagnosis not present

## 2021-12-15 DIAGNOSIS — M549 Dorsalgia, unspecified: Secondary | ICD-10-CM | POA: Diagnosis not present

## 2021-12-15 DIAGNOSIS — Z5181 Encounter for therapeutic drug level monitoring: Secondary | ICD-10-CM | POA: Diagnosis not present

## 2021-12-15 DIAGNOSIS — M62830 Muscle spasm of back: Secondary | ICD-10-CM | POA: Diagnosis not present

## 2021-12-16 DIAGNOSIS — R519 Headache, unspecified: Secondary | ICD-10-CM | POA: Diagnosis not present

## 2021-12-16 DIAGNOSIS — N2581 Secondary hyperparathyroidism of renal origin: Secondary | ICD-10-CM | POA: Diagnosis not present

## 2021-12-16 DIAGNOSIS — N186 End stage renal disease: Secondary | ICD-10-CM | POA: Diagnosis not present

## 2021-12-16 DIAGNOSIS — R52 Pain, unspecified: Secondary | ICD-10-CM | POA: Diagnosis not present

## 2021-12-16 DIAGNOSIS — D631 Anemia in chronic kidney disease: Secondary | ICD-10-CM | POA: Diagnosis not present

## 2021-12-16 DIAGNOSIS — L299 Pruritus, unspecified: Secondary | ICD-10-CM | POA: Diagnosis not present

## 2021-12-16 DIAGNOSIS — Z992 Dependence on renal dialysis: Secondary | ICD-10-CM | POA: Diagnosis not present

## 2021-12-18 DIAGNOSIS — R519 Headache, unspecified: Secondary | ICD-10-CM | POA: Diagnosis not present

## 2021-12-18 DIAGNOSIS — R52 Pain, unspecified: Secondary | ICD-10-CM | POA: Diagnosis not present

## 2021-12-18 DIAGNOSIS — L299 Pruritus, unspecified: Secondary | ICD-10-CM | POA: Diagnosis not present

## 2021-12-18 DIAGNOSIS — N186 End stage renal disease: Secondary | ICD-10-CM | POA: Diagnosis not present

## 2021-12-18 DIAGNOSIS — D631 Anemia in chronic kidney disease: Secondary | ICD-10-CM | POA: Diagnosis not present

## 2021-12-18 DIAGNOSIS — Z992 Dependence on renal dialysis: Secondary | ICD-10-CM | POA: Diagnosis not present

## 2021-12-18 DIAGNOSIS — N2581 Secondary hyperparathyroidism of renal origin: Secondary | ICD-10-CM | POA: Diagnosis not present

## 2021-12-22 DIAGNOSIS — R52 Pain, unspecified: Secondary | ICD-10-CM | POA: Diagnosis not present

## 2021-12-22 DIAGNOSIS — N186 End stage renal disease: Secondary | ICD-10-CM | POA: Diagnosis not present

## 2021-12-22 DIAGNOSIS — Z992 Dependence on renal dialysis: Secondary | ICD-10-CM | POA: Diagnosis not present

## 2021-12-22 DIAGNOSIS — L299 Pruritus, unspecified: Secondary | ICD-10-CM | POA: Diagnosis not present

## 2021-12-22 DIAGNOSIS — R519 Headache, unspecified: Secondary | ICD-10-CM | POA: Diagnosis not present

## 2021-12-22 DIAGNOSIS — N2581 Secondary hyperparathyroidism of renal origin: Secondary | ICD-10-CM | POA: Diagnosis not present

## 2021-12-22 DIAGNOSIS — D631 Anemia in chronic kidney disease: Secondary | ICD-10-CM | POA: Diagnosis not present

## 2021-12-23 DIAGNOSIS — R52 Pain, unspecified: Secondary | ICD-10-CM | POA: Diagnosis not present

## 2021-12-23 DIAGNOSIS — R519 Headache, unspecified: Secondary | ICD-10-CM | POA: Diagnosis not present

## 2021-12-23 DIAGNOSIS — N2581 Secondary hyperparathyroidism of renal origin: Secondary | ICD-10-CM | POA: Diagnosis not present

## 2021-12-23 DIAGNOSIS — L299 Pruritus, unspecified: Secondary | ICD-10-CM | POA: Diagnosis not present

## 2021-12-23 DIAGNOSIS — D631 Anemia in chronic kidney disease: Secondary | ICD-10-CM | POA: Diagnosis not present

## 2021-12-23 DIAGNOSIS — Z992 Dependence on renal dialysis: Secondary | ICD-10-CM | POA: Diagnosis not present

## 2021-12-23 DIAGNOSIS — N186 End stage renal disease: Secondary | ICD-10-CM | POA: Diagnosis not present

## 2021-12-25 DIAGNOSIS — Z992 Dependence on renal dialysis: Secondary | ICD-10-CM | POA: Diagnosis not present

## 2021-12-25 DIAGNOSIS — R519 Headache, unspecified: Secondary | ICD-10-CM | POA: Diagnosis not present

## 2021-12-25 DIAGNOSIS — L299 Pruritus, unspecified: Secondary | ICD-10-CM | POA: Diagnosis not present

## 2021-12-25 DIAGNOSIS — R52 Pain, unspecified: Secondary | ICD-10-CM | POA: Diagnosis not present

## 2021-12-25 DIAGNOSIS — D631 Anemia in chronic kidney disease: Secondary | ICD-10-CM | POA: Diagnosis not present

## 2021-12-25 DIAGNOSIS — N186 End stage renal disease: Secondary | ICD-10-CM | POA: Diagnosis not present

## 2021-12-25 DIAGNOSIS — N2581 Secondary hyperparathyroidism of renal origin: Secondary | ICD-10-CM | POA: Diagnosis not present

## 2021-12-28 DIAGNOSIS — L299 Pruritus, unspecified: Secondary | ICD-10-CM | POA: Diagnosis not present

## 2021-12-28 DIAGNOSIS — R519 Headache, unspecified: Secondary | ICD-10-CM | POA: Diagnosis not present

## 2021-12-28 DIAGNOSIS — N186 End stage renal disease: Secondary | ICD-10-CM | POA: Diagnosis not present

## 2021-12-28 DIAGNOSIS — D631 Anemia in chronic kidney disease: Secondary | ICD-10-CM | POA: Diagnosis not present

## 2021-12-28 DIAGNOSIS — R52 Pain, unspecified: Secondary | ICD-10-CM | POA: Diagnosis not present

## 2021-12-28 DIAGNOSIS — Z992 Dependence on renal dialysis: Secondary | ICD-10-CM | POA: Diagnosis not present

## 2021-12-28 DIAGNOSIS — N2581 Secondary hyperparathyroidism of renal origin: Secondary | ICD-10-CM | POA: Diagnosis not present

## 2021-12-29 DIAGNOSIS — Z992 Dependence on renal dialysis: Secondary | ICD-10-CM | POA: Diagnosis not present

## 2021-12-29 DIAGNOSIS — N186 End stage renal disease: Secondary | ICD-10-CM | POA: Diagnosis not present

## 2021-12-29 DIAGNOSIS — N033 Chronic nephritic syndrome with diffuse mesangial proliferative glomerulonephritis: Secondary | ICD-10-CM | POA: Diagnosis not present

## 2021-12-31 ENCOUNTER — Ambulatory Visit (INDEPENDENT_AMBULATORY_CARE_PROVIDER_SITE_OTHER): Payer: Medicare Other | Admitting: Nurse Practitioner

## 2021-12-31 ENCOUNTER — Other Ambulatory Visit (HOSPITAL_COMMUNITY)
Admission: RE | Admit: 2021-12-31 | Discharge: 2021-12-31 | Disposition: A | Discharge status: Home / Self Care | Payer: Medicare Other | Source: Ambulatory Visit | Attending: Nurse Practitioner | Admitting: Nurse Practitioner

## 2021-12-31 ENCOUNTER — Encounter: Payer: Self-pay | Admitting: Nurse Practitioner

## 2021-12-31 ENCOUNTER — Other Ambulatory Visit: Payer: Self-pay

## 2021-12-31 VITALS — BP 145/81 | HR 79 | Temp 98.0°F | Ht 64.0 in | Wt 177.8 lb

## 2021-12-31 DIAGNOSIS — Z01419 Encounter for gynecological examination (general) (routine) without abnormal findings: Secondary | ICD-10-CM

## 2021-12-31 DIAGNOSIS — G43109 Migraine with aura, not intractable, without status migrainosus: Secondary | ICD-10-CM

## 2021-12-31 DIAGNOSIS — Z6830 Body mass index (BMI) 30.0-30.9, adult: Secondary | ICD-10-CM

## 2021-12-31 DIAGNOSIS — N6452 Nipple discharge: Secondary | ICD-10-CM

## 2021-12-31 DIAGNOSIS — I770 Arteriovenous fistula, acquired: Secondary | ICD-10-CM

## 2021-12-31 DIAGNOSIS — D696 Thrombocytopenia, unspecified: Secondary | ICD-10-CM

## 2021-12-31 DIAGNOSIS — N644 Mastodynia: Secondary | ICD-10-CM | POA: Diagnosis not present

## 2021-12-31 DIAGNOSIS — R102 Pelvic and perineal pain: Secondary | ICD-10-CM

## 2021-12-31 DIAGNOSIS — Z8742 Personal history of other diseases of the female genital tract: Secondary | ICD-10-CM

## 2021-12-31 DIAGNOSIS — Z1151 Encounter for screening for human papillomavirus (HPV): Secondary | ICD-10-CM | POA: Diagnosis not present

## 2021-12-31 MED ORDER — SUMATRIPTAN SUCCINATE 50 MG PO TABS: 50.0000 mg | ORAL_TABLET | Freq: Every day | ORAL | 5 refills | Status: DC | PRN

## 2021-12-31 NOTE — Progress Notes (Signed)
Established patient visit   Patient: [name]   DOB: 1970-11-14   51 y.o. Female  MRN: [id number] Visit Date: 12/31/2021   Chief Complaint  Patient presents with   Annual Exam   Gynecologic Exam   Subjective    HPI  The patient is here for annual wellness visit.  -tearful at beginning of visit. States husband is not doing well with his health. Her cat passed away yesterday.  -patient does have tenderness in both breasts. Has had discharge from the right breast intermittently. She has not had a mammogram in a few years.  -needs refills for her imitrex.  -pelvic pain. Has had partial hysterectomy. History of bilateral ovarian cysts.    Medications: Outpatient Medications Prior to Visit  Medication Sig   acetaminophen (TYLENOL) 325 MG tablet Take 2 tablets (650 mg total) by mouth every 8 (eight) hours as needed for mild pain, fever or headache.   albuterol (PROVENTIL HFA;VENTOLIN HFA) 108 (90 Base) MCG/ACT inhaler Inhale 2 puffs into the lungs every 6 (six) hours as needed for shortness of breath.   ALPRAZolam (XANAX) 1 MG tablet Take 1 tablet (1 mg total) by mouth 3 (three) times daily.   amLODipine (NORVASC) 10 MG tablet Take 10 mg by mouth at bedtime.    budesonide-formoterol (SYMBICORT) 160-4.5 MCG/ACT inhaler Inhale 2 puffs into the lungs 2 (two) times daily.   cyclobenzaprine (FLEXERIL) 10 MG tablet Take 20 mg by mouth at bedtime.    doxycycline (VIBRAMYCIN) 100 MG capsule Take 1 capsule (100 mg total) by mouth 2 (two) times daily.   furosemide (LASIX) 80 MG tablet Take 160 mg by mouth daily.   hydrALAZINE (APRESOLINE) 50 MG tablet Take 50 mg by mouth 3 (three) times daily.   HYDROmorphone (DILAUDID) 2 MG tablet Take 0.5 tablets (1 mg total) by mouth every 6 (six) hours as needed for moderate pain or severe pain.   lidocaine-prilocaine (EMLA) cream Apply 1 application topically See admin instructions. Apply topically one hour prior to dialysis - Monday, Wednesday,  Friday   lisinopril (PRINIVIL,ZESTRIL) 40 MG tablet Take 40 mg by mouth daily.   Melatonin 5 MG TABS Take 5 mg by mouth at bedtime.    methocarbamol (ROBAXIN) 500 MG tablet Take 500 mg by mouth 2 (two) times daily.   montelukast (SINGULAIR) 10 MG tablet Take 1 tablet (10 mg total) by mouth daily.   multivitamin (RENA-VIT) TABS tablet Take 1 tablet by mouth daily.   mupirocin ointment (BACTROBAN) 2 % Apply te effected areas twice daily   naloxone (NARCAN) nasal spray 4 mg/0.1 mL Place 1 spray into the nose daily as needed (overdose).   promethazine (PHENERGAN) 25 MG tablet Take 1 tablet (25 mg total) by mouth 2 (two) times daily as needed for nausea or vomiting.   sevelamer carbonate (RENVELA) 800 MG tablet Take 1,600-2,400 mg by mouth daily as needed (when eating). Take 3 tablets with meals and 2 tablets with snacks   [DISCONTINUED] busPIRone (BUSPAR) 10 MG tablet Take 1 tablet (10 mg total) by mouth 3 (three) times daily.   [DISCONTINUED] calcium acetate (PHOSLO) 667 MG capsule Take 1,334-2,001 mg by mouth daily as needed (when eating). 3 capsules with meals and 2 capsules with snacks   [DISCONTINUED] metoprolol tartrate (LOPRESSOR) 25 MG tablet Take 1 tablet (25 mg total) by mouth 2 (two) times daily.   [DISCONTINUED] nicotine (NICODERM CQ - DOSED IN MG/24 HOURS) 14 mg/24hr patch Place 1 patch (14 mg total) onto the skin daily.   [  DISCONTINUED] SUMAtriptan (IMITREX) 50 MG tablet Take 50 mg by mouth daily as needed for migraine or headache.    No facility-administered medications prior to visit.    Review of Systems  Constitutional:  Positive for fatigue. Negative for activity change, appetite change, chills and fever.  HENT:  Negative for congestion, postnasal drip, rhinorrhea, sinus pressure, sinus pain, sneezing and sore throat.   Eyes: Negative.   Respiratory:  Positive for shortness of breath. Negative for cough, chest tightness and wheezing.   Cardiovascular:  Negative for chest pain  and palpitations.       Elevated blood pressure   Gastrointestinal:  Negative for abdominal pain, constipation, diarrhea, nausea and vomiting.  Endocrine: Negative for cold intolerance, heat intolerance, polydipsia and polyuria.  Genitourinary:  Positive for pelvic pain. Negative for dyspareunia, dysuria, flank pain, frequency and urgency.       Tenderness of both breasts   Musculoskeletal:  Negative for arthralgias, back pain and myalgias.  Skin:  Negative for rash.  Allergic/Immunologic: Negative for environmental allergies.  Neurological:  Negative for dizziness, weakness and headaches.  Hematological:  Negative for adenopathy.  Psychiatric/Behavioral:  Positive for dysphoric mood. The patient is nervous/anxious.       Objective     Today's Vitals   12/31/21 1021  BP: (!) 145/81  Pulse: 79  Temp: 98 F (36.7 C)  SpO2: 97%  Weight: 177 lb 12.8 oz (80.6 kg)   Body mass index is 30.52 kg/m.   BP Readings from Last 3 Encounters:  12/31/21 (!) 145/81  12/03/21 (!) 145/88  01/29/20 (!) 175/97    Wt Readings from Last 3 Encounters:  12/31/21 177 lb 12.8 oz (80.6 kg)  12/03/21 173 lb 9 oz (78.7 kg)  01/29/20 136 lb (61.7 kg)    Physical Exam Vitals and nursing note reviewed. Exam conducted with a chaperone present.  Constitutional:      Appearance: Normal appearance. She is well-developed.  HENT:     Head: Normocephalic and atraumatic.     Right Ear: Tympanic membrane, ear canal and external ear normal.     Left Ear: Tympanic membrane, ear canal and external ear normal.     Nose: Nose normal.     Mouth/Throat:     Mouth: Mucous membranes are moist.     Pharynx: Oropharynx is clear.  Eyes:     Extraocular Movements: Extraocular movements intact.     Conjunctiva/sclera: Conjunctivae normal.     Pupils: Pupils are equal, round, and reactive to light.  Neck:     Vascular: No carotid bruit.  Cardiovascular:     Rate and Rhythm: Normal rate and regular rhythm.      Pulses: Normal pulses.     Heart sounds: Normal heart sounds.     Comments: Intact left arm AV fistula with palpable bruit.  Pulmonary:     Effort: Pulmonary effort is normal.     Breath sounds: Normal breath sounds.  Chest:  Breasts:    Right: Tenderness present. No swelling, bleeding, inverted nipple, mass or nipple discharge.     Left: Tenderness present. No swelling, bleeding, inverted nipple, mass, nipple discharge or skin change.     Comments: Generalized, bilateral breast tenderness.  Abdominal:     General: Bowel sounds are normal. There is no distension.     Palpations: Abdomen is soft. There is no mass.     Tenderness: There is no abdominal tenderness. There is no guarding or rebound.     Hernia: No  hernia is present. There is no hernia in the left inguinal area or right inguinal area.  Genitourinary:    General: Normal vulva.     Exam position: Supine.     Labia:        Right: No rash, tenderness or lesion.        Left: No rash, tenderness or lesion.      Vagina: No signs of injury and foreign body. No vaginal discharge, erythema, tenderness or bleeding.     Cervix: No cervical motion tenderness, friability, lesion or erythema.     Uterus: Not deviated, not enlarged and not fixed.      Adnexa:        Right: Tenderness present.        Left: Tenderness present.      Comments: There is generalized pelvic pain with bimanual exam. Musculoskeletal:        General: Normal range of motion.     Cervical back: Normal range of motion and neck supple.  Lymphadenopathy:     Cervical: No cervical adenopathy.     Upper Body:     Right upper body: No axillary adenopathy.     Left upper body: No axillary adenopathy.     Lower Body: No right inguinal adenopathy. No left inguinal adenopathy.  Skin:    General: Skin is warm and dry.     Capillary Refill: Capillary refill takes less than 2 seconds.  Neurological:     General: No focal deficit present.     Mental Status: She is alert  and oriented to person, place, and time.  Psychiatric:        Mood and Affect: Mood normal.        Behavior: Behavior normal.        Thought Content: Thought content normal.        Judgment: Judgment normal.     Results for orders placed or performed in visit on 12/31/21  Cytology - PAP( [place])  Result Value Ref Range   High risk HPV Negative    Adequacy      Satisfactory for evaluation; transformation zone component ABSENT.   Diagnosis      - Negative for intraepithelial lesion or malignancy (NILM)   Comment Normal Reference Range HPV - Negative     Assessment & Plan    1. Well woman exam Annual wellness visit today.  Pap smear obtained during today's visit. - Cytology - PAP( [place])  2. Breast pain in female Generalized, bilateral breast pain.  History we will get diagnostic, bilateral mammogram. - MM Digital Diagnostic Bilat; Future  3. Nipple discharge in female History of nipple discharge along with breast tenderness.  Bilateral, diagnostic mammogram. - MM Digital Diagnostic Bilat; Future  4. Pelvic pain Bilateral pelvic pain during bimanual exam.  We will get transvaginal pelvic ultrasound for further evaluation. - US Pelvic Complete With Transvaginal; Future  5. History of ovarian cyst We will get transvaginal pelvic ultrasound for further evaluation. - US Pelvic Complete With Transvaginal; Future  6. Migraine with aura and without status migrainosus, not intractable Patient maintaining Imitrex as needed and as prescribed for acute migraine headache. - SUMAtriptan (IMITREX) 50 MG tablet; Take 1 tablet (50 mg total) by mouth daily as needed for migraine or headache.  Dispense: 10 tablet; Refill: 5  7. Body mass index (BMI) of 30.0-30.9 in adult Discussed lowering calorie intake to 1500 calories per day and incorporating exercise into daily routine to help lose weight.  8. A-V fistula ([place]) Intact AV fistula for hemodialysis.   Problem List Items  Addressed This Visit       Cardiovascular and Mediastinum   A-V fistula (HCC)   Migraine with aura and without status migrainosus, not intractable   Relevant Medications   SUMAtriptan (IMITREX) 50 MG tablet     Hematopoietic and Hemostatic   Thrombocytopenia, unspecified (HCC)     Other   Breast pain in female   Relevant Orders   MM Digital Diagnostic Bilat   Nipple discharge in female   Relevant Orders   MM Digital Diagnostic Bilat   Pelvic pain   Relevant Orders   US Pelvic Complete With Transvaginal   History of ovarian cyst   Relevant Orders   US Pelvic Complete With Transvaginal   Body mass index (BMI) of 30.0-30.9 in adult   Other Visit Diagnoses     Well woman exam    -  Primary   Relevant Orders   Cytology - PAP( [place]) (Completed)        Return in about 6 months (around 07/03/2022) for  mood -  please get labs from [facility]. TY.,.         Ronnell Freshwater, NP  [facility] at [facility] [phone] (phone) [phone] (fax)  [place]

## 2021-12-31 NOTE — Patient Instructions (Addendum)
Fat and Cholesterol Restricted Eating Plan Getting too much fat and cholesterol in your diet may cause health problems. Choosing the right foods helps keep your fat and cholesterol at normal levels. This can keep you from getting certain diseases. Your doctor may recommend an eating plan that includes: Total fat: ______% or less of total calories a day. This is ______g of fat a day. Saturated fat: ______% or less of total calories a day. This is ______g of saturated fat a day. Cholesterol: less than _________mg a day. Fiber: ______g a day. What are tips for following this plan? General tips Work with your doctor to lose weight if you need to. Avoid: Foods with added sugar. Fried foods. Foods with trans fat or partially hydrogenated oils. This includes some margarines and baked goods. If you drink alcohol: Limit how much you have to: 0-1 drink a day for women who are not pregnant. 0-2 drinks a day for men. Know how much alcohol is in a drink. In the [place] one drink equals one 12 oz bottle of beer (355 mL), one 5 oz glass of wine (148 mL), or one 1 oz glass of hard liquor (44 mL). Reading food labels Check food labels for: Trans fats. Partially hydrogenated oils. Saturated fat (g) in each serving. Cholesterol (mg) in each serving. Fiber (g) in each serving. Choose foods with healthy fats, such as: Monounsaturated fats and polyunsaturated fats. These include olive and canola oil, flaxseeds, walnuts, almonds, and seeds. Omega-3 fats. These are found in certain fish, flaxseed oil, and ground flaxseeds. Choose grain products that have whole grains. Look for the word "whole" as the first word in the ingredient list. Cooking Cook foods using low-fat methods. These include baking, boiling, grilling, and broiling. Eat more home-cooked foods. Eat at restaurants and buffets less often. Eat less fast food. Avoid cooking using saturated fats, such as butter, cream, palm oil, palm kernel oil, and  coconut oil. Meal planning  At meals, divide your plate into four equal parts: Fill one-half of your plate with vegetables, green salads, and fruit. Fill one-fourth of your plate with whole grains. Fill one-fourth of your plate with low-fat (lean) protein foods. Eat fish that is high in omega-3 fats at least two times a week. This includes mackerel, tuna, sardines, and salmon. Eat foods that are high in fiber, such as whole grains, beans, apples, pears, berries, broccoli, carrots, peas, and barley. What foods should I eat? Fruits All fresh, canned (in natural juice), or frozen fruits. Vegetables Fresh or frozen vegetables (raw, steamed, roasted, or grilled). Green salads. Grains Whole grains, such as whole wheat or whole grain breads, crackers, cereals, and pasta. Unsweetened oatmeal, bulgur, barley, quinoa, or brown rice. Corn or whole wheat flour tortillas. Meats and other protein foods Ground beef (85% or leaner), grass-fed beef, or beef trimmed of fat. Skinless chicken or [place]. Ground chicken or [place]. Pork trimmed of fat. All fish and seafood. Egg whites. Dried beans, peas, or lentils. Unsalted nuts or seeds. Unsalted canned beans. Nut butters without added sugar or oil. Dairy Low-fat or nonfat dairy products, such as skim or 1% milk, 2% or reduced-fat cheeses, low-fat and fat-free ricotta or cottage cheese, or plain low-fat and nonfat yogurt. Fats and oils Tub margarine without trans fats. Light or reduced-fat mayonnaise and salad dressings. Avocado. Olive, canola, sesame, or safflower oils. The items listed above may not be a complete list of foods and beverages you can eat. Contact a dietitian for more information. What foods  should I avoid? Fruits Canned fruit in heavy syrup. Fruit in cream or butter sauce. Fried fruit. Vegetables Vegetables cooked in cheese, cream, or butter sauce. Fried vegetables. Grains White bread. White pasta. White rice. Cornbread. Bagels, pastries,  and croissants. Crackers and snack foods that contain trans fat and hydrogenated oils. Meats and other protein foods Fatty cuts of meat. Ribs, chicken wings, bacon, sausage, bologna, salami, chitterlings, fatback, hot dogs, bratwurst, and packaged lunch meats. Liver and organ meats. Whole eggs and egg yolks. Chicken and [place] with skin. Fried meat. Dairy Whole or 2% milk, cream, half-and-half, and cream cheese. Whole milk cheeses. Whole-fat or sweetened yogurt. Full-fat cheeses. Nondairy creamers and whipped toppings. Processed cheese, cheese spreads, and cheese curds. Fats and oils Butter, stick margarine, lard, shortening, ghee, or bacon fat. Coconut, palm kernel, and palm oils. Beverages Alcohol. Sugar-sweetened drinks such as sodas, lemonade, and fruit drinks. Sweets and desserts Corn syrup, sugars, honey, and molasses. Candy. Jam and jelly. Syrup. Sweetened cereals. Cookies, pies, cakes, donuts, muffins, and ice cream. The items listed above may not be a complete list of foods and beverages you should avoid. Contact a dietitian for more information. Summary Choosing the right foods helps keep your fat and cholesterol at normal levels. This can keep you from getting certain diseases. At meals, fill one-half of your plate with vegetables, green salads, and fruits. Eat high fiber foods, like whole grains, beans, apples, pears, berries, carrots, peas, and barley. Limit added sugar, saturated fats, alcohol, and fried foods. This information is not intended to replace advice given to you by your health care provider. Make sure you discuss any questions you have with your health care provider. Document Revised: 02/27/2021 Document Reviewed: 02/27/2021 Elsevier Patient Education  2022 [place] and Cholesterol Restricted Eating Plan Getting too much fat and cholesterol in your diet may cause health problems. Choosing the right foods helps keep your fat and cholesterol at normal levels.  This can keep you from getting certain diseases. Your doctor may recommend an eating plan that includes: Total fat: ______% or less of total calories a day. This is ______g of fat a day. Saturated fat: ______% or less of total calories a day. This is ______g of saturated fat a day. Cholesterol: less than _________mg a day. Fiber: ______g a day. What are tips for following this plan? General tips Work with your doctor to lose weight if you need to. Avoid: Foods with added sugar. Fried foods. Foods with trans fat or partially hydrogenated oils. This includes some margarines and baked goods. If you drink alcohol: Limit how much you have to: 0-1 drink a day for women who are not pregnant. 0-2 drinks a day for men. Know how much alcohol is in a drink. In the [place] one drink equals one 12 oz bottle of beer (355 mL), one 5 oz glass of wine (148 mL), or one 1 oz glass of hard liquor (44 mL). Reading food labels Check food labels for: Trans fats. Partially hydrogenated oils. Saturated fat (g) in each serving. Cholesterol (mg) in each serving. Fiber (g) in each serving. Choose foods with healthy fats, such as: Monounsaturated fats and polyunsaturated fats. These include olive and canola oil, flaxseeds, walnuts, almonds, and seeds. Omega-3 fats. These are found in certain fish, flaxseed oil, and ground flaxseeds. Choose grain products that have whole grains. Look for the word "whole" as the first word in the ingredient list. Cooking Cook foods using low-fat methods. These include baking, boiling, grilling,  and broiling. Eat more home-cooked foods. Eat at restaurants and buffets less often. Eat less fast food. Avoid cooking using saturated fats, such as butter, cream, palm oil, palm kernel oil, and coconut oil. Meal planning  At meals, divide your plate into four equal parts: Fill one-half of your plate with vegetables, green salads, and fruit. Fill one-fourth of your plate with whole  grains. Fill one-fourth of your plate with low-fat (lean) protein foods. Eat fish that is high in omega-3 fats at least two times a week. This includes mackerel, tuna, sardines, and salmon. Eat foods that are high in fiber, such as whole grains, beans, apples, pears, berries, broccoli, carrots, peas, and barley. What foods should I eat? Fruits All fresh, canned (in natural juice), or frozen fruits. Vegetables Fresh or frozen vegetables (raw, steamed, roasted, or grilled). Green salads. Grains Whole grains, such as whole wheat or whole grain breads, crackers, cereals, and pasta. Unsweetened oatmeal, bulgur, barley, quinoa, or brown rice. Corn or whole wheat flour tortillas. Meats and other protein foods Ground beef (85% or leaner), grass-fed beef, or beef trimmed of fat. Skinless chicken or [place]. Ground chicken or [place]. Pork trimmed of fat. All fish and seafood. Egg whites. Dried beans, peas, or lentils. Unsalted nuts or seeds. Unsalted canned beans. Nut butters without added sugar or oil. Dairy Low-fat or nonfat dairy products, such as skim or 1% milk, 2% or reduced-fat cheeses, low-fat and fat-free ricotta or cottage cheese, or plain low-fat and nonfat yogurt. Fats and oils Tub margarine without trans fats. Light or reduced-fat mayonnaise and salad dressings. Avocado. Olive, canola, sesame, or safflower oils. The items listed above may not be a complete list of foods and beverages you can eat. Contact a dietitian for more information. What foods should I avoid? Fruits Canned fruit in heavy syrup. Fruit in cream or butter sauce. Fried fruit. Vegetables Vegetables cooked in cheese, cream, or butter sauce. Fried vegetables. Grains White bread. White pasta. White rice. Cornbread. Bagels, pastries, and croissants. Crackers and snack foods that contain trans fat and hydrogenated oils. Meats and other protein foods Fatty cuts of meat. Ribs, chicken wings, bacon, sausage, bologna, salami,  chitterlings, fatback, hot dogs, bratwurst, and packaged lunch meats. Liver and organ meats. Whole eggs and egg yolks. Chicken and [place] with skin. Fried meat. Dairy Whole or 2% milk, cream, half-and-half, and cream cheese. Whole milk cheeses. Whole-fat or sweetened yogurt. Full-fat cheeses. Nondairy creamers and whipped toppings. Processed cheese, cheese spreads, and cheese curds. Fats and oils Butter, stick margarine, lard, shortening, ghee, or bacon fat. Coconut, palm kernel, and palm oils. Beverages Alcohol. Sugar-sweetened drinks such as sodas, lemonade, and fruit drinks. Sweets and desserts Corn syrup, sugars, honey, and molasses. Candy. Jam and jelly. Syrup. Sweetened cereals. Cookies, pies, cakes, donuts, muffins, and ice cream. The items listed above may not be a complete list of foods and beverages you should avoid. Contact a dietitian for more information. Summary Choosing the right foods helps keep your fat and cholesterol at normal levels. This can keep you from getting certain diseases. At meals, fill one-half of your plate with vegetables, green salads, and fruits. Eat high fiber foods, like whole grains, beans, apples, pears, berries, carrots, peas, and barley. Limit added sugar, saturated fats, alcohol, and fried foods. This information is not intended to replace advice given to you by your health care provider. Make sure you discuss any questions you have with your health care provider. Document Revised: 02/27/2021 Document Reviewed: 02/27/2021 Elsevier Patient Education  [place].

## 2022-01-01 DIAGNOSIS — R519 Headache, unspecified: Secondary | ICD-10-CM | POA: Diagnosis not present

## 2022-01-01 DIAGNOSIS — R52 Pain, unspecified: Secondary | ICD-10-CM | POA: Diagnosis not present

## 2022-01-01 DIAGNOSIS — N186 End stage renal disease: Secondary | ICD-10-CM | POA: Diagnosis not present

## 2022-01-01 DIAGNOSIS — L299 Pruritus, unspecified: Secondary | ICD-10-CM | POA: Diagnosis not present

## 2022-01-01 DIAGNOSIS — Z992 Dependence on renal dialysis: Secondary | ICD-10-CM | POA: Diagnosis not present

## 2022-01-01 DIAGNOSIS — N2581 Secondary hyperparathyroidism of renal origin: Secondary | ICD-10-CM | POA: Diagnosis not present

## 2022-01-01 DIAGNOSIS — D631 Anemia in chronic kidney disease: Secondary | ICD-10-CM | POA: Diagnosis not present

## 2022-01-01 LAB — CYTOLOGY - PAP
Adequacy: ABSENT
Comment: NEGATIVE
Diagnosis: NEGATIVE
High risk HPV: NEGATIVE

## 2022-01-03 ENCOUNTER — Other Ambulatory Visit: Payer: Self-pay | Admitting: Nurse Practitioner

## 2022-01-03 DIAGNOSIS — F411 Generalized anxiety disorder: Secondary | ICD-10-CM

## 2022-01-04 DIAGNOSIS — Z992 Dependence on renal dialysis: Secondary | ICD-10-CM | POA: Diagnosis not present

## 2022-01-04 DIAGNOSIS — R52 Pain, unspecified: Secondary | ICD-10-CM | POA: Diagnosis not present

## 2022-01-04 DIAGNOSIS — N2581 Secondary hyperparathyroidism of renal origin: Secondary | ICD-10-CM | POA: Diagnosis not present

## 2022-01-04 DIAGNOSIS — R519 Headache, unspecified: Secondary | ICD-10-CM | POA: Diagnosis not present

## 2022-01-04 DIAGNOSIS — L299 Pruritus, unspecified: Secondary | ICD-10-CM | POA: Diagnosis not present

## 2022-01-04 DIAGNOSIS — D631 Anemia in chronic kidney disease: Secondary | ICD-10-CM | POA: Diagnosis not present

## 2022-01-04 DIAGNOSIS — N186 End stage renal disease: Secondary | ICD-10-CM | POA: Diagnosis not present

## 2022-01-04 NOTE — Progress Notes (Signed)
Please let the patient know that she had a normal pap smear.  ?Thanks so much.   -HB

## 2022-01-06 DIAGNOSIS — D631 Anemia in chronic kidney disease: Secondary | ICD-10-CM | POA: Diagnosis not present

## 2022-01-06 DIAGNOSIS — L299 Pruritus, unspecified: Secondary | ICD-10-CM | POA: Diagnosis not present

## 2022-01-06 DIAGNOSIS — R519 Headache, unspecified: Secondary | ICD-10-CM | POA: Diagnosis not present

## 2022-01-06 DIAGNOSIS — R52 Pain, unspecified: Secondary | ICD-10-CM | POA: Diagnosis not present

## 2022-01-06 DIAGNOSIS — N2581 Secondary hyperparathyroidism of renal origin: Secondary | ICD-10-CM | POA: Diagnosis not present

## 2022-01-06 DIAGNOSIS — N186 End stage renal disease: Secondary | ICD-10-CM | POA: Diagnosis not present

## 2022-01-06 DIAGNOSIS — Z992 Dependence on renal dialysis: Secondary | ICD-10-CM | POA: Diagnosis not present

## 2022-01-08 DIAGNOSIS — N186 End stage renal disease: Secondary | ICD-10-CM | POA: Diagnosis not present

## 2022-01-08 DIAGNOSIS — D631 Anemia in chronic kidney disease: Secondary | ICD-10-CM | POA: Diagnosis not present

## 2022-01-08 DIAGNOSIS — N2581 Secondary hyperparathyroidism of renal origin: Secondary | ICD-10-CM | POA: Diagnosis not present

## 2022-01-08 DIAGNOSIS — R519 Headache, unspecified: Secondary | ICD-10-CM | POA: Diagnosis not present

## 2022-01-08 DIAGNOSIS — L299 Pruritus, unspecified: Secondary | ICD-10-CM | POA: Diagnosis not present

## 2022-01-08 DIAGNOSIS — R52 Pain, unspecified: Secondary | ICD-10-CM | POA: Diagnosis not present

## 2022-01-08 DIAGNOSIS — Z992 Dependence on renal dialysis: Secondary | ICD-10-CM | POA: Diagnosis not present

## 2022-01-09 DIAGNOSIS — G43109 Migraine with aura, not intractable, without status migrainosus: Secondary | ICD-10-CM | POA: Insufficient documentation

## 2022-01-09 DIAGNOSIS — N644 Mastodynia: Secondary | ICD-10-CM | POA: Insufficient documentation

## 2022-01-09 DIAGNOSIS — R102 Pelvic and perineal pain: Secondary | ICD-10-CM | POA: Insufficient documentation

## 2022-01-09 DIAGNOSIS — Z8742 Personal history of other diseases of the female genital tract: Secondary | ICD-10-CM | POA: Insufficient documentation

## 2022-01-09 DIAGNOSIS — I770 Arteriovenous fistula, acquired: Secondary | ICD-10-CM | POA: Insufficient documentation

## 2022-01-09 DIAGNOSIS — N6452 Nipple discharge: Secondary | ICD-10-CM | POA: Insufficient documentation

## 2022-01-09 DIAGNOSIS — D696 Thrombocytopenia, unspecified: Secondary | ICD-10-CM | POA: Insufficient documentation

## 2022-01-09 DIAGNOSIS — Z6830 Body mass index (BMI) 30.0-30.9, adult: Secondary | ICD-10-CM | POA: Insufficient documentation

## 2022-01-11 DIAGNOSIS — R519 Headache, unspecified: Secondary | ICD-10-CM | POA: Diagnosis not present

## 2022-01-11 DIAGNOSIS — N2581 Secondary hyperparathyroidism of renal origin: Secondary | ICD-10-CM | POA: Diagnosis not present

## 2022-01-11 DIAGNOSIS — Z992 Dependence on renal dialysis: Secondary | ICD-10-CM | POA: Diagnosis not present

## 2022-01-11 DIAGNOSIS — R52 Pain, unspecified: Secondary | ICD-10-CM | POA: Diagnosis not present

## 2022-01-11 DIAGNOSIS — L299 Pruritus, unspecified: Secondary | ICD-10-CM | POA: Diagnosis not present

## 2022-01-11 DIAGNOSIS — N186 End stage renal disease: Secondary | ICD-10-CM | POA: Diagnosis not present

## 2022-01-11 DIAGNOSIS — D631 Anemia in chronic kidney disease: Secondary | ICD-10-CM | POA: Diagnosis not present

## 2022-01-12 ENCOUNTER — Other Ambulatory Visit: Payer: Self-pay | Admitting: Nurse Practitioner

## 2022-01-12 DIAGNOSIS — N644 Mastodynia: Secondary | ICD-10-CM

## 2022-01-12 DIAGNOSIS — N6452 Nipple discharge: Secondary | ICD-10-CM

## 2022-01-13 DIAGNOSIS — D631 Anemia in chronic kidney disease: Secondary | ICD-10-CM | POA: Diagnosis not present

## 2022-01-13 DIAGNOSIS — Z992 Dependence on renal dialysis: Secondary | ICD-10-CM | POA: Diagnosis not present

## 2022-01-13 DIAGNOSIS — N186 End stage renal disease: Secondary | ICD-10-CM | POA: Diagnosis not present

## 2022-01-13 DIAGNOSIS — L299 Pruritus, unspecified: Secondary | ICD-10-CM | POA: Diagnosis not present

## 2022-01-13 DIAGNOSIS — R52 Pain, unspecified: Secondary | ICD-10-CM | POA: Diagnosis not present

## 2022-01-13 DIAGNOSIS — R519 Headache, unspecified: Secondary | ICD-10-CM | POA: Diagnosis not present

## 2022-01-13 DIAGNOSIS — N2581 Secondary hyperparathyroidism of renal origin: Secondary | ICD-10-CM | POA: Diagnosis not present

## 2022-01-15 DIAGNOSIS — R519 Headache, unspecified: Secondary | ICD-10-CM | POA: Diagnosis not present

## 2022-01-15 DIAGNOSIS — R52 Pain, unspecified: Secondary | ICD-10-CM | POA: Diagnosis not present

## 2022-01-15 DIAGNOSIS — N186 End stage renal disease: Secondary | ICD-10-CM | POA: Diagnosis not present

## 2022-01-15 DIAGNOSIS — D631 Anemia in chronic kidney disease: Secondary | ICD-10-CM | POA: Diagnosis not present

## 2022-01-15 DIAGNOSIS — Z992 Dependence on renal dialysis: Secondary | ICD-10-CM | POA: Diagnosis not present

## 2022-01-15 DIAGNOSIS — N2581 Secondary hyperparathyroidism of renal origin: Secondary | ICD-10-CM | POA: Diagnosis not present

## 2022-01-15 DIAGNOSIS — L299 Pruritus, unspecified: Secondary | ICD-10-CM | POA: Diagnosis not present

## 2022-01-18 DIAGNOSIS — L299 Pruritus, unspecified: Secondary | ICD-10-CM | POA: Diagnosis not present

## 2022-01-18 DIAGNOSIS — R52 Pain, unspecified: Secondary | ICD-10-CM | POA: Diagnosis not present

## 2022-01-18 DIAGNOSIS — R519 Headache, unspecified: Secondary | ICD-10-CM | POA: Diagnosis not present

## 2022-01-18 DIAGNOSIS — N2581 Secondary hyperparathyroidism of renal origin: Secondary | ICD-10-CM | POA: Diagnosis not present

## 2022-01-18 DIAGNOSIS — Z992 Dependence on renal dialysis: Secondary | ICD-10-CM | POA: Diagnosis not present

## 2022-01-18 DIAGNOSIS — N186 End stage renal disease: Secondary | ICD-10-CM | POA: Diagnosis not present

## 2022-01-18 DIAGNOSIS — D631 Anemia in chronic kidney disease: Secondary | ICD-10-CM | POA: Diagnosis not present

## 2022-01-20 DIAGNOSIS — N2581 Secondary hyperparathyroidism of renal origin: Secondary | ICD-10-CM | POA: Diagnosis not present

## 2022-01-20 DIAGNOSIS — N186 End stage renal disease: Secondary | ICD-10-CM | POA: Diagnosis not present

## 2022-01-20 DIAGNOSIS — L299 Pruritus, unspecified: Secondary | ICD-10-CM | POA: Diagnosis not present

## 2022-01-20 DIAGNOSIS — Z992 Dependence on renal dialysis: Secondary | ICD-10-CM | POA: Diagnosis not present

## 2022-01-20 DIAGNOSIS — R52 Pain, unspecified: Secondary | ICD-10-CM | POA: Diagnosis not present

## 2022-01-20 DIAGNOSIS — R519 Headache, unspecified: Secondary | ICD-10-CM | POA: Diagnosis not present

## 2022-01-20 DIAGNOSIS — D631 Anemia in chronic kidney disease: Secondary | ICD-10-CM | POA: Diagnosis not present

## 2022-01-22 DIAGNOSIS — R519 Headache, unspecified: Secondary | ICD-10-CM | POA: Diagnosis not present

## 2022-01-22 DIAGNOSIS — R52 Pain, unspecified: Secondary | ICD-10-CM | POA: Diagnosis not present

## 2022-01-22 DIAGNOSIS — L299 Pruritus, unspecified: Secondary | ICD-10-CM | POA: Diagnosis not present

## 2022-01-22 DIAGNOSIS — D631 Anemia in chronic kidney disease: Secondary | ICD-10-CM | POA: Diagnosis not present

## 2022-01-22 DIAGNOSIS — N2581 Secondary hyperparathyroidism of renal origin: Secondary | ICD-10-CM | POA: Diagnosis not present

## 2022-01-22 DIAGNOSIS — N186 End stage renal disease: Secondary | ICD-10-CM | POA: Diagnosis not present

## 2022-01-22 DIAGNOSIS — Z992 Dependence on renal dialysis: Secondary | ICD-10-CM | POA: Diagnosis not present

## 2022-01-26 DIAGNOSIS — N186 End stage renal disease: Secondary | ICD-10-CM | POA: Diagnosis not present

## 2022-01-26 DIAGNOSIS — L299 Pruritus, unspecified: Secondary | ICD-10-CM | POA: Diagnosis not present

## 2022-01-26 DIAGNOSIS — R52 Pain, unspecified: Secondary | ICD-10-CM | POA: Diagnosis not present

## 2022-01-26 DIAGNOSIS — R519 Headache, unspecified: Secondary | ICD-10-CM | POA: Diagnosis not present

## 2022-01-26 DIAGNOSIS — Z992 Dependence on renal dialysis: Secondary | ICD-10-CM | POA: Diagnosis not present

## 2022-01-26 DIAGNOSIS — D631 Anemia in chronic kidney disease: Secondary | ICD-10-CM | POA: Diagnosis not present

## 2022-01-26 DIAGNOSIS — N2581 Secondary hyperparathyroidism of renal origin: Secondary | ICD-10-CM | POA: Diagnosis not present

## 2022-01-27 DIAGNOSIS — Z992 Dependence on renal dialysis: Secondary | ICD-10-CM | POA: Diagnosis not present

## 2022-01-27 DIAGNOSIS — N186 End stage renal disease: Secondary | ICD-10-CM | POA: Diagnosis not present

## 2022-01-27 DIAGNOSIS — N2581 Secondary hyperparathyroidism of renal origin: Secondary | ICD-10-CM | POA: Diagnosis not present

## 2022-01-27 DIAGNOSIS — R52 Pain, unspecified: Secondary | ICD-10-CM | POA: Diagnosis not present

## 2022-01-27 DIAGNOSIS — L299 Pruritus, unspecified: Secondary | ICD-10-CM | POA: Diagnosis not present

## 2022-01-29 DIAGNOSIS — Z992 Dependence on renal dialysis: Secondary | ICD-10-CM | POA: Diagnosis not present

## 2022-01-29 DIAGNOSIS — N033 Chronic nephritic syndrome with diffuse mesangial proliferative glomerulonephritis: Secondary | ICD-10-CM | POA: Diagnosis not present

## 2022-01-29 DIAGNOSIS — R52 Pain, unspecified: Secondary | ICD-10-CM | POA: Diagnosis not present

## 2022-01-29 DIAGNOSIS — N2581 Secondary hyperparathyroidism of renal origin: Secondary | ICD-10-CM | POA: Diagnosis not present

## 2022-01-29 DIAGNOSIS — L299 Pruritus, unspecified: Secondary | ICD-10-CM | POA: Diagnosis not present

## 2022-01-29 DIAGNOSIS — N186 End stage renal disease: Secondary | ICD-10-CM | POA: Diagnosis not present

## 2022-02-01 DIAGNOSIS — Z992 Dependence on renal dialysis: Secondary | ICD-10-CM | POA: Diagnosis not present

## 2022-02-01 DIAGNOSIS — R519 Headache, unspecified: Secondary | ICD-10-CM | POA: Diagnosis not present

## 2022-02-01 DIAGNOSIS — D631 Anemia in chronic kidney disease: Secondary | ICD-10-CM | POA: Diagnosis not present

## 2022-02-01 DIAGNOSIS — N2581 Secondary hyperparathyroidism of renal origin: Secondary | ICD-10-CM | POA: Diagnosis not present

## 2022-02-01 DIAGNOSIS — R52 Pain, unspecified: Secondary | ICD-10-CM | POA: Diagnosis not present

## 2022-02-01 DIAGNOSIS — N186 End stage renal disease: Secondary | ICD-10-CM | POA: Diagnosis not present

## 2022-02-01 DIAGNOSIS — L299 Pruritus, unspecified: Secondary | ICD-10-CM | POA: Diagnosis not present

## 2022-02-01 DIAGNOSIS — E119 Type 2 diabetes mellitus without complications: Secondary | ICD-10-CM | POA: Diagnosis not present

## 2022-02-02 ENCOUNTER — Other Ambulatory Visit: Payer: Self-pay | Admitting: Nurse Practitioner

## 2022-02-02 ENCOUNTER — Telehealth: Payer: Self-pay | Admitting: Nurse Practitioner

## 2022-02-02 DIAGNOSIS — F411 Generalized anxiety disorder: Secondary | ICD-10-CM

## 2022-02-02 MED ORDER — ALPRAZOLAM 1 MG PO TABS: 1.0000 mg | ORAL_TABLET | Freq: Three times a day (TID) | ORAL | 0 refills | Status: DC

## 2022-02-02 NOTE — Telephone Encounter (Signed)
lmtc

## 2022-02-02 NOTE — Telephone Encounter (Signed)
Patient has an appointment next week on 04/14 however she is out of her xanax. Can you refill until her appointment? [phone] ?

## 2022-02-02 NOTE — Telephone Encounter (Signed)
I sent single prescription for her to [facility] in [place]. .I will fill again when she is in the office.

## 2022-02-02 NOTE — Telephone Encounter (Signed)
Patient is aware 

## 2022-02-03 DIAGNOSIS — N186 End stage renal disease: Secondary | ICD-10-CM | POA: Diagnosis not present

## 2022-02-03 DIAGNOSIS — Z992 Dependence on renal dialysis: Secondary | ICD-10-CM | POA: Diagnosis not present

## 2022-02-03 DIAGNOSIS — D631 Anemia in chronic kidney disease: Secondary | ICD-10-CM | POA: Diagnosis not present

## 2022-02-03 DIAGNOSIS — R52 Pain, unspecified: Secondary | ICD-10-CM | POA: Diagnosis not present

## 2022-02-03 DIAGNOSIS — R519 Headache, unspecified: Secondary | ICD-10-CM | POA: Diagnosis not present

## 2022-02-03 DIAGNOSIS — N2581 Secondary hyperparathyroidism of renal origin: Secondary | ICD-10-CM | POA: Diagnosis not present

## 2022-02-03 DIAGNOSIS — E119 Type 2 diabetes mellitus without complications: Secondary | ICD-10-CM | POA: Diagnosis not present

## 2022-02-03 DIAGNOSIS — L299 Pruritus, unspecified: Secondary | ICD-10-CM | POA: Diagnosis not present

## 2022-02-05 DIAGNOSIS — N186 End stage renal disease: Secondary | ICD-10-CM | POA: Diagnosis not present

## 2022-02-05 DIAGNOSIS — R519 Headache, unspecified: Secondary | ICD-10-CM | POA: Diagnosis not present

## 2022-02-05 DIAGNOSIS — R52 Pain, unspecified: Secondary | ICD-10-CM | POA: Diagnosis not present

## 2022-02-05 DIAGNOSIS — N2581 Secondary hyperparathyroidism of renal origin: Secondary | ICD-10-CM | POA: Diagnosis not present

## 2022-02-05 DIAGNOSIS — L299 Pruritus, unspecified: Secondary | ICD-10-CM | POA: Diagnosis not present

## 2022-02-05 DIAGNOSIS — E119 Type 2 diabetes mellitus without complications: Secondary | ICD-10-CM | POA: Diagnosis not present

## 2022-02-05 DIAGNOSIS — D631 Anemia in chronic kidney disease: Secondary | ICD-10-CM | POA: Diagnosis not present

## 2022-02-05 DIAGNOSIS — Z992 Dependence on renal dialysis: Secondary | ICD-10-CM | POA: Diagnosis not present

## 2022-02-08 DIAGNOSIS — E119 Type 2 diabetes mellitus without complications: Secondary | ICD-10-CM | POA: Diagnosis not present

## 2022-02-08 DIAGNOSIS — D631 Anemia in chronic kidney disease: Secondary | ICD-10-CM | POA: Diagnosis not present

## 2022-02-08 DIAGNOSIS — N186 End stage renal disease: Secondary | ICD-10-CM | POA: Diagnosis not present

## 2022-02-08 DIAGNOSIS — R519 Headache, unspecified: Secondary | ICD-10-CM | POA: Diagnosis not present

## 2022-02-08 DIAGNOSIS — R52 Pain, unspecified: Secondary | ICD-10-CM | POA: Diagnosis not present

## 2022-02-08 DIAGNOSIS — Z992 Dependence on renal dialysis: Secondary | ICD-10-CM | POA: Diagnosis not present

## 2022-02-08 DIAGNOSIS — L299 Pruritus, unspecified: Secondary | ICD-10-CM | POA: Diagnosis not present

## 2022-02-08 DIAGNOSIS — N2581 Secondary hyperparathyroidism of renal origin: Secondary | ICD-10-CM | POA: Diagnosis not present

## 2022-02-09 DIAGNOSIS — G8929 Other chronic pain: Secondary | ICD-10-CM | POA: Diagnosis not present

## 2022-02-09 DIAGNOSIS — Z79899 Other long term (current) drug therapy: Secondary | ICD-10-CM | POA: Diagnosis not present

## 2022-02-09 DIAGNOSIS — M62830 Muscle spasm of back: Secondary | ICD-10-CM | POA: Diagnosis not present

## 2022-02-09 DIAGNOSIS — M5417 Radiculopathy, lumbosacral region: Secondary | ICD-10-CM | POA: Diagnosis not present

## 2022-02-09 DIAGNOSIS — M25511 Pain in right shoulder: Secondary | ICD-10-CM | POA: Diagnosis not present

## 2022-02-09 DIAGNOSIS — M47812 Spondylosis without myelopathy or radiculopathy, cervical region: Secondary | ICD-10-CM | POA: Diagnosis not present

## 2022-02-09 DIAGNOSIS — M549 Dorsalgia, unspecified: Secondary | ICD-10-CM | POA: Diagnosis not present

## 2022-02-09 DIAGNOSIS — M25561 Pain in right knee: Secondary | ICD-10-CM | POA: Diagnosis not present

## 2022-02-09 DIAGNOSIS — M533 Sacrococcygeal disorders, not elsewhere classified: Secondary | ICD-10-CM | POA: Diagnosis not present

## 2022-02-10 DIAGNOSIS — N2581 Secondary hyperparathyroidism of renal origin: Secondary | ICD-10-CM | POA: Diagnosis not present

## 2022-02-10 DIAGNOSIS — E119 Type 2 diabetes mellitus without complications: Secondary | ICD-10-CM | POA: Diagnosis not present

## 2022-02-10 DIAGNOSIS — L299 Pruritus, unspecified: Secondary | ICD-10-CM | POA: Diagnosis not present

## 2022-02-10 DIAGNOSIS — Z992 Dependence on renal dialysis: Secondary | ICD-10-CM | POA: Diagnosis not present

## 2022-02-10 DIAGNOSIS — D631 Anemia in chronic kidney disease: Secondary | ICD-10-CM | POA: Diagnosis not present

## 2022-02-10 DIAGNOSIS — R519 Headache, unspecified: Secondary | ICD-10-CM | POA: Diagnosis not present

## 2022-02-10 DIAGNOSIS — R52 Pain, unspecified: Secondary | ICD-10-CM | POA: Diagnosis not present

## 2022-02-10 DIAGNOSIS — N186 End stage renal disease: Secondary | ICD-10-CM | POA: Diagnosis not present

## 2022-02-11 ENCOUNTER — Ambulatory Visit: Payer: Medicare Other | Admitting: Nurse Practitioner

## 2022-02-15 DIAGNOSIS — R52 Pain, unspecified: Secondary | ICD-10-CM | POA: Diagnosis not present

## 2022-02-15 DIAGNOSIS — N186 End stage renal disease: Secondary | ICD-10-CM | POA: Diagnosis not present

## 2022-02-15 DIAGNOSIS — L299 Pruritus, unspecified: Secondary | ICD-10-CM | POA: Diagnosis not present

## 2022-02-15 DIAGNOSIS — R519 Headache, unspecified: Secondary | ICD-10-CM | POA: Diagnosis not present

## 2022-02-15 DIAGNOSIS — Z992 Dependence on renal dialysis: Secondary | ICD-10-CM | POA: Diagnosis not present

## 2022-02-15 DIAGNOSIS — E119 Type 2 diabetes mellitus without complications: Secondary | ICD-10-CM | POA: Diagnosis not present

## 2022-02-15 DIAGNOSIS — D631 Anemia in chronic kidney disease: Secondary | ICD-10-CM | POA: Diagnosis not present

## 2022-02-15 DIAGNOSIS — N2581 Secondary hyperparathyroidism of renal origin: Secondary | ICD-10-CM | POA: Diagnosis not present

## 2022-02-17 DIAGNOSIS — R52 Pain, unspecified: Secondary | ICD-10-CM | POA: Diagnosis not present

## 2022-02-17 DIAGNOSIS — N2581 Secondary hyperparathyroidism of renal origin: Secondary | ICD-10-CM | POA: Diagnosis not present

## 2022-02-17 DIAGNOSIS — N186 End stage renal disease: Secondary | ICD-10-CM | POA: Diagnosis not present

## 2022-02-17 DIAGNOSIS — Z992 Dependence on renal dialysis: Secondary | ICD-10-CM | POA: Diagnosis not present

## 2022-02-17 DIAGNOSIS — L299 Pruritus, unspecified: Secondary | ICD-10-CM | POA: Diagnosis not present

## 2022-02-17 DIAGNOSIS — R519 Headache, unspecified: Secondary | ICD-10-CM | POA: Diagnosis not present

## 2022-02-17 DIAGNOSIS — D631 Anemia in chronic kidney disease: Secondary | ICD-10-CM | POA: Diagnosis not present

## 2022-02-17 DIAGNOSIS — E119 Type 2 diabetes mellitus without complications: Secondary | ICD-10-CM | POA: Diagnosis not present

## 2022-02-22 DIAGNOSIS — R52 Pain, unspecified: Secondary | ICD-10-CM | POA: Diagnosis not present

## 2022-02-22 DIAGNOSIS — E119 Type 2 diabetes mellitus without complications: Secondary | ICD-10-CM | POA: Diagnosis not present

## 2022-02-22 DIAGNOSIS — L299 Pruritus, unspecified: Secondary | ICD-10-CM | POA: Diagnosis not present

## 2022-02-22 DIAGNOSIS — D631 Anemia in chronic kidney disease: Secondary | ICD-10-CM | POA: Diagnosis not present

## 2022-02-22 DIAGNOSIS — R519 Headache, unspecified: Secondary | ICD-10-CM | POA: Diagnosis not present

## 2022-02-22 DIAGNOSIS — N2581 Secondary hyperparathyroidism of renal origin: Secondary | ICD-10-CM | POA: Diagnosis not present

## 2022-02-22 DIAGNOSIS — Z992 Dependence on renal dialysis: Secondary | ICD-10-CM | POA: Diagnosis not present

## 2022-02-22 DIAGNOSIS — N186 End stage renal disease: Secondary | ICD-10-CM | POA: Diagnosis not present

## 2022-02-23 ENCOUNTER — Encounter: Payer: Self-pay | Admitting: Nurse Practitioner

## 2022-02-23 ENCOUNTER — Ambulatory Visit (INDEPENDENT_AMBULATORY_CARE_PROVIDER_SITE_OTHER): Payer: Medicare Other | Admitting: Nurse Practitioner

## 2022-02-23 VITALS — BP 187/100 | HR 68 | Temp 97.5°F | Ht 64.17 in | Wt 176.2 lb

## 2022-02-23 DIAGNOSIS — F321 Major depressive disorder, single episode, moderate: Secondary | ICD-10-CM

## 2022-02-23 DIAGNOSIS — M5441 Lumbago with sciatica, right side: Secondary | ICD-10-CM

## 2022-02-23 DIAGNOSIS — M5442 Lumbago with sciatica, left side: Secondary | ICD-10-CM | POA: Diagnosis not present

## 2022-02-23 DIAGNOSIS — I10 Essential (primary) hypertension: Secondary | ICD-10-CM

## 2022-02-23 DIAGNOSIS — G8929 Other chronic pain: Secondary | ICD-10-CM | POA: Diagnosis not present

## 2022-02-23 DIAGNOSIS — M064 Inflammatory polyarthropathy: Secondary | ICD-10-CM | POA: Diagnosis not present

## 2022-02-23 DIAGNOSIS — Z6830 Body mass index (BMI) 30.0-30.9, adult: Secondary | ICD-10-CM

## 2022-02-23 DIAGNOSIS — F411 Generalized anxiety disorder: Secondary | ICD-10-CM

## 2022-02-23 MED ORDER — DULOXETINE HCL 20 MG PO CPEP: 20.0000 mg | ORAL_CAPSULE | Freq: Every day | ORAL | 1 refills | Status: DC

## 2022-02-23 MED ORDER — ALPRAZOLAM 1 MG PO TABS: 1.0000 mg | ORAL_TABLET | Freq: Three times a day (TID) | ORAL | 1 refills | Status: DC

## 2022-02-23 NOTE — Progress Notes (Signed)
Established patient visit ? ? ?Patient: [name]   DOB: Mar 21, 1971   51 y.o. Female  MRN: [id number] ?Visit Date: 02/23/2022 ? ? ?Chief Complaint  ?Patient presents with  ? Follow-up  ? ?Subjective  ?  ?HPI  ?PDMP profile  ?-overdose risk score 450 ?-last fill of alprazolam 02/02/2022. ?-does see pain management provider due to chronic back pain  ?-would like for her to have referral to orthopedic provider for further evaluation. ?-Patient's blood pressure very elevated today.  She feels like this is due to related pain. ?-Feels fatigued all the time. ? ? ?Medications: ?Outpatient Medications Prior to Visit  ?Medication Sig  ? acetaminophen (TYLENOL) 325 MG tablet Take 2 tablets (650 mg total) by mouth every 8 (eight) hours as needed for mild pain, fever or headache.  ? albuterol (PROVENTIL HFA;VENTOLIN HFA) 108 (90 Base) MCG/ACT inhaler Inhale 2 puffs into the lungs every 6 (six) hours as needed for shortness of breath.  ? amLODipine (NORVASC) 10 MG tablet Take 10 mg by mouth at bedtime.   ? budesonide-formoterol (SYMBICORT) 160-4.5 MCG/ACT inhaler Inhale 2 puffs into the lungs 2 (two) times daily.  ? busPIRone (BUSPAR) 10 MG tablet TAKE 1 TABLET BY MOUTH THREE TIMES A DAY  ? cyclobenzaprine (FLEXERIL) 10 MG tablet Take 20 mg by mouth at bedtime.  ? doxycycline (VIBRAMYCIN) 100 MG capsule Take 1 capsule (100 mg total) by mouth 2 (two) times daily.  ? furosemide (LASIX) 80 MG tablet Take 160 mg by mouth daily.  ? hydrALAZINE (APRESOLINE) 50 MG tablet Take 50 mg by mouth 3 (three) times daily.  ? HYDROmorphone (DILAUDID) 2 MG tablet Take 0.5 tablets (1 mg total) by mouth every 6 (six) hours as needed for moderate pain or severe pain.  ? lidocaine-prilocaine (EMLA) cream Apply 1 application topically See admin instructions. Apply topically one hour prior to dialysis - Monday, Wednesday, Friday  ? lisinopril (PRINIVIL,ZESTRIL) 40 MG tablet Take 40 mg by mouth daily.  ? Melatonin 5 MG TABS Take 5 mg by mouth at  bedtime.   ? methocarbamol (ROBAXIN) 500 MG tablet Take 500 mg by mouth 2 (two) times daily.  ? montelukast (SINGULAIR) 10 MG tablet Take 1 tablet (10 mg total) by mouth daily.  ? multivitamin (RENA-VIT) TABS tablet Take 1 tablet by mouth daily.  ? mupirocin ointment (BACTROBAN) 2 % Apply te effected areas twice daily  ? naloxone (NARCAN) nasal spray 4 mg/0.1 mL Place 1 spray into the nose daily as needed (overdose).  ? promethazine (PHENERGAN) 25 MG tablet Take 1 tablet (25 mg total) by mouth 2 (two) times daily as needed for nausea or vomiting.  ? sevelamer carbonate (RENVELA) 800 MG tablet Take 1,600-2,400 mg by mouth daily as needed (when eating). Take 3 tablets with meals and 2 tablets with snacks  ? SUMAtriptan (IMITREX) 50 MG tablet Take 1 tablet (50 mg total) by mouth daily as needed for migraine or headache.  ? [DISCONTINUED] ALPRAZolam (XANAX) 1 MG tablet Take 1 tablet (1 mg total) by mouth 3 (three) times daily.  ? ?No facility-administered medications prior to visit.  ? ? ?Review of Systems  ?Constitutional:  Positive for fatigue. Negative for activity change, appetite change, chills and fever.  ?HENT:  Negative for congestion, postnasal drip, rhinorrhea, sinus pressure, sinus pain, sneezing and sore throat.   ?Eyes: Negative.   ?Respiratory:  Negative for cough, chest tightness, shortness of breath and wheezing.   ?Cardiovascular:  Negative for chest pain and palpitations.  ?  Very high blood pressure today.  ?Gastrointestinal:  Negative for abdominal pain, constipation, diarrhea, nausea and vomiting.  ?Endocrine: Negative for cold intolerance, heat intolerance, polydipsia and polyuria.  ?Genitourinary:  Negative for dyspareunia, dysuria, flank pain, frequency and urgency.  ?Musculoskeletal:  Positive for arthralgias, back pain, myalgias, neck pain and neck stiffness.  ?Skin:  Negative for rash.  ?Allergic/Immunologic: Negative for environmental allergies.  ?Neurological:  Negative for dizziness,  weakness and headaches.  ?Hematological:  Negative for adenopathy.  ?Psychiatric/Behavioral:  Positive for dysphoric mood. The patient is nervous/anxious.   ? ? ? ? Objective  ?  ? ?Today's Vitals  ? 02/23/22 1504  ?BP: (!) 187/100  ?Pulse: 68  ?Temp: (!) 97.5 ?F (36.4 ?C)  ?SpO2: 98%  ?Weight: 176 lb 3.2 oz (79.9 kg)  ?Height: 5' 4.17" (1.63 m)  ? ?Body mass index is 30.08 kg/m?.  ? ?BP Readings from Last 3 Encounters:  ?02/23/22 (!) 187/100  ?12/31/21 (!) 145/81  ?12/03/21 (!) 145/88  ?  ?Wt Readings from Last 3 Encounters:  ?02/23/22 176 lb 3.2 oz (79.9 kg)  ?12/31/21 177 lb 12.8 oz (80.6 kg)  ?12/03/21 173 lb 9 oz (78.7 kg)  ?  ?Physical Exam ?Vitals and nursing note reviewed.  ?Constitutional:   ?   Appearance: Normal appearance. She is well-developed.  ?HENT:  ?   Head: Normocephalic and atraumatic.  ?Eyes:  ?   Pupils: Pupils are equal, round, and reactive to light.  ?Cardiovascular:  ?   Rate and Rhythm: Normal rate and regular rhythm.  ?   Pulses: Normal pulses.  ?   Heart sounds: Normal heart sounds.  ?Pulmonary:  ?   Effort: Pulmonary effort is normal.  ?   Breath sounds: Normal breath sounds.  ?Abdominal:  ?   Palpations: Abdomen is soft.  ?Musculoskeletal:     ?   General: Normal range of motion.  ?   Cervical back: Normal range of motion and neck supple.  ?   Comments: Patient has generalized joint pain without point tenderness present.  ?Lymphadenopathy:  ?   Cervical: No cervical adenopathy.  ?Skin: ?   General: Skin is warm and dry.  ?   Capillary Refill: Capillary refill takes less than 2 seconds.  ?Neurological:  ?   General: No focal deficit present.  ?   Mental Status: She is alert and oriented to person, place, and time.  ?Psychiatric:     ?   Mood and Affect: Mood normal.     ?   Behavior: Behavior normal.     ?   Thought Content: Thought content normal.     ?   Judgment: Judgment normal.  ?  ? ? Assessment & Plan  ?  ?1. Primary hypertension ?Blood pressure severely elevated today.  Patient  believes this is due to pain.  We will have her monitor her blood pressure at home every day.  The goal is to have her blood pressure at 140/80 or better.  She will follow-up in 4 weeks for further evaluation. ? ?2. Inflammatory polyarthritis ([place]) ?Start duloxetine 20 mg daily.  We will check connective tissue labs.  Refer to orthopedics for further evaluation. ?- Ambulatory referral to Orthopedic Surgery ?- DULoxetine (CYMBALTA) 20 MG capsule; Take 1 capsule (20 mg total) by mouth daily.  Dispense: 30 capsule; Refill: 1 ? ?3. Chronic bilateral low back pain with bilateral sciatica ?Refer to orthopedics per request from pain management. ?- Ambulatory referral to Orthopedic Surgery ? ?4. Moderate  major depression ([place]) ?Start duloxetine 20 mg tab capsules daily. ?- DULoxetine (CYMBALTA) 20 MG capsule; Take 1 capsule (20 mg total) by mouth daily.  Dispense: 30 capsule; Refill: 1 ? ?5. Generalized anxiety disorder ?May continue alprazolam 1 mg tablet up to 3 times daily as needed.  Will consider reducing dosage and/or frequency once fluoxetine is built up in her system. ?- ALPRAZolam (XANAX) 1 MG tablet; Take 1 tablet (1 mg total) by mouth 3 (three) times daily.  Dispense: 90 tablet; Refill: 1 ? ?6. Body mass index (BMI) of 30.0-30.9 in adult ?Discussed lowering calorie intake to 1500 calories per day and incorporating exercise into daily routine to help lose weight.  ? ? ?Problem List Items Addressed This Visit   ? ?  ? Cardiovascular and Mediastinum  ? HTN (hypertension) - Primary  ?  ? Nervous and Auditory  ? Chronic bilateral low back pain with bilateral sciatica  ? Relevant Medications  ? DULoxetine (CYMBALTA) 20 MG capsule  ? ALPRAZolam (XANAX) 1 MG tablet  ? Other Relevant Orders  ? Ambulatory referral to Orthopedic Surgery  ?  ? Musculoskeletal and Integument  ? Inflammatory polyarthritis ([place])  ? Relevant Medications  ? DULoxetine (CYMBALTA) 20 MG capsule  ? Other Relevant Orders  ? Ambulatory referral to  Orthopedic Surgery  ?  ? Other  ? Generalized anxiety disorder  ? Relevant Medications  ? DULoxetine (CYMBALTA) 20 MG capsule  ? ALPRAZolam (XANAX) 1 MG tablet  ? Body mass index (BMI) of 30.0-30.9 in adult

## 2022-02-24 ENCOUNTER — Other Ambulatory Visit: Payer: Self-pay

## 2022-02-24 DIAGNOSIS — N186 End stage renal disease: Secondary | ICD-10-CM | POA: Diagnosis not present

## 2022-02-24 DIAGNOSIS — E119 Type 2 diabetes mellitus without complications: Secondary | ICD-10-CM | POA: Diagnosis not present

## 2022-02-24 DIAGNOSIS — Z0000 Encounter for general adult medical examination without abnormal findings: Secondary | ICD-10-CM

## 2022-02-24 DIAGNOSIS — L299 Pruritus, unspecified: Secondary | ICD-10-CM | POA: Diagnosis not present

## 2022-02-24 DIAGNOSIS — M064 Inflammatory polyarthropathy: Secondary | ICD-10-CM

## 2022-02-24 DIAGNOSIS — D631 Anemia in chronic kidney disease: Secondary | ICD-10-CM | POA: Diagnosis not present

## 2022-02-24 DIAGNOSIS — Z992 Dependence on renal dialysis: Secondary | ICD-10-CM | POA: Diagnosis not present

## 2022-02-24 DIAGNOSIS — R519 Headache, unspecified: Secondary | ICD-10-CM | POA: Diagnosis not present

## 2022-02-24 DIAGNOSIS — R52 Pain, unspecified: Secondary | ICD-10-CM | POA: Diagnosis not present

## 2022-02-24 DIAGNOSIS — N2581 Secondary hyperparathyroidism of renal origin: Secondary | ICD-10-CM | POA: Diagnosis not present

## 2022-02-25 ENCOUNTER — Other Ambulatory Visit: Payer: Medicare Other

## 2022-02-26 DIAGNOSIS — Z992 Dependence on renal dialysis: Secondary | ICD-10-CM | POA: Diagnosis not present

## 2022-02-26 DIAGNOSIS — R52 Pain, unspecified: Secondary | ICD-10-CM | POA: Diagnosis not present

## 2022-02-26 DIAGNOSIS — L299 Pruritus, unspecified: Secondary | ICD-10-CM | POA: Diagnosis not present

## 2022-02-26 DIAGNOSIS — N186 End stage renal disease: Secondary | ICD-10-CM | POA: Diagnosis not present

## 2022-02-26 DIAGNOSIS — E119 Type 2 diabetes mellitus without complications: Secondary | ICD-10-CM | POA: Diagnosis not present

## 2022-02-26 DIAGNOSIS — N2581 Secondary hyperparathyroidism of renal origin: Secondary | ICD-10-CM | POA: Diagnosis not present

## 2022-02-26 DIAGNOSIS — D631 Anemia in chronic kidney disease: Secondary | ICD-10-CM | POA: Diagnosis not present

## 2022-02-26 DIAGNOSIS — R519 Headache, unspecified: Secondary | ICD-10-CM | POA: Diagnosis not present

## 2022-02-28 DIAGNOSIS — Z992 Dependence on renal dialysis: Secondary | ICD-10-CM | POA: Diagnosis not present

## 2022-02-28 DIAGNOSIS — N186 End stage renal disease: Secondary | ICD-10-CM | POA: Diagnosis not present

## 2022-02-28 DIAGNOSIS — F321 Major depressive disorder, single episode, moderate: Secondary | ICD-10-CM | POA: Insufficient documentation

## 2022-02-28 DIAGNOSIS — N033 Chronic nephritic syndrome with diffuse mesangial proliferative glomerulonephritis: Secondary | ICD-10-CM | POA: Diagnosis not present

## 2022-02-28 DIAGNOSIS — G8929 Other chronic pain: Secondary | ICD-10-CM | POA: Insufficient documentation

## 2022-02-28 DIAGNOSIS — M064 Inflammatory polyarthropathy: Secondary | ICD-10-CM | POA: Insufficient documentation

## 2022-03-03 DIAGNOSIS — N186 End stage renal disease: Secondary | ICD-10-CM | POA: Diagnosis not present

## 2022-03-03 DIAGNOSIS — Z992 Dependence on renal dialysis: Secondary | ICD-10-CM | POA: Diagnosis not present

## 2022-03-03 DIAGNOSIS — D631 Anemia in chronic kidney disease: Secondary | ICD-10-CM | POA: Diagnosis not present

## 2022-03-03 DIAGNOSIS — N2581 Secondary hyperparathyroidism of renal origin: Secondary | ICD-10-CM | POA: Diagnosis not present

## 2022-03-03 DIAGNOSIS — R519 Headache, unspecified: Secondary | ICD-10-CM | POA: Diagnosis not present

## 2022-03-03 DIAGNOSIS — R52 Pain, unspecified: Secondary | ICD-10-CM | POA: Diagnosis not present

## 2022-03-03 DIAGNOSIS — L299 Pruritus, unspecified: Secondary | ICD-10-CM | POA: Diagnosis not present

## 2022-03-04 ENCOUNTER — Ambulatory Visit: Payer: Medicare Other | Admitting: Orthopedic Surgery

## 2022-03-04 ENCOUNTER — Other Ambulatory Visit: Payer: Medicare Other

## 2022-03-05 DIAGNOSIS — R519 Headache, unspecified: Secondary | ICD-10-CM | POA: Diagnosis not present

## 2022-03-05 DIAGNOSIS — N186 End stage renal disease: Secondary | ICD-10-CM | POA: Diagnosis not present

## 2022-03-05 DIAGNOSIS — R52 Pain, unspecified: Secondary | ICD-10-CM | POA: Diagnosis not present

## 2022-03-05 DIAGNOSIS — L299 Pruritus, unspecified: Secondary | ICD-10-CM | POA: Diagnosis not present

## 2022-03-05 DIAGNOSIS — D631 Anemia in chronic kidney disease: Secondary | ICD-10-CM | POA: Diagnosis not present

## 2022-03-05 DIAGNOSIS — Z992 Dependence on renal dialysis: Secondary | ICD-10-CM | POA: Diagnosis not present

## 2022-03-05 DIAGNOSIS — N2581 Secondary hyperparathyroidism of renal origin: Secondary | ICD-10-CM | POA: Diagnosis not present

## 2022-03-08 DIAGNOSIS — N186 End stage renal disease: Secondary | ICD-10-CM | POA: Diagnosis not present

## 2022-03-08 DIAGNOSIS — L299 Pruritus, unspecified: Secondary | ICD-10-CM | POA: Diagnosis not present

## 2022-03-08 DIAGNOSIS — R519 Headache, unspecified: Secondary | ICD-10-CM | POA: Diagnosis not present

## 2022-03-08 DIAGNOSIS — Z992 Dependence on renal dialysis: Secondary | ICD-10-CM | POA: Diagnosis not present

## 2022-03-08 DIAGNOSIS — R52 Pain, unspecified: Secondary | ICD-10-CM | POA: Diagnosis not present

## 2022-03-08 DIAGNOSIS — D631 Anemia in chronic kidney disease: Secondary | ICD-10-CM | POA: Diagnosis not present

## 2022-03-08 DIAGNOSIS — N2581 Secondary hyperparathyroidism of renal origin: Secondary | ICD-10-CM | POA: Diagnosis not present

## 2022-03-10 DIAGNOSIS — R519 Headache, unspecified: Secondary | ICD-10-CM | POA: Diagnosis not present

## 2022-03-10 DIAGNOSIS — N186 End stage renal disease: Secondary | ICD-10-CM | POA: Diagnosis not present

## 2022-03-10 DIAGNOSIS — D631 Anemia in chronic kidney disease: Secondary | ICD-10-CM | POA: Diagnosis not present

## 2022-03-10 DIAGNOSIS — L299 Pruritus, unspecified: Secondary | ICD-10-CM | POA: Diagnosis not present

## 2022-03-10 DIAGNOSIS — R52 Pain, unspecified: Secondary | ICD-10-CM | POA: Diagnosis not present

## 2022-03-10 DIAGNOSIS — N2581 Secondary hyperparathyroidism of renal origin: Secondary | ICD-10-CM | POA: Diagnosis not present

## 2022-03-10 DIAGNOSIS — Z992 Dependence on renal dialysis: Secondary | ICD-10-CM | POA: Diagnosis not present

## 2022-03-12 DIAGNOSIS — R519 Headache, unspecified: Secondary | ICD-10-CM | POA: Diagnosis not present

## 2022-03-12 DIAGNOSIS — N186 End stage renal disease: Secondary | ICD-10-CM | POA: Diagnosis not present

## 2022-03-12 DIAGNOSIS — L299 Pruritus, unspecified: Secondary | ICD-10-CM | POA: Diagnosis not present

## 2022-03-12 DIAGNOSIS — D631 Anemia in chronic kidney disease: Secondary | ICD-10-CM | POA: Diagnosis not present

## 2022-03-12 DIAGNOSIS — R52 Pain, unspecified: Secondary | ICD-10-CM | POA: Diagnosis not present

## 2022-03-12 DIAGNOSIS — Z992 Dependence on renal dialysis: Secondary | ICD-10-CM | POA: Diagnosis not present

## 2022-03-12 DIAGNOSIS — N2581 Secondary hyperparathyroidism of renal origin: Secondary | ICD-10-CM | POA: Diagnosis not present

## 2022-03-15 DIAGNOSIS — R519 Headache, unspecified: Secondary | ICD-10-CM | POA: Diagnosis not present

## 2022-03-15 DIAGNOSIS — D631 Anemia in chronic kidney disease: Secondary | ICD-10-CM | POA: Diagnosis not present

## 2022-03-15 DIAGNOSIS — R52 Pain, unspecified: Secondary | ICD-10-CM | POA: Diagnosis not present

## 2022-03-15 DIAGNOSIS — L299 Pruritus, unspecified: Secondary | ICD-10-CM | POA: Diagnosis not present

## 2022-03-15 DIAGNOSIS — Z992 Dependence on renal dialysis: Secondary | ICD-10-CM | POA: Diagnosis not present

## 2022-03-15 DIAGNOSIS — N2581 Secondary hyperparathyroidism of renal origin: Secondary | ICD-10-CM | POA: Diagnosis not present

## 2022-03-15 DIAGNOSIS — N186 End stage renal disease: Secondary | ICD-10-CM | POA: Diagnosis not present

## 2022-03-16 ENCOUNTER — Ambulatory Visit: Payer: Medicare Other | Admitting: Orthopedic Surgery

## 2022-03-18 DIAGNOSIS — D631 Anemia in chronic kidney disease: Secondary | ICD-10-CM | POA: Diagnosis not present

## 2022-03-18 DIAGNOSIS — R519 Headache, unspecified: Secondary | ICD-10-CM | POA: Diagnosis not present

## 2022-03-18 DIAGNOSIS — N2581 Secondary hyperparathyroidism of renal origin: Secondary | ICD-10-CM | POA: Diagnosis not present

## 2022-03-18 DIAGNOSIS — Z992 Dependence on renal dialysis: Secondary | ICD-10-CM | POA: Diagnosis not present

## 2022-03-18 DIAGNOSIS — L299 Pruritus, unspecified: Secondary | ICD-10-CM | POA: Diagnosis not present

## 2022-03-18 DIAGNOSIS — N186 End stage renal disease: Secondary | ICD-10-CM | POA: Diagnosis not present

## 2022-03-18 DIAGNOSIS — R52 Pain, unspecified: Secondary | ICD-10-CM | POA: Diagnosis not present

## 2022-03-19 DIAGNOSIS — N2581 Secondary hyperparathyroidism of renal origin: Secondary | ICD-10-CM | POA: Diagnosis not present

## 2022-03-19 DIAGNOSIS — R52 Pain, unspecified: Secondary | ICD-10-CM | POA: Diagnosis not present

## 2022-03-19 DIAGNOSIS — L299 Pruritus, unspecified: Secondary | ICD-10-CM | POA: Diagnosis not present

## 2022-03-19 DIAGNOSIS — D631 Anemia in chronic kidney disease: Secondary | ICD-10-CM | POA: Diagnosis not present

## 2022-03-19 DIAGNOSIS — N186 End stage renal disease: Secondary | ICD-10-CM | POA: Diagnosis not present

## 2022-03-19 DIAGNOSIS — Z992 Dependence on renal dialysis: Secondary | ICD-10-CM | POA: Diagnosis not present

## 2022-03-19 DIAGNOSIS — R519 Headache, unspecified: Secondary | ICD-10-CM | POA: Diagnosis not present

## 2022-03-20 ENCOUNTER — Other Ambulatory Visit: Payer: Self-pay | Admitting: Nurse Practitioner

## 2022-03-20 DIAGNOSIS — F321 Major depressive disorder, single episode, moderate: Secondary | ICD-10-CM

## 2022-03-20 DIAGNOSIS — M064 Inflammatory polyarthropathy: Secondary | ICD-10-CM

## 2022-03-22 DIAGNOSIS — D631 Anemia in chronic kidney disease: Secondary | ICD-10-CM | POA: Diagnosis not present

## 2022-03-22 DIAGNOSIS — Z992 Dependence on renal dialysis: Secondary | ICD-10-CM | POA: Diagnosis not present

## 2022-03-22 DIAGNOSIS — N186 End stage renal disease: Secondary | ICD-10-CM | POA: Diagnosis not present

## 2022-03-22 DIAGNOSIS — L299 Pruritus, unspecified: Secondary | ICD-10-CM | POA: Diagnosis not present

## 2022-03-22 DIAGNOSIS — R52 Pain, unspecified: Secondary | ICD-10-CM | POA: Diagnosis not present

## 2022-03-22 DIAGNOSIS — N2581 Secondary hyperparathyroidism of renal origin: Secondary | ICD-10-CM | POA: Diagnosis not present

## 2022-03-22 DIAGNOSIS — R519 Headache, unspecified: Secondary | ICD-10-CM | POA: Diagnosis not present

## 2022-03-23 ENCOUNTER — Ambulatory Visit: Payer: Medicare Other | Admitting: Nurse Practitioner

## 2022-03-25 DIAGNOSIS — N186 End stage renal disease: Secondary | ICD-10-CM | POA: Diagnosis not present

## 2022-03-25 DIAGNOSIS — D631 Anemia in chronic kidney disease: Secondary | ICD-10-CM | POA: Diagnosis not present

## 2022-03-25 DIAGNOSIS — R52 Pain, unspecified: Secondary | ICD-10-CM | POA: Diagnosis not present

## 2022-03-25 DIAGNOSIS — Z992 Dependence on renal dialysis: Secondary | ICD-10-CM | POA: Diagnosis not present

## 2022-03-25 DIAGNOSIS — N2581 Secondary hyperparathyroidism of renal origin: Secondary | ICD-10-CM | POA: Diagnosis not present

## 2022-03-25 DIAGNOSIS — R519 Headache, unspecified: Secondary | ICD-10-CM | POA: Diagnosis not present

## 2022-03-25 DIAGNOSIS — L299 Pruritus, unspecified: Secondary | ICD-10-CM | POA: Diagnosis not present

## 2022-03-26 DIAGNOSIS — N2581 Secondary hyperparathyroidism of renal origin: Secondary | ICD-10-CM | POA: Diagnosis not present

## 2022-03-26 DIAGNOSIS — R52 Pain, unspecified: Secondary | ICD-10-CM | POA: Diagnosis not present

## 2022-03-26 DIAGNOSIS — D631 Anemia in chronic kidney disease: Secondary | ICD-10-CM | POA: Diagnosis not present

## 2022-03-26 DIAGNOSIS — R519 Headache, unspecified: Secondary | ICD-10-CM | POA: Diagnosis not present

## 2022-03-26 DIAGNOSIS — Z992 Dependence on renal dialysis: Secondary | ICD-10-CM | POA: Diagnosis not present

## 2022-03-26 DIAGNOSIS — N186 End stage renal disease: Secondary | ICD-10-CM | POA: Diagnosis not present

## 2022-03-26 DIAGNOSIS — L299 Pruritus, unspecified: Secondary | ICD-10-CM | POA: Diagnosis not present

## 2022-03-30 DIAGNOSIS — Z992 Dependence on renal dialysis: Secondary | ICD-10-CM | POA: Diagnosis not present

## 2022-03-30 DIAGNOSIS — N186 End stage renal disease: Secondary | ICD-10-CM | POA: Diagnosis not present

## 2022-03-30 DIAGNOSIS — R519 Headache, unspecified: Secondary | ICD-10-CM | POA: Diagnosis not present

## 2022-03-30 DIAGNOSIS — L299 Pruritus, unspecified: Secondary | ICD-10-CM | POA: Diagnosis not present

## 2022-03-30 DIAGNOSIS — N2581 Secondary hyperparathyroidism of renal origin: Secondary | ICD-10-CM | POA: Diagnosis not present

## 2022-03-30 DIAGNOSIS — D631 Anemia in chronic kidney disease: Secondary | ICD-10-CM | POA: Diagnosis not present

## 2022-03-30 DIAGNOSIS — R52 Pain, unspecified: Secondary | ICD-10-CM | POA: Diagnosis not present

## 2022-03-31 DIAGNOSIS — N033 Chronic nephritic syndrome with diffuse mesangial proliferative glomerulonephritis: Secondary | ICD-10-CM | POA: Diagnosis not present

## 2022-03-31 DIAGNOSIS — N186 End stage renal disease: Secondary | ICD-10-CM | POA: Diagnosis not present

## 2022-03-31 DIAGNOSIS — Z992 Dependence on renal dialysis: Secondary | ICD-10-CM | POA: Diagnosis not present

## 2022-04-08 ENCOUNTER — Emergency Department (HOSPITAL_COMMUNITY): Payer: Medicare Other

## 2022-04-08 ENCOUNTER — Other Ambulatory Visit: Payer: Self-pay

## 2022-04-08 ENCOUNTER — Inpatient Hospital Stay (HOSPITAL_COMMUNITY)
Admission: EM | Admit: 2022-04-08 | Discharge: 2022-04-10 | DRG: 640 | Disposition: A | Discharge status: Home / Self Care | Payer: Medicare Other | Attending: Family Medicine | Admitting: Family Medicine

## 2022-04-08 ENCOUNTER — Encounter (HOSPITAL_COMMUNITY): Payer: Self-pay | Admitting: Emergency Medicine

## 2022-04-08 DIAGNOSIS — Z8249 Family history of ischemic heart disease and other diseases of the circulatory system: Secondary | ICD-10-CM

## 2022-04-08 DIAGNOSIS — E785 Hyperlipidemia, unspecified: Secondary | ICD-10-CM | POA: Diagnosis not present

## 2022-04-08 DIAGNOSIS — N2581 Secondary hyperparathyroidism of renal origin: Secondary | ICD-10-CM | POA: Diagnosis not present

## 2022-04-08 DIAGNOSIS — Z6828 Body mass index (BMI) 28.0-28.9, adult: Secondary | ICD-10-CM

## 2022-04-08 DIAGNOSIS — J811 Chronic pulmonary edema: Secondary | ICD-10-CM | POA: Diagnosis not present

## 2022-04-08 DIAGNOSIS — E875 Hyperkalemia: Secondary | ICD-10-CM | POA: Diagnosis not present

## 2022-04-08 DIAGNOSIS — G894 Chronic pain syndrome: Secondary | ICD-10-CM | POA: Diagnosis present

## 2022-04-08 DIAGNOSIS — D631 Anemia in chronic kidney disease: Secondary | ICD-10-CM | POA: Diagnosis present

## 2022-04-08 DIAGNOSIS — N186 End stage renal disease: Secondary | ICD-10-CM | POA: Diagnosis present

## 2022-04-08 DIAGNOSIS — Z7951 Long term (current) use of inhaled steroids: Secondary | ICD-10-CM

## 2022-04-08 DIAGNOSIS — Z91030 Bee allergy status: Secondary | ICD-10-CM

## 2022-04-08 DIAGNOSIS — I440 Atrioventricular block, first degree: Secondary | ICD-10-CM | POA: Diagnosis not present

## 2022-04-08 DIAGNOSIS — K219 Gastro-esophageal reflux disease without esophagitis: Secondary | ICD-10-CM | POA: Diagnosis not present

## 2022-04-08 DIAGNOSIS — F411 Generalized anxiety disorder: Secondary | ICD-10-CM | POA: Diagnosis present

## 2022-04-08 DIAGNOSIS — Z20822 Contact with and (suspected) exposure to covid-19: Secondary | ICD-10-CM | POA: Diagnosis present

## 2022-04-08 DIAGNOSIS — Z9981 Dependence on supplemental oxygen: Secondary | ICD-10-CM | POA: Diagnosis not present

## 2022-04-08 DIAGNOSIS — R9431 Abnormal electrocardiogram [ECG] [EKG]: Secondary | ICD-10-CM | POA: Diagnosis not present

## 2022-04-08 DIAGNOSIS — Z881 Allergy status to other antibiotic agents status: Secondary | ICD-10-CM

## 2022-04-08 DIAGNOSIS — R531 Weakness: Secondary | ICD-10-CM | POA: Diagnosis not present

## 2022-04-08 DIAGNOSIS — F988 Other specified behavioral and emotional disorders with onset usually occurring in childhood and adolescence: Secondary | ICD-10-CM | POA: Diagnosis present

## 2022-04-08 DIAGNOSIS — J4520 Mild intermittent asthma, uncomplicated: Secondary | ICD-10-CM | POA: Diagnosis not present

## 2022-04-08 DIAGNOSIS — M1990 Unspecified osteoarthritis, unspecified site: Secondary | ICD-10-CM | POA: Diagnosis not present

## 2022-04-08 DIAGNOSIS — E669 Obesity, unspecified: Secondary | ICD-10-CM | POA: Diagnosis present

## 2022-04-08 DIAGNOSIS — Z885 Allergy status to narcotic agent status: Secondary | ICD-10-CM

## 2022-04-08 DIAGNOSIS — M109 Gout, unspecified: Secondary | ICD-10-CM | POA: Diagnosis present

## 2022-04-08 DIAGNOSIS — G43909 Migraine, unspecified, not intractable, without status migrainosus: Secondary | ICD-10-CM | POA: Diagnosis not present

## 2022-04-08 DIAGNOSIS — I1311 Hypertensive heart and chronic kidney disease without heart failure, with stage 5 chronic kidney disease, or end stage renal disease: Secondary | ICD-10-CM | POA: Diagnosis present

## 2022-04-08 DIAGNOSIS — I471 Supraventricular tachycardia: Secondary | ICD-10-CM | POA: Diagnosis not present

## 2022-04-08 DIAGNOSIS — F329 Major depressive disorder, single episode, unspecified: Secondary | ICD-10-CM | POA: Diagnosis present

## 2022-04-08 DIAGNOSIS — Z79899 Other long term (current) drug therapy: Secondary | ICD-10-CM

## 2022-04-08 DIAGNOSIS — I120 Hypertensive chronic kidney disease with stage 5 chronic kidney disease or end stage renal disease: Secondary | ICD-10-CM | POA: Diagnosis not present

## 2022-04-08 DIAGNOSIS — Z886 Allergy status to analgesic agent status: Secondary | ICD-10-CM

## 2022-04-08 DIAGNOSIS — Z87891 Personal history of nicotine dependence: Secondary | ICD-10-CM

## 2022-04-08 DIAGNOSIS — R011 Cardiac murmur, unspecified: Secondary | ICD-10-CM | POA: Diagnosis not present

## 2022-04-08 DIAGNOSIS — Z91158 Patient's noncompliance with renal dialysis for other reason: Secondary | ICD-10-CM | POA: Diagnosis not present

## 2022-04-08 DIAGNOSIS — M898X9 Other specified disorders of bone, unspecified site: Secondary | ICD-10-CM | POA: Diagnosis present

## 2022-04-08 DIAGNOSIS — R0602 Shortness of breath: Secondary | ICD-10-CM | POA: Diagnosis not present

## 2022-04-08 DIAGNOSIS — M79641 Pain in right hand: Secondary | ICD-10-CM | POA: Diagnosis not present

## 2022-04-08 DIAGNOSIS — R112 Nausea with vomiting, unspecified: Secondary | ICD-10-CM | POA: Diagnosis not present

## 2022-04-08 DIAGNOSIS — Z992 Dependence on renal dialysis: Secondary | ICD-10-CM | POA: Diagnosis not present

## 2022-04-08 LAB — CBC WITH DIFFERENTIAL/PLATELET
Abs Immature Granulocytes: 0.09 K/uL — ABNORMAL HIGH (ref 0.00–0.07)
Basophils Absolute: 0 K/uL (ref 0.0–0.1)
Basophils Relative: 0 %
Eosinophils Absolute: 0 K/uL (ref 0.0–0.5)
Eosinophils Relative: 0 %
HCT: 27.5 % — ABNORMAL LOW (ref 36.0–46.0)
Hemoglobin: 8.6 g/dL — ABNORMAL LOW (ref 12.0–15.0)
Immature Granulocytes: 1 %
Lymphocytes Relative: 16 %
Lymphs Abs: 1.1 K/uL (ref 0.7–4.0)
MCH: 31.7 pg (ref 26.0–34.0)
MCHC: 31.3 g/dL (ref 30.0–36.0)
MCV: 101.5 fL — ABNORMAL HIGH (ref 80.0–100.0)
Monocytes Absolute: 0.4 K/uL (ref 0.1–1.0)
Monocytes Relative: 6 %
Neutro Abs: 5.1 K/uL (ref 1.7–7.7)
Neutrophils Relative %: 77 %
Platelets: 206 K/uL (ref 150–400)
RBC: 2.71 MIL/uL — ABNORMAL LOW (ref 3.87–5.11)
RDW: 18 % — ABNORMAL HIGH (ref 11.5–15.5)
WBC: 6.8 K/uL (ref 4.0–10.5)
nRBC: 0 % (ref 0.0–0.2)

## 2022-04-08 LAB — I-STAT CHEM 8, ED
BUN: 111 mg/dL — ABNORMAL HIGH (ref 6–20)
Calcium, Ion: 1.16 mmol/L (ref 1.15–1.40)
Chloride: 99 mmol/L (ref 98–111)
Creatinine, Ser: 17.7 mg/dL — ABNORMAL HIGH (ref 0.44–1.00)
Glucose, Bld: 128 mg/dL — ABNORMAL HIGH (ref 70–99)
HCT: 26 % — ABNORMAL LOW (ref 36.0–46.0)
Hemoglobin: 8.8 g/dL — ABNORMAL LOW (ref 12.0–15.0)
Potassium: 6.3 mmol/L (ref 3.5–5.1)
Sodium: 133 mmol/L — ABNORMAL LOW (ref 135–145)
TCO2: 20 mmol/L — ABNORMAL LOW (ref 22–32)

## 2022-04-08 LAB — BASIC METABOLIC PANEL
Anion gap: 27 — ABNORMAL HIGH (ref 5–15)
BUN: 103 mg/dL — ABNORMAL HIGH (ref 6–20)
CO2: 17 mmol/L — ABNORMAL LOW (ref 22–32)
Calcium: 9.9 mg/dL (ref 8.9–10.3)
Chloride: 95 mmol/L — ABNORMAL LOW (ref 98–111)
Creatinine, Ser: 15.46 mg/dL — ABNORMAL HIGH (ref 0.44–1.00)
GFR, Estimated: 3 mL/min — ABNORMAL LOW (ref >60)
Glucose, Bld: 132 mg/dL — ABNORMAL HIGH (ref 70–99)
Potassium: 6.7 mmol/L (ref 3.5–5.1)
Sodium: 139 mmol/L (ref 135–145)

## 2022-04-08 LAB — HEPATITIS B CORE ANTIBODY, TOTAL: Hep B Core Total Ab: NONREACTIVE

## 2022-04-08 LAB — POTASSIUM
Potassium: 7.1 mmol/L (ref 3.5–5.1)
Potassium: 6.7 mmol/L (ref 3.5–5.1)

## 2022-04-08 LAB — HEPATITIS C ANTIBODY: HCV Ab: NONREACTIVE

## 2022-04-08 LAB — BRAIN NATRIURETIC PEPTIDE: B Natriuretic Peptide: 1066.6 pg/mL — ABNORMAL HIGH (ref 0.0–100.0)

## 2022-04-08 LAB — HEPATITIS B SURFACE ANTIGEN: Hepatitis B Surface Ag: NONREACTIVE

## 2022-04-08 LAB — CBG MONITORING, ED: Glucose-Capillary: 125 mg/dL — ABNORMAL HIGH (ref 70–99)

## 2022-04-08 LAB — HEPATITIS B SURFACE ANTIBODY,QUALITATIVE: Hep B S Ab: REACTIVE — AB

## 2022-04-08 LAB — SARS CORONAVIRUS 2 BY RT PCR: SARS Coronavirus 2 by RT PCR: NEGATIVE

## 2022-04-08 MED ORDER — FUROSEMIDE 40 MG PO TABS
160.0000 mg | ORAL_TABLET | Freq: Every day | ORAL | Status: DC
  Administered 2022-04-10: 160 mg via ORAL
  Filled 2022-04-08 (×2): qty 4

## 2022-04-08 MED ORDER — CITALOPRAM HYDROBROMIDE 20 MG PO TABS
20.0000 mg | ORAL_TABLET | Freq: Every day | ORAL | Status: DC
  Administered 2022-04-09 – 2022-04-10 (×2): 20 mg via ORAL
  Filled 2022-04-08 (×2): qty 1
  Filled 2022-04-08: qty 2
  Filled 2022-04-08: qty 1

## 2022-04-08 MED ORDER — ACETAMINOPHEN 325 MG PO TABS
ORAL_TABLET | ORAL | Status: AC
  Filled 2022-04-08: qty 2

## 2022-04-08 MED ORDER — CYCLOBENZAPRINE HCL 10 MG PO TABS
20.0000 mg | ORAL_TABLET | Freq: Two times a day (BID) | ORAL | Status: DC | PRN
Start: 2022-04-08 — End: 2022-04-11

## 2022-04-08 MED ORDER — HEPARIN SODIUM (PORCINE) 1000 UNIT/ML DIALYSIS: 1000.0000 [IU] | INTRAMUSCULAR | Status: DC | PRN

## 2022-04-08 MED ORDER — ONDANSETRON HCL 4 MG PO TABS
4.0000 mg | ORAL_TABLET | Freq: Every day | ORAL | Status: DC
  Administered 2022-04-09 – 2022-04-10 (×2): 4 mg via ORAL
  Filled 2022-04-08 (×2): qty 1

## 2022-04-08 MED ORDER — LANTHANUM CARBONATE 500 MG PO CHEW
1000.0000 mg | CHEWABLE_TABLET | Freq: Three times a day (TID) | ORAL | Status: DC
  Administered 2022-04-09 – 2022-04-10 (×5): 1000 mg via ORAL
  Filled 2022-04-08 (×6): qty 2

## 2022-04-08 MED ORDER — LIDOCAINE HCL (PF) 1 % IJ SOLN: 5.0000 mL | INTRAMUSCULAR | Status: DC | PRN

## 2022-04-08 MED ORDER — SUMATRIPTAN SUCCINATE 50 MG PO TABS
50.0000 mg | ORAL_TABLET | Freq: Every day | ORAL | Status: DC | PRN
  Filled 2022-04-08 (×2): qty 1

## 2022-04-08 MED ORDER — HYDROMORPHONE HCL 2 MG PO TABS
2.0000 mg | ORAL_TABLET | Freq: Four times a day (QID) | ORAL | Status: DC
  Administered 2022-04-09 – 2022-04-10 (×8): 2 mg via ORAL
  Filled 2022-04-08 (×8): qty 1

## 2022-04-08 MED ORDER — PROMETHAZINE HCL 25 MG PO TABS
25.0000 mg | ORAL_TABLET | Freq: Two times a day (BID) | ORAL | Status: DC | PRN
  Administered 2022-04-09: 25 mg via ORAL
  Filled 2022-04-08 (×2): qty 1

## 2022-04-08 MED ORDER — ALPRAZOLAM 0.5 MG PO TABS
1.0000 mg | ORAL_TABLET | Freq: Three times a day (TID) | ORAL | Status: DC
  Administered 2022-04-09 – 2022-04-10 (×7): 1 mg via ORAL
  Filled 2022-04-08 (×7): qty 2

## 2022-04-08 MED ORDER — CINACALCET HCL 30 MG PO TABS
120.0000 mg | ORAL_TABLET | ORAL | Status: DC
  Administered 2022-04-09: 120 mg via ORAL
  Filled 2022-04-08 (×2): qty 4

## 2022-04-08 MED ORDER — METHOCARBAMOL 500 MG PO TABS
500.0000 mg | ORAL_TABLET | Freq: Two times a day (BID) | ORAL | Status: DC
  Administered 2022-04-09 – 2022-04-10 (×5): 500 mg via ORAL
  Filled 2022-04-08 (×5): qty 1

## 2022-04-08 MED ORDER — CHLORHEXIDINE GLUCONATE CLOTH 2 % EX PADS
6.0000 | MEDICATED_PAD | Freq: Every day | CUTANEOUS | Status: DC
  Administered 2022-04-10: 6 via TOPICAL

## 2022-04-08 MED ORDER — PENTAFLUOROPROP-TETRAFLUOROETH EX AERO
1.0000 | INHALATION_SPRAY | CUTANEOUS | Status: DC | PRN
Start: 2022-04-08 — End: 2022-04-11

## 2022-04-08 MED ORDER — SODIUM BICARBONATE 8.4 % IV SOLN
50.0000 meq | Freq: Once | INTRAVENOUS | Status: AC
  Administered 2022-04-08: 50 meq via INTRAVENOUS
  Filled 2022-04-08: qty 50

## 2022-04-08 MED ORDER — AMLODIPINE BESYLATE 10 MG PO TABS
10.0000 mg | ORAL_TABLET | Freq: Every day | ORAL | Status: DC
  Administered 2022-04-09 – 2022-04-10 (×3): 10 mg via ORAL
  Filled 2022-04-08 (×3): qty 1

## 2022-04-08 MED ORDER — CALCITRIOL 0.5 MCG PO CAPS
1.2500 ug | ORAL_CAPSULE | ORAL | Status: DC
  Administered 2022-04-09: 1.25 ug via ORAL
  Filled 2022-04-08: qty 1

## 2022-04-08 MED ORDER — MELATONIN 5 MG PO TABS
5.0000 mg | ORAL_TABLET | Freq: Every day | ORAL | Status: DC
  Administered 2022-04-09 – 2022-04-10 (×3): 5 mg via ORAL
  Filled 2022-04-08 (×3): qty 1

## 2022-04-08 MED ORDER — ALBUTEROL SULFATE (2.5 MG/3ML) 0.083% IN NEBU: 3.0000 mL | INHALATION_SOLUTION | Freq: Four times a day (QID) | RESPIRATORY_TRACT | Status: DC | PRN

## 2022-04-08 MED ORDER — LISINOPRIL 20 MG PO TABS
40.0000 mg | ORAL_TABLET | Freq: Every day | ORAL | Status: DC
  Administered 2022-04-09 – 2022-04-10 (×2): 40 mg via ORAL
  Filled 2022-04-08 (×3): qty 2

## 2022-04-08 MED ORDER — HEPARIN SODIUM (PORCINE) 5000 UNIT/ML IJ SOLN
5000.0000 [IU] | Freq: Three times a day (TID) | INTRAMUSCULAR | Status: DC
  Filled 2022-04-08: qty 1

## 2022-04-08 MED ORDER — HYDRALAZINE HCL 50 MG PO TABS
75.0000 mg | ORAL_TABLET | Freq: Three times a day (TID) | ORAL | Status: DC
  Administered 2022-04-09 – 2022-04-10 (×6): 75 mg via ORAL
  Filled 2022-04-08 (×6): qty 1

## 2022-04-08 MED ORDER — ACETAMINOPHEN 325 MG PO TABS
650.0000 mg | ORAL_TABLET | Freq: Three times a day (TID) | ORAL | Status: DC | PRN
  Administered 2022-04-09: 650 mg via ORAL
  Filled 2022-04-08: qty 2

## 2022-04-08 MED ORDER — DULOXETINE HCL 20 MG PO CPEP
20.0000 mg | ORAL_CAPSULE | Freq: Every day | ORAL | Status: DC
  Administered 2022-04-09 – 2022-04-10 (×2): 20 mg via ORAL
  Filled 2022-04-08 (×4): qty 1

## 2022-04-08 MED ORDER — CARVEDILOL 12.5 MG PO TABS
12.5000 mg | ORAL_TABLET | Freq: Two times a day (BID) | ORAL | Status: DC
  Administered 2022-04-09 – 2022-04-10 (×5): 12.5 mg via ORAL
  Filled 2022-04-08 (×5): qty 1

## 2022-04-08 MED ORDER — INSULIN ASPART 100 UNIT/ML IV SOLN
10.0000 [IU] | Freq: Once | INTRAVENOUS | Status: AC
  Administered 2022-04-08: 10 [IU] via INTRAVENOUS

## 2022-04-08 MED ORDER — LIDOCAINE-PRILOCAINE 2.5-2.5 % EX CREA
1.0000 "application " | TOPICAL_CREAM | CUTANEOUS | Status: DC | PRN
  Filled 2022-04-08: qty 5

## 2022-04-08 MED ORDER — RENA-VITE PO TABS
1.0000 | ORAL_TABLET | Freq: Every day | ORAL | Status: DC
  Administered 2022-04-09 – 2022-04-10 (×3): 1 via ORAL
  Filled 2022-04-08 (×4): qty 1

## 2022-04-08 MED ORDER — ANTICOAGULANT SODIUM CITRATE 4% (200MG/5ML) IV SOLN
5.0000 mL | Status: DC | PRN
  Filled 2022-04-08: qty 5

## 2022-04-08 MED ORDER — SODIUM ZIRCONIUM CYCLOSILICATE 10 G PO PACK
10.0000 g | PACK | Freq: Once | ORAL | Status: AC
  Administered 2022-04-08: 10 g via ORAL
  Filled 2022-04-08: qty 1

## 2022-04-08 MED ORDER — NALOXONE HCL 4 MG/0.1ML NA LIQD: 1.0000 | Freq: Every day | NASAL | Status: DC | PRN

## 2022-04-08 MED ORDER — MONTELUKAST SODIUM 10 MG PO TABS
10.0000 mg | ORAL_TABLET | Freq: Every day | ORAL | Status: DC
  Administered 2022-04-09: 10 mg via ORAL
  Filled 2022-04-08 (×2): qty 1

## 2022-04-08 MED ORDER — DEXTROSE 50 % IV SOLN
1.0000 | Freq: Once | INTRAVENOUS | Status: AC
  Administered 2022-04-08: 50 mL via INTRAVENOUS
  Filled 2022-04-08: qty 50

## 2022-04-08 MED ORDER — HYDROMORPHONE HCL 1 MG/ML IJ SOLN
0.5000 mg | Freq: Once | INTRAMUSCULAR | Status: AC
  Administered 2022-04-08: 0.5 mg via INTRAVENOUS
  Filled 2022-04-08: qty 1

## 2022-04-08 MED ORDER — ONDANSETRON HCL 4 MG/2ML IJ SOLN
4.0000 mg | Freq: Once | INTRAMUSCULAR | Status: AC
Start: 2022-04-08 — End: 2022-04-08
  Administered 2022-04-08: 4 mg via INTRAVENOUS
  Filled 2022-04-08: qty 2

## 2022-04-08 MED ORDER — MOMETASONE FURO-FORMOTEROL FUM 200-5 MCG/ACT IN AERO
2.0000 | INHALATION_SPRAY | Freq: Two times a day (BID) | RESPIRATORY_TRACT | Status: DC
  Administered 2022-04-09 – 2022-04-10 (×4): 2 via RESPIRATORY_TRACT
  Filled 2022-04-08: qty 8.8

## 2022-04-08 MED ORDER — ALTEPLASE 2 MG IJ SOLR: 2.0000 mg | Freq: Once | INTRAMUSCULAR | Status: DC | PRN

## 2022-04-08 MED ORDER — DIPHENHYDRAMINE HCL 25 MG PO CAPS
25.0000 mg | ORAL_CAPSULE | Freq: Four times a day (QID) | ORAL | Status: DC | PRN
  Administered 2022-04-09: 25 mg via ORAL
  Filled 2022-04-08: qty 1

## 2022-04-08 MED ORDER — BUSPIRONE HCL 10 MG PO TABS
10.0000 mg | ORAL_TABLET | Freq: Three times a day (TID) | ORAL | Status: DC
  Administered 2022-04-09 – 2022-04-10 (×7): 10 mg via ORAL
  Filled 2022-04-08 (×7): qty 1

## 2022-04-08 NOTE — ED Provider Notes (Addendum)
[facility] EMERGENCY DEPARTMENT Provider Note   CSN: [id number] Arrival date & time: 04/08/22  5035     History  Chief Complaint  Patient presents with   Migraine    [name] is a 51 y.o. female.  Pt is a 51 year old female with a pmhx significant for ESRD on HD, anemia, depression, anxiety, ADD, asthma, GERD, obesity, arthritis, sleep apnea, neuromuscular d/o, and HTN.  Pt presents to the ED today with a headache, nausea and weakness.  She has leg swelling and sob.  Pt has not gone to dialysis since Tuesday, 5/30.  Pt said she has not felt well enough to go.        Home Medications Prior to Admission medications   Medication Sig Start Date End Date Taking? Authorizing Provider  acetaminophen (TYLENOL) 325 MG tablet Take 2 tablets (650 mg total) by mouth every 8 (eight) hours as needed for mild pain, fever or headache. 06/14/17  Yes Everrett Coombe, MD  albuterol (PROVENTIL HFA;VENTOLIN HFA) 108 (90 Base) MCG/ACT inhaler Inhale 2 puffs into the lungs every 6 (six) hours as needed for shortness of breath. 03/11/18  Yes Danford, Suann Larry, MD  ALPRAZolam Duanne Moron) 1 MG tablet Take 1 tablet (1 mg total) by mouth 3 (three) times daily. 02/23/22  Yes Boscia, Heather E, NP  amLODipine (NORVASC) 10 MG tablet Take 10 mg by mouth at bedtime.  02/08/14  Yes [provider]  budesonide-formoterol (SYMBICORT) 160-4.5 MCG/ACT inhaler Inhale 2 puffs into the lungs 2 (two) times daily. 12/03/21  Yes Boscia, Heather E, NP  busPIRone (BUSPAR) 10 MG tablet TAKE 1 TABLET BY MOUTH THREE TIMES A DAY Patient taking differently: Take 10 mg by mouth 3 (three) times daily. 01/04/22  Yes Boscia, Greer Ee, NP  carvedilol (COREG) 12.5 MG tablet Take 12.5 mg by mouth 2 (two) times daily. 03/27/22  Yes [provider]  citalopram (CELEXA) 20 MG tablet Take 20 mg by mouth daily. 03/09/22  Yes [provider]  cyclobenzaprine (FLEXERIL) 10 MG tablet Take 20 mg by  mouth 2 (two) times daily as needed for muscle spasms (takes at bedtime).   Yes [provider]  diphenhydrAMINE (BENADRYL) 25 mg capsule Take 25 mg by mouth every 6 (six) hours as needed for itching or allergies.   Yes [provider]  DULoxetine (CYMBALTA) 20 MG capsule TAKE 1 CAPSULE BY MOUTH EVERY DAY Patient taking differently: Take 20 mg by mouth daily. 03/22/22  Yes Boscia, Greer Ee, NP  furosemide (LASIX) 80 MG tablet Take 160 mg by mouth daily. 12/04/18  Yes [provider]  hydrALAZINE (APRESOLINE) 50 MG tablet Take 75 mg by mouth 3 (three) times daily. 12/04/18  Yes [provider]  HYDROmorphone (DILAUDID) 2 MG tablet Take 2 mg by mouth in the morning, at noon, in the evening, and at bedtime.   Yes [provider]  hydrOXYzine (VISTARIL) 25 MG capsule Take 25 mg by mouth 3 (three) times daily. 03/12/22  Yes [provider]  Lanthanum Carbonate (FOSRENOL) 1000 MG PACK Take 1,000 mg by mouth daily after breakfast. Mix with applesauce   Yes [provider]  lidocaine-prilocaine (EMLA) cream Apply 1 application topically See admin instructions. Apply topically one hour prior to dialysis - Monday, Wednesday, Friday   Yes [provider]  lisinopril (PRINIVIL,ZESTRIL) 40 MG tablet Take 40 mg by mouth daily. 12/04/18  Yes [provider]  Melatonin 5 MG TABS Take 5 mg by mouth  at bedtime.    Yes [provider]  methocarbamol (ROBAXIN) 500 MG tablet Take 500 mg by mouth 2 (two) times daily. 12/29/18  Yes [provider]  montelukast (SINGULAIR) 10 MG tablet Take 1 tablet (10 mg total) by mouth daily. 12/03/21  Yes Ronnell Freshwater, NP  multivitamin (RENA-VIT) TABS tablet Take 1 tablet by mouth daily.   Yes [provider]  mupirocin ointment (BACTROBAN) 2 % Apply te effected areas twice daily Patient taking differently: Apply 1 application. topically 2 (two) times daily as needed (wounds). 12/03/21   Yes Ronnell Freshwater, NP  naloxone (NARCAN) nasal spray 4 mg/0.1 mL Place 1 spray into the nose daily as needed (overdose).   Yes [provider]  ondansetron (ZOFRAN) 4 MG tablet Take 4 mg by mouth daily. 03/27/22  Yes [provider]  ondansetron (ZOFRAN-ODT) 4 MG disintegrating tablet Take 4 mg by mouth every 8 (eight) hours as needed for nausea/vomiting. 03/27/22  Yes [provider]  promethazine (PHENERGAN) 25 MG tablet Take 1 tablet (25 mg total) by mouth 2 (two) times daily as needed for nausea or vomiting. 12/03/21  Yes Ronnell Freshwater, NP  sevelamer carbonate (RENVELA) 800 MG tablet Take 1,600-2,400 mg by mouth daily as needed (when eating). Take 3 tablets with meals and 2 tablets with snacks   Yes [provider]  SUMAtriptan (IMITREX) 50 MG tablet Take 1 tablet (50 mg total) by mouth daily as needed for migraine or headache. 12/31/21  Yes Ronnell Freshwater, NP      Allergies    Clindamycin, Nsaids, Tolmetin, Vancomycin, Clarithromycin, Clindamycin/lincomycin, Demerol, Fentanyl, Other, Honey bee venom, Clarithromycin, and Morphine and related    Review of Systems   Review of Systems  Cardiovascular:  Positive for leg swelling.  Gastrointestinal:  Positive for nausea.  Neurological:  Positive for weakness.  All other systems reviewed and are negative.   Physical Exam Updated Vital Signs BP (!) 150/82 (BP Location: Right Arm)   Pulse 85   Temp 98.9 F (37.2 C) (Oral)   Resp 12   Ht '5\' 4"'$  (1.626 m)   Wt 75 kg   LMP 06/27/2014   SpO2 95%   BMI 28.38 kg/m  Physical Exam Vitals and nursing note reviewed.  Constitutional:      Appearance: Normal appearance.  HENT:     Head: Normocephalic and atraumatic.     Right Ear: External ear normal.     Left Ear: External ear normal.     Nose: Nose normal.     Mouth/Throat:     Mouth: Mucous membranes are moist.     Pharynx: Oropharynx is clear.  Eyes:     Extraocular Movements: Extraocular  movements intact.     Conjunctiva/sclera: Conjunctivae normal.     Pupils: Pupils are equal, round, and reactive to light.  Cardiovascular:     Rate and Rhythm: Normal rate and regular rhythm.     Pulses: Normal pulses.     Heart sounds: Normal heart sounds.  Pulmonary:     Effort: Pulmonary effort is normal.     Breath sounds: Normal breath sounds.  Abdominal:     General: Abdomen is flat. Bowel sounds are normal.     Palpations: Abdomen is soft.  Musculoskeletal:        General: Swelling and tenderness present.     Cervical back: Normal range of motion and neck supple.     Comments: + AVF with good thrill LUE  Skin:    General: Skin is warm.     Capillary Refill: Capillary refill takes less than 2 seconds.  Neurological:     General: No focal deficit present.     Mental Status: She is alert and oriented to person, place, and time.  Psychiatric:        Mood and Affect: Mood normal.        Behavior: Behavior normal.     ED Results / Procedures / Treatments   Labs (all labs ordered are listed, but only abnormal results are displayed) Labs Reviewed  BASIC METABOLIC PANEL - Abnormal; Notable for the following components:      Result Value   Potassium 6.7 (*)    Chloride 95 (*)    CO2 17 (*)    Glucose, Bld 132 (*)    BUN 103 (*)    Creatinine, Ser 15.46 (*)    GFR, Estimated 3 (*)    Anion gap 27 (*)    All other components within normal limits  CBC WITH DIFFERENTIAL/PLATELET - Abnormal; Notable for the following components:   RBC 2.71 (*)    Hemoglobin 8.6 (*)    HCT 27.5 (*)    MCV 101.5 (*)    RDW 18.0 (*)    Abs Immature Granulocytes 0.09 (*)    All other components within normal limits  BRAIN NATRIURETIC PEPTIDE - Abnormal; Notable for the following components:   B Natriuretic Peptide 1,066.6 (*)    All other components within normal limits  POTASSIUM - Abnormal; Notable for the following components:   Potassium 7.1 (*)    All other components within normal  limits  I-STAT CHEM 8, ED - Abnormal; Notable for the following components:   Sodium 133 (*)    Potassium 6.3 (*)    BUN 111 (*)    Creatinine, Ser 17.70 (*)    Glucose, Bld 128 (*)    TCO2 20 (*)    Hemoglobin 8.8 (*)    HCT 26.0 (*)    All other components within normal limits  CBG MONITORING, ED - Abnormal; Notable for the following components:   Glucose-Capillary 125 (*)    All other components within normal limits  SARS CORONAVIRUS 2 BY RT PCR  HEPATITIS B SURFACE ANTIGEN  HEPATITIS B SURFACE ANTIBODY,QUALITATIVE  HEPATITIS B SURFACE ANTIBODY, QUANTITATIVE  HEPATITIS B CORE ANTIBODY, TOTAL  HEPATITIS C ANTIBODY    EKG EKG Interpretation  Date/Time:  Thursday April 08 2022 09:30:24 EDT Ventricular Rate:  86 PR Interval:  222 QRS Duration: 96 QT Interval:  384 QTC Calculation: 459 R Axis:   -15 Text Interpretation: Sinus rhythm with 1st degree A-V block Cannot rule out Anterior infarct , age undetermined Abnormal ECG When compared with ECG of 29-Jan-2020 09:41, PREVIOUS ECG IS PRESENT No significant change since last tracing Confirmed by Isla Pence [phone]) on 04/08/2022 11:47:11 AM  Radiology DG Chest 2 View  Result Date: 04/08/2022 CLINICAL DATA:  Shortness of breath, missed dialysis EXAM: CHEST - 2 VIEW COMPARISON:  01/29/2020 FINDINGS: Bilateral mild interstitial thickening. No focal consolidation. No pleural effusion or pneumothorax. Stable cardiomegaly. No acute osseous abnormality. IMPRESSION: 1. Cardiomegaly with mild pulmonary vascular congestion. Electronically Signed   By: Kathreen Devoid M.D.   On: 04/08/2022 09:47    Procedures Procedures    Medications Ordered in ED Medications  Chlorhexidine Gluconate Cloth 2 % PADS 6 each (has no administration in time range)  cinacalcet (SENSIPAR) tablet 120 mg (has no administration in  time range)  calcitRIOL (ROCALTROL) capsule 1.25 mcg (has no administration in time range)  lanthanum (FOSRENOL) chewable tablet 1,000  mg (has no administration in time range)  ondansetron (ZOFRAN) injection 4 mg (4 mg Intravenous Given 04/08/22 1245)  HYDROmorphone (DILAUDID) injection 0.5 mg (0.5 mg Intravenous Given 04/08/22 1247)  sodium zirconium cyclosilicate (LOKELMA) packet 10 g (10 g Oral Given 04/08/22 1256)  sodium bicarbonate injection 50 mEq (50 mEq Intravenous Given 04/08/22 1248)  insulin aspart (novoLOG) injection 10 Units (10 Units Intravenous Given 04/08/22 1255)    And  dextrose 50 % solution 50 mL (50 mLs Intravenous Given 04/08/22 1252)    ED Course/ Medical Decision Making/ A&P                           Medical Decision Making Amount and/or Complexity of Data Reviewed Labs: ordered.  Risk OTC drugs. Prescription drug management. Decision regarding hospitalization.   This patient presents to the ED for concern of headache and nausea, this involves an extensive number of treatment options, and is a complaint that carries with it a high risk of complications and morbidity.  The differential diagnosis includes esrd complication, infection, electrolyte abn   Co morbidities that complicate the patient evaluation  ESRD on HD, anemia, depression, anxiety, ADD, asthma, GERD, obesity, arthritis, sleep apnea, neuromuscular d/o, and HTN   Additional history obtained:  Additional history obtained from epic chart review External records from outside source obtained and reviewed including husband   Lab Tests:  I Ordered, and personally interpreted labs.  The pertinent results include:  cbc with chronic anemia with hgb 8.6; bmp with K elevated at 6.7; bun is 103 and cr 15.46; bnp elevated at 1067   Imaging Studies ordered:  I ordered imaging studies including CXR  I independently visualized and interpreted imaging which showed  IMPRESSION:  1. Cardiomegaly with mild pulmonary vascular congestion.   I agree with the radiologist interpretation   Cardiac Monitoring:  The patient was maintained on a  cardiac monitor.  I personally viewed and interpreted the cardiac monitored which showed an underlying rhythm of: nsr   Medicines ordered and prescription drug management:  I ordered medication including dilaudid and zofran  for pain and nausea;  insulin/glucose, bicarb and lokelma for hyperkalemia Reevaluation of the patient after these medicines showed that the patient improved I have reviewed the patients home medicines and have made adjustments as needed   Critical Interventions:  Hyperkalemia tx   Consultations Obtained:  I requested consultation with the nephrologist (Dr. Carolin Sicks),  and discussed lab and imaging findings as well as pertinent plan - he recommends admission to medicine as she will likely need more than 1 dialysis session.  He will put her on the list for dialysis. Pt d/w FP for admission.   Problem List / ED Course:  Hyperkalemia:  Treated with Lokelma, insulin/glucose/bicarb.  Dialysis pending.  Repeat K 7.1.  This was drawn prior to receiving meds for hyperkalemia.  It was drawn for confirmation of hyperkalemia. Headache and nausea:  pt given dilaudid and zofran.   Reevaluation:  After the interventions noted above, I reevaluated the patient and found that they have :improved   Social Determinants of Health:  Lives at home with husband   Dispostion:  After consideration of the diagnostic results and the patients response to treatment, I feel that the patent would benefit from admission.    CRITICAL CARE Performed by: Isla Pence  Total critical care time: 30 minutes  Critical care time was exclusive of separately billable procedures and treating other patients.  Critical care was necessary to treat or prevent imminent or life-threatening deterioration.  Critical care was time spent personally by me on the following activities: development of treatment plan with patient and/or surrogate as well as nursing, discussions with consultants,  evaluation of patient's response to treatment, examination of patient, obtaining history from patient or surrogate, ordering and performing treatments and interventions, ordering and review of laboratory studies, ordering and review of radiographic studies, pulse oximetry and re-evaluation of patient's condition.         Final Clinical Impression(s) / ED Diagnoses Final diagnoses:  ESRD on hemodialysis ([place])  Hyperkalemia  Noncompliance of patient with renal dialysis    Rx / DC Orders ED Discharge Orders     None         Isla Pence, MD 04/08/22 [place], Keyton Bhat, MD 04/08/22 1410

## 2022-04-08 NOTE — ED Triage Notes (Addendum)
Pt states she has had a migraine since last Tuesday. Pt's last dialysis was last Tuesday, although she normally goes MWF. Pt feels SOB and thinks she may have a fever. Pt also complains of lower leg swelling, nausea and dizziness.

## 2022-04-08 NOTE — Progress Notes (Signed)
Patient arrived from ED to receive HD treatment in [facility]. Complaints of "throbbing and achy" pains in lower legs. 10/10 pain noted. Patient is resting with eyes closed, breathing unlabored. Easily aroused. This nurse advised the need to monitor her BP closely as tx is initiated. Will monitor and provide appropriate medication once she is receiving tx. Patient agreed.

## 2022-04-08 NOTE — Assessment & Plan Note (Addendum)
Improved electrolytes since HD, K today slightly low at 3.4 - Daily BMP

## 2022-04-08 NOTE — ED Provider Triage Note (Signed)
Emergency Medicine Provider Triage Evaluation Note  [name] , a 51 y.o. female  was evaluated in triage.  Pt complains of not feeling well. M/W/F dialysis, last had dialysis 03/30/22. Reports SHOB, leg swelling, migraine. Unable to say when these symptoms started exactly, unsure if this started before or after her last dialysis. Was planning to go to dialysis today but felt unwell and came to the ER.  Review of Systems  Positive: Migraine, leg swelling, SHOB Negative: CP  Physical Exam  BP (!) 154/80 (BP Location: Right Arm)   Pulse 86   Temp 98.9 F (37.2 C) (Oral)   Resp 18   LMP 06/27/2014   SpO2 100%  Gen:   Awake, no distress   Resp:  Normal effort  MSK:   Moves extremities without difficulty  Other:  Lungs CTA  Medical Decision Making  Medically screening exam initiated at 9:28 AM.  Appropriate orders placed.  [name] was informed that the remainder of the evaluation will be completed by another provider, this initial triage assessment does not replace that evaluation, and the importance of remaining in the ED until their evaluation is complete.     Sarah Learn, PA-C 04/08/22 0930

## 2022-04-08 NOTE — Assessment & Plan Note (Addendum)
Per nephrology, patient will likely need another round of HD today. For chronic anemia of ESRD, transfusion threshold <7 as patient without CAD.

## 2022-04-08 NOTE — Progress Notes (Signed)
Patient remains in semi-fowlers position, resting with eyes closed. Breathing even and unlabored. Report given and acknowledged by Genia Harold, RN. Questions/concerns addressed.

## 2022-04-08 NOTE — Consult Note (Signed)
[facility] Nephrology Consult Note: Reason for Consult: To manage dialysis and dialysis related needs Referring Physician: Dr. Gilford Raid, Almyra Free  HPI:  [name] is an 51 y.o. female with history of hypertension, HLD, anemia, ESRD on HD MWF presents today with nausea vomiting and generalized weakness seen as a consultation for the management of ESRD.  The patient stated that she was not feeling well and noted leg swelling.  Her last dialysis was on 5/30 and missed treatment since then because of not feeling well.  She lives with her husband.  She also has dyspnea on exertion and nonspecific body pain.  Denies fever, chills, chest pain.  She has AV fistula for the access. In the ER her blood pressure was 154/80, in room air, potassium was 6.7, BNP 1066, hemoglobin 8.6.  She received insulin, dextrose, sodium bicarbonate and Lokelma.  The potassium level initially improved to 6.3 however the repeat labs showed elevated to 7.1.   OP HD orders:  Dialyzes at  [facility], MWF, 4 hours,  EDW 75.5 . HD Bath 2k 2ca , 400/500, last Op rx on 5/30. LUE AVF Mircera 150 mcg on 5/30 Calcitriol 1.25 mcg 3x/week Sensipar 120 mg 3x/week  Past Medical History:  Diagnosis Date   Abscess of tendon sheath, left ankle and foot 02/14/2014   ADD (attention deficit disorder)    Anemia    Anemia    Anxiety    Arthritis    knee, wrist, back    Asthma    Blood transfusion 06/2011   6 units transfused at [facility]   Bronchitis    Depression    Diverticulitis    Dizziness    ESRD (end stage renal disease) ([place])    Hemodialysis 3 times a  week - [place]   Gastritis    GERD (gastroesophageal reflux disease)    Gout    Headache(784.0)    migraines   Hypertension    sees Dr. Wendie Agreste   Neuromuscular disorder [facility])    carpal tunnel   Obesity    Pneumonia    PONV (postoperative nausea and vomiting)    Reflux    Renal insufficiency    Shortness of breath    Sleep apnea    does  not use CPAP    Past Surgical History:  Procedure Laterality Date   APPLICATION OF WOUND VAC Left 06/24/2017   Procedure: APPLICATION OF WOUND VAC;  Surgeon: Renette Butters, MD;  Location: [place];  Service: Orthopedics;  Laterality: Left;   AV FISTULA PLACEMENT  06/10/11   Left brachiocephalic AVF   AV Fistula surgery  11/30/11   [facility] - redo fistula   Carpel tunnel release     x 2; bilateral   CESAREAN SECTION     colonscopy     DILATION AND CURETTAGE OF UTERUS     HYSTEROSCOPY WITH THERMACHOICE  12/10/2011   Procedure: HYSTEROSCOPY WITH THERMACHOICE;  Surgeon: Betsy Coder, MD;  Location: [facility];  Service: Gynecology;  Laterality: N/A;   I & D EXTREMITY Left 06/12/2017   Procedure: IRRIGATION AND DEBRIDEMENT FOOT;  Surgeon: Marchia Bond, MD;  Location: [place];  Service: Orthopedics;  Laterality: Left;   I & D EXTREMITY Left 06/14/2017   Procedure: IRRIGATION AND DEBRIDEMENT EXTREMITY/WOUND CLOSURE;  Surgeon: Renette Butters, MD;  Location: [place];  Service: Orthopedics;  Laterality: Left;   I & D EXTREMITY Left 06/27/2017   Procedure: IRRIGATION AND DEBRIDEMENT LEFT TIBIA;  Surgeon: Percell Miller,  Ernesta Amble, MD;  Location: [place];  Service: Orthopedics;  Laterality: Left;   INCISION AND DRAINAGE ABSCESS Left 06/24/2017   Procedure: INCISION AND DRAINAGE ABSCESS LEFT TIBIA AND APPLICATION OF WOUND VAC;  Surgeon: Renette Butters, MD;  Location: [place];  Service: Orthopedics;  Laterality: Left;   IRRIGATION AND DEBRIDEMENT ABSCESS Left 02/15/2014   Procedure: IRRIGATION AND DRAINAGE OF LOW BACK ABSCESS;  Surgeon: Imogene Burn. Georgette Dover, MD;  Location: [place];  Service: General;  Laterality: Left;   microdiskectomy  04/2001   Herniated disk L5-S1   RENAL BIOPSY  04/2004   REVISON OF ARTERIOVENOUS FISTULA Left 01/04/2017   Procedure: REVISON OF ARTERIOVENOUS FISTULA; CEPHALIC VEIN TURNDOWN;  Surgeon: Angelia Mould, MD;  Location: [place];  Service: Vascular;  Laterality: Left;   REVISON OF  ARTERIOVENOUS FISTULA Left 03/02/2018   Procedure: REVISION OF BRACHIOCEPHALIC ARTERIOVENOUS FISTULA LEFT UPPER ARM USING ARTEGRAFT;  Surgeon: Waynetta Sandy, MD;  Location: [place];  Service: Vascular;  Laterality: Left;   SHUNTOGRAM  Dec. 18, 2013   Left arm   SHUNTOGRAM N/A 10/18/2012   Procedure: Earney Mallet;  Surgeon: Conrad [place], MD;  Location: [facility] CATH LAB;  Service: Cardiovascular;  Laterality: N/A;   SHUNTOGRAM Left 04/18/2013   Procedure: Earney Mallet;  Surgeon: Serafina Mitchell, MD;  Location: [facility] CATH LAB;  Service: Cardiovascular;  Laterality: Left;   SPINE SURGERY  2004   Lumbar diskectomy    UPPER GASTROINTESTINAL ENDOSCOPY     WISDOM TOOTH EXTRACTION      Family History  Problem Relation Age of Onset   Hypertension Mother    Heart disease Father        Heart Disease before age 53   Hypertension Father    Heart attack Father    Hypertension Daughter     Social History:  reports that she quit smoking about 4 years ago. Her smoking use included cigarettes. She has a 9.00 pack-year smoking history. She has never used smokeless tobacco. She reports that she does not currently use alcohol. She reports that she does not use drugs.  Allergies:  Allergies  Allergen Reactions   Clindamycin     Other reaction(s): Hives   Nsaids Other (See Comments)    Renal failure   Tolmetin Other (See Comments)    Renal failure   Vancomycin Hives, Other (See Comments) and Anaphylaxis    ? 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 Other reaction(s): Hives, Other (See Comments) ? 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    Clarithromycin Nausea And Vomiting   Clindamycin/Lincomycin Hives and Rash   Demerol Nausea And Vomiting and Other (See Comments)    Severe headaches   Fentanyl Hives, Nausea And Vomiting, Rash and Other (See Comments)    Reaction to patches only Other reaction(s): Unknown Reaction to patches only Fentanyl patch    Other Other (See Comments), Nausea And Vomiting and Rash    Seeds and nuts- has  diverticulitis  Other reaction(s): Other (See Comments) Severe headache, Pt does not want it!   Honey Bee Venom Swelling   Clarithromycin Nausea And Vomiting   Morphine And Related Nausea And Vomiting and Other (See Comments)    Severe headache, Pt does not want it!    Medications: I have reviewed the patient's current medications.   Results for orders placed or performed during the hospital encounter of 04/08/22 (from the past 48 hour(s))  Basic metabolic panel     Status: Abnormal   Collection Time: 04/08/22  9:34 AM  Result Value  Ref Range   Sodium 139 135 - 145 mmol/L   Potassium 6.7 (HH) 3.5 - 5.1 mmol/L    Comment: NO VISIBLE HEMOLYSIS CRITICAL RESULT CALLED TO, READ BACK BY AND VERIFIED WITH: HOPE DOOLEY,RN AT 1050 04/08/2022 BY ZBEECH.    Chloride 95 (L) 98 - 111 mmol/L   CO2 17 (L) 22 - 32 mmol/L   Glucose, Bld 132 (H) 70 - 99 mg/dL    Comment: Glucose reference range applies only to samples taken after fasting for at least 8 hours.   BUN 103 (H) 6 - 20 mg/dL   Creatinine, Ser 15.46 (H) 0.44 - 1.00 mg/dL   Calcium 9.9 8.9 - 10.3 mg/dL   GFR, Estimated 3 (L) >60 mL/min    Comment: (NOTE) Calculated using the CKD-EPI Creatinine Equation (2021)    Anion gap 27 (H) 5 - 15    Comment: Electrolytes repeated to confirm. Performed at [facility], [place] [address], [place], [place] [address]   CBC with Differential     Status: Abnormal   Collection Time: 04/08/22  9:34 AM  Result Value Ref Range   WBC 6.8 4.0 - 10.5 K/uL   RBC 2.71 (L) 3.87 - 5.11 MIL/uL   Hemoglobin 8.6 (L) 12.0 - 15.0 g/dL   HCT 27.5 (L) 36.0 - 46.0 %   MCV 101.5 (H) 80.0 - 100.0 fL   MCH 31.7 26.0 - 34.0 pg   MCHC 31.3 30.0 - 36.0 g/dL   RDW 18.0 (H) 11.5 - 15.5 %   Platelets 206 150 - 400 K/uL   nRBC 0.0 0.0 - 0.2 %   Neutrophils Relative % 77 %   Neutro Abs 5.1 1.7 - 7.7 K/uL   Lymphocytes Relative 16 %   Lymphs Abs 1.1 0.7 - 4.0 K/uL   Monocytes Relative 6 %   Monocytes Absolute 0.4  0.1 - 1.0 K/uL   Eosinophils Relative 0 %   Eosinophils Absolute 0.0 0.0 - 0.5 K/uL   Basophils Relative 0 %   Basophils Absolute 0.0 0.0 - 0.1 K/uL   Immature Granulocytes 1 %   Abs Immature Granulocytes 0.09 (H) 0.00 - 0.07 K/uL    Comment: Performed at [facility] [address], [place], [place] [address]  Brain natriuretic peptide     Status: Abnormal   Collection Time: 04/08/22  9:34 AM  Result Value Ref Range   B Natriuretic Peptide 1,066.6 (H) 0.0 - 100.0 pg/mL    Comment: Performed at [facility] [address] [place], [place] [address]  I-stat chem 8, ED     Status: Abnormal   Collection Time: 04/08/22 10:03 AM  Result Value Ref Range   Sodium 133 (L) 135 - 145 mmol/L   Potassium 6.3 (HH) 3.5 - 5.1 mmol/L   Chloride 99 98 - 111 mmol/L   BUN 111 (H) 6 - 20 mg/dL   Creatinine, Ser 17.70 (H) 0.44 - 1.00 mg/dL   Glucose, Bld 128 (H) 70 - 99 mg/dL    Comment: Glucose reference range applies only to samples taken after fasting for at least 8 hours.   Calcium, Ion 1.16 1.15 - 1.40 mmol/L   TCO2 20 (L) 22 - 32 mmol/L   Hemoglobin 8.8 (L) 12.0 - 15.0 g/dL   HCT 26.0 (L) 36.0 - 46.0 %   Comment NOTIFIED PHYSICIAN     DG Chest 2 View  Result Date: 04/08/2022 CLINICAL DATA:  Shortness of breath, missed dialysis EXAM: CHEST -  2 VIEW COMPARISON:  01/29/2020 FINDINGS: Bilateral mild interstitial thickening. No focal consolidation. No pleural effusion or pneumothorax. Stable cardiomegaly. No acute osseous abnormality. IMPRESSION: 1. Cardiomegaly with mild pulmonary vascular congestion. Electronically Signed   By: Kathreen Devoid M.D.   On: 04/08/2022 09:47    ROS: As per H&P.  The rest of the systems are reviewed and are negative. Blood pressure (!) 150/82, pulse 85, temperature 98.9 F (37.2 C), temperature source Oral, resp. rate 12, height '5\' 4"'$  (1.626 m), weight 75 kg, last menstrual period 06/27/2014, SpO2 95 %. Gen: NAD, comfortable Respiratory: Clear bilateral, no  wheezing or crackle Cardiovascular: Regular rate rhythm S1-S2 normal, no rubs GI: Abdomen soft, nontender, nondistended Extremities, no cyanosis or clubbing, trace leg edema present Skin: No rash or ulcer Neurology: Alert, awake, following commands, oriented Dialysis Access: Left upper extremity AV fistula has good thrill and bruit  Assessment/Plan:  #Hyperkalemia: Received medical treatment in ER without much help.  We will arrange for urgent dialysis today.  We will do 1K bath first hour and then 2K bath.  Monitor lab.  # ESRD: MWF, missed outpatient dialysis.  Last outpatient HD on 5/30.  Arrange for dialysis today.  Likely need another regular HD tomorrow.  AV fistula for the access.  # Hypertension: HD today.  UF during HD.  Monitor blood pressure.  Resume home antihypertensives.  # Anemia of ESRD: Received Mircera on 5/30.  Monitor hemoglobin.  Transfuse as needed.  #Secondary hyperparathyroidism/metabolic Bone Disease: Resume Sensipar, calcitriol with HD.  Phos renal for binders.  Thank you for the consult.  We will follow with you.  Hazelee Harbold Tanna Furry 04/08/2022, 1:02 PM

## 2022-04-08 NOTE — H&P (Addendum)
Hospital Admission History and Physical Service Pager: [phone]  Patient name: [name] Medical record number: [id number] Date of Birth: [birth year] Age: 51 y.o. Gender: female  Primary Care Provider: Ronnell Freshwater, NP Consultants: Nephrology Code Status: Partial-CPR and BiPAP, but no intubation Preferred Emergency Contact:  Contact Information     Name Relation Home Work Mobile   [place] Spouse [phone]  [place] Mother [phone]  [phone]   [place],Donna Sister [phone]    Olene Craven Daughter [phone]          Chief Complaint: Generalized weakness  Assessment and Plan: [name] is a 51 y.o. female presenting with generalized weakness, nausea, and vomiting after multiple missed HD sessions. Differential for presentation of this includes hyperkalemia and uremia in the setting of missed HD.     * ESRD on dialysis [facility]) Patient on M/W/F dialysis schedule.  Has not been dialyzed since 5/30.  Nephrology consulted, and plan multiple rounds of dialysis this admission.  Chronic anemia (baseline 8-9), hyperkalemia on admission 6.7, increased to 7.1.   - Dialysis today, will likely need another round tomorrow -For chronic anemia, transfusion threshold <7 as patient without CAD.   Hyperkalemia K+ 6.7 on admission.  Received insulin, dextrose, sodium bicarb, and Lokelma in the ED which initially improved to 6.3. Repeat potassium elevated to 7.1, so   nephrology to emergently dialyze today with 1K bath first hour, and then 2K bath. We will continue to monitor on daily BMP    Chronic conditions: Hypertension-continue home Norvasc 10 mg, Coreg 12.5 mg twice daily, hydralazine 75 mg 3 times daily Chronic pain-Tylenol 650 mg every 8 hours as needed, Flexeril 20 mg twice daily as needed, Dilaudid 2 mg every 6 hours, Robaxin 500 mg twice daily Anxiety-continue home Xanax 1 mg 3 times daily, BuSpar 10 mg  3 times daily Depression-continue Celexa 20 mg daily, Cymbalta 20 mg Nausea-Phenergan 25 mg twice daily as needed, Zofran 4 mg daily Migraines-sumatriptan 50 mg daily as needed Seasonal allergies-Singulair 10 mg daily Asthma vs. Chronic bronchitis-Rescue inhaler, Symbicort 2 puffs twice daily   FEN/GI: Renal VTE Prophylaxis: SQ heparin  Disposition: Admit to progressive  History of Present Illness:  [name] is a 51 y.o. female presenting with N/V and generalized weakness in the setting of missed dialysis.  On assessment, patient reports progressively worsening generalized weakness after missing HD since 5/30.  Also with worsening swelling in both legs and feet, right after 1st missed session.  She reports subjective fever, nausea and vomiting (3-4 times) last night, but denies today. She also endorses left-sided chest pain described as pressured and worse when lying down and with exertion, as well as ongoing mild SOB, which is not new.  Also reports some dizziness upon standing, which is also not new.  However the dyspnea and dizziness upon standing have seemed to worsen since missing HD.  She reports using 2L O2 at HD. Denies diarrhea/constipation, palpitations.  In the ED, given Lokelma, insulin, glucose, and bicarb.  K+ improved from 6.7->6.3, then repeat to 7.1.  For nausea, she was given Dilaudid and Zofran.  Review Of Systems: Per HPI with the following additions:   Pertinent Past Medical History: Chronic pain ESRD on HD Anxiety Migraine headaches HTN  Remainder reviewed in history tab.   Pertinent Past Surgical History: AV fistula placed 2012 with revisions in 2013, 2018 and 2019 Lumbar discectomy in 2004 Remainder reviewed in history tab.  Pertinent Social History: Tobacco  use: Yes/No/Former Alcohol use: Occasional Other Substance use: Denies Lives with mom, for whom she is caretaker and husband  Pertinent Family History: Mom-hypertension, stroke,  migraines Dad-heart disease, hypertension Sister-T2DM  Remainder reviewed in history tab.   Important Outpatient Medications: See above Remainder reviewed in medication history.   Objective: BP 138/76   Pulse 88   Temp 98.9 F (37.2 C) (Oral)   Resp 17   Ht '5\' 4"'$  (1.626 m)   Wt 75 kg   LMP 06/27/2014   SpO2 95%   BMI 28.38 kg/m  Exam: General: Female patient lying in bed in NAD Eyes: EOMI, no scleral icterus Neck: Supple, normal ROM Cardiovascular: RRR, no murmur/rub/gallops appreciated; 1+ pretibial edema of bilateral lower extremities Respiratory: CTA x2 Gastrointestinal: Soft, NT/ND MSK: Normal ROM of all extremities.  Swelling of left hand in comparison to right.  Normal sensation of extremity. Derm: Warm and dry, normal skin turgor Neuro: No focal deficits; Psych: Reports significant stressors, low mood.  Labs:  CBC BMET  Recent Labs  Lab 04/08/22 0934 04/08/22 1003  WBC 6.8  --   HGB 8.6* 8.8*  HCT 27.5* 26.0*  PLT 206  --    Recent Labs  Lab 04/08/22 0934 04/08/22 1003 04/08/22 1238 04/08/22 1700  NA 139 133*  --   --   K 6.7* 6.3*   < > 6.7*  CL 95* 99  --   --   CO2 17*  --   --   --   BUN 103* 111*  --   --   CREATININE 15.46* 17.70*  --   --   GLUCOSE 132* 128*  --   --   CALCIUM 9.9  --   --   --    < > = values in this interval not displayed.    Pertinent additional labs BNP 1066.  EKG: My own interpretation (not copied from electronic read) sinus rhythm, increased PR interval consistent with first-degree AV block   Imaging Studies Performed:  Imaging Study (ie. Chest x-ray) Impression from Radiologist:  IMPRESSION: 1. Cardiomegaly with mild pulmonary vascular congestion.     Electronically Signed   By: Kathreen Devoid M.D.   On: 04/08/2022 09:47   My Interpretation: Cardiomegaly, some possible infiltrates versus congestion within the pulmonary vasculature.   Rosezetta Schlatter, MD 04/08/2022, 7:21 PM PGY-1, [place] Intern pager: [phone], text pages welcome Secure chat group [place]

## 2022-04-09 ENCOUNTER — Inpatient Hospital Stay (HOSPITAL_COMMUNITY): Payer: Medicare Other

## 2022-04-09 DIAGNOSIS — N186 End stage renal disease: Secondary | ICD-10-CM

## 2022-04-09 DIAGNOSIS — Z992 Dependence on renal dialysis: Secondary | ICD-10-CM

## 2022-04-09 LAB — RENAL FUNCTION PANEL
Albumin: 3.5 g/dL (ref 3.5–5.0)
Anion gap: 15 (ref 5–15)
BUN: 36 mg/dL — ABNORMAL HIGH (ref 6–20)
CO2: 28 mmol/L (ref 22–32)
Calcium: 9.8 mg/dL (ref 8.9–10.3)
Chloride: 95 mmol/L — ABNORMAL LOW (ref 98–111)
Creatinine, Ser: 7.85 mg/dL — ABNORMAL HIGH (ref 0.44–1.00)
GFR, Estimated: 6 mL/min — ABNORMAL LOW (ref >60)
Glucose, Bld: 92 mg/dL (ref 70–99)
Phosphorus: 6.3 mg/dL — ABNORMAL HIGH (ref 2.5–4.6)
Potassium: 4.2 mmol/L (ref 3.5–5.1)
Sodium: 138 mmol/L (ref 135–145)

## 2022-04-09 LAB — HIV ANTIBODY (ROUTINE TESTING W REFLEX): HIV Screen 4th Generation wRfx: NONREACTIVE

## 2022-04-09 LAB — CBC
HCT: 29.1 % — ABNORMAL LOW (ref 36.0–46.0)
Hemoglobin: 9.1 g/dL — ABNORMAL LOW (ref 12.0–15.0)
MCH: 31.5 pg (ref 26.0–34.0)
MCHC: 31.3 g/dL (ref 30.0–36.0)
MCV: 100.7 fL — ABNORMAL HIGH (ref 80.0–100.0)
Platelets: 198 10*3/uL (ref 150–400)
RBC: 2.89 MIL/uL — ABNORMAL LOW (ref 3.87–5.11)
RDW: 17.8 % — ABNORMAL HIGH (ref 11.5–15.5)
WBC: 4.7 10*3/uL (ref 4.0–10.5)
nRBC: 0 % (ref 0.0–0.2)

## 2022-04-09 LAB — TROPONIN I (HIGH SENSITIVITY): Troponin I (High Sensitivity): 12 ng/L (ref <18)

## 2022-04-09 LAB — HEPATITIS B SURFACE ANTIBODY, QUANTITATIVE: Hep B S AB Quant (Post): 182 m[IU]/mL (ref >9.9)

## 2022-04-09 MED ORDER — MONTELUKAST SODIUM 10 MG PO TABS
10.0000 mg | ORAL_TABLET | Freq: Every day | ORAL | Status: DC
  Administered 2022-04-10: 10 mg via ORAL
  Filled 2022-04-09: qty 1

## 2022-04-09 MED ORDER — CHLORHEXIDINE GLUCONATE CLOTH 2 % EX PADS
6.0000 | MEDICATED_PAD | Freq: Every day | CUTANEOUS | Status: DC
  Administered 2022-04-09 – 2022-04-10 (×2): 6 via TOPICAL

## 2022-04-09 NOTE — Progress Notes (Signed)
Patient refused vitals this morning.  She just want to sleep at this time.

## 2022-04-09 NOTE — Plan of Care (Signed)
  Problem: Education: Goal: Knowledge of General Education information will improve Description: Including pain rating scale, medication(s)/side effects and non-pharmacologic comfort measures Outcome: Progressing   Problem: Clinical Measurements: Goal: Ability to maintain clinical measurements within normal limits will improve Outcome: Progressing Goal: Will remain free from infection Outcome: Progressing   

## 2022-04-09 NOTE — Progress Notes (Signed)
Pt UF set for 2.0L

## 2022-04-09 NOTE — Progress Notes (Signed)
Pre-treatment blood pressure

## 2022-04-09 NOTE — Progress Notes (Signed)
MD Adah Salvage made aware of refusal of heparin and lasix

## 2022-04-09 NOTE — Progress Notes (Signed)
     Daily Progress Note Intern Pager: [phone]  Patient name: [name] Medical record number: [id number] Date of birth: 09/13/1971 Age: 51 y.o. Gender: female  Primary Care Provider: Ronnell Freshwater, NP Consultants: Nephrology Code Status: Partial  Pt Overview and Major Events to Date:  6/8: Admitted and dialyzed  Assessment and Plan:  [name] is a 51 year old female who presented with generalized weakness, nausea, and vomiting after multiple missed HD sessions. Pertinent PMH/PSH includes ESRD on HD, HTN, chronic pain, migraine headaches, anxiety.   * ESRD on dialysis [facility]) Per nephrology, patient will likely need another round of HD today. For chronic anemia of ESRD, transfusion threshold <7 as patient without CAD.   Hyperkalemia A.m. potassium today 4.2.  We will continue to monitor on daily BMP.    FEN/GI: Renal PPx: SQ heparin Dispo:Home pending clinical improvement . Barriers include need for HD.   Subjective:  Patient seen at bedside in HD.  She is concerned about her mother, who is being admitted to the hospital, but she is unaware of her whereabouts.  She reports chronic pain and nausea, but denies further emetic episodes.  Objective: Temp:  [98.9 F (37.2 C)] 98.9 F (37.2 C) (06/08 0928) Pulse Rate:  [82-135] 93 (06/09 0010) Resp:  [11-26] 18 (06/09 0010) BP: (125-169)/(69-93) 169/92 (06/09 0010) SpO2:  [91 %-100 %] 96 % (06/09 0010) Weight:  [75 kg] 75 kg (06/08 1804) Physical Exam: General: Chronically ill appearing female patient lying in bed in NAD Cardiovascular: Regular rate on monitoring Respiratory: Normal work of breathing on room air Abdomen: Soft, NT/ND Extremities: Trace pretibial edema bilaterally.  Swollen right hand in comparison to left; mostly normal but slowed range of motion of hand.  Unable to bend DIP of thumb.  Laboratory: Most recent CBC Lab Results  Component Value Date   WBC 4.7 04/09/2022   HGB 9.1  (L) 04/09/2022   HCT 29.1 (L) 04/09/2022   MCV 100.7 (H) 04/09/2022   PLT 198 04/09/2022   Most recent BMP    Latest Ref Rng & Units 04/09/2022    4:40 AM  BMP  Glucose 70 - 99 mg/dL 92   BUN 6 - 20 mg/dL 36   Creatinine 0.44 - 1.00 mg/dL 7.85   Sodium 135 - 145 mmol/L 138   Potassium 3.5 - 5.1 mmol/L 4.2   Chloride 98 - 111 mmol/L 95   CO2 22 - 32 mmol/L 28   Calcium 8.9 - 10.3 mg/dL 9.8       Imaging/Diagnostic Tests: No new images to review  Rosezetta Schlatter, MD 04/09/2022, 7:31 AM  PGY-1, [place] Intern pager: [phone], text pages welcome Secure chat group [place]

## 2022-04-09 NOTE — Progress Notes (Signed)
Patient ID: [name], female   DOB: 1971/01/03, 51 y.o.   MRN: [id number] [facility] Progress Note   Assessment/ Plan:   1.  Hyperkalemia: Secondary to missed outpatient dialysis treatments due to various reasons and corrected with hemodialysis overnight. 2. ESRD: Usually on a Monday/Wednesday/Friday dialysis schedule and appears to have missed at least a week of dialysis treatments prior to admission to the hospital.  Underwent emergent hemodialysis yesterday for hyperkalemia and will get elective hemodialysis today. 3. Anemia: Hemoglobin and hematocrit are low without overt blood loss, continue to monitor trend and restart ESA. 4. CKD-MBD: Elevated phosphorus level with normal calcium level noted, continue phosphorus binders with ongoing renal diet 5. Nutrition: Continue renal diet with fluid restriction, oral nutritional supplementations as indicated. 6. Hypertension: Blood pressures elevated but overall noted to be improving, monitor with ultrafiltration at hemodialysis.  Subjective:   Reports to be feeling better here and cites multiple social reasons for missing dialysis.   Objective:   BP (!) 152/78 (BP Location: Right Arm)   Pulse 86   Temp 98.5 F (36.9 C) (Oral)   Resp 18   Ht '5\' 4"'$  (1.626 m)   Wt 75 kg   LMP 06/27/2014   SpO2 99%   BMI 28.38 kg/m   Physical Exam: Gen: Comfortably resting in bed, husband at bedside CVS: Pulse regular rhythm, normal rate, S1 and S2 normal Resp: Clear to auscultation, no rales/rhonchi Abd: Soft, obese, nontender, bowel sounds normal Ext: Trace lower extremity edema, left upper arm AV fistula with intact dressing  Labs: BMET Recent Labs  Lab 04/08/22 0934 04/08/22 1003 04/08/22 1238 04/08/22 1700 04/09/22 0440  NA 139 133*  --   --  138  K 6.7* 6.3* 7.1* 6.7* 4.2  CL 95* 99  --   --  95*  CO2 17*  --   --   --  28  GLUCOSE 132* 128*  --   --  92  BUN 103* 111*  --   --  36*  CREATININE 15.46* 17.70*   --   --  7.85*  CALCIUM 9.9  --   --   --  9.8  PHOS  --   --   --   --  6.3*   CBC Recent Labs  Lab 04/08/22 0934 04/08/22 1003 04/09/22 0440  WBC 6.8  --  4.7  NEUTROABS 5.1  --   --   HGB 8.6* 8.8* 9.1*  HCT 27.5* 26.0* 29.1*  MCV 101.5*  --  100.7*  PLT 206  --  198      Medications:     ALPRAZolam  1 mg Oral TID   amLODipine  10 mg Oral QHS   busPIRone  10 mg Oral TID   calcitRIOL  1.25 mcg Oral Q M,W,F   carvedilol  12.5 mg Oral BID   Chlorhexidine Gluconate Cloth  6 each Topical Q0600   cinacalcet  120 mg Oral Q M,W,F   citalopram  20 mg Oral Daily   DULoxetine  20 mg Oral Daily   furosemide  160 mg Oral Daily   heparin injection (subcutaneous)  5,000 Units Subcutaneous Q8H   hydrALAZINE  75 mg Oral TID   HYDROmorphone  2 mg Oral Q6H   lanthanum  1,000 mg Oral TID WC   lisinopril  40 mg Oral Daily   melatonin  5 mg Oral QHS   methocarbamol  500 mg Oral BID   mometasone-formoterol  2 puff Inhalation BID  montelukast  10 mg Oral Daily   multivitamin  1 tablet Oral Daily   ondansetron  4 mg Oral Daily   Elmarie Shiley, MD 04/09/2022, 9:27 AM

## 2022-04-09 NOTE — Progress Notes (Signed)
FPTS Brief Progress Note  S: sleeping   O: BP (!) 169/92 (BP Location: Right Arm)   Pulse 93   Temp 98.9 F (37.2 C) (Oral)   Resp 18   Ht '5\' 4"'$  (1.626 m)   Wt 75 kg   LMP 06/27/2014   SpO2 96%   BMI 28.38 kg/m     A/P: - Orders reviewed. Labs for AM ordered, which was adjusted as needed.   [name], MD 04/09/2022, 3:10 AM PGY-3, [place] [facility] Night Resident  Please page [phone] with questions.

## 2022-04-09 NOTE — Progress Notes (Signed)
FPTS Brief Progress Note  S: Received page from rapid response nurse, that patient is in SVT to 140s with complaints of chest pain but was talking and following commands.  Went bedside to see patient with Dr. Chauncey Reading. Patient stable complaining of chest pain and sick/jittery feeling.  Patient attempted carotid massage for vagal maneuvers with no success however did have resolution of SVT by placing face and ice bath.  Patient reporting improved chest pain and cyclic symptoms with heart rate improving to 110's.   O: BP (!) 157/107   Pulse (!) 140   Temp 98.4 F (36.9 C) (Oral)   Resp 17   Ht '5\' 4"'$  (1.626 m)   Wt 71.7 kg   LMP 06/27/2014   SpO2 100%   BMI 27.13 kg/m   General: Alert and conversant, feeling sick, anxious Resp: CTABL, normal work of breathing Card: Tachycardia, NRMG  A/P: SVT Patient with 30 minute episode of SVT, and symptomatic chest pain, and anxiety. Patient converted out of SVT to HR 110's via face in ice bath. Will continue to monitor patient closely and repeat vagal maneuvers as necessary. Troponin's were orderd and will be followed up -For repeat episode  -Face > Ice bath  -Consider Adenosine  -EKG -F/u Troponins - Orders reviewed. Labs for AM ordered, which was adjusted as needed.   Holley Bouche, MD 04/09/2022, 9:38 PM PGY-1, [place] Night Resident  Please page [phone] with questions.

## 2022-04-10 ENCOUNTER — Inpatient Hospital Stay (HOSPITAL_COMMUNITY): Payer: Medicare Other

## 2022-04-10 DIAGNOSIS — R011 Cardiac murmur, unspecified: Secondary | ICD-10-CM

## 2022-04-10 DIAGNOSIS — I471 Supraventricular tachycardia: Secondary | ICD-10-CM

## 2022-04-10 LAB — ECHOCARDIOGRAM COMPLETE
AR max vel: 1.49 cm2
AV Area VTI: 1.52 cm2
AV Area mean vel: 1.53 cm2
AV Mean grad: 11 mmHg
AV Peak grad: 20.6 mmHg
Ao pk vel: 2.27 m/s
Area-P 1/2: 3.89 cm2
Height: 64 in
S' Lateral: 3 cm
Weight: 2529.12 oz

## 2022-04-10 LAB — RENAL FUNCTION PANEL
Albumin: 3.4 g/dL — ABNORMAL LOW (ref 3.5–5.0)
Anion gap: 9 (ref 5–15)
BUN: 18 mg/dL (ref 6–20)
CO2: 29 mmol/L (ref 22–32)
Calcium: 9.2 mg/dL (ref 8.9–10.3)
Chloride: 100 mmol/L (ref 98–111)
Creatinine, Ser: 4.79 mg/dL — ABNORMAL HIGH (ref 0.44–1.00)
GFR, Estimated: 11 mL/min — ABNORMAL LOW (ref >60)
Glucose, Bld: 140 mg/dL — ABNORMAL HIGH (ref 70–99)
Phosphorus: 3.9 mg/dL (ref 2.5–4.6)
Potassium: 3.4 mmol/L — ABNORMAL LOW (ref 3.5–5.1)
Sodium: 138 mmol/L (ref 135–145)

## 2022-04-10 LAB — CBC
HCT: 31.8 % — ABNORMAL LOW (ref 36.0–46.0)
Hemoglobin: 9.7 g/dL — ABNORMAL LOW (ref 12.0–15.0)
MCH: 31.1 pg (ref 26.0–34.0)
MCHC: 30.5 g/dL (ref 30.0–36.0)
MCV: 101.9 fL — ABNORMAL HIGH (ref 80.0–100.0)
Platelets: 215 10*3/uL (ref 150–400)
RBC: 3.12 MIL/uL — ABNORMAL LOW (ref 3.87–5.11)
RDW: 17.4 % — ABNORMAL HIGH (ref 11.5–15.5)
WBC: 5.7 10*3/uL (ref 4.0–10.5)
nRBC: 0 % (ref 0.0–0.2)

## 2022-04-10 MED ORDER — POTASSIUM CHLORIDE 20 MEQ PO PACK
40.0000 meq | PACK | Freq: Once | ORAL | Status: AC
  Administered 2022-04-10: 40 meq via ORAL
  Filled 2022-04-10: qty 2

## 2022-04-10 NOTE — Progress Notes (Signed)
Patient ID: [name], female   DOB: [birth year], 51 y.o.   MRN: [id number] [facility] Progress Note   Assessment/ Plan:   1.  Hyperkalemia: Secondary to missed outpatient dialysis treatments due to various reasons and has been corrected with hemodialysis.  Labs in fact today show mild hypokalemia which I anticipate to correct with diet/reequilibration. 2. ESRD: Usually on a Monday/Wednesday/Friday dialysis schedule and appears to have missed at least a week of dialysis treatments prior to admission to the hospital.  She underwent 2 dialysis treatments over the last 2 days with significant improvement of BUN/creatinine and electrolyte abnormalities as well as volume status.  No indication for dialysis today-next treatment will be on 6/12 if she is still here in the hospital otherwise can go to her scheduled treatment at her outpatient dialysis unit. 3. Anemia: Hemoglobin and hematocrit are low without overt blood loss, continue ESA. 4. CKD-MBD: Calcium levels currently at goal with phosphorus level within therapeutic range with ongoing binders. 5. Nutrition: Continue renal diet with fluid restriction, oral nutritional supplementations as indicated. 6. Hypertension: Blood pressures noted to be significantly better and now within normal range status post hemodialysis/resumption of antihypertensive agents.  Subjective:   With chest discomfort overnight and SVT noted by nursing staff.   Objective:   BP 120/81 (BP Location: Right Arm)   Pulse 91   Temp 97.8 F (36.6 C) (Oral)   Resp 17   Ht '5\' 4"'$  (1.626 m)   Wt 71.7 kg   LMP 06/27/2014   SpO2 95%   BMI 27.13 kg/m   Physical Exam: Gen: Appears comfortable ambulating in room CVS: Pulse regular rhythm, normal rate, S1 and S2 normal Resp: Clear to auscultation, no rales/rhonchi Abd: Soft, obese, nontender, bowel sounds normal Ext: No lower extremity edema, left upper arm AV fistula with intact  dressing  Labs: BMET Recent Labs  Lab 04/08/22 0934 04/08/22 1003 04/08/22 1238 04/08/22 1700 04/09/22 0440 04/10/22 0220  NA 139 133*  --   --  138 138  K 6.7* 6.3* 7.1* 6.7* 4.2 3.4*  CL 95* 99  --   --  95* 100  CO2 17*  --   --   --  28 29  GLUCOSE 132* 128*  --   --  92 140*  BUN 103* 111*  --   --  36* 18  CREATININE 15.46* 17.70*  --   --  7.85* 4.79*  CALCIUM 9.9  --   --   --  9.8 9.2  PHOS  --   --   --   --  6.3* 3.9   CBC Recent Labs  Lab 04/08/22 0934 04/08/22 1003 04/09/22 0440 04/10/22 0220  WBC 6.8  --  4.7 5.7  NEUTROABS 5.1  --   --   --   HGB 8.6* 8.8* 9.1* 9.7*  HCT 27.5* 26.0* 29.1* 31.8*  MCV 101.5*  --  100.7* 101.9*  PLT 206  --  198 215      Medications:     ALPRAZolam  1 mg Oral TID   amLODipine  10 mg Oral QHS   busPIRone  10 mg Oral TID   calcitRIOL  1.25 mcg Oral Q M,W,F   carvedilol  12.5 mg Oral BID   Chlorhexidine Gluconate Cloth  6 each Topical Q0600   Chlorhexidine Gluconate Cloth  6 each Topical Q0600   cinacalcet  120 mg Oral Q M,W,F   citalopram  20 mg Oral Daily   DULoxetine  20 mg Oral Daily   furosemide  160 mg Oral Daily   heparin injection (subcutaneous)  5,000 Units Subcutaneous Q8H   hydrALAZINE  75 mg Oral TID   HYDROmorphone  2 mg Oral Q6H   lanthanum  1,000 mg Oral TID WC   lisinopril  40 mg Oral Daily   melatonin  5 mg Oral QHS   methocarbamol  500 mg Oral BID   mometasone-formoterol  2 puff Inhalation BID   montelukast  10 mg Oral QHS   multivitamin  1 tablet Oral Daily   ondansetron  4 mg Oral Daily   potassium chloride  40 mEq Oral Once   Elmarie Shiley, MD 04/10/2022, 8:53 AM

## 2022-04-10 NOTE — Assessment & Plan Note (Signed)
Holosystolic III/VI murmur heard this morning, not appreciated last night.  - Echo

## 2022-04-10 NOTE — Assessment & Plan Note (Signed)
Patient had 30 minute episode of SVT to 140s-150s from 8:57 to 9:30 PM last night. She has never had an episode of tachycardia like this. Terminated with ice water bath to face. Troponin negative at 12. Chest pain improved when SVT terminated. - Continue cardiac monitor - Use facial ice bath if SVT recurs - [facility] Med team [phone]

## 2022-04-10 NOTE — Progress Notes (Signed)
Called to room pt c/o chest discomfort-found pt in sustained SVT-rate 140's-symptomatic-with upper right chest pain- radiating to neck-states not feeling well-Notified Rapid response nurse, as well as on call for family medicine. Had pt attempt valsalva maneuver- was unsuccsessful- see other notes

## 2022-04-10 NOTE — Progress Notes (Signed)
Daily Progress Note [facility] Pager: [phone]  Patient name: [name] Medical record number: [id number] Date of birth: 1971-08-16 Age: 51 y.o. Gender: female  Primary Care Provider: Ronnell Freshwater, NP Consultants: Nephro Code Status: Partial  Pt Overview and Major Events to Date:  6/8: admitted, HD 6/9: HD, SVT episode  Assessment and Plan: [name] is a 51 year old female who presented with generalized weakness, nausea, and vomiting after multiple missed HD sessions.  Pertinent PMH/PSH includes ESRD on HD, hypertension, chronic pain, migraine headaches, anxiety.   * ESRD on dialysis [name]) Patient on M/W/F HD schedule. Had not had HD since 5/30. Has now had 2 HD sessions. Will need more sessions of HD per nephrology. - Nephrology consulted, appreciate recommendations   SVT (supraventricular tachycardia) ([name]) Patient had 30 minute episode of SVT to 140s-150s from 8:57 to 9:30 PM last night. She has never had an episode of tachycardia like this. Terminated with ice water bath to face. Troponin negative at 12. Chest pain improved when SVT terminated. - Continue cardiac monitor - Use facial ice bath if SVT recurs - Page [name] Med team [phone]  Hyperkalemia Improved electrolytes since HD, K today slightly low at 3.4 - Daily BMP  Murmur, cardiac Holosystolic III/VI murmur heard this morning, not appreciated last night.  - Echo   FEN/GI: Renal PPx: Subcu heparin Dispo: Home pending clinical improvement  tomorrow. Barriers include need for dialysis.   Subjective:  Patient slept well, she did not have any other repeat events of chest pain or SVT after the episode early in the night.  Objective: Temp:  [97.8 F (36.6 C)-98.5 F (36.9 C)] 97.8 F (36.6 C) (06/10 0336) Pulse Rate:  [85-140] 91 (06/10 0336) Resp:  [12-20] 17 (06/10 0020) BP: (107-176)/(64-114) 120/81 (06/10 0336) SpO2:  [95 %-100 %] 95 % (06/09 2220) Weight:  [71.7 kg-74 kg] 71.7  kg (06/09 1653) Physical Exam: General: Chronically ill-appearing WW, NAD Cardiovascular: RRR, new holosystolic murmur 3/6, 2+ radial pulses Respiratory: CTAB, no IWOB Abdomen: Soft, NT, ND Extremities: warm, dry, no edema  Laboratory: Most recent CBC Lab Results  Component Value Date   WBC 5.7 04/10/2022   HGB 9.7 (L) 04/10/2022   HCT 31.8 (L) 04/10/2022   MCV 101.9 (H) 04/10/2022   PLT 215 04/10/2022   Most recent BMP    Latest Ref Rng & Units 04/10/2022    2:20 AM  BMP  Glucose 70 - 99 mg/dL 140   BUN 6 - 20 mg/dL 18   Creatinine 0.44 - 1.00 mg/dL 4.79   Sodium 135 - 145 mmol/L 138   Potassium 3.5 - 5.1 mmol/L 3.4   Chloride 98 - 111 mmol/L 100   CO2 22 - 32 mmol/L 29   Calcium 8.9 - 10.3 mg/dL 9.2     Other pertinent labs trop 12   Imaging/Diagnostic Tests: DG Hand Complete Right  Result Date: 04/09/2022 CLINICAL DATA:  Hand pain EXAM: RIGHT HAND - COMPLETE 3+ VIEW COMPARISON:  None Available. FINDINGS: Limited evaluation of the index finger in the frontal view due to overlying medical device. There is no evidence of acute fracture. Alignment is normal. There is no significant arthritis. IMPRESSION: No acute osseous abnormality. Electronically Signed   By: [name] M.D.   On: 04/09/2022 19:03    Radiologist Impression: no abnormality My interpretation: no fracture Sarah Damme, MD 04/10/2022, 7:03 AM  PGY-3, [name] pager: [phone], text pages welcome Secure chat  group [name]

## 2022-04-10 NOTE — Discharge Instructions (Signed)
Dear [name],   Thank you so much for allowing [place] to be part of your care!  You were admitted to [facility] due to having missed several sessions of dialysis. Your had 2 sessions while you were here and your numbers improved during that time. You also had an ultrasound of your heart due to a murmur being heard, there was no acute concerns on the echocardiogram.    POST-HOSPITAL & CARE INSTRUCTIONS Please make sure to follow-up with your next dialysis session on 6/12. Please let PCP/Specialists know of any changes that were made.  Please see medications section of this packet for any medication changes.   DOCTOR'S APPOINTMENT & FOLLOW UP CARE INSTRUCTIONS  No future appointments.  RETURN PRECAUTIONS: Shortness of breath or chest pain with exertion  Take care and be well!  [facility]  [place], [place] [address] [phone]

## 2022-04-10 NOTE — Progress Notes (Signed)
   04/09/22 2112  Assess: MEWS Score  BP (!) 157/107  MAP (mmHg) 123  Pulse Rate (!) 140  SpO2 100 %  Assess: MEWS Score  MEWS Temp 0  MEWS Systolic 0  MEWS Pulse 3  MEWS RR 0  MEWS LOC 0  MEWS Score 3  MEWS Score Color Yellow  Assess: if the MEWS score is Yellow or Red  Were vital signs taken at a resting state? Yes  Focused Assessment Change from prior assessment (see assessment flowsheet)  Does the patient meet 2 or more of the SIRS criteria? No  MEWS guidelines implemented *See Row Information* Yes  Treat  MEWS Interventions Administered prn meds/treatments;Administered scheduled meds/treatments  Pain Scale 0-10  Pain Score 7  Pain Type Acute pain  Pain Location Chest  Pain Orientation Right;Upper  Pain Radiating Towards neck  Pain Intervention(s) Cold applied  Take Vital Signs  Increase Vital Sign Frequency  Yellow: Q 2hr X 2 then Q 4hr X 2, if remains yellow, continue Q 4hrs  Notify: Provider  Provider Name/Title dr Chauncey Reading  Date Provider Notified 04/09/22  Time Provider Notified 2000  Method of Notification Page  Notification Reason Change in status  Notify: Rapid Response  Name of Rapid Response RN Notified Corine  Date Rapid Response Notified 04/09/22  Time Rapid Response Notified 2000  Document  Patient Outcome Stabilized after interventions  Progress note created (see row info) Yes  Assess: SIRS CRITERIA  SIRS Temperature  0  SIRS Pulse 1  SIRS Respirations  0  SIRS WBC 0  SIRS Score Sum  1

## 2022-04-10 NOTE — Progress Notes (Signed)
Rapid Response Event Note   Reason for Call :  Pt in sustained SVT at 140   Initial Focused Assessment:  PT A&O x 4, c/o racing heart and neck pain and diaphoretic. SpO2 100% RA, HR 140 SVT, RR 21, BP 157/107.    Interventions:  EKG -SVT Family Medicine to bedside Considered adenosine Face Ice bath- HR NSR 110   Plan of Care:  Face ice baths as needed  if SVT reoccurs. Call RRT RN if needed.   Devota Pace, RN

## 2022-04-10 NOTE — Progress Notes (Signed)
Discharged home- pt removed telemetry, and iv when she was informed of consent by family medicine for discharge- order written- discharge instructions printed and reviewed with teach back verification of understanding- no new scripts. Pt aware  to make appointment and follow up with dialysis

## 2022-04-10 NOTE — Discharge Summary (Signed)
[facility] Discharge Summary  Patient name: [name] Medical record number: [id number] Date of birth: [birth year] Age: 51 y.o. Gender: female Date of Admission: 04/08/2022  Date of Discharge: 04/10/2022 Admitting Physician: Sarah Ohara McDiarmid, MD  Primary Care Provider: Ronnell Freshwater, NP Consultants: Nephrology  Indication for Hospitalization: Generalized weakness  Brief Hospital Course:  [name] is a 51 y.o. female that presented with generalized weakness, nausea, and vomiting after multiple missed HD sessions. PMH significant for ESRD on HD, HTN, anemia, thrombocytopenia, MDD, migraines, GAD, mild intermittent asthma, chronic pain syndrome.   ESRD on HD Hyperkalemia Patient missed several dialysis sessions prior to admission.  Nephrology was consulted and patient underwent 2 subsequent HD sessions with significant improvements in symptoms (weakness, nausea, vomiting).  Patient was instructed to continue with outpatient sessions as scheduled on Mondays, Wednesdays, Fridays.  SVT During admission, patient noted with 30-minute episode of symptomatic SVT that resolved after facial ice bath.  Troponins were negative at that time.  Echocardiogram obtained was negative for obvious structural heart disease.  No further episodes occurred during hospitalization.  Murmur Murmur noted by rounding physician.  Echocardiogram showed mildly elevated velocity across aortic valve without obvious etiology, left atrium was mildly dilated but no other structural abnormalities.  Patient discharged without further work-up.   All chronic conditions and home medications were continued as appropriate.  Please see H&P for complete list.  Discharge Diagnoses/Problem List:  Principal Problem:   ESRD on dialysis [facility]) Active Problems:   Murmur, cardiac   Hyperkalemia   PSVT (paroxysmal supraventricular tachycardia) (HCC)  Disposition:  Home  Discharge Condition: Stable, improved  Discharge Exam: Completed by Dr. Chauncey Barnett on day of admission General: Chronically ill-appearing [name], NAD Cardiovascular: RRR, new holosystolic murmur 3/6, 2+ radial pulses Respiratory: CTAB, no IWOB Abdomen: Soft, NT, ND Extremities: warm, dry, no edema  Issues for Follow Up:  Continue outpatient dialysis sessions as scheduled Monitor heart murmur, especially if symptomatic  Recommend decreasing patient's sedating medications as able.   Significant Procedures: Dialysis session x2  Significant Labs and Imaging:  Recent Labs  Lab 04/09/22 0440 04/10/22 0220  WBC 4.7 5.7  HGB 9.1* 9.7*  HCT 29.1* 31.8*  PLT 198 215   Recent Labs  Lab 04/09/22 0440 04/10/22 0220  NA 138 138  K 4.2 3.4*  CL 95* 100  CO2 28 29  GLUCOSE 92 140*  BUN 36* 18  CREATININE 7.85* 4.79*  CALCIUM 9.8 9.2  PHOS 6.3* 3.9  ALBUMIN 3.5 3.4*    ECHOCARDIOGRAM COMPLETE  Result Date: 04/10/2022 ECHOCARDIOGRAM REPORT   IMPRESSIONS  1. Mildly levated velocity across aortic valve (2.3 m/s) of uncertain etiology; aortic valve appears to open well.  2. Left ventricular ejection fraction, by estimation, is 60 to 65%. The left ventricle has normal function. The left ventricle has no regional wall motion abnormalities. Left ventricular diastolic parameters were normal.  3. Right ventricular systolic function is normal. The right ventricular size is normal.  4. Left atrial size was mildly dilated.  5. The mitral valve is normal in structure. No evidence of mitral valve regurgitation. No evidence of mitral stenosis.  6. The aortic valve is normal in structure. Aortic valve regurgitation is not visualized. No aortic stenosis is present.  7. The inferior vena cava is normal in size with greater than 50% respiratory variability, suggesting right atrial pressure of 3 mmHg.  Electronically signed by Kirk Ruths MD Signature Date/Time: 04/10/2022/6:48:52 PM  Final    DG Hand  Complete Right Result Date: 04/09/2022 IMPRESSION: No acute osseous abnormality. Electronically Signed   By: [name] M.D.   On: 04/09/2022 19:03   DG Chest 2 View IMPRESSION: 1. Cardiomegaly with mild pulmonary vascular congestion. Electronically Signed   By: Kathreen Devoid M.D.   On: 04/08/2022 09:47     Results/Tests Pending at Time of Discharge: None  Discharge Medications:  Allergies as of 04/10/2022       Reactions   Clindamycin    Other reaction(s): Hives   Nsaids Other (See Comments)   Renal failure   Tolmetin Other (See Comments)   Renal failure   Vancomycin Hives, Other (See Comments), Anaphylaxis   ? 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 Other reaction(s): Hives, Other (See Comments) ? 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   Clarithromycin Nausea And Vomiting   Clindamycin/lincomycin Hives, Rash   Demerol Nausea And Vomiting, Other (See Comments)   Severe headaches   Fentanyl Hives, Nausea And Vomiting, Rash, Other (See Comments)   Reaction to patches only Other reaction(s): Unknown Reaction to patches only Fentanyl patch   Other Other (See Comments), Nausea And Vomiting, Rash   Seeds and nuts- has diverticulitis  Other reaction(s): Other (See Comments) Severe headache, Pt does not want it!   Honey Bee Venom Swelling   Clarithromycin Nausea And Vomiting   Morphine And Related Nausea And Vomiting, Other (See Comments)   Severe headache, Pt does not want it!        Medication List     TAKE these medications    acetaminophen 325 MG tablet Commonly known as: TYLENOL Take 2 tablets (650 mg total) by mouth every 8 (eight) hours as needed for mild pain, fever or headache.   albuterol 108 (90 Base) MCG/ACT inhaler Commonly known as: VENTOLIN HFA Inhale 2 puffs into the lungs every 6 (six) hours as needed for shortness of breath.   ALPRAZolam 1 MG tablet Commonly known as: XANAX Take 1 tablet (1 mg total) by mouth 3 (three) times daily.   amLODipine 10 MG tablet Commonly known as:  NORVASC Take 10 mg by mouth at bedtime.   budesonide-formoterol 160-4.5 MCG/ACT inhaler Commonly known as: SYMBICORT Inhale 2 puffs into the lungs 2 (two) times daily.   busPIRone 10 MG tablet Commonly known as: BUSPAR TAKE 1 TABLET BY MOUTH THREE TIMES A DAY   carvedilol 12.5 MG tablet Commonly known as: COREG Take 12.5 mg by mouth 2 (two) times daily.   citalopram 20 MG tablet Commonly known as: CELEXA Take 20 mg by mouth daily.   cyclobenzaprine 10 MG tablet Commonly known as: FLEXERIL Take 20 mg by mouth 2 (two) times daily as needed for muscle spasms (takes at bedtime).   diphenhydrAMINE 25 mg capsule Commonly known as: BENADRYL Take 25 mg by mouth every 6 (six) hours as needed for itching or allergies.   DULoxetine 20 MG capsule Commonly known as: CYMBALTA TAKE 1 CAPSULE BY MOUTH EVERY DAY What changed: how much to take   Fosrenol 1000 MG Pack Generic drug: Lanthanum Carbonate Take 1,000 mg by mouth daily after breakfast. Mix with applesauce   furosemide 80 MG tablet Commonly known as: LASIX Take 160 mg by mouth daily.   hydrALAZINE 50 MG tablet Commonly known as: APRESOLINE Take 75 mg by mouth 3 (three) times daily.   HYDROmorphone 2 MG tablet Commonly known as: DILAUDID Take 2 mg by mouth in the morning, at noon, in the evening, and at bedtime.   hydrOXYzine  25 MG capsule Commonly known as: VISTARIL Take 25 mg by mouth 3 (three) times daily.   lidocaine-prilocaine cream Commonly known as: EMLA Apply 1 application topically See admin instructions. Apply topically one hour prior to dialysis - Monday, Wednesday, Friday   lisinopril 40 MG tablet Commonly known as: ZESTRIL Take 40 mg by mouth daily.   melatonin 5 MG Tabs Take 5 mg by mouth at bedtime.   methocarbamol 500 MG tablet Commonly known as: ROBAXIN Take 500 mg by mouth 2 (two) times daily.   montelukast 10 MG tablet Commonly known as: SINGULAIR Take 1 tablet (10 mg total) by mouth  daily.   multivitamin Tabs tablet Take 1 tablet by mouth daily.   mupirocin ointment 2 % Commonly known as: BACTROBAN Apply te effected areas twice daily What changed:  how much to take how to take this when to take this reasons to take this additional instructions   naloxone 4 MG/0.1ML Liqd nasal spray kit Commonly known as: NARCAN Place 1 spray into the nose daily as needed (overdose).   ondansetron 4 MG disintegrating tablet Commonly known as: ZOFRAN-ODT Take 4 mg by mouth every 8 (eight) hours as needed for nausea/vomiting.   ondansetron 4 MG tablet Commonly known as: ZOFRAN Take 4 mg by mouth daily.   promethazine 25 MG tablet Commonly known as: PHENERGAN Take 1 tablet (25 mg total) by mouth 2 (two) times daily as needed for nausea or vomiting.   sevelamer carbonate 800 MG tablet Commonly known as: RENVELA Take 1,600-2,400 mg by mouth daily as needed (when eating). Take 3 tablets with meals and 2 tablets with snacks   SUMAtriptan 50 MG tablet Commonly known as: IMITREX Take 1 tablet (50 mg total) by mouth daily as needed for migraine or headache.        Discharge Instructions: Please refer to Patient Instructions section of EMR for full details.  Patient was counseled important signs and symptoms that should prompt return to medical care, changes in medications, dietary instructions, activity restrictions, and follow up appointments.   Follow-Up Appointments: Follow-up with PCP in 1 week Outpatient HD session on 6/12  Rise Patience, DO 04/10/2022, 9:05 PM PGY-2, [place]

## 2022-04-10 NOTE — Hospital Course (Signed)
[name] is a 51 y.o. female that presented with generalized weakness, nausea, and vomiting after multiple missed HD sessions. PMH significant for ESRD on HD, HTN, anemia, thrombocytopenia, MDD, migraines, GAD, mild intermittent asthma, chronic pain syndrome.   ESRD on HD Hyperkalemia Patient missed several dialysis sessions prior to admission.  Nephrology was consulted and patient underwent 2 subsequent HD sessions with significant improvements in symptoms (weakness, nausea, vomiting).  Patient was instructed to continue with outpatient sessions as scheduled on Mondays, Wednesdays, Fridays.  SVT During admission, patient noted with 30-minute episode of symptomatic SVT that resolved after facial ice bath.  Troponins were negative at that time.  Echocardiogram obtained was negative for obvious structural heart disease.  No further episodes occurred during hospitalization.  Murmur Murmur noted by rounding physician.  Echocardiogram showed mildly elevated velocity across aortic valve without obvious etiology, left atrium was mildly dilated but no other structural abnormalities.  Patient discharged without further work-up.   All chronic conditions and home medications were continued as appropriate.  Please see H&P for complete list.

## 2022-04-11 NOTE — TOC Transition Note (Signed)
Transition of care contact from inpatient facility  Date of discharge: 04/10/22 Date of contact: 04/11/22 Method: Phone Spoke to: Patient  Patient contacted to discuss transition of care from recent inpatient hospitalization. Patient was admitted to [facility] from 04/08/22-04/10/22 with discharge diagnosis of generalized weakness, N/V, hyperkalemia: 2nd missed HD.   Medication changes were reviewed.  Patient will follow up with her outpatient HD unit on: Monday June 12 at [facility]  Tobie Poet, NP

## 2022-04-12 DIAGNOSIS — N2581 Secondary hyperparathyroidism of renal origin: Secondary | ICD-10-CM | POA: Diagnosis not present

## 2022-04-12 DIAGNOSIS — R52 Pain, unspecified: Secondary | ICD-10-CM | POA: Diagnosis not present

## 2022-04-12 DIAGNOSIS — D631 Anemia in chronic kidney disease: Secondary | ICD-10-CM | POA: Diagnosis not present

## 2022-04-12 DIAGNOSIS — N186 End stage renal disease: Secondary | ICD-10-CM | POA: Diagnosis not present

## 2022-04-12 DIAGNOSIS — Z992 Dependence on renal dialysis: Secondary | ICD-10-CM | POA: Diagnosis not present

## 2022-04-12 DIAGNOSIS — Z23 Encounter for immunization: Secondary | ICD-10-CM | POA: Diagnosis not present

## 2022-04-12 DIAGNOSIS — L299 Pruritus, unspecified: Secondary | ICD-10-CM | POA: Diagnosis not present

## 2022-04-12 DIAGNOSIS — R519 Headache, unspecified: Secondary | ICD-10-CM | POA: Diagnosis not present

## 2022-04-12 DIAGNOSIS — E875 Hyperkalemia: Secondary | ICD-10-CM | POA: Diagnosis not present

## 2022-04-13 ENCOUNTER — Telehealth: Payer: Self-pay

## 2022-04-13 DIAGNOSIS — M25561 Pain in right knee: Secondary | ICD-10-CM | POA: Diagnosis not present

## 2022-04-13 DIAGNOSIS — M5418 Radiculopathy, sacral and sacrococcygeal region: Secondary | ICD-10-CM | POA: Diagnosis not present

## 2022-04-13 DIAGNOSIS — G8929 Other chronic pain: Secondary | ICD-10-CM | POA: Diagnosis not present

## 2022-04-13 DIAGNOSIS — M549 Dorsalgia, unspecified: Secondary | ICD-10-CM | POA: Diagnosis not present

## 2022-04-13 DIAGNOSIS — M5417 Radiculopathy, lumbosacral region: Secondary | ICD-10-CM | POA: Diagnosis not present

## 2022-04-13 DIAGNOSIS — M25511 Pain in right shoulder: Secondary | ICD-10-CM | POA: Diagnosis not present

## 2022-04-13 NOTE — Telephone Encounter (Signed)
Transition Care Management Follow-up Telephone Call Date of discharge and from where: 6/10 [name] How have you been since you were released from the hospital? She state she I feeling ok. Any questions or concerns? No  Items Reviewed: Did the pt receive and understand the discharge instructions provided? Yes  Medications obtained and verified? Yes  Other? No  Any new allergies since your discharge? No  Dietary orders reviewed? NO Do you have support at home? Yes   Home Care and Equipment/Supplies: Were home health services ordered? no If so, what is the name of the agency? N/A  Has the agency set up a time to come to the patient's home? not applicable Were any new equipment or medical supplies ordered?  No What is the name of the medical supply agency? N/a Were you able to get the supplies/equipment? not applicable Do you have any questions related to the use of the equipment or supplies? No  Functional Questionnaire: (I = Independent and D = Dependent) ADLs: I  Bathing/Dressing- I  Meal Prep- I  Eating- I  Maintaining continence- I  Transferring/Ambulation- I  Managing Meds- I  Follow up appointments reviewed:  PCP Hospital f/u appt confirmed?  The patient said that she will call to schedule the appointment   Scheduled to see N/a on N/a @ N/a. [facility] f/u appt confirmed?  Thee patient was seen at Dialysis   Scheduled to see N/a on N/a @ N/a. Are transportation arrangements needed? No  If their condition worsens, is the pt aware to call PCP or go to the Emergency Dept.? Yes Was the patient provided with contact information for the PCP's office or ED? Yes Was to pt encouraged to call back with questions or concerns? Yes  Lazaro Arms RN, BSN, [facility] Care Management Coordinator Transition of Care  Phone: [phone]

## 2022-04-19 DIAGNOSIS — Z23 Encounter for immunization: Secondary | ICD-10-CM | POA: Diagnosis not present

## 2022-04-19 DIAGNOSIS — R519 Headache, unspecified: Secondary | ICD-10-CM | POA: Diagnosis not present

## 2022-04-19 DIAGNOSIS — N186 End stage renal disease: Secondary | ICD-10-CM | POA: Diagnosis not present

## 2022-04-19 DIAGNOSIS — R52 Pain, unspecified: Secondary | ICD-10-CM | POA: Diagnosis not present

## 2022-04-19 DIAGNOSIS — D631 Anemia in chronic kidney disease: Secondary | ICD-10-CM | POA: Diagnosis not present

## 2022-04-19 DIAGNOSIS — Z992 Dependence on renal dialysis: Secondary | ICD-10-CM | POA: Diagnosis not present

## 2022-04-19 DIAGNOSIS — L299 Pruritus, unspecified: Secondary | ICD-10-CM | POA: Diagnosis not present

## 2022-04-19 DIAGNOSIS — N2581 Secondary hyperparathyroidism of renal origin: Secondary | ICD-10-CM | POA: Diagnosis not present

## 2022-04-22 DIAGNOSIS — N186 End stage renal disease: Secondary | ICD-10-CM | POA: Diagnosis not present

## 2022-04-22 DIAGNOSIS — Z992 Dependence on renal dialysis: Secondary | ICD-10-CM | POA: Diagnosis not present

## 2022-04-22 DIAGNOSIS — R519 Headache, unspecified: Secondary | ICD-10-CM | POA: Diagnosis not present

## 2022-04-22 DIAGNOSIS — L299 Pruritus, unspecified: Secondary | ICD-10-CM | POA: Diagnosis not present

## 2022-04-22 DIAGNOSIS — N2581 Secondary hyperparathyroidism of renal origin: Secondary | ICD-10-CM | POA: Diagnosis not present

## 2022-04-22 DIAGNOSIS — R52 Pain, unspecified: Secondary | ICD-10-CM | POA: Diagnosis not present

## 2022-04-22 DIAGNOSIS — Z23 Encounter for immunization: Secondary | ICD-10-CM | POA: Diagnosis not present

## 2022-04-22 DIAGNOSIS — D631 Anemia in chronic kidney disease: Secondary | ICD-10-CM | POA: Diagnosis not present

## 2022-04-23 DIAGNOSIS — R519 Headache, unspecified: Secondary | ICD-10-CM | POA: Diagnosis not present

## 2022-04-23 DIAGNOSIS — D631 Anemia in chronic kidney disease: Secondary | ICD-10-CM | POA: Diagnosis not present

## 2022-04-23 DIAGNOSIS — Z992 Dependence on renal dialysis: Secondary | ICD-10-CM | POA: Diagnosis not present

## 2022-04-23 DIAGNOSIS — N186 End stage renal disease: Secondary | ICD-10-CM | POA: Diagnosis not present

## 2022-04-23 DIAGNOSIS — N2581 Secondary hyperparathyroidism of renal origin: Secondary | ICD-10-CM | POA: Diagnosis not present

## 2022-04-23 DIAGNOSIS — R52 Pain, unspecified: Secondary | ICD-10-CM | POA: Diagnosis not present

## 2022-04-23 DIAGNOSIS — Z23 Encounter for immunization: Secondary | ICD-10-CM | POA: Diagnosis not present

## 2022-04-23 DIAGNOSIS — L299 Pruritus, unspecified: Secondary | ICD-10-CM | POA: Diagnosis not present

## 2022-04-27 DIAGNOSIS — N2581 Secondary hyperparathyroidism of renal origin: Secondary | ICD-10-CM | POA: Diagnosis not present

## 2022-04-27 DIAGNOSIS — Z23 Encounter for immunization: Secondary | ICD-10-CM | POA: Diagnosis not present

## 2022-04-27 DIAGNOSIS — L299 Pruritus, unspecified: Secondary | ICD-10-CM | POA: Diagnosis not present

## 2022-04-27 DIAGNOSIS — R519 Headache, unspecified: Secondary | ICD-10-CM | POA: Diagnosis not present

## 2022-04-27 DIAGNOSIS — Z992 Dependence on renal dialysis: Secondary | ICD-10-CM | POA: Diagnosis not present

## 2022-04-27 DIAGNOSIS — N186 End stage renal disease: Secondary | ICD-10-CM | POA: Diagnosis not present

## 2022-04-27 DIAGNOSIS — D631 Anemia in chronic kidney disease: Secondary | ICD-10-CM | POA: Diagnosis not present

## 2022-04-27 DIAGNOSIS — R52 Pain, unspecified: Secondary | ICD-10-CM | POA: Diagnosis not present

## 2022-04-28 DIAGNOSIS — R519 Headache, unspecified: Secondary | ICD-10-CM | POA: Diagnosis not present

## 2022-04-28 DIAGNOSIS — N186 End stage renal disease: Secondary | ICD-10-CM | POA: Diagnosis not present

## 2022-04-28 DIAGNOSIS — D631 Anemia in chronic kidney disease: Secondary | ICD-10-CM | POA: Diagnosis not present

## 2022-04-28 DIAGNOSIS — L299 Pruritus, unspecified: Secondary | ICD-10-CM | POA: Diagnosis not present

## 2022-04-28 DIAGNOSIS — N2581 Secondary hyperparathyroidism of renal origin: Secondary | ICD-10-CM | POA: Diagnosis not present

## 2022-04-28 DIAGNOSIS — Z992 Dependence on renal dialysis: Secondary | ICD-10-CM | POA: Diagnosis not present

## 2022-04-28 DIAGNOSIS — R52 Pain, unspecified: Secondary | ICD-10-CM | POA: Diagnosis not present

## 2022-04-28 DIAGNOSIS — Z23 Encounter for immunization: Secondary | ICD-10-CM | POA: Diagnosis not present

## 2022-04-30 DIAGNOSIS — N186 End stage renal disease: Secondary | ICD-10-CM | POA: Diagnosis not present

## 2022-04-30 DIAGNOSIS — Z992 Dependence on renal dialysis: Secondary | ICD-10-CM | POA: Diagnosis not present

## 2022-04-30 DIAGNOSIS — N033 Chronic nephritic syndrome with diffuse mesangial proliferative glomerulonephritis: Secondary | ICD-10-CM | POA: Diagnosis not present

## 2022-05-04 DIAGNOSIS — N2581 Secondary hyperparathyroidism of renal origin: Secondary | ICD-10-CM | POA: Diagnosis not present

## 2022-05-04 DIAGNOSIS — N186 End stage renal disease: Secondary | ICD-10-CM | POA: Diagnosis not present

## 2022-05-04 DIAGNOSIS — Z992 Dependence on renal dialysis: Secondary | ICD-10-CM | POA: Diagnosis not present

## 2022-05-04 DIAGNOSIS — L299 Pruritus, unspecified: Secondary | ICD-10-CM | POA: Diagnosis not present

## 2022-05-04 DIAGNOSIS — E119 Type 2 diabetes mellitus without complications: Secondary | ICD-10-CM | POA: Diagnosis not present

## 2022-05-04 DIAGNOSIS — R519 Headache, unspecified: Secondary | ICD-10-CM | POA: Diagnosis not present

## 2022-05-04 DIAGNOSIS — Z23 Encounter for immunization: Secondary | ICD-10-CM | POA: Diagnosis not present

## 2022-05-04 DIAGNOSIS — R52 Pain, unspecified: Secondary | ICD-10-CM | POA: Diagnosis not present

## 2022-05-04 DIAGNOSIS — D631 Anemia in chronic kidney disease: Secondary | ICD-10-CM | POA: Diagnosis not present

## 2022-05-05 DIAGNOSIS — Z992 Dependence on renal dialysis: Secondary | ICD-10-CM | POA: Diagnosis not present

## 2022-05-05 DIAGNOSIS — L299 Pruritus, unspecified: Secondary | ICD-10-CM | POA: Diagnosis not present

## 2022-05-05 DIAGNOSIS — E119 Type 2 diabetes mellitus without complications: Secondary | ICD-10-CM | POA: Diagnosis not present

## 2022-05-05 DIAGNOSIS — R52 Pain, unspecified: Secondary | ICD-10-CM | POA: Diagnosis not present

## 2022-05-05 DIAGNOSIS — N2581 Secondary hyperparathyroidism of renal origin: Secondary | ICD-10-CM | POA: Diagnosis not present

## 2022-05-05 DIAGNOSIS — Z23 Encounter for immunization: Secondary | ICD-10-CM | POA: Diagnosis not present

## 2022-05-05 DIAGNOSIS — D631 Anemia in chronic kidney disease: Secondary | ICD-10-CM | POA: Diagnosis not present

## 2022-05-05 DIAGNOSIS — N186 End stage renal disease: Secondary | ICD-10-CM | POA: Diagnosis not present

## 2022-05-05 DIAGNOSIS — R519 Headache, unspecified: Secondary | ICD-10-CM | POA: Diagnosis not present

## 2022-05-06 ENCOUNTER — Other Ambulatory Visit: Payer: Self-pay | Admitting: Nurse Practitioner

## 2022-05-06 DIAGNOSIS — F411 Generalized anxiety disorder: Secondary | ICD-10-CM

## 2022-05-10 DIAGNOSIS — E119 Type 2 diabetes mellitus without complications: Secondary | ICD-10-CM | POA: Diagnosis not present

## 2022-05-10 DIAGNOSIS — R52 Pain, unspecified: Secondary | ICD-10-CM | POA: Diagnosis not present

## 2022-05-10 DIAGNOSIS — N2581 Secondary hyperparathyroidism of renal origin: Secondary | ICD-10-CM | POA: Diagnosis not present

## 2022-05-10 DIAGNOSIS — D631 Anemia in chronic kidney disease: Secondary | ICD-10-CM | POA: Diagnosis not present

## 2022-05-10 DIAGNOSIS — N186 End stage renal disease: Secondary | ICD-10-CM | POA: Diagnosis not present

## 2022-05-10 DIAGNOSIS — Z992 Dependence on renal dialysis: Secondary | ICD-10-CM | POA: Diagnosis not present

## 2022-05-10 DIAGNOSIS — Z23 Encounter for immunization: Secondary | ICD-10-CM | POA: Diagnosis not present

## 2022-05-10 DIAGNOSIS — R519 Headache, unspecified: Secondary | ICD-10-CM | POA: Diagnosis not present

## 2022-05-10 DIAGNOSIS — L299 Pruritus, unspecified: Secondary | ICD-10-CM | POA: Diagnosis not present

## 2022-05-11 ENCOUNTER — Telehealth: Payer: Self-pay | Admitting: Nurse Practitioner

## 2022-05-11 NOTE — Telephone Encounter (Signed)
Patient request refill of Xanax. Please advise.

## 2022-05-11 NOTE — Telephone Encounter (Signed)
She is going to have to be seen for this. She waws supposed to follow up in 03/2022.  We were talking about weaning down the dosage and she needs to sign a controlled substances agreement policy.

## 2022-05-12 DIAGNOSIS — E119 Type 2 diabetes mellitus without complications: Secondary | ICD-10-CM | POA: Diagnosis not present

## 2022-05-12 DIAGNOSIS — D631 Anemia in chronic kidney disease: Secondary | ICD-10-CM | POA: Diagnosis not present

## 2022-05-12 DIAGNOSIS — L299 Pruritus, unspecified: Secondary | ICD-10-CM | POA: Diagnosis not present

## 2022-05-12 DIAGNOSIS — R52 Pain, unspecified: Secondary | ICD-10-CM | POA: Diagnosis not present

## 2022-05-12 DIAGNOSIS — N2581 Secondary hyperparathyroidism of renal origin: Secondary | ICD-10-CM | POA: Diagnosis not present

## 2022-05-12 DIAGNOSIS — N186 End stage renal disease: Secondary | ICD-10-CM | POA: Diagnosis not present

## 2022-05-12 DIAGNOSIS — Z23 Encounter for immunization: Secondary | ICD-10-CM | POA: Diagnosis not present

## 2022-05-12 DIAGNOSIS — R519 Headache, unspecified: Secondary | ICD-10-CM | POA: Diagnosis not present

## 2022-05-12 DIAGNOSIS — Z992 Dependence on renal dialysis: Secondary | ICD-10-CM | POA: Diagnosis not present

## 2022-05-12 NOTE — Telephone Encounter (Signed)
lmtc

## 2022-05-14 DIAGNOSIS — N2581 Secondary hyperparathyroidism of renal origin: Secondary | ICD-10-CM | POA: Diagnosis not present

## 2022-05-14 DIAGNOSIS — R519 Headache, unspecified: Secondary | ICD-10-CM | POA: Diagnosis not present

## 2022-05-14 DIAGNOSIS — R52 Pain, unspecified: Secondary | ICD-10-CM | POA: Diagnosis not present

## 2022-05-14 DIAGNOSIS — Z992 Dependence on renal dialysis: Secondary | ICD-10-CM | POA: Diagnosis not present

## 2022-05-14 DIAGNOSIS — N186 End stage renal disease: Secondary | ICD-10-CM | POA: Diagnosis not present

## 2022-05-14 DIAGNOSIS — Z23 Encounter for immunization: Secondary | ICD-10-CM | POA: Diagnosis not present

## 2022-05-14 DIAGNOSIS — E119 Type 2 diabetes mellitus without complications: Secondary | ICD-10-CM | POA: Diagnosis not present

## 2022-05-14 DIAGNOSIS — L299 Pruritus, unspecified: Secondary | ICD-10-CM | POA: Diagnosis not present

## 2022-05-14 DIAGNOSIS — D631 Anemia in chronic kidney disease: Secondary | ICD-10-CM | POA: Diagnosis not present

## 2022-05-18 DIAGNOSIS — N186 End stage renal disease: Secondary | ICD-10-CM | POA: Diagnosis not present

## 2022-05-18 DIAGNOSIS — N2581 Secondary hyperparathyroidism of renal origin: Secondary | ICD-10-CM | POA: Diagnosis not present

## 2022-05-18 DIAGNOSIS — R519 Headache, unspecified: Secondary | ICD-10-CM | POA: Diagnosis not present

## 2022-05-18 DIAGNOSIS — Z992 Dependence on renal dialysis: Secondary | ICD-10-CM | POA: Diagnosis not present

## 2022-05-18 DIAGNOSIS — Z23 Encounter for immunization: Secondary | ICD-10-CM | POA: Diagnosis not present

## 2022-05-18 DIAGNOSIS — E119 Type 2 diabetes mellitus without complications: Secondary | ICD-10-CM | POA: Diagnosis not present

## 2022-05-18 DIAGNOSIS — R52 Pain, unspecified: Secondary | ICD-10-CM | POA: Diagnosis not present

## 2022-05-18 DIAGNOSIS — D631 Anemia in chronic kidney disease: Secondary | ICD-10-CM | POA: Diagnosis not present

## 2022-05-18 DIAGNOSIS — L299 Pruritus, unspecified: Secondary | ICD-10-CM | POA: Diagnosis not present

## 2022-05-19 DIAGNOSIS — E119 Type 2 diabetes mellitus without complications: Secondary | ICD-10-CM | POA: Diagnosis not present

## 2022-05-19 DIAGNOSIS — R52 Pain, unspecified: Secondary | ICD-10-CM | POA: Diagnosis not present

## 2022-05-19 DIAGNOSIS — L299 Pruritus, unspecified: Secondary | ICD-10-CM | POA: Diagnosis not present

## 2022-05-19 DIAGNOSIS — D631 Anemia in chronic kidney disease: Secondary | ICD-10-CM | POA: Diagnosis not present

## 2022-05-19 DIAGNOSIS — N2581 Secondary hyperparathyroidism of renal origin: Secondary | ICD-10-CM | POA: Diagnosis not present

## 2022-05-19 DIAGNOSIS — Z992 Dependence on renal dialysis: Secondary | ICD-10-CM | POA: Diagnosis not present

## 2022-05-19 DIAGNOSIS — N186 End stage renal disease: Secondary | ICD-10-CM | POA: Diagnosis not present

## 2022-05-19 DIAGNOSIS — Z23 Encounter for immunization: Secondary | ICD-10-CM | POA: Diagnosis not present

## 2022-05-19 DIAGNOSIS — R519 Headache, unspecified: Secondary | ICD-10-CM | POA: Diagnosis not present

## 2022-05-21 DIAGNOSIS — D631 Anemia in chronic kidney disease: Secondary | ICD-10-CM | POA: Diagnosis not present

## 2022-05-21 DIAGNOSIS — E119 Type 2 diabetes mellitus without complications: Secondary | ICD-10-CM | POA: Diagnosis not present

## 2022-05-21 DIAGNOSIS — Z992 Dependence on renal dialysis: Secondary | ICD-10-CM | POA: Diagnosis not present

## 2022-05-21 DIAGNOSIS — N2581 Secondary hyperparathyroidism of renal origin: Secondary | ICD-10-CM | POA: Diagnosis not present

## 2022-05-21 DIAGNOSIS — L299 Pruritus, unspecified: Secondary | ICD-10-CM | POA: Diagnosis not present

## 2022-05-21 DIAGNOSIS — N186 End stage renal disease: Secondary | ICD-10-CM | POA: Diagnosis not present

## 2022-05-21 DIAGNOSIS — Z23 Encounter for immunization: Secondary | ICD-10-CM | POA: Diagnosis not present

## 2022-05-21 DIAGNOSIS — R519 Headache, unspecified: Secondary | ICD-10-CM | POA: Diagnosis not present

## 2022-05-21 DIAGNOSIS — R52 Pain, unspecified: Secondary | ICD-10-CM | POA: Diagnosis not present

## 2022-05-24 DIAGNOSIS — E119 Type 2 diabetes mellitus without complications: Secondary | ICD-10-CM | POA: Diagnosis not present

## 2022-05-24 DIAGNOSIS — L299 Pruritus, unspecified: Secondary | ICD-10-CM | POA: Diagnosis not present

## 2022-05-24 DIAGNOSIS — Z992 Dependence on renal dialysis: Secondary | ICD-10-CM | POA: Diagnosis not present

## 2022-05-24 DIAGNOSIS — R52 Pain, unspecified: Secondary | ICD-10-CM | POA: Diagnosis not present

## 2022-05-24 DIAGNOSIS — N2581 Secondary hyperparathyroidism of renal origin: Secondary | ICD-10-CM | POA: Diagnosis not present

## 2022-05-24 DIAGNOSIS — N186 End stage renal disease: Secondary | ICD-10-CM | POA: Diagnosis not present

## 2022-05-24 DIAGNOSIS — Z23 Encounter for immunization: Secondary | ICD-10-CM | POA: Diagnosis not present

## 2022-05-24 DIAGNOSIS — D631 Anemia in chronic kidney disease: Secondary | ICD-10-CM | POA: Diagnosis not present

## 2022-05-24 DIAGNOSIS — R519 Headache, unspecified: Secondary | ICD-10-CM | POA: Diagnosis not present

## 2022-05-27 DIAGNOSIS — L299 Pruritus, unspecified: Secondary | ICD-10-CM | POA: Diagnosis not present

## 2022-05-27 DIAGNOSIS — N186 End stage renal disease: Secondary | ICD-10-CM | POA: Diagnosis not present

## 2022-05-27 DIAGNOSIS — R519 Headache, unspecified: Secondary | ICD-10-CM | POA: Diagnosis not present

## 2022-05-27 DIAGNOSIS — N2581 Secondary hyperparathyroidism of renal origin: Secondary | ICD-10-CM | POA: Diagnosis not present

## 2022-05-27 DIAGNOSIS — R52 Pain, unspecified: Secondary | ICD-10-CM | POA: Diagnosis not present

## 2022-05-27 DIAGNOSIS — D631 Anemia in chronic kidney disease: Secondary | ICD-10-CM | POA: Diagnosis not present

## 2022-05-27 DIAGNOSIS — E119 Type 2 diabetes mellitus without complications: Secondary | ICD-10-CM | POA: Diagnosis not present

## 2022-05-27 DIAGNOSIS — Z23 Encounter for immunization: Secondary | ICD-10-CM | POA: Diagnosis not present

## 2022-05-27 DIAGNOSIS — Z992 Dependence on renal dialysis: Secondary | ICD-10-CM | POA: Diagnosis not present

## 2022-05-29 DIAGNOSIS — Z992 Dependence on renal dialysis: Secondary | ICD-10-CM | POA: Diagnosis not present

## 2022-05-29 DIAGNOSIS — N186 End stage renal disease: Secondary | ICD-10-CM | POA: Diagnosis not present

## 2022-05-31 DIAGNOSIS — Z992 Dependence on renal dialysis: Secondary | ICD-10-CM | POA: Diagnosis not present

## 2022-05-31 DIAGNOSIS — N033 Chronic nephritic syndrome with diffuse mesangial proliferative glomerulonephritis: Secondary | ICD-10-CM | POA: Diagnosis not present

## 2022-05-31 DIAGNOSIS — N186 End stage renal disease: Secondary | ICD-10-CM | POA: Diagnosis not present

## 2022-06-01 DIAGNOSIS — R519 Headache, unspecified: Secondary | ICD-10-CM | POA: Diagnosis not present

## 2022-06-01 DIAGNOSIS — N2581 Secondary hyperparathyroidism of renal origin: Secondary | ICD-10-CM | POA: Diagnosis not present

## 2022-06-01 DIAGNOSIS — L299 Pruritus, unspecified: Secondary | ICD-10-CM | POA: Diagnosis not present

## 2022-06-01 DIAGNOSIS — D631 Anemia in chronic kidney disease: Secondary | ICD-10-CM | POA: Diagnosis not present

## 2022-06-01 DIAGNOSIS — D509 Iron deficiency anemia, unspecified: Secondary | ICD-10-CM | POA: Diagnosis not present

## 2022-06-01 DIAGNOSIS — N186 End stage renal disease: Secondary | ICD-10-CM | POA: Diagnosis not present

## 2022-06-01 DIAGNOSIS — Z992 Dependence on renal dialysis: Secondary | ICD-10-CM | POA: Diagnosis not present

## 2022-06-02 ENCOUNTER — Ambulatory Visit: Payer: Medicare Other | Admitting: Nurse Practitioner

## 2022-06-02 DIAGNOSIS — R519 Headache, unspecified: Secondary | ICD-10-CM | POA: Diagnosis not present

## 2022-06-02 DIAGNOSIS — L299 Pruritus, unspecified: Secondary | ICD-10-CM | POA: Diagnosis not present

## 2022-06-02 DIAGNOSIS — Z992 Dependence on renal dialysis: Secondary | ICD-10-CM | POA: Diagnosis not present

## 2022-06-02 DIAGNOSIS — N186 End stage renal disease: Secondary | ICD-10-CM | POA: Diagnosis not present

## 2022-06-02 DIAGNOSIS — D509 Iron deficiency anemia, unspecified: Secondary | ICD-10-CM | POA: Diagnosis not present

## 2022-06-02 DIAGNOSIS — N2581 Secondary hyperparathyroidism of renal origin: Secondary | ICD-10-CM | POA: Diagnosis not present

## 2022-06-02 DIAGNOSIS — D631 Anemia in chronic kidney disease: Secondary | ICD-10-CM | POA: Diagnosis not present

## 2022-06-02 NOTE — Progress Notes (Unsigned)
Established patient visit   Patient: [name]   DOB: 1971/08/17   51 y.o. Female  MRN: [id number] Visit Date: 06/03/2022   No chief complaint on file.  Subjective    HPI  ***   Medications: Outpatient Medications Prior to Visit  Medication Sig   acetaminophen (TYLENOL) 325 MG tablet Take 2 tablets (650 mg total) by mouth every 8 (eight) hours as needed for mild pain, fever or headache.   albuterol (PROVENTIL HFA;VENTOLIN HFA) 108 (90 Base) MCG/ACT inhaler Inhale 2 puffs into the lungs every 6 (six) hours as needed for shortness of breath.   ALPRAZolam (XANAX) 1 MG tablet Take 1 tablet (1 mg total) by mouth 3 (three) times daily.   amLODipine (NORVASC) 10 MG tablet Take 10 mg by mouth at bedtime.    budesonide-formoterol (SYMBICORT) 160-4.5 MCG/ACT inhaler Inhale 2 puffs into the lungs 2 (two) times daily.   busPIRone (BUSPAR) 10 MG tablet TAKE 1 TABLET BY MOUTH THREE TIMES A DAY (Patient taking differently: Take 10 mg by mouth 3 (three) times daily.)   carvedilol (COREG) 12.5 MG tablet Take 12.5 mg by mouth 2 (two) times daily.   citalopram (CELEXA) 20 MG tablet Take 20 mg by mouth daily.   cyclobenzaprine (FLEXERIL) 10 MG tablet Take 20 mg by mouth 2 (two) times daily as needed for muscle spasms (takes at bedtime).   diphenhydrAMINE (BENADRYL) 25 mg capsule Take 25 mg by mouth every 6 (six) hours as needed for itching or allergies.   DULoxetine (CYMBALTA) 20 MG capsule TAKE 1 CAPSULE BY MOUTH EVERY DAY (Patient taking differently: Take 20 mg by mouth daily.)   furosemide (LASIX) 80 MG tablet Take 160 mg by mouth daily.   hydrALAZINE (APRESOLINE) 50 MG tablet Take 75 mg by mouth 3 (three) times daily.   HYDROmorphone (DILAUDID) 2 MG tablet Take 2 mg by mouth in the morning, at noon, in the evening, and at bedtime.   hydrOXYzine (VISTARIL) 25 MG capsule Take 25 mg by mouth 3 (three) times daily.   Lanthanum Carbonate (FOSRENOL) 1000 MG PACK Take 1,000 mg by mouth daily  after breakfast. Mix with applesauce   lidocaine-prilocaine (EMLA) cream Apply 1 application topically See admin instructions. Apply topically one hour prior to dialysis - Monday, Wednesday, Friday   lisinopril (PRINIVIL,ZESTRIL) 40 MG tablet Take 40 mg by mouth daily.   Melatonin 5 MG TABS Take 5 mg by mouth at bedtime.    methocarbamol (ROBAXIN) 500 MG tablet Take 500 mg by mouth 2 (two) times daily.   montelukast (SINGULAIR) 10 MG tablet Take 1 tablet (10 mg total) by mouth daily.   multivitamin (RENA-VIT) TABS tablet Take 1 tablet by mouth daily.   mupirocin ointment (BACTROBAN) 2 % Apply te effected areas twice daily (Patient taking differently: Apply 1 application. topically 2 (two) times daily as needed (wounds).)   naloxone (NARCAN) nasal spray 4 mg/0.1 mL Place 1 spray into the nose daily as needed (overdose).   ondansetron (ZOFRAN) 4 MG tablet Take 4 mg by mouth daily.   ondansetron (ZOFRAN-ODT) 4 MG disintegrating tablet Take 4 mg by mouth every 8 (eight) hours as needed for nausea/vomiting.   promethazine (PHENERGAN) 25 MG tablet Take 1 tablet (25 mg total) by mouth 2 (two) times daily as needed for nausea or vomiting.   sevelamer carbonate (RENVELA) 800 MG tablet Take 1,600-2,400 mg by mouth daily as needed (when eating). Take 3 tablets with meals and 2 tablets with snacks   SUMAtriptan (IMITREX)  50 MG tablet Take 1 tablet (50 mg total) by mouth daily as needed for migraine or headache.   No facility-administered medications prior to visit.    Review of Systems  {Labs (Optional):23779}   Objective    LMP 06/27/2014  BP Readings from Last 3 Encounters:  04/10/22 140/77  02/23/22 (!) 187/100  12/31/21 (!) 145/81    Wt Readings from Last 3 Encounters:  04/09/22 158 lb 1.1 oz (71.7 kg)  02/23/22 176 lb 3.2 oz (79.9 kg)  12/31/21 177 lb 12.8 oz (80.6 kg)    Physical Exam  ***  No results found for any visits on 06/03/22.  Assessment & Plan     Problem List Items  Addressed This Visit   None    No follow-ups on file.         Ronnell Freshwater, NP  [facility] at [facility] [phone] (phone) [phone] (fax)  [place]

## 2022-06-02 NOTE — Progress Notes (Deleted)
Established patient visit   Patient: [name]   DOB: April 09, 1971   51 y.o. Female  MRN: [id number] Visit Date: 06/02/2022   No chief complaint on file.  Subjective    HPI  Follow up for mood -started on cymbalta at last visit.  -takes buspirone as needed  -prescription for alprazolam 1 mg TID prn.  -will discuss lowering dose medication as she is on high doses pain medication as well.  -will have her sign controlled substance policy today  -reviewed PDMP profile today.   -end stage renal disease. She is on hemodialysis.    Medications: Outpatient Medications Prior to Visit  Medication Sig   acetaminophen (TYLENOL) 325 MG tablet Take 2 tablets (650 mg total) by mouth every 8 (eight) hours as needed for mild pain, fever or headache.   albuterol (PROVENTIL HFA;VENTOLIN HFA) 108 (90 Base) MCG/ACT inhaler Inhale 2 puffs into the lungs every 6 (six) hours as needed for shortness of breath.   ALPRAZolam (XANAX) 1 MG tablet Take 1 tablet (1 mg total) by mouth 3 (three) times daily.   amLODipine (NORVASC) 10 MG tablet Take 10 mg by mouth at bedtime.    budesonide-formoterol (SYMBICORT) 160-4.5 MCG/ACT inhaler Inhale 2 puffs into the lungs 2 (two) times daily.   busPIRone (BUSPAR) 10 MG tablet TAKE 1 TABLET BY MOUTH THREE TIMES A DAY (Patient taking differently: Take 10 mg by mouth 3 (three) times daily.)   carvedilol (COREG) 12.5 MG tablet Take 12.5 mg by mouth 2 (two) times daily.   citalopram (CELEXA) 20 MG tablet Take 20 mg by mouth daily.   cyclobenzaprine (FLEXERIL) 10 MG tablet Take 20 mg by mouth 2 (two) times daily as needed for muscle spasms (takes at bedtime).   diphenhydrAMINE (BENADRYL) 25 mg capsule Take 25 mg by mouth every 6 (six) hours as needed for itching or allergies.   DULoxetine (CYMBALTA) 20 MG capsule TAKE 1 CAPSULE BY MOUTH EVERY DAY (Patient taking differently: Take 20 mg by mouth daily.)   furosemide (LASIX) 80 MG tablet Take 160 mg by mouth daily.    hydrALAZINE (APRESOLINE) 50 MG tablet Take 75 mg by mouth 3 (three) times daily.   HYDROmorphone (DILAUDID) 2 MG tablet Take 2 mg by mouth in the morning, at noon, in the evening, and at bedtime.   hydrOXYzine (VISTARIL) 25 MG capsule Take 25 mg by mouth 3 (three) times daily.   Lanthanum Carbonate (FOSRENOL) 1000 MG PACK Take 1,000 mg by mouth daily after breakfast. Mix with applesauce   lidocaine-prilocaine (EMLA) cream Apply 1 application topically See admin instructions. Apply topically one hour prior to dialysis - Monday, Wednesday, Friday   lisinopril (PRINIVIL,ZESTRIL) 40 MG tablet Take 40 mg by mouth daily.   Melatonin 5 MG TABS Take 5 mg by mouth at bedtime.    methocarbamol (ROBAXIN) 500 MG tablet Take 500 mg by mouth 2 (two) times daily.   montelukast (SINGULAIR) 10 MG tablet Take 1 tablet (10 mg total) by mouth daily.   multivitamin (RENA-VIT) TABS tablet Take 1 tablet by mouth daily.   mupirocin ointment (BACTROBAN) 2 % Apply te effected areas twice daily (Patient taking differently: Apply 1 application. topically 2 (two) times daily as needed (wounds).)   naloxone (NARCAN) nasal spray 4 mg/0.1 mL Place 1 spray into the nose daily as needed (overdose).   ondansetron (ZOFRAN) 4 MG tablet Take 4 mg by mouth daily.   ondansetron (ZOFRAN-ODT) 4 MG disintegrating tablet Take 4 mg by mouth every 8 (  eight) hours as needed for nausea/vomiting.   promethazine (PHENERGAN) 25 MG tablet Take 1 tablet (25 mg total) by mouth 2 (two) times daily as needed for nausea or vomiting.   sevelamer carbonate (RENVELA) 800 MG tablet Take 1,600-2,400 mg by mouth daily as needed (when eating). Take 3 tablets with meals and 2 tablets with snacks   SUMAtriptan (IMITREX) 50 MG tablet Take 1 tablet (50 mg total) by mouth daily as needed for migraine or headache.   No facility-administered medications prior to visit.    Review of Systems  {Labs (Optional):23779}   Objective    LMP 06/27/2014  BP Readings  from Last 3 Encounters:  04/10/22 140/77  02/23/22 (!) 187/100  12/31/21 (!) 145/81    Wt Readings from Last 3 Encounters:  04/09/22 158 lb 1.1 oz (71.7 kg)  02/23/22 176 lb 3.2 oz (79.9 kg)  12/31/21 177 lb 12.8 oz (80.6 kg)    Physical Exam  ***  No results found for any visits on 06/02/22.  Assessment & Plan     Problem List Items Addressed This Visit   None    No follow-ups on file.         Ronnell Freshwater, NP  [facility] at [facility] [phone] (phone) [phone] (fax)  [place]

## 2022-06-03 ENCOUNTER — Encounter: Payer: Self-pay | Admitting: Nurse Practitioner

## 2022-06-03 ENCOUNTER — Ambulatory Visit (INDEPENDENT_AMBULATORY_CARE_PROVIDER_SITE_OTHER): Payer: Medicare Other | Admitting: Nurse Practitioner

## 2022-06-03 VITALS — BP 158/93 | HR 83 | Temp 98.7°F | Ht 63.0 in | Wt 163.0 lb

## 2022-06-03 DIAGNOSIS — N186 End stage renal disease: Secondary | ICD-10-CM | POA: Diagnosis not present

## 2022-06-03 DIAGNOSIS — Z992 Dependence on renal dialysis: Secondary | ICD-10-CM

## 2022-06-03 DIAGNOSIS — M064 Inflammatory polyarthropathy: Secondary | ICD-10-CM | POA: Diagnosis not present

## 2022-06-03 DIAGNOSIS — F411 Generalized anxiety disorder: Secondary | ICD-10-CM | POA: Diagnosis not present

## 2022-06-03 DIAGNOSIS — I10 Essential (primary) hypertension: Secondary | ICD-10-CM | POA: Diagnosis not present

## 2022-06-03 DIAGNOSIS — G43109 Migraine with aura, not intractable, without status migrainosus: Secondary | ICD-10-CM | POA: Diagnosis not present

## 2022-06-03 DIAGNOSIS — F321 Major depressive disorder, single episode, moderate: Secondary | ICD-10-CM

## 2022-06-03 MED ORDER — ALPRAZOLAM 1 MG PO TABS: 1.0000 mg | ORAL_TABLET | Freq: Two times a day (BID) | ORAL | 2 refills | Status: DC | PRN

## 2022-06-03 MED ORDER — DULOXETINE HCL 20 MG PO CPEP: 20.0000 mg | ORAL_CAPSULE | Freq: Two times a day (BID) | ORAL | 2 refills | Status: DC

## 2022-06-03 MED ORDER — SUMATRIPTAN SUCCINATE 100 MG PO TABS: 100.0000 mg | ORAL_TABLET | Freq: Every day | ORAL | 3 refills | Status: DC | PRN

## 2022-06-03 MED ORDER — BUSPIRONE HCL 15 MG PO TABS: 15.0000 mg | ORAL_TABLET | Freq: Three times a day (TID) | ORAL | 2 refills | Status: DC

## 2022-06-03 NOTE — Patient Instructions (Signed)
Increase duloxetine to twice daily  Wean off citalopram. Take every other day for one week. Then take every third day for two cycles. Then take every fourth day for two cycles. Then discontinue use of citalopram.  Increase buspirone to 15 mg three times daily as needed for anxiety  Decrease alprazolam to twice daily as needed for acute anxiety  Increase sumatriptan to 100 mg as needed  Follow up in three months

## 2022-06-04 DIAGNOSIS — N186 End stage renal disease: Secondary | ICD-10-CM | POA: Diagnosis not present

## 2022-06-04 DIAGNOSIS — R519 Headache, unspecified: Secondary | ICD-10-CM | POA: Diagnosis not present

## 2022-06-04 DIAGNOSIS — N2581 Secondary hyperparathyroidism of renal origin: Secondary | ICD-10-CM | POA: Diagnosis not present

## 2022-06-04 DIAGNOSIS — L299 Pruritus, unspecified: Secondary | ICD-10-CM | POA: Diagnosis not present

## 2022-06-04 DIAGNOSIS — Z992 Dependence on renal dialysis: Secondary | ICD-10-CM | POA: Diagnosis not present

## 2022-06-04 DIAGNOSIS — D509 Iron deficiency anemia, unspecified: Secondary | ICD-10-CM | POA: Diagnosis not present

## 2022-06-04 DIAGNOSIS — D631 Anemia in chronic kidney disease: Secondary | ICD-10-CM | POA: Diagnosis not present

## 2022-06-08 DIAGNOSIS — D509 Iron deficiency anemia, unspecified: Secondary | ICD-10-CM | POA: Diagnosis not present

## 2022-06-08 DIAGNOSIS — N186 End stage renal disease: Secondary | ICD-10-CM | POA: Diagnosis not present

## 2022-06-08 DIAGNOSIS — R519 Headache, unspecified: Secondary | ICD-10-CM | POA: Diagnosis not present

## 2022-06-08 DIAGNOSIS — Z992 Dependence on renal dialysis: Secondary | ICD-10-CM | POA: Diagnosis not present

## 2022-06-08 DIAGNOSIS — D631 Anemia in chronic kidney disease: Secondary | ICD-10-CM | POA: Diagnosis not present

## 2022-06-08 DIAGNOSIS — N2581 Secondary hyperparathyroidism of renal origin: Secondary | ICD-10-CM | POA: Diagnosis not present

## 2022-06-08 DIAGNOSIS — L299 Pruritus, unspecified: Secondary | ICD-10-CM | POA: Diagnosis not present

## 2022-06-09 DIAGNOSIS — Z992 Dependence on renal dialysis: Secondary | ICD-10-CM | POA: Diagnosis not present

## 2022-06-09 DIAGNOSIS — L299 Pruritus, unspecified: Secondary | ICD-10-CM | POA: Diagnosis not present

## 2022-06-09 DIAGNOSIS — D509 Iron deficiency anemia, unspecified: Secondary | ICD-10-CM | POA: Diagnosis not present

## 2022-06-09 DIAGNOSIS — R519 Headache, unspecified: Secondary | ICD-10-CM | POA: Diagnosis not present

## 2022-06-09 DIAGNOSIS — N2581 Secondary hyperparathyroidism of renal origin: Secondary | ICD-10-CM | POA: Diagnosis not present

## 2022-06-09 DIAGNOSIS — N186 End stage renal disease: Secondary | ICD-10-CM | POA: Diagnosis not present

## 2022-06-09 DIAGNOSIS — D631 Anemia in chronic kidney disease: Secondary | ICD-10-CM | POA: Diagnosis not present

## 2022-06-11 DIAGNOSIS — L299 Pruritus, unspecified: Secondary | ICD-10-CM | POA: Diagnosis not present

## 2022-06-11 DIAGNOSIS — D509 Iron deficiency anemia, unspecified: Secondary | ICD-10-CM | POA: Diagnosis not present

## 2022-06-11 DIAGNOSIS — N186 End stage renal disease: Secondary | ICD-10-CM | POA: Diagnosis not present

## 2022-06-11 DIAGNOSIS — R519 Headache, unspecified: Secondary | ICD-10-CM | POA: Diagnosis not present

## 2022-06-11 DIAGNOSIS — Z992 Dependence on renal dialysis: Secondary | ICD-10-CM | POA: Diagnosis not present

## 2022-06-11 DIAGNOSIS — D631 Anemia in chronic kidney disease: Secondary | ICD-10-CM | POA: Diagnosis not present

## 2022-06-11 DIAGNOSIS — N2581 Secondary hyperparathyroidism of renal origin: Secondary | ICD-10-CM | POA: Diagnosis not present

## 2022-06-14 DIAGNOSIS — N186 End stage renal disease: Secondary | ICD-10-CM | POA: Diagnosis not present

## 2022-06-14 DIAGNOSIS — M47812 Spondylosis without myelopathy or radiculopathy, cervical region: Secondary | ICD-10-CM | POA: Diagnosis not present

## 2022-06-14 DIAGNOSIS — M25511 Pain in right shoulder: Secondary | ICD-10-CM | POA: Diagnosis not present

## 2022-06-14 DIAGNOSIS — M5418 Radiculopathy, sacral and sacrococcygeal region: Secondary | ICD-10-CM | POA: Diagnosis not present

## 2022-06-14 DIAGNOSIS — D509 Iron deficiency anemia, unspecified: Secondary | ICD-10-CM | POA: Diagnosis not present

## 2022-06-14 DIAGNOSIS — G8929 Other chronic pain: Secondary | ICD-10-CM | POA: Diagnosis not present

## 2022-06-14 DIAGNOSIS — L299 Pruritus, unspecified: Secondary | ICD-10-CM | POA: Diagnosis not present

## 2022-06-14 DIAGNOSIS — M533 Sacrococcygeal disorders, not elsewhere classified: Secondary | ICD-10-CM | POA: Diagnosis not present

## 2022-06-14 DIAGNOSIS — M25561 Pain in right knee: Secondary | ICD-10-CM | POA: Diagnosis not present

## 2022-06-14 DIAGNOSIS — Z992 Dependence on renal dialysis: Secondary | ICD-10-CM | POA: Diagnosis not present

## 2022-06-14 DIAGNOSIS — D631 Anemia in chronic kidney disease: Secondary | ICD-10-CM | POA: Diagnosis not present

## 2022-06-14 DIAGNOSIS — N2581 Secondary hyperparathyroidism of renal origin: Secondary | ICD-10-CM | POA: Diagnosis not present

## 2022-06-14 DIAGNOSIS — Z5181 Encounter for therapeutic drug level monitoring: Secondary | ICD-10-CM | POA: Diagnosis not present

## 2022-06-14 DIAGNOSIS — M5417 Radiculopathy, lumbosacral region: Secondary | ICD-10-CM | POA: Diagnosis not present

## 2022-06-14 DIAGNOSIS — Z79899 Other long term (current) drug therapy: Secondary | ICD-10-CM | POA: Diagnosis not present

## 2022-06-14 DIAGNOSIS — R519 Headache, unspecified: Secondary | ICD-10-CM | POA: Diagnosis not present

## 2022-06-18 DIAGNOSIS — D631 Anemia in chronic kidney disease: Secondary | ICD-10-CM | POA: Diagnosis not present

## 2022-06-18 DIAGNOSIS — Z992 Dependence on renal dialysis: Secondary | ICD-10-CM | POA: Diagnosis not present

## 2022-06-18 DIAGNOSIS — L299 Pruritus, unspecified: Secondary | ICD-10-CM | POA: Diagnosis not present

## 2022-06-18 DIAGNOSIS — N186 End stage renal disease: Secondary | ICD-10-CM | POA: Diagnosis not present

## 2022-06-18 DIAGNOSIS — N2581 Secondary hyperparathyroidism of renal origin: Secondary | ICD-10-CM | POA: Diagnosis not present

## 2022-06-18 DIAGNOSIS — D509 Iron deficiency anemia, unspecified: Secondary | ICD-10-CM | POA: Diagnosis not present

## 2022-06-18 DIAGNOSIS — R519 Headache, unspecified: Secondary | ICD-10-CM | POA: Diagnosis not present

## 2022-06-19 DIAGNOSIS — R519 Headache, unspecified: Secondary | ICD-10-CM | POA: Diagnosis not present

## 2022-06-19 DIAGNOSIS — N2581 Secondary hyperparathyroidism of renal origin: Secondary | ICD-10-CM | POA: Diagnosis not present

## 2022-06-19 DIAGNOSIS — L299 Pruritus, unspecified: Secondary | ICD-10-CM | POA: Diagnosis not present

## 2022-06-19 DIAGNOSIS — D509 Iron deficiency anemia, unspecified: Secondary | ICD-10-CM | POA: Diagnosis not present

## 2022-06-19 DIAGNOSIS — Z992 Dependence on renal dialysis: Secondary | ICD-10-CM | POA: Diagnosis not present

## 2022-06-19 DIAGNOSIS — D631 Anemia in chronic kidney disease: Secondary | ICD-10-CM | POA: Diagnosis not present

## 2022-06-19 DIAGNOSIS — N186 End stage renal disease: Secondary | ICD-10-CM | POA: Diagnosis not present

## 2022-06-21 DIAGNOSIS — L299 Pruritus, unspecified: Secondary | ICD-10-CM | POA: Diagnosis not present

## 2022-06-21 DIAGNOSIS — Z992 Dependence on renal dialysis: Secondary | ICD-10-CM | POA: Diagnosis not present

## 2022-06-21 DIAGNOSIS — D631 Anemia in chronic kidney disease: Secondary | ICD-10-CM | POA: Diagnosis not present

## 2022-06-21 DIAGNOSIS — N186 End stage renal disease: Secondary | ICD-10-CM | POA: Diagnosis not present

## 2022-06-21 DIAGNOSIS — N2581 Secondary hyperparathyroidism of renal origin: Secondary | ICD-10-CM | POA: Diagnosis not present

## 2022-06-21 DIAGNOSIS — R519 Headache, unspecified: Secondary | ICD-10-CM | POA: Diagnosis not present

## 2022-06-21 DIAGNOSIS — D509 Iron deficiency anemia, unspecified: Secondary | ICD-10-CM | POA: Diagnosis not present

## 2022-06-23 DIAGNOSIS — N186 End stage renal disease: Secondary | ICD-10-CM | POA: Diagnosis not present

## 2022-06-23 DIAGNOSIS — D509 Iron deficiency anemia, unspecified: Secondary | ICD-10-CM | POA: Diagnosis not present

## 2022-06-23 DIAGNOSIS — Z992 Dependence on renal dialysis: Secondary | ICD-10-CM | POA: Diagnosis not present

## 2022-06-23 DIAGNOSIS — N2581 Secondary hyperparathyroidism of renal origin: Secondary | ICD-10-CM | POA: Diagnosis not present

## 2022-06-23 DIAGNOSIS — R519 Headache, unspecified: Secondary | ICD-10-CM | POA: Diagnosis not present

## 2022-06-23 DIAGNOSIS — L299 Pruritus, unspecified: Secondary | ICD-10-CM | POA: Diagnosis not present

## 2022-06-23 DIAGNOSIS — D631 Anemia in chronic kidney disease: Secondary | ICD-10-CM | POA: Diagnosis not present

## 2022-06-25 DIAGNOSIS — N186 End stage renal disease: Secondary | ICD-10-CM | POA: Diagnosis not present

## 2022-06-25 DIAGNOSIS — R519 Headache, unspecified: Secondary | ICD-10-CM | POA: Diagnosis not present

## 2022-06-25 DIAGNOSIS — L299 Pruritus, unspecified: Secondary | ICD-10-CM | POA: Diagnosis not present

## 2022-06-25 DIAGNOSIS — N2581 Secondary hyperparathyroidism of renal origin: Secondary | ICD-10-CM | POA: Diagnosis not present

## 2022-06-25 DIAGNOSIS — Z992 Dependence on renal dialysis: Secondary | ICD-10-CM | POA: Diagnosis not present

## 2022-06-25 DIAGNOSIS — D509 Iron deficiency anemia, unspecified: Secondary | ICD-10-CM | POA: Diagnosis not present

## 2022-06-25 DIAGNOSIS — D631 Anemia in chronic kidney disease: Secondary | ICD-10-CM | POA: Diagnosis not present

## 2022-06-26 ENCOUNTER — Other Ambulatory Visit: Payer: Self-pay | Admitting: Nurse Practitioner

## 2022-06-26 DIAGNOSIS — M064 Inflammatory polyarthropathy: Secondary | ICD-10-CM

## 2022-06-26 DIAGNOSIS — F411 Generalized anxiety disorder: Secondary | ICD-10-CM

## 2022-06-26 DIAGNOSIS — F321 Major depressive disorder, single episode, moderate: Secondary | ICD-10-CM

## 2022-06-30 DIAGNOSIS — L299 Pruritus, unspecified: Secondary | ICD-10-CM | POA: Diagnosis not present

## 2022-06-30 DIAGNOSIS — N186 End stage renal disease: Secondary | ICD-10-CM | POA: Diagnosis not present

## 2022-06-30 DIAGNOSIS — Z992 Dependence on renal dialysis: Secondary | ICD-10-CM | POA: Diagnosis not present

## 2022-06-30 DIAGNOSIS — D631 Anemia in chronic kidney disease: Secondary | ICD-10-CM | POA: Diagnosis not present

## 2022-06-30 DIAGNOSIS — R519 Headache, unspecified: Secondary | ICD-10-CM | POA: Diagnosis not present

## 2022-06-30 DIAGNOSIS — N2581 Secondary hyperparathyroidism of renal origin: Secondary | ICD-10-CM | POA: Diagnosis not present

## 2022-06-30 DIAGNOSIS — D509 Iron deficiency anemia, unspecified: Secondary | ICD-10-CM | POA: Diagnosis not present

## 2022-07-01 DIAGNOSIS — Z992 Dependence on renal dialysis: Secondary | ICD-10-CM | POA: Diagnosis not present

## 2022-07-01 DIAGNOSIS — N033 Chronic nephritic syndrome with diffuse mesangial proliferative glomerulonephritis: Secondary | ICD-10-CM | POA: Diagnosis not present

## 2022-07-01 DIAGNOSIS — N186 End stage renal disease: Secondary | ICD-10-CM | POA: Diagnosis not present

## 2022-07-02 DIAGNOSIS — D509 Iron deficiency anemia, unspecified: Secondary | ICD-10-CM | POA: Diagnosis not present

## 2022-07-02 DIAGNOSIS — Z992 Dependence on renal dialysis: Secondary | ICD-10-CM | POA: Diagnosis not present

## 2022-07-02 DIAGNOSIS — R52 Pain, unspecified: Secondary | ICD-10-CM | POA: Diagnosis not present

## 2022-07-02 DIAGNOSIS — R519 Headache, unspecified: Secondary | ICD-10-CM | POA: Diagnosis not present

## 2022-07-02 DIAGNOSIS — N186 End stage renal disease: Secondary | ICD-10-CM | POA: Diagnosis not present

## 2022-07-02 DIAGNOSIS — Z23 Encounter for immunization: Secondary | ICD-10-CM | POA: Diagnosis not present

## 2022-07-02 DIAGNOSIS — L299 Pruritus, unspecified: Secondary | ICD-10-CM | POA: Diagnosis not present

## 2022-07-02 DIAGNOSIS — D631 Anemia in chronic kidney disease: Secondary | ICD-10-CM | POA: Diagnosis not present

## 2022-07-02 DIAGNOSIS — N2581 Secondary hyperparathyroidism of renal origin: Secondary | ICD-10-CM | POA: Diagnosis not present

## 2022-07-05 DIAGNOSIS — D509 Iron deficiency anemia, unspecified: Secondary | ICD-10-CM | POA: Diagnosis not present

## 2022-07-05 DIAGNOSIS — R519 Headache, unspecified: Secondary | ICD-10-CM | POA: Diagnosis not present

## 2022-07-05 DIAGNOSIS — Z992 Dependence on renal dialysis: Secondary | ICD-10-CM | POA: Diagnosis not present

## 2022-07-05 DIAGNOSIS — R52 Pain, unspecified: Secondary | ICD-10-CM | POA: Diagnosis not present

## 2022-07-05 DIAGNOSIS — N2581 Secondary hyperparathyroidism of renal origin: Secondary | ICD-10-CM | POA: Diagnosis not present

## 2022-07-05 DIAGNOSIS — Z23 Encounter for immunization: Secondary | ICD-10-CM | POA: Diagnosis not present

## 2022-07-05 DIAGNOSIS — L299 Pruritus, unspecified: Secondary | ICD-10-CM | POA: Diagnosis not present

## 2022-07-05 DIAGNOSIS — D631 Anemia in chronic kidney disease: Secondary | ICD-10-CM | POA: Diagnosis not present

## 2022-07-05 DIAGNOSIS — N186 End stage renal disease: Secondary | ICD-10-CM | POA: Diagnosis not present

## 2022-07-09 DIAGNOSIS — N2581 Secondary hyperparathyroidism of renal origin: Secondary | ICD-10-CM | POA: Diagnosis not present

## 2022-07-09 DIAGNOSIS — Z23 Encounter for immunization: Secondary | ICD-10-CM | POA: Diagnosis not present

## 2022-07-09 DIAGNOSIS — L299 Pruritus, unspecified: Secondary | ICD-10-CM | POA: Diagnosis not present

## 2022-07-09 DIAGNOSIS — R52 Pain, unspecified: Secondary | ICD-10-CM | POA: Diagnosis not present

## 2022-07-09 DIAGNOSIS — R519 Headache, unspecified: Secondary | ICD-10-CM | POA: Diagnosis not present

## 2022-07-09 DIAGNOSIS — D509 Iron deficiency anemia, unspecified: Secondary | ICD-10-CM | POA: Diagnosis not present

## 2022-07-09 DIAGNOSIS — D631 Anemia in chronic kidney disease: Secondary | ICD-10-CM | POA: Diagnosis not present

## 2022-07-09 DIAGNOSIS — N186 End stage renal disease: Secondary | ICD-10-CM | POA: Diagnosis not present

## 2022-07-09 DIAGNOSIS — Z992 Dependence on renal dialysis: Secondary | ICD-10-CM | POA: Diagnosis not present

## 2022-07-12 DIAGNOSIS — D631 Anemia in chronic kidney disease: Secondary | ICD-10-CM | POA: Diagnosis not present

## 2022-07-12 DIAGNOSIS — R52 Pain, unspecified: Secondary | ICD-10-CM | POA: Diagnosis not present

## 2022-07-12 DIAGNOSIS — R519 Headache, unspecified: Secondary | ICD-10-CM | POA: Diagnosis not present

## 2022-07-12 DIAGNOSIS — N186 End stage renal disease: Secondary | ICD-10-CM | POA: Diagnosis not present

## 2022-07-12 DIAGNOSIS — L299 Pruritus, unspecified: Secondary | ICD-10-CM | POA: Diagnosis not present

## 2022-07-12 DIAGNOSIS — Z23 Encounter for immunization: Secondary | ICD-10-CM | POA: Diagnosis not present

## 2022-07-12 DIAGNOSIS — D509 Iron deficiency anemia, unspecified: Secondary | ICD-10-CM | POA: Diagnosis not present

## 2022-07-12 DIAGNOSIS — Z992 Dependence on renal dialysis: Secondary | ICD-10-CM | POA: Diagnosis not present

## 2022-07-12 DIAGNOSIS — N2581 Secondary hyperparathyroidism of renal origin: Secondary | ICD-10-CM | POA: Diagnosis not present

## 2022-07-13 ENCOUNTER — Other Ambulatory Visit: Payer: Self-pay | Admitting: Nurse Practitioner

## 2022-07-14 DIAGNOSIS — R519 Headache, unspecified: Secondary | ICD-10-CM | POA: Diagnosis not present

## 2022-07-14 DIAGNOSIS — Z23 Encounter for immunization: Secondary | ICD-10-CM | POA: Diagnosis not present

## 2022-07-14 DIAGNOSIS — D509 Iron deficiency anemia, unspecified: Secondary | ICD-10-CM | POA: Diagnosis not present

## 2022-07-14 DIAGNOSIS — N2581 Secondary hyperparathyroidism of renal origin: Secondary | ICD-10-CM | POA: Diagnosis not present

## 2022-07-14 DIAGNOSIS — Z992 Dependence on renal dialysis: Secondary | ICD-10-CM | POA: Diagnosis not present

## 2022-07-14 DIAGNOSIS — R52 Pain, unspecified: Secondary | ICD-10-CM | POA: Diagnosis not present

## 2022-07-14 DIAGNOSIS — N186 End stage renal disease: Secondary | ICD-10-CM | POA: Diagnosis not present

## 2022-07-14 DIAGNOSIS — L299 Pruritus, unspecified: Secondary | ICD-10-CM | POA: Diagnosis not present

## 2022-07-14 DIAGNOSIS — D631 Anemia in chronic kidney disease: Secondary | ICD-10-CM | POA: Diagnosis not present

## 2022-07-16 DIAGNOSIS — R519 Headache, unspecified: Secondary | ICD-10-CM | POA: Diagnosis not present

## 2022-07-16 DIAGNOSIS — D509 Iron deficiency anemia, unspecified: Secondary | ICD-10-CM | POA: Diagnosis not present

## 2022-07-16 DIAGNOSIS — N186 End stage renal disease: Secondary | ICD-10-CM | POA: Diagnosis not present

## 2022-07-16 DIAGNOSIS — R52 Pain, unspecified: Secondary | ICD-10-CM | POA: Diagnosis not present

## 2022-07-16 DIAGNOSIS — Z992 Dependence on renal dialysis: Secondary | ICD-10-CM | POA: Diagnosis not present

## 2022-07-16 DIAGNOSIS — Z23 Encounter for immunization: Secondary | ICD-10-CM | POA: Diagnosis not present

## 2022-07-16 DIAGNOSIS — D631 Anemia in chronic kidney disease: Secondary | ICD-10-CM | POA: Diagnosis not present

## 2022-07-16 DIAGNOSIS — L299 Pruritus, unspecified: Secondary | ICD-10-CM | POA: Diagnosis not present

## 2022-07-16 DIAGNOSIS — N2581 Secondary hyperparathyroidism of renal origin: Secondary | ICD-10-CM | POA: Diagnosis not present

## 2022-07-19 DIAGNOSIS — R519 Headache, unspecified: Secondary | ICD-10-CM | POA: Diagnosis not present

## 2022-07-19 DIAGNOSIS — Z23 Encounter for immunization: Secondary | ICD-10-CM | POA: Diagnosis not present

## 2022-07-19 DIAGNOSIS — N186 End stage renal disease: Secondary | ICD-10-CM | POA: Diagnosis not present

## 2022-07-19 DIAGNOSIS — D631 Anemia in chronic kidney disease: Secondary | ICD-10-CM | POA: Diagnosis not present

## 2022-07-19 DIAGNOSIS — Z992 Dependence on renal dialysis: Secondary | ICD-10-CM | POA: Diagnosis not present

## 2022-07-19 DIAGNOSIS — N2581 Secondary hyperparathyroidism of renal origin: Secondary | ICD-10-CM | POA: Diagnosis not present

## 2022-07-19 DIAGNOSIS — R52 Pain, unspecified: Secondary | ICD-10-CM | POA: Diagnosis not present

## 2022-07-19 DIAGNOSIS — L299 Pruritus, unspecified: Secondary | ICD-10-CM | POA: Diagnosis not present

## 2022-07-19 DIAGNOSIS — D509 Iron deficiency anemia, unspecified: Secondary | ICD-10-CM | POA: Diagnosis not present

## 2022-07-22 DIAGNOSIS — D509 Iron deficiency anemia, unspecified: Secondary | ICD-10-CM | POA: Diagnosis not present

## 2022-07-22 DIAGNOSIS — R52 Pain, unspecified: Secondary | ICD-10-CM | POA: Diagnosis not present

## 2022-07-22 DIAGNOSIS — D631 Anemia in chronic kidney disease: Secondary | ICD-10-CM | POA: Diagnosis not present

## 2022-07-22 DIAGNOSIS — L299 Pruritus, unspecified: Secondary | ICD-10-CM | POA: Diagnosis not present

## 2022-07-22 DIAGNOSIS — N186 End stage renal disease: Secondary | ICD-10-CM | POA: Diagnosis not present

## 2022-07-22 DIAGNOSIS — N2581 Secondary hyperparathyroidism of renal origin: Secondary | ICD-10-CM | POA: Diagnosis not present

## 2022-07-22 DIAGNOSIS — R519 Headache, unspecified: Secondary | ICD-10-CM | POA: Diagnosis not present

## 2022-07-22 DIAGNOSIS — Z23 Encounter for immunization: Secondary | ICD-10-CM | POA: Diagnosis not present

## 2022-07-22 DIAGNOSIS — Z992 Dependence on renal dialysis: Secondary | ICD-10-CM | POA: Diagnosis not present

## 2022-07-23 DIAGNOSIS — N2581 Secondary hyperparathyroidism of renal origin: Secondary | ICD-10-CM | POA: Diagnosis not present

## 2022-07-23 DIAGNOSIS — L299 Pruritus, unspecified: Secondary | ICD-10-CM | POA: Diagnosis not present

## 2022-07-23 DIAGNOSIS — Z23 Encounter for immunization: Secondary | ICD-10-CM | POA: Diagnosis not present

## 2022-07-23 DIAGNOSIS — R52 Pain, unspecified: Secondary | ICD-10-CM | POA: Diagnosis not present

## 2022-07-23 DIAGNOSIS — N186 End stage renal disease: Secondary | ICD-10-CM | POA: Diagnosis not present

## 2022-07-23 DIAGNOSIS — D509 Iron deficiency anemia, unspecified: Secondary | ICD-10-CM | POA: Diagnosis not present

## 2022-07-23 DIAGNOSIS — R519 Headache, unspecified: Secondary | ICD-10-CM | POA: Diagnosis not present

## 2022-07-23 DIAGNOSIS — Z992 Dependence on renal dialysis: Secondary | ICD-10-CM | POA: Diagnosis not present

## 2022-07-23 DIAGNOSIS — D631 Anemia in chronic kidney disease: Secondary | ICD-10-CM | POA: Diagnosis not present

## 2022-07-25 ENCOUNTER — Other Ambulatory Visit: Payer: Self-pay | Admitting: Nurse Practitioner

## 2022-07-25 DIAGNOSIS — F411 Generalized anxiety disorder: Secondary | ICD-10-CM

## 2022-07-26 DIAGNOSIS — Z992 Dependence on renal dialysis: Secondary | ICD-10-CM | POA: Diagnosis not present

## 2022-07-26 DIAGNOSIS — N2581 Secondary hyperparathyroidism of renal origin: Secondary | ICD-10-CM | POA: Diagnosis not present

## 2022-07-26 DIAGNOSIS — L299 Pruritus, unspecified: Secondary | ICD-10-CM | POA: Diagnosis not present

## 2022-07-26 DIAGNOSIS — R52 Pain, unspecified: Secondary | ICD-10-CM | POA: Diagnosis not present

## 2022-07-26 DIAGNOSIS — D631 Anemia in chronic kidney disease: Secondary | ICD-10-CM | POA: Diagnosis not present

## 2022-07-26 DIAGNOSIS — R519 Headache, unspecified: Secondary | ICD-10-CM | POA: Diagnosis not present

## 2022-07-26 DIAGNOSIS — Z23 Encounter for immunization: Secondary | ICD-10-CM | POA: Diagnosis not present

## 2022-07-26 DIAGNOSIS — N186 End stage renal disease: Secondary | ICD-10-CM | POA: Diagnosis not present

## 2022-07-26 DIAGNOSIS — D509 Iron deficiency anemia, unspecified: Secondary | ICD-10-CM | POA: Diagnosis not present

## 2022-07-26 NOTE — Telephone Encounter (Signed)
Patient should have follow up visit scheduled for 09/2022.

## 2022-07-28 DIAGNOSIS — Z992 Dependence on renal dialysis: Secondary | ICD-10-CM | POA: Diagnosis not present

## 2022-07-28 DIAGNOSIS — R519 Headache, unspecified: Secondary | ICD-10-CM | POA: Diagnosis not present

## 2022-07-28 DIAGNOSIS — D631 Anemia in chronic kidney disease: Secondary | ICD-10-CM | POA: Diagnosis not present

## 2022-07-28 DIAGNOSIS — D509 Iron deficiency anemia, unspecified: Secondary | ICD-10-CM | POA: Diagnosis not present

## 2022-07-28 DIAGNOSIS — R52 Pain, unspecified: Secondary | ICD-10-CM | POA: Diagnosis not present

## 2022-07-28 DIAGNOSIS — N186 End stage renal disease: Secondary | ICD-10-CM | POA: Diagnosis not present

## 2022-07-28 DIAGNOSIS — L299 Pruritus, unspecified: Secondary | ICD-10-CM | POA: Diagnosis not present

## 2022-07-28 DIAGNOSIS — N2581 Secondary hyperparathyroidism of renal origin: Secondary | ICD-10-CM | POA: Diagnosis not present

## 2022-07-28 DIAGNOSIS — Z23 Encounter for immunization: Secondary | ICD-10-CM | POA: Diagnosis not present

## 2022-07-31 DIAGNOSIS — N033 Chronic nephritic syndrome with diffuse mesangial proliferative glomerulonephritis: Secondary | ICD-10-CM | POA: Diagnosis not present

## 2022-07-31 DIAGNOSIS — Z992 Dependence on renal dialysis: Secondary | ICD-10-CM | POA: Diagnosis not present

## 2022-07-31 DIAGNOSIS — N186 End stage renal disease: Secondary | ICD-10-CM | POA: Diagnosis not present

## 2022-08-02 DIAGNOSIS — L299 Pruritus, unspecified: Secondary | ICD-10-CM | POA: Diagnosis not present

## 2022-08-02 DIAGNOSIS — R519 Headache, unspecified: Secondary | ICD-10-CM | POA: Diagnosis not present

## 2022-08-02 DIAGNOSIS — E119 Type 2 diabetes mellitus without complications: Secondary | ICD-10-CM | POA: Diagnosis not present

## 2022-08-02 DIAGNOSIS — D509 Iron deficiency anemia, unspecified: Secondary | ICD-10-CM | POA: Diagnosis not present

## 2022-08-02 DIAGNOSIS — Z23 Encounter for immunization: Secondary | ICD-10-CM | POA: Diagnosis not present

## 2022-08-02 DIAGNOSIS — D631 Anemia in chronic kidney disease: Secondary | ICD-10-CM | POA: Diagnosis not present

## 2022-08-02 DIAGNOSIS — Z992 Dependence on renal dialysis: Secondary | ICD-10-CM | POA: Diagnosis not present

## 2022-08-02 DIAGNOSIS — N186 End stage renal disease: Secondary | ICD-10-CM | POA: Diagnosis not present

## 2022-08-02 DIAGNOSIS — R52 Pain, unspecified: Secondary | ICD-10-CM | POA: Diagnosis not present

## 2022-08-02 DIAGNOSIS — N2581 Secondary hyperparathyroidism of renal origin: Secondary | ICD-10-CM | POA: Diagnosis not present

## 2022-08-05 ENCOUNTER — Other Ambulatory Visit: Payer: Self-pay | Admitting: Nurse Practitioner

## 2022-08-05 DIAGNOSIS — D631 Anemia in chronic kidney disease: Secondary | ICD-10-CM | POA: Diagnosis not present

## 2022-08-05 DIAGNOSIS — N186 End stage renal disease: Secondary | ICD-10-CM | POA: Diagnosis not present

## 2022-08-05 DIAGNOSIS — D509 Iron deficiency anemia, unspecified: Secondary | ICD-10-CM | POA: Diagnosis not present

## 2022-08-05 DIAGNOSIS — E119 Type 2 diabetes mellitus without complications: Secondary | ICD-10-CM | POA: Diagnosis not present

## 2022-08-05 DIAGNOSIS — R52 Pain, unspecified: Secondary | ICD-10-CM | POA: Diagnosis not present

## 2022-08-05 DIAGNOSIS — N2581 Secondary hyperparathyroidism of renal origin: Secondary | ICD-10-CM | POA: Diagnosis not present

## 2022-08-05 DIAGNOSIS — R519 Headache, unspecified: Secondary | ICD-10-CM | POA: Diagnosis not present

## 2022-08-05 DIAGNOSIS — Z992 Dependence on renal dialysis: Secondary | ICD-10-CM | POA: Diagnosis not present

## 2022-08-05 DIAGNOSIS — Z23 Encounter for immunization: Secondary | ICD-10-CM | POA: Diagnosis not present

## 2022-08-05 DIAGNOSIS — L299 Pruritus, unspecified: Secondary | ICD-10-CM | POA: Diagnosis not present

## 2022-08-06 DIAGNOSIS — D631 Anemia in chronic kidney disease: Secondary | ICD-10-CM | POA: Diagnosis not present

## 2022-08-06 DIAGNOSIS — N2581 Secondary hyperparathyroidism of renal origin: Secondary | ICD-10-CM | POA: Diagnosis not present

## 2022-08-06 DIAGNOSIS — N186 End stage renal disease: Secondary | ICD-10-CM | POA: Diagnosis not present

## 2022-08-06 DIAGNOSIS — L299 Pruritus, unspecified: Secondary | ICD-10-CM | POA: Diagnosis not present

## 2022-08-06 DIAGNOSIS — Z23 Encounter for immunization: Secondary | ICD-10-CM | POA: Diagnosis not present

## 2022-08-06 DIAGNOSIS — Z992 Dependence on renal dialysis: Secondary | ICD-10-CM | POA: Diagnosis not present

## 2022-08-06 DIAGNOSIS — R52 Pain, unspecified: Secondary | ICD-10-CM | POA: Diagnosis not present

## 2022-08-06 DIAGNOSIS — E119 Type 2 diabetes mellitus without complications: Secondary | ICD-10-CM | POA: Diagnosis not present

## 2022-08-06 DIAGNOSIS — R519 Headache, unspecified: Secondary | ICD-10-CM | POA: Diagnosis not present

## 2022-08-06 DIAGNOSIS — D509 Iron deficiency anemia, unspecified: Secondary | ICD-10-CM | POA: Diagnosis not present

## 2022-08-09 DIAGNOSIS — Z23 Encounter for immunization: Secondary | ICD-10-CM | POA: Diagnosis not present

## 2022-08-09 DIAGNOSIS — N2581 Secondary hyperparathyroidism of renal origin: Secondary | ICD-10-CM | POA: Diagnosis not present

## 2022-08-09 DIAGNOSIS — D631 Anemia in chronic kidney disease: Secondary | ICD-10-CM | POA: Diagnosis not present

## 2022-08-09 DIAGNOSIS — Z992 Dependence on renal dialysis: Secondary | ICD-10-CM | POA: Diagnosis not present

## 2022-08-09 DIAGNOSIS — L299 Pruritus, unspecified: Secondary | ICD-10-CM | POA: Diagnosis not present

## 2022-08-09 DIAGNOSIS — N186 End stage renal disease: Secondary | ICD-10-CM | POA: Diagnosis not present

## 2022-08-09 DIAGNOSIS — R52 Pain, unspecified: Secondary | ICD-10-CM | POA: Diagnosis not present

## 2022-08-09 DIAGNOSIS — D509 Iron deficiency anemia, unspecified: Secondary | ICD-10-CM | POA: Diagnosis not present

## 2022-08-09 DIAGNOSIS — R519 Headache, unspecified: Secondary | ICD-10-CM | POA: Diagnosis not present

## 2022-08-09 DIAGNOSIS — E119 Type 2 diabetes mellitus without complications: Secondary | ICD-10-CM | POA: Diagnosis not present

## 2022-08-10 ENCOUNTER — Other Ambulatory Visit: Payer: Self-pay | Admitting: Nurse Practitioner

## 2022-08-10 DIAGNOSIS — M5417 Radiculopathy, lumbosacral region: Secondary | ICD-10-CM | POA: Diagnosis not present

## 2022-08-10 DIAGNOSIS — M25561 Pain in right knee: Secondary | ICD-10-CM | POA: Diagnosis not present

## 2022-08-10 DIAGNOSIS — M47812 Spondylosis without myelopathy or radiculopathy, cervical region: Secondary | ICD-10-CM | POA: Diagnosis not present

## 2022-08-10 DIAGNOSIS — G8929 Other chronic pain: Secondary | ICD-10-CM | POA: Diagnosis not present

## 2022-08-10 DIAGNOSIS — M533 Sacrococcygeal disorders, not elsewhere classified: Secondary | ICD-10-CM | POA: Diagnosis not present

## 2022-08-10 DIAGNOSIS — Z5181 Encounter for therapeutic drug level monitoring: Secondary | ICD-10-CM | POA: Diagnosis not present

## 2022-08-10 DIAGNOSIS — M5418 Radiculopathy, sacral and sacrococcygeal region: Secondary | ICD-10-CM | POA: Diagnosis not present

## 2022-08-10 DIAGNOSIS — Z79899 Other long term (current) drug therapy: Secondary | ICD-10-CM | POA: Diagnosis not present

## 2022-08-10 DIAGNOSIS — G43109 Migraine with aura, not intractable, without status migrainosus: Secondary | ICD-10-CM

## 2022-08-11 DIAGNOSIS — Z23 Encounter for immunization: Secondary | ICD-10-CM | POA: Diagnosis not present

## 2022-08-11 DIAGNOSIS — N186 End stage renal disease: Secondary | ICD-10-CM | POA: Diagnosis not present

## 2022-08-11 DIAGNOSIS — Z992 Dependence on renal dialysis: Secondary | ICD-10-CM | POA: Diagnosis not present

## 2022-08-11 DIAGNOSIS — D509 Iron deficiency anemia, unspecified: Secondary | ICD-10-CM | POA: Diagnosis not present

## 2022-08-11 DIAGNOSIS — E119 Type 2 diabetes mellitus without complications: Secondary | ICD-10-CM | POA: Diagnosis not present

## 2022-08-11 DIAGNOSIS — R519 Headache, unspecified: Secondary | ICD-10-CM | POA: Diagnosis not present

## 2022-08-11 DIAGNOSIS — D631 Anemia in chronic kidney disease: Secondary | ICD-10-CM | POA: Diagnosis not present

## 2022-08-11 DIAGNOSIS — L299 Pruritus, unspecified: Secondary | ICD-10-CM | POA: Diagnosis not present

## 2022-08-11 DIAGNOSIS — N2581 Secondary hyperparathyroidism of renal origin: Secondary | ICD-10-CM | POA: Diagnosis not present

## 2022-08-11 DIAGNOSIS — R52 Pain, unspecified: Secondary | ICD-10-CM | POA: Diagnosis not present

## 2022-08-13 DIAGNOSIS — Z992 Dependence on renal dialysis: Secondary | ICD-10-CM | POA: Diagnosis not present

## 2022-08-13 DIAGNOSIS — L299 Pruritus, unspecified: Secondary | ICD-10-CM | POA: Diagnosis not present

## 2022-08-13 DIAGNOSIS — R52 Pain, unspecified: Secondary | ICD-10-CM | POA: Diagnosis not present

## 2022-08-13 DIAGNOSIS — N2581 Secondary hyperparathyroidism of renal origin: Secondary | ICD-10-CM | POA: Diagnosis not present

## 2022-08-13 DIAGNOSIS — Z23 Encounter for immunization: Secondary | ICD-10-CM | POA: Diagnosis not present

## 2022-08-13 DIAGNOSIS — D631 Anemia in chronic kidney disease: Secondary | ICD-10-CM | POA: Diagnosis not present

## 2022-08-13 DIAGNOSIS — R519 Headache, unspecified: Secondary | ICD-10-CM | POA: Diagnosis not present

## 2022-08-13 DIAGNOSIS — E119 Type 2 diabetes mellitus without complications: Secondary | ICD-10-CM | POA: Diagnosis not present

## 2022-08-13 DIAGNOSIS — N186 End stage renal disease: Secondary | ICD-10-CM | POA: Diagnosis not present

## 2022-08-13 DIAGNOSIS — D509 Iron deficiency anemia, unspecified: Secondary | ICD-10-CM | POA: Diagnosis not present

## 2022-08-16 DIAGNOSIS — R519 Headache, unspecified: Secondary | ICD-10-CM | POA: Diagnosis not present

## 2022-08-16 DIAGNOSIS — E119 Type 2 diabetes mellitus without complications: Secondary | ICD-10-CM | POA: Diagnosis not present

## 2022-08-16 DIAGNOSIS — D509 Iron deficiency anemia, unspecified: Secondary | ICD-10-CM | POA: Diagnosis not present

## 2022-08-16 DIAGNOSIS — Z23 Encounter for immunization: Secondary | ICD-10-CM | POA: Diagnosis not present

## 2022-08-16 DIAGNOSIS — D631 Anemia in chronic kidney disease: Secondary | ICD-10-CM | POA: Diagnosis not present

## 2022-08-16 DIAGNOSIS — N186 End stage renal disease: Secondary | ICD-10-CM | POA: Diagnosis not present

## 2022-08-16 DIAGNOSIS — L299 Pruritus, unspecified: Secondary | ICD-10-CM | POA: Diagnosis not present

## 2022-08-16 DIAGNOSIS — Z992 Dependence on renal dialysis: Secondary | ICD-10-CM | POA: Diagnosis not present

## 2022-08-16 DIAGNOSIS — N2581 Secondary hyperparathyroidism of renal origin: Secondary | ICD-10-CM | POA: Diagnosis not present

## 2022-08-16 DIAGNOSIS — R52 Pain, unspecified: Secondary | ICD-10-CM | POA: Diagnosis not present

## 2022-08-19 DIAGNOSIS — D631 Anemia in chronic kidney disease: Secondary | ICD-10-CM | POA: Diagnosis not present

## 2022-08-19 DIAGNOSIS — D509 Iron deficiency anemia, unspecified: Secondary | ICD-10-CM | POA: Diagnosis not present

## 2022-08-19 DIAGNOSIS — E119 Type 2 diabetes mellitus without complications: Secondary | ICD-10-CM | POA: Diagnosis not present

## 2022-08-19 DIAGNOSIS — R52 Pain, unspecified: Secondary | ICD-10-CM | POA: Diagnosis not present

## 2022-08-19 DIAGNOSIS — L299 Pruritus, unspecified: Secondary | ICD-10-CM | POA: Diagnosis not present

## 2022-08-19 DIAGNOSIS — N2581 Secondary hyperparathyroidism of renal origin: Secondary | ICD-10-CM | POA: Diagnosis not present

## 2022-08-19 DIAGNOSIS — N186 End stage renal disease: Secondary | ICD-10-CM | POA: Diagnosis not present

## 2022-08-19 DIAGNOSIS — R519 Headache, unspecified: Secondary | ICD-10-CM | POA: Diagnosis not present

## 2022-08-19 DIAGNOSIS — Z992 Dependence on renal dialysis: Secondary | ICD-10-CM | POA: Diagnosis not present

## 2022-08-19 DIAGNOSIS — Z23 Encounter for immunization: Secondary | ICD-10-CM | POA: Diagnosis not present

## 2022-08-20 DIAGNOSIS — Z992 Dependence on renal dialysis: Secondary | ICD-10-CM | POA: Diagnosis not present

## 2022-08-20 DIAGNOSIS — R519 Headache, unspecified: Secondary | ICD-10-CM | POA: Diagnosis not present

## 2022-08-20 DIAGNOSIS — L299 Pruritus, unspecified: Secondary | ICD-10-CM | POA: Diagnosis not present

## 2022-08-20 DIAGNOSIS — Z23 Encounter for immunization: Secondary | ICD-10-CM | POA: Diagnosis not present

## 2022-08-20 DIAGNOSIS — D631 Anemia in chronic kidney disease: Secondary | ICD-10-CM | POA: Diagnosis not present

## 2022-08-20 DIAGNOSIS — N2581 Secondary hyperparathyroidism of renal origin: Secondary | ICD-10-CM | POA: Diagnosis not present

## 2022-08-20 DIAGNOSIS — R52 Pain, unspecified: Secondary | ICD-10-CM | POA: Diagnosis not present

## 2022-08-20 DIAGNOSIS — E119 Type 2 diabetes mellitus without complications: Secondary | ICD-10-CM | POA: Diagnosis not present

## 2022-08-20 DIAGNOSIS — N186 End stage renal disease: Secondary | ICD-10-CM | POA: Diagnosis not present

## 2022-08-20 DIAGNOSIS — D509 Iron deficiency anemia, unspecified: Secondary | ICD-10-CM | POA: Diagnosis not present

## 2022-08-23 ENCOUNTER — Other Ambulatory Visit: Payer: Self-pay | Admitting: Nurse Practitioner

## 2022-08-23 DIAGNOSIS — M064 Inflammatory polyarthropathy: Secondary | ICD-10-CM

## 2022-08-23 DIAGNOSIS — F321 Major depressive disorder, single episode, moderate: Secondary | ICD-10-CM

## 2022-08-24 DIAGNOSIS — D631 Anemia in chronic kidney disease: Secondary | ICD-10-CM | POA: Diagnosis not present

## 2022-08-24 DIAGNOSIS — R519 Headache, unspecified: Secondary | ICD-10-CM | POA: Diagnosis not present

## 2022-08-24 DIAGNOSIS — D509 Iron deficiency anemia, unspecified: Secondary | ICD-10-CM | POA: Diagnosis not present

## 2022-08-24 DIAGNOSIS — N2581 Secondary hyperparathyroidism of renal origin: Secondary | ICD-10-CM | POA: Diagnosis not present

## 2022-08-24 DIAGNOSIS — R52 Pain, unspecified: Secondary | ICD-10-CM | POA: Diagnosis not present

## 2022-08-24 DIAGNOSIS — L299 Pruritus, unspecified: Secondary | ICD-10-CM | POA: Diagnosis not present

## 2022-08-24 DIAGNOSIS — Z992 Dependence on renal dialysis: Secondary | ICD-10-CM | POA: Diagnosis not present

## 2022-08-24 DIAGNOSIS — N186 End stage renal disease: Secondary | ICD-10-CM | POA: Diagnosis not present

## 2022-08-24 DIAGNOSIS — Z23 Encounter for immunization: Secondary | ICD-10-CM | POA: Diagnosis not present

## 2022-08-24 DIAGNOSIS — E119 Type 2 diabetes mellitus without complications: Secondary | ICD-10-CM | POA: Diagnosis not present

## 2022-08-25 DIAGNOSIS — Z23 Encounter for immunization: Secondary | ICD-10-CM | POA: Diagnosis not present

## 2022-08-25 DIAGNOSIS — R52 Pain, unspecified: Secondary | ICD-10-CM | POA: Diagnosis not present

## 2022-08-25 DIAGNOSIS — Z992 Dependence on renal dialysis: Secondary | ICD-10-CM | POA: Diagnosis not present

## 2022-08-25 DIAGNOSIS — N186 End stage renal disease: Secondary | ICD-10-CM | POA: Diagnosis not present

## 2022-08-25 DIAGNOSIS — E119 Type 2 diabetes mellitus without complications: Secondary | ICD-10-CM | POA: Diagnosis not present

## 2022-08-25 DIAGNOSIS — L299 Pruritus, unspecified: Secondary | ICD-10-CM | POA: Diagnosis not present

## 2022-08-25 DIAGNOSIS — N2581 Secondary hyperparathyroidism of renal origin: Secondary | ICD-10-CM | POA: Diagnosis not present

## 2022-08-25 DIAGNOSIS — D509 Iron deficiency anemia, unspecified: Secondary | ICD-10-CM | POA: Diagnosis not present

## 2022-08-25 DIAGNOSIS — R519 Headache, unspecified: Secondary | ICD-10-CM | POA: Diagnosis not present

## 2022-08-25 DIAGNOSIS — D631 Anemia in chronic kidney disease: Secondary | ICD-10-CM | POA: Diagnosis not present

## 2022-08-30 DIAGNOSIS — E119 Type 2 diabetes mellitus without complications: Secondary | ICD-10-CM | POA: Diagnosis not present

## 2022-08-30 DIAGNOSIS — L299 Pruritus, unspecified: Secondary | ICD-10-CM | POA: Diagnosis not present

## 2022-08-30 DIAGNOSIS — D509 Iron deficiency anemia, unspecified: Secondary | ICD-10-CM | POA: Diagnosis not present

## 2022-08-30 DIAGNOSIS — Z23 Encounter for immunization: Secondary | ICD-10-CM | POA: Diagnosis not present

## 2022-08-30 DIAGNOSIS — N2581 Secondary hyperparathyroidism of renal origin: Secondary | ICD-10-CM | POA: Diagnosis not present

## 2022-08-30 DIAGNOSIS — R52 Pain, unspecified: Secondary | ICD-10-CM | POA: Diagnosis not present

## 2022-08-30 DIAGNOSIS — Z992 Dependence on renal dialysis: Secondary | ICD-10-CM | POA: Diagnosis not present

## 2022-08-30 DIAGNOSIS — N186 End stage renal disease: Secondary | ICD-10-CM | POA: Diagnosis not present

## 2022-08-30 DIAGNOSIS — R519 Headache, unspecified: Secondary | ICD-10-CM | POA: Diagnosis not present

## 2022-08-30 DIAGNOSIS — D631 Anemia in chronic kidney disease: Secondary | ICD-10-CM | POA: Diagnosis not present

## 2022-08-31 ENCOUNTER — Other Ambulatory Visit: Payer: Self-pay | Admitting: Nurse Practitioner

## 2022-08-31 DIAGNOSIS — Z992 Dependence on renal dialysis: Secondary | ICD-10-CM | POA: Diagnosis not present

## 2022-08-31 DIAGNOSIS — F411 Generalized anxiety disorder: Secondary | ICD-10-CM

## 2022-08-31 DIAGNOSIS — N033 Chronic nephritic syndrome with diffuse mesangial proliferative glomerulonephritis: Secondary | ICD-10-CM | POA: Diagnosis not present

## 2022-08-31 DIAGNOSIS — N186 End stage renal disease: Secondary | ICD-10-CM | POA: Diagnosis not present

## 2022-08-31 DIAGNOSIS — L03116 Cellulitis of left lower limb: Secondary | ICD-10-CM

## 2022-09-01 DIAGNOSIS — N186 End stage renal disease: Secondary | ICD-10-CM | POA: Diagnosis not present

## 2022-09-01 DIAGNOSIS — N033 Chronic nephritic syndrome with diffuse mesangial proliferative glomerulonephritis: Secondary | ICD-10-CM | POA: Diagnosis not present

## 2022-09-01 DIAGNOSIS — Z992 Dependence on renal dialysis: Secondary | ICD-10-CM | POA: Diagnosis not present

## 2022-09-02 DIAGNOSIS — N186 End stage renal disease: Secondary | ICD-10-CM | POA: Diagnosis not present

## 2022-09-02 DIAGNOSIS — D631 Anemia in chronic kidney disease: Secondary | ICD-10-CM | POA: Diagnosis not present

## 2022-09-02 DIAGNOSIS — Z992 Dependence on renal dialysis: Secondary | ICD-10-CM | POA: Diagnosis not present

## 2022-09-02 DIAGNOSIS — R519 Headache, unspecified: Secondary | ICD-10-CM | POA: Diagnosis not present

## 2022-09-02 DIAGNOSIS — N2581 Secondary hyperparathyroidism of renal origin: Secondary | ICD-10-CM | POA: Diagnosis not present

## 2022-09-02 DIAGNOSIS — L299 Pruritus, unspecified: Secondary | ICD-10-CM | POA: Diagnosis not present

## 2022-09-02 DIAGNOSIS — R52 Pain, unspecified: Secondary | ICD-10-CM | POA: Diagnosis not present

## 2022-09-03 DIAGNOSIS — N2581 Secondary hyperparathyroidism of renal origin: Secondary | ICD-10-CM | POA: Diagnosis not present

## 2022-09-03 DIAGNOSIS — Z992 Dependence on renal dialysis: Secondary | ICD-10-CM | POA: Diagnosis not present

## 2022-09-03 DIAGNOSIS — R519 Headache, unspecified: Secondary | ICD-10-CM | POA: Diagnosis not present

## 2022-09-03 DIAGNOSIS — L299 Pruritus, unspecified: Secondary | ICD-10-CM | POA: Diagnosis not present

## 2022-09-03 DIAGNOSIS — R52 Pain, unspecified: Secondary | ICD-10-CM | POA: Diagnosis not present

## 2022-09-03 DIAGNOSIS — N186 End stage renal disease: Secondary | ICD-10-CM | POA: Diagnosis not present

## 2022-09-03 DIAGNOSIS — D631 Anemia in chronic kidney disease: Secondary | ICD-10-CM | POA: Diagnosis not present

## 2022-09-05 NOTE — Progress Notes (Deleted)
Established patient visit   Patient: [name]   DOB: 1971-07-03   51 y.o. Female  MRN: [id number] Visit Date: 09/06/2022   No chief complaint on file.  Subjective    HPI  Follow up visit  --end stage renal disease and currently on hemodialysis.  -recently hospitalized due to hyperkalemia and SVT -prescribed high dose narcotic medications per orthopedic provider -came to our office from different provider who also was prescribing high dose benzodiazepine -will decrease alprazolam dosing to 1 mg twice daily as needed  -increase buspirone to 15 mg three times daily as needed.  -started cymbalta 20 mg daily. Will increase to twice daily and have her gradually wean off her citalopram.  -PDMP profile reviewed 09/05/2022  --overdose risk score 440 --has had multiple prescribers of opioid and sedative medications  --fill history for alprazolam is appropriate.    Medications: Outpatient Medications Prior to Visit  Medication Sig   acetaminophen (TYLENOL) 325 MG tablet Take 2 tablets (650 mg total) by mouth every 8 (eight) hours as needed for mild pain, fever or headache.   albuterol (PROVENTIL HFA;VENTOLIN HFA) 108 (90 Base) MCG/ACT inhaler Inhale 2 puffs into the lungs every 6 (six) hours as needed for shortness of breath.   ALPRAZolam (XANAX) 1 MG tablet TAKE 1 TABLET BY MOUTH 2 TIMES DAILY AS NEEDED FOR ANXIETY.   amLODipine (NORVASC) 10 MG tablet Take 10 mg by mouth at bedtime.    budesonide-formoterol (SYMBICORT) 160-4.5 MCG/ACT inhaler Inhale 2 puffs into the lungs 2 (two) times daily.   busPIRone (BUSPAR) 15 MG tablet TAKE 1 TABLET BY MOUTH 3 TIMES DAILY.   carvedilol (COREG) 12.5 MG tablet Take 12.5 mg by mouth 2 (two) times daily.   citalopram (CELEXA) 20 MG tablet Take 20 mg by mouth daily.   cyclobenzaprine (FLEXERIL) 10 MG tablet Take 20 mg by mouth 2 (two) times daily as needed for muscle spasms (takes at bedtime).   diphenhydrAMINE (BENADRYL) 25 mg capsule Take 25  mg by mouth every 6 (six) hours as needed for itching or allergies.   DULoxetine (CYMBALTA) 20 MG capsule TAKE 1 CAPSULE BY MOUTH TWICE A DAY   furosemide (LASIX) 80 MG tablet Take 160 mg by mouth daily.   hydrALAZINE (APRESOLINE) 50 MG tablet Take 75 mg by mouth 3 (three) times daily.   HYDROmorphone (DILAUDID) 2 MG tablet Take 2 mg by mouth in the morning, at noon, in the evening, and at bedtime.   hydrOXYzine (VISTARIL) 25 MG capsule Take 25 mg by mouth 3 (three) times daily.   Lanthanum Carbonate (FOSRENOL) 1000 MG PACK Take 1,000 mg by mouth daily after breakfast. Mix with applesauce   lidocaine-prilocaine (EMLA) cream Apply 1 application topically See admin instructions. Apply topically one hour prior to dialysis - Monday, Wednesday, Friday   lisinopril (ZESTRIL) 40 MG tablet TAKE 1 TABLET BY MOUTH EVERY DAY. NEEDS NEW DOCTOR. DOCTOR RETIRED.   Melatonin 5 MG TABS Take 5 mg by mouth at bedtime.    methocarbamol (ROBAXIN) 500 MG tablet Take 500 mg by mouth 2 (two) times daily.   montelukast (SINGULAIR) 10 MG tablet Take 1 tablet (10 mg total) by mouth daily.   multivitamin (RENA-VIT) TABS tablet Take 1 tablet by mouth daily.   mupirocin ointment (BACTROBAN) 2 % APPLY TO AFFECTED AREAS TWICE DAILY   naloxone (NARCAN) nasal spray 4 mg/0.1 mL Place 1 spray into the nose daily as needed (overdose).   ondansetron (ZOFRAN) 4 MG tablet Take 4 mg by  mouth daily.   ondansetron (ZOFRAN-ODT) 4 MG disintegrating tablet Take 4 mg by mouth every 8 (eight) hours as needed for nausea/vomiting.   promethazine (PHENERGAN) 25 MG tablet Take 1 tablet (25 mg total) by mouth 2 (two) times daily as needed for nausea or vomiting.   sevelamer carbonate (RENVELA) 800 MG tablet Take 1,600-2,400 mg by mouth daily as needed (when eating). Take 3 tablets with meals and 2 tablets with snacks   SUMAtriptan (IMITREX) 50 MG tablet TAKE 1 TABLET (50 MG TOTAL) BY MOUTH DAILY AS NEEDED FOR MIGRAINE OR HEADACHE.   No  facility-administered medications prior to visit.    Review of Systems  {Labs (Optional):23779}   Objective    There were no vitals filed for this visit. There is no height or weight on file to calculate BMI.  BP Readings from Last 3 Encounters:  06/03/22 (Abnormal) 158/93  04/10/22 140/77  02/23/22 (Abnormal) 187/100    Wt Readings from Last 3 Encounters:  06/03/22 163 lb (73.9 kg)  04/09/22 158 lb 1.1 oz (71.7 kg)  02/23/22 176 lb 3.2 oz (79.9 kg)    Physical Exam  ***  No results found for any visits on 09/06/22.  Assessment & Plan     Problem List Items Addressed This Visit   None    No follow-ups on file.         Ronnell Freshwater, NP  [facility] at [place] [phone] (phone) [phone] (fax)  [place]

## 2022-09-06 ENCOUNTER — Ambulatory Visit: Payer: Medicare Other | Admitting: Nurse Practitioner

## 2022-09-07 DIAGNOSIS — R52 Pain, unspecified: Secondary | ICD-10-CM | POA: Diagnosis not present

## 2022-09-07 DIAGNOSIS — D631 Anemia in chronic kidney disease: Secondary | ICD-10-CM | POA: Diagnosis not present

## 2022-09-07 DIAGNOSIS — N2581 Secondary hyperparathyroidism of renal origin: Secondary | ICD-10-CM | POA: Diagnosis not present

## 2022-09-07 DIAGNOSIS — Z992 Dependence on renal dialysis: Secondary | ICD-10-CM | POA: Diagnosis not present

## 2022-09-07 DIAGNOSIS — N186 End stage renal disease: Secondary | ICD-10-CM | POA: Diagnosis not present

## 2022-09-07 DIAGNOSIS — L299 Pruritus, unspecified: Secondary | ICD-10-CM | POA: Diagnosis not present

## 2022-09-07 DIAGNOSIS — R519 Headache, unspecified: Secondary | ICD-10-CM | POA: Diagnosis not present

## 2022-09-08 DIAGNOSIS — L299 Pruritus, unspecified: Secondary | ICD-10-CM | POA: Diagnosis not present

## 2022-09-08 DIAGNOSIS — R519 Headache, unspecified: Secondary | ICD-10-CM | POA: Diagnosis not present

## 2022-09-08 DIAGNOSIS — Z992 Dependence on renal dialysis: Secondary | ICD-10-CM | POA: Diagnosis not present

## 2022-09-08 DIAGNOSIS — D631 Anemia in chronic kidney disease: Secondary | ICD-10-CM | POA: Diagnosis not present

## 2022-09-08 DIAGNOSIS — R52 Pain, unspecified: Secondary | ICD-10-CM | POA: Diagnosis not present

## 2022-09-08 DIAGNOSIS — N2581 Secondary hyperparathyroidism of renal origin: Secondary | ICD-10-CM | POA: Diagnosis not present

## 2022-09-08 DIAGNOSIS — N186 End stage renal disease: Secondary | ICD-10-CM | POA: Diagnosis not present

## 2022-09-09 DIAGNOSIS — M5417 Radiculopathy, lumbosacral region: Secondary | ICD-10-CM | POA: Diagnosis not present

## 2022-09-09 DIAGNOSIS — M533 Sacrococcygeal disorders, not elsewhere classified: Secondary | ICD-10-CM | POA: Diagnosis not present

## 2022-09-09 DIAGNOSIS — M5418 Radiculopathy, sacral and sacrococcygeal region: Secondary | ICD-10-CM | POA: Diagnosis not present

## 2022-09-09 DIAGNOSIS — M47812 Spondylosis without myelopathy or radiculopathy, cervical region: Secondary | ICD-10-CM | POA: Diagnosis not present

## 2022-09-10 DIAGNOSIS — N2581 Secondary hyperparathyroidism of renal origin: Secondary | ICD-10-CM | POA: Diagnosis not present

## 2022-09-10 DIAGNOSIS — N186 End stage renal disease: Secondary | ICD-10-CM | POA: Diagnosis not present

## 2022-09-10 DIAGNOSIS — L299 Pruritus, unspecified: Secondary | ICD-10-CM | POA: Diagnosis not present

## 2022-09-10 DIAGNOSIS — R52 Pain, unspecified: Secondary | ICD-10-CM | POA: Diagnosis not present

## 2022-09-10 DIAGNOSIS — D631 Anemia in chronic kidney disease: Secondary | ICD-10-CM | POA: Diagnosis not present

## 2022-09-10 DIAGNOSIS — Z992 Dependence on renal dialysis: Secondary | ICD-10-CM | POA: Diagnosis not present

## 2022-09-10 DIAGNOSIS — R519 Headache, unspecified: Secondary | ICD-10-CM | POA: Diagnosis not present

## 2022-09-14 DIAGNOSIS — Z992 Dependence on renal dialysis: Secondary | ICD-10-CM | POA: Diagnosis not present

## 2022-09-14 DIAGNOSIS — R52 Pain, unspecified: Secondary | ICD-10-CM | POA: Diagnosis not present

## 2022-09-14 DIAGNOSIS — N186 End stage renal disease: Secondary | ICD-10-CM | POA: Diagnosis not present

## 2022-09-14 DIAGNOSIS — L299 Pruritus, unspecified: Secondary | ICD-10-CM | POA: Diagnosis not present

## 2022-09-14 DIAGNOSIS — D631 Anemia in chronic kidney disease: Secondary | ICD-10-CM | POA: Diagnosis not present

## 2022-09-14 DIAGNOSIS — R519 Headache, unspecified: Secondary | ICD-10-CM | POA: Diagnosis not present

## 2022-09-14 DIAGNOSIS — N2581 Secondary hyperparathyroidism of renal origin: Secondary | ICD-10-CM | POA: Diagnosis not present

## 2022-09-15 DIAGNOSIS — L299 Pruritus, unspecified: Secondary | ICD-10-CM | POA: Diagnosis not present

## 2022-09-15 DIAGNOSIS — R52 Pain, unspecified: Secondary | ICD-10-CM | POA: Diagnosis not present

## 2022-09-15 DIAGNOSIS — Z992 Dependence on renal dialysis: Secondary | ICD-10-CM | POA: Diagnosis not present

## 2022-09-15 DIAGNOSIS — R519 Headache, unspecified: Secondary | ICD-10-CM | POA: Diagnosis not present

## 2022-09-15 DIAGNOSIS — D631 Anemia in chronic kidney disease: Secondary | ICD-10-CM | POA: Diagnosis not present

## 2022-09-15 DIAGNOSIS — N186 End stage renal disease: Secondary | ICD-10-CM | POA: Diagnosis not present

## 2022-09-15 DIAGNOSIS — N2581 Secondary hyperparathyroidism of renal origin: Secondary | ICD-10-CM | POA: Diagnosis not present

## 2022-09-17 DIAGNOSIS — N186 End stage renal disease: Secondary | ICD-10-CM | POA: Diagnosis not present

## 2022-09-17 DIAGNOSIS — R52 Pain, unspecified: Secondary | ICD-10-CM | POA: Diagnosis not present

## 2022-09-17 DIAGNOSIS — N2581 Secondary hyperparathyroidism of renal origin: Secondary | ICD-10-CM | POA: Diagnosis not present

## 2022-09-17 DIAGNOSIS — L299 Pruritus, unspecified: Secondary | ICD-10-CM | POA: Diagnosis not present

## 2022-09-17 DIAGNOSIS — Z992 Dependence on renal dialysis: Secondary | ICD-10-CM | POA: Diagnosis not present

## 2022-09-17 DIAGNOSIS — D631 Anemia in chronic kidney disease: Secondary | ICD-10-CM | POA: Diagnosis not present

## 2022-09-17 DIAGNOSIS — R519 Headache, unspecified: Secondary | ICD-10-CM | POA: Diagnosis not present

## 2022-09-20 DIAGNOSIS — N186 End stage renal disease: Secondary | ICD-10-CM | POA: Diagnosis not present

## 2022-09-20 DIAGNOSIS — N2581 Secondary hyperparathyroidism of renal origin: Secondary | ICD-10-CM | POA: Diagnosis not present

## 2022-09-20 DIAGNOSIS — R519 Headache, unspecified: Secondary | ICD-10-CM | POA: Diagnosis not present

## 2022-09-20 DIAGNOSIS — L299 Pruritus, unspecified: Secondary | ICD-10-CM | POA: Diagnosis not present

## 2022-09-20 DIAGNOSIS — R52 Pain, unspecified: Secondary | ICD-10-CM | POA: Diagnosis not present

## 2022-09-20 DIAGNOSIS — D631 Anemia in chronic kidney disease: Secondary | ICD-10-CM | POA: Diagnosis not present

## 2022-09-20 DIAGNOSIS — Z992 Dependence on renal dialysis: Secondary | ICD-10-CM | POA: Diagnosis not present

## 2022-09-22 DIAGNOSIS — D631 Anemia in chronic kidney disease: Secondary | ICD-10-CM | POA: Diagnosis not present

## 2022-09-22 DIAGNOSIS — Z992 Dependence on renal dialysis: Secondary | ICD-10-CM | POA: Diagnosis not present

## 2022-09-22 DIAGNOSIS — L299 Pruritus, unspecified: Secondary | ICD-10-CM | POA: Diagnosis not present

## 2022-09-22 DIAGNOSIS — R52 Pain, unspecified: Secondary | ICD-10-CM | POA: Diagnosis not present

## 2022-09-22 DIAGNOSIS — N2581 Secondary hyperparathyroidism of renal origin: Secondary | ICD-10-CM | POA: Diagnosis not present

## 2022-09-22 DIAGNOSIS — R519 Headache, unspecified: Secondary | ICD-10-CM | POA: Diagnosis not present

## 2022-09-22 DIAGNOSIS — N186 End stage renal disease: Secondary | ICD-10-CM | POA: Diagnosis not present

## 2022-09-24 ENCOUNTER — Other Ambulatory Visit: Payer: Self-pay | Admitting: Nurse Practitioner

## 2022-09-24 DIAGNOSIS — F321 Major depressive disorder, single episode, moderate: Secondary | ICD-10-CM

## 2022-09-24 DIAGNOSIS — M064 Inflammatory polyarthropathy: Secondary | ICD-10-CM

## 2022-09-26 ENCOUNTER — Other Ambulatory Visit: Payer: Self-pay | Admitting: Nurse Practitioner

## 2022-09-26 DIAGNOSIS — F411 Generalized anxiety disorder: Secondary | ICD-10-CM

## 2022-09-26 DIAGNOSIS — G43109 Migraine with aura, not intractable, without status migrainosus: Secondary | ICD-10-CM

## 2022-09-27 DIAGNOSIS — I469 Cardiac arrest, cause unspecified: Secondary | ICD-10-CM | POA: Diagnosis not present

## 2022-09-27 DIAGNOSIS — R0681 Apnea, not elsewhere classified: Secondary | ICD-10-CM | POA: Diagnosis not present

## 2022-09-27 DIAGNOSIS — R0902 Hypoxemia: Secondary | ICD-10-CM | POA: Diagnosis not present

## 2022-09-27 DIAGNOSIS — R55 Syncope and collapse: Secondary | ICD-10-CM | POA: Diagnosis not present

## 2022-09-27 DIAGNOSIS — I499 Cardiac arrhythmia, unspecified: Secondary | ICD-10-CM | POA: Diagnosis not present

## 2022-09-30 ENCOUNTER — Ambulatory Visit: Payer: Medicare Other | Admitting: Nurse Practitioner

## 2022-10-01 ENCOUNTER — Telehealth: Payer: Self-pay

## 2022-10-01 NOTE — Telephone Encounter (Signed)
[name] is calling from [place] [facility].   Needs signature for death certificate of [place].   Death Date  10-22-2022.  No other information given

## 2022-10-01 DEATH — deceased

## 2022-10-05 NOTE — Telephone Encounter (Signed)
Contacted [name] at [facility] to update her on the status and she said that someone from Health Department had emailed provider to see if she could help with this in some way.  Also she said if there is another doctor in this practice they could also sign. Wasn't sure if Dr. Suzi Roots would be an option if there is continued delay.Amun Stemm Zimmerman Rumple, CMA

## 2022-10-05 NOTE — Telephone Encounter (Signed)
Pt spouse calling to ask about the death certificate.  We informed him that provider had to register in the system to complete and we would get an update to see what the status of getting this done is currently. Zaeem Kandel Zimmerman Rumple, CMA

## 2022-10-05 NOTE — Telephone Encounter (Signed)
Thank you. As of yet, I have not received any approval for this

## 2022-10-06 NOTE — Telephone Encounter (Signed)
Hey Dr. Madilyn Fireman.  In regard to this patient, I have made new application to the [facility] system. Yesterday, someone from health department reached out to me saying I already had credentials, but they were expired because I never had to use the system. They told me someone from [facility] would be reaching out to me to supply me the credentials. I have not heard from that yet, I know they are a=waiting for this. I don;t know what to do.

## 2022-10-06 NOTE — Telephone Encounter (Signed)
Yes! I got the email about an hour after I sent this to you. I was able to get it completed with the help of Nakai. Thank goodness!

## 2022-10-06 NOTE — Telephone Encounter (Signed)
Dewey. DC has been signed.
